# Patient Record
Sex: Male | Born: 1960
Health system: Southern US, Community
[De-identification: ages and names within clinical notes are randomized; demographics above are authoritative.]

## PROBLEM LIST (undated history)

## (undated) DIAGNOSIS — R39198 Other difficulties with micturition: Secondary | ICD-10-CM

## (undated) DIAGNOSIS — K645 Perianal venous thrombosis: Secondary | ICD-10-CM

## (undated) DIAGNOSIS — K6289 Other specified diseases of anus and rectum: Secondary | ICD-10-CM

## (undated) DIAGNOSIS — K219 Gastro-esophageal reflux disease without esophagitis: Secondary | ICD-10-CM

## (undated) DIAGNOSIS — R0981 Nasal congestion: Secondary | ICD-10-CM

## (undated) DIAGNOSIS — T7840XA Allergy, unspecified, initial encounter: Secondary | ICD-10-CM

## (undated) DIAGNOSIS — M7989 Other specified soft tissue disorders: Secondary | ICD-10-CM

## (undated) DIAGNOSIS — K921 Melena: Secondary | ICD-10-CM

## (undated) DIAGNOSIS — G4730 Sleep apnea, unspecified: Secondary | ICD-10-CM

## (undated) DIAGNOSIS — G930 Cerebral cysts: Secondary | ICD-10-CM

## (undated) DIAGNOSIS — E785 Hyperlipidemia, unspecified: Secondary | ICD-10-CM

## (undated) DIAGNOSIS — K626 Ulcer of anus and rectum: Secondary | ICD-10-CM

## (undated) DIAGNOSIS — R112 Nausea with vomiting, unspecified: Secondary | ICD-10-CM

## (undated) DIAGNOSIS — M1990 Unspecified osteoarthritis, unspecified site: Secondary | ICD-10-CM

## (undated) DIAGNOSIS — K922 Gastrointestinal hemorrhage, unspecified: Secondary | ICD-10-CM

## (undated) DIAGNOSIS — R51 Headache: Secondary | ICD-10-CM

## (undated) DIAGNOSIS — I639 Cerebral infarction, unspecified: Secondary | ICD-10-CM

## (undated) DIAGNOSIS — Z9889 Other specified postprocedural states: Secondary | ICD-10-CM

## (undated) DIAGNOSIS — I10 Essential (primary) hypertension: Secondary | ICD-10-CM

## (undated) DIAGNOSIS — R1310 Dysphagia, unspecified: Secondary | ICD-10-CM

## (undated) DIAGNOSIS — E119 Type 2 diabetes mellitus without complications: Secondary | ICD-10-CM

## (undated) DIAGNOSIS — K5900 Constipation, unspecified: Secondary | ICD-10-CM

## (undated) DIAGNOSIS — H9190 Unspecified hearing loss, unspecified ear: Secondary | ICD-10-CM

## (undated) HISTORY — DX: Type 2 diabetes mellitus without complications: E11.9

## (undated) HISTORY — DX: Essential (primary) hypertension: I10

## (undated) HISTORY — DX: Unspecified osteoarthritis, unspecified site: M19.90

## (undated) HISTORY — DX: Gastro-esophageal reflux disease without esophagitis: K21.9

## (undated) HISTORY — DX: Dysphagia, unspecified: R13.10

## (undated) HISTORY — DX: Other specified diseases of anus and rectum: K62.89

## (undated) HISTORY — DX: Headache: R51

## (undated) HISTORY — DX: Melena: K92.1

## (undated) HISTORY — DX: Other specified soft tissue disorders: M79.89

## (undated) HISTORY — PX: OTHER SURGICAL HISTORY: SHX169

## (undated) HISTORY — DX: Nasal congestion: R09.81

## (undated) HISTORY — DX: Perianal venous thrombosis: K64.5

## (undated) HISTORY — PX: CATARACT EXTRACTION: SUR2

## (undated) HISTORY — DX: Cerebral infarction, unspecified: I63.9

## (undated) HISTORY — DX: Constipation, unspecified: K59.00

## (undated) HISTORY — DX: Unspecified hearing loss, unspecified ear: H91.90

## (undated) HISTORY — DX: Hyperlipidemia, unspecified: E78.5

## (undated) HISTORY — DX: Allergy, unspecified, initial encounter: T78.40XA

## (undated) HISTORY — DX: Gastrointestinal hemorrhage, unspecified: K92.2

## (undated) HISTORY — DX: Sleep apnea, unspecified: G47.30

## (undated) HISTORY — DX: Other difficulties with micturition: R39.198

## (undated) HISTORY — PX: EYE SURGERY: SHX253

## (undated) HISTORY — PX: INNER EAR SURGERY: SHX679

---

## 1990-06-16 HISTORY — PX: INNER EAR SURGERY: SHX679

## 2003-11-22 ENCOUNTER — Emergency Department (HOSPITAL_COMMUNITY): Admission: EM | Admit: 2003-11-22 | Discharge: 2003-11-22 | Payer: Self-pay | Admitting: Emergency Medicine

## 2003-12-01 ENCOUNTER — Ambulatory Visit (HOSPITAL_BASED_OUTPATIENT_CLINIC_OR_DEPARTMENT_OTHER): Admission: RE | Admit: 2003-12-01 | Discharge: 2003-12-01 | Payer: Self-pay | Admitting: Urology

## 2003-12-27 ENCOUNTER — Encounter: Admission: RE | Admit: 2003-12-27 | Discharge: 2003-12-27 | Payer: Self-pay | Admitting: Sports Medicine

## 2004-06-04 ENCOUNTER — Ambulatory Visit: Payer: Self-pay | Admitting: Family Medicine

## 2004-06-06 ENCOUNTER — Encounter: Admission: RE | Admit: 2004-06-06 | Discharge: 2004-06-06 | Payer: Self-pay | Admitting: Family Medicine

## 2004-09-03 ENCOUNTER — Ambulatory Visit: Payer: Self-pay | Admitting: Family Medicine

## 2004-12-12 ENCOUNTER — Ambulatory Visit: Payer: Self-pay | Admitting: Family Medicine

## 2005-02-06 ENCOUNTER — Ambulatory Visit: Payer: Self-pay | Admitting: Family Medicine

## 2005-03-27 ENCOUNTER — Ambulatory Visit: Payer: Self-pay | Admitting: Family Medicine

## 2005-04-09 ENCOUNTER — Ambulatory Visit: Payer: Self-pay | Admitting: Family Medicine

## 2005-05-01 ENCOUNTER — Ambulatory Visit: Payer: Self-pay | Admitting: Family Medicine

## 2005-06-26 ENCOUNTER — Ambulatory Visit: Payer: Self-pay | Admitting: Family Medicine

## 2005-06-26 ENCOUNTER — Ambulatory Visit (HOSPITAL_COMMUNITY): Admission: RE | Admit: 2005-06-26 | Discharge: 2005-06-26 | Payer: Self-pay | Admitting: Family Medicine

## 2005-06-30 ENCOUNTER — Ambulatory Visit (HOSPITAL_COMMUNITY): Admission: RE | Admit: 2005-06-30 | Discharge: 2005-06-30 | Payer: Self-pay | Admitting: Family Medicine

## 2005-08-14 ENCOUNTER — Ambulatory Visit (HOSPITAL_COMMUNITY): Admission: RE | Admit: 2005-08-14 | Discharge: 2005-08-14 | Payer: Self-pay | Admitting: Neurology

## 2005-10-09 ENCOUNTER — Ambulatory Visit (HOSPITAL_COMMUNITY): Admission: RE | Admit: 2005-10-09 | Discharge: 2005-10-09 | Payer: Self-pay | Admitting: Neurosurgery

## 2005-10-16 ENCOUNTER — Ambulatory Visit: Payer: Self-pay | Admitting: Family Medicine

## 2005-10-25 ENCOUNTER — Ambulatory Visit (HOSPITAL_COMMUNITY): Admission: RE | Admit: 2005-10-25 | Discharge: 2005-10-25 | Payer: Self-pay | Admitting: Neurology

## 2005-12-03 ENCOUNTER — Ambulatory Visit: Payer: Self-pay | Admitting: Family Medicine

## 2006-01-08 ENCOUNTER — Ambulatory Visit: Payer: Self-pay | Admitting: Family Medicine

## 2006-02-03 ENCOUNTER — Ambulatory Visit: Payer: Self-pay | Admitting: Family Medicine

## 2006-02-26 ENCOUNTER — Ambulatory Visit: Payer: Self-pay | Admitting: Family Medicine

## 2006-03-13 ENCOUNTER — Ambulatory Visit (HOSPITAL_COMMUNITY): Admission: RE | Admit: 2006-03-13 | Discharge: 2006-03-13 | Payer: Self-pay | Admitting: Otolaryngology

## 2006-03-26 ENCOUNTER — Ambulatory Visit: Payer: Self-pay | Admitting: Family Medicine

## 2006-03-27 LAB — CONVERTED CEMR LAB
Hgb A1c MFr Bld: 5.6 % (ref 4.6–6.0)
Testosterone, total: 4.5052 ng/mL

## 2006-04-06 ENCOUNTER — Ambulatory Visit (HOSPITAL_COMMUNITY): Admission: RE | Admit: 2006-04-06 | Discharge: 2006-04-06 | Payer: Self-pay | Admitting: Neurosurgery

## 2006-05-05 ENCOUNTER — Ambulatory Visit: Payer: Self-pay | Admitting: Internal Medicine

## 2006-05-27 ENCOUNTER — Ambulatory Visit: Payer: Self-pay | Admitting: Family Medicine

## 2006-08-13 DIAGNOSIS — N200 Calculus of kidney: Secondary | ICD-10-CM | POA: Insufficient documentation

## 2006-08-13 HISTORY — DX: Calculus of kidney: N20.0

## 2006-09-17 ENCOUNTER — Ambulatory Visit: Payer: Self-pay | Admitting: Family Medicine

## 2006-10-08 ENCOUNTER — Ambulatory Visit (HOSPITAL_COMMUNITY): Admission: RE | Admit: 2006-10-08 | Discharge: 2006-10-08 | Payer: Self-pay | Admitting: Neurosurgery

## 2006-10-29 ENCOUNTER — Ambulatory Visit: Payer: Self-pay | Admitting: Family Medicine

## 2006-11-05 ENCOUNTER — Ambulatory Visit: Payer: Self-pay | Admitting: Family Medicine

## 2006-12-23 ENCOUNTER — Encounter: Payer: Self-pay | Admitting: Family Medicine

## 2006-12-23 DIAGNOSIS — J310 Chronic rhinitis: Secondary | ICD-10-CM | POA: Insufficient documentation

## 2006-12-23 DIAGNOSIS — R51 Headache: Secondary | ICD-10-CM | POA: Insufficient documentation

## 2006-12-23 DIAGNOSIS — J309 Allergic rhinitis, unspecified: Secondary | ICD-10-CM | POA: Insufficient documentation

## 2006-12-23 DIAGNOSIS — R519 Headache, unspecified: Secondary | ICD-10-CM

## 2006-12-23 HISTORY — DX: Headache, unspecified: R51.9

## 2006-12-23 HISTORY — DX: Chronic rhinitis: J31.0

## 2006-12-31 ENCOUNTER — Ambulatory Visit: Payer: Self-pay | Admitting: Family Medicine

## 2007-01-03 ENCOUNTER — Ambulatory Visit (HOSPITAL_BASED_OUTPATIENT_CLINIC_OR_DEPARTMENT_OTHER): Admission: RE | Admit: 2007-01-03 | Discharge: 2007-01-03 | Payer: Self-pay | Admitting: Family Medicine

## 2007-01-03 ENCOUNTER — Encounter: Payer: Self-pay | Admitting: Family Medicine

## 2007-01-05 ENCOUNTER — Encounter: Payer: Self-pay | Admitting: Family Medicine

## 2007-01-08 ENCOUNTER — Ambulatory Visit (HOSPITAL_COMMUNITY): Admission: RE | Admit: 2007-01-08 | Discharge: 2007-01-08 | Payer: Self-pay | Admitting: Specialist

## 2007-01-20 ENCOUNTER — Telehealth: Payer: Self-pay | Admitting: Family Medicine

## 2007-01-27 ENCOUNTER — Ambulatory Visit: Payer: Self-pay | Admitting: Pulmonary Disease

## 2007-01-28 ENCOUNTER — Ambulatory Visit: Payer: Self-pay | Admitting: Pulmonary Disease

## 2007-02-03 ENCOUNTER — Telehealth: Payer: Self-pay | Admitting: Family Medicine

## 2007-02-19 DIAGNOSIS — G4710 Hypersomnia, unspecified: Secondary | ICD-10-CM

## 2007-02-19 HISTORY — DX: Hypersomnia, unspecified: G47.10

## 2007-05-18 ENCOUNTER — Telehealth: Payer: Self-pay | Admitting: Family Medicine

## 2007-07-20 ENCOUNTER — Ambulatory Visit: Payer: Self-pay | Admitting: Family Medicine

## 2007-07-20 DIAGNOSIS — M549 Dorsalgia, unspecified: Secondary | ICD-10-CM

## 2007-07-20 DIAGNOSIS — M545 Low back pain, unspecified: Secondary | ICD-10-CM

## 2007-07-20 HISTORY — DX: Low back pain, unspecified: M54.50

## 2007-08-05 ENCOUNTER — Ambulatory Visit: Payer: Self-pay | Admitting: Family Medicine

## 2007-08-05 DIAGNOSIS — T50995A Adverse effect of other drugs, medicaments and biological substances, initial encounter: Secondary | ICD-10-CM | POA: Insufficient documentation

## 2007-08-05 DIAGNOSIS — E299 Testicular dysfunction, unspecified: Secondary | ICD-10-CM | POA: Insufficient documentation

## 2007-08-05 DIAGNOSIS — E039 Hypothyroidism, unspecified: Secondary | ICD-10-CM | POA: Insufficient documentation

## 2007-08-05 DIAGNOSIS — E348 Other specified endocrine disorders: Secondary | ICD-10-CM

## 2007-08-05 DIAGNOSIS — Z87898 Personal history of other specified conditions: Secondary | ICD-10-CM | POA: Insufficient documentation

## 2007-08-05 HISTORY — DX: Other specified endocrine disorders: E34.8

## 2007-08-05 LAB — CONVERTED CEMR LAB
Bilirubin Urine: NEGATIVE
Blood in Urine, dipstick: NEGATIVE
Glucose, Urine, Semiquant: NEGATIVE
Ketones, urine, test strip: NEGATIVE
Nitrite: NEGATIVE
Protein, U semiquant: NEGATIVE
Specific Gravity, Urine: 1.02
Urobilinogen, UA: 0.2
WBC Urine, dipstick: NEGATIVE
pH: 6.5

## 2007-08-09 ENCOUNTER — Telehealth: Payer: Self-pay | Admitting: Family Medicine

## 2007-08-11 LAB — CONVERTED CEMR LAB
ALT: 17 units/L (ref 0–53)
AST: 18 units/L (ref 0–37)
Albumin: 4.3 g/dL (ref 3.5–5.2)
Alkaline Phosphatase: 71 units/L (ref 39–117)
BUN: 18 mg/dL (ref 6–23)
Basophils Absolute: 0 10*3/uL (ref 0.0–0.1)
Basophils Relative: 0.3 % (ref 0.0–1.0)
Bilirubin, Direct: 0.1 mg/dL (ref 0.0–0.3)
CO2: 29 meq/L (ref 19–32)
Calcium: 9.9 mg/dL (ref 8.4–10.5)
Chloride: 99 meq/L (ref 96–112)
Cholesterol: 201 mg/dL (ref 0–200)
Creatinine, Ser: 1.4 mg/dL (ref 0.4–1.5)
Direct LDL: 140.6 mg/dL
Eosinophils Absolute: 0.2 10*3/uL (ref 0.0–0.6)
Eosinophils Relative: 1.8 % (ref 0.0–5.0)
GFR calc Af Amer: 70 mL/min
GFR calc non Af Amer: 58 mL/min
Glucose, Bld: 93 mg/dL (ref 70–99)
HCT: 46.3 % (ref 39.0–52.0)
HDL: 32.1 mg/dL — ABNORMAL LOW (ref >39.0)
Hemoglobin: 15.1 g/dL (ref 13.0–17.0)
Lymphocytes Relative: 20.8 % (ref 12.0–46.0)
MCHC: 32.5 g/dL (ref 30.0–36.0)
MCV: 90.5 fL (ref 78.0–100.0)
Monocytes Absolute: 0.8 10*3/uL — ABNORMAL HIGH (ref 0.2–0.7)
Monocytes Relative: 7.7 % (ref 3.0–11.0)
Neutro Abs: 6.8 10*3/uL (ref 1.4–7.7)
Neutrophils Relative %: 69.4 % (ref 43.0–77.0)
PSA: 0.56 ng/mL (ref 0.10–4.00)
Platelets: 375 10*3/uL (ref 150–400)
Potassium: 4.4 meq/L (ref 3.5–5.1)
RBC: 5.11 M/uL (ref 4.22–5.81)
RDW: 13.4 % (ref 11.5–14.6)
Sodium: 136 meq/L (ref 135–145)
TSH: 4.04 microintl units/mL (ref 0.35–5.50)
Testosterone: 398.95 ng/dL (ref 350.00–890)
Total Bilirubin: 1 mg/dL (ref 0.3–1.2)
Total CHOL/HDL Ratio: 6.3
Total Protein: 7.4 g/dL (ref 6.0–8.3)
Triglycerides: 75 mg/dL (ref 0–149)
VLDL: 15 mg/dL (ref 0–40)
WBC: 9.8 10*3/uL (ref 4.5–10.5)

## 2007-09-01 ENCOUNTER — Encounter: Payer: Self-pay | Admitting: Family Medicine

## 2007-10-20 ENCOUNTER — Ambulatory Visit (HOSPITAL_COMMUNITY): Admission: RE | Admit: 2007-10-20 | Discharge: 2007-10-20 | Payer: Self-pay | Admitting: Neurosurgery

## 2007-12-09 ENCOUNTER — Telehealth: Payer: Self-pay | Admitting: Family Medicine

## 2007-12-09 ENCOUNTER — Ambulatory Visit: Payer: Self-pay | Admitting: Family Medicine

## 2007-12-09 DIAGNOSIS — R5383 Other fatigue: Secondary | ICD-10-CM

## 2007-12-09 DIAGNOSIS — F341 Dysthymic disorder: Secondary | ICD-10-CM

## 2007-12-09 DIAGNOSIS — R5381 Other malaise: Secondary | ICD-10-CM | POA: Insufficient documentation

## 2007-12-09 DIAGNOSIS — K219 Gastro-esophageal reflux disease without esophagitis: Secondary | ICD-10-CM | POA: Insufficient documentation

## 2007-12-09 HISTORY — DX: Dysthymic disorder: F34.1

## 2007-12-09 HISTORY — DX: Other fatigue: R53.83

## 2007-12-09 HISTORY — DX: Gastro-esophageal reflux disease without esophagitis: K21.9

## 2007-12-09 HISTORY — DX: Other malaise: R53.81

## 2007-12-09 LAB — CONVERTED CEMR LAB
Bilirubin Urine: NEGATIVE
Blood in Urine, dipstick: NEGATIVE
Glucose, Urine, Semiquant: NEGATIVE
Ketones, urine, test strip: NEGATIVE
Nitrite: NEGATIVE
Protein, U semiquant: NEGATIVE
Specific Gravity, Urine: >=1.03
Urobilinogen, UA: 0.2
WBC Urine, dipstick: NEGATIVE
pH: 5.5

## 2007-12-13 LAB — CONVERTED CEMR LAB: Testosterone: 413.03 ng/dL (ref 350.00–890)

## 2007-12-16 ENCOUNTER — Telehealth: Payer: Self-pay | Admitting: Family Medicine

## 2008-01-11 ENCOUNTER — Ambulatory Visit: Payer: Self-pay | Admitting: Family Medicine

## 2008-01-11 DIAGNOSIS — M129 Arthropathy, unspecified: Secondary | ICD-10-CM | POA: Insufficient documentation

## 2008-01-13 ENCOUNTER — Telehealth: Payer: Self-pay

## 2008-03-23 ENCOUNTER — Ambulatory Visit: Payer: Self-pay | Admitting: Family Medicine

## 2008-03-23 DIAGNOSIS — K6289 Other specified diseases of anus and rectum: Secondary | ICD-10-CM | POA: Insufficient documentation

## 2008-04-13 ENCOUNTER — Ambulatory Visit: Payer: Self-pay | Admitting: Family Medicine

## 2008-07-06 ENCOUNTER — Ambulatory Visit: Payer: Self-pay | Admitting: Family Medicine

## 2008-07-26 ENCOUNTER — Telehealth: Payer: Self-pay | Admitting: Family Medicine

## 2008-08-02 ENCOUNTER — Telehealth: Payer: Self-pay | Admitting: Family Medicine

## 2008-08-17 ENCOUNTER — Telehealth: Payer: Self-pay | Admitting: Family Medicine

## 2008-08-21 ENCOUNTER — Ambulatory Visit: Payer: Self-pay | Admitting: Family Medicine

## 2008-08-22 ENCOUNTER — Ambulatory Visit: Payer: Self-pay | Admitting: Family Medicine

## 2008-08-23 ENCOUNTER — Ambulatory Visit: Payer: Self-pay | Admitting: Family Medicine

## 2008-10-25 ENCOUNTER — Ambulatory Visit: Payer: Self-pay | Admitting: Family Medicine

## 2008-10-26 ENCOUNTER — Ambulatory Visit (HOSPITAL_COMMUNITY): Admission: RE | Admit: 2008-10-26 | Discharge: 2008-10-26 | Payer: Self-pay | Admitting: Neurosurgery

## 2008-11-01 ENCOUNTER — Ambulatory Visit: Payer: Self-pay | Admitting: Family Medicine

## 2008-11-01 LAB — CONVERTED CEMR LAB
ALT: 25 units/L (ref 0–53)
AST: 23 units/L (ref 0–37)
Albumin: 3.9 g/dL (ref 3.5–5.2)
Alkaline Phosphatase: 64 units/L (ref 39–117)
BUN: 15 mg/dL (ref 6–23)
Basophils Absolute: 0 10*3/uL (ref 0.0–0.1)
Basophils Relative: 0.1 % (ref 0.0–3.0)
Bilirubin Urine: NEGATIVE
Bilirubin, Direct: 0.1 mg/dL (ref 0.0–0.3)
CO2: 32 meq/L (ref 19–32)
Calcium: 9 mg/dL (ref 8.4–10.5)
Chloride: 107 meq/L (ref 96–112)
Cholesterol: 182 mg/dL (ref 0–200)
Creatinine, Ser: 1.3 mg/dL (ref 0.4–1.5)
Eosinophils Absolute: 0.3 10*3/uL (ref 0.0–0.7)
Eosinophils Relative: 3 % (ref 0.0–5.0)
GFR calc non Af Amer: 62.58 mL/min (ref >60)
Glucose, Bld: 109 mg/dL — ABNORMAL HIGH (ref 70–99)
HCT: 41.6 % (ref 39.0–52.0)
HDL: 27.4 mg/dL — ABNORMAL LOW (ref >39.00)
Hemoglobin, Urine: NEGATIVE
Hemoglobin: 14.4 g/dL (ref 13.0–17.0)
Ketones, ur: NEGATIVE mg/dL
LDL Cholesterol: 131 mg/dL — ABNORMAL HIGH (ref 0–99)
Leukocytes, UA: NEGATIVE
Lymphocytes Relative: 20.1 % (ref 12.0–46.0)
Lymphs Abs: 1.8 10*3/uL (ref 0.7–4.0)
MCHC: 34.6 g/dL (ref 30.0–36.0)
MCV: 89.5 fL (ref 78.0–100.0)
Monocytes Absolute: 0.6 10*3/uL (ref 0.1–1.0)
Monocytes Relative: 6.2 % (ref 3.0–12.0)
Neutro Abs: 6.5 10*3/uL (ref 1.4–7.7)
Neutrophils Relative %: 70.6 % (ref 43.0–77.0)
Nitrite: NEGATIVE
PSA: 0.43 ng/mL (ref 0.10–4.00)
Platelets: 298 10*3/uL (ref 150.0–400.0)
Potassium: 4.3 meq/L (ref 3.5–5.1)
RBC: 4.65 M/uL (ref 4.22–5.81)
RDW: 12.8 % (ref 11.5–14.6)
Sodium: 144 meq/L (ref 135–145)
Specific Gravity, Urine: <=1.005 (ref 1.000–1.030)
TSH: 1.98 microintl units/mL (ref 0.35–5.50)
Total Bilirubin: 0.8 mg/dL (ref 0.3–1.2)
Total CHOL/HDL Ratio: 7
Total Protein, Urine: NEGATIVE mg/dL
Total Protein: 6.7 g/dL (ref 6.0–8.3)
Triglycerides: 119 mg/dL (ref 0.0–149.0)
Urine Glucose: NEGATIVE mg/dL
Urobilinogen, UA: 0.2 (ref 0.0–1.0)
VLDL: 23.8 mg/dL (ref 0.0–40.0)
WBC: 9.2 10*3/uL (ref 4.5–10.5)
pH: 6.5 (ref 5.0–8.0)

## 2008-11-28 ENCOUNTER — Encounter: Payer: Self-pay | Admitting: Family Medicine

## 2009-01-09 ENCOUNTER — Encounter (INDEPENDENT_AMBULATORY_CARE_PROVIDER_SITE_OTHER): Payer: Self-pay | Admitting: Neurology

## 2009-01-09 ENCOUNTER — Ambulatory Visit: Payer: Self-pay | Admitting: Vascular Surgery

## 2009-01-09 ENCOUNTER — Ambulatory Visit (HOSPITAL_COMMUNITY): Admission: RE | Admit: 2009-01-09 | Discharge: 2009-01-09 | Payer: Self-pay | Admitting: Neurology

## 2009-01-22 ENCOUNTER — Telehealth (INDEPENDENT_AMBULATORY_CARE_PROVIDER_SITE_OTHER): Payer: Self-pay | Admitting: *Deleted

## 2009-01-31 ENCOUNTER — Encounter (INDEPENDENT_AMBULATORY_CARE_PROVIDER_SITE_OTHER): Payer: Self-pay | Admitting: Neurology

## 2009-01-31 ENCOUNTER — Ambulatory Visit (HOSPITAL_COMMUNITY): Admission: RE | Admit: 2009-01-31 | Discharge: 2009-01-31 | Payer: Self-pay | Admitting: Neurology

## 2009-01-31 ENCOUNTER — Ambulatory Visit: Payer: Self-pay | Admitting: Internal Medicine

## 2009-02-02 ENCOUNTER — Ambulatory Visit: Payer: Self-pay | Admitting: Family Medicine

## 2009-02-02 DIAGNOSIS — IMO0001 Reserved for inherently not codable concepts without codable children: Secondary | ICD-10-CM | POA: Insufficient documentation

## 2009-02-05 ENCOUNTER — Telehealth: Payer: Self-pay | Admitting: Family Medicine

## 2009-02-08 ENCOUNTER — Ambulatory Visit: Payer: Self-pay | Admitting: Family Medicine

## 2009-02-13 ENCOUNTER — Telehealth: Payer: Self-pay | Admitting: Family Medicine

## 2009-02-14 ENCOUNTER — Ambulatory Visit: Payer: Self-pay | Admitting: Family Medicine

## 2009-02-14 DIAGNOSIS — N508 Other specified disorders of male genital organs: Secondary | ICD-10-CM | POA: Insufficient documentation

## 2009-02-22 ENCOUNTER — Emergency Department (HOSPITAL_COMMUNITY): Admission: EM | Admit: 2009-02-22 | Discharge: 2009-02-22 | Payer: Self-pay | Admitting: Emergency Medicine

## 2009-03-01 ENCOUNTER — Ambulatory Visit: Payer: Self-pay | Admitting: Family Medicine

## 2009-05-02 ENCOUNTER — Ambulatory Visit: Payer: Self-pay | Admitting: Family Medicine

## 2009-05-03 ENCOUNTER — Encounter: Payer: Self-pay | Admitting: Family Medicine

## 2009-05-09 ENCOUNTER — Telehealth: Payer: Self-pay | Admitting: Family Medicine

## 2009-10-04 ENCOUNTER — Ambulatory Visit: Payer: Self-pay | Admitting: Family Medicine

## 2009-10-04 DIAGNOSIS — H6980 Other specified disorders of Eustachian tube, unspecified ear: Secondary | ICD-10-CM | POA: Insufficient documentation

## 2009-10-16 ENCOUNTER — Ambulatory Visit (HOSPITAL_COMMUNITY): Admission: RE | Admit: 2009-10-16 | Discharge: 2009-10-16 | Payer: Self-pay | Admitting: Neurosurgery

## 2009-10-22 ENCOUNTER — Encounter: Payer: Self-pay | Admitting: Family Medicine

## 2009-11-22 ENCOUNTER — Ambulatory Visit: Payer: Self-pay | Admitting: Family Medicine

## 2009-11-22 LAB — CONVERTED CEMR LAB
Bilirubin Urine: NEGATIVE
Blood in Urine, dipstick: NEGATIVE
Glucose, Urine, Semiquant: NEGATIVE
Ketones, urine, test strip: NEGATIVE
Nitrite: NEGATIVE
Protein, U semiquant: NEGATIVE
Specific Gravity, Urine: 1.025
Urobilinogen, UA: 0.2
WBC Urine, dipstick: NEGATIVE
pH: 6.5

## 2010-02-04 ENCOUNTER — Ambulatory Visit: Payer: Self-pay | Admitting: Family Medicine

## 2010-02-04 ENCOUNTER — Telehealth (INDEPENDENT_AMBULATORY_CARE_PROVIDER_SITE_OTHER): Payer: Self-pay | Admitting: *Deleted

## 2010-02-04 DIAGNOSIS — R1013 Epigastric pain: Secondary | ICD-10-CM | POA: Insufficient documentation

## 2010-02-04 DIAGNOSIS — R1319 Other dysphagia: Secondary | ICD-10-CM | POA: Insufficient documentation

## 2010-02-04 DIAGNOSIS — I10 Essential (primary) hypertension: Secondary | ICD-10-CM | POA: Insufficient documentation

## 2010-02-04 DIAGNOSIS — R0789 Other chest pain: Secondary | ICD-10-CM | POA: Insufficient documentation

## 2010-02-04 HISTORY — DX: Essential (primary) hypertension: I10

## 2010-02-05 ENCOUNTER — Telehealth: Payer: Self-pay | Admitting: Family Medicine

## 2010-02-05 ENCOUNTER — Encounter: Payer: Self-pay | Admitting: Gastroenterology

## 2010-02-05 LAB — CONVERTED CEMR LAB
ALT: 17 units/L (ref 0–53)
AST: 17 units/L (ref 0–37)
Albumin: 4.5 g/dL (ref 3.5–5.2)
Alkaline Phosphatase: 66 units/L (ref 39–117)
BUN: 15 mg/dL (ref 6–23)
Basophils Absolute: 0.1 10*3/uL (ref 0.0–0.1)
Basophils Relative: 0.6 % (ref 0.0–3.0)
Bilirubin, Direct: 0.2 mg/dL (ref 0.0–0.3)
CO2: 29 meq/L (ref 19–32)
Calcium: 9.4 mg/dL (ref 8.4–10.5)
Chloride: 104 meq/L (ref 96–112)
Creatinine, Ser: 1.2 mg/dL (ref 0.4–1.5)
Eosinophils Absolute: 0.3 10*3/uL (ref 0.0–0.7)
Eosinophils Relative: 2.4 % (ref 0.0–5.0)
GFR calc non Af Amer: 68.27 mL/min (ref >60)
Glucose, Bld: 94 mg/dL (ref 70–99)
H Pylori IgG: NEGATIVE
HCT: 44.2 % (ref 39.0–52.0)
Hemoglobin: 15.1 g/dL (ref 13.0–17.0)
Lymphocytes Relative: 20.6 % (ref 12.0–46.0)
Lymphs Abs: 2.2 10*3/uL (ref 0.7–4.0)
MCHC: 34.3 g/dL (ref 30.0–36.0)
MCV: 90.2 fL (ref 78.0–100.0)
Monocytes Absolute: 0.2 10*3/uL (ref 0.1–1.0)
Monocytes Relative: 1.5 % — ABNORMAL LOW (ref 3.0–12.0)
Neutro Abs: 8 10*3/uL — ABNORMAL HIGH (ref 1.4–7.7)
Neutrophils Relative %: 74.9 % (ref 43.0–77.0)
Platelets: 316 10*3/uL (ref 150.0–400.0)
Potassium: 3.9 meq/L (ref 3.5–5.1)
RBC: 4.9 M/uL (ref 4.22–5.81)
RDW: 13.4 % (ref 11.5–14.6)
Sodium: 143 meq/L (ref 135–145)
TSH: 2.88 microintl units/mL (ref 0.35–5.50)
Total Bilirubin: 0.8 mg/dL (ref 0.3–1.2)
Total Protein: 7.2 g/dL (ref 6.0–8.3)
WBC: 10.7 10*3/uL — ABNORMAL HIGH (ref 4.5–10.5)

## 2010-02-07 ENCOUNTER — Ambulatory Visit: Payer: Self-pay | Admitting: Gastroenterology

## 2010-02-11 ENCOUNTER — Telehealth (INDEPENDENT_AMBULATORY_CARE_PROVIDER_SITE_OTHER): Payer: Self-pay | Admitting: *Deleted

## 2010-02-12 ENCOUNTER — Ambulatory Visit: Payer: Self-pay | Admitting: Cardiology

## 2010-02-12 ENCOUNTER — Encounter (HOSPITAL_COMMUNITY): Admission: RE | Admit: 2010-02-12 | Discharge: 2010-03-05 | Payer: Self-pay | Admitting: Family Medicine

## 2010-02-12 ENCOUNTER — Encounter: Payer: Self-pay | Admitting: Cardiology

## 2010-02-12 ENCOUNTER — Ambulatory Visit: Payer: Self-pay

## 2010-02-14 ENCOUNTER — Telehealth (INDEPENDENT_AMBULATORY_CARE_PROVIDER_SITE_OTHER): Payer: Self-pay | Admitting: *Deleted

## 2010-02-14 ENCOUNTER — Encounter (INDEPENDENT_AMBULATORY_CARE_PROVIDER_SITE_OTHER): Payer: Self-pay | Admitting: *Deleted

## 2010-02-15 ENCOUNTER — Telehealth (INDEPENDENT_AMBULATORY_CARE_PROVIDER_SITE_OTHER): Payer: Self-pay | Admitting: *Deleted

## 2010-02-22 ENCOUNTER — Encounter: Payer: Self-pay | Admitting: Family Medicine

## 2010-03-14 ENCOUNTER — Encounter: Payer: Self-pay | Admitting: Family Medicine

## 2010-03-26 ENCOUNTER — Ambulatory Visit: Payer: Self-pay | Admitting: Family Medicine

## 2010-03-26 LAB — CONVERTED CEMR LAB
Bilirubin Urine: NEGATIVE
Blood in Urine, dipstick: NEGATIVE
Glucose, Urine, Semiquant: NEGATIVE
Ketones, urine, test strip: NEGATIVE
Nitrite: NEGATIVE
Protein, U semiquant: NEGATIVE
Specific Gravity, Urine: 1.02
Urobilinogen, UA: 0.2
WBC Urine, dipstick: NEGATIVE
pH: 7

## 2010-03-27 ENCOUNTER — Telehealth (INDEPENDENT_AMBULATORY_CARE_PROVIDER_SITE_OTHER): Payer: Self-pay | Admitting: *Deleted

## 2010-03-27 LAB — CONVERTED CEMR LAB
ALT: 19 units/L (ref 0–53)
AST: 21 units/L (ref 0–37)
Albumin: 4.5 g/dL (ref 3.5–5.2)
Alkaline Phosphatase: 75 units/L (ref 39–117)
BUN: 14 mg/dL (ref 6–23)
Basophils Absolute: 0 10*3/uL (ref 0.0–0.1)
Basophils Relative: 0.2 % (ref 0.0–3.0)
Bilirubin, Direct: 0.1 mg/dL (ref 0.0–0.3)
CO2: 32 meq/L (ref 19–32)
Calcium: 9.6 mg/dL (ref 8.4–10.5)
Chloride: 99 meq/L (ref 96–112)
Creatinine, Ser: 1.1 mg/dL (ref 0.4–1.5)
Eosinophils Absolute: 0.3 10*3/uL (ref 0.0–0.7)
Eosinophils Relative: 2.7 % (ref 0.0–5.0)
GFR calc non Af Amer: 75.44 mL/min (ref >60)
Glucose, Bld: 93 mg/dL (ref 70–99)
HCT: 44.1 % (ref 39.0–52.0)
Hemoglobin: 15.3 g/dL (ref 13.0–17.0)
Lymphocytes Relative: 24.3 % (ref 12.0–46.0)
Lymphs Abs: 2.5 10*3/uL (ref 0.7–4.0)
MCHC: 34.7 g/dL (ref 30.0–36.0)
MCV: 88.7 fL (ref 78.0–100.0)
Monocytes Absolute: 0.7 10*3/uL (ref 0.1–1.0)
Monocytes Relative: 7 % (ref 3.0–12.0)
Neutro Abs: 6.9 10*3/uL (ref 1.4–7.7)
Neutrophils Relative %: 65.8 % (ref 43.0–77.0)
PSA: 1.14 ng/mL (ref 0.10–4.00)
Phosphorus: 3.8 mg/dL (ref 2.3–4.6)
Platelets: 329 10*3/uL (ref 150.0–400.0)
Potassium: 4.2 meq/L (ref 3.5–5.1)
RBC: 4.97 M/uL (ref 4.22–5.81)
RDW: 13.3 % (ref 11.5–14.6)
Sed Rate: 9 mm/hr (ref 0–22)
Sodium: 139 meq/L (ref 135–145)
Total Bilirubin: 0.8 mg/dL (ref 0.3–1.2)
Total Protein: 7.4 g/dL (ref 6.0–8.3)
WBC: 10.5 10*3/uL (ref 4.5–10.5)

## 2010-04-26 ENCOUNTER — Ambulatory Visit: Payer: Self-pay | Admitting: Family Medicine

## 2010-05-23 ENCOUNTER — Ambulatory Visit: Payer: Self-pay | Admitting: Family Medicine

## 2010-05-23 ENCOUNTER — Telehealth: Payer: Self-pay | Admitting: Family Medicine

## 2010-06-05 ENCOUNTER — Ambulatory Visit: Payer: Self-pay | Admitting: Family Medicine

## 2010-06-06 ENCOUNTER — Ambulatory Visit: Payer: Self-pay | Admitting: Internal Medicine

## 2010-06-06 ENCOUNTER — Ambulatory Visit: Payer: Self-pay | Admitting: Family Medicine

## 2010-06-06 DIAGNOSIS — K922 Gastrointestinal hemorrhage, unspecified: Secondary | ICD-10-CM | POA: Insufficient documentation

## 2010-06-06 LAB — CONVERTED CEMR LAB
Bilirubin Urine: NEGATIVE
Blood in Urine, dipstick: NEGATIVE
Glucose, Urine, Semiquant: NEGATIVE
Ketones, urine, test strip: NEGATIVE
Nitrite: NEGATIVE
Protein, U semiquant: NEGATIVE
Specific Gravity, Urine: 1.015
Urobilinogen, UA: 0.2
WBC Urine, dipstick: NEGATIVE
pH: 7

## 2010-06-07 ENCOUNTER — Telehealth: Payer: Self-pay

## 2010-06-12 LAB — CONVERTED CEMR LAB
ALT: 16 units/L (ref 0–53)
AST: 16 units/L (ref 0–37)
Albumin: 4.3 g/dL (ref 3.5–5.2)
Alkaline Phosphatase: 75 units/L (ref 39–117)
BUN: 19 mg/dL (ref 6–23)
Basophils Absolute: 0 10*3/uL (ref 0.0–0.1)
Basophils Relative: 0.1 % (ref 0.0–3.0)
Bilirubin, Direct: 0.1 mg/dL (ref 0.0–0.3)
CO2: 31 meq/L (ref 19–32)
Calcium: 9.6 mg/dL (ref 8.4–10.5)
Chloride: 101 meq/L (ref 96–112)
Cholesterol: 182 mg/dL (ref 0–200)
Creatinine, Ser: 1.3 mg/dL (ref 0.4–1.5)
Eosinophils Absolute: 0.2 10*3/uL (ref 0.0–0.7)
Eosinophils Relative: 1.9 % (ref 0.0–5.0)
GFR calc non Af Amer: 64.44 mL/min (ref >60.00)
Glucose, Bld: 83 mg/dL (ref 70–99)
HCT: 44 % (ref 39.0–52.0)
HDL: 31.2 mg/dL — ABNORMAL LOW (ref >39.00)
Hemoglobin: 14.8 g/dL (ref 13.0–17.0)
LDL Cholesterol: 133 mg/dL — ABNORMAL HIGH (ref 0–99)
Lymphocytes Relative: 19.7 % (ref 12.0–46.0)
Lymphs Abs: 2 10*3/uL (ref 0.7–4.0)
MCHC: 33.6 g/dL (ref 30.0–36.0)
MCV: 90.2 fL (ref 78.0–100.0)
Monocytes Absolute: 0.7 10*3/uL (ref 0.1–1.0)
Monocytes Relative: 7.2 % (ref 3.0–12.0)
Neutro Abs: 7.2 10*3/uL (ref 1.4–7.7)
Neutrophils Relative %: 71.1 % (ref 43.0–77.0)
Platelets: 309 10*3/uL (ref 150.0–400.0)
Potassium: 4.5 meq/L (ref 3.5–5.1)
RBC: 4.87 M/uL (ref 4.22–5.81)
RDW: 13.8 % (ref 11.5–14.6)
Sodium: 141 meq/L (ref 135–145)
TSH: 4.95 microintl units/mL (ref 0.35–5.50)
Total Bilirubin: 0.5 mg/dL (ref 0.3–1.2)
Total CHOL/HDL Ratio: 6
Total Protein: 7.2 g/dL (ref 6.0–8.3)
Triglycerides: 91 mg/dL (ref 0.0–149.0)
VLDL: 18.2 mg/dL (ref 0.0–40.0)
WBC: 10.2 10*3/uL (ref 4.5–10.5)

## 2010-06-13 ENCOUNTER — Ambulatory Visit: Payer: Self-pay | Admitting: Family Medicine

## 2010-06-18 ENCOUNTER — Telehealth: Payer: Self-pay | Admitting: Family Medicine

## 2010-06-27 ENCOUNTER — Ambulatory Visit
Admission: RE | Admit: 2010-06-27 | Discharge: 2010-06-27 | Payer: Self-pay | Source: Home / Self Care | Attending: Internal Medicine | Admitting: Internal Medicine

## 2010-07-14 LAB — CONVERTED CEMR LAB
Bilirubin Urine: NEGATIVE
Blood in Urine, dipstick: NEGATIVE
Glucose, Urine, Semiquant: NEGATIVE
Ketones, urine, test strip: NEGATIVE
Nitrite: NEGATIVE
Specific Gravity, Urine: 1.015
Urobilinogen, UA: 0.2
WBC Urine, dipstick: NEGATIVE
pH: 7

## 2010-07-18 ENCOUNTER — Telehealth: Payer: Self-pay | Admitting: Family Medicine

## 2010-07-18 ENCOUNTER — Telehealth: Payer: Self-pay | Admitting: Gastroenterology

## 2010-07-18 DIAGNOSIS — K625 Hemorrhage of anus and rectum: Secondary | ICD-10-CM

## 2010-07-18 NOTE — Assessment & Plan Note (Signed)
Summary: FEELING DIZZY//SLM/PT RSC/CJR   Vital Signs:  Patient profile:   50 year old male Height:      69 inches (175.26 cm) Weight:      192.31 pounds (87.41 kg) O2 Sat:      99 % on Room air Temp:     97.6 degrees F (36.44 degrees C) oral Pulse rate:   98 / minute BP sitting:   148 / 102  (left arm) Cuff size:   regular  Vitals Entered By: Josph Macho RMA (March 26, 2010 2:28 PM)  O2 Flow:  Room air  CC: Feeling dizzy X2 weeks/ Flu shot today/ CF Is Patient Diabetic? No   History of Present Illness: Patient in today multiple complaints. He is complaining of feeling dizzy and vertiginous increasingly over the past 2 weeks. No falls but some sense of dysequilibrium when he walks it is noted. He does have ongoing headaches but these are not notably worse. No recent visual or other neurologic changes noted does have a history of pituitary adenoma his last MRI was in May of 2011. He is complaining of lower abdominal pain. Some increased difficulty with urinary flow and some dysuria. He also has some rectal irritation and swelling. Some frequent loose stool. He notes his blood pressures been up when he checks at home over the last couple of weeks. His vertiginous symptoms are worse when he stands quickly when he is walking or bending over. He is complaining of some sinus pressure popping and crackling in his years she is not pleased with the Nasarel says it caused too much burning and did not help with his stool and cramps this morning but no bloody or tarry stool. Denies any hematuria but has had some low-grade urinary frequency  Current Medications (verified): 1)  Diazepam 5 Mg Tabs (Diazepam) .Marland Kitchen.. 1 Tablet By Mouth Three Times A Day As Needed 2)  Omeprazole 40 Mg  Cpdr (Omeprazole) .Marland Kitchen.. 1 Two Times A Day For Genella Rife As Needed (Holding) 3)  Hydroxyzine Hcl 25 Mg  Tabs (Hydroxyzine Hcl) .Marland Kitchen.. 1 By Mouth Three Times A Day As Needed 4)  Naprosyn 125 Mg/79ml  Susp (Naproxen) .... 4 Tsp Am  and Pm As Needed 5)  Tylenol Codeine Liquid .Marland Kitchen.. 2-3 Tsp Q4h As Needed Pain 6)  Entex Liquid .... 2 Tsp Three Times A Day For Congestion As Needed 7)  Analpram-Hc Singles 1-2.5 % Crea (Hydrocortisone Ace-Pramoxine) .... Insert 1 Applicator Two Times A Day As Needed 8)  Nortriptyline Hcl 10 Mg/88ml Soln (Nortriptyline Hcl) .... 3 Tsp Hs 9)  Flunisolide 29 Mcg/act Soln (Flunisolide) .... 2 Sprays Each Nostril Two Times A Day 10)  Cortisporin 3.5-10000-1 Soln (Neomycin-Polymyxin-Hc) .... 4 Drops B/l Ears Three Times A Day X 10 Days 11)  Ranitidine Hcl 75 Mg/79ml Syrp (Ranitidine Hcl) .... 2 Tsp By Mouth Two Times A Day 12)  Nitrostat 0.4 Mg Subl (Nitroglycerin) .Marland Kitchen.. 1 Tab By Victorio Palm As Needed Cp Repeat Q X 3 Doses If Pain Is Persistent. If No Resolution After 3 Doses To Er  Allergies (verified): 1)  ! Seldane 2)  Amoxicillin (Amoxicillin)  Past History:  Past medical history reviewed for relevance to current acute and chronic problems. Social history (including risk factors) reviewed for relevance to current acute and chronic problems.  Past Medical History: Reviewed history from 02/07/2010 and no changes required. Allergic rhinitis Headache PITUITARY ADENOMA NEPROLETHIASIS Stroke Sleep Apnea pineal gland cyst  Social History: Reviewed history from 02/07/2010 and no changes  required. Patient exposed to 2nd hand smoke (heavy) Alcohol Use - no Illicit Drug Use - no  Review of Systems      See HPI       Flu Vaccine Consent Questions     Do you have a history of severe allergic reactions to this vaccine? no    Any prior history of allergic reactions to egg and/or gelatin? no    Do you have a sensitivity to the preservative Thimersol? no    Do you have a past history of Guillan-Barre Syndrome? no    Do you currently have an acute febrile illness? no    Have you ever had a severe reaction to latex? no    Vaccine information given and explained to patient? yes    Are you  currently pregnant? no    Lot Number:[id number]   Exp Date:12/14/2010   Site Given  Left Deltoid IM Josph Macho RMA  March 26, 2010 2:39 PM   Physical Exam  General:  Well-developed,well-nourished,in no acute distress; alert,appropriate and cooperative throughout examination Head:  Normocephalic and atraumatic without obvious abnormalities. No apparent alopecia or balding. Mouth:  Oral mucosa and oropharynx without lesions or exudates.  Teeth in good repair. Neck:  No deformities, masses, or tenderness noted. Lungs:  Normal respiratory effort, chest expands symmetrically. Lungs are clear to auscultation, no crackles or wheezes. Heart:  Normal rate and regular rhythm. S1 and S2 normal without gallop, murmur, click, rub or other extra sounds. Abdomen:  Bowel sounds positive,abdomen soft and non-tender without masses, organomegaly or hernias noted. Extremities:  No clubbing, cyanosis, edema, or deformity noted with normal full range of motion of all joints.   Psych:  flat affect, poor eye contact, and slightly anxious.     Impression & Recommendations:  Problem # 1:  ACUTE PROSTATITIS (ICD-601.0)  Orders: Venipuncture ([address]109) Specimen Handling (60454) TLB-Renal Function Panel (80069-RENAL) TLB-Hepatic/Liver Function Pnl (80076-HEPATIC) TLB-CBC Platelet - w/Differential (85025-CBCD) TLB-PSA (Prostate Specific Antigen) (84153-PSA) TLB-Sedimentation Rate (ESR) (85652-ESR) Ciprofloxacin 500mg  tab by mouth two times a day x 21 days, push clear fluids  Problem # 2:  ELEVATED BP READING WITHOUT DX HYPERTENSION (ICD-796.2)  His updated medication list for this problem includes:    Metoprolol Tartrate 25 Mg Tabs (Metoprolol tartrate) .Marland Kitchen... 1 tab by mouth by mouth two times a day, may crush in food reevaluate at next visit  Problem # 3:  OTHER DYSPHAGIA (ICD-787.29) Resolved, no complaints today, normal GI work up  Problem # 4:  PITUITARY ADENOMA (ICD-227.3) c/o feeling  increasingly light headed and vertiginous encouraged to follow up with neuro  Problem # 5:  CHEST PAIN, ATYPICAL (ICD-786.59) resolved  Problem # 6:  PROCTITIS (ICD-569.49) Anusol HC supp prn  Complete Medication List: 1)  Diazepam 5 Mg Tabs (Diazepam) .Marland Kitchen.. 1 tablet by mouth three times a day as needed 2)  Omeprazole 40 Mg Cpdr (Omeprazole) .Marland Kitchen.. 1 two times a day for gerd as needed (holding) 3)  Hydroxyzine Hcl 25 Mg Tabs (Hydroxyzine hcl) .Marland Kitchen.. 1 by mouth three times a day as needed 4)  Naprosyn 125 Mg/18ml Susp (Naproxen) .... 4 tsp am and pm as needed 5)  Tylenol Codeine Liquid  .Marland Kitchen.. 2-3 tsp q4h as needed pain 6)  Entex Liquid  .... 2 tsp three times a day for congestion as needed 7)  Analpram-hc Singles 1-2.5 % Crea (Hydrocortisone ace-pramoxine) .... Insert 1 applicator two times a day as needed 8)  Nortriptyline Hcl 10 Mg/46ml Soln (Nortriptyline hcl) .... 3  tsp hs 9)  Cortisporin 3.5-10000-1 Soln (Neomycin-polymyxin-hc) .... 4 drops b/l ears three times a day x 10 days 10)  Ranitidine Hcl 75 Mg/19ml Syrp (Ranitidine hcl) .... 2 tsp by mouth two times a day 11)  Nitrostat 0.4 Mg Subl (Nitroglycerin) .Marland Kitchen.. 1 tab by mouh sl as needed cp repeat q x 3 doses if pain is persistent. if no resolution after 3 doses to er 12)  Cipro 500 Mg Tabs (Ciprofloxacin hcl) .Marland Kitchen.. 1 tab by mouth two times a day x 21 days 13)  Metoprolol Tartrate 25 Mg Tabs (Metoprolol tartrate) .Marland Kitchen.. 1 tab by mouth by mouth two times a day, may crush in food 14)  Flonase 50 Mcg/act Susp (Fluticasone propionate) .... 2 sprays each nostril daily 15)  Anusol-hc 25 Mg Supp (Hydrocortisone acetate) .Marland Kitchen.. 1 pr two times a day x 7 days and then as needed rectal irritation after that  Other Orders: Admin 1st Vaccine (16109) Flu Vaccine 9yrs + (60454) UA Dipstick w/o Micro (automated)  (81003)  Patient Instructions: 1)  Please schedule a follow-up appointment in 1 to 2 months 2)  push clear fluids 3)  cleanse after BM with  tucks medicated pads and then use Anusol HC supp per rectum two times a day x 7 days and then as needed after that Prescriptions: ANUSOL-HC 25 MG SUPP (HYDROCORTISONE ACETATE) 1 pr two times a day x 7 days and then as needed rectal irritation after that  #30 x 1   Entered and Authorized by:   Danise Edge MD   Signed by:   Danise Edge MD on 03/26/2010   Method used:   [facility] to        [facility] [address] [phone]* (retail)       [address]       [place], [place]  [address]       Ph: [phone] or [phone]       Fax: [phone]   RxID:   [id number] FLONASE 50 MCG/ACT SUSP (FLUTICASONE PROPIONATE) 2 sprays each nostril daily  #1 bottle x 1   Entered and Authorized by:   Danise Edge MD   Signed by:   Danise Edge MD on 03/26/2010   Method used:   [facility]        [facility] [address] [phone]* (retail)       [address]       [place], [place]  [address]       Ph: [phone] or [phone]       Fax: [phone]   RxID:   [id number] METOPROLOL TARTRATE 25 MG TABS (METOPROLOL TARTRATE) 1 tab by mouth by mouth two times a day, may crush in food  #60 x 1   Entered and Authorized by:   Danise Edge MD   Signed by:   Danise Edge MD on 03/26/2010   Method used:   [facility]        [facility] [address] [phone]* (retail)       [address]       [place], [place]  [address]       Ph: [phone] or [phone]       Fax: [phone]   RxID:   [id number] CIPRO 500 MG TABS (CIPROFLOXACIN HCL) 1 tab by mouth two times a day x 21 days  #42 x 0   Entered and Authorized by:   Danise Edge MD   Signed by:   Danise Edge MD  on 03/26/2010   Method used:   [facility]        [facility] [address] [phone]* (retail)       [address]       [place], [place]  [address]       Ph: [phone] or [phone]       Fax: [phone]   RxID:   [id number]   Laboratory Results   Urine Tests    Routine Urinalysis   Color:  yellow Appearance: Clear Glucose: negative   (Normal Range: Negative) Bilirubin: negative   (Normal Range: Negative) Ketone: negative   (Normal Range: Negative) Spec. Gravity: 1.020   (Normal Range: 1.003-1.035) Blood: negative   (Normal Range: Negative) pH: 7.0   (Normal Range: 5.0-8.0) Protein: negative   (Normal Range: Negative) Urobilinogen: 0.2   (Normal Range: 0-1) Nitrite: negative   (Normal Range: Negative) Leukocyte Esterace: negative   (Normal Range: Negative)    Comments: Rita Ohara  March 26, 2010 4:35 PM

## 2010-07-18 NOTE — Progress Notes (Signed)
Summary: should he continue the cymbalta 60 mg   Phone Note Call from Patient Call back at Home Phone [phone]   Caller: pt vm triage Call For: [facility]  Summary of Call: Dr Scotty Court put him on Cymbalta today would be the first 60 mg capsule.  He has to break the capsule open and pour it on food.  The ringing in his ears is worse.  Had a tooth extracted yesterday and they added tylenol 3.  Have been tired and dizzy today.  Should he stay on Cymbalta 60mg  capsule or do something different.   Initial call taken by: Roselle Locus,  December 16, 2007 3:31 PM  Follow-up for Phone Call        informed to stop cymbalta Follow-up by: Judithann Sheen MD,  December 20, 2007 2:06 PM

## 2010-07-18 NOTE — Letter (Signed)
Summary: [facility]   Imported By: Felipa Evener 10/19/2007 15:54:07  _____________________________________________________________________  External Attachment:    Type:   Image     Comment:   [facility]

## 2010-07-18 NOTE — Assessment & Plan Note (Signed)
Summary: YWVP,XTGGYIRSW & RECTAL BLEED/YF   History of Present Illness Visit Type: Initial Consult Primary GI MD: Elie Goody MD [facility] Primary Provider: Rickard Patience, MD Requesting Provider: Danise Edge, MD Chief Complaint: He says he has a poor memory due to his stroke in May 2010. Patient complains of some rectal bleeding and watery stools. He has noticed the blood in his stool since last November but getting worse since March, he has a hx of proctitis. He does complain of some epigastric pain and increased reflux. History of Present Illness:   This is a 50 year old male, who relates intermittent rectal bleeding since November. He has significant memory problems related to prior CVA. Unfortunately, does not have anyone with him today. He states his rectal bleeding has worsened since March. He states he underwent a colonoscopy previously in 2006 in [place], [place]. He states he was previously told of proctitis and has also had prostatitis. He notes intermittent bright red blood and dark red blood with loose stools. he has chronic reflux symptoms are under fair control on ranitidine twice daily. He states omeprazole control his symptoms were effectively.   GI Review of Systems    Reports abdominal pain, acid reflux, belching, and  bloating.     Location of  Abdominal pain: epigastric area.    Denies chest pain, dysphagia with liquids, dysphagia with solids, heartburn, loss of appetite, nausea, vomiting, vomiting blood, weight loss, and  weight gain.      Reports diarrhea and  rectal bleeding.     Denies anal fissure, black tarry stools, change in bowel habit, constipation, diverticulosis, fecal incontinence, heme positive stool, hemorrhoids, irritable bowel syndrome, jaundice, light color stool, liver problems, and  rectal pain. Preventive Screening-Counseling & Management      Drug Use:  no.     Current Medications (verified): 1)  Diazepam 5 Mg Tabs (Diazepam) .Marland Kitchen.. 1 Tablet By  Mouth Three Times A Day As Needed 2)  Omeprazole 40 Mg  Cpdr (Omeprazole) .Marland Kitchen.. 1 Two Times A Day For Genella Rife As Needed (Holding) 3)  Hydroxyzine Hcl 25 Mg  Tabs (Hydroxyzine Hcl) .Marland Kitchen.. 1 By Mouth Three Times A Day As Needed 4)  Naprosyn 125 Mg/59ml  Susp (Naproxen) .... 4 Tsp Am and Pm As Needed 5)  Tylenol Codeine Liquid .Marland Kitchen.. 2-3 Tsp Q4h As Needed Pain 6)  Entex Liquid .... 2 Tsp Three Times A Day For Congestion As Needed 7)  Analpram-Hc Singles 1-2.5 % Crea (Hydrocortisone Ace-Pramoxine) .... Insert 1 Applicator Two Times A Day As Needed 8)  Nortriptyline Hcl 10 Mg/15ml Soln (Nortriptyline Hcl) .... 3 Tsp Hs 9)  Flunisolide 29 Mcg/act Soln (Flunisolide) .... 2 Sprays Each Nostril Two Times A Day 10)  Cortisporin 3.5-10000-1 Soln (Neomycin-Polymyxin-Hc) .... 4 Drops B/l Ears Three Times A Day X 10 Days 11)  Ranitidine Hcl 75 Mg/77ml Syrp (Ranitidine Hcl) .... 2 Tsp By Mouth Two Times A Day 12)  Nitrostat 0.4 Mg Subl (Nitroglycerin) .Marland Kitchen.. 1 Tab By Victorio Palm As Needed Cp Repeat Q X 3 Doses If Pain Is Persistent. If No Resolution After 3 Doses To Er  Allergies (verified): 1)  ! Seldane 2)  Amoxicillin (Amoxicillin)  Past History:  Past Medical History: Allergic rhinitis Headache PITUITARY ADENOMA NEPROLETHIASIS Stroke Sleep Apnea pineal gland cyst  Past Surgical History: Cataract extraction three times osteod esteoma surgery right ear surgery  Family History: Family History of Breast Cancer: mother father had hypertension  Social History: Patient exposed to 2nd hand smoke (heavy)  Alcohol Use - no Illicit Drug Use - no Drug Use:  no  Review of Systems       The patient complains of allergy/sinus, anxiety-new, arthritis/joint pain, back pain, change in vision, confusion, depression-new, fatigue, headaches-new, hearing problems, heart rhythm changes, muscle pains/cramps, shortness of breath, skin rash, sleeping problems, swelling of feet/legs, thirst - excessive, and urination  changes/pain.         The pertinent positives and negatives are noted as above and in the HPI. All other ROS were reviewed and were negative.  Vital Signs:  Patient profile:   50 year old male Height:      69 inches Weight:      191.6 pounds BMI:     28.40 Pulse rate:   104 / minute Pulse rhythm:   regular BP sitting:   144 / 90  (left arm) Cuff size:   regular  Vitals Entered By: Harlow Mares CMA Duncan Dull) (February 07, 2010 9:49 AM)  Physical Exam  General:  Well developed, well nourished, no acute distress. Head:  Normocephalic and atraumatic. Eyes:  PERRLA, no icterus. Mouth:  No deformity or lesions, dentition normal. Lungs:  Clear throughout to auscultation. Heart:  Regular rate and rhythm; no murmurs, rubs,  or bruits. Abdomen:  Soft, nontender and nondistended. No masses, hepatosplenomegaly or hernias noted. Normal bowel sounds. Rectal:  No lesions. 1+ heme positive brown stool. Msk:  Symmetrical with no gross deformities. Normal posture. Pulses:  Normal pulses noted. Extremities:  No clubbing, cyanosis, edema or deformities noted. Neurologic:  Poor memory, and occasionally confused. Psych:  easily distracted, poor concentration, and poor memory.    Impression & Recommendations:  Problem # 1:  BLOOD IN STOOL (ICD-578.1) Intermittent hematochezia associated with loose stools. Possible history of proctitis. Rule out colorectal neoplasms, active proctitis and colitis. The risks, benefits and alternatives to colonoscopy with possible biopsy and possible polypectomy were discussed with the patient and they consent to proceed. The procedure will be scheduled electively. Given his memory difficulties, it would be very beneficial for him to have a family member or friend accompany him to office visits for support. Attempt to obtain records from prior colonoscopy. Orders: Colonoscopy (Colon)  Problem # 2:  GERD (ICD-530.81) Continue ranitidine 150 mg twice daily, and standard  antireflux measures. If his reflux symptoms are not well controlled. Discontinue ranitidine and resume omeprazole 40 mg daily and if not adequate increase to twice daily.  Patient Instructions: 1)  Take ranitidine as prescribed and if your symptoms are not full resolved, start omeprazole once daily. 2)  Avoid foods high in acid content ( tomatoes, citrus juices, spicy foods) . Avoid eating within 3 to 4 hours of lying down or before exercising. Do not over eat; try smaller more frequent meals. Elevate head of bed four inches when sleeping.  3)  Colonoscopy brochure given.  4)  Copy sent to : Dianna Limbo, MD 5)  The medication list was reviewed and reconciled.  All changed / newly prescribed medications were explained.  A complete medication list was provided to the patient / caregiver.  Prescriptions: MOVIPREP 100 GM  SOLR (PEG-KCL-NACL-NASULF-NA ASC-C) As per prep instructions.  #1 x 0   Entered by:   Christie Nottingham CMA (AAMA)   Authorized by:   Meryl Dare MD [facility]   Signed by:   Meryl Dare MD [facility] on 02/07/2010   Method used:   [facility]  [address] [phone]* (retail)       [address]       [place], [place]  [address]       Ph: [phone] or [phone]       Fax: [phone]   RxID:   [id number]   Appended Document: GERD,DYSPHAGIA & RECTAL BLEED/YF reviewed

## 2010-07-18 NOTE — Progress Notes (Signed)
Summary: ov or meds requested  Phone Note Call from Patient   Caller: LIVE Call For: STAFFORD Action Taken: Phone Call Completed Summary of Call: Since sat deep cough mostly nonproductive, sinus pressure, headache, sore throat. T98.4.  1) Request liquid or chewable antibiotic &2) refill of Tylenol#3 for the pain. Need generic.  On the Entex, astepro NS, naproxen for the ha. 3) Millipred for back pain.  Has helped some.   5 left.  Should I continue?  [facility] [address] [phone].  Allergic to Amoxicillin Initial call taken by: Rudy Jew, RN,  February 13, 2009 10:28 AM  Follow-up for Phone Call        Put him on the schedule for tomorrow. Follow-up by: Darra Lis RMA,  February 13, 2009 4:07 PM  Additional Follow-up for Phone Call Additional follow up Details #1::        Phone Call Completed, Appt Scheduled Additional Follow-up by: Army Fossa CMA,  February 13, 2009 4:20 PM

## 2010-07-18 NOTE — Assessment & Plan Note (Signed)
Summary: sore throat/ccm   Vital Signs:  Patient Profile:   50 Years Old Male Weight:      194 pounds O2 Sat:      96 % Temp:     98.2 degrees F Pulse rate:   115 / minute BP sitting:   120 / 82  (left arm) Cuff size:   regular  Vitals Entered By: Pura Spice, RN (March 23, 2008 1:45 PM)                 Chief Complaint:  sore throat completed sulfametrazole by urologist ..  History of Present Illness: Pt complains of sore throat for paqst week increasing in severity headache and some pressure face and headache , top of head tender to touch Has burning when having  a bowel movement  with small amt of red blood , doesnt happen all the time continues to have ejaculatory pain  which he sees urologist which cannot be explained, has been to 3 urologist with poor results    Current Allergies (reviewed today): ! SELDANE AMOXICILLIN (AMOXICILLIN)     Review of Systems      See HPI  ENT      Complains of nasal congestion, sinus pressure, and sore throat.  Resp      Complains of cough.  GI      anal discomfort at times  GU      pain on ejaculation   Physical Exam  General:     Well-developed,well-nourished,in no acute distress; alert,appropriate and cooperative throughout examination Head:     tenderness over scalp Eyes:     No corneal or conjunctival inflammation noted. EOMI. Perrla. Funduscopic exam benign, without hemorrhages, exudates or papilledema. Vision grossly normal. Ears:     External ear exam shows no significant lesions or deformities.  Otoscopic examination reveals clear canals, tympanic membranes are intact bilaterally without bulging, retraction, inflammation or discharge. Hearing is grossly normal bilaterally. Nose:     minimal nasal congestion Mouth:     pharyngeal erythema.   Neck:     No deformities, masses, or tenderness noted. Lungs:     Normal respiratory effort, chest expands symmetrically. Lungs are clear to auscultation, no  crackles or wheezes. Heart:     Normal rate and regular rhythm. S1 and S2 normal without gallop, murmur, click, rub or other extra sounds. Rectal:     anal iritation other nwise normal Genitalia:     Testes bilaterally descended without nodularity, tenderness or masses. No scrotal masses or lesions. No penis lesions or urethral discharge. Prostate:     Prostate gland firm and smooth, no enlargement, nodularity, tenderness, mass, asymmetry or induration.    Impression & Recommendations:  Problem # 1:  ACUTE PHARYNGITIS (ICD-462) Assessment: New Flu Vaccine Consent Questions     Do you have a history of severe allergic reactions to this vaccine? no    Any prior history of allergic reactions to egg and/or gelatin? no    Do you have a sensitivity to the preservative Thimersol? no    Do you have a past history of Guillan-Barre Syndrome? no    Do you currently have an acute febrile illness? no    Have you ever had a severe reaction to latex? no    Vaccine information given and explained to patient? yes    Are you currently pregnant? no    Lot Number:[id number]   Site Given  Left Deltoid IM.....Marland KitchenMarland KitchenPura Spice, RN  March 23, 2008 3:42 PM   His updated medication list for this problem includes:    Tylenol 325 Mg Tabs (Acetaminophen) .Marland Kitchen... Prn    Naprosyn 125 Mg/72ml Susp (Naproxen) .Marland KitchenMarland KitchenMarland KitchenMarland Kitchen 4 tsp am and pm    Ciprofloxacin Hcl 500 Mg Tabs (Ciprofloxacin hcl) .Marland Kitchen... 1 cap two times a day    Cephalexin 250 Mg/65ml Susr (Cephalexin) .Marland Kitchen... 2 tsp tid   Problem # 2:  PROCTITIS (BMW-413.24) Assessment: New  Problem # 3:  HEADACHE (ICD-784.0) Assessment: Unchanged  His updated medication list for this problem includes:    Tylenol 325 Mg Tabs (Acetaminophen) .Marland Kitchen... Prn    Naprosyn 125 Mg/76ml Susp (Naproxen) .Marland KitchenMarland KitchenMarland KitchenMarland Kitchen 4 tsp am and pm   Problem # 4:  RHINITIS, BACTERIAL (ICD-460) Assessment: New  The following medications were removed from the medication list:    Tylenol Chest Congestion  325-200 Mg Tabs (Acetaminophen-guaifenesin)  His updated medication list for this problem includes:    Tylenol 325 Mg Tabs (Acetaminophen) .Marland Kitchen... Prn    Naprosyn 125 Mg/12ml Susp (Naproxen) .Marland KitchenMarland KitchenMarland KitchenMarland Kitchen 4 tsp am and pm   Complete Medication List: 1)  Diazepam 5 Mg Tabs (Diazepam) .Marland Kitchen.. 1 tablet by mouth three times a day 2)  Finasteride 5 Mg Tabs (Finasteride) .Marland Kitchen.. 1 qd 3)  Imipramine Hcl 25 Mg Tabs (Imipramine hcl) .Marland Kitchen.. 1 tid 4)  Tylenol 325 Mg Tabs (Acetaminophen) .... Prn 5)  Flonase 50 Mcg/act Susp (Fluticasone propionate) .... 2 sprays once daily prn 6)  Omeprazole 40 Mg Cpdr (Omeprazole) .Marland Kitchen.. 1 once daily 7)  Hydroxyzine Hcl 25 Mg Tabs (Hydroxyzine hcl) .Marland Kitchen.. 1 by mouth three times a day 8)  Naprosyn 125 Mg/47ml Susp (Naproxen) .... 4 tsp am and pm 9)  Ciprofloxacin Hcl 500 Mg Tabs (Ciprofloxacin hcl) .Marland Kitchen.. 1 cap two times a day 10)  Cephalexin 250 Mg/82ml Susr (Cephalexin) .... 2 tsp tid 11)  Analpram-hc Singles 1-2.5 % Crea (Hydrocortisone ace-pramoxine) .... Insert i applicator two times a day for proctitis 12)  Flonase 50 Mcg/act Susp (Fluticasone propionate) .... 2  sprays each nostril qd 13)  Tylenol Codeine Liquid  .Marland Kitchen.. 2-3 tsp q4h as needed pain 14)  Entex Liquid  .... 2 tsp tid  Other Orders: Admin 1st Vaccine (40102) Flu Vaccine 92yrs + (72536)   Patient Instructions: 1)  Pharyngitis , tx cephalexin 2)  rhnitis, entex 3)  proctitis, analpram hc 4)  headache Tylenol 3 liquid 5)  Pt unable to swallow tablets 6)  To refer back to Urologist for ejaculatory pain   Prescriptions: FLONASE 50 MCG/ACT SUSP (FLUTICASONE PROPIONATE) 2  sprays each nostril qd  #1 x 11   Entered and Authorized by:   Judithann Sheen MD   Signed by:   Judithann Sheen MD on 03/23/2008   Method used:   Electronically to        [facility] [phone]* (retail)       [address]       [place], [place]  [address]       Ph: [phone] or [phone]       Fax: [phone]    RxID:   [id number] [facility] SINGLES 1-2.5 % CREA (HYDROCORTISONE ACE-PRAMOXINE) Insert i applicator two times a day for proctitis  #1 pke x 5   Entered and Authorized by:   Judithann Sheen MD   Signed by:   Judithann Sheen MD on 03/23/2008   Method used:   [facility]ronically to  [facility] [phone]* (retail)       [address]       [place], [place]  [address]       Ph: [phone] or [phone]       Fax: [phone]   RxID:   [id number] CEPHALEXIN 250 MG/5ML SUSR (CEPHALEXIN) 2 tsp tid  #300cc x 1   Entered and Authorized by:   Judithann Sheen MD   Signed by:   Judithann Sheen MD on 03/23/2008   Method used:   [facility]ically to        [facility] [phone]* (retail)       [address]       [place], [place]  [address]       Ph: [phone] or [phone]       Fax: [phone]   RxID:   [id number]  ]
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
## 2010-07-18 NOTE — Letter (Signed)
Summary: [facility]  [facility]   Imported By: Maryln Gottron 06/20/2009 13:07:24  _____________________________________________________________________  External Attachment:    Type:   Image     Comment:   External Document

## 2010-07-18 NOTE — Progress Notes (Signed)
----   Converted from flag ---- ---- 06/06/2010 5:29 PM, Edwyna Perfect MD wrote: reviewed cbc and is nl. recommend gi appt for further evaluation of bleeding. referral pending  ---- 06/06/2010 2:02 PM, Edwyna Perfect MD wrote: check cbc ?gi ------------------------------ lvm  pt aware and wants to know if the appt can possibly be made before the end of the year...advised pt that I'm not sure if that's possible but that I would let Terri know of his request.

## 2010-07-18 NOTE — Assessment & Plan Note (Signed)
Summary: congestion/sneezing/ccm   Vital Signs:  Patient Profile:   50 Years Old Male Height:     71 inches Weight:      194 pounds BMI:     27.16 O2 Sat:      98 % Temp:     98.4 degrees F Pulse rate:   112 / minute BP sitting:   120 / 80  (left arm)  Vitals Entered By: Pura Spice, RN (April 13, 2008 4:30 PM)                 Chief Complaint:  cough ears ringing sore throat.  History of Present Illness: Onset over past 4 days sore throat, cough nonproductive, aching, both ears are painful headache,  temp 99.4 1 day ago, no chills burning ejaculatory pain, to refer to Dr. Jacquelyne Balint no other complaints    Current Allergies (reviewed today): ! SELDANE AMOXICILLIN (AMOXICILLIN)      Physical Exam  General:     Well-developed,well-nourished,in no acute distress; alert,appropriate and cooperative throughout examination Head:     Normocephalic and atraumatic without obvious abnormalities. No apparent alopecia or balding. Eyes:     No corneal or conjunctival inflammation noted. EOMI. Perrla. Funduscopic exam benign, without hemorrhages, exudates or papilledema. Vision grossly normal. Ears:     External ear exam shows no significant lesions or deformities.  Otoscopic examination reveals clear canals, tympanic membranes are intact bilaterally without bulging, retraction, inflammation or discharge. Hearing is grossly normal bilaterally. Nose:     nasal mucosa swollen, clear drainage Mouth:     miinimal erythema pharynx Neck:     No deformities, masses, or tenderness noted. Lungs:     rhonchi bilaterally, no cracklesno dullness.   Heart:     Normal rate and regular rhythm. S1 and S2 normal without gallop, murmur, click, rub or other extra sounds.    Impression & Recommendations:  Problem # 1:  ACUTE BRONCHITIS (ICD-466.0) Assessment: Deteriorated  The following medications were removed from the medication list:    Ciprofloxacin Hcl 500 Mg Tabs  (Ciprofloxacin hcl) .Marland Kitchen... 1 cap two times a day    Cephalexin 250 Mg/14ml Susr (Cephalexin) .Marland Kitchen... 2 tsp tid  His updated medication list for this problem includes:    Avelox 400 Mg Tabs (Moxifloxacin hcl) .Marland Kitchen... 1 each day   Problem # 2:  ALLERGIC RHINITIS (ICD-477.9) Assessment: Unchanged  The following medications were removed from the medication list:    Flonase 50 Mcg/act Susp (Fluticasone propionate) .Marland Kitchen... 2 sprays once daily prn  His updated medication list for this problem includes:    Flonase 50 Mcg/act Susp (Fluticasone propionate) .Marland Kitchen... 2  sprays each nostril qd   Complete Medication List: 1)  Diazepam 5 Mg Tabs (Diazepam) .Marland Kitchen.. 1 tablet by mouth three times a day 2)  Finasteride 5 Mg Tabs (Finasteride) .Marland Kitchen.. 1 qd 3)  Imipramine Hcl 25 Mg Tabs (Imipramine hcl) .Marland Kitchen.. 1 tid 4)  Tylenol 325 Mg Tabs (Acetaminophen) .... Prn 5)  Omeprazole 40 Mg Cpdr (Omeprazole) .Marland Kitchen.. 1 once daily 6)  Hydroxyzine Hcl 25 Mg Tabs (Hydroxyzine hcl) .Marland Kitchen.. 1 by mouth three times a day 7)  Naprosyn 125 Mg/58ml Susp (Naproxen) .... 4 tsp am and pm 8)  Analpram-hc Singles 1-2.5 % Crea (Hydrocortisone ace-pramoxine) .... Insert i applicator two times a day for proctitis 9)  Flonase 50 Mcg/act Susp (Fluticasone propionate) .... 2  sprays each nostril qd 10)  Tylenol Codeine Liquid  .Marland Kitchen.. 2-3 tsp q4h as needed pain 11)  Entex Liquid  .... 2 tsp tid 12)  Avelox 400 Mg Tabs (Moxifloxacin hcl) .Marland Kitchen.. 1 each day   Patient Instructions: 1)  viral syndrome 2)  omnaris 2 sprays each nostril  each day 3)  Avelox 400mg   1 each day 4)  Take tylenol with codeine and 1 tsp entex liquid every 4 hours if needed for cough or pain 5)  Make appt with Dr. Jacquelyne Balint for burning penile pain 6)  good fluid intake   ]

## 2010-07-18 NOTE — Letter (Signed)
Summary: [facility] Instructions  [facility]  [address] [place], [place] [address]   Phone: [phone]  Fax: [phone]       Chase [name]    [birth year]    MRN: [id number]        Procedure Day /Date: Tuesday September 6th, 2011     Arrival Time: 8:30am      Procedure Time: 9:30am     Location of Procedure:                    _x _  [facility] (4th Floor)                        PREPARATION FOR COLONOSCOPY WITH MOVIPREP   Starting 5 days prior to your procedure 02/14/10 do not eat nuts, seeds, popcorn, corn, beans, peas,  salads, or any raw vegetables.  Do not take any fiber supplements (e.g. Metamucil, Citrucel, and Benefiber).  THE DAY BEFORE YOUR PROCEDURE         DATE: 02/18/10  DAY: Monday  1.  Drink clear liquids the entire day-NO SOLID FOOD  2.  Do not drink anything colored red or purple.  Avoid juices with pulp.  No orange juice.  3.  Drink at least 64 oz. (8 glasses) of fluid/clear liquids during the day to prevent dehydration and help the prep work efficiently.  CLEAR LIQUIDS INCLUDE: Water Jello Ice Popsicles Tea (sugar ok, no milk/cream) Powdered fruit flavored drinks Coffee (sugar ok, no milk/cream) Gatorade Juice: apple, white grape, white cranberry  Lemonade Clear bullion, consomm, broth Carbonated beverages (any kind) Strained chicken noodle soup Hard Candy                             4.  In the morning, mix first dose of MoviPrep solution:    Empty 1 Pouch A and 1 Pouch B into the disposable container    Add lukewarm drinking water to the top line of the container. Mix to dissolve    Refrigerate (mixed solution should be used within 24 hrs)  5.  Begin drinking the prep at 5:00 p.m. The MoviPrep container is divided by 4 marks.   Every 15 minutes drink the solution down to the next mark (approximately 8 oz) until the full liter is complete.   6.  Follow completed prep with 16 oz of clear liquid of your choice  (Nothing red or purple).  Continue to drink clear liquids until bedtime.  7.  Before going to bed, mix second dose of MoviPrep solution:    Empty 1 Pouch A and 1 Pouch B into the disposable container    Add lukewarm drinking water to the top line of the container. Mix to dissolve    Refrigerate  THE DAY OF YOUR PROCEDURE      DATE: 02/19/10 DAY: Tuesday  Beginning at 4:30 a.m. (5 hours before procedure):         1. Every 15 minutes, drink the solution down to the next mark (approx 8 oz) until the full liter is complete.  2. Follow completed prep with 16 oz. of clear liquid of your choice.    3. You may drink clear liquids until 7:30am (2 HOURS BEFORE PROCEDURE).   MEDICATION INSTRUCTIONS  Unless otherwise instructed, you should take regular prescription medications with a small sip of water   as early as possible the morning of  your procedure.        OTHER INSTRUCTIONS  You will need a responsible adult at least 50 years of age to accompany you and drive you home.   This person must remain in the waiting room during your procedure.  Wear loose fitting clothing that is easily removed.  Leave jewelry and other valuables at home.  However, you may wish to bring a book to read or  an iPod/MP3 player to listen to music as you wait for your procedure to start.  Remove all body piercing jewelry and leave at home.  Total time from sign-in until discharge is approximately 2-3 hours.  You should go home directly after your procedure and rest.  You can resume normal activities the  day after your procedure.  The day of your procedure you should not:   Drive   Make legal decisions   Operate machinery   Drink alcohol   Return to work  You will receive specific instructions about eating, activities and medications before you leave.    The above instructions have been reviewed and explained to me by   Estill Bamberg.     I fully understand and can verbalize these  instructions _____________________________ Date _________

## 2010-07-18 NOTE — Assessment & Plan Note (Signed)
Summary: PURULENT (PAINFUL) SORES ON STOMACH/BACK // RS   Vital Signs:  Patient profile:   50 year old male Weight:      190 pounds O2 Sat:      98 % Temp:     98 degrees F Pulse rate:   117 / minute BP sitting:   134 / 86  (left arm)  Vitals Entered By: Pura Spice, RN (May 02, 2009 4:29 PM) CC: sores on stomach and back.  blood in stool and difficutly to pass then left elbow sore and his ear aches   History of Present Illness: 50 yo white mALE with multiple complaints ear pain both in canal and aching pain rt ear States having recatal bleeding with red and dk n blood, proctitis in past and using cream but some blood in stool, to refer to gastroenterologist complains of symptoms compatable with gerd, has not been taking prilosec as instructed some pain left elbow, not taking naproxen continues to complain of ejaculatory pain, appt arraned with Dr. Retta Diones   Allergies: 1)  ! Seldane 2)  Amoxicillin (Amoxicillin)  Past History:  Past Surgical History: Last updated: 12/23/2006 Cataract extraction Kidney Stone Back and neck surgery  Risk Factors: Smoking Status: quit (02/19/2007)  Past Medical History: Allergic rhinitis Headache PITUITARY ADENOMA nEPROLETHIASIS  Review of Systems      See HPI General:  See HPI; Complains of fatigue. Eyes:  myopia, cataracts removed in pAST. ENT:  Complains of ear discharge and earache. CV:  Denies bluish discoloration of lips or nails, chest pain or discomfort, difficulty breathing at night, difficulty breathing while lying down, fainting, fatigue, leg cramps with exertion, lightheadness, near fainting, palpitations, shortness of breath with exertion, swelling of feet, swelling of hands, and weight gain. Resp:  Denies chest discomfort, chest pain with inspiration, cough, coughing up blood, excessive snoring, hypersomnolence, morning headaches, pleuritic, shortness of breath, sputum productive, and wheezing. GI:  Complains of  bloody stools and constipation. GU:  EJACULATORY PAIN. MS:  Complains of joint pain.  Physical Exam  General:  Well-developed,well-nourished,in no acute distress; alert,appropriate and cooperative throughout examination Head:  Normocephalic and atraumatic without obvious abnormalities. No apparent alopecia or balding. Eyes:  glasses Ears:  external otitis bilaterally, rt TMeerythematous and full Nose:  minimal nasal congestion Mouth:  Oral mucosa and oropharynx without lesions or exudates.  Teeth in good repair. Lungs:  Normal respiratory effort, chest expands symmetrically. Lungs are clear to auscultation, no crackles or wheezes. Heart:  Normal rate and regular rhythm. S1 and S2 normal without gallop, murmur, click, rub or other extra sounds. Abdomen:  Bowel sounds positive,abdomen soft and non-tender without masses, organomegaly or hernias noted. Rectal:  not EXAMINED TO REFER TO gi Msk:  TENDERNESS RT ELBOW, GENERALIZED TENDERNESS Pulses:  R and L carotid,radial,femoral,dorsalis pedis and posterior tibial pulses are full and equal bilaterally Extremities:  No clubbing, cyanosis, edema, or deformity noted with normal full range of motion of all joints.   Neurologic:  No cranial nerve deficits noted. Station and gait are normal. Plantar reflexes are down-going bilaterally. DTRs are symmetrical throughout. Sensory, motor and coordinative functions appear intact.   Impression & Recommendations:  Problem # 1:  OTITIS MEDIA, ACUTE (ICD-382.9) Assessment Deteriorated  The following medications were removed from the medication list:    Levaquin 500 Mg Tabs (Levofloxacin) .Marland Kitchen... 1 qd His updated medication list for this problem includes:    Naprosyn 125 Mg/13ml Susp (Naproxen) .Marland KitchenMarland KitchenMarland KitchenMarland Kitchen 4 tsp am and pm    Cephalexin  250 Mg/15ml Susr (Cephalexin) .Marland Kitchen... 2 tsp qid for 2 days then 1 tsp tid  Orders: Prescription Created Electronically [phone])  Problem # 2:  RHINITIS, BACTERIAL (ICD-460)  Assessment: Deteriorated  His updated medication list for this problem includes:    Naprosyn 125 Mg/80ml Susp (Naproxen) .Marland KitchenMarland KitchenMarland KitchenMarland Kitchen 4 tsp am and pm  Problem # 3:  ARTHRITIS (ICD-716.90) Assessment: Unchanged nAPROXEN  Problem # 4:  GERD (ICD-530.81) Assessment: Unchanged  His updated medication list for this problem includes:    Omeprazole 40 Mg Cpdr (Omeprazole) .Marland Kitchen... 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 1 tablet by mouth three times a day 2)  Finasteride 5 Mg Tabs (Finasteride) .Marland Kitchen.. 1 qd 3)  Imipramine Hcl 25 Mg Tabs (Imipramine hcl) .Marland Kitchen.. 1 tid 4)  Omeprazole 40 Mg Cpdr (Omeprazole) .Marland Kitchen.. 1 two times a day for gerd 5)  Hydroxyzine Hcl 25 Mg Tabs (Hydroxyzine hcl) .Marland Kitchen.. 1 by mouth three times a day 6)  Naprosyn 125 Mg/75ml Susp (Naproxen) .... 4 tsp am and pm 7)  Tylenol Codeine Liquid  .Marland Kitchen.. 2-3 tsp q4h as needed pain 8)  Entex Liquid  .... 2 tsp three times a day for congestion 9)  Analpram-hc Singles 1-2.5 % Crea (Hydrocortisone ace-pramoxine) .... Insert 1 applicator two times a day 10)  Nasonex 50 Mcg/act Susp (Mometasone furoate) .... 2 sprays each nostril daily 11)  Cephalexin 250 Mg/43ml Susr (Cephalexin) .... 2 tsp qid for 2 days then 1 tsp tid 12)  Cortisporin 3.5-10000-1 Soln (Neomycin-polymyxin-hc) .... 4 qtts in each ear tid  Patient  Instructions: 1)  problemsotitis media , cortisporin otic drops 2)  otitis media cephalexin, nasonex, entex  3)  gerd , prilosec 4)  rectal bleeding, to refer to gastroenterologist 5)  pyoderma , suspected staph infected lesions, cephalexin for 10 days 6)  keep appt with Dr. Retta Diones Prescriptions: TYLENOL CODEINE LIQUID 2-3 tsp q4h as needed pain  #360cc x 5   Entered and Authorized by:   Judithann Sheen MD   Signed by:   Judithann Sheen MD on 05/02/2009   Method used:   Print then Give to Patient   RxID:   [id number] OMEPRAZOLE 40 MG  CPDR (OMEPRAZOLE) 1 two times a day for gerd  #60 x 11   Entered and Authorized by:   Judithann Sheen MD   Signed by:   Judithann Sheen MD on 05/02/2009   Method used:   [facility] [address] [phone]* (retail)       [address]       [place], [place]  [address]       Ph: [phone] or [phone]       Fax: [phone]   RxID:   [id number] CORTISPORIN 3.5-10000-1 SOLN [facility]) 4 qtts in each ear tid  #5 cc  x 1   Entered and Authorized by:   Judithann Sheen MD   Signed by:   Judithann Sheen MD on 05/02/2009   Method used:   [facility]        [facility] [address] [phone]* (retail)       [address]       [place], [place]  [address]       Ph: [phone] or [phone]       Fax: [phone]   RxID:   [id number] CEPHALEXIN 250 MG/5ML SUSR (CEPHALEXIN) 2 tsp qid for 2 days then 1 tsp tid  #320 cc x 1   Entered and Authorized by:   Judithann Sheen MD   Signed by:   Judithann Sheen MD on 05/02/2009   Method used:   [facility]        [facility] [address] [phone]* (retail)       [address]       [place], [place]  [address]       Ph: [phone] or [phone]       Fax: [phone]   RxID:   [id number]

## 2010-07-18 NOTE — Assessment & Plan Note (Signed)
Summary: Cardiology Nuclear Testing  Nuclear Med Background Indications for Stress Test: Evaluation for Ischemia   History: Echo   Symptoms: Chest Pain, Palpitations    Nuclear Pre-Procedure Cardiac Risk Factors: CVA, History of Smoking Caffeine/Decaff Intake: none NPO After: 9:00 PM Lungs: clear IV 0.9% NS with Angio Cath: 22g     IV Site: R Hand IV Started by: Cathlyn Parsons, RN Chest Size (in) 42     Height (in): 69 Weight (lb): 189 BMI: 28.01  Nuclear Med Study 1 or 2 day study:  1 day     Stress Test Type:  Stress Reading MD:  Olga Millers, MD     Referring MD:  [facility] Resting Radionuclide:  Technetium 23m Tetrofosmin     Resting Radionuclide Dose:  11 mCi  Stress Radionuclide:  Technetium 43m Tetrofosmin     Stress Radionuclide Dose:  33 mCi   Stress Protocol Exercise Time (min):  4:00 min     Max HR:  151 bpm     Predicted Max HR:  171 bpm  Max Systolic BP: 163 mm Hg     Percent Max HR:  88.30 %     METS: 5.8 Rate Pressure Product:  16109    Stress Test Technologist:  Milana Na, EMT-P     Nuclear Technologist:  Domenic Polite, CNMT  Rest Procedure  Myocardial perfusion imaging was performed at rest 45 minutes following the intravenous administration of Technetium 74m Tetrofosmin.  Stress Procedure  The patient exercised for 4:00. The patient stopped due to fatigue and chest tightness in recovery.  There were no significant ST-T wave changes, but occ pvcs/pacs.  Technetium 33m Tetrofosmin was injected at peak exercise and myocardial perfusion imaging was performed after a brief delay.  QPS Raw Data Images:  Acquisition technically good; normal left ventricular size. Stress Images:  There is decreased uptake in the apex. Rest Images:  There is decreased uptake in the apex. Subtraction (SDS):  No evidence of ischemia. Transient Ischemic Dilatation:  1.08  (Normal <1.22)  Lung/Heart Ratio:  0.26  (Normal <0.45)  Quantitative Gated Spect  Images QGS EDV:  110 ml QGS ESV:  47 ml QGS EF:  57 % QGS cine images:  Normal LV function.   Overall Impression  Exercise Capacity: Fair exercise capacity. BP Response: Normal blood pressure response. Clinical Symptoms: There is chest pain ECG Impression: No significant ST segment change suggestive of ischemia. Overall Impression: Normal stress nuclear study with mild apical thinning but no ischemia.

## 2010-07-18 NOTE — Progress Notes (Signed)
Summary: Nuclear pre procedure  Phone Note Outgoing Call Call back at [facility] Phone [phone]   Call placed by: Rea College, CMA,  February 11, 2010 3:28 PM Call placed to: Patient Summary of Call: Reviewed information on [employer] Information Sheet (see scanned document for further details).  Spoke with patient.      Nuclear Med Background Indications for Stress Test: Evaluation for Ischemia   History: Echo   Symptoms: Chest Pain    Nuclear Pre-Procedure Cardiac Risk Factors: CVA, History of Smoking Height (in): 69

## 2010-07-18 NOTE — Assessment & Plan Note (Signed)
Summary: cough congestion not better/mhf   Vital Signs:  Patient Profile:   50 Years Old Male Height:     71 inches Weight:      188 pounds Temp:     98.7 degrees F Pulse rate:   102 / minute BP sitting:   120 / 80  (left arm) Cuff size:   regular  Vitals Entered By: Pura Spice, RN (August 21, 2008 2:24 PM)                 Chief Complaint:  ?sinus been on azithromycin snce frinday can't tell if helped or not .  History of Present Illness: Pt did not respond to tx Zpack and has now more severe with sore throat, nasal congestion, sinus pressure, yellow green drainage and sputum, cough productive yellow general malaise, aching, fever 1 day ago pt unable to swallow tablets    Current Allergies (reviewed today): ! SELDANE AMOXICILLIN (AMOXICILLIN)     Review of Systems      See HPI  General      Complains of fever, malaise, and weakness.  ENT      Complains of earache, nasal congestion, postnasal drainage, and sinus pressure.  CV      Denies bluish discoloration of lips or nails, chest pain or discomfort, difficulty breathing at night, difficulty breathing while lying down, fainting, fatigue, leg cramps with exertion, lightheadness, near fainting, palpitations, shortness of breath with exertion, swelling of feet, swelling of hands, and weight gain.  Resp      Complains of cough and sputum productive.  GI      Denies abdominal pain, bloody stools, change in bowel habits, constipation, dark tarry stools, diarrhea, excessive appetite, gas, hemorrhoids, indigestion, loss of appetite, nausea, vomiting, vomiting blood, and yellowish skin color.  GU      Denies decreased libido, discharge, dysuria, erectile dysfunction, genital sores, hematuria, incontinence, nocturia, urinary frequency, and urinary hesitancy.   Physical Exam  General:     Well-developed,well-nourished,in no acute distress; alert,appropriate and cooperative throughout examination Head:  Normocephalic and atraumatic without obvious abnormalities. No apparent alopecia or balding. Eyes:     No corneal or conjunctival inflammation noted. EOMI. Perrla. Funduscopic exam benign, without hemorrhages, exudates or papilledema. Vision grossly normal. Ears:     Left TM erythematous Nose:     erythematous mucosa swollen with yellow green drainage maxillary pressure Mouth:     pharyngeal erythema.   with exudate ant cervical nodes Lungs:     rhonchi , occasional ralesno dullness and no wheezes.   Heart:     Normal rate and regular rhythm. S1 and S2 normal without gallop, murmur, click, rub or other extra sounds.    Impression & Recommendations:  Problem # 1:  OTITIS MEDIA, ACUTE (ICD-382.9) Assessment: Deteriorated  The following medications were removed from the medication list:    Avelox 400 Mg Tabs (Moxifloxacin hcl) .Marland Kitchen... 1 each day    Cephalexin 250 Mg/37ml Susr (Cephalexin) .Marland Kitchen... 2 tsp three times a day  His updated medication list for this problem includes:    Tylenol 325 Mg Tabs (Acetaminophen) .Marland Kitchen... Prn    Naprosyn 125 Mg/43ml Susp (Naproxen) .Marland KitchenMarland KitchenMarland KitchenMarland Kitchen 4 tsp am and pm   Problem # 2:  RHINITIS, BACTERIAL (ICD-460) Assessment: Deteriorated  His updated medication list for this problem includes:    Tylenol 325 Mg Tabs (Acetaminophen) .Marland Kitchen... Prn    Naprosyn 125 Mg/48ml Susp (Naproxen) .Marland KitchenMarland KitchenMarland KitchenMarland Kitchen 4 tsp am and pm   Problem # 3:  ACUTE PHARYNGITIS (ICD-462) Assessment: Deteriorated  The following medications were removed from the medication list:    Avelox 400 Mg Tabs (Moxifloxacin hcl) .Marland Kitchen... 1 each day    Cephalexin 250 Mg/64ml Susr (Cephalexin) .Marland Kitchen... 2 tsp three times a day  His updated medication list for this problem includes:    Tylenol 325 Mg Tabs (Acetaminophen) .Marland Kitchen... Prn    Naprosyn 125 Mg/48ml Susp (Naproxen) .Marland KitchenMarland KitchenMarland KitchenMarland Kitchen 4 tsp am and pm   Problem # 4:  ACUTE BRONCHITIS (ICD-466.0) Assessment: Deteriorated  The following medications were removed from the medication  list:    Avelox 400 Mg Tabs (Moxifloxacin hcl) .Marland Kitchen... 1 each day    Cephalexin 250 Mg/1ml Susr (Cephalexin) .Marland Kitchen... 2 tsp three times a day  Orders: Rocephin  250mg  (O5366) Admin of Therapeutic Inj  intramuscular or subcutaneous (44034)   Complete Medication List: 1)  Diazepam 5 Mg Tabs (Diazepam) .Marland Kitchen.. 1 tablet by mouth three times a day 2)  Finasteride 5 Mg Tabs (Finasteride) .Marland Kitchen.. 1 qd 3)  Imipramine Hcl 25 Mg Tabs (Imipramine hcl) .Marland Kitchen.. 1 tid 4)  Tylenol 325 Mg Tabs (Acetaminophen) .... Prn 5)  Omeprazole 40 Mg Cpdr (Omeprazole) .Marland Kitchen.. 1 two times a day 6)  Hydroxyzine Hcl 25 Mg Tabs (Hydroxyzine hcl) .Marland Kitchen.. 1 by mouth three times a day 7)  Naprosyn 125 Mg/84ml Susp (Naproxen) .... 4 tsp am and pm 8)  Analpram-hc Singles 1-2.5 % Crea (Hydrocortisone ace-pramoxine) .... Insert i applicator two times a day for proctitis 9)  Tylenol Codeine Liquid  .Marland Kitchen.. 2-3 tsp q4h as needed pain 10)  Entex Liquid  .... 2 tsp three times a day for congestion 11)  Astepro 0.15 % Soln (Azelastine hcl) .... 2  sprays  morning and  midafternoon 12)  Azithromycin Susp  .... Take 500mg  x 1 day then 250mg  x 4 days   Patient Instructions: 1)  acute bronchitis , sinusitis, otiits media rt, pharyngitis 2)  Entex LA 2 tsp three times a day 3)  Rocephin 1.0 gm IM x 3 days 4)  tylenol 3 as needed pain or coughing      Medication Administration  Injection # 1:    Medication: Rocephin  250mg     Diagnosis: ACUTE BRONCHITIS (ICD-466.0)    Route: IM    Site: RUOQ gluteus    Exp Date: 01/2011    Lot #: [id number]    Mfr: novaplus    Comments: 1 gram given     Patient tolerated injection without complications    Given by: Pura Spice, RN (August 21, 2008 3:38 PM)  Orders Added: 1)  Rocephin  250mg  [J0696] 2)  Admin of Therapeutic Inj  intramuscular or subcutaneous [96372] 3)  Est. Patient Level IV [63875]

## 2010-07-18 NOTE — Progress Notes (Signed)
Summary: Req for different meds?  Phone Note Call from Patient   Caller: Patient @ [phone] Reason for Call: Talk to Nurse, Talk to Doctor Summary of Call: Pt called to adv that the meds given to him at his last appt (05/02/2009) has not helped.... the sores on his chest and back are still there and still bothering him.... Pt wanted to see if there was any possible way that some other type of medication to treat his ongoing symptoms [facility] [address].... Pt adv that he can be reached @ [phone] with any questions or concerns. Initial call taken by: Debbra Riding,  May 09, 2009 4:42 PM  Follow-up for Phone Call        left mess on answering machine will call on tuesday.   Follow-up by: Pura Spice, RN,  May 09, 2009 5:05 PM  Additional Follow-up for Phone Call Additional follow up Details #1::        spoke with pt request to see dermatology states sores on chest better unusure if on back still  Additional Follow-up by: Pura Spice, RN,  May 15, 2009 3:06 PM

## 2010-07-18 NOTE — Progress Notes (Signed)
Summary: rx and lab results    Phone Note Call from Patient Call back at [phone]   Caller: patient live Call For: panosh for stafford Summary of Call: jpatient saw dr Scotty Court last thursday he was suspose to get a rx for and antib for chest condition,sinus,throat and uti. [facility] [address] [phone].  he was to get spray for sinus.  dr Otelia Sergeant put him on gabapenpin 300mg  cap 1at bed for 3 days, to cap for 3 days,then 3xaday.  urine is cloudy and no energy. Initial call taken by: Celine Ahr,  August 09, 2007 9:01 AM  Follow-up for Phone Call        Patient aware that Dr. Satira Sark out today.  No rx for antibiotic ov note.  Patient says that he thought he was going to change him to different NS than Flonase.  Was not given any RXs and noe at drugstore. Follow-up by: Rudy Jew, RN,  August 09, 2007 9:34 AM  Additional Follow-up for Phone Call Additional follow up Details #1::        patient needs meds for his stomach, nose spray different than flonase.  rx for the antibiotic dr Scotty Court said he would call in on Thrusday last week.  [facility] [address]  Please call the patient when these are called in.  Would also like lab results  call [phone] Additional Follow-up by: Roselle Locus,  August 10, 2007 11:51 AM    Additional Follow-up for Phone Call Additional follow up Details #2::    pt called by dr Scotty Court Follow-up by: Pura Spice, RN,  August 11, 2007 10:16 AM

## 2010-07-18 NOTE — Letter (Signed)
Summary: [facility]  [facility]   Imported By: Maryln Gottron 01/23/2010 15:18:06  _____________________________________________________________________  External Attachment:    Type:   Image     Comment:   External Document

## 2010-07-18 NOTE — Progress Notes (Signed)
Summary: REQUEST FOR RETURN CALL  Phone Note Call from Patient   Caller: Patient  [phone] Summary of Call: Pt called to speak with Dr Scotty Court..... Pt states that he was supposed to c/b to advise Dr Scotty Court of something he was asked at the time of his OV today........???????   Pt can be reached at [phone].  Initial call taken by: Debbra Riding,  May 23, 2010 3:31 PM  Follow-up for Phone Call        Pt has questions re: Immipramine. Pls call. Pt did not pick up script because he has questions about it first.  Follow-up by: Lucy Antigua,  May 27, 2010 11:41 AM  Additional Follow-up for Phone Call Additional follow up Details #1::        left message on machine returning patient's call Additional Follow-up by: Kern Reap CMA Duncan Dull),  June 06, 2010 10:49 AM

## 2010-07-18 NOTE — Consult Note (Signed)
Summary: sleep center report  sleep center report   Imported By: Kassie Mends 01/27/2007 14:48:16  _____________________________________________________________________  External Attachment:    Type:   Image     Comment:   sleep center note

## 2010-07-18 NOTE — Progress Notes (Signed)
Summary: NEW RX  Astepro   Phone Note Call from Patient Call back at Home Phone [phone]   Caller: Patient Call For: DR STAFFORD Summary of Call: PT HAS SAMPLE OF ASTEPRO 0.15 NASAL SPRAY P[facility] CALL [facility] [address] [phone] Initial call taken by: Heron Sabins,  July 26, 2008 3:56 PM  Follow-up for Phone Call        ok per dr Scotty Court  I escripted it across  Follow-up by: Pura Spice, RN,  July 26, 2008 4:07 PM    New/Updated Medications: ASTEPRO 0.15 % SOLN (AZELASTINE HCL) 2  sprays  morning and  midafternoon   Prescriptions: ASTEPRO 0.15 % SOLN (AZELASTINE HCL) 2  sprays  morning and  midafternoon  #1 x 3   Entered by:   Pura Spice, RN   Authorized by:   Judithann Sheen MD   Signed by:   Pura Spice, RN on 07/26/2008   Method used:   [facility]        [facility]  [address] [phone][facility] (retail)       [address]       [place], [place]  [address]       Ph: [phone] or [phone]       Fax: [phone]   RxID:   [id number]

## 2010-07-18 NOTE — Letter (Signed)
Summary: [facility] ear nose and throat note  [facility] ear nose and throat note   Imported By: Kassie Mends 02/02/2007 09:04:28  _____________________________________________________________________  External Attachment:    Type:   Image     Comment:   [facility] ear nose and throat note

## 2010-07-18 NOTE — Assessment & Plan Note (Signed)
Summary: stitch removal/cjr   Vital Signs:  Patient profile:   50 year old male O2 Sat:      97 % Pulse rate:   120 / minute BP sitting:   132 / 80  (left arm)  Vitals Entered By: Pura Spice, RN (March 01, 2009 3:38 PM) CC: remove stitches form frontal forehead . hit head last week on trunk of car and went to [place] -had xr head per pt. see dr love for muscle weakness which he states dr love did all kinds of test and states they were normal or negative but states muscle weakness worse.     History of Present Illness: Pt had trunk lid to fall on forehead and caused a 8cm laceration , 7 days ago, sutured at [facility] ER and in today to have sutures removed also complaing of increased muscle weakness which was thought to be due to statins but has been off with no improvement,had eval by Dr. Sandria Manly and told to return to see him since it is a neuromuscular problem, PO2 97%, told to restart CPAP also today continues to have ejaculatory  pain, to be referred to Dr. Retta Diones   Allergies: 1)  ! Seldane 2)  Amoxicillin (Amoxicillin)  Review of Systems      See HPI General:  Complains of weakness. ENT:  Complains of nasal congestion; denies decreased hearing, difficulty swallowing, ear discharge, earache, hoarseness, nosebleeds, postnasal drainage, ringing in ears, sinus pressure, and sore throat; improved with veramyst, URI better. CV:  Denies bluish discoloration of lips or nails, chest pain or discomfort, difficulty breathing at night, difficulty breathing while lying down, fainting, fatigue, leg cramps with exertion, lightheadness, near fainting, palpitations, shortness of breath with exertion, swelling of feet, swelling of hands, and weight gain. Resp:  Denies chest discomfort, chest pain with inspiration, cough, coughing up blood, excessive snoring, hypersomnolence, morning headaches, pleuritic, shortness of breath, sputum productive, and wheezing. GI:  Denies abdominal pain, bloody  stools, change in bowel habits, constipation, dark tarry stools, diarrhea, excessive appetite, gas, hemorrhoids, indigestion, loss of appetite, nausea, vomiting, vomiting blood, and yellowish skin color. GU:  ejaculatory pain. MS:  Denies joint pain, joint redness, joint swelling, loss of strength, low back pain, mid back pain, muscle aches, muscle , cramps, muscle weakness, stiffness, and thoracic pain.  Physical Exam  General:  Well-developed,well-nourished,in no acute distress; alert,appropriate and cooperative throughout examination Head:  forehead rt side near hairline laceration wll healed, sutures removed Eyes:  No corneal or conjunctival inflammation noted. EOMI. Perrla. Funduscopic exam benign, without hemorrhages, exudates or papilledema. Vision grossly normal. Ears:  External ear exam shows no significant lesions or deformities.  Otoscopic examination reveals clear canals, tympanic membranes are intact bilaterally without bulging, retraction, inflammation or discharge. Hearing is grossly normal bilaterally. Nose:  improved, nasal congestion Mouth:  Oral mucosa and oropharynx without lesions or exudates.  Teeth in good repair. Lungs:  Normal respiratory effort, chest expands symmetrically. Lungs are clear to auscultation, no crackles or wheezes. Heart:  Normal rate and regular rhythm. S1 and S2 normal without gallop, murmur, click, rub or other extra sounds. Rectal:  NE Genitalia:  NE Skin:  10 cm laceration rt forehead well healed   Impression & Recommendations:  Problem # 1:  LACERATION, FOREHEAD (ZOX-096.04) Assessment Improved sutures removed  Problem # 2:  ABNORMAL EJACULATION (ICD-608.89) Assessment: Unchanged  painful ejaculation  Orders: Urology Referral (Urology)  Problem # 3:  MYALGIA (ICD-729.1) Assessment: Unchanged  His updated medication list  for this problem includes:    Naprosyn 125 Mg/67ml Susp (Naproxen) .Marland KitchenMarland KitchenMarland KitchenMarland Kitchen 4 tsp am and pm  Problem # 4:  RHINITIS  (ICD-477.9) Assessment: Improved  His updated medication list for this problem includes:    Astepro 0.15 % Soln (Azelastine hcl) .Marland Kitchen... 2  sprays  morning and  midafternoon Nasonex  Problem # 5:  ARTHRITIS (ICD-716.90) Assessment: Unchanged  Problem # 6:  GERD (ICD-530.81) Assessment: Improved  His updated medication list for this problem includes:    Omeprazole 40 Mg Cpdr (Omeprazole) .Marland Kitchen... 1 two times a day  Complete Medication List: 1)  Diazepam 5 Mg Tabs (Diazepam) .Marland Kitchen.. 1 tablet by mouth three times a day 2)  Finasteride 5 Mg Tabs (Finasteride) .Marland Kitchen.. 1 qd 3)  Imipramine Hcl 25 Mg Tabs (Imipramine hcl) .Marland Kitchen.. 1 tid 4)  Omeprazole 40 Mg Cpdr (Omeprazole) .Marland Kitchen.. 1 two times a day 5)  Hydroxyzine Hcl 25 Mg Tabs (Hydroxyzine hcl) .Marland Kitchen.. 1 by mouth three times a day 6)  Naprosyn 125 Mg/52ml Susp (Naproxen) .... 4 tsp am and pm 7)  Tylenol Codeine Liquid  .Marland Kitchen.. 2-3 tsp q4h as needed pain 8)  Entex Liquid  .... 2 tsp three times a day for congestion 9)  Astepro 0.15 % Soln (Azelastine hcl) .... 2  sprays  morning and  midafternoon 10)  Analpram-hc Singles 1-2.5 % Crea (Hydrocortisone ace-pramoxine) .... Insert 1 applicator two times a day 11)  Levaquin 500 Mg Tabs (Levofloxacin) .Marland Kitchen.. 1 qd  Patient Instructions: 1)  Removed sutures, healing nicely 2)  To make appt Dr. Retta Diones 3)  Recommend returning to see Dr. Sandria Manly

## 2010-07-18 NOTE — Progress Notes (Signed)
Summary: rx diazepam 5mg  w 5 refills  Phone Note From Pharmacy   Caller[facility]  [address] [phone]* Reason for Call: Needs renewal Summary of Call: pt at drug store requesting diazepam  Initial call taken by: Pura Spice, RN,  May 09, 2009 10:56 AM  Follow-up for Phone Call        called ok'd by dr Alfonzo Feller  Follow-up by: Pura Spice, RN,  May 09, 2009 10:59 AM    New/Updated Medications: DIAZEPAM 5 MG TABS (DIAZEPAM) 1 tablet by mouth three times a day Prescriptions: DIAZEPAM 5 MG TABS (DIAZEPAM) 1 tablet by mouth three times a day  #90 x 5   Entered by:   Pura Spice, RN   Authorized by:   Judithann Sheen MD   Signed by:   Pura Spice, RN on 05/09/2009   Method used:   Telephoned to ...       [facility] [address] [phone]* (retail)       [address]       [place], [place]  [address]       Ph: [phone] or [phone]       Fax: [phone]   RxID:   [id number]

## 2010-07-18 NOTE — Progress Notes (Signed)
Summary: pt having muscle weakness in lft arm  Phone Note Call from Patient Call back at Home Phone [phone] Call back at [phone]   Caller: Patient Call For: Judithann Sheen MD Summary of Call: Patient called and said that he is still having weakness in his muscles, especially in lft arm. Pt is also very fatigued. Pt is wondering if it could be caused by the Simvastatin 20mg  TAB TEV prescribed by Dr. Genene Churn Love, Neurologist. Pt would like to rcv a call back to discuss what he should do.  Initial call taken by: Lucy Antigua,  January 22, 2009 4:15 PM  Follow-up for Phone Call        yes it could be from Simvastatin. I would stop this for one month to see if it goes away Follow-up by: Nelwyn Salisbury MD,  January 22, 2009 5:05 PM  Additional Follow-up for Phone Call Additional follow up Details #1::        Patient informed. Additional Follow-up by: Darra Lis RMA,  January 23, 2009 8:06 AM

## 2010-07-18 NOTE — Progress Notes (Signed)
Summary: REQUEST FOR ORDER (COLONOSCOPY)  Phone Note From Other Clinic   Caller: Receptionist  [phone] Summary of Call: [facility] adv that they can go ahead and schedule the pts colonoscopy instead of pt having to come in for OV and then come back for colonoscopy..... They adv since pt has already been evaluated by [facility] (Dr Russella Dar) then they would not have to schedule pt to come in for OV prior to colonoscopy, they just request that the referral form / order be faxed to them so appt can be scheduled..... Can you advise on order for colonoscopy through [facility]?  Initial call taken by: Debbra Riding,  February 15, 2010 11:27 AM  Follow-up for Phone Call        OK to place order for colonoscopy with [facility] Follow-up by: Danise Edge MD,  February 15, 2010 11:28 AM     Appended Document: REQUEST FOR ORDER (COLONOSCOPY) per Aurther Loft paperwork just needs to be signed by MD.

## 2010-07-18 NOTE — Letter (Signed)
Summary: Candi Leash and No Reschedule  [facility]  [address] [place], [place] [address]   Phone: [phone]  Fax: [phone]      February 14, 2010 MRN: [id number]   Dann CUDA [address] [place], [place]  [address]     You recently cancelled your colonoscopy procedure at the [facility] and did not reschedule for another date.    Your provider recommended this procedure for the benefit of your health.  It is very important that you reschedule it.  Failure to do so may be to the detriment of your health.  Please call us at [phone] and we will be happy to assist you with rescheduling.    If you were referred for this procedure by another physician/provider, we will notify him/her that you did not keep your appointment.   Sincerely,   [facility]

## 2010-07-18 NOTE — Assessment & Plan Note (Signed)
Summary: rocephin inj/njr   Nurse Visit    Prior Medications: DIAZEPAM 5 MG TABS (DIAZEPAM) 1 tablet by mouth three times a day FINASTERIDE 5 MG  TABS (FINASTERIDE) 1 qd IMIPRAMINE HCL 25 MG  TABS (IMIPRAMINE HCL) 1 tid TYLENOL 325 MG  TABS (ACETAMINOPHEN) prn OMEPRAZOLE 40 MG  CPDR (OMEPRAZOLE) 1 two times a day HYDROXYZINE HCL 25 MG  TABS (HYDROXYZINE HCL) 1 by mouth three times a day NAPROSYN 125 MG/5ML  SUSP (NAPROXEN) 4 tsp AM and PM ANALPRAM-HC SINGLES 1-2.5 % CREA (HYDROCORTISONE ACE-PRAMOXINE) Insert i applicator two times a day for proctitis TYLENOL CODEINE LIQUID () 2-3 tsp q4h as needed pain ENTEX LIQUID () 2 tsp three times a day for congestion ASTEPRO 0.15 % SOLN (AZELASTINE HCL) 2  sprays  morning and  midafternoon AZITHROMYCIN SUSP () take 500mg  x 1 day then 250mg  x 4 days Current Allergies: ! SELDANE AMOXICILLIN (AMOXICILLIN)    Medication Administration  Injection # 1:    Medication: Rocephin  250mg     Diagnosis: ACUTE BRONCHITIS (ICD-466.0)    Route: IM    Site: LUOQ gluteus    Exp Date: 01/2011    Lot #: [id number]    Mfr: novaplus    Comments: 1 gram given     Patient tolerated injection without complications    Given by: Pura Spice, RN (August 22, 2008 4:48 PM)  Orders Added: 1)  Rocephin  250mg  [J0696] 2)  Admin of Therapeutic Inj  intramuscular or subcutaneous Lepidus.Putnam    ]

## 2010-07-18 NOTE — Progress Notes (Signed)
Summary: ears stopped up cold sx's   Phone Note Call from Patient   Caller: Patient Summary of Call: ears stopped up  cough non productive sore throat.  allergic to amoxicillin.    call [phone] [facility]  Initial call taken by: Pura Spice, RN,  August 17, 2008 12:38 PM  Follow-up for Phone Call        per dr Scotty Court call in azithromycin susp 500mg  x 1 day then 250mg  x 4 days  [facility] spoke to phil pt notified and informed if no better needs appt with dr Scotty Court on monday.  Follow-up by: Pura Spice, RN,  August 17, 2008 2:01 PM    New/Updated Medications: * AZITHROMYCIN SUSP take 500mg  x 1 day then 250mg  x 4 days

## 2010-07-18 NOTE — Assessment & Plan Note (Signed)
Summary: GROIN PAIN // RS   Vital Signs:  Patient profile:   50 year old male Weight:      192 pounds O2 Sat:      96 % Temp:     98.3 degrees F Pulse rate:   112 / minute BP sitting:   140 / 88  (left arm) Cuff size:   regular  Vitals Entered By: Pura Spice, RN (October 04, 2009 3:41 PM) CC: wants area left leg /testicle ck'd and c/o ?bite left shoulder and left hand . states has not had his finasteride in 5 days    History of Present Illness: This 50 year old white male with multiple problems is in today complaining of rectal bleeding when he has a bowel movement this occurs episodically Other complaint is sore fullness in his ears and some nasal congestion He complains of pain in the left inguinal region pulling the testicle and his leg\par Concerned about possible insect bite on his right hand Relates he has not taken Proscar in the past 5 days  Allergies: 1)  ! Seldane 2)  Amoxicillin (Amoxicillin)  Past History:  Past Medical History: Last updated: 05/02/2009 Allergic rhinitis Headache PITUITARY ADENOMA nEPROLETHIASIS  Past Surgical History: Last updated: 12/23/2006 Cataract extraction Kidney Stone Back and neck surgery  Risk Factors: Smoking Status: quit (02/19/2007)  Review of Systems      See HPI  The patient denies anorexia, fever, weight loss, weight gain, vision loss, decreased hearing, hoarseness, chest pain, syncope, dyspnea on exertion, peripheral edema, prolonged cough, headaches, hemoptysis, abdominal pain, melena, hematochezia, severe indigestion/heartburn, hematuria, incontinence, genital sores, muscle weakness, suspicious skin lesions, transient blindness, difficulty walking, depression, unusual weight change, abnormal bleeding, enlarged lymph nodes, angioedema, breast masses, and testicular masses.    Physical Exam  General:  Well-developed,well-nourished,in no acute distress; alert,appropriate and cooperative throughout examination Head:   Normocephalic and atraumatic without obvious abnormalities. No apparent alopecia or balding. Eyes:  decreased vision corrected with glasses Ears:  External ear exam shows no significant lesions or deformities.  Otoscopic examination reveals clear canals, tympanic membranes are intact bilaterally without bulging, retraction, inflammation or discharge. Hearing is grossly normal bilaterally. Nose:  nasal congestion mucosa edematous with clear drainage Mouth:  Oral mucosa and oropharynx without lesions or exudates.  Teeth in good repair. Lungs:  Normal respiratory effort, chest expands symmetrically. Lungs are clear to auscultation, no crackles or wheezes. Heart:  Normal rate and regular rhythm. S1 and S2 normal without gallop, murmur, click, rub or other extra sounds. Abdomen:  Bowel sounds positive,abdomen soft and non-tender without masses, organomegaly or hernias noted. Rectal:  evident proctitis anal mucosa erythematous, no hemorrhoids no masses Genitalia:  left testicle normal no lymphadenopathy nor tenderness is in the left and worried Prostate:  Prostate gland firm and smooth, no enlargement, nodularity, tenderness, mass, asymmetry or induration. Msk:  small erythematous area which may be a insect bite and no evidence of saline as Extremities:  No clubbing, cyanosis, edema, or deformity noted with normal full range of motion of all joints.     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  Problem # 4:  GERD (ICD-530.81) Assessment: Deteriorated had stopped omeprazole but instructed to restart His updated medication list for this problem includes:    Omeprazole 40 Mg Cpdr (Omeprazole) .Marland KitchenMarland KitchenMarland KitchenMarland Kitchen 1  two times a day for gerd  Complete  Medication List: 1)  Diazepam 5 Mg Tabs (Diazepam) .Marland Kitchen.. 1 tablet by mouth three times a day 2)  Imipramine Hcl 25 Mg Tabs (Imipramine hcl) .Marland Kitchen.. 1 tid 3)  Omeprazole 40 Mg Cpdr (Omeprazole) .Marland Kitchen.. 1 two times a day for gerd 4)  Hydroxyzine Hcl 25 Mg Tabs (Hydroxyzine hcl) .Marland Kitchen.. 1 by mouth three times a day 5)  Naprosyn 125 Mg/68ml Susp (Naproxen) .... 4 tsp am and pm 6)  Tylenol Codeine Liquid  .Marland Kitchen.. 2-3 tsp q4h as needed pain 7)  Entex Liquid  .... 2 tsp three times a day for congestion 8)  Analpram-hc Singles 1-2.5 % Crea (Hydrocortisone ace-pramoxine) .... Insert 1 applicator two times a day 9)  Cortisporin 3.5-10000-1 Soln (Neomycin-polymyxin-hc) .... 4 qtts in each ear tid 10)  Omnaris 50 Mcg/act Susp (Ciclesonide) .... 2 sprays each nostril each day  Patient Instructions: 1)  Proctitis with bleeding, use analpram HC two times a day 2)  omnaris daily should help sensation in ears 3)  restart omeprazole 4)  do not restart finesterid Prescriptions: ANALPRAM-HC SINGLES 1-2.5 % CREA (HYDROCORTISONE ACE-PRAMOXINE) insert 1 applicator two times a day  #1pkge x 11   Entered and Authorized by:   Judithann Sheen MD   Signed by:   Judithann Sheen MD on 10/04/2009   Method used:   [facility]        [facility] [address] [phone]* (retail)       [address]       [place], [place]  [address]       Ph: [phone] or [phone]       Fax: [phone]   RxID:   [id number] IMIPRAMINE HCL 25 MG  TABS (IMIPRAMINE HCL) 1 tid  #90 x 11   Entered and Authorized by:   Judithann Sheen MD   Signed by:   Judithann Sheen MD on 10/04/2009   Method used:   [facility]ically [facility]        [facility] [address] [phone]* (retail)       [address]       [place], [place]  [address]       Ph: [phone] or [phone]       Fax: [phone]   RxID:   [id number]

## 2010-07-18 NOTE — Assessment & Plan Note (Signed)
Summary: depression,ear problem/jls   Vital Signs:  Patient Profile:   50 Years Old Male Weight:      187 pounds Temp:     98.1 degrees F Pulse rate:   109 / minute BP sitting:   124 / 86  (left arm) Cuff size:   regular  Vitals Entered By: Pura Spice, RN (December 09, 2007 8:27 AM)                 Chief Complaint:  multiple problems .  History of Present Illness: Pt complains of depression, loss sex drive, continues to have back pain Now has hearing aids which are being adjusted at this time settled on accident injurie takes Naproxen as needed Indigestion ocasionally,  uses antacids Has had low tstosterone level in past and will check today No other complaints    Current Allergies: ! SELDANE AMOXICILLIN (AMOXICILLIN)     Review of Systems      See HPI   Physical Exam  General:     Well-developed,well-nourished,in no acute distress; alert,appropriate and cooperative throughout examination Ears:     rt ear nl, left ear ext otitis Nose:     External nasal examination shows no deformity or inflammation. Nasal mucosa are pink and moist without lesions or exudates. Mouth:     Oral mucosa and oropharynx without lesions or exudates.  Teeth in good repair. Lungs:     Normal respiratory effort, chest expands symmetrically. Lungs are clear to auscultation, no crackles or wheezes. Heart:     Normal rate and regular rhythm. S1 and S2 normal without gallop, murmur, click, rub or other extra sounds. Abdomen:     Bowel sounds positive,abdomen soft and non-tender without masses, organomegaly or hernias noted. Rectal:     No external abnormalities noted. Normal sphincter tone. No rectal masses or tenderness. Genitalia:     Testes bilaterally descended without nodularity, tenderness or masses. No scrotal masses or lesions. No penis lesions or urethral discharge. Prostate:     Prostate gland firm and smooth, no enlargement, nodularity, tenderness, mass, asymmetry or  induration. Msk:     No deformity or scoliosis noted of thoracic or lumbar spine.   Pulses:     R and L carotid,radial,femoral,dorsalis pedis and posterior tibial pulses are full and equal bilaterally Extremities:     No clubbing, cyanosis, edema, or deformity noted with normal full range of motion of all joints.   Neurologic:     No cranial nerve deficits noted. Station and gait are normal. Plantar reflexes are down-going bilaterally. DTRs are symmetrical throughout. Sensory, motor and coordinative functions appear intact. Psych:     depressed affect.      Impression & Recommendations:  Problem # 1:  FATIGUE (ICD-780.79) Assessment: New  Orders: TLB-Testosterone, Total (84403-TESTO)   Problem # 2:  ANXIETY DEPRESSION (ICD-300.4) Assessment: Deteriorated  Problem # 3:  EXTERNAL OTITIS (ICD-380.10) Assessment: New  His updated medication list for this problem includes:    Cortisporin 3.5-10000-1 Soln (Neomycin-polymyxin-hc) ..... Instill 4 drops to left ear four times a day   Problem # 4:  BACK PAIN (ICD-724.5) Assessment: Unchanged  The following medications were removed from the medication list:    Naproxen 500 Mg Tabs (Naproxen) .Marland Kitchen... Prn  His updated medication list for this problem includes:    Tylenol 325 Mg Tabs (Acetaminophen) .Marland Kitchen... 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 1 tablet by mouth once a day    Omeprazole 40 Mg Cpdr (Omeprazole) .Marland Kitchen... 1 once daily    Omeprazole 40 Mg Cpdr (Omeprazole) .Marland Kitchen... 1 qd   Complete Medication List: 1)  Aciphex 20 Mg Tbec (Rabeprazole sodium) .Marland Kitchen.. 1 tablet by mouth once a day 2)  Diazepam 5 Mg Tabs  (Diazepam) .Marland Kitchen.. 1 tablet by mouth three times a day 3)  Fexofenadine Hcl 180 Mg Tabs (Fexofenadine hcl) .... Take 1 tablet by mouth once a day 4)  Finasteride 5 Mg Tabs (Finasteride) .Marland Kitchen.. 1 qd 5)  Imipramine Hcl 25 Mg Tabs (Imipramine hcl) .Marland Kitchen.. 1 tid 6)  Tylenol 325 Mg Tabs (Acetaminophen) .... Prn 7)  Flonase 50 Mcg/act Susp (Fluticasone propionate) .... 2 sprays once daily prn 8)  Omeprazole 40 Mg Cpdr (Omeprazole) .Marland Kitchen.. 1 once daily 9)  Hydroxyzine Hcl 25 Mg Tabs (Hydroxyzine hcl) .Marland Kitchen.. 1 by mouth three times a day 10)  Cymbalta 60 Mg Cpep (Duloxetine hcl) .Marland Kitchen.. 1 qd 11)  Naprosyn 125 Mg/43ml Susp (Naproxen) .... 4 tsp am and pm 12)  Omeprazole 40 Mg Cpdr (Omeprazole) .Marland Kitchen.. 1 qd 13)  Cortisporin 3.5-10000-1 Soln (Neomycin-polymyxin-hc) .... Instill 4 drops to left ear four times a day  Other Orders: UA Dipstick w/o Micro (manual) ([address])   Patient Instructions: 1)  continue regular medicines 2)  continue  imipramine Add cymbalta 60 mg once daily 3)  start 1week 30 mg 4)  Take Naproxen at least once per day 5)  sples prilosec 40 mg each day 6)  add cymbalta 60 mg for depression   Prescriptions: CORTISPORIN 3.5-10000-1  SOLN (NEOMYCIN-POLYMYXIN-HC) instill 4 drops to left ear four times a day  #10 cc x 1   Entered by:   Pura Spice, RN   Authorized by:   Judithann Sheen MD   Signed by:   Pura Spice, RN on 12/09/2007   Method used:   [facility] to ...       [id number]       [address]       [place], [place]  [address]       Ph: [phone] or [phone]       Fax: [phone]   RxID:   [id number] OMEPRAZOLE 40 MG  CPDR (OMEPRAZOLE) 1 qd  #30 x 0   Entered and Authorized by:   Judithann Sheen MD   Signed by:   Judithann Sheen MD on 12/09/2007   Method used:   Electronically sent to ...       [id number]       [address]       [place], [place]  [address]       Ph: [phone] or  [phone]       Fax: [phone]   RxID:   [id number] NAPROSYN 125 MG/5ML  SUSP (NAPROXEN) 4 tsp AM and PM  #1200cc x 11   Entered and Authorized by:   Judithann Sheen MD   Signed by:   Judithann Sheen MD on 12/09/2007   Method used:   Electronically sent to ...       [facility] [phone]*  [address]       [place], [place]  [address]       Ph: [phone] or [phone]       Fax: [phone]   RxID:   [id number]  ]

## 2010-07-18 NOTE — Progress Notes (Signed)
  Phone Note Call from Patient   Caller: Patient Call For: Judithann Sheen MD Summary of Call: Pt is asking for meds for migraine headaches and wants Tylenol # 3 or Vicodin.......[phone] Initial call taken by: Lynann Beaver CMA AAMA,  June 18, 2010 2:17 PM  Follow-up for Phone Call        called in vicodin liquid Follow-up by: Judithann Sheen MD,  June 18, 2010 5:52 PM

## 2010-07-18 NOTE — Assessment & Plan Note (Signed)
Summary: bleeding hemorrhoids/dm   Vital Signs:  Patient profile:   50 year old male Weight:      183 pounds Pulse rate:   100 / minute BP sitting:   124 / 82  (left arm)  Vitals Entered By: Kyung Rudd, CMA (June 27, 2010 11:47 AM) CC: pt c/o bleeding hemorroids    Primary Care Provider:  Rickard Patience, MD  CC:  pt c/o bleeding hemorroids .  History of Present Illness: Patient presents to clinic as a workin for evaluation of rectal bleeding. Recently seen with rectal bleeding typically BRBPR but also episodes of dark/maroon blood. Presents today with continued same and shows picture of BM with red blood and ?dark clots. Colonoscopy utd 8/11 and reviewed again with pt. No masses/polyps and no mention of hemorrhoids being present. Pt now believes may feel mild protuberance from rectum. No dizziness, presyncope or synocpe. Denies abd pain, N/v.  Current Medications (verified): 1)  Diazepam 5 Mg Tabs (Diazepam) .Marland Kitchen.. 1 Tablet By Mouth Three Times A Day As Needed 2)  Omeprazole 40 Mg  Cpdr (Omeprazole) .Marland Kitchen.. 1 Two Times A Day For Genella Rife As Needed (Holding) 3)  Hydroxyzine Hcl 25 Mg  Tabs (Hydroxyzine Hcl) .Marland Kitchen.. 1 By Mouth Three Times A Day As Needed 4)  Naprosyn 125 Mg/77ml  Susp (Naproxen) .... 4 Tsp Am and Pm As Needed 5)  Imipramine Hcl 25 Mg Tabs (Imipramine Hcl) .... Once Daily X4 Days, Then Two Times A Day X4 Days, Then Three Times A Day 6)  Cortisporin 3.5-10000-1 Soln (Neomycin-Polymyxin-Hc) .... 4 Drops B/l Ears Three Times A Day X 10 Days 7)  Nitrostat 0.4 Mg Subl (Nitroglycerin) .Marland Kitchen.. 1 Tab By Victorio Palm As Needed Cp Repeat Q X 3 Doses If Pain Is Persistent. If No Resolution After 3 Doses To Er 8)  Metoprolol Tartrate 25 Mg Tabs (Metoprolol Tartrate) .Marland Kitchen.. 1 Tab By Mouth By Mouth Two Times A Day, May Crush in Food 9)  Flonase 50 Mcg/act Susp (Fluticasone Propionate) .... 2 Sprays Each Nostril Daily 10)  Anusol-Hc 25 Mg Supp (Hydrocortisone Acetate) .Marland Kitchen.. 1 Pr Two Times A Day X  7 Days and Then As Needed Rectal Irritation After That 11)  Align 4 Mg Caps (Probiotic Product) .Marland Kitchen.. 1 Cap By Mouth Daily As Needed Antibiotic Use 12)  Hydrocodone-Acetaminophen 7.5-500 Mg/57ml Soln (Hydrocodone-Acetaminophen) .... 3 Tsp Q4-6h As Needed Pain, Not Over 3 Ttimes Per Day  Allergies (verified): 1)  ! Seldane 2)  Amoxicillin (Amoxicillin)  Past History:  Past medical, surgical, family and social histories (including risk factors) reviewed, and no changes noted (except as noted below).  Past Medical History: Reviewed history from 02/07/2010 and no changes required. Allergic rhinitis Headache PITUITARY ADENOMA NEPROLETHIASIS Stroke Sleep Apnea pineal gland cyst  Past Surgical History: Reviewed history from 02/07/2010 and no changes required. Cataract extraction three times osteod esteoma surgery right ear surgery  Family History: Reviewed history from 02/07/2010 and no changes required. Family History of Breast Cancer: mother father had hypertension  Social History: Reviewed history from 02/07/2010 and no changes required. Patient exposed to 2nd hand smoke (heavy) Alcohol Use - no Illicit Drug Use - no  Review of Systems      See HPI  Physical Exam  General:  Well-developed,well-nourished,in no acute distress; alert,appropriate and cooperative throughout examination Head:  Normocephalic and atraumatic without obvious abnormalities. No apparent alopecia or balding. Eyes:  pupils equal, pupils round, and corneas and lenses clear.   Ears:  no external deformities.  Nose:  no external deformity.   Rectal:  normal sphincter tone, no masses, no fissures, and no perianal rash.  +?residual hemorrhoidal tissue slightly tender. anoscopy attempted but aborted due to discomfort. ? distal dark blue/purple venous structure with +tenderness. no gross active bleeding.   Impression & Recommendations:  Problem # 1:  GI BLEEDING (ICD-578.9) Assessment  Unchanged Persistent bleeding and may represent distal hemorrhoidal source though not mentioned on recent colonscopy. Also concern over continued episodes of dark blood by description and pictures potentially suggesting less distal source of bleeding. Not anemic on recent CBC. Recommend proceeding with GI consult currently pending.  Complete Medication List: 1)  Diazepam 5 Mg Tabs (Diazepam) .Marland Kitchen.. 1 tablet by mouth three times a day as needed 2)  Omeprazole 40 Mg Cpdr (Omeprazole) .Marland Kitchen.. 1 two times a day for gerd as needed (holding) 3)  Hydroxyzine Hcl 25 Mg Tabs (Hydroxyzine hcl) .Marland Kitchen.. 1 by mouth three times a day as needed 4)  Naprosyn 125 Mg/79ml Susp (Naproxen) .... 4 tsp am and pm as needed 5)  Imipramine Hcl 25 Mg Tabs (Imipramine hcl) .... Once daily x4 days, then two times a day x4 days, then three times a day 6)  Cortisporin 3.5-10000-1 Soln (Neomycin-polymyxin-hc) .... 4 drops b/l ears three times a day x 10 days 7)  Nitrostat 0.4 Mg Subl (Nitroglycerin) .Marland Kitchen.. 1 tab by mouh sl as needed cp repeat q x 3 doses if pain is persistent. if no resolution after 3 doses to er 8)  Metoprolol Tartrate 25 Mg Tabs (Metoprolol tartrate) .Marland Kitchen.. 1 tab by mouth by mouth two times a day, may crush in food 9)  Flonase 50 Mcg/act Susp (Fluticasone propionate) .... 2 sprays each nostril daily 10)  Anusol-hc 25 Mg Supp (Hydrocortisone acetate) .Marland Kitchen.. 1 pr two times a day x 7 days and then as needed rectal irritation after that 11)  Align 4 Mg Caps (Probiotic product) .Marland Kitchen.. 1 cap by mouth daily as needed antibiotic use 12)  Hydrocodone-acetaminophen 7.5-500 Mg/26ml Soln (Hydrocodone-acetaminophen) .... 3 tsp q4-6h as needed pain, not over 3 ttimes per day   Orders Added: 1)  Est. Patient Level III [81191]

## 2010-07-18 NOTE — Progress Notes (Signed)
Summary: wanting to get sleep tudy results  Medications Added ACIPHEX 20 MG TBEC (RABEPRAZOLE SODIUM) 1 tablet by mouth once a day CYMBALTA 60 MG CPEP (DULOXETINE HCL) Take 2 capsule by mouth twice a day DIAZEPAM 5 MG TABS (DIAZEPAM) 1 tablet by mouth single dose FEXOFENADINE HCL 180 MG TABS (FEXOFENADINE HCL) Take 1 tablet by mouth once a day HYDROXYZINE PAMOATE 25 MG CAPS (HYDROXYZINE PAMOATE) Take 1-2 capsule by mouth at bedtime      Allergies Added: AMOXICILLIN (AMOXICILLIN) Phone Note Call from Patient Call back at Home Phone [phone]   Caller: Patient Call For: stafford Summary of Call: wanting to get sleep stuy results, sleep study center said they were sent to Dr Scotty Court Initial call taken by: Calvert Cantor,  January 20, 2007 2:22 PM  Follow-up for Phone Call        call pt and tell him we do not have sleep study ask him to call wherec he had them and have them fax over the results.  Sleep Study results were requested again from [facility] [phone], requested fax be sent to Dr Scotty Court. ..................................................................Marland KitchenSid Falcon LPN  January 20, 2007 4:43 PM  Follow-up by: Pura Spice, RN,  January 20, 2007 2:56 PM  Additional Follow-up for Phone Call Additional follow up Details #1::        Report received, given to Dr Scotty Court myself. Additional Follow-up by: Sid Falcon LPN,  January 20, 2007 4:58 PM   New Allergies: AMOXICILLIN (AMOXICILLIN) Additional Follow-up for Phone Call Additional follow up Details #2::    pt to be called by sr stafford per self Follow-up by: Judithann Sheen MD,  January 21, 2007 1:35 PM  New/Updated Medications: ACIPHEX 20 MG TBEC (RABEPRAZOLE SODIUM) 1 tablet by mouth once a day CYMBALTA 60 MG CPEP (DULOXETINE HCL) Take 2 capsule by mouth twice a day DIAZEPAM 5 MG TABS (DIAZEPAM) 1 tablet by mouth single dose FEXOFENADINE HCL 180 MG TABS (FEXOFENADINE HCL) Take 1  tablet by mouth once a day HYDROXYZINE PAMOATE 25 MG CAPS (HYDROXYZINE PAMOATE) Take 1-2 capsule by mouth at bedtime New Allergies: AMOXICILLIN (AMOXICILLIN)     Appended Document: wanting to get sleep tudy results Appt with Dr.Alva 08/14 @ 2:10/Spoke with patient 08/13 @ 3:00

## 2010-07-18 NOTE — Assessment & Plan Note (Signed)
Summary: MEDICATION CONCERNS // RS   Vital Signs:  Patient profile:   50 year old male Height:      69 inches (175.26 cm) Weight:      192 pounds (87.27 kg) O2 Sat:      97 % on Room air Temp:     98.4 degrees F (36.89 degrees C) oral Pulse rate:   93 / minute BP sitting:   150 / 100  (left arm) Cuff size:   regular  Vitals Entered By: Josph Macho RMA (April 26, 2010 3:48 PM)  O2 Flow:  Room air CC: Sinus infection got into mouth? X3 days/ CF Is Patient Diabetic? No   History of Present Illness: 50 yo caucasian male in today with a 3 day history of worsening congestion, HA, malaise, fatigue, felt feverish at a point, nasal congestion, rhinorrhea, some ear pressure b/l but R>L. Mild sore throat, mild nonproductive cough. No CP/palp/SOB. Had some ciprofloxacin left over and so he restarted it and actually feels slightly better now than he has in several days. No diarrhea/constipation/bloody or tarry stool.  He is complaining of longstanding history of dysuria, recurrent prostatitis and painfu/burning ejaculation. He notes some mild midback back pain but no abdominal pain  Current Medications (verified): 1)  Diazepam 5 Mg Tabs (Diazepam) .Marland Kitchen.. 1 Tablet By Mouth Three Times A Day As Needed 2)  Omeprazole 40 Mg  Cpdr (Omeprazole) .Marland Kitchen.. 1 Two Times A Day For Genella Rife As Needed (Holding) 3)  Hydroxyzine Hcl 25 Mg  Tabs (Hydroxyzine Hcl) .Marland Kitchen.. 1 By Mouth Three Times A Day As Needed 4)  Naprosyn 125 Mg/55ml  Susp (Naproxen) .... 4 Tsp Am and Pm As Needed 5)  Imipramine Hcl 25 Mg Tabs (Imipramine Hcl) .... Once Daily X4 Days, Then Two Times A Day X4 Days, Then Three Times A Day 6)  Cortisporin 3.5-10000-1 Soln (Neomycin-Polymyxin-Hc) .... 4 Drops B/l Ears Three Times A Day X 10 Days 7)  Nitrostat 0.4 Mg Subl (Nitroglycerin) .Marland Kitchen.. 1 Tab By Victorio Palm As Needed Cp Repeat Q X 3 Doses If Pain Is Persistent. If No Resolution After 3 Doses To Er 8)  Cipro 500 Mg Tabs (Ciprofloxacin Hcl) .Marland Kitchen.. 1 Tab By  Mouth Two Times A Day X 21 Days 9)  Metoprolol Tartrate 25 Mg Tabs (Metoprolol Tartrate) .Marland Kitchen.. 1 Tab By Mouth By Mouth Two Times A Day, May Crush in Food 10)  Flonase 50 Mcg/act Susp (Fluticasone Propionate) .... 2 Sprays Each Nostril Daily 11)  Anusol-Hc 25 Mg Supp (Hydrocortisone Acetate) .Marland Kitchen.. 1 Pr Two Times A Day X 7 Days and Then As Needed Rectal Irritation After That  Allergies (verified): 1)  ! Seldane 2)  Amoxicillin (Amoxicillin)  Past History:  Past medical history reviewed for relevance to current acute and chronic problems. Social history (including risk factors) reviewed for relevance to current acute and chronic problems.  Past Medical History: Reviewed history from 02/07/2010 and no changes required. Allergic rhinitis Headache PITUITARY ADENOMA NEPROLETHIASIS Stroke Sleep Apnea pineal gland cyst  Social History: Reviewed history from 02/07/2010 and no changes required. Patient exposed to 2nd hand smoke (heavy) Alcohol Use - no Illicit Drug Use - no  Review of Systems      See HPI  Physical Exam  General:  Well-developed,well-nourished,in no acute distress; alert,appropriate and cooperative throughout examination Head:  Normocephalic and atraumatic without obvious abnormalities. No apparent alopecia or balding. Mouth:  Oral mucosa and oropharynx without lesions or exudates.  Teeth in good repair. Neck:  No deformities, masses, or tenderness noted. Lungs:  Normal respiratory effort, chest expands symmetrically. Lungs are clear to auscultation, no crackles or wheezes. Heart:  Normal rate and regular rhythm. S1 and S2 normal without gallop, murmur, click, rub or other extra sounds. Abdomen:  Bowel sounds positive,abdomen soft and non-tender without masses, organomegaly or hernias noted. Extremities:  No clubbing, cyanosis, edema, or deformity noted with normal full range of motion of all joints.   Psych:  Cognition and judgment appear intact. Alert and cooperative  with normal attention span and concentration. No apparent delusions, illusions, hallucinations   Impression & Recommendations:  Problem # 1:  ELEVATED BP READING WITHOUT DX HYPERTENSION (ICD-796.2)  His updated medication list for this problem includes:    Metoprolol Tartrate 25 Mg Tabs (Metoprolol tartrate) .Marland Kitchen... 1 tab by mouth by mouth two times a day, may crush in food  Problem # 2:  SINUSITIS, ACUTE (ICD-461.9)  His updated medication list for this problem includes:    Cipro 500 Mg Tabs (Ciprofloxacin hcl) .Marland Kitchen... 1 tab by mouth two times a day x 21 days    Flonase 50 Mcg/act Susp (Fluticasone propionate) .Marland Kitchen... 2 sprays each nostril daily    Levaquin 500 Mg Tabs (Levofloxacin) .Marland Kitchen... 1 tab by mouth daily Is given a paper rx for Levaquin so he can take it to the pharmacy to see if he can afford it. If he cannot he will keep on his Ciprofloxacin two times a day and call us next week for another weeks worth of meds. He feels the Ciprofloxacin upsets his stomach some  Problem # 3:  ACUTE PROSTATITIS (ICD-601.0)  Orders: Urology Referral (Urology) Requesting referral for second opinion regarding his long standing c/o dysuria, painful ejaculation and recurrent prostatitis. Push clear fluids  Problem # 4:  GERD (ICD-530.81)  The following medications were removed from the medication list:    Ranitidine Hcl 75 Mg/67ml Syrp (Ranitidine hcl) .Marland Kitchen... 2 tsp by mouth two times a day His updated medication list for this problem includes:    Omeprazole 40 Mg Cpdr (Omeprazole) .Marland Kitchen... 1 two times a day for gerd as needed (holding) Try just Qd while taking antibiotics  Complete Medication List: 1)  Diazepam 5 Mg Tabs (Diazepam) .Marland Kitchen.. 1 tablet by mouth three times a day as needed 2)  Omeprazole 40 Mg Cpdr (Omeprazole) .Marland Kitchen.. 1 two times a day for gerd as needed (holding) 3)  Hydroxyzine Hcl 25 Mg Tabs (Hydroxyzine hcl) .Marland Kitchen.. 1 by mouth three times a day as needed 4)  Naprosyn 125 Mg/41ml Susp (Naproxen)  .... 4 tsp am and pm as needed 5)  Imipramine Hcl 25 Mg Tabs (Imipramine hcl) .... Once daily x4 days, then two times a day x4 days, then three times a day 6)  Cortisporin 3.5-10000-1 Soln (Neomycin-polymyxin-hc) .... 4 drops b/l ears three times a day x 10 days 7)  Nitrostat 0.4 Mg Subl (Nitroglycerin) .Marland Kitchen.. 1 tab by mouh sl as needed cp repeat q x 3 doses if pain is persistent. if no resolution after 3 doses to er 8)  Cipro 500 Mg Tabs (Ciprofloxacin hcl) .Marland Kitchen.. 1 tab by mouth two times a day x 21 days 9)  Metoprolol Tartrate 25 Mg Tabs (Metoprolol tartrate) .Marland Kitchen.. 1 tab by mouth by mouth two times a day, may crush in food 10)  Flonase 50 Mcg/act Susp (Fluticasone propionate) .... 2 sprays each nostril daily 11)  Anusol-hc 25 Mg Supp (Hydrocortisone acetate) .Marland Kitchen.. 1 pr two times a day x 7  days and then as needed rectal irritation after that 12)  Levaquin 500 Mg Tabs (Levofloxacin) .Marland Kitchen.. 1 tab by mouth daily 13)  Align 4 Mg Caps (Probiotic product) .Marland Kitchen.. 1 cap by mouth daily as needed antibiotic use  Patient Instructions: 1)  Please schedule a follow-up appointment in 1 month with Dr Scotty Court 2)  Push clear fluids, at least 64 oz  daily 3)  Try a ginger supplement three times a day  4)  Start a probiotic, such as Align daily  5)  Try Omeprazole midday if taking Ciprofloxacin two times a day or take Omeprazole in the eve and Levaquin in am if choose to try Levaquin. 6)  Do not take Levaquin and Ciprofloxacin together, can switch from one to the other but on separate days 7)  Acute sinusitis symptoms for less than 10 days are not helped by antibiotics. Use warm moist compresses, and over the counter decongestants( only as directed). Call if no improvement in 5-7 days, sooner if increasing pain, fever, or new symptoms.  Prescriptions: LEVAQUIN 500 MG TABS (LEVOFLOXACIN) 1 tab by mouth daily  #10 x 0   Entered and Authorized by:   Danise Edge MD   Signed by:   Danise Edge MD on 04/26/2010   Method  used:   Print then Give to Patient   RxID:   [id number]    Orders Added: 1)  Urology Referral [Urology] 2)  Est. Patient Level IV [44010]

## 2010-07-18 NOTE — Progress Notes (Signed)
Summary: left ear  Phone Note Call from Patient Call back at [phone]   Caller: patient live Call For: [facility] Summary of Call: Patient saw Dr Scotty Court today and he forgot to tell him what to do about his left ear.  Dr. looked at it so please advise and let him know what he should do. Initial call taken by: Celine Ahr,  December 09, 2007 10:07 AM  Follow-up for Phone Call        per dr Scotty Court call in cortisporin otic solution to [facility] Follow-up by: Pura Spice, RN,  December 09, 2007 12:56 PM         Laboratory Results   Urine Tests   Date/Time Reported: December 09, 2007 10:29 AM  Routine Urinalysis   Color: yellow Appearance: Clear Glucose: negative   (Normal Range: Negative) Bilirubin: negative   (Normal Range: Negative) Ketone: negative   (Normal Range: Negative) Spec. Gravity: >=1.030   (Normal Range: 1.003-1.035) Blood: negative   (Normal Range: Negative) pH: 5.5   (Normal Range: 5.0-8.0) Protein: negative   (Normal Range: Negative) Urobilinogen: 0.2   (Normal Range: 0-1) Nitrite: negative   (Normal Range: Negative) Leukocyte Esterace: negative   (Normal Range: Negative)    Comments: Darra Lis RMA  December 09, 2007 10:29 AM

## 2010-07-18 NOTE — Assessment & Plan Note (Signed)
Summary: rectal bleeding//slm   Vital Signs:  Patient profile:   50 year old male Weight:      188 pounds BP sitting:   142 / 90  (left arm)  Primary Care Provider:  Rickard Patience, MD   History of Present Illness: Patient presents to clinic as a workin for evaluation of retal bleeding. Notes 2wk h/o intermittent blood per rectum described as red and sometimes dark maroon. Denies associated abd pain, change in bowel habits, hematemesis, n/v or melena. Reviewed UTD colonoscopy/EGD within the year without obvious pathology. No current bleeding. No aleviating or exacerbating factors.   Allergies: 1)  ! Seldane 2)  Amoxicillin (Amoxicillin)  Review of Systems      See HPI  Physical Exam  General:  Well-developed,well-nourished,in no acute distress; alert,appropriate and cooperative throughout examination Head:  Normocephalic and atraumatic without obvious abnormalities. No apparent alopecia or balding. Eyes:  pupils equal, pupils round, corneas and lenses clear, and no injection.   Ears:  no external deformities.   Nose:  no external deformity.   Abdomen:  Bowel sounds positive,abdomen soft and non-tender without masses, organomegaly or hernias noted. Rectal:  normal sphincter tone, no masses, and no tenderness.  no obvious fissure or bleeding/irritated hemorrhoid. Dark brown fecal material on glove noted to be heme +. no gross active bleeding.   Impression & Recommendations:  Problem # 1:  GI BLEEDING (ICD-578.9) Assessment Deteriorated Discussed differential dx including benign and sinister etiologies. With maroon stools now confirmed heme + cannot exclude non distal bleeding source. Obtain CBC. If anemic or despite utd colon/egd if bleeding continues then recommend f/u GI visit.  Orders: Gastroenterology Referral (GI)  Complete Medication List: 1)  Diazepam 5 Mg Tabs (Diazepam) .Marland Kitchen.. 1 tablet by mouth three times a day as needed 2)  Omeprazole 40 Mg Cpdr (Omeprazole) .Marland Kitchen.. 1  two times a day for gerd as needed (holding) 3)  Hydroxyzine Hcl 25 Mg Tabs (Hydroxyzine hcl) .Marland Kitchen.. 1 by mouth three times a day as needed 4)  Naprosyn 125 Mg/23ml Susp (Naproxen) .... 4 tsp am and pm as needed 5)  Imipramine Hcl 25 Mg Tabs (Imipramine hcl) .... Once daily x4 days, then two times a day x4 days, then three times a day 6)  Cortisporin 3.5-10000-1 Soln (Neomycin-polymyxin-hc) .... 4 drops b/l ears three times a day x 10 days 7)  Nitrostat 0.4 Mg Subl (Nitroglycerin) .Marland Kitchen.. 1 tab by mouh sl as needed cp repeat q x 3 doses if pain is persistent. if no resolution after 3 doses to er 8)  Metoprolol Tartrate 25 Mg Tabs (Metoprolol tartrate) .Marland Kitchen.. 1 tab by mouth by mouth two times a day, may crush in food 9)  Flonase 50 Mcg/act Susp (Fluticasone propionate) .... 2 sprays each nostril daily 10)  Anusol-hc 25 Mg Supp (Hydrocortisone acetate) .Marland Kitchen.. 1 pr two times a day x 7 days and then as needed rectal irritation after that 11)  Align 4 Mg Caps (Probiotic product) .Marland Kitchen.. 1 cap by mouth daily as needed antibiotic use   Orders Added: 1)  Gastroenterology Referral [GI] 2)  Est. Patient Level IV [60454]

## 2010-07-18 NOTE — Assessment & Plan Note (Signed)
Summary: cpx//ccm/pt rescd from bump//ccm rsc to doc staff/njr/PT [facility] ...   Vital Signs:  Patient profile:   50 year old male Height:      69 inches Weight:      186 pounds BMI:     27.57 O2 Sat:      96 % on Room air Temp:     98.6 degrees F oral Pulse rate:   99 / minute Pulse rhythm:   regular BP sitting:   140 / 90  (left arm) Cuff size:   regular  Vitals Entered By: Kern Reap CMA Duncan Dull) (June 13, 2010 1:53 PM)  O2 Flow:  Room air CC: yearly physical Is Patient Diabetic? No Pain Assessment Patient in pain? no        CC:  yearly physical.  History of Present Illness: tHHIS 50 YR OLD DIVORCED MALE is in for complete physical examination Had rectal bleeding  was seen by gastroenterologist Dr. Dahlia Bailiff and has a return appointment Patient continues to have abnormal exact elevation with pain and has been seen by urologist for many of these as well as seen B. Today for the exam he has his usual complaints of nasal congestion abdominal pain or epigastric pain also complains of itching tender ear canals he has been seen by the cardiologist with no specific diagnosis made He has chronic anxiety depression with and takes imipramine 2 tabs at bedtime As repeated pulmonologist Dr. Russella Dar regarding rectal bleeding  Allergies: 1)  ! Seldane 2)  Amoxicillin (Amoxicillin)  Past History:  Past Medical History: Last updated: 02/07/2010 Allergic rhinitis Headache PITUITARY ADENOMA NEPROLETHIASIS Stroke Sleep Apnea pineal gland cyst  Past Surgical History: Last updated: 02/07/2010 Cataract extraction three times osteod esteoma surgery right ear surgery  Family History: Last updated: 02/07/2010 Family History of Breast Cancer: mother father had hypertension  Social History: Last updated: 02/07/2010 Patient exposed to 2nd hand smoke (heavy) Alcohol Use - no Illicit Drug Use - no  Risk Factors: Smoking Status: quit (02/19/2007)  Review of Systems   See HPI General:  See HPI; Complains of malaise. Eyes:  Complains of vision loss-both eyes; has some ileal retinal problems and wears thick lens glasses. ENT:  Complains of nasal congestion. CV:  Complains of fatigue; has cardiologist, ferrous Nitrostat for chest pain. Resp:  Denies chest discomfort, chest pain with inspiration, cough, coughing up blood, excessive snoring, hypersomnolence, morning headaches, pleuritic, shortness of breath, sputum productive, and wheezing. GI:  Complains of abdominal pain, bloody stools, gas, and indigestion; seen Dr. mild home  Russella Dar for all the above problems. GU:  See HPI; Complains of dysuria; pain with ejaculation history of nephrolithiasis. MS:  Denies joint pain, joint redness, joint swelling, loss of strength, low back pain, mid back pain, muscle aches, muscle , cramps, muscle weakness, stiffness, and thoracic pain. Derm:  Denies changes in color of skin, changes in nail beds, dryness, excessive perspiration, flushing, hair loss, insect bite(s), itching, lesion(s), poor wound healing, and rash. Neuro:  Complains of headaches; past pituitary adenoma followed by neurologist. Psych:  Complains of anxiety and depression.  Physical Exam  General:  Well-developed,well-nourished,in no acute distress; alert,appropriate and cooperative throughout examination Head:  Normocephalic and atraumatic without obvious abnormalities. No apparent alopecia or balding. Eyes:  No corneal or conjunctival inflammation noted. EOMI. Perrla. Funduscopic exam benign, without hemorrhages, exudates or papilledema. Vision grossly normal.decreased vision Ears:  External ear exam shows no significant lesions or deformities.  Otoscopic examination reveals clear canals, tympanic membranes are intact  bilaterally without bulging, retraction, inflammation or discharge. Hearing is grossly normal bilaterally. Nose:  External nasal examination shows no deformity or inflammation. Nasal mucosa are  pink and moist without lesions or exudates. Mouth:  Oral mucosa and oropharynx without lesions or exudates.  Teeth in good repair. Neck:  No deformities, masses, or tenderness noted. Chest Wall:  No deformities, masses, tenderness or gynecomastia noted. Breasts:  No masses or gynecomastia noted Lungs:  Normal respiratory effort, chest expands symmetrically. Lungs are clear to auscultation, no crackles or wheezes. Heart:  Normal rate and regular rhythm. S1 and S2 normal without gallop, murmur, click, rub or other extra sounds. Abdomen:  Bowel sounds positive,abdomen soft and non-tender without masses, organomegaly or hernias noted. Rectal:  No external abnormalities noted. Normal sphincter tone. No rectal masses or tenderness. Genitalia:  Testes bilaterally descended without nodularity, tenderness or masses. No scrotal masses or lesions. No penis lesions or urethral discharge. Prostate:  Prostate gland firm and smooth, no enlargement, nodularity, tenderness, mass, asymmetry or induration. Msk:  No deformity or scoliosis noted of thoracic or lumbar spine.   Pulses:  R and L carotid,radial,femoral,dorsalis pedis and posterior tibial pulses are full and equal bilaterally Extremities:  No clubbing, cyanosis, edema, or deformity noted with normal full range of motion of all joints.   Neurologic:  No cranial nerve deficits noted. Station and gait are normal. Plantar reflexes are down-going bilaterally. DTRs are symmetrical throughout. Sensory, motor and coordinative functions appear intact. Skin:  Intact without suspicious lesions or rashes Cervical Nodes:  No lymphadenopathy noted Axillary Nodes:  No palpable lymphadenopathy Inguinal Nodes:  No significant adenopathy Psych:  Cognition and judgment appear intact. Alert and cooperative with normal attention span and concentration. No apparent delusions, illusions, hallucinations   Impression & Recommendations:  Problem # 1:  PHYSICAL EXAMINATION  (ICD-V70.0) Assessment New  Problem # 2:  GI BLEEDING (ICD-578.9) Assessment: Improved  Problem # 3:  EPIGASTRIC PAIN (ICD-789.06) Assessment: Unchanged  Problem # 4:  CHEST PAIN, ATYPICAL (ICD-786.59) Assessment: Improved  Problem # 5:  ARTHRITIS (ICD-716.90) Assessment: Unchanged  Problem # 6:  GERD (ICD-530.81) Assessment: Improved  His updated medication list for this problem includes:    Omeprazole 40 Mg Cpdr (Omeprazole) .Marland Kitchen... 1 two times a day for gerd as needed (holding)  Problem # 7:  ANXIETY DEPRESSION (ICD-300.4) Assessment: Unchanged  Complete Medication List: 1)  Diazepam 5 Mg Tabs (Diazepam) .Marland Kitchen.. 1 tablet by mouth three times a day as needed 2)  Omeprazole 40 Mg Cpdr (Omeprazole) .Marland Kitchen.. 1 two times a day for gerd as needed (holding) 3)  Hydroxyzine Hcl 25 Mg Tabs (Hydroxyzine hcl) .Marland Kitchen.. 1 by mouth three times a day as needed 4)  Naprosyn 125 Mg/61ml Susp (Naproxen) .... 4 tsp am and pm as needed 5)  Imipramine Hcl 25 Mg Tabs (Imipramine hcl) .... Once daily x4 days, then two times a day x4 days, then three times a day 6)  Cortisporin 3.5-10000-1 Soln (Neomycin-polymyxin-hc) .... 4 drops b/l ears three times a day x 10 days 7)  Nitrostat 0.4 Mg Subl (Nitroglycerin) .Marland Kitchen.. 1 tab by mouh sl as needed cp repeat q x 3 doses if pain is persistent. if no resolution after 3 doses to er 8)  Metoprolol Tartrate 25 Mg Tabs (Metoprolol tartrate) .Marland Kitchen.. 1 tab by mouth by mouth two times a day, may crush in food 9)  Flonase 50 Mcg/act Susp (Fluticasone propionate) .... 2 sprays each nostril daily 10)  Anusol-hc 25 Mg Supp (Hydrocortisone acetate) .Marland KitchenMarland KitchenMarland Kitchen  1 pr two times a day x 7 days and then as needed rectal irritation after that 11)  Align 4 Mg Caps (Probiotic product) .Marland Kitchen.. 1 cap by mouth daily as needed antibiotic use 12)  Hydrocodone-acetaminophen 7.5-500 Mg/79ml Soln (Hydrocodone-acetaminophen) .... 3 tsp q4-6h as needed pain, not over 3 ttimes per day  Patient Instructions: 1)   Physical examination good except have fungal infection toes rt foot 2)  buy lotrimen, clortrimezole generic, apply to toesbid for at least 2 weeks 3)  refilled other medications 4)  take imipramine 2 bedtime 5)  Keep appt with Dr. Claudette Head Prescriptions: HYDROCODONE-ACETAMINOPHEN 7.5-500 MG/15ML SOLN (HYDROCODONE-ACETAMINOPHEN) 3 tsp q4-6h as needed pain, not over 3 ttimes per day  #500 cc x 1   Entered and Authorized by:   Judithann Sheen MD   Signed by:   Judithann Sheen MD on 06/24/2010   Method used:   Telephoned to ...       [facility] [phone]* (retail)       [address]       [place], [place]  [address]       Ph: [phone] or [phone]       Fax: [phone]   RxID:   [id number] OMEPRAZOLE 40 MG  CPDR (OMEPRAZOLE) 1 two times a day for gerd as needed (holding)  #60 x 11   Entered and Authorized by:   Judithann Sheen MD   Signed by:   Judithann Sheen MD on 06/13/2010   Method used:   Electronically to        [facility] [phone]* (retail)       [address]       [place], [place]  [address]       Ph: [phone] or [phone]       Fax: [phone]   RxID:   [id number] NAPROSYN 125 MG/5ML  SUSP (NAPROXEN) 4 tsp AM and PM as needed  #500cc x 11   Entered and Authorized by:   Judithann Sheen MD   Signed by:   Judithann Sheen MD on 06/13/2010   Method used:   [facility]ronically to        [facility] [phone]* (retail)       [address]       [place], [place]  [address]       Ph: [phone] or [phone]       Fax: [phone]   RxID:   [id number]    Orders Added: 1)  Est. Patient 40-64 years [phone]

## 2010-07-18 NOTE — Progress Notes (Signed)
  Phone Note Refill Request   Refills Requested: Medication #1:  FLUNISOLIDE 29 MCG/ACT SOLN 2 sprays each nostril two times a day   Dosage confirmed as above?Dosage Confirmed Pt states RX is still not at pharmacy.. MD sent twice yesterday  Initial call taken by: Josph Macho RMA,  February 05, 2010 11:55 AM    Prescriptions: FLUNISOLIDE 29 MCG/ACT SOLN (FLUNISOLIDE) 2 sprays each nostril two times a day  #1 bottle x 2   Entered by:   Josph Macho RMA   Authorized by:   Danise Edge MD   Signed by:   Josph Macho RMA on 02/05/2010   Method used:   Electronically to        [facility] [phone]* (retail)       [address]       [place], [place]  [address]       Ph: [phone] or [phone]       Fax: [phone]   RxID:   [id number]

## 2010-07-18 NOTE — Assessment & Plan Note (Signed)
Summary: SINUS PRESSURE,COUGH/DRB   Allergies: 1)  ! Seldane 2)  Amoxicillin (Amoxicillin)   Complete Medication List: 1)  Diazepam 5 Mg Tabs (Diazepam) .Marland Kitchen.. 1 tablet by mouth three times a day 2)  Finasteride 5 Mg Tabs (Finasteride) .Marland Kitchen.. 1 qd 3)  Imipramine Hcl 25 Mg Tabs (Imipramine hcl) .Marland Kitchen.. 1 tid 4)  Omeprazole 40 Mg Cpdr (Omeprazole) .Marland Kitchen.. 1 two times a day 5)  Hydroxyzine Hcl 25 Mg Tabs (Hydroxyzine hcl) .Marland Kitchen.. 1 by mouth three times a day 6)  Naprosyn 125 Mg/64ml Susp (Naproxen) .... 4 tsp am and pm 7)  Tylenol Codeine Liquid  .Marland Kitchen.. 2-3 tsp q4h as needed pain 8)  Entex Liquid  .... 2 tsp three times a day for congestion 9)  Astepro 0.15 % Soln (Azelastine hcl) .... 2  sprays  morning and  midafternoon 10)  Analpram-hc Singles 1-2.5 % Crea (Hydrocortisone ace-pramoxine) .... Insert 1 applicator two times a day  Patient Instructions: 1)  OTITIS MEDIA, PHARYNGITIS, BRONCHITIS 2)  Levaquin 500mg  x 10 dyas 3)  use tylemol with codein for cough 4)  take entex  for congestion 5)  veranist, 2 sprays each nostril once per day 6)  good fluid intake  Appended Document: SINUS PRESSURE,COUGH/DRB     Vital Signs:  Patient profile:   50 year old male O2 Sat:      98 % Temp:     98.5 degrees F Pulse rate:   84 / minute BP sitting:   120 / 82  Vitals Entered By: Judithann Sheen MD (February 23, 2009 9:36 AM) CC: sinus congetion cough  Is Patient Diabetic? No Pain Assessment Patient in pain? no        History of Present Illness: c/o sinus pressure earaches also wnats to discuss painful ejaulation has seen urologist but unexplained mylagia been resloved since changing crestror cont to have joint pain but relieved with naproxen susp headaches gets sworse whn cough alslmost constant c/o no energy body aches ears stopped up sore throatr occ   Allergies: 1)  ! Seldane 2)  Amoxicillin (Amoxicillin)  Review of Systems      See HPI General:  Complains of fatigue and  malaise. ENT:  Complains of earache, nasal congestion, sinus pressure, and sore throat. CV:  Denies bluish discoloration of lips or nails, chest pain or discomfort, difficulty breathing at night, difficulty breathing while lying down, fainting, fatigue, leg cramps with exertion, lightheadness, near fainting, palpitations, shortness of breath with exertion, swelling of feet, swelling of hands, and weight gain. Resp:  Complains of cough and shortness of breath. GI:  Denies abdominal pain, bloody stools, change in bowel habits, constipation, dark tarry stools, diarrhea, excessive appetite, gas, hemorrhoids, indigestion, loss of appetite, nausea, vomiting, vomiting blood, and yellowish skin color. GU:  painful ejauculation. MS:  Denies joint pain, joint redness, joint swelling, loss of strength, low back pain, mid back pain, muscle aches, muscle , cramps, muscle weakness, stiffness, and thoracic pain.  Physical Exam  General:  Well-developed,well-nourished,in no acute distress; alert,appropriate and cooperative throughout examination Head:  Normocephalic and atraumatic without obvious abnormalities. No apparent alopecia or balding. Eyes:  No corneal or conjunctival inflammation noted. EOMI. Perrla. Funduscopic exam benign, without hemorrhages, exudates or papilledema. Vision grossly normal. Ears:  rt tempanic membrane erythematous Nose:  erythematous mucosa with yellow drainage tender maximaillary sinuses Mouth:  pharyngeal erythema.   Neck:  No deformities, masses, or tenderness noted. Lungs:  Normal respiratory effort, chest expands symmetrically. Lungs are clear to  auscultation, no crackles or wheezes. Heart:  Normal rate and regular rhythm. S1 and S2 normal without gallop, murmur, click, rub or other extra sounds.   Impression & Recommendations:  Problem # 1:  OTITIS MEDIA, ACUTE (ICD-382.9) Assessment Deteriorated  His updated medication list for this problem includes:    Naprosyn 125  Mg/59ml Susp (Naproxen) .Marland KitchenMarland KitchenMarland KitchenMarland Kitchen 4 tsp am and pm    Levaquin 500 Mg Tabs (Levofloxacin) .Marland Kitchen... 1 qd  Orders: Prescription Created Electronically [phone])  Problem # 2:  RHINITIS, BACTERIAL (ICD-460) Assessment: Deteriorated  His updated medication list for this problem includes:    Naprosyn 125 Mg/7ml Susp (Naproxen) .Marland KitchenMarland KitchenMarland KitchenMarland Kitchen 4 tsp am and pm  Problem # 3:  ACUTE PHARYNGITIS (ICD-462) Assessment: Deteriorated  His updated medication list for this problem includes:    Naprosyn 125 Mg/13ml Susp (Naproxen) .Marland KitchenMarland KitchenMarland KitchenMarland Kitchen 4 tsp am and pm    Levaquin 500 Mg Tabs (Levofloxacin) .Marland Kitchen... 1 qd  Problem # 4:  ARTHRITIS (ICD-716.90) Assessment: Unchanged  Problem # 5:  GERD (ICD-530.81) Assessment: Unchanged  His updated medication list for this problem includes:    Omeprazole 40 Mg Cpdr (Omeprazole) .Marland Kitchen... 1 two times a day  Complete Medication List: 1)  Diazepam 5 Mg Tabs (Diazepam) .Marland Kitchen.. 1 tablet by mouth three times a day 2)  Finasteride 5 Mg Tabs (Finasteride) .Marland Kitchen.. 1 qd 3)  Imipramine Hcl 25 Mg Tabs (Imipramine hcl) .Marland Kitchen.. 1 tid 4)  Omeprazole 40 Mg Cpdr (Omeprazole) .Marland Kitchen.. 1 two times a day 5)  Hydroxyzine Hcl 25 Mg Tabs (Hydroxyzine hcl) .Marland Kitchen.. 1 by mouth three times a day 6)  Naprosyn 125 Mg/74ml Susp (Naproxen) .... 4 tsp am and pm 7)  Tylenol Codeine Liquid  .Marland Kitchen.. 2-3 tsp q4h as needed pain 8)  Entex Liquid  .... 2 tsp three times a day for congestion 9)  Astepro 0.15 % Soln (Azelastine hcl) .... 2  sprays  morning and  midafternoon 10)  Analpram-hc Singles 1-2.5 % Crea (Hydrocortisone ace-pramoxine) .... Insert 1 applicator two times a day 11)  Levaquin 500 Mg Tabs (Levofloxacin) .Marland Kitchen.. 1 qd

## 2010-07-18 NOTE — Assessment & Plan Note (Signed)
Summary: back pain/mhf  Medications Added * TLY7LENOLWITH CODEINE 132.5 MG CC 2 tsp q4h  as needed pain2        Vital Signs:  Patient Profile:   50 Years Old Male Weight:      189 pounds Pulse rate:   100 / minute BP sitting:   110 / 80  (left arm) Cuff size:   regular  Vitals Entered By: Pura Spice, RN (July 20, 2007 3:25 PM)                 Chief Complaint:  hurt back yest don't know if hemorrhoids and muscle spasms or prostate.  History of Present Illness: Had onset back pain,low back, after having BM,pain radiated into legs pt went to chiropractor for adjustment which did not help took naproxen and tylenol#3 with minimal help pt concerned about possible prostate problem, previous hx of prostatitis also complains of nasal congestion and tinnitis had 3 epidural injections neck with poor results no other systems envolved  Current Allergies: AMOXICILLIN (AMOXICILLIN)     Review of Systems      See HPI  General      Denies chills, fatigue, fever, loss of appetite, malaise, sleep disorder, sweats, weakness, and weight loss.  ENT      See HPI  CV      Denies bluish discoloration of lips or nails, chest pain or discomfort, difficulty breathing at night, difficulty breathing while lying down, fainting, fatigue, leg cramps with exertion, lightheadness, near fainting, palpitations, shortness of breath with exertion, swelling of feet, swelling of hands, and weight gain.  MS      See HPI   Physical Exam  General:     Well-developed,well-nourished,in no acute distress; alert,appropriate and cooperative throughout examination having problem walking and bending Eyes:     No corneal or conjunctival inflammation noted. EOMI. Perrla. Funduscopic exam benign, without hemorrhages, exudates or papilledema. Vision grossly normal. Ears:     blue tubes present, TM neg Nose:     nasal congestion, moderate Mouth:     Oral mucosa and oropharynx without lesions or  exudates.  Teeth in good repair. Lungs:     Normal respiratory effort, chest expands symmetrically. Lungs are clear to auscultation, no crackles or wheezes. Heart:     Normal rate and regular rhythm. S1 and S2 normal without gallop, murmur, click, rub or other extra sounds. Rectal:     No external abnormalities noted. Normal sphincter tone. No rectal masses or tenderness. Prostate:     Prostate gland firm and smooth, no enlargement, nodularity, tenderness, mass, asymmetry or induration. Msk:     tenderness rt SI joint no limitation SLR Pulses:     R and L carotid,radial,femoral,dorsalis pedis and posterior tibial pulses are full and equal bilaterally Extremities:     No clubbing, cyanosis, edema, or deformity noted with normal full range of motion of all joints.      Complete Medication List: 1)  Aciphex 20 Mg Tbec (Rabeprazole sodium) .Marland Kitchen.. 1 tablet by mouth once a day 2)  Cymbalta 60 Mg Cpep (Duloxetine hcl) .... Take 2 capsule by mouth twice a day 3)  Diazepam 5 Mg Tabs (Diazepam) .Marland Kitchen.. 1 tablet by mouth three times a day 4)  Fexofenadine Hcl 180 Mg Tabs (Fexofenadine hcl) .... Take 1 tablet by mouth once a day 5)  Hydroxyzine Pamoate 25 Mg Caps (Hydroxyzine pamoate) .... Take 1-2 capsule by mouth at bedtime 6)  Omnaris 50 Mcg/act Susp (Ciclesonide) .Marland KitchenMarland KitchenMarland Kitchen 1  spray bid 7)  Finasteride 5 Mg Tabs (Finasteride) .Marland Kitchen.. 1 qd 8)  Imipramine Hcl 25 Mg Tabs (Imipramine hcl) .Marland Kitchen.. 1 tid 9)  Tylenol 325 Mg Tabs (Acetaminophen) .... Prn 10)  Naproxen 500 Mg Tabs (Naproxen) .... Prn 11)  Entex 22.5 Mg/69ml Susp (Pseudoephedrine tannate) .... Prn 12)  Flonase 50 Mcg/act Susp (Fluticasone propionate) .... 2 sprays once daily prn 13)  Tly7lenolwith Codeine 132.5 Mg Cc  .... 2 tsp q4h  as needed pain2   Patient Instructions: 1)  liquis naproxen 2)  bedrest 3)  tylenol codeine #3 2 tsp every 4 hrs as needed 4)  cold and hot packs as directed 5)  call if no better    Prescriptions: TLY7LENOLWITH  CODEINE 132.5 MG CC 2 tsp q4h  as needed pain2  #240 cc x 3   Entered and Authorized by:   Judithann Sheen MD   Signed by:   Judithann Sheen MD on 07/22/2007   Method used:   Print then Give to Patient   RxID:   [id number]  ]

## 2010-07-18 NOTE — Progress Notes (Signed)
Summary: RF  Medications Added DIAZEPAM 5 MG TABS (DIAZEPAM) 1 tablet by mouth three times a day       Phone Note Call from Patient Call back at Home Phone [phone]   Caller: Patient-live call Call For:  DR STAFFORD Reason for Call: Refill Medication Summary of Call: rf diazepam at [facility] [address] Initial call taken by: Warnell Forester,  May 18, 2007 3:34 PM  Follow-up for Phone Call        Per Dr. Scotty Court ok this time plus 5 refills. Sent back via fax. Follow-up by: Romualdo Bolk, CMA,  May 19, 2007 3:04 PM    New/Updated Medications: DIAZEPAM 5 MG TABS (DIAZEPAM) 1 tablet by mouth three times a day   Prescriptions: DIAZEPAM 5 MG TABS (DIAZEPAM) 1 tablet by mouth three times a day  #90 x 5   Entered by:   Romualdo Bolk, CMA   Authorized by:   Judithann Sheen MD   Signed by:   Romualdo Bolk, CMA on 05/19/2007   Method used:   [facility]phoned to ...       [facility] [id number]       [address]       [place], [place]  [address]       Ph: [phone] or [phone]       Fax: [phone]   RxID:   [id number]

## 2010-07-18 NOTE — Progress Notes (Signed)
Summary: NITROSTAT 0.4MG    Phone Note Call from Patient Call back at Home Phone [phone]   Caller: Patient Call For: BLYTH,STACY Summary of Call: PT STATED NITROSTAT 0.4MG  W[facility] S[facility] TO [facility] Initial call taken by: Heron Sabins,  February 04, 2010 4:54 PM  Follow-up for Phone Call        MD sent at 5:00 last night Follow-up by: Josph Macho RMA,  February 05, 2010 7:58 AM

## 2010-07-18 NOTE — Assessment & Plan Note (Signed)
Summary: gi issues//ccm   Vital Signs:  Patient profile:   50 year old male Height:      69 inches (175.26 cm) Weight:      192 pounds (87.27 kg) BMI:     28.46 O2 Sat:      98 % on Room air Temp:     98.6 degrees F (37.00 degrees C) oral Pulse rate:   107 / minute BP sitting:   147 / 96  (left arm) Cuff size:   regular  Vitals Entered By: Josph Macho RMA (February 04, 2010 3:09 PM)  O2 Flow:  Room air  Serial Vital Signs/Assessments:  Time      Position  BP       Pulse  Resp  Temp     By                     148/96                         Danise Edge MD  CC: GI Issues-headache, dizziness x5 days/bleeding in anal area/ CF Is Patient Diabetic? No   History of Present Illness: Patient in with a long list of concerns. He started having chest pain last Thursday. He is unable to recall when it started or what he was doing but it is constant. He denies any palpitations/diaphoresis or SOB associated. His other symptoms are all GI related He is reporting intermittent episodes of BRBPR and some infrequent episodes of Black stool as well. He notes the blood is sometimes on the tissue, sometimes on the stool and sometimes in the bowl. He reports he has a bowel movement on most days without any associated pain. He does have a distant history of PUD but he denies any recent burping or bloating. Does have some discomfort in the epigastric and LUQ regions at times. He also c/o some occasional hot flashes not occuring with any other symptoms in an identifiable pattern.  Current Medications (verified): 1)  Diazepam 5 Mg Tabs (Diazepam) .Marland Kitchen.. 1 Tablet By Mouth Three Times A Day 2)  Omeprazole 40 Mg  Cpdr (Omeprazole) .Marland Kitchen.. 1 Two Times A Day For Gerd 3)  Hydroxyzine Hcl 25 Mg  Tabs (Hydroxyzine Hcl) .Marland Kitchen.. 1 By Mouth Three Times A Day 4)  Naprosyn 125 Mg/44ml  Susp (Naproxen) .... 4 Tsp Am and Pm 5)  Tylenol Codeine Liquid .Marland Kitchen.. 2-3 Tsp Q4h As Needed Pain 6)  Entex Liquid .... 2 Tsp Three Times A Day  For Congestion 7)  Analpram-Hc Singles 1-2.5 % Crea (Hydrocortisone Ace-Pramoxine) .... Insert 1 Applicator Two Times A Day 8)  Cortisporin 3.5-10000-1 Soln (Neomycin-Polymyxin-Hc) .... 4 Qtts in Each Ear Tid 9)  Nortriptyline Hcl 10 Mg/13ml Soln (Nortriptyline Hcl) .... 3 Tsp Hs 10)  Nasonex 50 Mcg/act Susp (Mometasone Furoate) .... Use As Directed  Allergies (verified): 1)  ! Seldane 2)  Amoxicillin (Amoxicillin)  Past History:  Past medical history reviewed for relevance to current acute and chronic problems. Social history (including risk factors) reviewed for relevance to current acute and chronic problems.  Past Medical History: Reviewed history from 05/02/2009 and no changes required. Allergic rhinitis Headache PITUITARY ADENOMA nEPROLETHIASIS  Social History: Reviewed history and no changes required.  Review of Systems      See HPI  Physical Exam  General:  Well-developed,well-nourished,in no acute distress; alert,appropriate and cooperative throughout examination Head:  Normocephalic and atraumatic without obvious abnormalities. Mouth:  Oral mucosa and  oropharynx without lesions or exudates. Oropharynx erythematous Neck:  No deformities, masses, or tenderness noted. Lungs:  Normal respiratory effort, chest expands symmetrically. Lungs are clear to auscultation, no crackles or wheezes. Heart:  Normal rate and regular rhythm. S1 and S2 normal without gallop, murmur, click, rub or other extra sounds. Abdomen:  Bowel sounds positive,abdomen soft and non-tender without masses, organomegaly or hernias noted. Extremities:  No clubbing, cyanosis, edema, or deformity noted  Psych:  Oriented X3, memory intact for recent and remote, dysphoric affect, poor eye contact, and slightly anxious.     Impression & Recommendations:  Problem # 1:  EPIGASTRIC PAIN (ICD-789.06)  Orders: TLB-CBC Platelet - w/Differential (85025-CBCD) TLB-BMP (Basic Metabolic Panel-BMET)  (80048-METABOL) TLB-Hepatic/Liver Function Pnl (80076-HEPATIC) TLB-TSH (Thyroid Stimulating Hormone) (84443-TSH) TLB-H. Pylori Abs(Helicobacter Pylori) (86677-HELICO) Venipuncture ([address]) Specimen Handling (60[address]) Ranitidine is added to his Omeprazole and use both two times a day avoid offending foods.  Problem # 2:  OTHER DYSPHAGIA (ICD-787.29)  Orders: TLB-CBC Platelet - w/Differential (85025-CBCD) TLB-BMP (Basic Metabolic Panel-BMET) (80048-METABOL) TLB-Hepatic/Liver Function Pnl (80076-HEPATIC) TLB-TSH (Thyroid Stimulating Hormone) (84443-TSH) TLB-H. Pylori Abs(Helicobacter Pylori) (86677-HELICO) Venipuncture (09811) Specimen Handling (91478) Gastroenterology Referral (GI) rxed liquid meds  Problem # 3:  CHEST PAIN, ATYPICAL (ICD-786.59)  Orders: TLB-CBC Platelet - w/Differential (85025-CBCD) TLB-BMP (Basic Metabolic Panel-BMET) (80048-METABOL) TLB-Hepatic/Liver Function Pnl (80076-HEPATIC) TLB-TSH (Thyroid Stimulating Hormone) (84443-TSH) TLB-H. Pylori Abs(Helicobacter Pylori) (86677-HELICO) Venipuncture (29562) Specimen Handling (13086) Gastroenterology Referral (GI) Cardiolite (Cardiolite) pain likely GI related but need to r/o cardiac cause  Problem # 4:  ELEVATED BP READING WITHOUT DX HYPERTENSION (ICD-796.2) Continue to monitor set up for annual exam in 3 months and may come back as needed if any concerns  Complete Medication List: 1)  Diazepam 5 Mg Tabs (Diazepam) .Marland Kitchen.. 1 tablet by mouth three times a day 2)  Omeprazole 40 Mg Cpdr (Omeprazole) .Marland Kitchen.. 1 two times a day for gerd 3)  Hydroxyzine Hcl 25 Mg Tabs (Hydroxyzine hcl) .Marland Kitchen.. 1 by mouth three times a day 4)  Naprosyn 125 Mg/28ml Susp (Naproxen) .... 4 tsp am and pm 5)  Tylenol Codeine Liquid  .Marland Kitchen.. 2-3 tsp q4h as needed pain 6)  Entex Liquid  .... 2 tsp three times a day for congestion 7)  Analpram-hc Singles 1-2.5 % Crea (Hydrocortisone ace-pramoxine) .... Insert 1 applicator two times a day 8)  Cortisporin  3.5-10000-1 Soln (Neomycin-polymyxin-hc) .... 4 qtts in each ear tid 9)  Nortriptyline Hcl 10 Mg/23ml Soln (Nortriptyline hcl) .... 3 tsp hs 10)  Nasonex 50 Mcg/act Susp (Mometasone furoate) .... Use as directed 11)  Flunisolide 29 Mcg/act Soln (Flunisolide) .... 2 sprays each nostril two times a day 12)  Cortisporin 3.5-10000-1 Soln (Neomycin-polymyxin-hc) .... 4 drops b/l ears three times a day x 10 days 13)  Ranitidine Hcl 75 Mg/7ml Syrp (Ranitidine hcl) .... 2 tsp by mouth two times a day 14)  Nitrostat 0.4 Mg Subl (Nitroglycerin) .Marland Kitchen.. 1 tab by mouh sl as needed cp repeat q x 3 doses if pain is persistent. if no resolution after 3 doses to er  Patient Instructions: 1)  Please schedule a follow-up appointment in 3 months for annual exam 2)  Avoid foods high in acid(tomatoes, citrus juices,spicy foods).Avoid eating within two hours of lying down or before exercising. Do not over eat: try smaller more frequent meals. Elevate head of bed twelve inches when sleeping.  3)  Avoid fatty/spicy food, eat small frequent meals, start a probiotic such as Align and a fiber supplement dailSeek immediate medical care  if Chest pain worsens and does not respond t NTG 4)  Take an 81mg  Aspirin daily Prescriptions: FLUNISOLIDE 29 MCG/ACT SOLN (FLUNISOLIDE) 2 sprays each nostril two times a day  #1 bottle x 2   Entered and Authorized by:   Danise Edge MD   Signed by:   Danise Edge MD on 02/04/2010   Method used:   Electronically to        [facility] [address] [phone]* (retail)       [address]       [place], [place]  [address]       Ph: [phone] or [phone]       Fax: [phone]   RxID:   [id number] NITROSTAT 0.4 MG SUBL (NITROGLYCERIN) 1 tab by mouh sl as needed cp repeat q x 3 doses if pain is persistent. If no resolution after 3 doses to ER  #25 x 1   Entered and Authorized by:   Danise Edge MD   Signed by:   Danise Edge MD on 02/04/2010   Method used:   Electronically to         [facility] [address] [phone]* (retail)       [address]       [place], [place]  [address]       Ph: [phone] or [phone]       Fax: [phone]   RxID:   [id number] RANITIDINE HCL 75 MG/5ML SYRP (RANITIDINE HCL) 2 tsp by mouth two times a day  #449ml x 1   Entered and Authorized by:   Danise Edge MD   Signed by:   Danise Edge MD on 02/04/2010   Method used:   [facility]ronically to        [facility] [address] [phone]* (retail)       [address]       [place], [place]  [address]       Ph: [phone] or [phone]       Fax: [phone]   RxID:   [id number] RANITIDINE HCL 75 MG/5ML SYRP (RANITIDINE HCL) 2 tsp by mouth two times a day  #1 bottle x 1   Entered and Authorized by:   Danise Edge MD   Signed by:   Danise Edge MD on 02/04/2010   Method used:   [facility]ronically to        [facility] [address] [phone]* (retail)       [address]       [place], [place]  [address]       Ph: [phone] or [phone]       Fax: [phone]   RxID:   [id number] CORTISPORIN 3.5-10000-1 SOLN [facility]) 4 drops b/l ears three times a day x 10 days  #1 x 1   Entered and Authorized by:   Danise Edge MD   Signed by:   Danise Edge MD on 02/04/2010   Method used:   [facility]ronically to        [facility] [address] [phone]* (retail)       [address]       [place], [place]  [address]       Ph: [phone] or [phone]       Fax: [phone]   RxID:   [id number] FLUNISOLIDE 29 MCG/ACT SOLN (FLUNISOLIDE) 2 sprays each nostril two times a day  #1 x 2   Entered and Authorized by:   Danise Edge MD  Signed by:   Danise Edge MD on 02/04/2010   Method used:   [facility]ronically [facility]        [facility] [address] [phone]* (retail)       [address]       [place], [place]  [address]       Ph: [phone] or [phone]       Fax: [phone]   RxID:   [id number]

## 2010-07-18 NOTE — Telephone Encounter (Signed)
Pt adv that he has been trying to get into office to see Dr Russella Dar for over 2 mths.... Stated that there was a miscommunication between [facility] and [facility] and because of this his appt was cancelled??.....may need a referral to another GI physician..?  Pt can be reached at  [phone].

## 2010-07-18 NOTE — Assessment & Plan Note (Signed)
Summary: rocephrine inj/njr   Nurse Visit    Prior Medications: DIAZEPAM 5 MG TABS (DIAZEPAM) 1 tablet by mouth three times a day FINASTERIDE 5 MG  TABS (FINASTERIDE) 1 qd IMIPRAMINE HCL 25 MG  TABS (IMIPRAMINE HCL) 1 tid TYLENOL 325 MG  TABS (ACETAMINOPHEN) prn OMEPRAZOLE 40 MG  CPDR (OMEPRAZOLE) 1 two times a day HYDROXYZINE HCL 25 MG  TABS (HYDROXYZINE HCL) 1 by mouth three times a day NAPROSYN 125 MG/5ML  SUSP (NAPROXEN) 4 tsp AM and PM ANALPRAM-HC SINGLES 1-2.5 % CREA (HYDROCORTISONE ACE-PRAMOXINE) Insert i applicator two times a day for proctitis TYLENOL CODEINE LIQUID () 2-3 tsp q4h as needed pain ENTEX LIQUID () 2 tsp three times a day for congestion ASTEPRO 0.15 % SOLN (AZELASTINE HCL) 2  sprays  morning and  midafternoon AZITHROMYCIN SUSP () take 500mg  x 1 day then 250mg  x 4 days Current Allergies: ! SELDANE AMOXICILLIN (AMOXICILLIN)    Medication Administration  Injection # 1:    Medication: Rocephin  250mg     Diagnosis: ACUTE BRONCHITIS (ICD-466.0)    Route: IM    Site: RUOQ gluteus    Exp Date: 05/2011    Lot #: [id number]    Mfr: novaplus    Patient tolerated injection without complications    Given by: Pura Spice, RN (August 23, 2008 5:31 PM)  Orders Added: 1)  Rocephin  250mg  [J0696] 2)  Admin of Therapeutic Inj  intramuscular or subcutaneous Lepidus.[place]    ]

## 2010-07-18 NOTE — Letter (Signed)
Summary: [facility]  [facility]   Imported By: Maryln Gottron 12/05/2008 14:50:06  _____________________________________________________________________  External Attachment:    Type:   Image     Comment:   External Document

## 2010-07-18 NOTE — Procedures (Signed)
Summary: Colonoscopy, EGD Report/[facility]  Colonoscopy, EGD Report/[facility]   Imported By: Maryln Gottron 03/20/2010 15:31:04  _____________________________________________________________________  External Attachment:    Type:   Image     Comment:   External Document

## 2010-07-18 NOTE — Letter (Signed)
Summary: New Patient letter  [facility]  [address] [place], [place] [address]   Phone: [phone]  Fax: [phone]       02/05/2010 MRN: [id number]  Chase [name] [address] [place], [place]  [address]  Dear Chase [name],  Welcome to the Gastroenterology Division at [facility].    You are scheduled to see Dr.  Russella Dar on 02-07-10 at 9:30am on the 3rd floor at [facility], [address]  We ask that you try to arrive at our office 15 minutes prior to your appointment time to allow for check-in.  We would like you to complete the enclosed self-administered evaluation form prior to your visit and bring it with you on the day of your appointment.  We will review it with you.  Also, please bring a complete list of all your medications or, if you prefer, bring the medication bottles and we will list them.  Please bring your insurance card so that we may make a copy of it.  If your insurance requires a referral to see a specialist, please bring your referral form from your primary care physician.  Co-payments are due at the time of your visit and may be paid by cash, check or credit card.     Your office visit will consist of a consult with your physician (includes a physical exam), any laboratory testing he/she may order, scheduling of any necessary diagnostic testing (e.g. x-ray, ultrasound, CT-scan), and scheduling of a procedure (e.g. Endoscopy, Colonoscopy) if required.  Please allow enough time on your schedule to allow for any/all of these possibilities.    If you cannot keep your appointment, please call [phone] to cancel or reschedule prior to your appointment date.  This allows [place] the opportunity to schedule an appointment for another patient in need of care.  If you do not cancel or reschedule by 5 p.m. the business day prior to your appointment date, you will be charged a $50.00 late cancellation/no-show fee.    Thank you for choosing [facility] for your medical needs.  We appreciate the opportunity to care for you.  Please visit [place] at our website  to learn more about our practice.                     Sincerely,                                                             The Gastroenterology Division

## 2010-07-18 NOTE — Assessment & Plan Note (Signed)
Summary: continued rectal bleeding???/ear ache/dm   Vital Signs:  Patient profile:   50 year old male Weight:      194 pounds O2 Sat:      96 % Temp:     98.3 degrees F Pulse rate:   114 / minute Pulse rhythm:   regular BP sitting:   132 / 90  Vitals Entered By: Pura Spice, RN (November 22, 2009 10:09 AM) CC: 1. depression. 2. burning urination.  3. fatigue afater eating  4. bld with stools 5. leg pains dull aching. 6. memory problems. 8. aching in ears and chest pain    History of Present Illness: This 50 year old white male complains of ringing in the ears which she has had for some time and has had decreased hearing and is being seen by otolaryngologist Dr. Gerilyn Pilgrim He also complained of bleeding from his anus he has a bowel movement with some irritation He is concerned about having tapeworms in his stool and is informed to bring in a stool specimen He complains of burning on urination and increased urinary frequency pain over the suprapubic area As dull ache in his back and pain in his leg Complained of increase from when in his abdomen as well as increase gas or flatus Continues to have some costochondral pain which is as before patient also complains of being very depressed  Allergies: 1)  ! Seldane 2)  Amoxicillin (Amoxicillin)  Past History:  Past Medical History: Last updated: 05/02/2009 Allergic rhinitis Headache PITUITARY ADENOMA nEPROLETHIASIS  Past Surgical History: Last updated: 12/23/2006 Cataract extraction Kidney Stone Back and neck surgery  Risk Factors: Smoking Status: quit (02/19/2007)  Review of Systems      See HPI  The patient denies anorexia, fever, weight loss, weight gain, vision loss, decreased hearing, hoarseness, chest pain, syncope, dyspnea on exertion, peripheral edema, prolonged cough, headaches, hemoptysis, abdominal pain, melena, hematochezia, severe indigestion/heartburn, hematuria, incontinence, genital sores, muscle weakness,  suspicious skin lesions, transient blindness, difficulty walking, depression, unusual weight change, abnormal bleeding, enlarged lymph nodes, angioedema, breast masses, and testicular masses.    Physical Exam  General:  Well-developed,well-nourished,in no acute distress; alert,appropriate and cooperative throughout examination Head:  Normocephalic and atraumatic without obvious abnormalities. No apparent alopecia or balding. Eyes:  No corneal or conjunctival inflammation noted. EOMI. Perrla. Funduscopic exam benign, without hemorrhages, exudates or papilledema. Vision grossly normal. Ears:  External ear exam shows no significant lesions or deformities.  Otoscopic examination reveals clear canals, tympanic membranes are intact bilaterally without bulging, retraction, inflammation or discharge. Hearing is grossly normal bilaterally. Nose:  nasal congestion boggy mucosa with clear drainage Mouth:  Oral mucosa and oropharynx without lesions or exudates.  Teeth in good repair. Neck:  No deformities, masses, or tenderness noted. Chest Wall:  costochondral tenderness of the left chest Lungs:  Normal respiratory effort, chest expands symmetrically. Lungs are clear to auscultation, no crackles or wheezes. Heart:  Normal rate and regular rhythm. S1 and S2 normal without gallop, murmur, click, rub or other extra sounds. Abdomen:  increased bowel sounds tenderness over 4 abdomen Rectal:  anal mucosa erythematous with eructation compatible with proctitis Genitalia:  Testes bilaterally descended without nodularity, tenderness or masses. No scrotal masses or lesions. No penis lesions or urethral discharge. Prostate:  prostate enlarged boggy very tender no nodules no asymmetry Msk:  No deformity or scoliosis noted of thoracic or lumbar spine.   Pulses:  R and L carotid,radial,femoral,dorsalis pedis and posterior tibial pulses are full and equal bilaterally  Extremities:  No clubbing, cyanosis, edema, or deformity  noted with normal full range of motion of all joints.   Neurologic:  No cranial nerve deficits noted. Station and gait are normal. Plantar reflexes are down-going bilaterally. DTRs are symmetrical throughout. Sensory, motor and coordinative functions appear intact. Skin:  Intact without suspicious lesions or rashes Cervical Nodes:  No lymphadenopathy noted Axillary Nodes:  No palpable lymphadenopathy Inguinal Nodes:  No significant adenopathy Psych:  Cognition and judgment appear intact. Alert and cooperative with normal attention span and concentration. No apparent delusions, illusions, hallucinations   Impression & Recommendations:  Problem # 1:  ACUTE PROSTATITIS (ICD-601.0) Assessment New ciprofloxacin 500 mg b.i.d.  Problem # 2:  FREQUENCY, URINARY (ICD-788.41) Assessment: New  Orders: UA Dipstick w/o Micro (automated)  (81003)  Problem # 3:  RECTAL BLEEDING (ICD-569.3) Assessment: Deteriorated proctitis  Problem # 4:  ANXIETY DEPRESSION (ICD-300.4) Assessment: Deteriorated amitriptyline 75 mg h.s.  Problem # 5:  PROCTITIS (ZOX-096.04) Assessment: Deteriorated Analpram-HC b.i.d.  Problem # 6:  HEADACHE (ICD-784.0) Assessment: Unchanged  His updated medication list for this problem includes:    Naprosyn 125 Mg/68ml Susp (Naproxen) .Marland KitchenMarland KitchenMarland KitchenMarland Kitchen 4 tsp am and pm  Problem # 7:  ALLERGIC RHINITIS (ICD-477.9) Assessment: Deteriorated  The following medications were removed from the medication list:    Omnaris 50 Mcg/act Susp (Ciclesonide) .Marland Kitchen... 2 sprays each nostril each day His updated medication list for this problem includes:    Nasonex 50 Mcg/act Susp (Mometasone furoate) ..... Use as directed  Problem # 8:  HEADACHE (ICD-784.0)  Complete Medication List: 1)  Diazepam 5 Mg Tabs (Diazepam) .Marland Kitchen.. 1 tablet by mouth three times a day 2)  Imipramine Hcl 25 Mg Tabs (Imipramine hcl) .Marland Kitchen.. 1 tid 3)  Omeprazole 40 Mg Cpdr (Omeprazole) .Marland Kitchen.. 1 two times a day for gerd 4)   Hydroxyzine Hcl 25 Mg Tabs (Hydroxyzine hcl) .Marland Kitchen.. 1 by mouth three times a day 5)  Naprosyn 125 Mg/59ml Susp (Naproxen) .... 4 tsp am and pm 6)  Tylenol Codeine Liquid  .Marland Kitchen.. 2-3 tsp q4h as needed pain 7)  Entex Liquid  .... 2 tsp three times a day for congestion 8)  Analpram-hc Singles 1-2.5 % Crea (Hydrocortisone ace-pramoxine) .... Insert 1 applicator two times a day 9)  Cortisporin 3.5-10000-1 Soln (Neomycin-polymyxin-hc) .... 4 qtts in each ear tid 10)  Nortriptyline Hcl 10 Mg/16ml Soln (Nortriptyline hcl) .... 3 tsp hs 11)  Nasonex 50 Mcg/act Susp (Mometasone furoate) .... Use as directed 12)  Ciprofloxacin Hcl 500 Mg Tabs (Ciprofloxacin hcl) .Marland Kitchen.. 1 by mouth two times a day  crush and mix in pudding  Patient Instructions: 1)  Prostatitis, ciprofloxin 2)  depression ,, Norflex 30 cc mhs 3)  analpram HVC for prodtatiits 4)  omnaris for nasal congestion 5)  bring in specimen 6)  try chlortrimeton for allergy, liquid 7)  alighn for rumbling and gas Prescriptions: NORTRIPTYLINE HCL 10 MG/5ML SOLN (NORTRIPTYLINE HCL) 3 tsp hs  #900 x 5   Entered by:   Pura Spice, RN   Authorized by:   Judithann Sheen MD   Signed by:   Pura Spice, RN on 11/22/2009   Method used:   [facility] to        [facility] [phone]* (retail)       [address]       [place], [place]  [address]       Ph: [phone] or [phone]  Fax: [phone]   RxID:   [id number] CIPROFLOXACIN HCL 500 MG TABS (CIPROFLOXACIN HCL) 1 by mouth two times a day  Crush and mix in pudding  #30 x 0   Entered by:   Pura Spice, RN   Authorized by:   Judithann Sheen MD   Signed by:   Pura Spice, RN on 11/22/2009   Method used:   [facility]ronically to        [facility] [phone]* (retail)       [address]       [place], [place]  [address]       Ph: [phone] or [phone]       Fax: [phone]   RxID:   [id number] OMNARIS 50 MCG/ACT SUSP (CICLESONIDE) 2 sprays  each nostril each day  #1 x 11   Entered and Authorized by:   Judithann Sheen MD   Signed by:   Judithann Sheen MD on 11/22/2009   Method used:   [facility]ically to        [facility] [phone]* (retail)       [address]       [place], [place]  [address]       Ph: [phone] or [phone]       Fax: [phone]   RxID:   [id number] NORTRIPTYLINE HCL 10 MG/5ML SOLN (NORTRIPTYLINE HCL) 3 tsp has  #900cc x 5   Entered and Authorized by:   Judithann Sheen MD   Signed by:   Judithann Sheen MD on 11/22/2009   Method used:   [facility]ronically to        [facility] [phone]* (retail)       [address]       [place], [place]  [address]       Ph: [phone] or [phone]       Fax: [phone]   RxID:   [id number] [facility] [employer] SINGLES 1-2.5 % CREA (HYDROCORTISONE ACE-PRAMOXINE) insert 1 applicator two times a day  #1 pkge x 2   Entered and Authorized by:   Judithann Sheen MD   Signed by:   Judithann Sheen MD on 11/22/2009   Method used:   [facility]ically to        [facility] [phone]* (retail)       [address]       [place], [place]  [address]       Ph: [phone] or [phone]       Fax: [phone]   RxID:   [id number] CIPROFLOXACIN 400 MG SOLN (CIPROFLOXACIN) 1 tsp three times a day  for infection prostate  #200cc x 1   Entered and Authorized by:   Judithann Sheen MD   Signed by:   Judithann Sheen MD on 11/22/2009   Method used:   [facility]        [facility] [phone]* (retail)       [address]       [place], [place]  [address]       Ph: [phone] or [phone]       Fax: [phone]   RxID:   [id number] DIAZEPAM 5 MG TABS (DIAZEPAM) 1 tablet by mouth three times a day  #90 x 5   Entered and Authorized by:   Judithann Sheen MD   Signed  by:   Judithann Sheen MD on 11/22/2009   Method used:   Print then Give to Patient   RxID:    [id number]   Laboratory Results   Urine Tests    Routine Urinalysis   Color: yellow Appearance: Clear Glucose: negative   (Normal Range: Negative) Bilirubin: negative   (Normal Range: Negative) Ketone: negative   (Normal Range: Negative) Spec. Gravity: 1.025   (Normal Range: 1.003-1.035) Blood: negative   (Normal Range: Negative) pH: 6.5   (Normal Range: 5.0-8.0) Protein: negative   (Normal Range: Negative) Urobilinogen: 0.2   (Normal Range: 0-1) Nitrite: negative   (Normal Range: Negative) Leukocyte Esterace: negative   (Normal Range: Negative)    Comments: Rita Ohara  November 22, 2009 10:26 AM

## 2010-07-18 NOTE — Assessment & Plan Note (Signed)
Summary: ulcer?/mhf   Vital Signs:  Patient Profile:   50 Years Old Male Weight:      196 pounds Temp:     98.1 degrees F Pulse rate:   92 / minute BP sitting:   130 / 82  (left arm) Cuff size:   regular  Vitals Entered By: Pura Spice, RN (January 11, 2008 3:28 PM)                 Chief Complaint:  "see my list" "basicallly I am falling apart".  History of Present Illness: Pt complains of epigatric pain after eating spicey food or when stressed Pain on urinating, and ejaculation, and pain low back Pains hips, knees and into thighs and calves more sever pain rt  kneeand does swell at times, pain medial aspect Just finish a course of clindamycin for dental infection Pt has problem swallowing pils and capsules    Current Allergies (reviewed today): ! SELDANE AMOXICILLIN (AMOXICILLIN)     Review of Systems      See HPI   Physical Exam  General:     Well-developed,well-nourished,in no acute distress; alert,appropriate and cooperative throughout examinationoverweight-appearing.   Lungs:     Normal respiratory effort, chest expands symmetrically. Lungs are clear to auscultation, no crackles or wheezes. Heart:     Normal rate and regular rhythm. S1 and S2 normal without gallop, murmur, click, rub or other extra sounds. Abdomen:     epigastic tenderness Rectal:     No external abnormalities noted. Normal sphincter tone. No rectal masses or tenderness. Prostate:     left lobe enlarged and tender, rt lobe minimally enlarged and tenderno nodules.   Msk:     tender over both hip joints rt knee swollwn and medial and lateral tenderness Pulses:     R and L carotid,radial,femoral,dorsalis pedis and posterior tibial pulses are full and equal bilaterally Extremities:     No clubbing, cyanosis, edema, or deformity noted with normal full range of motion of all joints.      Impression & Recommendations:  Problem # 1:  ARTHRITIS (ICD-716.90) Assessment: Deteriorated   Orders: Depo- Medrol 80mg  (J1040) Depo- Medrol 40mg  (J1030) Admin of Therapeutic Inj  intramuscular or subcutaneous (16109) Admin of Therapeutic Inj  intramuscular or subcutaneous (60454) Admin of Therapeutic Inj  intramuscular or subcutaneous (09811)   Problem # 2:  PROSTATITIS, ACUTE (ICD-601.0) Assessment: New  Problem # 3:  GERD (ICD-530.81) Assessment: Deteriorated  The following medications were removed from the medication list:    Aciphex 20 Mg Tbec (Rabeprazole sodium) .Marland Kitchen... 1 tablet by mouth once a day    Omeprazole 40 Mg Cpdr (Omeprazole) .Marland Kitchen... 1 qd  His updated medication list for this problem includes:    Omeprazole 40 Mg Cpdr (Omeprazole) .Marland Kitchen... 1 once daily   Problem # 5:  ANXIETY DEPRESSION (ICD-300.4) Assessment: Unchanged  Complete Medication List: 1)  Diazepam 5 Mg Tabs (Diazepam) .Marland Kitchen.. 1 tablet by mouth three times a day 2)  Finasteride 5 Mg Tabs (Finasteride) .Marland Kitchen.. 1 qd 3)  Imipramine Hcl 25 Mg Tabs (Imipramine hcl) .Marland Kitchen.. 1 tid 4)  Tylenol 325 Mg Tabs (Acetaminophen) .... Prn 5)  Flonase 50 Mcg/act Susp (Fluticasone propionate) .... 2 sprays once daily prn 6)  Omeprazole 40 Mg Cpdr (Omeprazole) .Marland Kitchen.. 1 once daily 7)  Hydroxyzine Hcl 25 Mg Tabs (Hydroxyzine hcl) .Marland Kitchen.. 1 by mouth three times a day 8)  Naprosyn 125 Mg/52ml Susp (Naproxen) .... 4 tsp am and pm 9)  Cipro  500 Mg/12ml (10%) Susr (Ciprofloxacin) .... 2 tsp bid   Patient Instructions: 1)  Early ulcer , omeprazole two times a day 2)  for 5 days then 1 time per day 3)  Prostatitis , cipro 2 tsp two times a day 4)  Depo medrol injection will help hips and knees 5)  also start naproxen two times a day after meals 6)  take two imipramine at night   Prescriptions: CIPRO 500 MG/5ML (10%)  SUSR (CIPROFLOXACIN) 2 tsp bid  #200 cc x 1   Entered and Authorized by:   Judithann Sheen MD   Signed by:   Judithann Sheen MD on 01/11/2008   Method used:   [facility] to ...       [facility] [phone]*       [address]       [place], [place]  [address]       Ph: [phone] or [phone]       Fax: [phone]   RxID:   [id number]  ]  Medication Administration  Injection # 1:    Medication: Depo- Medrol 80mg     Diagnosis: ARTHRITIS (ICD-716.90)    Route: IM    Site: LUOQ gluteus    Exp Date: 08/10    Lot #: [id number]    Mfr: [email or web address]    Patient tolerated injection without complications    Given by: Judithann Sheen MD (January 11, 2008 6:23 PM)  Injection # 2:    Medication: Depo- Medrol 40mg     Diagnosis: ARTHRITIS (ICD-716.90)    Route: IM    Site: LUOQ gluteus    Exp Date: 08/10    Lot #: [id number]    Mfr: sicor    Patient tolerated injection without complications    Given by: Judithann Sheen MD (January 11, 2008 6:24 PM)  Orders Added: 1)  Depo- Medrol 80mg  [J1040] 2)  Depo- Medrol 40mg  [J1030] 3)  Admin of Therapeutic Inj  intramuscular or subcutaneous [96372] 4)  Admin of Therapeutic Inj  intramuscular or subcutaneous [96372] 5)  Admin of Therapeutic Inj  intramuscular or subcutaneous [96372] 6)  Est. Patient Level IV [87564]

## 2010-07-18 NOTE — Assessment & Plan Note (Signed)
Summary: CPX/CJR   Vital Signs:  Patient profile:   50 year old male Height:      69 inches Weight:      192 pounds BMI:     28.46 O2 Sat:      96 % Temp:     97.9 degrees F Pulse rate:   110 / minute BP sitting:   120 / 86  (left arm) Cuff size:   regular  Vitals Entered By: Pura Spice, RN (Nov 01, 2008 3:14 PM) CC: cpx   History of Present Illness: Pt in for physical examination has several complaints, spots on face, occasional rectal bleeding, pain on ejaculation for long period of time and has seen Urologist with  no explanation found Having cough, deep, nonproductive concerned about possible memory changes  Allergies: 1)  ! Seldane 2)  Amoxicillin (Amoxicillin)  Review of Systems      See HPI General:  See HPI; Denies chills, fatigue, fever, loss of appetite, malaise, sleep disorder, sweats, weakness, and weight loss. ENT:  Complains of nasal congestion. CV:  Denies bluish discoloration of lips or nails, chest pain or discomfort, difficulty breathing at night, difficulty breathing while lying down, fainting, fatigue, leg cramps with exertion, lightheadness, near fainting, palpitations, shortness of breath with exertion, swelling of feet, swelling of hands, and weight gain. Resp:  Denies chest discomfort, chest pain with inspiration, cough, coughing up blood, excessive snoring, hypersomnolence, morning headaches, pleuritic, shortness of breath, sputum productive, and wheezing. GI:  Denies abdominal pain, bloody stools, change in bowel habits, constipation, dark tarry stools, diarrhea, excessive appetite, gas, hemorrhoids, indigestion, loss of appetite, nausea, vomiting, vomiting blood, and yellowish skin color. GU:  See HPI; pain on ejaculation. MS:  Denies joint pain, joint redness, joint swelling, loss of strength, low back pain, mid back pain, muscle aches, muscle , cramps, muscle weakness, stiffness, and thoracic pain. Derm:  Denies changes in color of skin, changes  in nail beds, dryness, excessive perspiration, flushing, hair loss, insect bite(s), itching, lesion(s), poor wound healing, and rash. Neuro:  Denies brief paralysis, difficulty with concentration, disturbances in coordination, falling down, headaches, inability to speak, memory loss, numbness, poor balance, seizures, sensation of room spinning, tingling, tremors, visual disturbances, and weakness. Psych:  Denies alternate hallucination ( auditory/visual), anxiety, depression, easily angered, easily tearful, irritability, mental problems, panic attacks, sense of great danger, suicidal thoughts/plans, thoughts of violence, unusual visions or sounds, and thoughts /plans of harming others.  Physical Exam  General:  Well-developed,well-nourished,in no acute distress; alert,appropriate and cooperative throughout examination Head:  Normocephalic and atraumatic without obvious abnormalities. No apparent alopecia or balding. Eyes:  No corneal or conjunctival inflammation noted. EOMI. Perrla. Funduscopic exam benign, without hemorrhages, exudates or papilledema. Vision grossly normal. Ears:  External ear exam shows no significant lesions or deformities.  Otoscopic examination reveals clear canals, tympanic membranes are intact bilaterally without bulging, retraction, inflammation or discharge. Hearing is grossly normal bilaterally. Nose:  nasal congestion, mild Mouth:  Oral mucosa and oropharynx without lesions or exudates.  Teeth in good repair. Abdomen:  Bowel sounds positive,abdomen soft and non-tender without masses, organomegaly or hernias noted. Rectal:  anoscopic exam reveals inflammed mucosa with beginning anal fissure Genitalia:  Testes bilaterally descended without nodularity, tenderness or masses. No scrotal masses or lesions. No penis lesions or urethral discharge. Prostate:  Prostate gland firm and smooth, no enlargement, nodularity, tenderness, mass, asymmetry or induration. Msk:  No deformity or  scoliosis noted of thoracic or lumbar spine.   Pulses:  R and L carotid,radial,femoral,dorsalis pedis and posterior tibial pulses are full and equal bilaterally Extremities:  No clubbing, cyanosis, edema, or deformity noted with normal full range of motion of all joints.   Neurologic:  No cranial nerve deficits noted. Station and gait are normal. Plantar reflexes are down-going bilaterally. DTRs are symmetrical throughout. Sensory, motor and coordinative functions appear intact. Skin:  benign lesions on sevral areas of face and lesion on chin Cervical Nodes:  No lymphadenopathy noted Axillary Nodes:  No palpable lymphadenopathy Inguinal Nodes:  No significant adenopathy Psych:  Cognition and judgment appear intact. Alert and cooperative with normal attention span and concentration. No apparent delusions, illusions, hallucinations   Impression & Recommendations:  Problem # 1:  PROCTITIS (VWU-981.19) Assessment Deteriorated  Orders: Prescription Created Electronically [phone])  Problem # 2:  ARTHRITIS (ICD-716.90) Assessment: Unchanged  Problem # 3:  GERD (ICD-530.81) Assessment: Unchanged  His updated medication list for this problem includes:    Omeprazole 40 Mg Cpdr (Omeprazole) .Marland Kitchen... 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 Prn His updated medication list for this problem includes:    Naprosyn 125 Mg/33ml Susp (Naproxen) .Marland KitchenMarland KitchenMarland KitchenMarland Kitchen 4 tsp am and pm  Problem # 7:  ALLERGIC RHINITIS (ICD-477.9) Assessment: Unchanged  His updated medication list for this problem includes:    Astepro 0.15 %  Soln (Azelastine hcl) .Marland Kitchen... 2  sprays  morning and  midafternoon  Complete Medication List: 1)  Diazepam 5 Mg Tabs (Diazepam) .Marland Kitchen.. 1 tablet by mouth three times a day 2)  Finasteride 5 Mg Tabs (Finasteride) .Marland Kitchen.. 1 qd 3)  Imipramine Hcl 25 Mg Tabs (Imipramine hcl) .Marland Kitchen.. 1 tid 4)  Omeprazole 40 Mg Cpdr (Omeprazole) .Marland Kitchen.. 1 two times a day 5)  Hydroxyzine Hcl 25 Mg Tabs (Hydroxyzine hcl) .Marland Kitchen.. 1 by mouth three times a day 6)  Naprosyn 125 Mg/46ml Susp (Naproxen) .... 4 tsp am and pm 7)  Tylenol Codeine Liquid  .Marland Kitchen.. 2-3 tsp q4h as needed pain 8)  Entex Liquid  .... 2 tsp three times a day for congestion 9)  Astepro 0.15 % Soln (Azelastine hcl) .... 2  sprays  morning and  midafternoon 10)  Analpram-hc Singles 1-2.5 % Crea (Hydrocortisone ace-pramoxine) .... Insert 1 applicator two times a day  Other Orders: Tetanus Toxoid w/Dx (78469) Admin 1st Vaccine (62952)  Patient Instructions: 1)  Physical examination reveals healthy male with proctitis , tx with analpram HC 2.5% 2)  Prostate  and PSA normal, no sign or nfinding of cancer or other abnormality 3)  To check on surgeorn to remove lesion on chin, other spots on face are benign and need  no tx 4)  Aligh should help hyperactivity of bowel and gas 5)  continue regular medications 6)  notify me as to what the neurologist says about brain spot Prescriptions: ANALPRAM-HC SINGLES 1-2.5 % CREA (HYDROCORTISONE ACE-PRAMOXINE) insert 1 applicator two times a day  #1 pkge x 4   Entered and Authorized by:   Judithann Sheen MD   Signed by:   Judithann Sheen MD on 11/01/2008   Method used:   [facility]        [facility]  [address] [phone][facility] (retail)       [address]       [place], [place]  [address]       Ph: [phone] or [phone]       Fax: [phone]   RxID:   [id number]      Immunizations Administered:  Tetanus Vaccine:    Vaccine Type: Td    Site: left deltoid    Mfr: [employer] Pasteur    Dose: 0.5 ml     Route: IM    Given by: Pura Spice, RN    Exp. Date: 07/13/2010    Lot #: [id number]

## 2010-07-18 NOTE — Assessment & Plan Note (Signed)
Summary: chest pain,arm and leg,sinus problem/jls   Vital Signs:  Patient Profile:   50 Years Old Male Height:     71 inches Weight:      192 pounds O2 Sat:      95 % Temp:     98.3 degrees F Pulse rate:   96 / minute BP sitting:   136 / 82                 Chief Complaint:  Chest Pain.  History of Present Illness: Pt complains of chest pain on exertion, also chest wall pain on pressure gerd is bad now even taking omeprazole plus anacid, contines to be uncomfortable having jointpains, shoulders, elbows,  hips ,knees, ankles also complains of dysuria and pain  or burning on ejaculation which has been an unexplained long term problem, unexplained by urologist pt has not taken naproxen because of GI problem complains of ear ache, rt and nasal congestion general malaise burning on ejaculation EKG normal    Current Allergies: ! SELDANE AMOXICILLIN (AMOXICILLIN)     Review of Systems      See HPI  General      Complains of malaise and weakness.  ENT      Complains of nasal congestion and sore throat.  CV      Complains of chest pain or discomfort.  Resp      Complains of cough.  GI      Complains of gas and indigestion.  GU      See HPI      Complains of dysuria.      Denies decreased libido, discharge, erectile dysfunction, genital sores, hematuria, incontinence, nocturia, urinary frequency, and urinary hesitancy.  MS      Complains of joint pain.   Physical Exam  General:     Well-developed,well-nourished,in no acute distress; alert,appropriate and cooperative throughout examination Head:     Normocephalic and atraumatic without obvious abnormalities. No apparent alopecia or balding. Eyes:     glassespupils round.   Ears:     rt TM pink and dull., left normal Nose:     mild congestion Mouth:     pharyngeal erythema.   Neck:     No deformities, masses, or tenderness noted. Chest Wall:     No deformities, masses, tenderness or gynecomastia  noted. Lungs:     Normal respiratory effort, chest expands symmetrically. Lungs are clear to auscultation, no crackles or wheezes. Heart:     Normal rate and regular rhythm. S1 and S2 normal without gallop, murmur, click, rub or other extra sounds. Abdomen:     rt epigastric tenderness Rectal:     No external abnormalities noted. Normal sphincter tone. No rectal masses or tenderness. Genitalia:     Testes bilaterally descended without nodularity, tenderness or masses. No scrotal masses or lesions. No penis lesions or urethral discharge. Prostate:     Prostate gland firm and smooth, no enlargement, nodularity, tenderness, mass, asymmetry or induration. Msk:     tender lumbar spine Pulses:     R and L carotid,radial,femoral,dorsalis pedis and posterior tibial pulses are full and equal bilaterally Extremities:     No clubbing, cyanosis, edema, or deformity noted with normal full range of motion of all joints.   Neurologic:     No cranial nerve deficits noted. Station and gait are normal. Plantar reflexes are down-going bilaterally. DTRs are symmetrical throughout. Sensory, motor and coordinative functions appear intact. Skin:     Intact without suspicious  lesions or rashes Cervical Nodes:     No lymphadenopathy noted Axillary Nodes:     No palpable lymphadenopathy Inguinal Nodes:     No significant adenopathy Psych:     Cognition and judgment appear intact. Alert and cooperative with normal attention span and concentration. No apparent delusions, illusions, hallucinations    Impression & Recommendations:  Problem # 1:  CHEST PAIN (ICD-786.50) Assessment: New  Orders: EKG w/ Interpretation (93000)   Problem # 2:  OTITIS MEDIA, ACUTE (ICD-382.9) Assessment: New  His updated medication list for this problem includes:    Tylenol 325 Mg Tabs (Acetaminophen) .Marland Kitchen... Prn    Naprosyn 125 Mg/57ml Susp (Naproxen) .Marland KitchenMarland KitchenMarland KitchenMarland Kitchen 4 tsp am and pm    Avelox 400 Mg Tabs (Moxifloxacin hcl) .Marland Kitchen... 1  each day    Cephalexin 250 Mg/6ml Susr (Cephalexin) .Marland Kitchen... 2 tsp three times a day   Problem # 3:  RHINITIS, BACTERIAL (ICD-460) Assessment: Unchanged  His updated medication list for this problem includes:    Tylenol 325 Mg Tabs (Acetaminophen) .Marland Kitchen... Prn    Naprosyn 125 Mg/44ml Susp (Naproxen) .Marland KitchenMarland KitchenMarland KitchenMarland Kitchen 4 tsp am and pm   Problem # 4:  ACUTE PHARYNGITIS (ICD-462) Assessment: Deteriorated  His updated medication list for this problem includes:    Tylenol 325 Mg Tabs (Acetaminophen) .Marland Kitchen... Prn    Naprosyn 125 Mg/58ml Susp (Naproxen) .Marland KitchenMarland KitchenMarland KitchenMarland Kitchen 4 tsp am and pm    Avelox 400 Mg Tabs (Moxifloxacin hcl) .Marland Kitchen... 1 each day    Cephalexin 250 Mg/69ml Susr (Cephalexin) .Marland Kitchen... 2 tsp three times a day   Problem # 5:  ARTHRITIS (ICD-716.90) Assessment: Deteriorated  Problem # 6:  GERD (ICD-530.81) Assessment: Deteriorated  His updated medication list for this problem includes:    Omeprazole 40 Mg Cpdr (Omeprazole) .Marland Kitchen... 1 two times a day   Complete Medication List: 1)  Diazepam 5 Mg Tabs (Diazepam) .Marland Kitchen.. 1 tablet by mouth three times a day 2)  Finasteride 5 Mg Tabs (Finasteride) .Marland Kitchen.. 1 qd 3)  Imipramine Hcl 25 Mg Tabs (Imipramine hcl) .Marland Kitchen.. 1 tid 4)  Tylenol 325 Mg Tabs (Acetaminophen) .... Prn 5)  Omeprazole 40 Mg Cpdr (Omeprazole) .Marland Kitchen.. 1 two times a day 6)  Hydroxyzine Hcl 25 Mg Tabs (Hydroxyzine hcl) .Marland Kitchen.. 1 by mouth three times a day 7)  Naprosyn 125 Mg/49ml Susp (Naproxen) .... 4 tsp am and pm 8)  Analpram-hc Singles 1-2.5 % Crea (Hydrocortisone ace-pramoxine) .... Insert i applicator two times a day for proctitis 9)  Tylenol Codeine Liquid  .Marland Kitchen.. 2-3 tsp q4h as needed pain 10)  Entex Liquid  .... 2 tsp three times a day for congestion 11)  Avelox 400 Mg Tabs (Moxifloxacin hcl) .Marland Kitchen.. 1 each day 12)  Cephalexin 250 Mg/70ml Susr (Cephalexin) .... 2 tsp three times a day 13)  Astepro 0.15 % Soln (Azelastine hcl) .... 2  sprays moen midafternoon  Other Orders: UA Dipstick w/Micro (automated) (81001)    Patient Instructions: 1)  cephalexin liquid for infectio 2)  entex la for congestion 3)  omeprazole caps two times a day 4)  naproxen for joint pain 5)  Depomedrol 120 mg IM   Prescriptions: HYDROXYZINE HCL 25 MG  TABS (HYDROXYZINE HCL) 1 by mouth three times a day  #90 x 11   Entered and Authorized by:   Judithann Sheen MD   Signed by:   Judithann Sheen MD on 07/06/2008   Method used:   [facility]ron[facility] to        [facility] [address] [phone]* (retail)       [address]       [place], [place]  [address]       Ph: [phone] or [phone]       Fax: [phone]   RxID:   [id number] FINASTERIDE 5 MG  TABS (FINASTERIDE) 1 qd  #30 x 11   Entered and Authorized by:   Judithann Sheen MD   Signed by:   Judithann Sheen MD on 07/06/2008   Method used:   [facility]ically [facility]        [facility] [phone]* (retail)       [address]       [place], [place]  [address]       Ph: [phone] or [phone]       Fax: [phone]   RxID:   [id number] OMEPRAZOLE 40 MG  CPDR (OMEPRAZOLE) 1 two times a day  #60 x 11   Entered and Authorized by:   Judithann Sheen MD   Signed by:   Judithann Sheen MD on 07/06/2008   Method used:   [facility]ically to        [facility] [phone]* (retail)       [address]       [place], [place]  [address]       Ph: [phone] or [phone]       Fax: [phone]   RxID:   [id number] IMIPRAMINE HCL 25 MG  TABS (IMIPRAMINE HCL) 1 tid  #90 x 11   Entered and Authorized by:   Judithann Sheen MD   Signed by:   Judithann Sheen MD on 07/06/2008   Method used:   [facility]ronically to        [facility] [phone]* (retail)       [address]       [place], [place]  [address]       Ph: [phone] or [phone]       Fax: [phone]   RxID:   [id number] CEPHALEXIN 250 MG/5ML SUSR (CEPHALEXIN) 2 tsp three times a day  #300cc x 1   Entered and  Authorized by:   Judithann Sheen MD   Signed by:   Judithann Sheen MD on 07/06/2008   Method used:   [facility] to        [facility] [phone]* (retail)       [address]       [place], [place]  [address]       Ph: [phone] or [phone]       Fax: [phone]   RxID:   [id number] ENTEX LIQUID 2 tsp three times a day for congestion  #300cc x 11   Entered and Authorized by:   Judithann Sheen MD   Signed by:   Judithann Sheen MD on 07/06/2008   Method used:   Print then Give to Patient   RxID:   [id number] CEPHALEXIN 250 MG/5ML SUSR (CEPHALEXIN) 2 tsp three times a day  #300cc x 1   Entered and Authorized by:   Judithann Sheen MD   Signed by:   Judithann Sheen MD on 07/06/2008   Method used:   [facility]ically to        [facility] [phone]* (retail)       [phone]  [address]       [place], [place]  [address]       Ph: [phone] or [phone]       Fax: [phone]   RxID:   [id number]   Laboratory Results   Urine Tests   Date/Time Reported: July 06, 2008 3:17 PM   Routine Urinalysis   Color: yellow Appearance: Clear Glucose: negative   (Normal Range: Negative) Bilirubin: negative   (Normal Range: Negative) Ketone: negative   (Normal Range: Negative) Spec. Gravity: 1.015   (Normal Range: 1.003-1.035) Blood: negative   (Normal Range: Negative) pH: 7.0   (Normal Range: 5.0-8.0) Protein: 1+   (Normal Range: Negative) Urobilinogen: 0.2   (Normal Range: 0-1) Nitrite: negative   (Normal Range: Negative) Leukocyte Esterace: negative   (Normal Range: Negative)    Comments: Wynona Canes, CMA  July 06, 2008 3:17 PM

## 2010-07-18 NOTE — Progress Notes (Signed)
Summary: Pt wants to get results from stress test  Phone Note Call from Patient Call back at Home Phone [phone]   Caller: Patient Summary of Call: Pt called to get results from Stress test and also needs to see about getting another order to have colonoscopy done at [facility], because [facility] said that they bill procedures, in a way,  that would leave the pt having to pay a lot of co-ins. Pls fax order to [facility]  fax # [phone] attn Kennyth Arnold.    Pt wants to know if he should see a GI doctor for the procedure or a General Surgeon? Pls advise.  Initial call taken by: Lucy Antigua,  February 14, 2010 2:41 PM  Follow-up for Phone Call        notify stress test essentially normal, no further recommendations there was some mild apical thinning but no concerning ischemic changes noticed. He can have the referral to [facility] as he has requested. Both general surgeons and gastroenterologists can do colonoscopies well if they have the training so either would be fine. Follow-up by: Danise Edge MD,  February 14, 2010 4:13 PM  Additional Follow-up for Phone Call Additional follow up Details #1::        pt informed. pt wants to go to [facility] for the colonoscopy. Additional Follow-up by: Josph Macho RMA,  February 14, 2010 4:48 PM

## 2010-07-18 NOTE — Progress Notes (Signed)
Summary: pt needs a work in appt for procedure  Phone Note Call from Patient Call back at [employer] [phone]   Caller: Patient Call For: Judithann Sheen MD Summary of Call: Pt has a mole in private area that needs to be removed. Pt says that it painful to sit down. Pt needs a work in appt. Needs appt asap.  Initial call taken by: Lucy Antigua,  February 05, 2009 10:42 AM  Follow-up for Phone Call        When do you want to do this? There are no 30 min. slots available anytime this week and the patient wants to be seen soon. Follow-up by: Darra Lis RMA,  February 05, 2009 4:18 PM  Additional Follow-up for Phone Call Additional follow up Details #1::        appt today with dr Scotty Court.  Additional Follow-up by: Pura Spice, RN,  February 08, 2009 10:12 AM

## 2010-07-18 NOTE — Assessment & Plan Note (Signed)
Summary: 1 MONTH FUP/CJR   Vital Signs:  Patient profile:   50 year old male Weight:      192 pounds Temp:     98.7 degrees F oral Pulse rate:   103 / minute Pulse rhythm:   regular BP sitting:   132 / 100  (left arm) Cuff size:   regular  Vitals Entered By: Alfred Levins, CMA (May 23, 2010 1:21 PM) CC: f/u   History of Present Illness: S. 50 year old white divorced male he is in today complaining of arthritis of multiple joints as well as a problem with allergic rhinitis he is scheduled for physical with the end of the month but needed some treatment for his present problems at this time patient is also concerned about restarting her Coumadin  Current Medications (verified): 1)  Diazepam 5 Mg Tabs (Diazepam) .Marland Kitchen.. 1 Tablet By Mouth Three Times A Day As Needed 2)  Omeprazole 40 Mg  Cpdr (Omeprazole) .Marland Kitchen.. 1 Two Times A Day For Genella Rife As Needed (Holding) 3)  Hydroxyzine Hcl 25 Mg  Tabs (Hydroxyzine Hcl) .Marland Kitchen.. 1 By Mouth Three Times A Day As Needed 4)  Naprosyn 125 Mg/107ml  Susp (Naproxen) .... 4 Tsp Am and Pm As Needed 5)  Imipramine Hcl 25 Mg Tabs (Imipramine Hcl) .... Once Daily X4 Days, Then Two Times A Day X4 Days, Then Three Times A Day 6)  Cortisporin 3.5-10000-1 Soln (Neomycin-Polymyxin-Hc) .... 4 Drops B/l Ears Three Times A Day X 10 Days 7)  Nitrostat 0.4 Mg Subl (Nitroglycerin) .Marland Kitchen.. 1 Tab By Victorio Palm As Needed Cp Repeat Q X 3 Doses If Pain Is Persistent. If No Resolution After 3 Doses To Er 8)  Metoprolol Tartrate 25 Mg Tabs (Metoprolol Tartrate) .Marland Kitchen.. 1 Tab By Mouth By Mouth Two Times A Day, May Crush in Food 9)  Flonase 50 Mcg/act Susp (Fluticasone Propionate) .... 2 Sprays Each Nostril Daily 10)  Anusol-Hc 25 Mg Supp (Hydrocortisone Acetate) .Marland Kitchen.. 1 Pr Two Times A Day X 7 Days and Then As Needed Rectal Irritation After That 11)  Align 4 Mg Caps (Probiotic Product) .Marland Kitchen.. 1 Cap By Mouth Daily As Needed Antibiotic Use  Allergies (verified): 1)  ! Seldane 2)  Amoxicillin  (Amoxicillin)  Past History:  Past Medical History: Last updated: 02/07/2010 Allergic rhinitis Headache PITUITARY ADENOMA NEPROLETHIASIS Stroke Sleep Apnea pineal gland cyst  Past Surgical History: Last updated: 02/07/2010 Cataract extraction three times osteod esteoma surgery right ear surgery  Social History: Last updated: 02/07/2010 Patient exposed to 2nd hand smoke (heavy) Alcohol Use - no Illicit Drug Use - no  Risk Factors: Smoking Status: quit (02/19/2007)  Review of Systems      See HPI Eyes:  Denies blurring, discharge, double vision, eye irritation, eye pain, halos, itching, light sensitivity, red eye, vision loss-1 eye, and vision loss-both eyes. ENT:  Complains of nasal congestion and sinus pressure. CV:  Denies bluish discoloration of lips or nails, chest pain or discomfort, difficulty breathing at night, difficulty breathing while lying down, fainting, fatigue, leg cramps with exertion, lightheadness, near fainting, palpitations, shortness of breath with exertion, swelling of feet, swelling of hands, and weight gain. Resp:  Denies chest discomfort, chest pain with inspiration, cough, coughing up blood, excessive snoring, hypersomnolence, morning headaches, pleuritic, shortness of breath, sputum productive, and wheezing. MS:  Complains of joint pain.  Physical Exam  General:  Well-developed,well-nourished,in no acute distress; alert,appropriate and cooperative throughout examination Head:  Normocephalic and atraumatic without obvious abnormalities. No apparent alopecia  or balding. Eyes:  No corneal or conjunctival inflammation noted. EOMI. Perrla. Funduscopic exam benign, without hemorrhages, exudates or papilledema. Vision grossly normal. Ears:  External ear exam shows no significant lesions or deformities.  Otoscopic examination reveals clear canals, tympanic membranes are intact bilaterally without bulging, retraction, inflammation or discharge. Hearing is  grossly normal bilaterally. Nose:  swollen boggy pale nasal mucosa bilaterally Mouth:  Oral mucosa and oropharynx without lesions or exudates.  Teeth in good repair. Lungs:  Normal respiratory effort, chest expands symmetrically. Lungs are clear to auscultation, no crackles or wheezes. Heart:  Normal rate and regular rhythm. S1 and S2 normal without gallop, murmur, click, rub or other extra sounds. Msk:  tenderness PIP joints as well as tenderness knees bilaterally Extremities:  No clubbing, cyanosis, edema, or deformity noted with normal full range of motion of all joints.     Impression & Recommendations:  Problem # 1:  ARTHRITIS (ICD-716.90) Assessment Deteriorated Depo-Medrol 120 mg IM  Problem # 2:  ANXIETY DEPRESSION (ICD-300.4) Assessment: Deteriorated restart imipramine 3 h.s.  Problem # 3:  ALLERGIC RHINITIS (ICD-477.9)  His updated medication list for this problem includes:    Flonase 50 Mcg/act Susp (Fluticasone propionate) .Marland Kitchen... 2 sprays each nostril daily Depo-Medrol I am  Complete Medication List: 1)  Diazepam 5 Mg Tabs (Diazepam) .Marland Kitchen.. 1 tablet by mouth three times a day as needed 2)  Omeprazole 40 Mg Cpdr (Omeprazole) .Marland Kitchen.. 1 two times a day for gerd as needed (holding) 3)  Hydroxyzine Hcl 25 Mg Tabs (Hydroxyzine hcl) .Marland Kitchen.. 1 by mouth three times a day as needed 4)  Naprosyn 125 Mg/70ml Susp (Naproxen) .... 4 tsp am and pm as needed 5)  Imipramine Hcl 25 Mg Tabs (Imipramine hcl) .... Once daily x4 days, then two times a day x4 days, then three times a day 6)  Cortisporin 3.5-10000-1 Soln (Neomycin-polymyxin-hc) .... 4 drops b/l ears three times a day x 10 days 7)  Nitrostat 0.4 Mg Subl (Nitroglycerin) .Marland Kitchen.. 1 tab by mouh sl as needed cp repeat q x 3 doses if pain is persistent. if no resolution after 3 doses to er 8)  Metoprolol Tartrate 25 Mg Tabs (Metoprolol tartrate) .Marland Kitchen.. 1 tab by mouth by mouth two times a day, may crush in food 9)  Flonase 50 Mcg/act Susp  (Fluticasone propionate) .... 2 sprays each nostril daily 10)  Anusol-hc 25 Mg Supp (Hydrocortisone acetate) .Marland Kitchen.. 1 pr two times a day x 7 days and then as needed rectal irritation after that 11)  Align 4 Mg Caps (Probiotic product) .Marland Kitchen.. 1 cap by mouth daily as needed antibiotic use  Patient Instructions: 1)  Please schedule a follow-up appointment in 1 month.  2)  discontinue Nortriptyline and start Impramine 25mg  now Prescriptions: IMIPRAMINE HCL 25 MG TABS (IMIPRAMINE HCL) once daily X4 days, then two times a day X4 days, then three times a day  #90 x 5   Entered and Authorized by:   Judithann Sheen MD   Signed by:   Judithann Sheen MD on 05/23/2010   Method used:   [facility]        [facility] [phone] ([facility])       [address]       [place], [place]  [address]       Ph: [phone] or [phone]       Fax: [phone]   RxID:   [id number]    Orders Added: 1)  Est. Patient Level III [id number]

## 2010-07-18 NOTE — Progress Notes (Signed)
Summary: lost depression meds  Phone Note Call from Patient Call back at Home Phone [phone]   Caller: Patient Call For: STAFFORD Reason for Call: Talk to Nurse Summary of Call: WANTS A CALL BACK CONCERNING HIS DEPRESSION MEDS AND HAVING PROBLEMS WITH THE AMIPROMINE(?) Initial call taken by: Barnie Mort,  February 03, 2007 10:55 AM  Follow-up for Phone Call        out of meds, not as tired now ,more wired now, H/A's again, lost meds  Follow-up by: Arcola Jansky, RN,  February 03, 2007 11:00 AM  Additional Follow-up for Phone Call Additional follow up Details #1::        called by dr Scotty Court Additional Follow-up by: Judithann Sheen MD,  February 03, 2007 1:32 PM

## 2010-07-18 NOTE — Progress Notes (Signed)
----   Converted from flag ---- ---- 03/27/2010 9:29 AM, Danise Edge MD wrote: what ever he was taking before, three times a day  is fine would have him start back at 1 once daily  x 4 d, then two times a day -x 4d then up to tid--- 03/27/2010 8:47 AM, Josph Macho RMA wrote: How many times a day do you want him taking this? ------------------------------

## 2010-07-18 NOTE — Letter (Signed)
Summary: Add Endo /Epigastric pain  Add Endo /Epigastric pain   Imported By: Maryln Gottron 02/28/2010 10:17:35  _____________________________________________________________________  External Attachment:    Type:   Image     Comment:   External Document

## 2010-07-18 NOTE — Assessment & Plan Note (Signed)
Summary: mole in private area/painful/work in per Gina/cjr   Vital Signs:  Patient profile:   50 year old male BP sitting:   120 / 80  (left arm)  Vitals Entered By: Pura Spice, RN (February 08, 2009 1:41 PM) CC: ck rectal area  and ? skin tag   History of Present Illness: pt in to ck what sounds like skin polyp near rectal area which decribed as small and  not painful stated mylagia improved since last visit as well as rhin itis.  continuing crestor and astepro as instructed.   Allergies: 1)  ! Seldane 2)  Amoxicillin (Amoxicillin)  Review of Systems      See HPI  The patient denies anorexia, fever, weight loss, weight gain, vision loss, decreased hearing, hoarseness, chest pain, syncope, dyspnea on exertion, peripheral edema, prolonged cough, headaches, hemoptysis, abdominal pain, melena, hematochezia, severe indigestion/heartburn, hematuria, incontinence, genital sores, muscle weakness, suspicious skin lesions, transient blindness, difficulty walking, depression, unusual weight change, abnormal bleeding, enlarged lymph nodes, angioedema, breast masses, and testicular masses.   GU:  See HPI; skin tag around rectum.  Physical Exam  General:  Well-developed,well-nourished,in no acute distress; alert,appropriate and cooperative throughout examination Nose:  nasal mucosa improved with decreased drainage Rectal:  small skin tag near rt lateral of anus    Impression & Recommendations:  Problem # 1:  RESIDUAL HEMORRHOIDAL SKIN TAGS (ICD-455.9) Assessment New  Problem # 2:  MYALGIA (ICD-729.1) Assessment: Improved  His updated medication list for this problem includes:    Naprosyn 125 Mg/39ml Susp (Naproxen) .Marland KitchenMarland KitchenMarland KitchenMarland Kitchen 4 tsp am and pm  Problem # 3:  RHINITIS (ICD-477.9) Assessment: Improved  His updated medication list for this problem includes:    Astepro 0.15 % Soln (Azelastine hcl) .Marland Kitchen... 2  sprays  morning and  midafternoon  Problem # 4:  PROSTATITIS, ACUTE (ICD-601.0)  Assessment: Improved  Complete Medication List: 1)  Diazepam 5 Mg Tabs (Diazepam) .Marland Kitchen.. 1 tablet by mouth three times a day 2)  Finasteride 5 Mg Tabs (Finasteride) .Marland Kitchen.. 1 qd 3)  Imipramine Hcl 25 Mg Tabs (Imipramine hcl) .Marland Kitchen.. 1 tid 4)  Omeprazole 40 Mg Cpdr (Omeprazole) .Marland Kitchen.. 1 two times a day 5)  Hydroxyzine Hcl 25 Mg Tabs (Hydroxyzine hcl) .Marland Kitchen.. 1 by mouth three times a day 6)  Naprosyn 125 Mg/50ml Susp (Naproxen) .... 4 tsp am and pm 7)  Tylenol Codeine Liquid  .Marland Kitchen.. 2-3 tsp q4h as needed pain 8)  Entex Liquid  .... 2 tsp three times a day for congestion 9)  Astepro 0.15 % Soln (Azelastine hcl) .... 2  sprays  morning and  midafternoon 10)  Analpram-hc Singles 1-2.5 % Crea (Hydrocortisone ace-pramoxine) .... Insert 1 applicator two times a day 11)  Levaquin 500 Mg Tabs (Levofloxacin) .Marland Kitchen.. 1 qd  Patient Instructions: 1)  no tx needed for skin tag  2)  cont other meds as prescribed earlier   Orders Added: 1)  Est. Patient Level IV [16109]

## 2010-07-19 ENCOUNTER — Other Ambulatory Visit: Payer: Self-pay

## 2010-07-19 DIAGNOSIS — F419 Anxiety disorder, unspecified: Secondary | ICD-10-CM

## 2010-07-19 MED ORDER — DIAZEPAM 5 MG PO TABS: 5.0000 mg | ORAL_TABLET | Freq: Three times a day (TID) | ORAL | Status: DC

## 2010-07-19 NOTE — Telephone Encounter (Signed)
Referral to Dr. Ewing Schlein or [facility], pt had rectal bleed found by Dr. Rodena Medin on rectal exam.

## 2010-07-19 NOTE — Telephone Encounter (Signed)
Called pt and will refer to [facility]

## 2010-07-23 ENCOUNTER — Ambulatory Visit: Payer: Self-pay | Admitting: Gastroenterology

## 2010-07-24 NOTE — Progress Notes (Signed)
Summary: Cancel office visit/ Switched GI care   Phone Note Outgoing Call Call back at [facility] Phone [phone]   Call placed by: Christie Nottingham CMA Duncan Dull),  July 18, 2010 4:17 PM Call placed to: Patient Summary of Call: Called pt to notify him that we are cancelling his office visit at this time due to him switching GI care in September. Pt saw [place] in August of 2011 and afterwards we scheduled him for a EGD/Colonosopy which the patient cancelled. Pt decided to go to [facility] b/c he states it was 300 dollars cheaper for him. Told pt that our policy states that if he has switched GI care or has had prior GI hx that Dr. Russella Dar will need all of records to review and decide if we are going to see him as a patient again. At this time we are cancelling his office visit. Patient states he doesn't understand why he can't go to another place to have his procedures if it is cheaper and he is having chronic rectal bleeding. I told patient if he is having GI problems that since he has switched care, he can go see his current GI physician for these problems. Pt states he has called our office multiple times to get a sooner appt. I told him that we do not have record if him calling our office but just Dr. Laurita Quint office. Pt proceeds to tell me that the medical board would be interested in this scenerio. I informed patient that this is a Adult nurse Gastroenterology policy and we have to accept the patient back into our office before we can see him as a patient again since he switched care. I once again informed patient that we are cancelling his office visit at this time. I asked the patient if he wanted to be accepted back and patient stated "hell no". Told the patient thank you and he and I hung up. Initial call taken by: Christie Nottingham CMA Duncan Dull),  July 18, 2010 4:34 PM  Follow-up for Phone Call        Above noted. It would be best for his care if he went to the local gastroenterologist  who performed his procedures. I willl send a flag to Drs. Hodgin and Jenkins. Follow-up by: Meryl Dare MD Clementeen Graham,  July 18, 2010 8:11 PM

## 2010-07-25 ENCOUNTER — Encounter: Payer: Self-pay | Admitting: Internal Medicine

## 2010-07-25 ENCOUNTER — Ambulatory Visit (INDEPENDENT_AMBULATORY_CARE_PROVIDER_SITE_OTHER): Payer: BC Managed Care – PPO | Admitting: Internal Medicine

## 2010-07-25 VITALS — BP 122/82 | HR 92 | Temp 98.0°F | Ht 71.0 in | Wt 178.0 lb

## 2010-07-25 DIAGNOSIS — M2550 Pain in unspecified joint: Secondary | ICD-10-CM

## 2010-07-25 DIAGNOSIS — R209 Unspecified disturbances of skin sensation: Secondary | ICD-10-CM

## 2010-07-25 DIAGNOSIS — K625 Hemorrhage of anus and rectum: Secondary | ICD-10-CM

## 2010-07-25 DIAGNOSIS — R202 Paresthesia of skin: Secondary | ICD-10-CM

## 2010-07-25 HISTORY — DX: Pain in unspecified joint: M25.50

## 2010-07-25 HISTORY — DX: Paresthesia of skin: R20.2

## 2010-07-25 NOTE — Assessment & Plan Note (Signed)
Patient notes improvement but no resolution. Reattempt GI referral

## 2010-07-25 NOTE — Progress Notes (Signed)
  Subjective:    Patient ID: Chase [name], male    DOB: Feb 11, 1961, 50 y.o.   MRN: [id number]  HPI  Patient presents in clinic as a work in for evaluation of color changes of hand and numbness. States several month history of intermittent numbness and tingling bilateral hands possibly worse in the first three fingers. No injury or trauma and has full range of motion. Has some left wrist pain with flexion. Does note intermittent color changes including red and white of fingers especially with exposure to cold. Is concerned over possible Raynaud's in other associated autoimmune problems. Last evaluated for  Intermittent rectal bleeding and was referred to GI. States was unable to see his gastroenterologist.   reviewed past medical history, medications and allergies.  Review of Systems  Constitutional: Negative for fever and chills.  Gastrointestinal: Positive for blood in stool.  Musculoskeletal: Positive for arthralgias. Negative for joint swelling.  Skin: Positive for color change. Negative for rash.       Objective:   Physical Exam  Constitutional: He appears well-developed and well-nourished. No distress.  HENT:  Head: Normocephalic and atraumatic.  Eyes: Conjunctivae are normal.  Cardiovascular:  Pulses:      Radial pulses are 2+ on the right side, and 2+ on the left side.  Musculoskeletal:       Left wrist: Normal. He exhibits normal range of motion, no tenderness, no swelling and no effusion.       Right hand: He exhibits normal range of motion, no tenderness and no swelling. normal sensation noted.        Equivocal Phalen's left. No thenar muscle wasting.  Skin: Skin is warm and dry. No rash noted. He is not diaphoretic. No erythema. No pallor.          Assessment & Plan:

## 2010-07-25 NOTE — Assessment & Plan Note (Signed)
Suspect possible CTS. Prescription for bilateral wrist splints provided.

## 2010-07-25 NOTE — Assessment & Plan Note (Signed)
Has potential color changes consistent with Raynaud's phenomenon. Obtain ANA

## 2010-07-26 ENCOUNTER — Telehealth: Payer: Self-pay

## 2010-07-26 LAB — ANA: Anti Nuclear Antibody(ANA): NEGATIVE

## 2010-07-26 NOTE — Telephone Encounter (Signed)
Pt aware. Wants advise on what, if anything, to do next and notes that if Dr. Rodena Medin wants him to go back to [facility] for gi issues, then he will do that. Pt wasn't sure if he wanted to go back to [facility] due to the distance but changed his mind after leaving the office yesterday.

## 2010-07-26 NOTE — Telephone Encounter (Signed)
Message copied by Kyung Rudd on Fri Jul 26, 2010 12:30 PM ------      Message from: Letitia Libra, Maisie Fus      Created: Fri Jul 26, 2010 12:08 PM       ANA blood test nl

## 2010-07-26 NOTE — Telephone Encounter (Signed)
Message copied by Kyung Rudd on Fri Jul 26, 2010 12:26 PM ------      Message from: Letitia Libra, Maisie Fus      Created: Fri Jul 26, 2010 12:08 PM       ANA blood test nl

## 2010-07-29 NOTE — Telephone Encounter (Signed)
Per Camelia Eng, she received a GI referral from Dr. Scotty Court, who is Mr. Langworthy PCP, and is working on that. She will call [facility] and if they refuse to see pt, then she will do the [facility] referral

## 2010-10-16 ENCOUNTER — Other Ambulatory Visit: Payer: Self-pay | Admitting: Family Medicine

## 2010-10-17 ENCOUNTER — Other Ambulatory Visit: Payer: Self-pay | Admitting: Family Medicine

## 2010-10-29 NOTE — Assessment & Plan Note (Signed)
[facility]                             PULMONARY OFFICE NOTE   NAME:[name], [name]                       MRN:          [id number]  DATE:01/28/2007                            DOB:          1961-05-14    SLEEP MEDICINE CONSULTATION   HISTORY OF PRESENT ILLNESS:  The patient is a 50 year old gentleman whom  I have been asked to see for sleeping difficulties.  The patient  recently underwent nocturnal polysomnography, where he was found to have  12 hypopneas and 6 obstructive apneas for an apnea/hypopnea index of  only 4 events per hour.  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  The patient states that his main complaint is very  significant inappropriate daytime sleepiness, which has been going on  for 3 years and is increasing in severity.  He has been told that he is  a loud snorer and has occasional choking spells.  He typically gets to  bed between 11 and 2 and gets up at 8 to 8:30 to start his day.  He is  not rested upon arising.  The patient states that he will fall asleep if  he sits down for any period of time.  He has noticed decreased memory  and concentration.  He has some dozing with TV and movies.  He has no  difficulties with driving.  The patient also notes that he has chronic  nasal congestion issues.  Of note, his weight is up about 20 pounds over  the last few years.   PAST MEDICAL HISTORY:  1. Significant for allergic rhinitis.  2. History of multiple tympanostomy tubes for vertigo/eustachian tube      dysfunction.   CURRENT MEDICATIONS:  1. Imipramine 25 mg 1 t.i.d.  2. Diazepam 5 mg daily to t.i.d.  3. Finasteride 5 mg daily.  4. Fexofenadine 180 daily.  5. __________  nasal spray 50 mcg 1 b.i.d.   The patient has no known drug allergies.   SOCIAL HISTORY:  He is divorced.  He does not have children.  He has a  history  of smoking socially when he was 50 years old and none since.   FAMILY HISTORY:  Remarkable for his father having emphysema and question  heart disease.  Mother had breast cancer.   REVIEW OF SYSTEMS:  As per the history of present illness.  Also see the  patient intake form documented in the chart.   PHYSICAL EXAM:  GENERAL:  He is a well-developed male in no acute  distress.  Blood pressure 122/84, pulse 106, temperature 98.2, weight 190 pounds.  O2 saturation on room air is 96%.  HEENT:  Pupils are equal, round, and reactive to light and  accommodation.  Extraocular muscles are intact.  Nares show mild septal  deviation to the left.  Oropharynx with mild elongation of the soft  palate and uvula.  NECK:  Supple without JVD or lymphadenopathy.  There is no palpable  thyromegaly.  CHEST:  Totally clear.  CARDIAC:  Reveals regular rate and rhythm with no murmurs, rubs, or  gallops.  ABDOMEN:  Soft, nontender, and nondistended with good bowel sounds.  GENITAL, RECTAL, AND BREASTS:  Not done and not indicated.  LOWER EXTREMITIES:  Without edema.  Good pulses distally.  No calf  tenderness.  NEUROLOGIC:  Alert and oriented with no obvious motor deficits.   IMPRESSION:  Inappropriate daytime sleepiness that I suspect is  secondary to his various medications that can make him sleepy, as well  as the upper airway resistant syndrome.  He does have bite issues and I  suspect he has a small posterior pharyngeal space secondary to  mandibular abnormalities.  He is currently under the care of an oral  surgeon.  I discussed various options for him, including modest weight  loss, attempts at positional therapy, oral appliance, and also CPAP.   PLAN:  1. I will speak with the patient's dentist about the possibility of      oral appliance versus mandibular advancement.  The other option is      CPAP if he needs something in the interim until oral surgery can      decide how to proceed.  2. The  patient will follow up after initiating CPAP, as I suspect that      is going to be his best option for therapy.     Barbaraann Share, MD,FCCP  Electronically Signed    [facility]  DD: 02/03/2007  DT: 02/04/2007  Job #: [id number]   cc:   Ellin Saba., MD

## 2010-10-29 NOTE — Procedures (Signed)
NAMETORIN, MODICA                ACCOUNT NO.:  [id number]   MEDICAL RECORD NO.:  [id number]          PATIENT TYPE:  OUT   LOCATION:  SLEEP CENTER                 FACILITY:  [facility]   PHYSICIAN:  Barbaraann Share, MD,FCCPDATE OF BIRTH:  Jun 16, 1961   DATE OF STUDY:  01/03/2007                            NOCTURNAL POLYSOMNOGRAM   REFERRING PHYSICIAN:  Tawny Asal, MD   LOCATION:  Sleep lab.   INDICATION FOR STUDY:  Hypersomnia with sleep apnea.   EPWORTH SLEEPINESS SCORE:  20.   SLEEP ARCHITECTURE:  The patient had a total sleep time of 294 minutes  with variable slow wave sleep and REM.  Sleep onset latency was very  rapid at 2-1/2 minutes, and REM onset was quite prolonged at 170  minutes.  Sleep efficiency was decreased at 77%.   RESPIRATORY DATA:  The patient was found to have 12 hypopneas and 6  obstructive apneas for an apnea-hypopnea index of only 4 events per  hour.  The patient was noted, however, to have very loud snoring and  changes in his nasal pressure transducer tracing that was suggestive of  RERAs.  The patient did not meet split night criteria secondary to the  small numbers of obstructive events.   OXYGEN DATA:  There was O2 desaturation as low as 91% with the patient's  obstructive events.   CARDIAC DATA:  Occasional PVCs but no clinically significant  arrhythmias.   MOVEMENT/PARASOMNIA:  None.   IMPRESSIONS/RECOMMENDATIONS:  1. Small numbers of obstructive events which do not meet the      apnea/hypopnea index criteria for the obstructive sleep apnea      syndrome.  However, the patient had very little slow wave sleep or      REM, and therefore his degree of sleep apnea may be underestimated.      The patient did have very loud snoring and changes on his nasal      pressure transducer tracing that was suggestive of the upper airway      resistance syndrome.  This is a presleep      apnea condition that can result in significant sleep disruption.      Clinical correlation is suggested.  2. Occasional premature ventricular contractions but no clinically      significant arrhythmia.      Barbaraann Share, MD,FCCP  Diplomate, American Board of Sleep  Medicine  Electronically Signed     [facility]MED[facility]  D:  01/15/2007 17:41:49  T:  01/16/2007 12:25:36  Job:  [id number]   cc:   Ellin Saba., MD  [address] [place]  [place] [address]

## 2010-11-01 NOTE — Op Note (Signed)
Chase [name], Chase [name]                            ACCOUNT NO.:  [id number]   MEDICAL RECORD NO.:  [id number]                   PATIENT TYPE:  AMB   LOCATION:  [facility]                                 FACILITY:  [facility]   PHYSICIAN:  Jamison Neighbor, M.D.               DATE OF BIRTH:  March 11, 1961   DATE OF PROCEDURE:  12/01/2003  DATE OF DISCHARGE:                                 OPERATIVE REPORT   PREOPERATIVE DIAGNOSIS:  Right ureteral calculus.   POSTOPERATIVE DIAGNOSIS:  Right ureteral stricture.   PROCEDURE:  1. Cystoscopy.  2. Right retrograde ureteropyelogram with interpretation.  3. Balloon dilation of right ureteral stricture.  4. Right flexible and rigid ureteroscopy including intraoperative     pyeloscopy.  5. Double-J catheter insertion.   SURGEON:  Jamison Neighbor, M.D.   ANESTHESIA:  General.   COMPLICATIONS:  None.   DRAINS:  A 7 French x 26 cm double-J catheter.   BRIEF HISTORY:  This 50 year old male presented to the office with pain on  the right hand side and CT evidence of a stone in the ureter.  This was  right at the [facility] joint.  The patient passed 1 small fragment, but follow-up  images still showed there was still some stone material present, and the  patient still had ongoing pain.  He may have passed another piece, but he is  still having problems on the right-hand side.  The pain has dropped down  somewhat lower, and it was thought that the stone might be more distal.  The  patient is now to undergo ureteroscopy and extraction of the stone if  present.  The patient understands the risks and benefits of the procedure  and gave full informed consent.   DESCRIPTION OF PROCEDURE:  After successful induction of general anesthesia,  the patient was placed in the dorsal lithotomy position, prepped with  Betadine and draped in the usual sterile fashion.  Cystoscopy was performed,  and the urethra was visualized in its entirety and was found to be normal  beyond the  verumontanum.  Prostate was not particularly enlarged and  nonobstructing.  The bladder was inspected.  No tumors or stones could be  seen.  Both ureteral orifices were normal in configuration and location.  A  retrograde study performed on the right-hand side showed what appeared to be  either a stone or a stricture in the mid ureter.  Dr. Vernie Ammons happened to be  on the OR, and he agreed that this area did appear to be abnormal.  A  guidewire was passed up to the kidney and allowed to coil in the upper pole.  The ureteroscope was inserted, and the distal ureter was easily visualized,  but it could not be advanced beyond the level of the iliac vessels due to  tightening.  The ureteroscope was removed.  An attempt to pass the ureteral  access sheath  as a dilator past that point was unsuccessful.  For that  reason, a balloon dilator was passed, and the entire ureter in that area was  dilated.  The access sheath was tried again and still would not pass.  The  flexible ureteroscope was inserted, and it could be passed beyond that  point, and it did appear that the strictured area had been opened, but there  was concern on my part that the wire may have gotten into a false passage,  as it pull out of the kidney as the flexible ureteroscope was withdrawn.  For that reason, the rigid ureteroscope was inserted.  Fortunately, after  the balloon dilation, the ureteroscope could be advanced and using a  guidewire, the ureteroscope was advanced all the way into the kidney.  Intraoperative pyeloscopy was performed, and the pelvis itself showed some  trauma from the wires but otherwise was normal.  The collecting system could  be visualized.  It was somewhat dilated from the previous obstruction.  With  the guidewire firmly held up in the upper pole, the ureteroscope was  withdrawn, and the entire ureter was inspected.  The mid ureter certainly  showed evidence of the balloon dilation, and the strictured  area was now  wide open.  Careful inspection showed no stone material either inside the  ureter or in the intramural area, and it appeared that the ureter was wide  open.  The ureteroscope was then withdrawn; the fluoroscope was used to make  sure that the guidewire was still in place.  This was backloaded over the  cystoscope, and the 7 French x 26 cm double-J catheter was advanced.  This  was pushed all the way up to the kidney and allowed to coil within the upper  pole and intrarenal pelvis, and there was not much room in the pelvis per  se.  This coil also noted within the bladder.  The patient's bladder was  drained.  He tolerated the procedure well and was taken to the recovery room  in good condition.  He will be sent home with Percocet as well as Pyridium  Plus to take as needed for pain or spasms, and he will be maintained on  Septra DS 1 daily.                                               Jamison Neighbor, M.D.    RJE/MEDQ  D:  12/01/2003  T:  12/01/2003  Job:  [id number]

## 2010-11-29 ENCOUNTER — Telehealth: Payer: Self-pay

## 2010-11-29 ENCOUNTER — Ambulatory Visit (INDEPENDENT_AMBULATORY_CARE_PROVIDER_SITE_OTHER): Payer: BC Managed Care – PPO | Admitting: Internal Medicine

## 2010-11-29 ENCOUNTER — Encounter: Payer: Self-pay | Admitting: Internal Medicine

## 2010-11-29 DIAGNOSIS — R300 Dysuria: Secondary | ICD-10-CM

## 2010-11-29 DIAGNOSIS — J40 Bronchitis, not specified as acute or chronic: Secondary | ICD-10-CM | POA: Insufficient documentation

## 2010-11-29 DIAGNOSIS — K625 Hemorrhage of anus and rectum: Secondary | ICD-10-CM

## 2010-11-29 HISTORY — DX: Dysuria: R30.0

## 2010-11-29 LAB — POCT URINALYSIS DIPSTICK
Bilirubin, UA: NEGATIVE
Blood, UA: NEGATIVE
Glucose, UA: NEGATIVE
Leukocytes, UA: NEGATIVE
Nitrite, UA: NEGATIVE
Protein, UA: NEGATIVE
Spec Grav, UA: 1.025
Urobilinogen, UA: 0.2
pH, UA: 6

## 2010-11-29 MED ORDER — LEVOFLOXACIN 500 MG PO TABS: 500.0000 mg | ORAL_TABLET | Freq: Every day | ORAL | Status: AC

## 2010-11-29 NOTE — Assessment & Plan Note (Signed)
Consider possible prostatitis. Obtain urinalysis and culture. Begin Levaquin 500 mg a day x10 days.Followup if no improvement or worsening.

## 2010-11-29 NOTE — Assessment & Plan Note (Signed)
Chronic intermittent symptoms. Schedule followup appointment with [facility] in [place] who saw him previously.

## 2010-11-29 NOTE — Assessment & Plan Note (Signed)
Antibiotic course as above. Followup if no improvement or worsening.

## 2010-11-29 NOTE — Telephone Encounter (Signed)
Pt would like to discuss changing depression meds. He feels like his current med is no longer effective

## 2010-11-29 NOTE — Progress Notes (Signed)
  Subjective:    Patient ID: Chase [name], male    DOB: 1961-05-25, 50 y.o.   MRN: [id number]  HPI Pt presents to clinic for evaluation of dysuria and cough. Notes 3 week history of earache, sore throat, nasal congestion and cough productive intermittently for quite yellow sputum. No shortness of breath or wheezing and no alleviating or exacerbating factors. Also notes approximately one week history of dysuria with suprapubic discomfort. No definite associated back pain and denies nausea vomiting fever or chills. Has not noted hematuria. Has history of chronic intermittent rectal bleeding with up-to-date EGD and colonoscopy within the past year. Continues to note intermittent bright red blood per rectum. Was unable to be seen by  GI and has not returned to previous gastroenterologist in [place]. No current gross active bleeding. No other complaints  Reviewed past medical history, medications and allergies.  Review of Systems see history of present illness     Objective:   Physical Exam  Nursing note and vitals reviewed. Constitutional: He appears well-developed and well-nourished. No distress.  HENT:  Head: Normocephalic and atraumatic.  Right Ear: External ear normal.  Left Ear: External ear normal.  Nose: Nose normal.  Mouth/Throat: Oropharynx is clear and moist. No oropharyngeal exudate.  Eyes: Conjunctivae are normal. Scleral icterus is present.  Neck: Neck supple.  Cardiovascular: Normal rate, regular rhythm and normal heart sounds.  Exam reveals no gallop and no friction rub.   No murmur heard. Pulmonary/Chest: Effort normal and breath sounds normal. No respiratory distress. He has no wheezes. He has no rales.  Abdominal: Soft. Bowel sounds are normal. He exhibits no distension and no mass. There is no hepatosplenomegaly. There is tenderness in the suprapubic area. There is no rebound and no guarding.  Lymphadenopathy:    He has no cervical adenopathy.  Neurological:  He is alert.  Skin: Skin is warm and dry. He is not diaphoretic.  Psychiatric: He has a normal mood and affect.          Assessment & Plan:

## 2010-12-01 LAB — URINE CULTURE
Colony Count: NO GROWTH
Organism ID, Bacteria: NO GROWTH

## 2010-12-04 NOTE — Telephone Encounter (Signed)
To increase imipramine to 75mg  qd

## 2010-12-04 NOTE — Telephone Encounter (Signed)
Increase imipramine to 75mg  qd

## 2010-12-05 ENCOUNTER — Telehealth: Payer: Self-pay | Admitting: Family Medicine

## 2010-12-05 NOTE — Telephone Encounter (Signed)
Requesting urinalysis report from last week. Going to see his Gi appt today @ 2pm.

## 2010-12-25 ENCOUNTER — Ambulatory Visit (INDEPENDENT_AMBULATORY_CARE_PROVIDER_SITE_OTHER): Payer: BC Managed Care – PPO | Admitting: Internal Medicine

## 2010-12-25 ENCOUNTER — Encounter: Payer: Self-pay | Admitting: Internal Medicine

## 2010-12-25 VITALS — BP 130/90 | Temp 98.2°F | Wt 192.0 lb

## 2010-12-25 DIAGNOSIS — L259 Unspecified contact dermatitis, unspecified cause: Secondary | ICD-10-CM

## 2010-12-25 DIAGNOSIS — L309 Dermatitis, unspecified: Secondary | ICD-10-CM

## 2010-12-25 MED ORDER — TRIAMCINOLONE ACETONIDE 0.1 % EX CREA: TOPICAL_CREAM | Freq: Two times a day (BID) | CUTANEOUS | Status: DC

## 2010-12-25 NOTE — Patient Instructions (Signed)
Apply cream twice daily  Call or return to clinic prn if these symptoms worsen or fail to improve as anticipated.

## 2010-12-25 NOTE — Progress Notes (Signed)
  Subjective:    Patient ID: Chase [name], male    DOB: [birth year], 50 y.o.   MRN: [id number]  HPI 50 year old patient who presents with a two-week history of a pruritic rash involving his anterior lower legs. He has been using topical antibiotics without much benefit. He states that he does have outdoor exposure but usually wears long pants. He has been using hydroxyzine for itching at bedtime with benefit.   Review of Systems  Skin: Positive for rash.       Objective:   Physical Exam  Constitutional: He appears well-developed and well-nourished. No distress.       Blood pressure 130/90. Temperature 98.2  Skin:       Scattered erythematous slightly raised papular lesions noted on the anterior surface surface of both lower legs the right greater than the left. Also some lesions were noted involving the right posterior calf region. These are not inflamed          Assessment & Plan:  Nonspecific dermatitis. Probably contact dermatitis. We'll treat with triamcinolone 0.1% cream twice daily. He will call if unimproved

## 2010-12-27 LAB — HM SIGMOIDOSCOPY

## 2010-12-30 ENCOUNTER — Telehealth: Payer: Self-pay | Admitting: Family Medicine

## 2010-12-30 NOTE — Telephone Encounter (Signed)
Pt called and said that he passed 2 kidney stones on Thurs. Pt has sch ov on 01/02/11 with Dr Scotty Court. Pt is req to come in sooner. Pls advise.

## 2010-12-31 NOTE — Telephone Encounter (Signed)
Spoke with pt and he states he is not in as much as he was before he passed the stones.  Pt stated he kept the stones so that they can be tested.  Pt will come in on July 18,2012 at 9:30

## 2011-01-01 ENCOUNTER — Ambulatory Visit (INDEPENDENT_AMBULATORY_CARE_PROVIDER_SITE_OTHER): Payer: BC Managed Care – PPO | Admitting: Family Medicine

## 2011-01-01 ENCOUNTER — Encounter: Payer: Self-pay | Admitting: Family Medicine

## 2011-01-01 VITALS — BP 142/94 | HR 99 | Temp 98.3°F | Wt 191.0 lb

## 2011-01-01 DIAGNOSIS — N419 Inflammatory disease of prostate, unspecified: Secondary | ICD-10-CM

## 2011-01-01 DIAGNOSIS — R109 Unspecified abdominal pain: Secondary | ICD-10-CM

## 2011-01-01 DIAGNOSIS — N200 Calculus of kidney: Secondary | ICD-10-CM

## 2011-01-01 DIAGNOSIS — L259 Unspecified contact dermatitis, unspecified cause: Secondary | ICD-10-CM

## 2011-01-01 DIAGNOSIS — L309 Dermatitis, unspecified: Secondary | ICD-10-CM

## 2011-01-01 DIAGNOSIS — K644 Residual hemorrhoidal skin tags: Secondary | ICD-10-CM

## 2011-01-01 LAB — POCT URINALYSIS DIPSTICK
Bilirubin, UA: NEGATIVE
Blood, UA: NEGATIVE
Glucose, UA: NEGATIVE
Ketones, UA: NEGATIVE
Leukocytes, UA: NEGATIVE
Nitrite, UA: NEGATIVE
Protein, UA: NEGATIVE
Spec Grav, UA: 1.02
Urobilinogen, UA: 0.2
pH, UA: 7

## 2011-01-01 MED ORDER — FLUTICASONE PROPIONATE 50 MCG/ACT NA SUSP: 2.0000 | Freq: Every day | NASAL | Status: DC

## 2011-01-01 MED ORDER — OMEPRAZOLE 40 MG PO CPDR: 40.0000 mg | DELAYED_RELEASE_CAPSULE | Freq: Every day | ORAL | Status: DC

## 2011-01-01 MED ORDER — LEVOFLOXACIN 500 MG PO TABS: 500.0000 mg | ORAL_TABLET | Freq: Every day | ORAL | Status: AC

## 2011-01-01 MED ORDER — LEVOFLOXACIN 500 MG PO TABS: 500.0000 mg | ORAL_TABLET | Freq: Every day | ORAL | Status: DC

## 2011-01-01 MED ORDER — HYDROXYZINE HCL 25 MG PO TABS: 25.0000 mg | ORAL_TABLET | Freq: Every day | ORAL | Status: DC

## 2011-01-01 MED ORDER — IMIPRAMINE HCL 25 MG PO TABS: ORAL_TABLET | ORAL | Status: DC

## 2011-01-01 NOTE — Patient Instructions (Signed)
For the nonspecific dermatitis of the right lower leg apply either the triamcinolone or cortisone 10 twice daily for at least 10-12 days You have acute prostatitis rather severe which you're aware due to the marked pain, mild prescribe Levaquin 500 mg each day for 15 days then if it appears to be continuing than repeat the medication If the problem persists he should be reexamined. Regarding the renal stones I am going to send them off for analysis, if the pain continues and will need to do an ultrasound or CT scan of the kidneys. It is important that you have an extraordinary fluid intake to stay well hydrated and keep the urine more dilute than the present specimen

## 2011-01-02 ENCOUNTER — Ambulatory Visit: Payer: BC Managed Care – PPO | Admitting: Family Medicine

## 2011-01-06 LAB — STONE ANALYSIS: Stone Weight KSTONE: 0.027 g

## 2011-01-09 ENCOUNTER — Ambulatory Visit: Payer: BC Managed Care – PPO | Admitting: Family Medicine

## 2011-01-10 ENCOUNTER — Telehealth: Payer: Self-pay

## 2011-01-10 NOTE — Telephone Encounter (Signed)
Pt called and stated he would like the results of from his kidney stones.

## 2011-01-10 NOTE — Telephone Encounter (Signed)
Called pt to make aware that lab results have not been reviewed by Dr.Stafford, and when it has I will call with results.

## 2011-01-13 ENCOUNTER — Telehealth: Payer: Self-pay

## 2011-01-13 MED ORDER — ALLOPURINOL 100 MG PO TABS: 100.0000 mg | ORAL_TABLET | Freq: Every day | ORAL | Status: DC

## 2011-01-13 NOTE — Telephone Encounter (Signed)
Pt would like to know if he needs to decrease his vitamin water.  Pt states he drinks 1 to 3 with an average of 2 a day.  Pt would also like to know  If his leg and muscle pain could be associated.  Pls advise.

## 2011-01-13 NOTE — Progress Notes (Signed)
Pt aware.  Rx has been sent in to pharmacy.

## 2011-01-17 ENCOUNTER — Encounter: Payer: Self-pay | Admitting: Family Medicine

## 2011-01-17 NOTE — Telephone Encounter (Signed)
Called to make pt aware that that per Dr. Scotty Court there is no association between between pt's concerns about vitamin water and leg pain.

## 2011-01-28 ENCOUNTER — Other Ambulatory Visit: Payer: Self-pay

## 2011-01-28 ENCOUNTER — Other Ambulatory Visit: Payer: Self-pay | Admitting: Family Medicine

## 2011-01-28 ENCOUNTER — Other Ambulatory Visit (INDEPENDENT_AMBULATORY_CARE_PROVIDER_SITE_OTHER): Payer: BC Managed Care – PPO

## 2011-01-28 DIAGNOSIS — M109 Gout, unspecified: Secondary | ICD-10-CM

## 2011-01-28 DIAGNOSIS — F419 Anxiety disorder, unspecified: Secondary | ICD-10-CM

## 2011-01-28 LAB — URIC ACID: Uric Acid, Serum: 4.6 mg/dL (ref 4.0–7.8)

## 2011-01-28 MED ORDER — DIAZEPAM 5 MG PO TABS: 5.0000 mg | ORAL_TABLET | Freq: Three times a day (TID) | ORAL | Status: DC

## 2011-01-29 ENCOUNTER — Other Ambulatory Visit: Payer: Self-pay | Admitting: Family Medicine

## 2011-01-30 NOTE — Progress Notes (Signed)
Pt aware.

## 2011-02-17 ENCOUNTER — Encounter: Payer: Self-pay | Admitting: Family Medicine

## 2011-02-17 NOTE — Progress Notes (Signed)
  Subjective:    Patient ID: Chase [name], male    DOB: Oct 21, 1960, 50 y.o.   MRN: [id number] This 50 year old white single male is in today relating his past is known and brought the stone to have it analyzed. Had some flank pain left a urinalysis today was negative and send stone to the lab analysis Weight she is also have an problem with an extra [place] which yard as treatment Complaining of nasal congestion and would like refill on Flonase also refill on Atarax Tofranill and Prilosec. HPI    Review of Systems no other symptoms on system review see present illness     Objective:   Physical Exam contamination reveals a quiet male in no distress Marked tenderness on palpation leftc VA region Rectal examination reveals swollen tender prostate boggy Extremities nonspecific erythematous rash lower extremities blow knee        Assessment & Plan:  Nephrolithiasis have. stone analyzed, no leukocytes no hematuria in urine Refill medications for allergic rhinitis GERD Gretchen chronic dizziness External hemorrhoids treated with previous prescription of Anusol H.  Nonspecific rash lower will leg below the knee treated with hydrocortisone Prostatitis acute treat with Levaquin 500 mg daily for 14 days

## 2011-05-05 ENCOUNTER — Other Ambulatory Visit: Payer: Self-pay | Admitting: Family Medicine

## 2011-05-22 ENCOUNTER — Encounter: Payer: Self-pay | Admitting: Internal Medicine

## 2011-05-22 ENCOUNTER — Other Ambulatory Visit: Payer: Self-pay | Admitting: Internal Medicine

## 2011-05-22 ENCOUNTER — Ambulatory Visit (INDEPENDENT_AMBULATORY_CARE_PROVIDER_SITE_OTHER): Payer: BC Managed Care – PPO | Admitting: Internal Medicine

## 2011-05-22 VITALS — BP 138/90 | HR 82 | Temp 98.2°F | Wt 188.8 lb

## 2011-05-22 DIAGNOSIS — K649 Unspecified hemorrhoids: Secondary | ICD-10-CM

## 2011-05-22 HISTORY — DX: Unspecified hemorrhoids: K64.9

## 2011-05-22 NOTE — Assessment & Plan Note (Addendum)
Sx c/w thrombosed internal hemorrhoid Plan: Cont anamantel, will call a RF ER if sx severe Refer to same day  Surgery clinic , incision?? Pt's Cell [phone]

## 2011-05-22 NOTE — Patient Instructions (Signed)
Bath sitz Proctofoam ER if severe bleeding See surgery

## 2011-05-22 NOTE — Progress Notes (Signed)
  Subjective:    Patient ID: MCKAY TEGTMEYER, male    DOB: Jul 28, 1960, 50 y.o.   MRN: [id number]  HPI Acute visit : Long history of hemorrhoids problems ; he reports that a long time ago he had a local injection to help with internal hemorrhoids. More recently had a colonoscopy and a  sigmoidoscopy 12/26/2010 done in [place]--->  he brought the report, it showed internal and external hemorrhoids.  He has been doing relatively well, was prescribed ProctoFoam but eventually switched to anamantel due to cost. Yesterday after a bowel movement he noticed swelling in the rectum along with pain and bleeding. He noted "something in there"  and was able to push it in. The blood was fresh and red, also some dark blood (in the past he also had dark blood and that is why a  sigmoidoscopy was done a few months ago)  Problem list reviewed Med list reviewed   Review of Systems No nausea, vomiting, diarrhea. No abdominal pain    Objective:   Physical Exam  Constitutional: He appears well-developed and well-nourished.  HENT:  Head: Normocephalic and atraumatic.  Abdominal: Soft. He exhibits no distension. There is no tenderness. There is no rebound and no guarding.  Genitourinary:       Few external hemorrhoids without swelling or bleeding. at 3 oclock  he has a 2 cm internal hemorrhoid that protrudes, is tender, reducible.      Assessment & Plan:

## 2011-05-23 ENCOUNTER — Other Ambulatory Visit: Payer: Self-pay | Admitting: Family Medicine

## 2011-05-23 ENCOUNTER — Encounter (INDEPENDENT_AMBULATORY_CARE_PROVIDER_SITE_OTHER): Payer: Self-pay | Admitting: Surgery

## 2011-05-23 ENCOUNTER — Ambulatory Visit (INDEPENDENT_AMBULATORY_CARE_PROVIDER_SITE_OTHER): Payer: BC Managed Care – PPO | Admitting: Surgery

## 2011-05-23 VITALS — BP 152/98 | HR 76 | Temp 97.6°F | Resp 16 | Ht 71.0 in | Wt 187.2 lb

## 2011-05-23 DIAGNOSIS — K645 Perianal venous thrombosis: Secondary | ICD-10-CM

## 2011-05-23 DIAGNOSIS — R39198 Other difficulties with micturition: Secondary | ICD-10-CM | POA: Insufficient documentation

## 2011-05-23 NOTE — Patient Instructions (Signed)
Daily fiber supplement - Metamucil, citracel Stool softeners as needed Use anamantel or proctofoam topically as needed Frequent sitz baths - warm water before and after bowel movements.

## 2011-05-23 NOTE — Progress Notes (Signed)
Patient ID: Chase [name], male   DOB: [birth year], 50 y.o.   MRN: [id number]  Chief Complaint  Patient presents with  . Follow-up    Eval thrombosed hemorrhoids    Chase [name] is a 50 y.o. male.  Referred by Dr. Drue Novel for evaluation of hemorrhoids Chase The patient has had a history of problems with hemorrhoids for over a year. He has some chronic constipation and frequently has to strain with bowel movements. He underwent a colonoscopy earlier this summer which showed only hemorrhoid disease. Several days ago he did have some constipation. Subsequently developed a very painful swelling in the anterior left location. He has noticed occasional bleeding. The pain was at its worst 2 days ago but has improved slightly. He has been using ProctoFoam and Anamantle with minimal success. Past Medical History  Diagnosis Date  . Allergy   . Stroke   . Sleep apnea   . Headache   . Arthritis   . GERD (gastroesophageal reflux disease)   . Hypertension   . Hyperlipidemia   . Hearing loss   . Nasal congestion   . Trouble swallowing   . Blood in stool   . Chest pain   . Leg swelling   . Constipation   . Rectal pain   . Difficulty urinating   . Thrombosed hemorrhoids     Past Surgical History  Procedure Date  . Cataract extraction     x 3  . Inner ear surgery     rt ear  . Eye surgery 1968, 1985, 1987    cataracts  . Inner ear surgery 1992  . Surgery - right arm     1983    Family History  Problem Relation Age of Onset  . Cancer Mother 48    breast  . Stroke Father   . Heart disease Father     Social History History  Substance Use Topics  . Smoking status: Never Smoker   . Smokeless tobacco: Never Used  . Alcohol Use: No    Allergies  Allergen Reactions  . Amoxicillin Hives, Itching and Other (See Comments)    White tongue  . Terfenadine     Current Outpatient Prescriptions  Medication Sig Dispense Refill  . Phenylephrine-APAP-Guaifenesin (MUCINEX FAST-MAX COLD  & SINUS PO) Take by mouth as needed.        Marland Kitchen allopurinol (ZYLOPRIM) 100 MG tablet Take 1 tablet (100 mg total) by mouth daily.  30 tablet  5  . diazepam (VALIUM) 5 MG tablet Take 1 tablet (5 mg total) by mouth 3 (three) times daily.  90 tablet  5  . HYDROcodone-acetaminophen (LORTAB) 7.5-500 MG/15ML solution Take 15 mLs by mouth 4 (four) times daily as needed.        . hydrocortisone (ANUSOL-HC) 25 MG suppository Place 25 mg rectally 2 (two) times daily.        . hydrocortisone-pramoxine (ANALPRAM-HC) 2.5-1 % rectal cream INSERT 1 APPLICATOR TWO TIMES A DAY  120 g  0  . hydrOXYzine (ATARAX) 25 MG tablet Take 1 tablet (25 mg total) by mouth at bedtime.  30 tablet  11  . imipramine (TOFRANIL) 25 MG tablet 3 tabs at  hs  75 tablet  11  . metoprolol (TOPROL-XL) 25 MG 24 hr tablet Take 25 mg by mouth daily.        . naproxen (NAPROSYN) 125 MG/5ML suspension TAKE 4 TEASPOONFUL IN THE MORNING AND IN THE EVENING  1200 mL  0  .  nitroGLYCERIN (NITROSTAT) 0.4 MG SL tablet Place 0.4 mg under the tongue every 5 (five) minutes as needed.        Marland Kitchen omeprazole (PRILOSEC) 40 MG capsule Take 1 capsule (40 mg total) by mouth daily.  30 capsule  11  . Probiotic Product (ALIGN) 4 MG CAPS Take by mouth.          Review of Systems Review of Systems  Blood pressure 152/98, pulse 76, temperature 97.6 F (36.4 C), temperature source Temporal, resp. rate 16, height 5\' 11"  (1.803 m), weight 187 lb 3.2 oz (84.913 kg).  Physical Exam Physical Exam WDWN in NAD Rectal - no abscess or fistula noted;  No anal fissures noted He has some loose external hemorrhoidal skin In the anterior left location, he has some obvious thrombosis measuring about 1 cm in diameter.  Currently, this area is moderately uncomfortable, but he states that this is better than 2 days ago.   Data Reviewed None  Assessment    Thrombosed hemorrhoid - resolving Chronic constipation    Plan    Sitz baths frequently - before and after bowel  movements Daily fiber supplement to help regulate bowel movements Stool softeners as needed if bowel movements are too firm. Follow-up PRN       Huston Stonehocker K. 05/23/2011, 4:52 PM

## 2011-05-28 ENCOUNTER — Other Ambulatory Visit: Payer: Self-pay | Admitting: Internal Medicine

## 2011-05-29 NOTE — Telephone Encounter (Signed)
Duplicate; resent

## 2011-06-03 ENCOUNTER — Other Ambulatory Visit: Payer: Self-pay | Admitting: Internal Medicine

## 2011-06-12 NOTE — Telephone Encounter (Signed)
Rx prescribed by Dr. Scotty Court. Please advise on refills. Thank You

## 2011-06-15 ENCOUNTER — Encounter (HOSPITAL_COMMUNITY): Payer: Self-pay | Admitting: *Deleted

## 2011-06-15 ENCOUNTER — Emergency Department (HOSPITAL_COMMUNITY)
Admission: EM | Admit: 2011-06-15 | Discharge: 2011-06-15 | Disposition: A | Discharge status: Home / Self Care | Payer: BC Managed Care – PPO | Attending: Emergency Medicine | Admitting: Emergency Medicine

## 2011-06-15 DIAGNOSIS — Z8673 Personal history of transient ischemic attack (TIA), and cerebral infarction without residual deficits: Secondary | ICD-10-CM | POA: Insufficient documentation

## 2011-06-15 DIAGNOSIS — R109 Unspecified abdominal pain: Secondary | ICD-10-CM | POA: Insufficient documentation

## 2011-06-15 DIAGNOSIS — R141 Gas pain: Secondary | ICD-10-CM | POA: Insufficient documentation

## 2011-06-15 DIAGNOSIS — K5900 Constipation, unspecified: Secondary | ICD-10-CM | POA: Insufficient documentation

## 2011-06-15 DIAGNOSIS — K644 Residual hemorrhoidal skin tags: Secondary | ICD-10-CM | POA: Insufficient documentation

## 2011-06-15 DIAGNOSIS — R143 Flatulence: Secondary | ICD-10-CM | POA: Insufficient documentation

## 2011-06-15 DIAGNOSIS — I10 Essential (primary) hypertension: Secondary | ICD-10-CM | POA: Insufficient documentation

## 2011-06-15 DIAGNOSIS — F411 Generalized anxiety disorder: Secondary | ICD-10-CM | POA: Insufficient documentation

## 2011-06-15 DIAGNOSIS — R142 Eructation: Secondary | ICD-10-CM | POA: Insufficient documentation

## 2011-06-15 DIAGNOSIS — M129 Arthropathy, unspecified: Secondary | ICD-10-CM | POA: Insufficient documentation

## 2011-06-15 DIAGNOSIS — Z79899 Other long term (current) drug therapy: Secondary | ICD-10-CM | POA: Insufficient documentation

## 2011-06-15 DIAGNOSIS — E785 Hyperlipidemia, unspecified: Secondary | ICD-10-CM | POA: Insufficient documentation

## 2011-06-15 DIAGNOSIS — K6289 Other specified diseases of anus and rectum: Secondary | ICD-10-CM | POA: Insufficient documentation

## 2011-06-15 MED ORDER — MAGNESIUM CITRATE PO SOLN
0.5000 | Freq: Once | ORAL | Status: AC
  Administered 2011-06-15: 0.5 via ORAL
  Filled 2011-06-15: qty 296

## 2011-06-15 MED ORDER — POLYETHYLENE GLYCOL 3350 17 GM/SCOOP PO POWD: 17.0000 g | Freq: Every day | ORAL | Status: AC

## 2011-06-15 MED ORDER — FLEET ENEMA 7-19 GM/118ML RE ENEM
1.0000 | ENEMA | Freq: Once | RECTAL | Status: AC
  Administered 2011-06-15: 1 via RECTAL
  Filled 2011-06-15: qty 1

## 2011-06-15 NOTE — ED Provider Notes (Signed)
History     CSN: [id number]  Arrival date & time 06/15/11  1122   First MD Initiated Contact with Patient 06/15/11 1139      Chief Complaint  Patient presents with  . Constipation    (Consider location/radiation/quality/duration/timing/severity/associated sxs/prior treatment) HPI Comments: Patient with chronic constipation and history of both internal and external hemorrhoids.  He reports that he does not take any daily medications for constipation.  He has taken Miralax in the past, which has helped.   He reports that his last BM was either yesterday or 2 days ago.  He reports that the bowel movement was normal in size and consistency.  This morning he felt constipated and had pain when he attempted to have a  bowel movement.  He reports that he noticed a small amount of bright red blood.  He inserted his finger into his rectum and reports that he could feel fecal impaction.  He disimpacted himself as much as he could.  He had a colonoscopy last year and also had a sigmoidoscopy 12-26-10 which showed both internal and external hemorrhoids.  No prior history of abdominal surgeries.  Patient is a 50 y.o. male presenting with constipation. The history is provided by the patient.  Constipation  Associated symptoms include abdominal pain. Pertinent negatives include no fever, no nausea, no vomiting and no chest pain.    Past Medical History  Diagnosis Date  . Allergy   . Stroke   . Sleep apnea   . Headache   . Arthritis   . GERD (gastroesophageal reflux disease)   . Hypertension   . Hyperlipidemia   . Hearing loss   . Nasal congestion   . Trouble swallowing   . Blood in stool   . Chest pain   . Leg swelling   . Constipation   . Rectal pain   . Difficulty urinating   . Thrombosed hemorrhoids     Past Surgical History  Procedure Date  . Cataract extraction     x 3  . Inner ear surgery     rt ear  . Eye surgery 1968, 1985, 1987    cataracts  . Inner ear surgery 1992  .  Surgery - right arm     1983    Family History  Problem Relation Age of Onset  . Cancer Mother 45    breast  . Stroke Father   . Heart disease Father     History  Substance Use Topics  . Smoking status: Never Smoker   . Smokeless tobacco: Never Used  . Alcohol Use: No      Review of Systems  Constitutional: Negative for fever and chills.  Respiratory: Negative for chest tightness and shortness of breath.   Cardiovascular: Negative for chest pain.  Gastrointestinal: Positive for abdominal pain, constipation and abdominal distention. Negative for nausea and vomiting.  Genitourinary: Negative for dysuria.    Allergies  Amoxicillin and Terfenadine  Home Medications   Current Outpatient Rx  Name Route Sig Dispense Refill  . ALLOPURINOL 100 MG PO TABS Oral Take 1 tablet (100 mg total) by mouth daily. 30 tablet 5  . DIAZEPAM 5 MG PO TABS Oral Take 1 tablet (5 mg total) by mouth 3 (three) times daily. 90 tablet 5  . HYDROCORTISONE ACE-PRAMOXINE 2.5-1 % RE CREA  INSERT 1 APPLICATOR TWO TIMES A DAY 120 g 0  . HYDROXYZINE HCL 25 MG PO TABS Oral Take 1 tablet (25 mg total) by mouth at bedtime. 30  tablet 11  . IMIPRAMINE HCL 25 MG PO TABS  3 tabs at  hs 75 tablet 11  . METOPROLOL TARTRATE 25 MG PO TABS  TAKE 1 TABLET TWICE A DAY, MAY CRUSH IN FOOD 60 tablet 2  . NAPROXEN 125 MG/5ML PO SUSP  TAKE 4 TEASPOONFUL IN THE MORNING AND IN THE EVENING 1200 mL 0  . MUCINEX FAST-MAX COLD & SINUS PO Oral Take by mouth as needed.      Marland Kitchen FLUTICASONE PROPIONATE 50 MCG/ACT NA SUSP Nasal Place 2 sprays into the nose daily.      Marland Kitchen HYDROCODONE-ACETAMINOPHEN 7.5-500 MG/15ML PO SOLN Oral Take 15 mLs by mouth 4 (four) times daily as needed.      Marland Kitchen HYDROCORTISONE ACETATE 25 MG RE SUPP Rectal Place 25 mg rectally 2 (two) times daily.      Marland Kitchen NITROGLYCERIN 0.4 MG SL SUBL Sublingual Place 0.4 mg under the tongue every 5 (five) minutes as needed.      Marland Kitchen OMEPRAZOLE 40 MG PO CPDR Oral Take 1 capsule (40 mg  total) by mouth daily. 30 capsule 11  . POLYETHYLENE GLYCOL 3350 PO POWD Oral Take 17 g by mouth daily. 255 g 0  . ALIGN 4 MG PO CAPS Oral Take by mouth.        BP 178/105  Pulse 111  Temp(Src) 98.9 F (37.2 C) (Oral)  Resp 19  SpO2 100%  Physical Exam  Nursing note and vitals reviewed. Constitutional: He is oriented to person, place, and time. He appears well-developed and well-nourished. No distress.  Cardiovascular: Normal rate, regular rhythm and normal heart sounds.   Pulmonary/Chest: Effort normal and breath sounds normal. No respiratory distress.  Abdominal: He exhibits distension. He exhibits no mass. There is no rebound and no guarding.  Genitourinary: Rectal exam shows external hemorrhoid and tenderness. Rectal exam shows no fissure and no mass.       Soft external hemorrhoids present.  No thrombosed hemorrhoids.  Small amount of bright red blood on the external buttocks.  Hard fecal material palpated in the rectum.  Fecal material present beyond what could be felt with finger.    Neurological: He is alert and oriented to person, place, and time.  Skin: Skin is warm and dry. He is not diaphoretic.  Psychiatric: His mood appears anxious.    ED Course  Procedures (including critical care time)  Labs Reviewed - No data to display No results found.   1. Constipation      11:11 PM Patient given a fleet enema.  He had a very large bowel movement and abdominal pain had improved.  Will discharge patient home with Rx for Miralax.  Patient instructed to take daily.  Patient instructed to follow up with PCP. MDM  Patient with chronic constipation currently not on any daily medication for constipation.  Patient very anxious about his constipation.  Patient was given a fleet enema in the ED which produced a very large BM.  Instructed patient to take daily fiber, use Miralax daily, and drink plenty of fluids.  Patient also instructed to follow up with PCP.        Pascal Lux Wingen 06/15/11 2321

## 2011-06-15 NOTE — ED Notes (Signed)
Pt reports "fecal impaction." Had a BM either yesterday or Friday, today reports unable to have BM tried to manually remove, with little relief. C/o rectal and abd pain. Also reports some bleeding r/t hemorrhoid.

## 2011-06-15 NOTE — ED Notes (Signed)
Patient given discharge instructions, information, prescriptions, and diet order. Patient states that they adequately understand discharge information given and to return to ED if symptoms return or worsen.     

## 2011-06-15 NOTE — ED Notes (Signed)
Pt in restroom 

## 2011-06-16 ENCOUNTER — Other Ambulatory Visit: Payer: Self-pay | Admitting: Internal Medicine

## 2011-06-16 DIAGNOSIS — F419 Anxiety disorder, unspecified: Secondary | ICD-10-CM

## 2011-06-16 LAB — OCCULT BLOOD, POC DEVICE: Fecal Occult Bld: POSITIVE

## 2011-06-16 MED ORDER — NAPROXEN 125 MG/5ML PO SUSP: 500.0000 mg | Freq: Two times a day (BID) | ORAL | Status: DC

## 2011-06-16 MED ORDER — IMIPRAMINE HCL 25 MG PO TABS: ORAL_TABLET | ORAL | Status: DC

## 2011-06-16 MED ORDER — RANITIDINE HCL 15 MG/ML PO SYRP: 2.0000 mg/kg/d | ORAL_SOLUTION | Freq: Two times a day (BID) | ORAL | Status: DC

## 2011-06-16 MED ORDER — ALLOPURINOL 100 MG PO TABS: 100.0000 mg | ORAL_TABLET | Freq: Every day | ORAL | Status: DC

## 2011-06-16 MED ORDER — DIAZEPAM 5 MG PO TABS: 5.0000 mg | ORAL_TABLET | Freq: Three times a day (TID) | ORAL | Status: DC

## 2011-06-16 NOTE — Telephone Encounter (Signed)
Discussed with patient and he voiced understanding    KP 

## 2011-06-16 NOTE — Telephone Encounter (Signed)
Former patient of Dr. Scotty Court, I have seen him once acutely. Okay to refill one-month supply of medicines, for further RF needs a OV to "get establish"

## 2011-06-16 NOTE — Telephone Encounter (Signed)
Patient needs refill ranitidine - allopurinol - - diazapam  - imipramine hcl - naproxin -[facility] [address] he said pharmacy told him they faxed request

## 2011-06-16 NOTE — Telephone Encounter (Signed)
Patient Est care 05/22/11. Please advise if it is ok to fill these medications.    KP

## 2011-06-17 NOTE — ED Provider Notes (Signed)
Medical screening examination/treatment/procedure(s) were performed by non-physician practitioner and as supervising physician I was immediately available for consultation/collaboration.  [name], MD 06/17/11 [phone]

## 2011-06-18 ENCOUNTER — Other Ambulatory Visit: Payer: Self-pay | Admitting: Internal Medicine

## 2011-06-18 MED ORDER — RANITIDINE HCL 15 MG/ML PO SYRP: 2.0000 mg/kg/d | ORAL_SOLUTION | Freq: Two times a day (BID) | ORAL | Status: DC

## 2011-06-18 MED ORDER — IMIPRAMINE HCL 25 MG PO TABS: ORAL_TABLET | ORAL | Status: DC

## 2011-06-18 NOTE — Telephone Encounter (Signed)
Rx sent on 06-16-11, Rx resent to pharmacy.

## 2011-08-05 ENCOUNTER — Encounter: Payer: Self-pay | Admitting: Internal Medicine

## 2011-08-05 ENCOUNTER — Ambulatory Visit (INDEPENDENT_AMBULATORY_CARE_PROVIDER_SITE_OTHER): Payer: BC Managed Care – PPO | Admitting: Internal Medicine

## 2011-08-05 VITALS — BP 158/94 | HR 123 | Temp 98.5°F | Wt 192.0 lb

## 2011-08-05 DIAGNOSIS — J40 Bronchitis, not specified as acute or chronic: Secondary | ICD-10-CM

## 2011-08-05 DIAGNOSIS — R059 Cough, unspecified: Secondary | ICD-10-CM

## 2011-08-05 DIAGNOSIS — G4700 Insomnia, unspecified: Secondary | ICD-10-CM | POA: Insufficient documentation

## 2011-08-05 DIAGNOSIS — R05 Cough: Secondary | ICD-10-CM

## 2011-08-05 HISTORY — DX: Insomnia, unspecified: G47.00

## 2011-08-05 MED ORDER — AZITHROMYCIN 250 MG PO TABS: ORAL_TABLET | ORAL | Status: AC

## 2011-08-05 MED ORDER — HYDROCODONE-HOMATROPINE 5-1.5 MG/5ML PO SYRP: 5.0000 mL | ORAL_SOLUTION | Freq: Three times a day (TID) | ORAL | Status: AC | PRN

## 2011-08-05 MED ORDER — IMIPRAMINE HCL 25 MG PO TABS: ORAL_TABLET | ORAL | Status: DC

## 2011-08-05 NOTE — Assessment & Plan Note (Addendum)
Sx c/w bronchitis, he is slt tachycardic but otherwise looks ok (baseline pulse ~95) See instructions

## 2011-08-05 NOTE — Progress Notes (Signed)
  Subjective:    Patient ID: Chase [name], male    DOB: 07-18-1960, 51 y.o.   MRN: [id number]  HPI Acute visit Symptoms that started a week ago with cough, head and chest congestion. He had some subjective fever as well.  Past medical history Hypothyroidism Hyperlipidemia Anxiety, depression Insomnia  GERD   Review of Systems Denies nausea, vomiting, diarrhea. His having sputum, thick and green. Denies hemoptysis or rusty type sputum. No chest pain, mild shortness of breath. Has a hard time sleeping due to cough. + HA , mostly frontal    Objective:   Physical Exam Alert, oriented x3. No apparent distress. Ears normal. Nose is slightly congested, face is symmetric, mildly tender throughout the sinuses. External ocular movements intact. Throat without redness or discharge. Lungs: No rhonchi, wheezing or crackles. No tachypnea or increased work of breathing. Cardiovascular, tachycardic without a murmur       Assessment & Plan:

## 2011-08-05 NOTE — Assessment & Plan Note (Signed)
Takes a number of meds to help w/ insomia, request a RF on imipramine, will do. Told pt needs a ROV if i'm going to RF his meds

## 2011-08-05 NOTE — Patient Instructions (Signed)
Rest, fluids , tylenol For cough, take Mucinex DM twice a day as needed  If the cough continue, use hydrocodone every 8 hours as needed, will make you sleepy Take the antibiotic as prescribed ----> zithromax  Call if no better in few days Call anytime if the symptoms are severe, you have high fever , increased short of breath or develop chest pain

## 2011-08-13 NOTE — Telephone Encounter (Signed)
If needs RF, needs OV, see my last OV note

## 2011-09-26 ENCOUNTER — Other Ambulatory Visit (HOSPITAL_COMMUNITY): Payer: Self-pay | Admitting: Neurosurgery

## 2011-09-26 DIAGNOSIS — D332 Benign neoplasm of brain, unspecified: Secondary | ICD-10-CM

## 2011-10-15 ENCOUNTER — Inpatient Hospital Stay (HOSPITAL_COMMUNITY): Admission: RE | Admit: 2011-10-15 | Payer: BC Managed Care – PPO | Source: Ambulatory Visit

## 2011-10-16 ENCOUNTER — Ambulatory Visit (INDEPENDENT_AMBULATORY_CARE_PROVIDER_SITE_OTHER): Payer: BC Managed Care – PPO | Admitting: Internal Medicine

## 2011-10-16 ENCOUNTER — Encounter: Payer: Self-pay | Admitting: Internal Medicine

## 2011-10-16 VITALS — BP 130/82 | HR 87 | Temp 98.2°F | Wt 187.0 lb

## 2011-10-16 DIAGNOSIS — N6019 Diffuse cystic mastopathy of unspecified breast: Secondary | ICD-10-CM

## 2011-10-16 DIAGNOSIS — M25819 Other specified joint disorders, unspecified shoulder: Secondary | ICD-10-CM

## 2011-10-16 DIAGNOSIS — M7540 Impingement syndrome of unspecified shoulder: Secondary | ICD-10-CM

## 2011-10-16 DIAGNOSIS — H608X9 Other otitis externa, unspecified ear: Secondary | ICD-10-CM

## 2011-10-16 DIAGNOSIS — H6060 Unspecified chronic otitis externa, unspecified ear: Secondary | ICD-10-CM

## 2011-10-16 MED ORDER — NEOMYCIN-POLYMYXIN-HC 3.5-10000-1 OT SOLN: 3.0000 [drp] | Freq: Four times a day (QID) | OTIC | Status: AC

## 2011-10-16 MED ORDER — METOPROLOL TARTRATE 25 MG PO TABS: ORAL_TABLET | ORAL | Status: DC

## 2011-10-16 MED ORDER — ALLOPURINOL 100 MG PO TABS: 100.0000 mg | ORAL_TABLET | Freq: Every day | ORAL | Status: DC

## 2011-10-16 NOTE — Patient Instructions (Signed)
Caffeine ( coffee, tea, cola, chocolate ) can increase fibrocystic changes. Avoid thse to excess as much as  possible. Consider vitamin E 400 international units daily if having active symptoms.

## 2011-10-16 NOTE — Progress Notes (Signed)
  Subjective:    Patient ID: Chase [name], male    DOB: 11/30/1960, 51 y.o.   MRN: [id number]  HPI The last 2 weeks he has had positional pain in the left shoulder; he describes this "as if is being ripped out". The trigger for this may have been an injury sustained playing volleyball. The pain can be up to the level 8 on a 10 scale. After he returns to neutral position the pain may still last 2-3 minutes. Nonsteroidals have been of some benefit. He feels that he now has some right shoulder pain due to overuse. There is no history of shoulder issues.  For several weeks he has noted enlargement of the right areola; it appears somewhat white and is tender to touch. He denies any breast discharge.  For one week or more he describes pain in in both ears; the right appears greater than the left. He has been treated with antibiotic drops by Dr. Scotty Court. 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 This apparently is an error  Review of Systems He feels he's been having some low-grade fevers with chills.Temp has been as high as 100.2. He denies any associated weight loss. He's had some pain in the frontal sinus and maxillary sinus areas. He denies any purulent discharge from his nose.  He denies any purulent sputum production at this time.  He has had intermittent dysuria but denies hematuria or pyuria.      Objective:   Physical Exam General well nourished; no acute distress or increased work of breathing is present.  No  lymphadenopathy about the head, neck, or axilla noted.   Eyes: No conjunctival inflammation or lid edema is present. There is no scleral icterus. There is resting exotropia of the right eye. He has unsustained vertical nystagmus with extraocular motion. Field of vision is essentially normal  Ears:  External ear exam shows no significant lesions or deformities.   Otoscopic examination reveals clear canals, tympanic membranes are intact bilaterally without bulging, retraction, inflammation or discharge. Tympanic membranes are dull. Hearing aids bilaterally  Nose:  External nasal examination shows no deformity or inflammation. Nasal mucosa are pink and moist without lesions or exudates. No septal dislocation or deviation.No obstruction to airflow.   Oral exam: Marked dental plaque is present.There is no oropharyngeal erythema or exudate noted.   Neck:  No deformities, thyromegaly, masses, or tenderness noted.   Some decreased range of motion of the neck.   Heart:  Normal rate and regular rhythm. S1 and S2 normal without gallop, murmur, click, rub .S4 with slurring at  LSB   Lungs:Chest clear to auscultation; no wheezes, rhonchi,rales ,or rubs present.No increased work of breathing.    Breasts: Minor fibrocystic changes; no significant pigment changes.  Extremities:  No cyanosis, edema, or clubbing  noted . Range of motion is normal but is associated with subjective pain. There is a click/instability of the left shoulder with superior elevation and rotation   Skin: Warm & dry w/o jaundice or tenting.          Assessment & Plan:   #1 shoulder impingement syndrome  ; rule out rotator cuff tear. Orthopedic consultation indicated  #2 minor fibrocystic breast changes; no significant pathology present. Vitamin D supplementation short-term recommended  #3 probable low-grade otic canal cellulitis.  Plan see orders & recommendation

## 2011-10-16 NOTE — Progress Notes (Signed)
Addended by: Maurice Small on: 10/16/2011 05:02 PM   Modules accepted: Orders

## 2011-10-17 ENCOUNTER — Telehealth: Payer: Self-pay | Admitting: Internal Medicine

## 2011-10-17 ENCOUNTER — Ambulatory Visit (HOSPITAL_COMMUNITY)
Admission: RE | Admit: 2011-10-17 | Discharge: 2011-10-17 | Disposition: A | Discharge status: Home / Self Care | Payer: BC Managed Care – PPO | Source: Ambulatory Visit | Attending: Neurosurgery | Admitting: Neurosurgery

## 2011-10-17 DIAGNOSIS — I672 Cerebral atherosclerosis: Secondary | ICD-10-CM | POA: Insufficient documentation

## 2011-10-17 DIAGNOSIS — R51 Headache: Secondary | ICD-10-CM | POA: Insufficient documentation

## 2011-10-17 DIAGNOSIS — D332 Benign neoplasm of brain, unspecified: Secondary | ICD-10-CM

## 2011-10-17 DIAGNOSIS — E348 Other specified endocrine disorders: Secondary | ICD-10-CM | POA: Insufficient documentation

## 2011-10-17 DIAGNOSIS — I6350 Cerebral infarction due to unspecified occlusion or stenosis of unspecified cerebral artery: Secondary | ICD-10-CM | POA: Insufficient documentation

## 2011-10-17 LAB — CREATININE, SERUM
Creatinine, Ser: 1.24 mg/dL (ref 0.50–1.35)
GFR calc Af Amer: 76 mL/min — ABNORMAL LOW (ref >90)
GFR calc non Af Amer: 66 mL/min — ABNORMAL LOW (ref >90)

## 2011-10-17 LAB — BUN: BUN: 16 mg/dL (ref 6–23)

## 2011-10-17 MED ORDER — GADOBENATE DIMEGLUMINE 529 MG/ML IV SOLN
18.0000 mL | Freq: Once | INTRAVENOUS | Status: AC
  Administered 2011-10-17: 18 mL via INTRAVENOUS

## 2011-10-17 NOTE — Telephone Encounter (Signed)
Denied, see OV 07-2011, needs ROV if I'm going to RF meds

## 2011-10-17 NOTE — Telephone Encounter (Signed)
Refill valium/Diazepam 5MG  tablet  Patient stopped by after visit with Alwyn Ren yesterday and stated he needed refill for the above can send to pharmacy   [id number] - [place], [place] - [address]

## 2011-10-17 NOTE — Telephone Encounter (Signed)
Called LVM that patient needed to see Dr.Paz to get this type of medication refilled and it would only take 

## 2011-10-17 NOTE — Telephone Encounter (Signed)
Dr. Drue Novel says that he needs an OV to see him before he will refill his valium. Please call pt to schedule an OV with Dr. Drue Novel.

## 2011-10-17 NOTE — Telephone Encounter (Signed)
Ok to refill 

## 2011-10-23 NOTE — Telephone Encounter (Signed)
Addendum

## 2011-10-23 NOTE — Telephone Encounter (Signed)
Addendum: I meant to say ---> Denied, I have not sitn down to talk to him about anxiety. After I evaluate him  I may (or may not)  prescribe anxiety medication long-term.

## 2011-10-23 NOTE — Telephone Encounter (Signed)
Denied, I have not sit down to talk to him about pain. After I evaluate him I may ( or may not) prescribe pain medication long-term.

## 2011-10-23 NOTE — Telephone Encounter (Signed)
Patient called in today scheduled a physical so he would only have to pay 1-co-pay. CPE scheduled for 6.12.13 at 1PM. Can you please refill the medication to get him thru to this date? He stated he only has 5-left. Patient ph# [phone]

## 2011-10-23 NOTE — Telephone Encounter (Signed)
Ok to refill diazepam to get pt through until CPE?

## 2011-10-24 NOTE — Telephone Encounter (Signed)
Spoke to patient explain to him that the medication was not prescribed as a long term anxiety drug and he would need to schedule an appointment specific to anxiety. Patient was upset, stated he did not understand why he could have another prescription, since he had been using it regularly. I went over the details with him again stating this medication was not prescribed as a maintenance medication and if he needed it for anxiety he would need to be diagnosed as such or advise what he was using it for and said he would think about it and call me back.

## 2011-10-31 ENCOUNTER — Telehealth: Payer: Self-pay | Admitting: Internal Medicine

## 2011-10-31 NOTE — Telephone Encounter (Signed)
Caller: Chase [name]/Patient; PCP: Willow Ora; CB#: [phone];  Call regarding No BM in 3-4 Days- Today he passed small hard stool. He has trouble with Hemhorroids if stool does not stay soft. Recommended to increase fluids and fiber. Triage and Care advice per Constipation Protocol.  HE NEEDS REFILL ON DIAZEMPAM  5mg s 1 tab PO TID-which he has taken for past 3-4 years. HE USES [facility] ON [address]  HE ONLY HAS 2-3 TABS LEFT AND THEY HELP HIM WITH SLEEP AND ANXIETY. HE HAS SLEEP APNEA AND CAN NOT AFFORD EQUIPTMENT FOR CPAP MACHINE- NEEDS BOWL THAT GOES IN CPAP THAT HOLDS THE WATER. Advised to calll for appnt on 11/03/11- PER SLEEP DISORDER PROTOCOL.

## 2011-10-31 NOTE — Telephone Encounter (Signed)
See phone call from 10-23-11, I can not Rx diazepam

## 2011-11-03 ENCOUNTER — Ambulatory Visit (INDEPENDENT_AMBULATORY_CARE_PROVIDER_SITE_OTHER): Payer: BC Managed Care – PPO | Admitting: Internal Medicine

## 2011-11-03 VITALS — BP 148/90 | HR 98 | Temp 98.3°F | Wt 193.0 lb

## 2011-11-03 DIAGNOSIS — E299 Testicular dysfunction, unspecified: Secondary | ICD-10-CM

## 2011-11-03 DIAGNOSIS — R5381 Other malaise: Secondary | ICD-10-CM

## 2011-11-03 DIAGNOSIS — G4730 Sleep apnea, unspecified: Secondary | ICD-10-CM

## 2011-11-03 DIAGNOSIS — R5383 Other fatigue: Secondary | ICD-10-CM

## 2011-11-03 DIAGNOSIS — F341 Dysthymic disorder: Secondary | ICD-10-CM

## 2011-11-03 DIAGNOSIS — R7309 Other abnormal glucose: Secondary | ICD-10-CM

## 2011-11-03 DIAGNOSIS — R739 Hyperglycemia, unspecified: Secondary | ICD-10-CM

## 2011-11-03 DIAGNOSIS — F419 Anxiety disorder, unspecified: Secondary | ICD-10-CM

## 2011-11-03 DIAGNOSIS — E039 Hypothyroidism, unspecified: Secondary | ICD-10-CM

## 2011-11-03 DIAGNOSIS — R300 Dysuria: Secondary | ICD-10-CM

## 2011-11-03 LAB — POCT URINALYSIS DIPSTICK
Bilirubin, UA: NEGATIVE
Blood, UA: NEGATIVE
Glucose, UA: NEGATIVE
Ketones, UA: NEGATIVE
Leukocytes, UA: NEGATIVE
Nitrite, UA: NEGATIVE
Spec Grav, UA: 1.01
Urobilinogen, UA: 0.2
pH, UA: 7.5

## 2011-11-03 MED ORDER — DIAZEPAM 5 MG PO TABS: 5.0000 mg | ORAL_TABLET | Freq: Three times a day (TID) | ORAL | Status: DC

## 2011-11-03 MED ORDER — ESCITALOPRAM OXALATE 10 MG PO TABS: 10.0000 mg | ORAL_TABLET | Freq: Every day | ORAL | Status: DC

## 2011-11-03 NOTE — Assessment & Plan Note (Signed)
Hypothyroidism? TSH have been normal. Check a TSH today

## 2011-11-03 NOTE — Assessment & Plan Note (Addendum)
Complains of severe fatigue for at least a year, on chart review, that has been of concern since at least 2009. Will check general labs including a hemoglobin A1c Most likely untreated  sleep apnea is playing a role.

## 2011-11-03 NOTE — Assessment & Plan Note (Addendum)
Rectal exam show a normal size prostate that is slightly tender. Plan: Urine culture, PSA, consider treatment for prostatitis if PSA elevated

## 2011-11-03 NOTE — Progress Notes (Signed)
  Subjective:    Patient ID: Chase [name], male    DOB: [birth year], 51 y.o.   MRN: [id number]  HPI Here with several concerns  Anxiety and depression, long history of such, not well controlled, request a refill on diazepam. See assessment and plan.  Reports a history of a sleep apnea, diagnosed in 2007, unable to afford some of the equipment. See assessment and plan  Long history of lack of energy, described as fatigue, takes naps during the day, decreased focus, reports no issues driving.  Also reports painful urination, worse in the morning. Denies hematuria or difficulty urinating.  I notice he takes allopurinol, he's not sure if he has gout, was prescribed allopurinol at some point and it was never discontinued.  Past medical history  Gout? OSA Hyperlipidemia  Anxiety, depression  Insomnia  GERD    Review of Systems     Objective:   Physical Exam General -- alert, well-developed. No apparent distress.  Neck --no LADs, no thyromegaly Lungs -- normal respiratory effort, no intercostal retractions, no accessory muscle use, and normal breath sounds.   Heart-- normal rate, regular rhythm, no murmur, and no gallop.   Abdomen--soft, not distended, diffuse mild tenderness without rebound. Normal bowel sounds GU-- normal prostate size, slightly tender? Extremities-- no pretibial edema bilaterally  Neurologic-- alert & oriented X3 and strength normal in all extremities. Psych-- Cognition and judgment appear intact. Alert and cooperative with normal attention span and concentration.  not anxious appearing and not depressed appearing.       Assessment & Plan:  The patient has multiple concerns, he has approximately 30 diagnosis in his problem list, unclear to me  if the diagnoses were based on labs or certain findings.  We've started to assess all the diagnoses that he has listed today I will continue to do so in the next few visits. Time spent with patient more than 25  minutes, more than 50% of the time counseling or  reviewing the chart.

## 2011-11-03 NOTE — Assessment & Plan Note (Signed)
Previous 2 testosterone levels normal.

## 2011-11-03 NOTE — Assessment & Plan Note (Addendum)
Reports a long history of anxiety and depression. Currently on diazepam, Tofranil and Atarax. He reports that at some point she took Remeron and Effexor with very good results. Unclear why they were stopped (thinks maybe due to cost). Does not recall trying  other SSRI such as Zoloft, Paxil and Lexapro. Plan: Wean off Tofranil, start Lexapro. RF diazepam

## 2011-11-03 NOTE — Assessment & Plan Note (Signed)
Reports a history of sleep apnea, diagnosed in 2007 by Dr. Shelle Iron with a sleep study. He try a CPAP but he really never was able to get used to the mask. Currently unable to afford some of the equipment.

## 2011-11-04 ENCOUNTER — Encounter: Payer: Self-pay | Admitting: Internal Medicine

## 2011-11-04 LAB — CBC WITH DIFFERENTIAL/PLATELET
Basophils Absolute: 0 10*3/uL (ref 0.0–0.1)
Basophils Relative: 0.7 % (ref 0.0–3.0)
Eosinophils Absolute: 0.3 10*3/uL (ref 0.0–0.7)
Eosinophils Relative: 5.1 % — ABNORMAL HIGH (ref 0.0–5.0)
HCT: 43.2 % (ref 39.0–52.0)
Hemoglobin: 14.2 g/dL (ref 13.0–17.0)
Lymphocytes Relative: 38.4 % (ref 12.0–46.0)
Lymphs Abs: 2.1 10*3/uL (ref 0.7–4.0)
MCHC: 32.9 g/dL (ref 30.0–36.0)
MCV: 91.1 fl (ref 78.0–100.0)
Monocytes Absolute: 0.5 10*3/uL (ref 0.1–1.0)
Monocytes Relative: 10.1 % (ref 3.0–12.0)
Neutro Abs: 2.5 10*3/uL (ref 1.4–7.7)
Neutrophils Relative %: 45.7 % (ref 43.0–77.0)
Platelets: 283 10*3/uL (ref 150.0–400.0)
RBC: 4.75 Mil/uL (ref 4.22–5.81)
RDW: 13.5 % (ref 11.5–14.6)
WBC: 5.4 10*3/uL (ref 4.5–10.5)

## 2011-11-04 LAB — COMPREHENSIVE METABOLIC PANEL
ALT: 26 U/L (ref 0–53)
AST: 22 U/L (ref 0–37)
Albumin: 4 g/dL (ref 3.5–5.2)
Alkaline Phosphatase: 73 U/L (ref 39–117)
BUN: 17 mg/dL (ref 6–23)
CO2: 28 mEq/L (ref 19–32)
Calcium: 9.2 mg/dL (ref 8.4–10.5)
Chloride: 102 mEq/L (ref 96–112)
Creatinine, Ser: 1.2 mg/dL (ref 0.4–1.5)
GFR: 65.89 mL/min (ref >60.00)
Glucose, Bld: 106 mg/dL — ABNORMAL HIGH (ref 70–99)
Potassium: 4.4 mEq/L (ref 3.5–5.1)
Sodium: 140 mEq/L (ref 135–145)
Total Bilirubin: 0.6 mg/dL (ref 0.3–1.2)
Total Protein: 6.5 g/dL (ref 6.0–8.3)

## 2011-11-04 LAB — TSH: TSH: 2.5 u[IU]/mL (ref 0.35–5.50)

## 2011-11-04 LAB — HEMOGLOBIN A1C: Hgb A1c MFr Bld: 6.2 % (ref 4.6–6.5)

## 2011-11-04 LAB — PSA: PSA: 1.12 ng/mL (ref 0.10–4.00)

## 2011-11-05 LAB — URINE CULTURE
Colony Count: NO GROWTH
Organism ID, Bacteria: NO GROWTH

## 2011-11-06 ENCOUNTER — Encounter: Payer: Self-pay | Admitting: Internal Medicine

## 2011-11-06 ENCOUNTER — Telehealth: Payer: Self-pay | Admitting: Internal Medicine

## 2011-11-06 NOTE — Telephone Encounter (Signed)
Discussed with pt, he is coming in tomorrow.

## 2011-11-06 NOTE — Telephone Encounter (Signed)
Patient sent me a message about headaches,  we did not have time to address that concern, please schedule an acute visit for tomorrow at 3 PM.

## 2011-11-07 ENCOUNTER — Ambulatory Visit (INDEPENDENT_AMBULATORY_CARE_PROVIDER_SITE_OTHER): Payer: BC Managed Care – PPO | Admitting: Internal Medicine

## 2011-11-07 VITALS — BP 138/82 | HR 97 | Temp 98.3°F | Wt 190.0 lb

## 2011-11-07 DIAGNOSIS — R51 Headache: Secondary | ICD-10-CM

## 2011-11-07 DIAGNOSIS — R7303 Prediabetes: Secondary | ICD-10-CM

## 2011-11-07 DIAGNOSIS — E348 Other specified endocrine disorders: Secondary | ICD-10-CM

## 2011-11-07 DIAGNOSIS — E039 Hypothyroidism, unspecified: Secondary | ICD-10-CM

## 2011-11-07 DIAGNOSIS — F341 Dysthymic disorder: Secondary | ICD-10-CM

## 2011-11-07 DIAGNOSIS — R7309 Other abnormal glucose: Secondary | ICD-10-CM

## 2011-11-07 MED ORDER — CETIRIZINE HCL 10 MG PO TABS: 10.0000 mg | ORAL_TABLET | Freq: Every day | ORAL | Status: DC

## 2011-11-07 NOTE — Patient Instructions (Signed)
Stop imipramine Qnsal -- 2 sprays every day on its side of the nose. Claritin 10 mg over-the-counter one tablet daily Follow up in a month

## 2011-11-07 NOTE — Assessment & Plan Note (Signed)
TSH normal

## 2011-11-07 NOTE — Assessment & Plan Note (Signed)
As recommended the last time he   started SSRIs and she decrease the dose of Tofranil. Actually feeling slightly better. He is concerned about drug interactions. Plan discontinue Tofranil

## 2011-11-07 NOTE — Progress Notes (Signed)
  Subjective:    Patient ID: Chase [name], male    DOB: 1960/12/04, 51 y.o.   MRN: [id number]  HPI After the last office visit, he had several concerns, He research his medicines and found that there are  many interactions and he is concerned about that. He likes to discuss his labs. Also has a long history of headaches, they are usually located at the top of the head in the frontal area. These are chronic issues, as far back as 2007 I see a CT of the sinuses was ordered (normal) . He denies fevers but admits sinus congestion and ears feeling clogged. At the last visit, we added that SSRI and we are weaning off the Tofranil  Past medical history  Borderline diabetes, DX 5 -2013 Hyperlipidemia Anxiety and depression  OSA  insomnia Gout? 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 Sandria Manly 2010  Review of Systems     Objective:   Physical Exam  General -- alert, well-developed HEENT -- TMs normal, throat w/o redness, face symmetric and not tender to palpation, nose slt congested ; EOMI,  post surgical changes at the right pretibial  Extremities-- no pretibial edema bilaterally  Neurologic-- alert & oriented X3 , gait and speech normal , strength normal in all extremities. Psych-- Cognition and judgment appear intact. Alert and cooperative with normal attention span and concentration.  not anxious appearing and not depressed appearing.       Assessment & Plan:   Polypharmacy and potential interactions, and occasionally is reviewed with the patient, I don't see any serious interactions. Because he is doing so well with the decreased dose of Tofranil we decided to discontinue it .  All labs discussed   Today , I spent more than 25   min with the patient, >50% of the time counseling,  Answering several questions about interactions, reviewing the old chart. See assessment and plan

## 2011-11-07 NOTE — Assessment & Plan Note (Addendum)
See history of present illness, long  history of headaches, on chart review, he had a CT of the sinuses in 2007  Symptoms probably multifactorial: Tension HAs ?,  Allergies? Related to pneal gland cyst? Plan: Samples of Qnsal + Claritin over-the-counter He'll come back in one month, will reassess, will consider a neurology referral , noting he is due for a brain MRI (see "pineal gland")

## 2011-11-07 NOTE — Assessment & Plan Note (Signed)
History of a pineal gland cyst, Last followup with neurosurgery to my knowledge was 10/2009, MRI was stable,  he recommended a two-year followup Also, he had an abnormal MRI of the brain evaluated by Dr. Sandria Manly in 2010

## 2011-11-07 NOTE — Assessment & Plan Note (Signed)
A1c of 6.2 discuss with the patient. Refer to a nutritionist. Exercise daily. Recheck in few months

## 2011-11-20 ENCOUNTER — Encounter: Payer: BC Managed Care – PPO | Attending: Internal Medicine | Admitting: *Deleted

## 2011-11-20 ENCOUNTER — Encounter: Payer: Self-pay | Admitting: *Deleted

## 2011-11-20 VITALS — Ht 70.5 in | Wt 192.1 lb

## 2011-11-20 DIAGNOSIS — Z713 Dietary counseling and surveillance: Secondary | ICD-10-CM | POA: Insufficient documentation

## 2011-11-20 DIAGNOSIS — R7309 Other abnormal glucose: Secondary | ICD-10-CM | POA: Insufficient documentation

## 2011-11-20 DIAGNOSIS — R7303 Prediabetes: Secondary | ICD-10-CM

## 2011-11-20 NOTE — Patient Instructions (Signed)
Goals:  Follow Diabetes Meal Plan as instructed  Eat 3 meals and 2 snacks, every 3-5 hrs  Limit carbohydrate intake to 45-60 grams carbohydrate/meal (3-4 choices)  Limit carbohydrate intake to 15 grams carbohydrate/snack (1 carb choice)  Add lean protein foods to meals/snacks  Supplement snacks with lean protein and/or vegetables  Aim for 10 mins of physical activity when able  Choose non-sweetened beverage: water or unsweetened flavored drink.  Limit juices and chocolate flavored drinks  Choose more whole grains

## 2011-11-20 NOTE — Progress Notes (Signed)
  Medical Nutrition Therapy:  Appt start time: 1030 end time:  1130.   Assessment:  Primary concerns today: prediabetes.   MEDICATIONS: see list   DIETARY INTAKE:  Usual eating pattern includes 3 meals and 3 snacks per day.  Everyday foods include ice cream and other sweets, some whole grains.  Avoided foods include beef or pork.    24-hr recall:  B ( AM): cocoa puffs cereal bowl or 2 with skim milk; krave cereal  (chocolate chip) 2 bowls; 2 kellogs frozen waffles plain with 16 oz juice  Snk ( AM): maybe yogurt or ice cream (vanilla with chocolate brownie/fudge)  L ( PM): cereal or leftovers (whole wheat spaghetti with ground [place]) or may skip or may have more ice cream; may have sub Snk ( PM): ice cream or cookies or toast with jelly or poptart; sometimes chip D ( PM): wheat spaghetti with [place] sauce; 6 in sub sandwich sometimes chips.  Sometimes has vegetables Snk ( PM): something sweet Beverages: water or chocolate almond milk or vitamin water or sobe or juice  Usual physical activity: none  Estimated energy needs: 1800 calories 200 g carbohydrates 135 g protein 50 g fat  Progress Towards Goal(s):  In progress.   Nutritional Diagnosis:  NB-1.1 Food and nutrition-related knowledge deficit related to carbohydrate-containing foods/beverages.  As evidenced by elevated HgA1C.    Intervention:  Nutrition counseling provided.  Patient has prediabetes.  Discussed role of diet and exercise leading to weight gain and role on insulin resistance.  Discussed carb counting, reading food labels, portion control, and physical activity.  Encouraged limiting concentrated sweets and increasing fiber from whole grain and vegetables (slowly as to limit GI distress).   Handouts given during visit include: Carb Counting and Food Label handouts Meal Plan Card  Diabetes MyPlate handout  Goals:  Follow Diabetes Meal Plan as instructed  Eat 3 meals and 2 snacks, every 3-5 hrs  Limit  carbohydrate intake to 45-60 grams carbohydrate/meal (3-4 choices)  Limit carbohydrate intake to 15 grams carbohydrate/snack (1 carb choice)  Add lean protein foods to meals/snacks  Supplement snacks with lean protein and/or vegetables  Aim for 10 mins of physical activity when able  Choose non-sweetened beverage: water or unsweetened flavored drink.  Limit juices and chocolate flavored drinks  Choose more whole grains  Monitoring/Evaluation:  Dietary intake, exercise, and body weight in 3 month(s).

## 2011-11-26 ENCOUNTER — Ambulatory Visit (INDEPENDENT_AMBULATORY_CARE_PROVIDER_SITE_OTHER): Payer: BC Managed Care – PPO | Admitting: Internal Medicine

## 2011-11-26 VITALS — BP 134/88 | HR 89 | Temp 97.9°F | Ht 70.0 in | Wt 191.0 lb

## 2011-11-26 DIAGNOSIS — Z79899 Other long term (current) drug therapy: Secondary | ICD-10-CM

## 2011-11-26 DIAGNOSIS — Z0000 Encounter for general adult medical examination without abnormal findings: Secondary | ICD-10-CM | POA: Insufficient documentation

## 2011-11-26 DIAGNOSIS — G4730 Sleep apnea, unspecified: Secondary | ICD-10-CM

## 2011-11-26 DIAGNOSIS — N200 Calculus of kidney: Secondary | ICD-10-CM

## 2011-11-26 DIAGNOSIS — R51 Headache: Secondary | ICD-10-CM

## 2011-11-26 DIAGNOSIS — K219 Gastro-esophageal reflux disease without esophagitis: Secondary | ICD-10-CM

## 2011-11-26 DIAGNOSIS — F341 Dysthymic disorder: Secondary | ICD-10-CM

## 2011-11-26 DIAGNOSIS — J309 Allergic rhinitis, unspecified: Secondary | ICD-10-CM

## 2011-11-26 DIAGNOSIS — R300 Dysuria: Secondary | ICD-10-CM

## 2011-11-26 HISTORY — DX: Encounter for general adult medical examination without abnormal findings: Z00.00

## 2011-11-26 LAB — LIPID PANEL
Cholesterol: 212 mg/dL — ABNORMAL HIGH (ref 0–200)
HDL: 38.4 mg/dL — ABNORMAL LOW (ref >39.00)
Total CHOL/HDL Ratio: 6
Triglycerides: 174 mg/dL — ABNORMAL HIGH (ref 0.0–149.0)
VLDL: 34.8 mg/dL (ref 0.0–40.0)

## 2011-11-26 LAB — LDL CHOLESTEROL, DIRECT: Direct LDL: 142.4 mg/dL

## 2011-11-26 MED ORDER — FLUTICASONE PROPIONATE 50 MCG/ACT NA SUSP: 2.0000 | Freq: Every day | NASAL | Status: DC

## 2011-11-26 MED ORDER — ESCITALOPRAM OXALATE 20 MG PO TABS: 20.0000 mg | ORAL_TABLET | Freq: Every day | ORAL | Status: AC

## 2011-11-26 MED ORDER — RANITIDINE HCL 15 MG/ML PO SYRP: ORAL_SOLUTION | ORAL | Status: DC

## 2011-11-26 NOTE — Assessment & Plan Note (Signed)
Td 2010 Normal colonoscopy and EGD 02-2010; colonoscopy was done for rectal bleeding which is persistent. Plan observation Recent PSA normal, DRE normal. Nitroglycerin in is in his medication list, at some point had chest pain and palpitations, reports and negative stress test, report not found. Plan: Discontinue nitroglycerin for now.

## 2011-11-26 NOTE — Assessment & Plan Note (Addendum)
Did not get Claritin, could not tolerate Qnsal Ref flonase (generic) , claritin

## 2011-11-26 NOTE — Assessment & Plan Note (Signed)
On allopurinol for uric acid stones

## 2011-11-26 NOTE — Assessment & Plan Note (Signed)
See history of present illness, increase Lexapro to 20. New prescription sent

## 2011-11-26 NOTE — Assessment & Plan Note (Signed)
Well-controlled with Zantac, refill meds. Takes Prilosec when necessary

## 2011-11-26 NOTE — Assessment & Plan Note (Signed)
Ongoing issue, refer to neurology

## 2011-11-26 NOTE — Progress Notes (Signed)
  Subjective:    Patient ID: Chase [name], male    DOB: 1960-08-10, 51 y.o.   MRN: [id number]  HPI Complete physical exam followup from previous visit.  Past medical history  Borderline diabetes, DX 5 -2013  Elevated BP Hyperlipidemia  Anxiety and depression  OSA  insomnia  Gout?  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 Sandria Manly 2010 H/o kidney stones  Past surgical history R arm removal of a bone cyst R ear surgery Cataract x 3 , first 1967 Stent R urethra  Social history dovorced 2002, children x 2 , lives w/ girlfriend  Tobacco-- never  ETOH-- no Not working at present  Family history Diabetes-- no CAD-- F ? Stroke-- F?, multiple aunts COPD-- F  Colon cancer-- no Breast cancer-- M Prostate cancer-- no   Review of Systems Good compliance with Lexapro, he discontinue Tofranil. Overall sx better but not completely well, still has some stress and depression. Headaches, I recommend to Qnsal and Claritin. Did not get Claritin, Qnsal burn the nose so he is not using it. Headache about the same as before. Nitroglycerin is in his med list, he reports that in the past he had chest pain or palpitations, a stress test was   reportedly negative. No report found in the chart. GERD symptoms well controlled with Zantac, needs a refill, takes Prilosec as needed only. He occasionally has nausea but no vomiting or diarrhea. He has chronic and a sporadic light-red blood per rectum. Previous colonoscopy normal. See assessment and plan    Objective:   Physical Exam General -- alert, well-developed, and overweight appearing. No apparent distress.  Neck --no thyromegaly HEENT-- no wax, d/c, TM not bulge, face symmetric, no se clear, throat w/o redness  Lungs -- normal respiratory effort, no intercostal retractions, no accessory muscle use, and normal breath sounds.   Heart-- normal rate, regular rhythm, no murmur, and no  gallop.   Abdomen--soft, , not distended, mild tenderness throughout the abdomen without mass or rebound.   Extremities-- no pretibial edema bilaterally  DRE--external hemorrhoids noted, no rectal mass or polyp, prostate normal in size, not nodular or tender Neurologic-- alert & oriented X3 and strength normal in all extremities. Psych-- Cognition and judgment appear intact. Alert and cooperative with normal attention span and concentration.  not anxious appearing and not depressed appearing.       Assessment & Plan:

## 2011-11-26 NOTE — Assessment & Plan Note (Signed)
Today he states that he still has a little bit of dysuria, recent PSA normal, DRE normal today, recent urine culture negative. Plan: Observation

## 2011-11-27 ENCOUNTER — Encounter: Payer: Self-pay | Admitting: Internal Medicine

## 2011-12-05 MED ORDER — ATORVASTATIN CALCIUM 10 MG PO TABS: ORAL_TABLET | ORAL | Status: DC

## 2011-12-05 NOTE — Addendum Note (Signed)
Addended by: Edwena Felty T on: 12/05/2011 10:23 AM   Modules accepted: Orders

## 2012-01-22 ENCOUNTER — Other Ambulatory Visit: Payer: Self-pay | Admitting: Family Medicine

## 2012-01-23 ENCOUNTER — Other Ambulatory Visit: Payer: Self-pay | Admitting: Internal Medicine

## 2012-01-23 DIAGNOSIS — F419 Anxiety disorder, unspecified: Secondary | ICD-10-CM

## 2012-01-23 MED ORDER — DIAZEPAM 5 MG PO TABS: 5.0000 mg | ORAL_TABLET | Freq: Three times a day (TID) | ORAL | Status: DC

## 2012-01-23 NOTE — Telephone Encounter (Signed)
Refill diazepam 5mg  tablet, take one tablet by mouth 3-times a day, last fill 5.20.13  Last wrt 5.20.13-#90 no refills Last ov 6.12.13 CPE

## 2012-01-23 NOTE — Telephone Encounter (Signed)
Refill done.  

## 2012-01-23 NOTE — Telephone Encounter (Signed)
Ok for #30, no refills 

## 2012-01-23 NOTE — Telephone Encounter (Signed)
Ok to refill 

## 2012-01-29 NOTE — Telephone Encounter (Signed)
Rx sent 

## 2012-01-29 NOTE — Telephone Encounter (Signed)
Last Filled 01-01-11 #30,last OV 11-26-11

## 2012-01-29 NOTE — Telephone Encounter (Signed)
Ok for #30, no refills 

## 2012-02-17 ENCOUNTER — Ambulatory Visit: Payer: BC Managed Care – PPO | Admitting: *Deleted

## 2012-02-24 ENCOUNTER — Encounter: Payer: Self-pay | Admitting: *Deleted

## 2012-02-24 ENCOUNTER — Encounter: Payer: BC Managed Care – PPO | Attending: Internal Medicine | Admitting: *Deleted

## 2012-02-24 VITALS — Ht 70.5 in | Wt 194.0 lb

## 2012-02-24 DIAGNOSIS — R7309 Other abnormal glucose: Secondary | ICD-10-CM | POA: Insufficient documentation

## 2012-02-24 DIAGNOSIS — R7303 Prediabetes: Secondary | ICD-10-CM

## 2012-02-24 DIAGNOSIS — Z713 Dietary counseling and surveillance: Secondary | ICD-10-CM | POA: Insufficient documentation

## 2012-02-24 NOTE — Patient Instructions (Addendum)
Choose items with less than 10g sugar Choose items with whole grain listed as first ingredient Look for sugar-free snack items (pudding, popsicle, etc)

## 2012-02-24 NOTE — Progress Notes (Signed)
  Medical Nutrition Therapy:  Appt start time: 1030 end time:  1100.   Assessment:  Primary concerns today: prediabetes follow up.   MEDICATIONS: see list   DIETARY INTAKE:  Usual eating pattern includes 3 meals and 3 snacks per day.  Everyday foods include sweets, some proteins.  Avoided foods include beef or pork  24-hr recall:  B ( AM): whole grain waffles or cereal (rice chex or krave) Snk ( AM): low fat ice cream or who knew cookies L ( PM): leftovers sandwich with whole grain noodles or [place] burger Snk ( PM): ice cream or cookies Dinner (PM): like lunch or subway Snk ( PM): ice cream or cookies Beverages: sugar-free flavored water or dark chocolate   Usual physical activity: walks 1 time a week  Estimated energy needs: 1800 calories 200 g carbohydrates 135 g protein 50 g fat  Progress Towards Goal(s):  Some progress.   Nutritional Diagnosis:  NB-1.1 Food and nutrition-related knowledge deficit As related to proper balance of fats, proteins, and carbohydrates.  As evidenced by HgA1C of 6.2%.    Intervention:  Nutrition counseling provided.  Azir is here for a follow up visit pertaining to his elevated HgA1C.  He has made a few changes- he tries to choose foods made with whole grains, but he doesn't choose 100% whole grains.  He still snacks heavily on sugary foods and he's not physically active.  Encourages activity 2 days/week; he says much activity causes him pain.  Encouraged 100% whole grain items and discussed reading food labels to look for 100% whole grain.  Also strongly suggested cutting back on sugary snacks.  To satisfy his sweet tooth, recommended sugar-free products.   Recommended <10g sugar/serving  Monitoring/Evaluation:  Dietary intake, exercise, and body weight in 1 month(s).  He would like to discuss general nutrition- MyPlate **offer office manage contact information for patient satisfaction feedback

## 2012-02-29 ENCOUNTER — Other Ambulatory Visit: Payer: Self-pay | Admitting: Internal Medicine

## 2012-02-29 DIAGNOSIS — F419 Anxiety disorder, unspecified: Secondary | ICD-10-CM

## 2012-03-01 MED ORDER — DIAZEPAM 5 MG PO TABS: 5.0000 mg | ORAL_TABLET | Freq: Three times a day (TID) | ORAL | Status: DC

## 2012-03-01 NOTE — Telephone Encounter (Signed)
Ok to refill 

## 2012-03-01 NOTE — Telephone Encounter (Signed)
Refill: Diazepam 5mg  tablet. Take 1 tablet by mouth 3 times a day. Last fill 8.9.13

## 2012-03-01 NOTE — Telephone Encounter (Signed)
Done

## 2012-03-02 ENCOUNTER — Ambulatory Visit (INDEPENDENT_AMBULATORY_CARE_PROVIDER_SITE_OTHER): Payer: BC Managed Care – PPO | Admitting: Internal Medicine

## 2012-03-02 ENCOUNTER — Encounter: Payer: Self-pay | Admitting: Internal Medicine

## 2012-03-02 VITALS — BP 134/78 | HR 80 | Temp 97.9°F | Wt 194.0 lb

## 2012-03-02 DIAGNOSIS — E785 Hyperlipidemia, unspecified: Secondary | ICD-10-CM

## 2012-03-02 DIAGNOSIS — J309 Allergic rhinitis, unspecified: Secondary | ICD-10-CM

## 2012-03-02 MED ORDER — FLUTICASONE PROPIONATE 50 MCG/ACT NA SUSP: 2.0000 | Freq: Every day | NASAL | Status: DC

## 2012-03-02 MED ORDER — ALLOPURINOL 100 MG PO TABS: 100.0000 mg | ORAL_TABLET | Freq: Every day | ORAL | Status: DC

## 2012-03-02 MED ORDER — NAPROXEN 125 MG/5ML PO SUSP: 500.0000 mg | Freq: Two times a day (BID) | ORAL | Status: DC

## 2012-03-02 MED ORDER — AZELASTINE HCL 0.1 % NA SOLN: 2.0000 | Freq: Two times a day (BID) | NASAL | Status: DC

## 2012-03-02 MED ORDER — METOPROLOL TARTRATE 25 MG PO TABS: ORAL_TABLET | ORAL | Status: DC

## 2012-03-02 NOTE — Patient Instructions (Addendum)
Restart Zyrtec daily Continue with Flonase add Astelin which is another nose spray Mucinex DM twice a day as needed Call if no better in few days  -----  Next visit by December 2013

## 2012-03-02 NOTE — Assessment & Plan Note (Addendum)
Upper respiratory symptoms, allergies versus URI. He's not taking Zyrtec, recommend to restart zyrtec continue flonase, add astelin See instruction

## 2012-03-02 NOTE — Progress Notes (Signed)
  Subjective:    Patient ID: Chase [name], male    DOB: [birth year], 51 y.o.   MRN: [id number]  HPI Acute visit Developed some "sores" in the arms and legs 2 weeks ago, denies the areas to be painful but had some itching. The spots are now clearing. 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  Also a few days history of pressure and  sinus pain. On further questioning, he is not taking Zyrtec, he is complaining of itchy eyes itchy nose and sneezing some. Very mild postnasal dripping and cough. Low-grade fever?.  Reports he is taking Lipitor as prescribed.  Past medical history   Borderline diabetes, DX 5 -2013   Elevated BP Hyperlipidemia   Anxiety and depression   OSA   insomnia   Gout?   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 Sandria Manly 2010 H/o kidney stones  Past surgical history R arm removal of a bone cyst R ear surgery Cataract x 3 , first 1967 Stent R urethra  Social history dovorced 2002, children x 2 , lives w/ girlfriend  Tobacco-- never   ETOH-- no Not working at present  Family history Diabetes-- no CAD-- F ? Stroke-- F?, multiple aunts COPD-- F   Colon cancer-- no Breast cancer-- M Prostate cancer-- no   Review of Systems See history of present illness    Objective:   Physical Exam  General -- alert, well-developed, and well-nourished.   HEENT -- TMs normal, throat w/o redness, face symmetric and not tender to palpation, nose is slightly congested without discharge. Lungs -- normal respiratory effort, no intercostal retractions, no accessory muscle use, and normal breath sounds.   Heart-- normal rate, regular rhythm, no murmur, and no gallop.   Skin-- he points to 3 lesions (right, left arm and right leg), they are less than 1 mm, mild erythema without scaling or blisters. Psych-- Cognition and judgment appear intact. Alert and cooperative with normal attention span and  concentration.  not anxious appearing and not depressed appearing.       Assessment & Plan:   Resolving rash, exam does not point to any specific etiology. Recommend observation

## 2012-03-03 ENCOUNTER — Ambulatory Visit: Payer: BC Managed Care – PPO | Admitting: Internal Medicine

## 2012-03-03 LAB — LIPID PANEL
Cholesterol: 126 mg/dL (ref 0–200)
HDL: 28 mg/dL — ABNORMAL LOW (ref >39.00)
LDL Cholesterol: 67 mg/dL (ref 0–99)
Total CHOL/HDL Ratio: 5
Triglycerides: 154 mg/dL — ABNORMAL HIGH (ref 0.0–149.0)
VLDL: 30.8 mg/dL (ref 0.0–40.0)

## 2012-03-03 LAB — ALT: ALT: 23 U/L (ref 0–53)

## 2012-03-03 LAB — AST: AST: 22 U/L (ref 0–37)

## 2012-03-19 ENCOUNTER — Telehealth: Payer: Self-pay | Admitting: Internal Medicine

## 2012-03-19 ENCOUNTER — Emergency Department (HOSPITAL_COMMUNITY): Payer: BC Managed Care – PPO

## 2012-03-19 ENCOUNTER — Observation Stay (HOSPITAL_COMMUNITY)
Admission: EM | Admit: 2012-03-19 | Discharge: 2012-03-20 | DRG: 814 | Disposition: A | Discharge status: Home / Self Care | Payer: BC Managed Care – PPO | Attending: Family Medicine | Admitting: Family Medicine

## 2012-03-19 ENCOUNTER — Encounter (HOSPITAL_COMMUNITY): Payer: Self-pay

## 2012-03-19 DIAGNOSIS — E785 Hyperlipidemia, unspecified: Secondary | ICD-10-CM

## 2012-03-19 DIAGNOSIS — R197 Diarrhea, unspecified: Secondary | ICD-10-CM

## 2012-03-19 DIAGNOSIS — G4733 Obstructive sleep apnea (adult) (pediatric): Secondary | ICD-10-CM | POA: Diagnosis present

## 2012-03-19 DIAGNOSIS — M2550 Pain in unspecified joint: Secondary | ICD-10-CM

## 2012-03-19 DIAGNOSIS — R5381 Other malaise: Secondary | ICD-10-CM

## 2012-03-19 DIAGNOSIS — K219 Gastro-esophageal reflux disease without esophagitis: Secondary | ICD-10-CM | POA: Diagnosis present

## 2012-03-19 DIAGNOSIS — J309 Allergic rhinitis, unspecified: Secondary | ICD-10-CM

## 2012-03-19 DIAGNOSIS — F341 Dysthymic disorder: Secondary | ICD-10-CM

## 2012-03-19 DIAGNOSIS — R42 Dizziness and giddiness: Principal | ICD-10-CM | POA: Insufficient documentation

## 2012-03-19 DIAGNOSIS — R112 Nausea with vomiting, unspecified: Secondary | ICD-10-CM

## 2012-03-19 DIAGNOSIS — R7303 Prediabetes: Secondary | ICD-10-CM

## 2012-03-19 DIAGNOSIS — Z0000 Encounter for general adult medical examination without abnormal findings: Secondary | ICD-10-CM

## 2012-03-19 DIAGNOSIS — Z79899 Other long term (current) drug therapy: Secondary | ICD-10-CM

## 2012-03-19 DIAGNOSIS — K5289 Other specified noninfective gastroenteritis and colitis: Secondary | ICD-10-CM | POA: Diagnosis present

## 2012-03-19 DIAGNOSIS — I10 Essential (primary) hypertension: Secondary | ICD-10-CM | POA: Diagnosis present

## 2012-03-19 DIAGNOSIS — R202 Paresthesia of skin: Secondary | ICD-10-CM

## 2012-03-19 DIAGNOSIS — M549 Dorsalgia, unspecified: Secondary | ICD-10-CM

## 2012-03-19 DIAGNOSIS — G4700 Insomnia, unspecified: Secondary | ICD-10-CM

## 2012-03-19 DIAGNOSIS — K649 Unspecified hemorrhoids: Secondary | ICD-10-CM | POA: Insufficient documentation

## 2012-03-19 DIAGNOSIS — R7309 Other abnormal glucose: Secondary | ICD-10-CM | POA: Insufficient documentation

## 2012-03-19 DIAGNOSIS — K529 Noninfective gastroenteritis and colitis, unspecified: Secondary | ICD-10-CM

## 2012-03-19 DIAGNOSIS — F419 Anxiety disorder, unspecified: Secondary | ICD-10-CM

## 2012-03-19 DIAGNOSIS — R030 Elevated blood-pressure reading, without diagnosis of hypertension: Secondary | ICD-10-CM

## 2012-03-19 DIAGNOSIS — E348 Other specified endocrine disorders: Secondary | ICD-10-CM

## 2012-03-19 DIAGNOSIS — K6289 Other specified diseases of anus and rectum: Secondary | ICD-10-CM | POA: Diagnosis present

## 2012-03-19 DIAGNOSIS — G4710 Hypersomnia, unspecified: Secondary | ICD-10-CM

## 2012-03-19 DIAGNOSIS — N200 Calculus of kidney: Secondary | ICD-10-CM

## 2012-03-19 DIAGNOSIS — H5500 Unspecified nystagmus: Secondary | ICD-10-CM | POA: Diagnosis present

## 2012-03-19 DIAGNOSIS — G4730 Sleep apnea, unspecified: Secondary | ICD-10-CM

## 2012-03-19 DIAGNOSIS — R39198 Other difficulties with micturition: Secondary | ICD-10-CM

## 2012-03-19 DIAGNOSIS — R51 Headache: Secondary | ICD-10-CM

## 2012-03-19 DIAGNOSIS — R109 Unspecified abdominal pain: Secondary | ICD-10-CM | POA: Insufficient documentation

## 2012-03-19 DIAGNOSIS — R300 Dysuria: Secondary | ICD-10-CM | POA: Diagnosis present

## 2012-03-19 HISTORY — DX: Cerebral cysts: G93.0

## 2012-03-19 HISTORY — DX: Dizziness and giddiness: R42

## 2012-03-19 HISTORY — DX: Other specified postprocedural states: R11.2

## 2012-03-19 HISTORY — DX: Nausea with vomiting, unspecified: Z98.890

## 2012-03-19 LAB — TROPONIN I
Troponin I: <0.3 ng/mL (ref <0.30)
Troponin I: <0.3 ng/mL (ref <0.30)

## 2012-03-19 LAB — CBC WITH DIFFERENTIAL/PLATELET
Basophils Absolute: 0 10*3/uL (ref 0.0–0.1)
Basophils Relative: 0 % (ref 0–1)
Eosinophils Absolute: 0 10*3/uL (ref 0.0–0.7)
Eosinophils Relative: 0 % (ref 0–5)
HCT: 42.7 % (ref 39.0–52.0)
Hemoglobin: 14.5 g/dL (ref 13.0–17.0)
Lymphocytes Relative: 12 % (ref 12–46)
Lymphs Abs: 1.5 10*3/uL (ref 0.7–4.0)
MCH: 29.1 pg (ref 26.0–34.0)
MCHC: 34 g/dL (ref 30.0–36.0)
MCV: 85.7 fL (ref 78.0–100.0)
Monocytes Absolute: 0.8 10*3/uL (ref 0.1–1.0)
Monocytes Relative: 6 % (ref 3–12)
Neutro Abs: 10.1 10*3/uL — ABNORMAL HIGH (ref 1.7–7.7)
Neutrophils Relative %: 82 % — ABNORMAL HIGH (ref 43–77)
Platelets: 338 10*3/uL (ref 150–400)
RBC: 4.98 MIL/uL (ref 4.22–5.81)
RDW: 12.3 % (ref 11.5–15.5)
WBC: 12.3 10*3/uL — ABNORMAL HIGH (ref 4.0–10.5)

## 2012-03-19 LAB — BASIC METABOLIC PANEL WITH GFR
BUN: 16 mg/dL (ref 6–23)
CO2: 25 meq/L (ref 19–32)
Calcium: 9.3 mg/dL (ref 8.4–10.5)
Chloride: 98 meq/L (ref 96–112)
Creatinine, Ser: 0.96 mg/dL (ref 0.50–1.35)
GFR calc Af Amer: >90 mL/min
GFR calc non Af Amer: >90 mL/min
Glucose, Bld: 140 mg/dL — ABNORMAL HIGH (ref 70–99)
Potassium: 3.6 meq/L (ref 3.5–5.1)
Sodium: 136 meq/L (ref 135–145)

## 2012-03-19 MED ORDER — ONDANSETRON HCL 4 MG/2ML IJ SOLN: 4.0000 mg | Freq: Four times a day (QID) | INTRAMUSCULAR | Status: DC | PRN

## 2012-03-19 MED ORDER — DIAZEPAM 5 MG PO TABS
5.0000 mg | ORAL_TABLET | Freq: Three times a day (TID) | ORAL | Status: DC
  Administered 2012-03-19 – 2012-03-20 (×3): 5 mg via ORAL
  Filled 2012-03-19 (×3): qty 1

## 2012-03-19 MED ORDER — HYDROXYZINE HCL 25 MG PO TABS
25.0000 mg | ORAL_TABLET | Freq: Every day | ORAL | Status: DC
  Administered 2012-03-19: 25 mg via ORAL
  Filled 2012-03-19 (×2): qty 1

## 2012-03-19 MED ORDER — MECLIZINE HCL 25 MG PO TABS
25.0000 mg | ORAL_TABLET | Freq: Once | ORAL | Status: AC
  Administered 2012-03-19: 25 mg via ORAL
  Filled 2012-03-19: qty 1

## 2012-03-19 MED ORDER — METOPROLOL TARTRATE 12.5 MG HALF TABLET
12.5000 mg | ORAL_TABLET | Freq: Two times a day (BID) | ORAL | Status: DC
  Administered 2012-03-19 – 2012-03-20 (×2): 12.5 mg via ORAL
  Filled 2012-03-19 (×3): qty 1

## 2012-03-19 MED ORDER — ONDANSETRON HCL 4 MG/2ML IJ SOLN
4.0000 mg | Freq: Once | INTRAMUSCULAR | Status: AC
  Administered 2012-03-19: 4 mg via INTRAVENOUS
  Filled 2012-03-19: qty 2

## 2012-03-19 MED ORDER — RANITIDINE HCL 150 MG/10ML PO SYRP: 150.0000 mg | ORAL_SOLUTION | Freq: Two times a day (BID) | ORAL | Status: DC | PRN

## 2012-03-19 MED ORDER — MECLIZINE HCL 25 MG PO TABS
25.0000 mg | ORAL_TABLET | Freq: Three times a day (TID) | ORAL | Status: DC
  Administered 2012-03-19 – 2012-03-20 (×3): 25 mg via ORAL
  Filled 2012-03-19 (×4): qty 1

## 2012-03-19 MED ORDER — HYDROCORTISONE ACE-PRAMOXINE 2.5-1 % RE CREA: 1.0000 "application " | TOPICAL_CREAM | Freq: Two times a day (BID) | RECTAL | Status: DC | PRN

## 2012-03-19 MED ORDER — FLUTICASONE PROPIONATE 50 MCG/ACT NA SUSP: 2.0000 | Freq: Every day | NASAL | Status: DC | PRN

## 2012-03-19 MED ORDER — SODIUM CHLORIDE 0.9 % IV BOLUS (SEPSIS)
500.0000 mL | Freq: Once | INTRAVENOUS | Status: AC
  Administered 2012-03-19: 500 mL via INTRAVENOUS

## 2012-03-19 MED ORDER — SODIUM CHLORIDE 0.9 % IV SOLN
INTRAVENOUS | Status: DC
  Administered 2012-03-19 – 2012-03-20 (×2): via INTRAVENOUS

## 2012-03-19 MED ORDER — VENLAFAXINE HCL ER 37.5 MG PO CP24
37.5000 mg | ORAL_CAPSULE | Freq: Every day | ORAL | Status: DC
  Administered 2012-03-19 – 2012-03-20 (×2): 37.5 mg via ORAL
  Filled 2012-03-19 (×2): qty 1

## 2012-03-19 MED ORDER — DIAZEPAM 5 MG/ML IJ SOLN
5.0000 mg | Freq: Once | INTRAMUSCULAR | Status: AC
  Administered 2012-03-19: 5 mg via INTRAVENOUS
  Filled 2012-03-19: qty 2

## 2012-03-19 MED ORDER — ONDANSETRON HCL 4 MG PO TABS: 4.0000 mg | ORAL_TABLET | Freq: Four times a day (QID) | ORAL | Status: DC | PRN

## 2012-03-19 MED ORDER — SODIUM CHLORIDE 0.9 % IJ SOLN: 3.0000 mL | Freq: Two times a day (BID) | INTRAMUSCULAR | Status: DC

## 2012-03-19 MED ORDER — ALLOPURINOL 100 MG PO TABS
100.0000 mg | ORAL_TABLET | Freq: Every day | ORAL | Status: DC
  Administered 2012-03-19 – 2012-03-20 (×2): 100 mg via ORAL
  Filled 2012-03-19 (×2): qty 1

## 2012-03-19 MED ORDER — HYDROCODONE-ACETAMINOPHEN 7.5-500 MG/15ML PO SOLN
15.0000 mL | ORAL | Status: DC | PRN
  Administered 2012-03-19: 15 mL via ORAL
  Filled 2012-03-19: qty 15

## 2012-03-19 NOTE — ED Notes (Signed)
Pt stated that he forgot to tell the nurse that he also feels pressure in ears and sinuses.

## 2012-03-19 NOTE — ED Provider Notes (Signed)
History     CSN: [id number]  Arrival date & time 03/19/12  1045   First MD Initiated Contact with Patient 03/19/12 1156      Chief Complaint  Patient presents with  . Dizziness  . Nausea  . Emesis  . Diarrhea    (Consider location/radiation/quality/duration/timing/severity/associated sxs/prior treatment) Patient is a 51 y.o. male presenting with vomiting and diarrhea. The history is provided by the patient.  Emesis  This is a new problem. Associated symptoms include abdominal pain and diarrhea. Pertinent negatives include no chills.  Diarrhea The primary symptoms include abdominal pain, nausea, vomiting and diarrhea. Primary symptoms do not include fatigue or rash.  The illness does not include chills or back pain.   patient is acute onset of dizziness or feeling of the room spinning. Began last night. He's had nausea and vomiting and some diarrhea with it. He also has chest and abdominal pain. No trouble hearing. No trouble seeing, however he states that he sees things moving. He states that his been having difficulty walking. He states he has a pressure in his head. He has a history of headaches, but states this does not feel like that. He's had no other bleeding. Is not on aspirin.  Past Medical History  Diagnosis Date  . Allergy   . Stroke   . Sleep apnea   . Headache   . Arthritis   . GERD (gastroesophageal reflux disease)   . Hypertension   . Hyperlipidemia   . Hearing loss   . Nasal congestion   . Trouble swallowing   . Blood in stool   . Chest pain   . Leg swelling   . Constipation   . Rectal pain   . Difficulty urinating   . Thrombosed hemorrhoids   . Brain cyst   . PONV (postoperative nausea and vomiting)     Past Surgical History  Procedure Date  . Cataract extraction     x 3  . Inner ear surgery     rt ear  . Eye surgery 1968, 1985, 1987    cataracts  . Inner ear surgery 1992  . Surgery - right arm     1983    Family History  Problem  Relation Age of Onset  . Cancer Mother 37    breast  . Stroke Father   . Heart disease Father     History  Substance Use Topics  . Smoking status: Never Smoker   . Smokeless tobacco: Never Used  . Alcohol Use: No      Review of Systems  Constitutional: Positive for appetite change. Negative for chills and fatigue.  HENT: Negative for hearing loss and tinnitus.   Eyes: Positive for visual disturbance.  Respiratory: Negative for choking.   Cardiovascular: Positive for chest pain.  Gastrointestinal: Positive for nausea, vomiting, abdominal pain, diarrhea and blood in stool.  Genitourinary: Negative for flank pain.  Musculoskeletal: Negative for back pain.  Skin: Negative for rash and wound.  Neurological: Negative for syncope.    Allergies  Amoxicillin and Terfenadine  Home Medications   Current Outpatient Rx  Name Route Sig Dispense Refill  . ALLOPURINOL 100 MG PO TABS Oral Take 1 tablet (100 mg total) by mouth daily. 90 tablet 1  . ATORVASTATIN CALCIUM 10 MG PO TABS Oral Take 10 mg by mouth daily.    . AZELASTINE HCL 137 MCG/SPRAY NA SOLN Nasal Place 2 sprays into the nose 2 (two) times daily. Use in each nostril as directed  30 mL 1  . DIAZEPAM 5 MG PO TABS Oral Take 1 tablet (5 mg total) by mouth 3 (three) times daily. 90 tablet 1  . FLUTICASONE PROPIONATE 50 MCG/ACT NA SUSP Nasal Place 2 sprays into the nose daily as needed. For allergies.    Marland Kitchen HYDROCODONE-ACETAMINOPHEN 7.5-500 MG/15ML PO SOLN Oral Take 15 mLs by mouth 4 (four) times daily as needed. For pain.    Marland Kitchen HYDROCORTISONE ACE-PRAMOXINE 2.5-1 % RE CREA Rectal Place 1 application rectally 2 (two) times daily as needed. For hemorroids.    Marland Kitchen HYDROXYZINE HCL 25 MG PO TABS Oral Take 25 mg by mouth at bedtime.    Marland Kitchen RANITIDINE HCL 150 MG/10ML PO SYRP Oral Take 150 mg by mouth 2 (two) times daily as needed. For indigestion.    . VENLAFAXINE HCL ER 37.5 MG PO CP24 Oral Take 37.5 mg by mouth daily.    Marland Kitchen MECLIZINE HCL 25 MG  PO TABS Oral Take 1 tablet (25 mg total) by mouth 3 (three) times daily. 30 tablet 0  . METOPROLOL TARTRATE 25 MG PO TABS Oral Take 0.5 tablets (12.5 mg total) by mouth 2 (two) times daily.    Marland Kitchen PROMETHAZINE HCL 25 MG RE SUPP Rectal Place 1 suppository (25 mg total) rectally every 6 (six) hours as needed for nausea. 12 each 0    BP 140/87  Pulse 85  Temp 97.8 F (36.6 C) (Oral)  Resp 18  Ht 5\' 11"  (1.803 m)  Wt 195 lb (88.451 kg)  BMI 27.20 kg/m2  SpO2 100%  Physical Exam  Constitutional: He appears well-developed and well-nourished.  HENT:  Head: Normocephalic.  Eyes: Pupils are equal, round, and reactive to light.       Patient with horizontal nystagmus, worse with looking to right. Extraocular movements otherwise intact.  Cardiovascular: Normal rate.   Pulmonary/Chest: Effort normal.  Abdominal: Soft.  Musculoskeletal: Normal range of motion.  Neurological: He is alert.       Finger-nose appears to be intact, maybe mildly slowed. Good grip strength bilaterally. Face is symmetric. Nystagmus on right gaze.  Skin: Skin is warm.    ED Course  Procedures (including critical care time)  Labs Reviewed  CBC WITH DIFFERENTIAL - Abnormal; Notable for the following:    WBC 12.3 (*)     Neutrophils Relative 82 (*)     Neutro Abs 10.1 (*)     All other components within normal limits  BASIC METABOLIC PANEL - Abnormal; Notable for the following:    Glucose, Bld 140 (*)     All other components within normal limits  COMPREHENSIVE METABOLIC PANEL - Abnormal; Notable for the following:    Glucose, Bld 120 (*)     GFR calc non Af Amer 73 (*)     GFR calc Af Amer 84 (*)     All other components within normal limits  GLUCOSE, CAPILLARY - Abnormal; Notable for the following:    Glucose-Capillary 129 (*)     All other components within normal limits  TROPONIN I  TROPONIN I  CBC  LAB REPORT - SCANNED   Mr Brain Wo Contrast  03/19/2012  *RADIOLOGY REPORT*  Clinical Data: Vertigo.   Nausea.  MRI HEAD WITHOUT CONTRAST  Technique:  Multiplanar, multiecho pulse sequences of the brain and surrounding structures were obtained according to standard protocol without intravenous contrast.  Comparison: 10/17/2011  Findings: Diffusion imaging does not show any acute or subacute infarction.  The brainstem and cerebellum are unremarkable.  The cerebral hemispheres show mild chronic small vessel disease within the deep white matter and the basal ganglia.  No cortical or large vessel territory infarction.  No mass lesion, hemorrhage, hydrocephalus or extra-axial collection.  No fluid in the sinuses, middle ears or mastoids.  No pituitary abnormality.  No skull or skull base lesion.  Pineal cystic abnormality appears the same as on previous studies, not completely evaluated without contrast today.  IMPRESSION: No acute finding.  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   Original Report Authenticated By: Thomasenia Sales, M.D.    Dg Abd Acute W/chest  03/19/2012  *RADIOLOGY REPORT*  Clinical Data: Abdominal pain.  Black tarry stools.  ACUTE ABDOMEN SERIES (ABDOMEN 2 VIEW & CHEST 1 VIEW)  Comparison: Abdominal CT 11/19/2003  Findings: Chest radiograph demonstrates clear lungs.  Heart and mediastinum are within normal limits.  Trachea is midline.  There is no evidence for free air.  A small amount of bowel gas throughout the abdomen.  Nonobstructive bowel gas pattern.  Small calcifications in the pelvis are suggestive for phleboliths.  There is a 4 mm calcification in the region of the right kidney which could represent a small stone.  IMPRESSION: Nonspecific bowel gas pattern.  Possible small right kidney stone.  No acute chest findings.   Original Report Authenticated By: Richarda Overlie, M.D.      1. Vertigo   2. Nausea vomiting and diarrhea   3. Anxiety   4. HYPERLIPIDEMIA   5. Dysuria   6. Dizziness - light-headed   7. Gastroenteritis   8. Pineal gland cyst    9. ANXIETY DEPRESSION   10. ALLERGIC RHINITIS   11. GERD   12. NEPHROLITHIASIS   13. BACK PAIN   14. SYMPTOM, HYPERSOMNIA NOS   15. FATIGUE   16. Headache   17. ELEVATED BP READING WITHOUT DX HYPERTENSION   18. Arthralgia   19. Paresthesia   20. Difficulty urinating   21. Insomnia   22. Sleep apnea   23. Borderline diabetes   24. Annual physical exam       MDM  Patient with vertigo, NVD. And some blood in the stool. Central stroke ruled out. Unable to ambulate. admitted        Juliet Rude. Rubin Payor, MD 03/21/12 [id number]

## 2012-03-19 NOTE — ED Notes (Signed)
Patient transported to MRI 

## 2012-03-19 NOTE — H&P (Signed)
[facility] History and Physical  MALACHAI SCHALK ZOX:[id number] DOB: [birth year] DOA: 03/19/2012  Referring physician: Rubin Payor PCP: Willow Ora, MD  Specialists: None   Chief Complaint: Vertigo  HPI: Chase [name] is a 51 y.o. male who presented to [facility] because of dizzyness which started about 22:00 on the pm of 10/3 and increased frequency of stools, which were darker in appearance than usual. He states that he was actually outside of the house taking the trash out and when he came back from outside and went to the front door of his house he felt very dizzy. He qualifies as having spinning sensation and then had some nausea and vomiting in addition to this. The nausea and vomiting were not bloody or bilious however he had 2 episodes of this. He states he's had this similar complaint about 30 years ago and was on meclizine for this. He states that he has some L. pain in his lower abdomen radiating around to his back but no other real issue. He states that his stool is normally dark he was unable to observe how dark it was this time around. He denies any specific chest pain any blurred vision any double vision although his right eye does have a chronic nystagmus he saying that he occasionally is having a now on the left side as of yesterday 03/18/12. He has not eaten anything today and did not feel ED yesterday because of the dizziness. He called Dr. Leta Jungling office and was told to come to the emergency room because of his dark stool a dizzy sensation.  Review of Systems: Negative except as per above    Past Medical History  Diagnosis Date  . Allergy   . Stroke   . Sleep apnea   . Headache   . Arthritis   . GERD (gastroesophageal reflux disease)   . Hypertension   . Hyperlipidemia   . Hearing loss   . Nasal congestion   . Trouble swallowing   . Blood in stool   . Chest pain   . Leg swelling   . Constipation   . Rectal pain   . Difficulty urinating   . Thrombosed hemorrhoids   .  Brain cyst    Chart review  Admission 11/2003 for R ureterc calculi  Seen at [facility] for OSA-h/o multiple tympanotomy tubes for Vertigo dysfunction on that admit  Seens by [facility] for Gi bleeding-colonsocopy reported in 2006 at [place]-Pt decided to go to [facility] b/c he states it was 300 dollars cheaper for him  Lexiscan 02/12/10-Normal stress nuclear study with mild apical thinning but no ischemia.  History of pituitary adenoma his last MRI was in May of 2011   Past Surgical History  Procedure Date  . Cataract extraction     x 3  . Inner ear surgery     rt ear  . Eye surgery 1968, 1985, 1987    cataracts  . Inner ear surgery 1992  . Surgery - right arm     1983   Social History:  reports that he has never smoked. He has never used smokeless tobacco. He reports that he does not drink alcohol or use illicit drugs. Patient lives at home with his girlfriend.  Can participate in  Allergies  Allergen Reactions  . Amoxicillin Hives, Itching and Other (See Comments)    White tongue; ? hives  . Terfenadine     ? reaction    Family History  Problem Relation  Age of Onset  . Cancer Mother 13    breast  . Stroke Father   . Heart disease Father      Prior to Admission medications   Medication Sig Start Date End Date Taking? Authorizing Provider  allopurinol (ZYLOPRIM) 100 MG tablet Take 1 tablet (100 mg total) by mouth daily. 03/02/12 03/02/13 Yes Wanda Plump, MD  atorvastatin (LIPITOR) 10 MG tablet Take 10 mg by mouth daily.   Yes Historical Provider, MD  azelastine (ASTELIN) 137 MCG/SPRAY nasal spray Place 2 sprays into the nose 2 (two) times daily. Use in each nostril as directed 03/02/12  Yes Wanda Plump, MD  diazepam (VALIUM) 5 MG tablet Take 1 tablet (5 mg total) by mouth 3 (three) times daily. 03/01/12 03/01/13 Yes Wanda Plump, MD  fluticasone (FLONASE) 50 MCG/ACT nasal spray Place 2 sprays into the nose daily as needed. For allergies.   Yes  Historical Provider, MD  HYDROcodone-acetaminophen (LORTAB) 7.5-500 MG/15ML solution Take 15 mLs by mouth 4 (four) times daily as needed. For pain.   Yes Historical Provider, MD  hydrocortisone-pramoxine [facility]) 2.5-1 % rectal cream Place 1 application rectally 2 (two) times daily as needed. For hemorroids. 05/28/11  Yes Wanda Plump, MD  hydrOXYzine (ATARAX/VISTARIL) 25 MG tablet Take 25 mg by mouth at bedtime.   Yes Historical Provider, MD  metoprolol tartrate (LOPRESSOR) 25 MG tablet Take 25 mg by mouth 2 (two) times daily.   Yes Historical Provider, MD  naproxen (NAPROSYN) 125 MG/5ML suspension Take 20 mLs (500 mg total) by mouth 2 (two) times daily. 03/02/12  Yes Wanda Plump, MD  ranitidine (ZANTAC) 150 MG/10ML syrup Take 150 mg by mouth 2 (two) times daily as needed. For indigestion.   Yes Historical Provider, MD  venlafaxine XR (EFFEXOR-XR) 37.5 MG 24 hr capsule Take 37.5 mg by mouth daily.   Yes Historical Provider, MD   Physical Exam: Filed Vitals:   03/19/12 1051  BP: 142/88  Pulse: 97  Temp: 98.5 F (36.9 C)  TempSrc: Oral  Resp: 16  SpO2: 98%     General:  Alert oriented Caucasian male, slight nystagmus to the right eye, face she'll folds are symmetric smile is symmetric  Eyes:  nystagmus beats with rapid saccades to the right eye on testing no pallor or icterus  ENT: Clear  Neck: Soft supple nontender nondistended  Cardiovascular: S1-S2 no murmur rub or gallop slightly tachycardic  Respiratory: Clinically clear  Abdomen: A little tender in the left lower quadrant  Skin: No rash or edema  Musculoskeletal: Grossly normal  Psychiatric: Flat affect and slightly irritated  Neurologic: Clear nerves II through XII grossly intact. Power is 5 out 5 bilaterally. Reflexes 2/3 in biceps brachioradialis and triceps as well as knees. Power once again normal.  Labs on Admission:  Basic Metabolic Panel:  Lab 03/19/12 2725  NA 136  K 3.6  CL 98  CO2 25  GLUCOSE 140*    BUN 16  CREATININE 0.96  CALCIUM 9.3  MG --  PHOS --   Liver Function Tests: No results found for this basename: AST:5,ALT:5,ALKPHOS:5,BILITOT:5,PROT:5,ALBUMIN:5 in the last 168 hours No results found for this basename: LIPASE:5,AMYLASE:5 in the last 168 hours No results found for this basename: AMMONIA:5 in the last 168 hours CBC:  Lab 03/19/12 1250  WBC 12.3*  NEUTROABS 10.1*  HGB 14.5  HCT 42.7  MCV 85.7  PLT 338   Cardiac Enzymes:  Lab 03/19/12 1250  CKTOTAL --  CKMB --  CKMBINDEX --  TROPONINI <0.30    BNP (last 3 results) No results found for this basename: PROBNP:3 in the last 8760 hours CBG: No results found for this basename: GLUCAP:5 in the last 168 hours  Radiological Exams on Admission: Mr Brain Wo Contrast  03/19/2012  *RADIOLOGY REPORT*  Clinical Data: Vertigo.  Nausea.  MRI HEAD WITHOUT CONTRAST  Technique:  Multiplanar, multiecho pulse sequences of the brain and surrounding structures were obtained according to standard protocol without intravenous contrast.  Comparison: 10/17/2011  Findings: Diffusion imaging does not show any acute or subacute infarction.  The brainstem and cerebellum are unremarkable.  The cerebral hemispheres show mild chronic small vessel disease within the deep white matter and the basal ganglia.  No cortical or large vessel territory infarction.  No mass lesion, hemorrhage, hydrocephalus or extra-axial collection.  No fluid in the sinuses, middle ears or mastoids.  No pituitary abnormality.  No skull or skull base lesion.  Pineal cystic abnormality appears the same as on previous studies, not completely evaluated without contrast today.  IMPRESSION: No acute finding.  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   Original Report Authenticated By: Thomasenia Sales, M.D.    Dg Abd Acute W/chest  03/19/2012  *RADIOLOGY REPORT*  Clinical Data: Abdominal pain.  Black tarry stools.  ACUTE ABDOMEN SERIES  (ABDOMEN 2 VIEW & CHEST 1 VIEW)  Comparison: Abdominal CT 11/19/2003  Findings: Chest radiograph demonstrates clear lungs.  Heart and mediastinum are within normal limits.  Trachea is midline.  There is no evidence for free air.  A small amount of bowel gas throughout the abdomen.  Nonobstructive bowel gas pattern.  Small calcifications in the pelvis are suggestive for phleboliths.  There is a 4 mm calcification in the region of the right kidney which could represent a small stone.  IMPRESSION: Nonspecific bowel gas pattern.  Possible small right kidney stone.  No acute chest findings.   Original Report Authenticated By: Richarda Overlie, M.D.     EKG: Independently reviewed. None performed  Assessment/Plan Principal Problem:  *Dizziness - light-headed Active Problems:  HYPERLIPIDEMIA  Dysuria  Gastroenteritis   1. Dizziness-this patient has likely peripheral vertigo and has been trialed on multiple agents including diazepam meclizine with difficulty in him ambulating. I did Epley's maneuver and Dix-Hallpike eat which seem to reproduce the same. Given the fact that he cannot safely ambulate and feels dizzy still and has been taking by mouth I will observe him overnight get up OT/PT therapy for rest of the training and potentially discharge him tomorrow-keep on IV fluids get orthostatics 2. Gastroenteritis?-Patient has dark stools which are chronic. His hemoglobin today is at the same level as it has been in the past. The repeat CBC in the morning. He has a mild leukocytosis and this may be from vomiting but may also be gastroenteritis in origin as he states he's had contact with person who has been sick. We will review him in the morning with regards to the same  Code Status: Full  Family Communication: Discussed with girlfriend at bedside (i Disposition Plan: Observation to telemetry expect discharge one to 2 days   Time spent: 40 minutes  Mahala Menghini [facility] Pager  [phone]  If 7PM-7AM, please contact night-coverage [email or web address] Password [email or web address] 03/19/2012, 4:24 PM

## 2012-03-19 NOTE — Telephone Encounter (Signed)
C/o black -tarry stools and dizzines--- needs ER eval

## 2012-03-19 NOTE — Telephone Encounter (Signed)
Pt was seen at ED

## 2012-03-19 NOTE — ED Notes (Signed)
Unable to ambulate pt in hallway, assisted pt to [facility], pt very dizzy upon standing and ambulation to [facility]

## 2012-03-19 NOTE — ED Notes (Signed)
Pt also c/o sinus pressure

## 2012-03-19 NOTE — ED Notes (Signed)
Started midnight, pt started to have dizziness, vertigo, weird feeling in his head, nausea, vomiting and diarrhea.  Room is spinning, cannot walk. 2 occurences of emesis since midnight. Now also reports black stool.

## 2012-03-19 NOTE — Telephone Encounter (Signed)
Caller: [name]/Patient; Patient Name: Chase [name]; PCP: Willow Ora; Best Callback Phone Number: [phone]; Reason for call: Patient states since 12 midnight 03/19/12  . Sudden onset.  He has been dizzy, "spinning moving, shaking" while taking out trash.  Sat on couch and everything was spinning.   Black tarry stools, diarrhea, coughing , sore throat, ear pain. Emesis x 2 .  Blood unsure in emesis.  He states his pressure 150/110.  HX TIA, HX seizure in past.  Speech slow and quiet.   Patient states on Metoprolol  and non compliant. Patient hard to follow . Emergent s/sx ruled out per Dizziness and Vertigo with exception to Passing black or tarry material from rectum and onset of s/sx hypovolemia".  See ED now. Spoke with Lillia Abed in office and updated. She spoke with Dr. Drue Novel and recommendation to go to the ER.  Discussed importance of being seen with patient. Encouraged him to call ambulance if needed due to dizziness. Understanding expressed.

## 2012-03-19 NOTE — ED Notes (Signed)
Floor RN unable to take report at this time.

## 2012-03-19 NOTE — ED Notes (Signed)
Pt has both internal and external hemorrhoids.

## 2012-03-19 NOTE — ED Notes (Signed)
Pt c/o n/v/d since midnight last night, hasn't been able to eat anything since 9 pm last night, pt c/o right mid abd pain tenderness to touch, pt states that his entire abdomen hurts but it is worst on right side.

## 2012-03-20 LAB — CBC
HCT: 40.7 % (ref 39.0–52.0)
Hemoglobin: 13.8 g/dL (ref 13.0–17.0)
MCH: 29.8 pg (ref 26.0–34.0)
MCHC: 33.9 g/dL (ref 30.0–36.0)
MCV: 87.9 fL (ref 78.0–100.0)
Platelets: 286 10*3/uL (ref 150–400)
RBC: 4.63 MIL/uL (ref 4.22–5.81)
RDW: 12.6 % (ref 11.5–15.5)
WBC: 9.7 10*3/uL (ref 4.0–10.5)

## 2012-03-20 LAB — COMPREHENSIVE METABOLIC PANEL
ALT: 15 U/L (ref 0–53)
AST: 14 U/L (ref 0–37)
Albumin: 3.6 g/dL (ref 3.5–5.2)
Alkaline Phosphatase: 63 U/L (ref 39–117)
BUN: 14 mg/dL (ref 6–23)
CO2: 27 mEq/L (ref 19–32)
Calcium: 8.7 mg/dL (ref 8.4–10.5)
Chloride: 104 mEq/L (ref 96–112)
Creatinine, Ser: 1.14 mg/dL (ref 0.50–1.35)
GFR calc Af Amer: 84 mL/min — ABNORMAL LOW (ref >90)
GFR calc non Af Amer: 73 mL/min — ABNORMAL LOW (ref >90)
Glucose, Bld: 120 mg/dL — ABNORMAL HIGH (ref 70–99)
Potassium: 3.8 mEq/L (ref 3.5–5.1)
Sodium: 138 mEq/L (ref 135–145)
Total Bilirubin: 0.6 mg/dL (ref 0.3–1.2)
Total Protein: 6.3 g/dL (ref 6.0–8.3)

## 2012-03-20 LAB — GLUCOSE, CAPILLARY: Glucose-Capillary: 129 mg/dL — ABNORMAL HIGH (ref 70–99)

## 2012-03-20 MED ORDER — PROMETHAZINE HCL 25 MG RE SUPP: 25.0000 mg | Freq: Four times a day (QID) | RECTAL | Status: DC | PRN

## 2012-03-20 MED ORDER — MECLIZINE HCL 25 MG PO TABS: 25.0000 mg | ORAL_TABLET | Freq: Three times a day (TID) | ORAL | Status: DC

## 2012-03-20 MED ORDER — METOPROLOL TARTRATE 25 MG PO TABS: 12.5000 mg | ORAL_TABLET | Freq: Two times a day (BID) | ORAL | Status: DC

## 2012-03-20 NOTE — Evaluation (Signed)
Physical Therapy Evaluation Patient Details Name: Chase [name] MRN: [id number] DOB: 1960/09/25 Today's Date: 03/20/2012 Time: 1102-1200 PT Time Calculation (min): 58 min  PT Assessment / Plan / Recommendation Clinical Impression  Patient presents with complicated PMH significant for multiple ear procedures with ear pressure, hearing aides, Raynaud's, TIA's and pineal cyst.  Symptoms are more consistent with peripheral vertigo, though there are some positive central signs.  Patient will benefit from continuing vestibular rehab in the outpatient setting.      PT Assessment  Patient needs continued PT services    Follow Up Recommendations  Outpatient PT          Barriers to Discharge        Equipment Recommendations  None recommended by PT    Recommendations for Other Services   Outpatient neurorehab, follow up with neurologist  Frequency Min 3X/week    Precautions / Restrictions Precautions Precaution Comments: Fall risk (See Berg balance assessment) Restrictions Weight Bearing Restrictions: No   Pertinent Vitals/Pain C/o low back and shoulder pain 4/10      Mobility  Bed Mobility Bed Mobility: Supine to Sit Supine to Sit: 6: Modified independent (Device/Increase time) Transfers Transfers: Stand to Sit;Sit to Stand Sit to Stand: 7: Independent Stand to Sit: 7: Independent Details for Transfer Assistance: able to stand without UE support, but c/o increased stress on knees. Ambulation/Gait Ambulation/Gait Assistance: 5: Supervision (cues for compensatory strategies.) Ambulation Distance (Feet): 100 Feet Assistive device: None Ambulation/Gait Assistance Details: decreased step length, decreased trunk rotation, c/o dizziness with head turn to right up to 3/10              PT Diagnosis: Abnormality of gait (vertigo)  PT Problem List: Decreased balance;Decreased activity tolerance;Decreased mobility;Other (comment) (vertigo) PT Treatment Interventions: Balance  training;Therapeutic activities;Functional mobility training;Stair training;Patient/family education;Other (comment) (vestibular rehab)   PT Goals Acute Rehab PT Goals PT Goal Formulation: With patient Additional Goals Additional Goal #1: Patient will be able to complete HEP for vestibular adaptation independently  PT Goal: Additional Goal #1 - Progress: Goal set today Additional Goal #2: Patient will verbalize compensatory strategy for safe mobility. PT Goal: Additional Goal #2 - Progress: Goal set today  Visit Information  Last PT Received On: 03/20/12 Assistance Needed: +1    Subjective Data  Subjective: Just started with the dizziness night before last, then had lots of GI issues.  Decribes symptoms as "wooziness" though started with spinning. Patient Stated Goal: To feel better   Prior Functioning  Home Living Lives With: Significant other Available Help at Discharge: Friend(s) Type of Home: House Home Access: Stairs to enter [employer] of Steps: 3-4 Entrance Stairs-Rails: Right Home Layout: One level Additional Comments: Girlfriend works, patient currently out of work. Prior Function Level of Independence: Independent Communication Communication: HOH    Cognition  Overall Cognitive Status: Appears within functional limits for tasks assessed/performed Arousal/Alertness: Awake/alert Orientation Level: Appears intact for tasks assessed Cognition - Other Comments: Slow to respond to questions at times, sleepy initially due to lack of sleep prior to admission with onset of symptoms and was given medications off normal schedule.  Difficulty focusing at times and reports history of memory deficits since TIA's.    Extremity/Trunk Assessment Right Upper Extremity Assessment RUE ROM/Strength/Tone: Within functional levels (reports pain in shoulders with strength testing) RUE Coordination: WFL - gross motor Left Upper Extremity Assessment LUE ROM/Strength/Tone:  Within functional levels LUE Coordination: WFL - gross motor Right Lower Extremity Assessment RLE ROM/Strength/Tone: WFL for tasks assessed  RLE Coordination: WFL - gross motor Left Lower Extremity Assessment LLE ROM/Strength/Tone: WFL for tasks assessed   Balance Balance Balance Assessed: Yes Standardized Balance Assessment Standardized Balance Assessment: Berg Balance Test Berg Balance Test Sit to Stand: Able to stand without using hands and stabilize independently Standing Unsupported: Able to stand safely 2 minutes Sitting with Back Unsupported but Feet Supported on Floor or Stool: Able to sit safely and securely 2 minutes Stand to Sit: Sits safely with minimal use of hands Transfers: Able to transfer safely, minor use of hands Standing Unsupported with Eyes Closed: Able to stand 10 seconds safely Standing Ubsupported with Feet Together: Able to place feet together independently and stand for 1 minute with supervision From Standing, Reach Forward with Outstretched Arm: Can reach confidently >25 cm (10") From Standing Position, Pick up Object from Floor: Able to pick up shoe safely and easily From Standing Position, Turn to Look Behind Over each Shoulder: Looks behind from both sides and weight shifts well Turn 360 Degrees: Able to turn 360 degrees safely but slowly Standing Unsupported, Alternately Place Feet on Step/Stool: Able to complete >2 steps/needs minimal assist Standing Unsupported, One Foot in Front: Able to plae foot ahead of the other independently and hold 30 seconds Standing on One Leg: Tries to lift leg/unable to hold 3 seconds but remains standing independently Total Score: 46   End of Session PT - End of Session Equipment Utilized During Treatment: Gait belt Activity Tolerance: Patient tolerated treatment well Patient left: in bed;with call bell/phone within reach  GP     [facility] 03/20/2012, 12:51 PM

## 2012-03-20 NOTE — Progress Notes (Signed)
Occupational Therapy Discharge Patient Details Name: Chase [name] MRN: [id number] DOB: May 16, 1961 Today's Date: 03/20/2012    Patient discharged from OT services secondary to: order for PT/OT Vestibular eval which will be performed by PT. Please order OT consult if appropriate. Will sign off at this time. Thank you. Glendale Chard, OTR/L Pager: [phone] 03/20/2012        [name] 03/20/2012, 8:03 AM

## 2012-03-20 NOTE — Discharge Summary (Signed)
Physician Discharge Summary  [name] [name]:[id number] DOB: [birth year] DOA: 03/19/2012  PCP: Willow Ora, MD  Admit date: 03/19/2012 Discharge date: 03/20/2012  Recommendations for Outpatient Follow-up:  1. Needs out-patient pt/ Vestib training 2. Needs to be seen her primary care physician 2-3 days 3. Potentially needs neurology followup for multiple issues including Raynaud's phenomenon, pineal gland tumor etc.  Discharge Diagnoses:  Principal Problem:  *Dizziness - light-headed Active Problems:  HYPERLIPIDEMIA  Dysuria  Gastroenteritis   Discharge Condition: Stable  Diet recommendation:  Low-salt  Filed Weights   03/19/12 1931  Weight: 88.451 kg (195 lb)    History of present illness:  Chase [name] is a 51 y.o. male who presented to [facility] because of dizzyness which started about 22:00 on the pm of 10/3 and increased frequency of stools, which were darker in appearance than usual. He states that he was actually outside of the house taking the trash out and when he came back from outside and went to the front door of his house he felt very dizzy. He qualifies as having spinning sensation and then had some nausea and vomiting in addition to this. The nausea and vomiting were not bloody or bilious however he had 2 episodes of this. He states he's had this similar complaint about 30 years ago and was on meclizine for this. He states that he has some L. pain in his lower abdomen radiating around to his back but no other real issue.  He states that his stool is normally dark he was unable to observe how dark it was this time around.  He denies any specific chest pain any blurred vision any double vision although his right eye does have a chronic nystagmus he saying that he occasionally is having a now on the left side as of yesterday 03/18/12. He has not eaten anything today and did not feel ED yesterday because of the dizziness. He called Dr. Leta Jungling office and was told to come to the  emergency room because of his dark stool a dizzy sensation.   Hospital Course:  Patient had physical therapy and occupational therapy work with him and they recommended outpatient vestibular rehabilitation as this did confirm peripheral vertigo. He had an MRI in the hospital which did not show any acute findings but did show chronic changes in the basal ganglia. He did have slightly elevated blood sugars and should have an HbA1c in the near future I would recommend that he gets an outpatient reevaluation by gastroenterologist of his choice for his proctitis and hemorrhoids-he is seen general surgery and a gastroenterologist in the past I will attempt if possible to minimize his psychotropic medications as well He'll be given a prescription for meclizine for vertigo Telemetry did not show any specific alterations and this was discontinued  Procedures:  MRI brain 03/19/2012 = no acute findings  Acute abdominal series 10/4= nonspecific bowel gas, possible small right kidney stone  Consultations:   Occupational therapy-Patient presents with complicated PMH significant for multiple ear procedures with ear pressure, hearing aides, Raynaud's, TIA's and pineal cyst. Symptoms are more consistent with peripheral vertigo, though there are some positive central signs. Patient will benefit from continuing vestibular rehab in the outpatient setting.   Discharge Exam: Filed Vitals:   03/19/12 2238 03/19/12 2240 03/19/12 2243 03/20/12 0542  BP: 157/85 165/80 145/81 150/89  Pulse: 85 89 80 79  Temp: 97.8 F (36.6 C)   97.8 F (36.6 C)  TempSrc: Oral   Oral  Resp: 18   18  Height:      Weight:      SpO2: 96%   96%    Discharge Instructions  Discharge Orders    Future Appointments: Provider: Department: Dept Phone: Center:   03/25/2012 12:30 PM Carrolyn Leigh, RD [facility] [phone] [facility]   05/27/2012 9:15 AM Wanda Plump, MD [facility] [phone] [facility]     Future  Orders Please Complete By Expires   Diet - low sodium heart healthy      Increase activity slowly          Medication List     As of 03/20/2012  1:07 PM    STOP taking these medications         naproxen 125 MG/5ML suspension   Commonly known as: NAPROSYN      TAKE these medications         allopurinol 100 MG tablet   Commonly known as: ZYLOPRIM   Take 1 tablet (100 mg total) by mouth daily.      atorvastatin 10 MG tablet   Commonly known as: LIPITOR   Take 10 mg by mouth daily.      azelastine 137 MCG/SPRAY nasal spray   Commonly known as: ASTELIN   Place 2 sprays into the nose 2 (two) times daily. Use in each nostril as directed      diazepam 5 MG tablet   Commonly known as: VALIUM   Take 1 tablet (5 mg total) by mouth 3 (three) times daily.      fluticasone 50 MCG/ACT nasal spray   Commonly known as: FLONASE   Place 2 sprays into the nose daily as needed. For allergies.      HYDROcodone-acetaminophen 7.5-500 MG/15ML solution   Commonly known as: LORTAB   Take 15 mLs by mouth 4 (four) times daily as needed. For pain.      hydrocortisone-pramoxine 2.5-1 % rectal cream   Commonly known as: ANALPRAM-HC   Place 1 application rectally 2 (two) times daily as needed. For hemorroids.      hydrOXYzine 25 MG tablet   Commonly known as: ATARAX/VISTARIL   Take 25 mg by mouth at bedtime.      meclizine 25 MG tablet   Commonly known as: ANTIVERT   Take 1 tablet (25 mg total) by mouth 3 (three) times daily.      metoprolol tartrate 25 MG tablet   Commonly known as: LOPRESSOR   Take 0.5 tablets (12.5 mg total) by mouth 2 (two) times daily.      ranitidine 150 MG/10ML syrup   Commonly known as: ZANTAC   Take 150 mg by mouth 2 (two) times daily as needed. For indigestion.      venlafaxine XR 37.5 MG 24 hr capsule   Commonly known as: EFFEXOR-XR   Take 37.5 mg by mouth daily.           Follow-up Information    Follow up with Willow Ora, MD. In 3 days.   Contact  information:   [address] [facility] [address] [place] [address] [phone]           The results of significant diagnostics from this hospitalization (including imaging, microbiology, ancillary and laboratory) are listed below for reference.    Significant Diagnostic Studies: Mr Brain Wo Contrast  03/19/2012  *RADIOLOGY REPORT*  Clinical Data: Vertigo.  Nausea.  MRI HEAD WITHOUT CONTRAST  Technique:  Multiplanar, multiecho pulse sequences of the brain  and surrounding structures were obtained according to standard protocol without intravenous contrast.  Comparison: 10/17/2011  Findings: Diffusion imaging does not show any acute or subacute infarction.  The brainstem and cerebellum are unremarkable.  The cerebral hemispheres show mild chronic small vessel disease within the deep white matter and the basal ganglia.  No cortical or large vessel territory infarction.  No mass lesion, hemorrhage, hydrocephalus or extra-axial collection.  No fluid in the sinuses, middle ears or mastoids.  No pituitary abnormality.  No skull or skull base lesion.  Pineal cystic abnormality appears the same as on previous studies, not completely evaluated without contrast today.  IMPRESSION: No acute finding.  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   Original Report Authenticated By: Thomasenia Sales, M.D.    Dg Abd Acute W/chest  03/19/2012  *RADIOLOGY REPORT*  Clinical Data: Abdominal pain.  Black tarry stools.  ACUTE ABDOMEN SERIES (ABDOMEN 2 VIEW & CHEST 1 VIEW)  Comparison: Abdominal CT 11/19/2003  Findings: Chest radiograph demonstrates clear lungs.  Heart and mediastinum are within normal limits.  Trachea is midline.  There is no evidence for free air.  A small amount of bowel gas throughout the abdomen.  Nonobstructive bowel gas pattern.  Small calcifications in the pelvis are suggestive for phleboliths.  There is a 4 mm calcification in the region of the  right kidney which could represent a small stone.  IMPRESSION: Nonspecific bowel gas pattern.  Possible small right kidney stone.  No acute chest findings.   Original Report Authenticated By: Richarda Overlie, M.D.     Microbiology: No results found for this or any previous visit (from the past 240 hour(s)).   Labs: Basic Metabolic Panel:  Lab 03/20/12 4098 03/19/12 1250  NA 138 136  K 3.8 3.6  CL 104 98  CO2 27 25  GLUCOSE 120* 140*  BUN 14 16  CREATININE 1.14 0.96  CALCIUM 8.7 9.3  MG -- --  PHOS -- --   Liver Function Tests:  Lab 03/20/12 0605  AST 14  ALT 15  ALKPHOS 63  BILITOT 0.6  PROT 6.3  ALBUMIN 3.6   No results found for this basename: LIPASE:5,AMYLASE:5 in the last 168 hours No results found for this basename: AMMONIA:5 in the last 168 hours CBC:  Lab 03/20/12 0605 03/19/12 1250  WBC 9.7 12.3*  NEUTROABS -- 10.1*  HGB 13.8 14.5  HCT 40.7 42.7  MCV 87.9 85.7  PLT 286 338   Cardiac Enzymes:  Lab 03/19/12 1916 03/19/12 1250  CKTOTAL -- --  CKMB -- --  CKMBINDEX -- --  TROPONINI <0.30 <0.30   BNP: BNP (last 3 results) No results found for this basename: PROBNP:3 in the last 8760 hours CBG:  Lab 03/20/12 0737  GLUCAP 129*    Time coordinating discharge: 35 minutes including 15 minutes direct face-face time and duiscussion  SignedRhetta Mura  [facility] 03/20/2012, 1:07 PM

## 2012-03-20 NOTE — Progress Notes (Signed)
Physical Therapy Treatment Patient Details Name: Chase [name] MRN: [id number] DOB: July 17, 1960 Today's Date: 03/20/2012 Time: 3086-5784 PT Time Calculation (min): 39 min  PT Assessment / Plan / Recommendation Comments on Treatment Session  Patient given outpatient physical therapy referral; had one signed by hospitalized.  May initiate referral for quicker scheduling.  States has [employer] financial assist program so can afford despite co-pays with insurance.    Follow Up Recommendations  Outpatient PT     Does the patient have the potential to tolerate intense rehabilitation     Barriers to Discharge        Equipment Recommendations  None recommended by PT    Recommendations for Other Services    Frequency Min 3X/week   Plan Discharge plan remains appropriate    Precautions / Restrictions Precautions Precautions: Fall Precaution Comments: Fall risk (See Berg balance assessment)   Pertinent Vitals/Pain No c/o    Mobility  Bed Mobility Bed Mobility: Supine to Sit Supine to Sit: 6: Modified independent (Device/Increase time) Transfers Transfers: Sit to Stand;Stand to Sit Sit to Stand: 7: Independent Stand to Sit: 7: Independent Details for Transfer Assistance: able to stand without UE support, but c/o increased stress on knees. Ambulation/Gait Ambulation/Gait Assistance: 5: Supervision Ambulation Distance (Feet): 5 Feet (short distance in room only, states RN walked him in hall) Assistive device: None Ambulation/Gait Assistance Details: decreased step length, decreased trunk rotation, c/o dizziness with head turn to right up to 3/10    Exercises Other Exercises Other Exercises: Vestibular gaze stability exercises: attemped in standing with target fixed on wall 5' away.  Demonstrated loss of balance and reported target in motion.  Performed seated with slow head turns and nods 30 seconds each with movement of target noted with leftward and downward head movements.   Educated to perform with written HEP given.  Girlfriend present and educated as well.   PT Diagnosis: Abnormality of gait (vertigo)  PT Problem List: Decreased balance;Decreased activity tolerance;Decreased mobility;Other (comment) (vertigo) PT Treatment Interventions: Balance training;Therapeutic activities;Functional mobility training;Stair training;Patient/family education;Other (comment) (vestibular rehab)   PT Goals Acute Rehab PT Goals PT Goal Formulation: With patient Additional Goals Additional Goal #1: Patient will be able to complete HEP for vestibular adaptation independently. PT Goal: Additional Goal #1 - Progress: Met Additional Goal #2: Patient will verbalize compensatory strategy for safe mobility. PT Goal: Additional Goal #2 - Progress: Progressing toward goal  Visit Information  Last PT Received On: 03/20/12 Assistance Needed: +1    Subjective Data  Subjective: Going home this afternoon.  Will see MD in couple of days. Patient Stated Goal: To feel better   Cognition  Overall Cognitive Status: Appears within functional limits for tasks assessed/performed Arousal/Alertness: Awake/alert Orientation Level: Appears intact for tasks assessed Behavior During Session: [facility] for tasks performed Cognition - Other Comments: Slow to respond to questions at times, sleepy initially due to lack of sleep prior to admission with onset of symptoms and was given medications off normal schedule.  Difficulty focusing at times and reports history of memory deficits since TIA's.    Balance  Balance Balance Assessed: Yes Standardized Balance Assessment Standardized Balance Assessment: Berg Balance Test Berg Balance Test Sit to Stand: Able to stand without using hands and stabilize independently Standing Unsupported: Able to stand safely 2 minutes Sitting with Back Unsupported but Feet Supported on Floor or Stool: Able to sit safely and securely 2 minutes Stand to Sit: Sits safely with  minimal use of hands Transfers: Able to transfer  safely, minor use of hands Standing Unsupported with Eyes Closed: Able to stand 10 seconds safely Standing Ubsupported with Feet Together: Able to place feet together independently and stand for 1 minute with supervision From Standing, Reach Forward with Outstretched Arm: Can reach confidently >25 cm (10") From Standing Position, Pick up Object from Floor: Able to pick up shoe safely and easily From Standing Position, Turn to Look Behind Over each Shoulder: Looks behind from both sides and weight shifts well Turn 360 Degrees: Able to turn 360 degrees safely but slowly Standing Unsupported, Alternately Place Feet on Step/Stool: Able to complete >2 steps/needs minimal assist Standing Unsupported, One Foot in Front: Able to plae foot ahead of the other independently and hold 30 seconds Standing on One Leg: Tries to lift leg/unable to hold 3 seconds but remains standing independently Total Score: 46   End of Session PT - End of Session Equipment Utilized During Treatment: Gait belt Activity Tolerance: Patient tolerated treatment well Patient left: in bed;with call bell/phone within reach;with family/visitor present   GP   [place], [place] [phone] 03/20/2012   4:08 PM

## 2012-03-22 ENCOUNTER — Telehealth: Payer: Self-pay | Admitting: Internal Medicine

## 2012-03-22 ENCOUNTER — Encounter: Payer: Self-pay | Admitting: Internal Medicine

## 2012-03-22 ENCOUNTER — Ambulatory Visit (INDEPENDENT_AMBULATORY_CARE_PROVIDER_SITE_OTHER): Payer: BC Managed Care – PPO | Admitting: Internal Medicine

## 2012-03-22 VITALS — BP 134/68 | HR 85 | Temp 97.8°F | Wt 192.0 lb

## 2012-03-22 DIAGNOSIS — R7309 Other abnormal glucose: Secondary | ICD-10-CM

## 2012-03-22 DIAGNOSIS — R7303 Prediabetes: Secondary | ICD-10-CM

## 2012-03-22 DIAGNOSIS — I498 Other specified cardiac arrhythmias: Secondary | ICD-10-CM

## 2012-03-22 DIAGNOSIS — R42 Dizziness and giddiness: Secondary | ICD-10-CM

## 2012-03-22 DIAGNOSIS — K649 Unspecified hemorrhoids: Secondary | ICD-10-CM

## 2012-03-22 DIAGNOSIS — R739 Hyperglycemia, unspecified: Secondary | ICD-10-CM

## 2012-03-22 DIAGNOSIS — E348 Other specified endocrine disorders: Secondary | ICD-10-CM

## 2012-03-22 HISTORY — DX: Other specified cardiac arrhythmias: I49.8

## 2012-03-22 LAB — MAGNESIUM: Magnesium: 2.2 mg/dL (ref 1.5–2.5)

## 2012-03-22 LAB — HEMOGLOBIN A1C: Hgb A1c MFr Bld: 6.2 % (ref 4.6–6.5)

## 2012-03-22 MED ORDER — MECLIZINE HCL 25 MG PO TABS: 25.0000 mg | ORAL_TABLET | Freq: Three times a day (TID) | ORAL | Status: DC | PRN

## 2012-03-22 MED ORDER — HYDROCODONE-ACETAMINOPHEN 7.5-500 MG/15ML PO SOLN: 15.0000 mL | Freq: Four times a day (QID) | ORAL | Status: DC | PRN

## 2012-03-22 MED ORDER — PROMETHAZINE HCL 25 MG RE SUPP: 25.0000 mg | Freq: Two times a day (BID) | RECTAL | Status: DC | PRN

## 2012-03-22 NOTE — Assessment & Plan Note (Addendum)
EKG showed actual sinus rhythm with ectopic ventricular beats. Will discuss with cardiology Recent BMP normal. EKG informalyr discussed with cardiology, ---> normal sinus rhythm with PVCs. Will recommend observation

## 2012-03-22 NOTE — Assessment & Plan Note (Addendum)
Recently admitted to the hospital with severe dizziness, workup reviewed, they suggested vestibular rehabilitation, will refer. Requests a  Phenergan and Antivert refill --Done

## 2012-03-22 NOTE — Telephone Encounter (Signed)
Refill sent to pharmacy.   

## 2012-03-22 NOTE — Assessment & Plan Note (Signed)
Recheck a hemoglobin A1c 

## 2012-03-22 NOTE — Assessment & Plan Note (Signed)
Had some problems while at the hospital, denies major problems today

## 2012-03-22 NOTE — Telephone Encounter (Signed)
Pharmacy called stating the pt is there and needing rx for Lortab solution. Pt states he spoke with someone about this and is waiting at the pharmacy. [facility] [address]

## 2012-03-22 NOTE — Progress Notes (Signed)
  Subjective:    Patient ID: Chase [name], male    DOB: Nov 06, 1960, 51 y.o.   MRN: [id number]  HPI Hospital followup Was admitted for one day on 03/19/2012 with dizziness and darker than usual stools. 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 Blood sugar was slightly elevated, creatinine was 1.1. CBC stable. Saw PT, OT at the hospital, they recommended outpatient vestibular rehabilitation.   Past medical history   Borderline diabetes, DX 5 -2013   Elevated BP Hyperlipidemia   Anxiety and depression   OSA   insomnia   Gout?   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 Sandria Manly 2010 H/o kidney stones  Past surgical history R arm removal of a bone cyst R ear surgery Cataract x 3 , first 1967 Stent R urethra  Social history dovorced 2002, children x 2 , lives w/ girlfriend  Tobacco-- never   ETOH-- no Not working at present  Family history Diabetes-- no CAD-- F ? Stroke-- F?, multiple aunts COPD-- F   Colon cancer-- no Breast cancer-- M Prostate cancer-- no  Review of Systems Since he left the hospital he is better but still has dizziness. Dizziness is triggered by head motion, decrease if he keeps his head down and close his eyes. Continue with moderate headaches (not a new issue) Occasional nausea Denies chest pain except for some epigastric/chest discomfort w/ certain changes in  position. No palpitations percent     Objective:   Physical Exam General -- alert, well-developed, and well-nourished.   Lungs -- normal respiratory effort, no intercostal retractions, no accessory muscle use, and normal breath sounds.   Heart-- bigeminy ?, no murmur, and no gallop.   Extremities-- no pretibial edema bilaterally Neurologic-- alert & oriented X3 , EOMI,  unable to assess pupils d/t surgeries, strength normal in all  extremities. Psych-- Cognition and judgment appear intact.  Speech is slow, at baseline. Not anxious appearing and not depressed appearing.        Assessment & Plan:

## 2012-03-22 NOTE — Assessment & Plan Note (Signed)
Recent MRI stable

## 2012-03-23 ENCOUNTER — Telehealth: Payer: Self-pay | Admitting: *Deleted

## 2012-03-25 ENCOUNTER — Ambulatory Visit: Payer: BC Managed Care – PPO | Admitting: *Deleted

## 2012-03-26 ENCOUNTER — Ambulatory Visit: Payer: BC Managed Care – PPO | Attending: Internal Medicine | Admitting: Physical Therapy

## 2012-03-26 DIAGNOSIS — IMO0001 Reserved for inherently not codable concepts without codable children: Secondary | ICD-10-CM | POA: Insufficient documentation

## 2012-03-26 DIAGNOSIS — R42 Dizziness and giddiness: Secondary | ICD-10-CM | POA: Insufficient documentation

## 2012-03-26 DIAGNOSIS — R269 Unspecified abnormalities of gait and mobility: Secondary | ICD-10-CM | POA: Insufficient documentation

## 2012-03-30 ENCOUNTER — Ambulatory Visit: Payer: BC Managed Care – PPO | Admitting: *Deleted

## 2012-04-02 ENCOUNTER — Ambulatory Visit: Payer: BC Managed Care – PPO | Admitting: Physical Therapy

## 2012-04-05 ENCOUNTER — Other Ambulatory Visit: Payer: Self-pay | Admitting: Internal Medicine

## 2012-04-05 ENCOUNTER — Encounter: Payer: BC Managed Care – PPO | Attending: Internal Medicine | Admitting: *Deleted

## 2012-04-05 DIAGNOSIS — R7303 Prediabetes: Secondary | ICD-10-CM

## 2012-04-05 DIAGNOSIS — Z713 Dietary counseling and surveillance: Secondary | ICD-10-CM | POA: Insufficient documentation

## 2012-04-05 DIAGNOSIS — R7309 Other abnormal glucose: Secondary | ICD-10-CM | POA: Insufficient documentation

## 2012-04-05 NOTE — Progress Notes (Signed)
Medical Nutrition Therapy:  Appt start time: 1530 end time:  1600.   Assessment:  Primary concerns today: prediabetes.   MEDICATIONS: see list   DIETARY INTAKE:  Everyday foods include sweets and sugary cereals.  Avoided foods include none.    24-hr recall:  B ( AM): cereal  Snk ( AM): more cereal  L ( PM): more cereal Snk ( PM): ice cream or chocolate D ( PM): more cereal or ice cream Snk ( PM): cookies Beverages: sobe or water or milk  Usual physical activity: none  Estimated energy needs: 1600 calories 180 g carbohydrates 120 g protein 42 g fat  Progress Towards Goal(s):  No progress.   Nutritional Diagnosis:  NB-1.6 Limited adherence to nutrition-related recommendations As related to carb counting and limiting sugar intake.  As evidenced by no change in dietary intake and no change in HbA1C value.    Intervention:  Nutrition counseling provided.  Chase [name] is here for another follow up appointment related to his elevated blood sugars.  He was recently hospitalized for vertigo and is having some difficulties getting around.  His diet consists mostly of sugary cereals, sugary drinks, and concentrated sweets.  Chase [name] actually called me last week after his hospital discharge and I discussed his lab values with him and strongly encouraged some lifestyle modification if he wants to prevent diabetes.  Today we discussed My[employer] recommendations for meal planning: complex carbohydrates, lean protein, and more vegetables.  Encouraged increasing fiber from whole grains and non starchy vegetables.  Discussed various cereal options.  Discussed non sugary snacks.  Chase [name] would like to be more physically active- gave information on chair exercises that he can do while seated.     Monitoring/Evaluation:  Dietary intake, exercise,  and body weight in 1 month(s).

## 2012-04-05 NOTE — Patient Instructions (Addendum)
Goal: 1600 calories 180 g carbohydrate; dietary fiber 25 g; aim for 2-3g/serving keep sugars less than 10 g/serving 120 g protein 42 g fat  Try fiber one chocolate squares cereal  Try chair exercises 15-20 minutes 3 day/week  Aim for 3 balanced meals: carbs, proteins, and vegetables/fruits  Snacks: nuts, cheese, crackers, not sweet!!!!

## 2012-04-06 ENCOUNTER — Encounter: Payer: BC Managed Care – PPO | Admitting: *Deleted

## 2012-04-06 ENCOUNTER — Ambulatory Visit: Payer: BC Managed Care – PPO | Admitting: Physical Therapy

## 2012-04-06 NOTE — Telephone Encounter (Signed)
Pt states that he doesn't use the med all of the time but that he started having sinus problems again & the medicine seems to help. I explained to the pt that he may need to come in for another OV to receive a refill but pt stated he was just here for this 2 weeks ago. Please advise.

## 2012-04-06 NOTE — Telephone Encounter (Signed)
I just did a refill for sporadic use

## 2012-04-06 NOTE — Telephone Encounter (Signed)
Hycodan is not for chronic use. If he is using that medicine frequently, we can either see him or discuss on return to the office.

## 2012-04-06 NOTE — Telephone Encounter (Signed)
Ok to refill 

## 2012-04-09 ENCOUNTER — Ambulatory Visit: Payer: BC Managed Care – PPO | Admitting: *Deleted

## 2012-04-13 ENCOUNTER — Ambulatory Visit: Payer: BC Managed Care – PPO | Admitting: *Deleted

## 2012-04-13 ENCOUNTER — Encounter: Payer: BC Managed Care – PPO | Admitting: *Deleted

## 2012-04-16 ENCOUNTER — Ambulatory Visit: Payer: No Typology Code available for payment source | Attending: Internal Medicine | Admitting: *Deleted

## 2012-04-16 DIAGNOSIS — IMO0001 Reserved for inherently not codable concepts without codable children: Secondary | ICD-10-CM | POA: Insufficient documentation

## 2012-04-16 DIAGNOSIS — R269 Unspecified abnormalities of gait and mobility: Secondary | ICD-10-CM | POA: Insufficient documentation

## 2012-04-16 DIAGNOSIS — R42 Dizziness and giddiness: Secondary | ICD-10-CM | POA: Insufficient documentation

## 2012-04-19 ENCOUNTER — Telehealth: Payer: Self-pay | Admitting: Internal Medicine

## 2012-04-19 NOTE — Telephone Encounter (Signed)
ok 

## 2012-04-19 NOTE — Telephone Encounter (Signed)
PCP Change request from pt to Dr.Lowne as she is a DO and he has been advised to see one of these types of  doc to maybe better assist him with his healthcare issues Please review and advise  And I will call patient back to schedule patient accordingly

## 2012-04-20 ENCOUNTER — Other Ambulatory Visit (HOSPITAL_COMMUNITY): Payer: Self-pay | Admitting: Neurology

## 2012-04-20 ENCOUNTER — Ambulatory Visit: Payer: No Typology Code available for payment source | Admitting: Physical Therapy

## 2012-04-20 DIAGNOSIS — M5412 Radiculopathy, cervical region: Secondary | ICD-10-CM

## 2012-04-20 NOTE — Telephone Encounter (Signed)
Called patient 11am, he would like to meet with Lowne to go over health care--put in 4:15pm aon 11.7.13

## 2012-04-21 ENCOUNTER — Ambulatory Visit: Payer: No Typology Code available for payment source | Admitting: Physical Therapy

## 2012-04-22 ENCOUNTER — Encounter: Payer: Self-pay | Admitting: Family Medicine

## 2012-04-22 ENCOUNTER — Ambulatory Visit (INDEPENDENT_AMBULATORY_CARE_PROVIDER_SITE_OTHER): Payer: BC Managed Care – PPO | Admitting: Family Medicine

## 2012-04-22 VITALS — BP 132/70 | HR 90 | Temp 98.7°F | Wt 193.2 lb

## 2012-04-22 DIAGNOSIS — R21 Rash and other nonspecific skin eruption: Secondary | ICD-10-CM

## 2012-04-22 DIAGNOSIS — R300 Dysuria: Secondary | ICD-10-CM

## 2012-04-22 DIAGNOSIS — R109 Unspecified abdominal pain: Secondary | ICD-10-CM

## 2012-04-22 DIAGNOSIS — M549 Dorsalgia, unspecified: Secondary | ICD-10-CM

## 2012-04-22 LAB — POCT URINALYSIS DIPSTICK
Bilirubin, UA: NEGATIVE
Blood, UA: NEGATIVE
Glucose, UA: NEGATIVE
Ketones, UA: NEGATIVE
Leukocytes, UA: NEGATIVE
Nitrite, UA: NEGATIVE
Spec Grav, UA: 1.01
Urobilinogen, UA: 0.2
pH, UA: 6.5

## 2012-04-22 MED ORDER — MOMETASONE FUROATE 0.1 % EX CREA: TOPICAL_CREAM | Freq: Every day | CUTANEOUS | Status: DC

## 2012-04-22 NOTE — Progress Notes (Signed)
  Subjective:    Chase [name] is a 51 y.o. male who presents for evaluation of low back pain. The patient has had no prior back problems. Symptoms have been present for a few weeks and are gradually worsening.  Onset was related to / precipitated by lifting a heavy object. The pain is located in the across the lower back or mid back and does not radiate. The pain is described as aching and burning and occurs all day. He rates his pain as severe. Symptoms are exacerbated by extension, flexion, lifting and urinating. Symptoms are improved by nothing. He has also tried nothing which provided no symptom relief. He has urinary hesitancy associated with the back pain. The patient has no "red flag" history indicative of complicated back pain.  The following portions of the patient's history were reviewed and updated as appropriate: allergies, current medications, past family history, past medical history, past social history, past surgical history and problem list.  Review of Systems Pertinent items are noted in HPI.    Objective:   Full range of motion without pain, no tenderness, no spasm, no curvature. Normal reflexes, gait, strength and negative straight-leg raise.  abd--  + guarding LLQ,  Soft, +bs  Assessment:    Nonspecific acute low back pain   LLQ pain--- check CT Plan:    Natural history and expected course discussed. Questions answered. Short (2-4 day) period of relative rest recommended until acute symptoms improve. Ice to affected area as needed for local pain relief. Heat to affected area as needed for local pain relief. CT scan of the affected area due to presence of abd pain. OTC analgesics as needed. f/u prn

## 2012-04-22 NOTE — Patient Instructions (Addendum)
Abdominal Pain  Abdominal pain can be caused by many things. Your caregiver decides the seriousness of your pain by an examination and possibly blood tests and X-rays. Many cases can be observed and treated at home. Most abdominal pain is not caused by a disease and will probably improve without treatment. However, in many cases, more time must pass before a clear cause of the pain can be found. Before that point, it may not be known if you need more testing, or if hospitalization or surgery is needed.  HOME CARE INSTRUCTIONS   · Do not take laxatives unless directed by your caregiver.  · Take pain medicine only as directed by your caregiver.  · Only take over-the-counter or prescription medicines for pain, discomfort, or fever as directed by your caregiver.  · Try a clear liquid diet (broth, tea, or water) for as long as directed by your caregiver. Slowly move to a bland diet as tolerated.  SEEK IMMEDIATE MEDICAL CARE IF:   · The pain does not go away.  · You have a fever.  · You keep throwing up (vomiting).  · The pain is felt only in portions of the abdomen. Pain in the right side could possibly be appendicitis. In an adult, pain in the left lower portion of the abdomen could be colitis or diverticulitis.  · You pass bloody or black tarry stools.  MAKE SURE YOU:   · Understand these instructions.  · Will watch your condition.  · Will get help right away if you are not doing well or get worse.  Document Released: 03/12/2005 Document Revised: 08/25/2011 Document Reviewed: 01/19/2008  [employer]® Patient Information ©2013 [employer].

## 2012-04-23 ENCOUNTER — Ambulatory Visit: Payer: No Typology Code available for payment source | Admitting: Physical Therapy

## 2012-04-23 ENCOUNTER — Ambulatory Visit (INDEPENDENT_AMBULATORY_CARE_PROVIDER_SITE_OTHER)
Admission: RE | Admit: 2012-04-23 | Discharge: 2012-04-23 | Disposition: A | Discharge status: Home / Self Care | Payer: BC Managed Care – PPO | Source: Ambulatory Visit | Attending: Family Medicine | Admitting: Family Medicine

## 2012-04-23 ENCOUNTER — Ambulatory Visit (HOSPITAL_COMMUNITY)
Admission: RE | Admit: 2012-04-23 | Discharge: 2012-04-23 | Disposition: A | Discharge status: Home / Self Care | Payer: BC Managed Care – PPO | Source: Ambulatory Visit | Attending: Neurology | Admitting: Neurology

## 2012-04-23 ENCOUNTER — Other Ambulatory Visit: Payer: BC Managed Care – PPO

## 2012-04-23 ENCOUNTER — Other Ambulatory Visit (INDEPENDENT_AMBULATORY_CARE_PROVIDER_SITE_OTHER): Payer: BC Managed Care – PPO

## 2012-04-23 DIAGNOSIS — M5412 Radiculopathy, cervical region: Secondary | ICD-10-CM

## 2012-04-23 DIAGNOSIS — M79609 Pain in unspecified limb: Secondary | ICD-10-CM | POA: Insufficient documentation

## 2012-04-23 DIAGNOSIS — M542 Cervicalgia: Secondary | ICD-10-CM | POA: Insufficient documentation

## 2012-04-23 DIAGNOSIS — R109 Unspecified abdominal pain: Secondary | ICD-10-CM

## 2012-04-23 LAB — CBC WITH DIFFERENTIAL/PLATELET
Basophils Absolute: 0 10*3/uL (ref 0.0–0.1)
Basophils Relative: 0.2 % (ref 0.0–3.0)
Eosinophils Absolute: 0.2 10*3/uL (ref 0.0–0.7)
Eosinophils Relative: 2.2 % (ref 0.0–5.0)
HCT: 43.3 % (ref 39.0–52.0)
Hemoglobin: 14.4 g/dL (ref 13.0–17.0)
Lymphocytes Relative: 22.6 % (ref 12.0–46.0)
Lymphs Abs: 1.9 10*3/uL (ref 0.7–4.0)
MCHC: 33.2 g/dL (ref 30.0–36.0)
MCV: 89.2 fl (ref 78.0–100.0)
Monocytes Absolute: 0.7 10*3/uL (ref 0.1–1.0)
Monocytes Relative: 8.2 % (ref 3.0–12.0)
Neutro Abs: 5.6 10*3/uL (ref 1.4–7.7)
Neutrophils Relative %: 66.8 % (ref 43.0–77.0)
Platelets: 313 10*3/uL (ref 150.0–400.0)
RBC: 4.85 Mil/uL (ref 4.22–5.81)
RDW: 13.5 % (ref 11.5–14.6)
WBC: 8.4 10*3/uL (ref 4.5–10.5)

## 2012-04-23 LAB — HEPATIC FUNCTION PANEL
ALT: 21 U/L (ref 0–53)
AST: 22 U/L (ref 0–37)
Albumin: 4.2 g/dL (ref 3.5–5.2)
Alkaline Phosphatase: 64 U/L (ref 39–117)
Bilirubin, Direct: 0.1 mg/dL (ref 0.0–0.3)
Total Bilirubin: 0.9 mg/dL (ref 0.3–1.2)
Total Protein: 7.2 g/dL (ref 6.0–8.3)

## 2012-04-23 LAB — BASIC METABOLIC PANEL
BUN: 15 mg/dL (ref 6–23)
CO2: 31 mEq/L (ref 19–32)
Calcium: 9.2 mg/dL (ref 8.4–10.5)
Chloride: 104 mEq/L (ref 96–112)
Creatinine, Ser: 1.2 mg/dL (ref 0.4–1.5)
GFR: 65.76 mL/min (ref >60.00)
Glucose, Bld: 108 mg/dL — ABNORMAL HIGH (ref 70–99)
Potassium: 3.6 mEq/L (ref 3.5–5.1)
Sodium: 140 mEq/L (ref 135–145)

## 2012-04-23 LAB — FECAL OCCULT BLOOD, IMMUNOCHEMICAL: Fecal Occult Bld: NEGATIVE

## 2012-04-23 MED ORDER — IOHEXOL 300 MG/ML  SOLN
100.0000 mL | Freq: Once | INTRAMUSCULAR | Status: AC | PRN
  Administered 2012-04-23: 100 mL via INTRAVENOUS

## 2012-04-26 ENCOUNTER — Ambulatory Visit: Payer: No Typology Code available for payment source | Admitting: Physical Therapy

## 2012-04-28 ENCOUNTER — Ambulatory Visit: Payer: No Typology Code available for payment source | Admitting: Physical Therapy

## 2012-04-30 ENCOUNTER — Ambulatory Visit: Payer: No Typology Code available for payment source | Admitting: Physical Therapy

## 2012-05-03 ENCOUNTER — Ambulatory Visit: Payer: No Typology Code available for payment source | Admitting: Physical Therapy

## 2012-05-05 ENCOUNTER — Encounter: Payer: BC Managed Care – PPO | Attending: Internal Medicine | Admitting: *Deleted

## 2012-05-05 ENCOUNTER — Ambulatory Visit: Payer: No Typology Code available for payment source | Admitting: Physical Therapy

## 2012-05-05 DIAGNOSIS — R7309 Other abnormal glucose: Secondary | ICD-10-CM | POA: Insufficient documentation

## 2012-05-05 DIAGNOSIS — R7303 Prediabetes: Secondary | ICD-10-CM

## 2012-05-05 DIAGNOSIS — Z713 Dietary counseling and surveillance: Secondary | ICD-10-CM | POA: Insufficient documentation

## 2012-05-05 NOTE — Patient Instructions (Addendum)
Limit sugars to 0-9g PER SERVING  Watch out for your serving size  Each meal aim for some protein (chicken, [place], fish, etc), grains or starch, vegetable  Use fruit for sweet cravings.  Drink more water :-)

## 2012-05-05 NOTE — Progress Notes (Signed)
  Medical Nutrition Therapy:  Appt start time: 1230 end time:  1300.   Assessment:  Primary concerns today: prediabetes.   MEDICATIONS: see list   DIETARY INTAKE:  Usual eating pattern includes 3 meals and 1-2 snacks per day.  Everyday foods include sweets, some proteins, starches.  Avoided foods include none.    24-hr recall:  B ( AM): honey bunches of oats with ice cream  Snk ( AM): none  L ( PM): fish sandwich and brownie a la mode.  maybe [place] or chicken with noodles Snk ( PM): ice cream or cookies D ( PM): [place] burger or goulash or spaghetti or soup or wrap or salad or cereal Snk ( PM): something sweet Beverages: tea, water  Usual physical activity: none  Estimated energy needs:  1600 calories  180 g carbohydrates  120 g protein  42 g fat   Progress Towards Goal(s): No progress.   Nutritional Diagnosis:  NB-1.6 Limited adherence to nutrition-related recommendations As related to carb counting and limiting sugar intake. As evidenced by no change in dietary intake and no change in HbA1C value.  Intervention:  Nutrition counseling provided.  Chase [name] has made no changes to his diet. He still consumes sugary cereals, sugary drinks, sugary snacks, and very little else.  Discussed reading food labels for sugar content.  I strongly suggested limiting sugars to 0-9g/serving and limiting his portion to 1 serving.  When he craves a sweet snack, try fruit.  With his meals aim to have vegetables every dinner and some form of protein.  Chase [name] seemed resistant to these recommendations, even though they are the same recommendations that have been made since June.  Chase [name] is not physically active and the only way to control his weight and glucose and risk for diabetes is through dietary modification.  I told him that he needs to make changes in order to stay as healthy as possible.    Handouts given during visit include:  Food labels handout  Monitoring/Evaluation:  Dietary intake,  exercise, and body weight in 1 month(s).

## 2012-05-06 ENCOUNTER — Ambulatory Visit: Payer: No Typology Code available for payment source | Admitting: Physical Therapy

## 2012-05-10 ENCOUNTER — Ambulatory Visit: Payer: No Typology Code available for payment source | Admitting: Physical Therapy

## 2012-05-12 ENCOUNTER — Ambulatory Visit: Payer: No Typology Code available for payment source | Admitting: Physical Therapy

## 2012-05-17 ENCOUNTER — Ambulatory Visit: Payer: No Typology Code available for payment source | Attending: Internal Medicine | Admitting: Physical Therapy

## 2012-05-17 DIAGNOSIS — IMO0001 Reserved for inherently not codable concepts without codable children: Secondary | ICD-10-CM | POA: Insufficient documentation

## 2012-05-17 DIAGNOSIS — R42 Dizziness and giddiness: Secondary | ICD-10-CM | POA: Insufficient documentation

## 2012-05-17 DIAGNOSIS — R269 Unspecified abnormalities of gait and mobility: Secondary | ICD-10-CM | POA: Insufficient documentation

## 2012-05-19 ENCOUNTER — Ambulatory Visit: Payer: No Typology Code available for payment source | Admitting: Physical Therapy

## 2012-05-21 ENCOUNTER — Encounter: Payer: BC Managed Care – PPO | Admitting: Physical Therapy

## 2012-05-24 ENCOUNTER — Ambulatory Visit: Payer: No Typology Code available for payment source | Admitting: Physical Therapy

## 2012-05-27 ENCOUNTER — Encounter: Payer: Self-pay | Admitting: Physical Therapy

## 2012-05-27 ENCOUNTER — Ambulatory Visit: Payer: BC Managed Care – PPO | Admitting: Internal Medicine

## 2012-06-10 ENCOUNTER — Ambulatory Visit: Payer: BC Managed Care – PPO | Admitting: *Deleted

## 2012-06-25 ENCOUNTER — Other Ambulatory Visit: Payer: Self-pay | Admitting: Family Medicine

## 2012-07-12 ENCOUNTER — Encounter: Payer: Self-pay | Admitting: Family Medicine

## 2012-07-12 ENCOUNTER — Other Ambulatory Visit: Payer: Self-pay | Admitting: Internal Medicine

## 2012-07-13 ENCOUNTER — Encounter: Payer: Self-pay | Admitting: Family Medicine

## 2012-07-13 MED ORDER — HYDROCODONE-ACETAMINOPHEN 7.5-500 MG/15ML PO SOLN: 15.0000 mL | Freq: Four times a day (QID) | ORAL | Status: DC | PRN

## 2012-07-13 MED ORDER — HYDROCORTISONE ACE-PRAMOXINE 2.5-1 % RE CREA: 1.0000 "application " | TOPICAL_CREAM | Freq: Two times a day (BID) | RECTAL | Status: DC | PRN

## 2012-07-13 NOTE — Telephone Encounter (Signed)
Per PCP 

## 2012-07-13 NOTE — Telephone Encounter (Signed)
Ok to refill? Last OV 10.7.13-PCP chance 11.5.13

## 2012-07-31 ENCOUNTER — Other Ambulatory Visit: Payer: Self-pay

## 2012-08-05 ENCOUNTER — Other Ambulatory Visit: Payer: Self-pay | Admitting: Family Medicine

## 2012-08-05 ENCOUNTER — Encounter: Payer: Self-pay | Admitting: Family Medicine

## 2012-08-05 MED ORDER — HYDROCODONE-ACETAMINOPHEN 7.5-500 MG/15ML PO SOLN: 15.0000 mL | Freq: Four times a day (QID) | ORAL | Status: DC | PRN

## 2012-08-05 NOTE — Telephone Encounter (Signed)
Hydrocodone syrup last filled 07/13/12. Please advise     KP

## 2012-08-06 MED ORDER — HYDROCODONE-ACETAMINOPHEN 7.5-500 MG/15ML PO SOLN: 15.0000 mL | Freq: Four times a day (QID) | ORAL | Status: DC | PRN

## 2012-08-06 MED ORDER — VENLAFAXINE HCL ER 37.5 MG PO CP24: 37.5000 mg | ORAL_CAPSULE | Freq: Every day | ORAL | Status: DC

## 2012-08-06 NOTE — Addendum Note (Signed)
Addended by: Arnette Norris on: 08/06/2012 08:52 AM   Modules accepted: Orders

## 2012-08-10 ENCOUNTER — Encounter: Payer: Self-pay | Admitting: Family Medicine

## 2012-08-10 ENCOUNTER — Ambulatory Visit (INDEPENDENT_AMBULATORY_CARE_PROVIDER_SITE_OTHER): Payer: No Typology Code available for payment source | Admitting: Family Medicine

## 2012-08-10 VITALS — BP 140/92 | HR 80 | Temp 98.5°F | Wt 195.2 lb

## 2012-08-10 DIAGNOSIS — J069 Acute upper respiratory infection, unspecified: Secondary | ICD-10-CM

## 2012-08-10 DIAGNOSIS — I83893 Varicose veins of bilateral lower extremities with other complications: Secondary | ICD-10-CM | POA: Insufficient documentation

## 2012-08-10 DIAGNOSIS — J019 Acute sinusitis, unspecified: Secondary | ICD-10-CM

## 2012-08-10 DIAGNOSIS — K649 Unspecified hemorrhoids: Secondary | ICD-10-CM

## 2012-08-10 DIAGNOSIS — I83812 Varicose veins of left lower extremities with pain: Secondary | ICD-10-CM

## 2012-08-10 HISTORY — DX: Varicose veins of bilateral lower extremities with other complications: I83.893

## 2012-08-10 HISTORY — DX: Acute upper respiratory infection, unspecified: J06.9

## 2012-08-10 MED ORDER — HYDROCORTISONE ACETATE 25 MG RE SUPP: 25.0000 mg | Freq: Two times a day (BID) | RECTAL | Status: DC

## 2012-08-10 NOTE — Assessment & Plan Note (Signed)
Sitz baths anusol supp and con't cream Refer surgery if no better

## 2012-08-10 NOTE — Patient Instructions (Addendum)

## 2012-08-10 NOTE — Assessment & Plan Note (Signed)
Compression stockings Consider vein clinic if no better

## 2012-08-10 NOTE — Progress Notes (Signed)
  Subjective:     Chase [name] is a 52 y.o. male who presents for evaluation of symptoms of a URI. Symptoms include congestion, nasal congestion, post nasal drip, sneezing and sore throat. Onset of symptoms was 1 week ago, and has been gradually worsening since that time. Treatment to date: antihistamines, cough suppressants, decongestants and nasal steroids.  The following portions of the patient's history were reviewed and updated as appropriate: allergies, current medications, past family history, past medical history, past social history, past surgical history and problem list.  Review of Systems Pertinent items are noted in HPI.   Objective:    BP 140/92  Pulse 80  Temp(Src) 98.5 F (36.9 C) (Oral)  Wt 195 lb 3.2 oz (88.542 kg)  BMI 27.24 kg/m2  SpO2 98% General appearance: alert, cooperative and no distress Ears: normal TM's and external ear canals both ears Nose: clear discharge, mild congestion, turbinates red, swollen Throat: lips, mucosa, and tongue normal; teeth and gums normal Neck: no adenopathy and thyroid not enlarged, symmetric, no tenderness/mass/nodules Lungs: clear to auscultation bilaterally Heart: S1, S2 normal  Rectal--- + ext hemorrhoids, not bleeding Legs-- + varicose veins b/l legs --to above knees  Assessment:    viral upper respiratory illness  Hemorrhoids---   Plan:    Discussed diagnosis and treatment of URI. Suggested symptomatic OTC remedies. Nasal saline spray for congestion. Nasal steroids per orders. Follow up as needed.

## 2012-08-10 NOTE — Assessment & Plan Note (Signed)
con't flonase Antihistamine Call or rto prn

## 2012-09-01 ENCOUNTER — Other Ambulatory Visit: Payer: Self-pay | Admitting: Family Medicine

## 2012-09-01 ENCOUNTER — Other Ambulatory Visit: Payer: Self-pay | Admitting: Internal Medicine

## 2012-09-01 NOTE — Telephone Encounter (Signed)
Last OV 08-10-12, last filled 08-06-12 #120

## 2012-09-01 NOTE — Telephone Encounter (Signed)
Ok to refill? Last OV 2 .25.14 Last filled 9.16.13

## 2012-09-02 ENCOUNTER — Encounter: Payer: Self-pay | Admitting: Family Medicine

## 2012-09-02 ENCOUNTER — Ambulatory Visit (INDEPENDENT_AMBULATORY_CARE_PROVIDER_SITE_OTHER): Payer: No Typology Code available for payment source | Admitting: Family Medicine

## 2012-09-02 ENCOUNTER — Other Ambulatory Visit: Payer: Self-pay | Admitting: Family Medicine

## 2012-09-02 VITALS — BP 126/90 | HR 95 | Wt 192.2 lb

## 2012-09-02 DIAGNOSIS — J019 Acute sinusitis, unspecified: Secondary | ICD-10-CM

## 2012-09-02 DIAGNOSIS — K644 Residual hemorrhoidal skin tags: Secondary | ICD-10-CM

## 2012-09-02 DIAGNOSIS — K648 Other hemorrhoids: Secondary | ICD-10-CM

## 2012-09-02 MED ORDER — HYDROCORTISONE ACE-PRAMOXINE 2.5-1 % RE CREA: 1.0000 "application " | TOPICAL_CREAM | Freq: Two times a day (BID) | RECTAL | Status: DC | PRN

## 2012-09-02 MED ORDER — VENLAFAXINE HCL ER 37.5 MG PO CP24: 37.5000 mg | ORAL_CAPSULE | Freq: Every day | ORAL | Status: DC

## 2012-09-02 MED ORDER — METHYLPREDNISOLONE ACETATE 80 MG/ML IJ SUSP
80.0000 mg | Freq: Once | INTRAMUSCULAR | Status: AC
  Administered 2012-09-02: 80 mg via INTRAMUSCULAR

## 2012-09-02 MED ORDER — ATORVASTATIN CALCIUM 10 MG PO TABS: 10.0000 mg | ORAL_TABLET | Freq: Every day | ORAL | Status: DC

## 2012-09-02 MED ORDER — PREDNISONE 10 MG PO TABS: ORAL_TABLET | ORAL | Status: DC

## 2012-09-02 NOTE — Telephone Encounter (Signed)
Pt does need to sign agreement and do UDS if not done

## 2012-09-02 NOTE — Progress Notes (Signed)
  Subjective:     Chase [name] is a 52 y.o. male who presents for evaluation of sinus pain. Symptoms include: congestion, cough, facial pain, headaches, nasal congestion, post nasal drip and sinus pressure. Onset of symptoms was several days ago. Symptoms have been gradually worsening since that time. Past history is significant for no history of pneumonia or bronchitis. Patient is a non-smoker.  The following portions of the patient's history were reviewed and updated as appropriate: allergies, current medications, past family history, past medical history, past social history, past surgical history and problem list.  Review of Systems Pertinent items are noted in HPI.   Objective:    BP 126/90  Pulse 95  Wt 192 lb 3.2 oz (87.181 kg)  BMI 26.82 kg/m2  SpO2 97% General appearance: alert, cooperative, appears stated age and no distress Ears: normal TM's and external ear canals both ears Nose: green discharge, moderate congestion, turbinates red, swollen, sinus tenderness bilateral Throat: lips, mucosa, and tongue normal; teeth and gums normal Lungs: clear to auscultation bilaterally Heart: S1, S2 normal    Assessment:    Acute bacterial sinusitis.   hemorrhoids--- refer to gi / surgery for eval----rx meds with no relief Plan:    Nasal saline sprays. Nasal steroids per medication orders. Antihistamines per medication orders. avelox per medication orders. pred taper

## 2012-09-02 NOTE — Telephone Encounter (Signed)
Discuss with patient will sign agreement.

## 2012-09-02 NOTE — Patient Instructions (Addendum)

## 2012-09-03 ENCOUNTER — Other Ambulatory Visit: Payer: Self-pay | Admitting: Family Medicine

## 2012-09-03 NOTE — Telephone Encounter (Signed)
Last seen 08/05/12 and filled 08/06/12. Please advise     KP

## 2012-09-03 NOTE — Telephone Encounter (Signed)
Last seen 09/02/12 and filled 08/06/12. Please advise     KP

## 2012-09-16 ENCOUNTER — Ambulatory Visit (INDEPENDENT_AMBULATORY_CARE_PROVIDER_SITE_OTHER): Payer: No Typology Code available for payment source | Admitting: Family Medicine

## 2012-09-16 ENCOUNTER — Encounter: Payer: Self-pay | Admitting: Family Medicine

## 2012-09-16 VITALS — BP 150/98 | HR 86 | Temp 98.8°F | Wt 190.6 lb

## 2012-09-16 DIAGNOSIS — R51 Headache: Secondary | ICD-10-CM

## 2012-09-16 DIAGNOSIS — R739 Hyperglycemia, unspecified: Secondary | ICD-10-CM

## 2012-09-16 DIAGNOSIS — M79644 Pain in right finger(s): Secondary | ICD-10-CM

## 2012-09-16 DIAGNOSIS — E785 Hyperlipidemia, unspecified: Secondary | ICD-10-CM

## 2012-09-16 DIAGNOSIS — R7303 Prediabetes: Secondary | ICD-10-CM

## 2012-09-16 DIAGNOSIS — J309 Allergic rhinitis, unspecified: Secondary | ICD-10-CM

## 2012-09-16 DIAGNOSIS — I83893 Varicose veins of bilateral lower extremities with other complications: Secondary | ICD-10-CM

## 2012-09-16 DIAGNOSIS — J302 Other seasonal allergic rhinitis: Secondary | ICD-10-CM

## 2012-09-16 DIAGNOSIS — I83813 Varicose veins of bilateral lower extremities with pain: Secondary | ICD-10-CM

## 2012-09-16 DIAGNOSIS — K649 Unspecified hemorrhoids: Secondary | ICD-10-CM

## 2012-09-16 DIAGNOSIS — I10 Essential (primary) hypertension: Secondary | ICD-10-CM

## 2012-09-16 DIAGNOSIS — R7309 Other abnormal glucose: Secondary | ICD-10-CM

## 2012-09-16 DIAGNOSIS — R413 Other amnesia: Secondary | ICD-10-CM

## 2012-09-16 DIAGNOSIS — M79609 Pain in unspecified limb: Secondary | ICD-10-CM

## 2012-09-16 MED ORDER — HYDROCODONE-ACETAMINOPHEN 7.5-325 MG/15ML PO SOLN: 15.0000 mL | Freq: Four times a day (QID) | ORAL | Status: DC | PRN

## 2012-09-16 MED ORDER — HYDROCODONE-ACETAMINOPHEN 7.5-500 MG/15ML PO SOLN: 15.0000 mL | Freq: Four times a day (QID) | ORAL | Status: DC | PRN

## 2012-09-16 MED ORDER — BECLOMETHASONE DIPROPIONATE 80 MCG/ACT NA AERS: 2.0000 | INHALATION_SPRAY | Freq: Every day | NASAL | Status: DC

## 2012-09-16 MED ORDER — METOPROLOL TARTRATE 25 MG PO TABS: 25.0000 mg | ORAL_TABLET | Freq: Two times a day (BID) | ORAL | Status: DC

## 2012-09-16 NOTE — Assessment & Plan Note (Signed)
Check labs 

## 2012-09-16 NOTE — Progress Notes (Signed)
  Subjective:    Patient ID: Chase [name], male    DOB: August 30, 1960, 52 y.o.   MRN: [id number]  HPI Pt here c/o swelling and pain in both legs secondary to varicose veins.   The compression stockings were too expensive so he got otc compression socks which helps some.   He also hurt his finger after putting it in loop on shoe and pulling up.  It is swollen and tender to touch.   Pt also states he has not heard about surgeon referral. He also struggles with memory and concentrating and used to see Dr love but he retired and he has not seen anyone since.  He is having a ha and noticed bp high and wonders if his sinus infection is back.  No congestion, or fever.     Review of Systems As above     Objective:   Physical Exam  BP 150/98  Pulse 86  Temp(Src) 98.8 F (37.1 C) (Oral)  Wt 190 lb 9.6 oz (86.456 kg)  BMI 26.6 kg/m2  SpO2 95% General appearance: alert, cooperative, appears stated age and no distress Ears: normal TM's and external ear canals both ears Nose: Nares normal. Septum midline. Mucosa normal. No drainage or sinus tenderness. Throat: lips, mucosa, and tongue normal; teeth and gums normal Neck: no adenopathy, no carotid bruit, no JVD, supple, symmetrical, trachea midline and thyroid not enlarged, symmetric, no tenderness/mass/nodules Lungs: clear to auscultation bilaterally Heart: S1, S2 normal Extremities: edema b/l and varicose veins noted      Assessment & Plan:

## 2012-09-16 NOTE — Assessment & Plan Note (Signed)
Check labs con't to watch diet  

## 2012-09-16 NOTE — Assessment & Plan Note (Signed)
con't current care F/u surgeon

## 2012-09-16 NOTE — Assessment & Plan Note (Signed)
Check labs Inc metoprolol to 25 mg bid  Send bp readings in 2 weeks

## 2012-09-16 NOTE — Patient Instructions (Addendum)
Varicose Veins Varicose veins are veins that have become enlarged and twisted. CAUSES This condition is the result of valves in the veins not working properly. Valves in the veins help return blood from the leg to the heart. If these valves are damaged, blood flows backwards and backs up into the veins in the leg near the skin. This causes the veins to become larger. People who are on their feet a lot, who are pregnant, or who are overweight are more likely to develop varicose veins. SYMPTOMS   Bulging, twisted-appearing, bluish veins, most commonly found on the legs.  Leg pain or a feeling of heaviness. These symptoms may be worse at the end of the day.  Leg swelling.  Skin color changes. DIAGNOSIS  Varicose veins can usually be diagnosed with an exam of your legs by your caregiver. He or she may recommend an ultrasound of your leg veins. TREATMENT  Most varicose veins can be treated at home.However, other treatments are available for people who have persistent symptoms or who want to treat the cosmetic appearance of the varicose veins. These include:  Laser treatment of very small varicose veins.  Medicine that is shot (injected) into the vein. This medicine hardens the walls of the vein and closes off the vein. This treatment is called sclerotherapy. Afterwards, you may need to wear clothing or bandages that apply pressure.  Surgery. HOME CARE INSTRUCTIONS   Do not stand or sit in one position for long periods of time. Do not sit with your legs crossed. Rest with your legs raised during the day.  Wear elastic stockings or support hose. Do not wear other tight, encircling garments around the legs, pelvis, or waist.  Walk as much as possible to increase blood flow.  Raise the foot of your bed at night with 2-inch blocks.  If you get a cut in the skin over the vein and the vein bleeds, lie down with your leg raised and press on it with a clean cloth until the bleeding stops. Then  place a bandage (dressing) on the cut. See your caregiver if it continues to bleed or needs stitches. SEEK MEDICAL CARE IF:   The skin around your ankle starts to break down.  You have pain, redness, tenderness, or hard swelling developing in your leg over a vein.  You are uncomfortable due to leg pain. Document Released: 03/12/2005 Document Revised: 08/25/2011 Document Reviewed: 07/29/2010 [facility] Patient Information 2013 [place], [place].   Flexor Digitorum Profundus Rupture ([place] Finger) Flexor digitorum profundus rupture, commonly called [place] finger, is a condition where a person is unable to bend his or her own finger, without assistance. This is caused by injury to the tendon that bends the last joint of the finger. The tendon tears (ruptures), and sometimes pulls a piece of bone off with it. This is called an avulsion fracture. The injury is called [place] finger, because it often occurs when a player grabs another player's [place] or pants. SYMPTOMS   A "pop" or rip felt in the finger, at the time of injury.  Pain when moving the finger.  Inability to bend the finger, without assistance.  Full passive motion of the finger (can be bent with help).  Tenderness, swelling, and warmth of the injured finger.  Bruising after 48 hours.  Sometimes, a lump felt in the palm of the hand. CAUSES  The most common cause is forced straightening (extension) of a bent (flexed) finger. The force on the tendon is greater than it  can withstand, causing it to rupture. [place] finger is less commonly the result of a cut (laceration). RISK INCREASES WITH:  Sports that involve grasping an object, such as a [place] (rugby, football (during tackling), and ice hockey when gloves are removed and a player grabs another player's [place]).  Poor hand strength and flexibility.  Previous finger injury. PREVENTION   Warm up and stretch properly before activity.  Maintain physical fitness:  Hand and  finger flexibility.  Muscle strength and endurance.  Taping, splinting, or protective strapping may be advised before activity.  Learn and use proper tackling technique. PROGNOSIS  [place] finger often requires surgery, followed by a 3 month recovery. RELATED COMPLICATIONS   Permanent deformity (inability to bend finger).  Stiffness of finger.  If untreated, unstable last joint.  Poor finger function.  Repeated rupture of the tendon.  Pain or weakness with gripping.  Prolonged disability.  Arthritis of the finger, especially if associated with a fracture.  Risks of surgery: infection, injury to nerves (numbness, weakness), bleeding, weakness, recurring tendon injury, finger stiffness. TREATMENT  [place] finger usually requires surgery. Before surgery, treatment involves restraint and ice and medicine, to reduce pain and inflammation. Surgery will involve reattaching the tendon to the bone. After surgery, the hand is restrained for protection during the healing process. Stretching and strengthening exercises will be needed, for you to regain strength and a full range of motion. Exercises may be completed at home or with a therapist. MEDICATION   If pain medicine is needed, nonsteroidal anti-inflammatory medicines (aspirin and ibuprofen), or other minor pain relievers (acetaminophen), are often advised.  Do not take pain medicine for 7 days before surgery.  Prescription pain relievers may be given. Use only as directed and only as much as you need. SEEK MEDICAL CARE IF:   Pain increases, despite treatment.  Any of the following occur after surgery:  You experience pain, numbness, or coldness in the finger.  Blue, gray, or dark color appears in the fingernails.  You develop signs of infection: fever, increased pain, swelling, redness, drainage of fluids, or bleeding in the affected area.  New, unexplained symptoms develop. (Drugs used in treatment may produce side  effects.) Document Released: 06/02/2005 Document Revised: 08/25/2011 Document Reviewed: 09/14/2008 [facility] Patient Information 2013 [place], [place].

## 2012-09-16 NOTE — Assessment & Plan Note (Signed)
Refer to vascular surgeon.

## 2012-09-17 ENCOUNTER — Ambulatory Visit (INDEPENDENT_AMBULATORY_CARE_PROVIDER_SITE_OTHER)
Admission: RE | Admit: 2012-09-17 | Discharge: 2012-09-17 | Disposition: A | Discharge status: Home / Self Care | Payer: No Typology Code available for payment source | Source: Ambulatory Visit | Attending: Family Medicine | Admitting: Family Medicine

## 2012-09-17 ENCOUNTER — Other Ambulatory Visit (INDEPENDENT_AMBULATORY_CARE_PROVIDER_SITE_OTHER): Payer: No Typology Code available for payment source

## 2012-09-17 DIAGNOSIS — I83813 Varicose veins of bilateral lower extremities with pain: Secondary | ICD-10-CM

## 2012-09-17 DIAGNOSIS — R739 Hyperglycemia, unspecified: Secondary | ICD-10-CM

## 2012-09-17 DIAGNOSIS — M79609 Pain in unspecified limb: Secondary | ICD-10-CM

## 2012-09-17 DIAGNOSIS — I83893 Varicose veins of bilateral lower extremities with other complications: Secondary | ICD-10-CM

## 2012-09-17 DIAGNOSIS — R7309 Other abnormal glucose: Secondary | ICD-10-CM

## 2012-09-17 DIAGNOSIS — R51 Headache: Secondary | ICD-10-CM

## 2012-09-17 DIAGNOSIS — I10 Essential (primary) hypertension: Secondary | ICD-10-CM

## 2012-09-17 DIAGNOSIS — M79644 Pain in right finger(s): Secondary | ICD-10-CM

## 2012-09-17 DIAGNOSIS — K649 Unspecified hemorrhoids: Secondary | ICD-10-CM

## 2012-09-17 LAB — HEPATIC FUNCTION PANEL
ALT: 22 U/L (ref 0–53)
AST: 17 U/L (ref 0–37)
Albumin: 4.2 g/dL (ref 3.5–5.2)
Alkaline Phosphatase: 74 U/L (ref 39–117)
Bilirubin, Direct: 0.1 mg/dL (ref 0.0–0.3)
Total Bilirubin: 0.7 mg/dL (ref 0.3–1.2)
Total Protein: 7.3 g/dL (ref 6.0–8.3)

## 2012-09-17 LAB — BASIC METABOLIC PANEL
BUN: 16 mg/dL (ref 6–23)
CO2: 29 mEq/L (ref 19–32)
Calcium: 8.9 mg/dL (ref 8.4–10.5)
Chloride: 103 mEq/L (ref 96–112)
Creatinine, Ser: 1.1 mg/dL (ref 0.4–1.5)
GFR: 73.16 mL/min (ref >60.00)
Glucose, Bld: 103 mg/dL — ABNORMAL HIGH (ref 70–99)
Potassium: 4.2 mEq/L (ref 3.5–5.1)
Sodium: 138 mEq/L (ref 135–145)

## 2012-09-17 LAB — LIPID PANEL
Cholesterol: 140 mg/dL (ref 0–200)
HDL: 32.1 mg/dL — ABNORMAL LOW (ref >39.00)
LDL Cholesterol: 84 mg/dL (ref 0–99)
Total CHOL/HDL Ratio: 4
Triglycerides: 121 mg/dL (ref 0.0–149.0)
VLDL: 24.2 mg/dL (ref 0.0–40.0)

## 2012-09-17 LAB — CBC WITH DIFFERENTIAL/PLATELET
Basophils Absolute: 0 10*3/uL (ref 0.0–0.1)
Basophils Relative: 0.3 % (ref 0.0–3.0)
Eosinophils Absolute: 0.2 10*3/uL (ref 0.0–0.7)
Eosinophils Relative: 2 % (ref 0.0–5.0)
HCT: 42.1 % (ref 39.0–52.0)
Hemoglobin: 14.1 g/dL (ref 13.0–17.0)
Lymphocytes Relative: 22.4 % (ref 12.0–46.0)
Lymphs Abs: 2.3 10*3/uL (ref 0.7–4.0)
MCHC: 33.5 g/dL (ref 30.0–36.0)
MCV: 88.6 fl (ref 78.0–100.0)
Monocytes Absolute: 0.8 10*3/uL (ref 0.1–1.0)
Monocytes Relative: 8 % (ref 3.0–12.0)
Neutro Abs: 6.9 10*3/uL (ref 1.4–7.7)
Neutrophils Relative %: 67.3 % (ref 43.0–77.0)
Platelets: 299 10*3/uL (ref 150.0–400.0)
RBC: 4.75 Mil/uL (ref 4.22–5.81)
RDW: 13.9 % (ref 11.5–14.6)
WBC: 10.2 10*3/uL (ref 4.5–10.5)

## 2012-09-17 LAB — TSH: TSH: 2.43 u[IU]/mL (ref 0.35–5.50)

## 2012-09-17 LAB — MICROALBUMIN / CREATININE URINE RATIO
Creatinine,U: 273 mg/dL
Microalb Creat Ratio: 0.4 mg/g (ref 0.0–30.0)
Microalb, Ur: 1.1 mg/dL (ref 0.0–1.9)

## 2012-09-17 LAB — HEMOGLOBIN A1C: Hgb A1c MFr Bld: 6.7 % — ABNORMAL HIGH (ref 4.6–6.5)

## 2012-09-20 ENCOUNTER — Telehealth: Payer: Self-pay | Admitting: *Deleted

## 2012-09-20 NOTE — Telephone Encounter (Signed)
Returned [name] phone call regarding questions about varicose veins, leg pain, and compression stockings.  [name] was seen by his primary care physician, Dr. Laury Axon, who prescribed thigh high compression stockings 20-30 mm Hg.  Discussed care and application of compression hose with [name] and discussed suggestions of where to purchase them.  [name] states he will purchase the thigh high compression stockings today (09-20-2012) and will  begin wearing them today (09-20-2012) as instructed by his primary care physician.  [name] has a new VV consult appointment with Dr. Waverly Ferrari on 11-03-2012.  Explained 3 month conservative treatment plan of 1.  Wearing thigh high compression hose 20-30 mm HG for 3 months  2.  Frequent elevation of legs 3.  Ibuprofen 600 mg three times daily with meals prn leg pain.  [name] will begin 3 month period of conservative therapy/treatment today (09-20-2012).     [name] verbalized understanding of all instructions.

## 2012-09-21 ENCOUNTER — Other Ambulatory Visit: Payer: Self-pay

## 2012-09-21 ENCOUNTER — Other Ambulatory Visit: Payer: Self-pay | Admitting: *Deleted

## 2012-09-21 DIAGNOSIS — E119 Type 2 diabetes mellitus without complications: Secondary | ICD-10-CM

## 2012-09-21 DIAGNOSIS — I83893 Varicose veins of bilateral lower extremities with other complications: Secondary | ICD-10-CM

## 2012-09-23 ENCOUNTER — Encounter: Payer: Self-pay | Admitting: Family Medicine

## 2012-09-27 ENCOUNTER — Other Ambulatory Visit: Payer: Self-pay | Admitting: Family Medicine

## 2012-10-03 ENCOUNTER — Other Ambulatory Visit: Payer: Self-pay | Admitting: Family Medicine

## 2012-10-04 ENCOUNTER — Encounter (INDEPENDENT_AMBULATORY_CARE_PROVIDER_SITE_OTHER): Payer: Self-pay | Admitting: Surgery

## 2012-10-04 ENCOUNTER — Ambulatory Visit (INDEPENDENT_AMBULATORY_CARE_PROVIDER_SITE_OTHER): Payer: BC Managed Care – PPO | Admitting: Surgery

## 2012-10-04 VITALS — BP 134/78 | HR 76 | Temp 98.5°F | Resp 16 | Ht 70.5 in | Wt 188.0 lb

## 2012-10-04 DIAGNOSIS — K644 Residual hemorrhoidal skin tags: Secondary | ICD-10-CM

## 2012-10-04 DIAGNOSIS — K649 Unspecified hemorrhoids: Secondary | ICD-10-CM

## 2012-10-04 DIAGNOSIS — K648 Other hemorrhoids: Secondary | ICD-10-CM

## 2012-10-04 HISTORY — DX: Other hemorrhoids: K64.4

## 2012-10-04 MED ORDER — HYDROCORTISONE ACETATE 25 MG RE SUPP: 25.0000 mg | Freq: Two times a day (BID) | RECTAL | Status: DC

## 2012-10-04 NOTE — Progress Notes (Signed)
Patient ID: Chase [name], male   DOB: 1960-09-21, 52 y.o.   MRN: [id number]  Chief Complaint  Patient presents with  . New Evaluation    eval hems    HPI Chase [name] is a 52 y.o. male.  Referred by Dr. Laury Axon for evaluation of hemorrhoids HPI 52 year old male with multiple medical problems who has had a long history of problems with hemorrhoids. He was last seen in 2012. He has had a previous injection by another surgeon in 2005. He states that he has bowel movements every day but that he sits for more than 30 minutes each time. He blames this on "my body is just slow". He is also having problems with varicose veins in his lower extremities and is wearing support stockings. He is using Anusol suppositories as well as Analpram ointment. He reports occasionally seeing some blood and some prolapse of internal hemorrhoids.  Past Medical History  Diagnosis Date  . Allergy   . Stroke   . Sleep apnea   . Headache   . Arthritis   . GERD (gastroesophageal reflux disease)   . Hypertension   . Hyperlipidemia   . Hearing loss   . Nasal congestion   . Trouble swallowing   . Blood in stool   . Chest pain   . Leg swelling   . Constipation   . Rectal pain   . Difficulty urinating   . Thrombosed hemorrhoids   . Brain cyst   . PONV (postoperative nausea and vomiting)     Past Surgical History  Procedure Laterality Date  . Cataract extraction      x 3  . Inner ear surgery      rt ear  . Eye surgery  1968, 1985, 1987    cataracts  . Inner ear surgery  1992  . Surgery - right arm      1983    Family History  Problem Relation Age of Onset  . Cancer Mother 51    breast  . Stroke Father   . Heart disease Father     Social History History  Substance Use Topics  . Smoking status: Never Smoker   . Smokeless tobacco: Never Used  . Alcohol Use: No    Allergies  Allergen Reactions  . Amoxicillin Hives, Itching and Other (See Comments)    White tongue; ? hives  . Terfenadine      ? reaction    Current Outpatient Prescriptions  Medication Sig Dispense Refill  . allopurinol (ZYLOPRIM) 100 MG tablet Take 1 tablet (100 mg total) by mouth daily.  90 tablet  1  . atorvastatin (LIPITOR) 10 MG tablet Take 1 tablet (10 mg total) by mouth daily.  90 tablet  0  . azelastine (ASTELIN) 137 MCG/SPRAY nasal spray Place 2 sprays into the nose 2 (two) times daily. Use in each nostril as directed  30 mL  1  . Beclomethasone Dipropionate (QNASL) 80 MCG/ACT AERS Place 2 sprays into the nose daily.      . diazepam (VALIUM) 5 MG tablet TAKE 1 TABLET BY MOUTH 3 TIMES A DAY  90 tablet  1  . fluticasone (FLONASE) 50 MCG/ACT nasal spray       . HYDROcodone-acetaminophen (HYCET) 7.5-325 mg/15 ml solution TAKE 15 MLS BY MOUTH 4 TIMES A DAY AS NEEDED FOR PAIN  120 mL  0  . HYDROcodone-acetaminophen (HYCET) 7.5-325 mg/15 ml solution Take 15 mLs by mouth every 6 (six) hours as needed for pain.  120 mL  0  . HYDROcodone-homatropine (HYCODAN) 5-1.5 MG/5ML syrup TAKE 1 TEASPOONFUL BY MOUTH EVERY 8 HOURS AS NEEDED FOR COUGH  120 mL  0  . hydrocortisone (ANUSOL-HC) 25 MG suppository Place 1 suppository (25 mg total) rectally 2 (two) times daily.  12 suppository  0  . hydrocortisone-pramoxine (ANALPRAM-HC) 2.5-1 % rectal cream Place 1 application rectally 2 (two) times daily as needed. For hemorroids.  30 g  2  . hydrOXYzine (ATARAX/VISTARIL) 25 MG tablet Take 25 mg by mouth at bedtime.      . hydrOXYzine (ATARAX/VISTARIL) 25 MG tablet TAKE 1 TABLET AT BEDTIME  30 tablet  6  . meclizine (ANTIVERT) 25 MG tablet Take 1 tablet (25 mg total) by mouth 3 (three) times daily as needed.  60 tablet  0  . metoprolol tartrate (LOPRESSOR) 25 MG tablet Take 1 tablet (25 mg total) by mouth 2 (two) times daily.  60 tablet  2  . Multiple Vitamin (MULTIVITAMIN) tablet Take 1 tablet by mouth daily. GummyMultivitamin      . Phenylephrine-APAP-Guaifenesin (MUCINEX FAST-MAX COLD & SINUS) 5-325-200 MG TABS Take by mouth.       . promethazine (PHENERGAN) 25 MG suppository Place 1 suppository (25 mg total) rectally 2 (two) times daily as needed for nausea.  30 each  0  . ranitidine (ZANTAC) 150 MG/10ML syrup Take 150 mg by mouth 2 (two) times daily as needed. For indigestion.      Marland Kitchen venlafaxine XR (EFFEXOR-XR) 37.5 MG 24 hr capsule Take 1 capsule (37.5 mg total) by mouth daily.  90 capsule  0  . ibuprofen (ADVIL,MOTRIN) 100 MG/5ML suspension Take 200 mg by mouth every 4 (four) hours as needed for fever.       No current facility-administered medications for this visit.    Review of Systems Review of Systems  Constitutional: Negative for fever, chills and unexpected weight change.  HENT: Negative for hearing loss, congestion, sore throat, trouble swallowing and voice change.   Eyes: Negative for visual disturbance.  Respiratory: Negative for cough and wheezing.   Cardiovascular: Negative for chest pain, palpitations and leg swelling.  Gastrointestinal: Positive for blood in stool, anal bleeding and rectal pain. Negative for nausea, vomiting, abdominal pain, diarrhea, constipation and abdominal distention.  Genitourinary: Negative for hematuria and difficulty urinating.  Musculoskeletal: Negative for arthralgias.  Skin: Negative for rash and wound.  Neurological: Negative for seizures, syncope, weakness and headaches.  Hematological: Negative for adenopathy. Does not bruise/bleed easily.  Psychiatric/Behavioral: Negative for confusion.    Blood pressure 134/78, pulse 76, temperature 98.5 F (36.9 C), temperature source Temporal, resp. rate 16, height 5' 10.5" (1.791 m), weight 188 lb (85.276 kg).  WDWN in NAD Rectal - small loose external hemorrhoids - no thrombosis or inflammation No anal fissure or abscess Moderate sized internal hemorrhoids without prolapse  Physical Exam Physical Exam  Data Reviewed none  Assessment    Internal and external hemorrhoids without complication. He may have some  intermittent prolapse. The root cause of all of these symptoms are due to the prolonged sitting on the toilet. This may actually be contributing to the varicose veins in his lower extremities as well. There are no surgical indications at this time.    Plan    The patient must limit the amount of time he spends on the toilet. We discussed a regimen of a daily fiber supplement as well as twice a day stool softeners to have softer bowel movements. The patient reports issues with swallowing  tablets so I encouraged him to speak with the pharmacist about a dissolvable fiber supplement and possibly a chewable stool softener. He should also use frequent sitz baths. He may continue the suppositories for now. Recheck in 4 weeks.         Breeonna Mone K. 10/04/2012, 10:46 AM

## 2012-10-04 NOTE — Telephone Encounter (Signed)
Med filled.  

## 2012-10-04 NOTE — Patient Instructions (Signed)
Daily fiber supplement Stool softeners (colace) twice a day as needed to keep bowel movements soft Frequent sitz baths  Do not try to have a bowel movement unless you have a significant urge to go.

## 2012-10-05 ENCOUNTER — Telehealth: Payer: Self-pay | Admitting: Family Medicine

## 2012-10-05 ENCOUNTER — Encounter: Payer: Self-pay | Admitting: Dietician

## 2012-10-05 ENCOUNTER — Encounter: Payer: No Typology Code available for payment source | Attending: Family Medicine | Admitting: Dietician

## 2012-10-05 VITALS — Ht 70.5 in | Wt 188.6 lb

## 2012-10-05 DIAGNOSIS — E119 Type 2 diabetes mellitus without complications: Secondary | ICD-10-CM

## 2012-10-05 DIAGNOSIS — Z713 Dietary counseling and surveillance: Secondary | ICD-10-CM | POA: Insufficient documentation

## 2012-10-05 NOTE — Telephone Encounter (Signed)
Patient states he needs rx for glucose meter and strips. He has a bayer contour next ez meter. He needs [place] to send in strips to see if his insurance is going to cover it.  Pt uses [facility] [address]

## 2012-10-05 NOTE — Patient Instructions (Addendum)
   When having the whole grain pasta and [place] with tomato sauce, keep at 1.5 cups and plan to have a non-starchy veggie with this meal.  Try using the steamers as  the non-starchy vegetable.  Try to keep the starch serving to 1 cup at meal time or 2 slices of whole grain bread or no more than a 6 inch sub roll.  Try to follow the serving sizes for fruit serving.  Just start to monitor the amount of starch and fruit and milk that you are eating.  Primarily monitor the amount of sweets (ice cream, cookies, chocolate milk, chocolate, candy) that you use.  These are carb and go to glucose.  Try to walk daily.  Aim for 150 minutes over 7 days.    Consider applying for a scholarship to the [facility] for use when the weather gets bad next fall and winter.  Try using the Splenda or the Stevia to sweeten rather than the sugar.  Avoid sugar sweetened beverages.  Aim for a weight loss of 9-18 lb over the next 4-6 months.  Plan to come back for an appointment for using the glucose meter or get the meter at Dr. Ernst Spell office.  (Will give you a meter today.)  Call Dr. Ernst Spell nurse and ask how often she wishes for you to check your glucose.  Also ask for a prescription for strips and lancets to be called into your pharmacy.chec

## 2012-10-05 NOTE — Progress Notes (Signed)
Medical Nutrition Therapy:  Appt start time: 0930 end time:  1130.   Assessment:  Primary concerns today: Comes today to learn more about the care of his now official type 2 diabetes. Has seen Ivonne Andrew, RD in our practice when he had pre-diabetes.  He admits that he attempted to make some changes in his diet but was not able to follow through with all of them.  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  He reports issues with depression.  He has not been employed full time for about 5-6 years.  He reports he has been advised to go on disability, but he is reductant to do so. In the course of the education session, he appears to have problems with remembering recent events.  Dietary recall for him was a laborious process and he needed many assistive promptings.  DILATED EYE EXAM:  No, not in the last 2 years.  DAILY FOOT SELF-EXAM:  Reviewed the need and the process.  HYPERGLYCEMIA: No S/S noted.  HYPOGLYCEMIA: No S/S noted.  BLOOD GLUCOSE MONITORING: Noted his MD wanted him to monitor but he had no order of monitoring or prescription for a meter.  Today, provided him with a Bayer Contour Next meter and instructed in the use.  Advised him to call MD for frequency in monitoring, a prescription for the strips and lancets.  Today at 11:45 AM (no food this AM) is glucose was 109 mg/dl  MEDICATIONS: Med review completed.  Diet and Exercise for DM therapy   DIETARY INTAKE:  Usual eating pattern includes 1-2-3 meals and 1-2 snacks per day.   Avoided foods include: Cookies, ice cream, sweetened tea, chocolate milk.    24-hr recall:  B ( AM): Sometimes nothing, cereal honey bunches of oats/strawberries about 1.5 cups with skim milk sometimes.   Snk ( AM): cookie or ice cream. (trying to eat less) L ( PM): sometimes, [place] and whole grain pasta with tomato sauce.  OR [place] meat on wheat bread or  sometimes rare use ow whole grain bread or fish sandwich on bread with nothing added.  OR viola meal. (chicken and pasta or chicken and veggies) water or sweet ice tea or Silk,  almond dark chocolate milk Snk ( PM): ice cream or cookies D ( PM): tuna sub with american cheese, tomatoes, sometimes spinach ans honey mustard sauce or garlic bread or 9 grain honey oat. (6 inch) sweet ice tea, or water, or Silk milk. Snk ( PM): ice cream or cookies or sun chip or harvest cheddar chips . Beverages: water, Silk almond dark chocolate milk, sweet tea, rare lemonade rather than the tea.  Usual physical activity: went for a walk yesterday and does mow a neighbors's lawn about once a week or so.    Estimated energy needs:  HT: 70.5 in  WT: 188.6 lb  BMI: 26.7 kg/m2  Adj WT: 176 lb  (80 kg) 1600 calories 175-180 g carbohydrates 120 g protein 40-44 g fat  Progress Towards Goal(s):  In progress.   Nutritional Diagnosis:  [place]-2.1 Inpaired nutrition utilization As related to glucose.  As evidenced by new diagnosis of diabetes type 2 with an A1C at 6.7%.   Goal: To try to eat a better diet lower in carbohydrate, walk more and lose a little weight to lower my glucose levels.  Intervention:  Nutrition/Diabetes  When having the whole grain pasta and  [place] with tomato sauce, keep at 1.5 cups and plan to have a non-starchy veggie with this meal.  Try using the steamers as  the non-starchy vegetable.  Try to keep the starch serving to 1 cup at meal time or 2 slices of whole grain bread or no more than a 6 inch sub roll.  Try to follow the serving sizes for fruit serving.  Just start to monitor the amount of starch and fruit and milk that you are eating.  Primarily monitor the amount of sweets (ice cream, cookies, chocolate milk, chocolate, candy) that you use.  These are carb and go to glucose.  Try to walk daily.  Aim for 150 minutes over 7 days.    Consider applying for a scholarship to the [facility] for  use when the weather gets bad next fall and winter.  Try using the Splenda or the Stevia to sweeten rather than the sugar.  Avoid sugar sweetened beverages.  Aim for a weight loss of 9-18 lb over the next 4-6 months.  Plan to come back for an appointment for using the glucose meter or get the meter at Dr. Ernst Spell office.  (Will give you a meter today.)  Call Dr. Ernst Spell nurse and ask how often she wishes for you to check your glucose.  Also ask for a prescription for strips and lancets to be called into your pharmacy.   Handouts given during visit include:  Living Well with Diabetes  Novo Nordisk Carb Counting Guide  Yellow Card with Exchanges (Will go into the diet prescription at a later appointment so that things are not so overwhelming)  Snack List  Monitoring/Evaluation:  Dietary intake, exercise, blood glucose levels, and body weight in 6-8 weeks.

## 2012-10-06 ENCOUNTER — Encounter: Payer: Self-pay | Admitting: Neurology

## 2012-10-06 ENCOUNTER — Ambulatory Visit (INDEPENDENT_AMBULATORY_CARE_PROVIDER_SITE_OTHER): Payer: BC Managed Care – PPO | Admitting: Family Medicine

## 2012-10-06 ENCOUNTER — Other Ambulatory Visit: Payer: Self-pay | Admitting: General Practice

## 2012-10-06 ENCOUNTER — Ambulatory Visit (INDEPENDENT_AMBULATORY_CARE_PROVIDER_SITE_OTHER): Payer: No Typology Code available for payment source | Admitting: Neurology

## 2012-10-06 ENCOUNTER — Encounter: Payer: Self-pay | Admitting: Family Medicine

## 2012-10-06 ENCOUNTER — Ambulatory Visit (HOSPITAL_COMMUNITY)
Admission: RE | Admit: 2012-10-06 | Discharge: 2012-10-06 | Disposition: A | Discharge status: Home / Self Care | Payer: BC Managed Care – PPO | Source: Ambulatory Visit | Attending: Neurology | Admitting: Neurology

## 2012-10-06 VITALS — BP 140/82 | HR 70 | Temp 97.4°F | Resp 12 | Ht 70.0 in | Wt 189.0 lb

## 2012-10-06 VITALS — BP 132/94 | HR 71 | Temp 98.4°F | Wt 190.4 lb

## 2012-10-06 DIAGNOSIS — G930 Cerebral cysts: Secondary | ICD-10-CM | POA: Insufficient documentation

## 2012-10-06 DIAGNOSIS — R413 Other amnesia: Secondary | ICD-10-CM | POA: Insufficient documentation

## 2012-10-06 DIAGNOSIS — E1165 Type 2 diabetes mellitus with hyperglycemia: Secondary | ICD-10-CM

## 2012-10-06 DIAGNOSIS — Z5189 Encounter for other specified aftercare: Secondary | ICD-10-CM

## 2012-10-06 DIAGNOSIS — M79644 Pain in right finger(s): Secondary | ICD-10-CM

## 2012-10-06 DIAGNOSIS — Z8673 Personal history of transient ischemic attack (TIA), and cerebral infarction without residual deficits: Secondary | ICD-10-CM | POA: Insufficient documentation

## 2012-10-06 DIAGNOSIS — I6789 Other cerebrovascular disease: Secondary | ICD-10-CM | POA: Insufficient documentation

## 2012-10-06 DIAGNOSIS — E1065 Type 1 diabetes mellitus with hyperglycemia: Secondary | ICD-10-CM

## 2012-10-06 DIAGNOSIS — G8929 Other chronic pain: Secondary | ICD-10-CM

## 2012-10-06 DIAGNOSIS — M79609 Pain in unspecified limb: Secondary | ICD-10-CM

## 2012-10-06 DIAGNOSIS — E348 Other specified endocrine disorders: Secondary | ICD-10-CM

## 2012-10-06 DIAGNOSIS — S63610D Unspecified sprain of right index finger, subsequent encounter: Secondary | ICD-10-CM

## 2012-10-06 DIAGNOSIS — E118 Type 2 diabetes mellitus with unspecified complications: Secondary | ICD-10-CM

## 2012-10-06 DIAGNOSIS — R51 Headache: Secondary | ICD-10-CM

## 2012-10-06 HISTORY — DX: Pain in right finger(s): M79.644

## 2012-10-06 MED ORDER — ONETOUCH DELICA LANCETS FINE MISC: 1.0000 | Freq: Four times a day (QID) | Status: DC

## 2012-10-06 MED ORDER — BAYER MICROLET LANCETS MISC: Status: DC

## 2012-10-06 MED ORDER — GADOBENATE DIMEGLUMINE 529 MG/ML IV SOLN
17.0000 mL | Freq: Once | INTRAVENOUS | Status: AC | PRN
  Administered 2012-10-06: 17 mL via INTRAVENOUS

## 2012-10-06 MED ORDER — GLUCOSE BLOOD VI STRP: ORAL_STRIP | Status: DC

## 2012-10-06 NOTE — Progress Notes (Signed)
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  He has had serial scans showing stability of the size of the pineal cyst.  He is also had a few white matter lesions up up which admitted she related to possible small vessel disease.  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 He was admitted to the hospital for 2 days and then went to rehabilitation where physical therapy did help a lot and over weeks or months the symptoms diminished. He now takes meclizine rarely. He was never determined that this was in her ear or brain problem. He also has a history of back pain, hypertension and kidney stones. He also complains of relatively frequent headaches of about 3 different types. When he gets a mental block he gets a pain on the top of his head. He also gets frontal and facial and sinus headaches and last for hours at maybe associated with some visual sensitivity, but not a classic migraine. Sinus medications and analgesics and nonsteroidals  Somewhat alleviate these headaches. He also complains of TMJ pain.  At this time one of his main complaint is memory loss and he just can't keep his thoughts organized remember things and this may be going on for a couple of years. He also complains of muscle aches and is wondering whether the Lipitor could be affecting his muscles or his memory.  His mother had some memory issues. He has a five-year younger brother who does not have memory problems.  He also has a history of hearing loss in both years for a long time and he also had a history of childhood cataracts and he briefly lost vision in both eyes. He had surgery on the left eye around the age of 50 which was successful. Surgery twice in the right eye which was much  less successful and he has amblyopia.  Review of symptoms is positive for nasal allergies, stomach acid, joint pains, insomnia and difficulty with urination at times. Remainder of the review of systems is unremarkable.  Past Medical History  Diagnosis Date  . Allergy   . Stroke   . Sleep apnea   . Headache   . Arthritis   . GERD (gastroesophageal reflux disease)   . Hypertension   . Hyperlipidemia   . Hearing loss   . Nasal congestion   . Trouble swallowing   . Blood in stool   . Chest pain   . Leg swelling   . Constipation   . Rectal pain   . Difficulty urinating   . Thrombosed hemorrhoids   . Brain cyst   . PONV (postoperative nausea and vomiting)     Current Outpatient Prescriptions on File Prior to Visit  Medication Sig Dispense Refill  . allopurinol (ZYLOPRIM) 100 MG tablet Take 1 tablet (100 mg total) by mouth daily.  90 tablet  1  . atorvastatin (LIPITOR) 10 MG tablet Take 1 tablet (10 mg total) by mouth daily.  90 tablet  0  . azelastine (ASTELIN) 137 MCG/SPRAY nasal spray PLACE 2 SPRAYS INTO THE NOSE TWICE A DAY. PUT IN EACH NOSTRIL AS DIRECTED  30 mL  1  . Beclomethasone Dipropionate (QNASL) 80 MCG/ACT AERS Place 2 sprays into the nose daily.      Marland Kitchen  diazepam (VALIUM) 5 MG tablet TAKE 1 TABLET BY MOUTH 3 TIMES A DAY  90 tablet  1  . glucose blood (BAYER CONTOUR NEXT TEST) test strip Test blood sugar daily dx 250.00  100 each  12  . HYDROcodone-acetaminophen (HYCET) 7.5-325 mg/15 ml solution TAKE 15 MLS BY MOUTH 4 TIMES A DAY AS NEEDED FOR PAIN  120 mL  0  . HYDROcodone-acetaminophen (HYCET) 7.5-325 mg/15 ml solution Take 15 mLs by mouth every 6 (six) hours as needed for pain.  120 mL  0  . hydrocortisone (ANUSOL-HC) 25 MG suppository Place 1 suppository (25 mg total) rectally 2 (two) times daily.  12 suppository  0  . hydrocortisone-pramoxine (ANALPRAM-HC) 2.5-1 % rectal cream Place 1 application rectally 2 (two) times daily as needed. For hemorroids.  30 g  2  .  hydrOXYzine (ATARAX/VISTARIL) 25 MG tablet Take 25 mg by mouth at bedtime.      . hydrOXYzine (ATARAX/VISTARIL) 25 MG tablet TAKE 1 TABLET AT BEDTIME  30 tablet  6  . ibuprofen (ADVIL,MOTRIN) 100 MG/5ML suspension Take 200 mg by mouth every 4 (four) hours as needed for fever.      . metoprolol tartrate (LOPRESSOR) 25 MG tablet Take 1 tablet (25 mg total) by mouth 2 (two) times daily.  60 tablet  2  . Multiple Vitamin (MULTIVITAMIN) tablet Take 1 tablet by mouth daily. GummyMultivitamin      . Phenylephrine-APAP-Guaifenesin (MUCINEX FAST-MAX COLD & SINUS) 5-325-200 MG TABS Take by mouth.      . promethazine (PHENERGAN) 25 MG suppository Place 1 suppository (25 mg total) rectally 2 (two) times daily as needed for nausea.  30 each  0  . ranitidine (ZANTAC) 150 MG/10ML syrup Take 150 mg by mouth 2 (two) times daily as needed. For indigestion.      Marland Kitchen venlafaxine XR (EFFEXOR-XR) 37.5 MG 24 hr capsule Take 1 capsule (37.5 mg total) by mouth daily.  90 capsule  0  . fluticasone (FLONASE) 50 MCG/ACT nasal spray       . HYDROcodone-homatropine (HYCODAN) 5-1.5 MG/5ML syrup TAKE 1 TEASPOONFUL BY MOUTH EVERY 8 HOURS AS NEEDED FOR COUGH  120 mL  0  . meclizine (ANTIVERT) 25 MG tablet Take 1 tablet (25 mg total) by mouth 3 (three) times daily as needed.  60 tablet  0   No current facility-administered medications on file prior to visit.   Amoxicillin and Terfenadine allergies History   Social History  . Marital Status: Single    Spouse Name: N/A    Number of Children: N/A  . Years of Education: N/A   Occupational History  . Not on file.   Social History Main Topics  . Smoking status: Never Smoker   . Smokeless tobacco: Never Used  . Alcohol Use: No     Comment: none  . Drug Use: No  . Sexually Active: No   Other Topics Concern  . Not on file   Social History Narrative  . No narrative on file    Family History  Problem Relation Age of Onset  . Cancer Mother 68    breast  . Stroke Father    . Heart disease Father     BP 140/82  Pulse 70  Temp(Src) 97.4 F (36.3 C)  Resp 12  Ht 5\' 10"  (1.778 m)  Wt 189 lb (85.73 kg)  BMI 27.12 kg/m2   Alert and oriented x 3.  Memory function appears to be intact.  Concentration and attention are normal for  educational level and background.  Speech is fluent and without significant word finding difficulty.  Is aware of current events.  No carotid bruits detected.  Cranial nerve II through XII reveals the presence of hearing aids and glasses and he has amblyopia with a lazy right eye. Motor strength is 5 over 5 throughout all limbs.  No atrophy, abnormal tone or tremors. Reflexes are 2+ and symmetric in the upper and lower extremities Sensory exam is intact. Coordination is intact for fine movements and rapid alternating movements in all limbs Gait and station are normal, although a little stiff and a limp is present.   Impression: 1. 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  I wonder if this could be having some relationship to the headaches that are on the top of the center of the head. 2. Couple of white matter lesions which may be due to small vessel disease.  However the shape of the linear internal capsule lesion seen in the latest scan of the prior scans would be not the most typical for a lacunar infarct. 3. Chronic headaches 4. Muscle aches 5. Memory dysfunction but overall he has preserved a level of intelligence it is not consistent with a dementia.  Plan: 1. We will repeat MRI to look at the white matter lesions as well as the pineal cyst. 2. Discussed the possibility of active puncture for the chronic headaches.  He will take this under advisement. In the meantime he can continue his usual medications that he has been getting some relief with. 3. If the muscle aches are significant, he could consider a few weeks off of the Lipitor to see if the muscle  aches could be related to the medication, and whether consideration should be given to a different treatment strategy. 4.  Return in 6 weeks

## 2012-10-06 NOTE — Assessment & Plan Note (Signed)
Refer to sports med con't splint

## 2012-10-06 NOTE — Progress Notes (Signed)
  Subjective:    Patient ID: Chase [name], male    DOB: [birth year], 52 y.o.   MRN: [id number]  HPI Pt here f/u R index finger--- still swollen and painful. Pt also has a lot of questions about diabetes.  He was diagnosed after last labs done.   No other complaints.   Review of Systems As above    Objective:   Physical Exam  BP 132/94  Pulse 71  Temp(Src) 98.4 F (36.9 C) (Oral)  Wt 190 lb 6.4 oz (86.365 kg)  BMI 27.32 kg/m2  SpO2 95% General appearance: alert, cooperative, appears stated age and no distress Extremities: tr edema b/l compression socks on                     r index finger still swollen and tender      Assessment & Plan:

## 2012-10-06 NOTE — Assessment & Plan Note (Signed)
Vidal Schwalbe RN--educated on diabetes  And new glucometer given

## 2012-10-06 NOTE — Patient Instructions (Addendum)
Diabetes and Foot Care Diabetes may cause you to have a poor blood supply (circulation) to your legs and feet. Because of this, the skin may be thinner, break easier, and heal more slowly. You also may have nerve damage in your legs and feet causing decreased feeling. You may not notice minor injuries to your feet that could lead to serious problems or infections. Taking care of your feet is one of the most important things you can do for yourself.  HOME CARE INSTRUCTIONS  Do not go barefoot. Bare feet are easily injured.  Check your feet daily for blisters, cuts, and redness.  Wash your feet with warm water (not hot) and mild soap. Pat your feet and between your toes until completely dry.  Apply a moisturizing lotion that does not contain alcohol or petroleum jelly to the dry skin on your feet and to dry brittle toenails. Do not put it between your toes.  Trim your toenails straight across. Do not dig under them or around the cuticle.  Do not cut corns or calluses, or try to remove them with medicine.  Wear clean cotton socks or stockings every day. Make sure they are not too tight. Do not wear knee high stockings since they may decrease blood flow to your legs.  Wear leather shoes that fit properly and have enough cushioning. To break in new shoes, wear them just a few hours a day to avoid injuring your feet.  Wear shoes at all times, even in the house.  Do not cross your legs. This may decrease the blood flow to your feet.  If you find a minor scrape, cut, or break in the skin on your feet, keep it and the skin around it clean and dry. These areas may be cleansed with mild soap and water. Do not use peroxide, alcohol, iodine or Merthiolate.  When you remove an adhesive bandage, be sure not to harm the skin around it.  If you have a wound, look at it several times a day to make sure it is healing.  Do not use heating pads or hot water bottles. Burns can occur. If you have lost feeling  in your feet or legs, you may not know it is happening until it is too late.  Report any cuts, sores or bruises to your caregiver. Do not wait! SEEK MEDICAL CARE IF:   You have an injury that is not healing or you notice redness, numbness, burning, or tingling.  Your feet always feel cold.  You have pain or cramps in your legs and feet. SEEK IMMEDIATE MEDICAL CARE IF:   There is increasing redness, swelling, or increasing pain in the wound.  There is a red line that goes up your leg.  Pus is coming from a wound.  You develop an unexplained oral temperature above 102 F (38.9 C), or as your caregiver suggests.  You notice a bad smell coming from an ulcer or wound. MAKE SURE YOU:   Understand these instructions.  Will watch your condition.  Will get help right away if you are not doing well or get worse. Document Released: 05/30/2000 Document Revised: 08/25/2011 Document Reviewed: 12/06/2008 [facility] Patient Information 2013 [place], [place].   Diabetes and Standards of Medical Care  Diabetes is complicated. You may find that your diabetes team includes a dietitian, nurse, diabetes educator, eye doctor, and more. To help everyone know what is going on and to help you get the care you deserve, the following schedule of care  was developed to help keep you on track. Below are the tests, exams, vaccines, medicines, education, and plans you will need. A1c test  Performed at least 2 times a year if you are meeting treatment goals.  Performed 4 times a year if therapy has changed or if you are not meeting therapy/glycemic goals. Aspirin medicine  Take daily as directed by your caregiver. Blood pressure test  Performed at every routine medical visit. The goal is less than 130/80 mm/Hg. Dental exam  Get a dental exam at least 2 times a year. Dilated eye exam (retinal exam)  Type 1 diabetes: Get an exam within 5 years of diagnosis and then yearly.  Type 2 diabetes: Get an exam  at diagnosis and then yearly. All exams thereafter can be extended to every 2 to 3 years if one or more exams have been normal. Foot care exam  Visual foot exams are performed at every routine medical visit. The exams check for cuts, injuries, or other problems with the feet.  A comprehensive foot exam should be done yearly. This includes visual inspection as well as assessing foot pulses and testing for loss of sensation. Kidney function test (urine microalbumin)  Performed once a year.  Type 1 diabetes: The first test is performed 5 years after diagnosis.  Type 2 diabetes: The first test is performed at the time of diagnosis.  A serum creatinine and estimated glomerular filtration rate (eGFR) test is done once a year to tell the level of chronic kidney disease (CKD), if present. Lipid profile (Cholesterol, HDL, LDL, Triglycerides)  Performed once a year for most people. If at low risk, may be assessed every 2 years.  The goal for LDL is less than 100 mg/dl. If at high risk, the goal is less than 70 mg/dl.  The goal for HDL is higher than 40 mg/dl for men and higher than 50 mg/dl for women.  The goal for triglycerides is less than 150 mg/dl. Flu vaccine, pneumonia vaccine, and hepatitis B vaccine  The flu vaccine is recommended yearly.  The pneumonia vaccine is generally given once in a lifetime. However, there are some instances where another vaccine is recommended. Check with your caregiver.  The hepatitis B vaccine is also recommended for adults with diabetes. Diabetes self-management education  Recommended at diagnosis and ongoing as needed. Treatment plan  Reviewed at every medical visit. Document Released: 03/30/2009 Document Revised: 08/25/2011 Document Reviewed: 12/03/2010 [facility] Patient Information 2013 [place], [place].

## 2012-10-06 NOTE — Patient Instructions (Addendum)
Your MRI is scheduled for Friday, April 25th at 10:00am.  Please arrive to [facility] first floor admitting by 9:45am.  [phone].  We will see you back in six weeks.  You can stop the Lipitor during this time to see if your memory loss improves.

## 2012-10-06 NOTE — Telephone Encounter (Signed)
New glucose meter given to pt. Per pt request Verio IA strips and lancets sent to pharmacy.

## 2012-10-07 ENCOUNTER — Telehealth: Payer: Self-pay | Admitting: Family Medicine

## 2012-10-07 ENCOUNTER — Encounter: Payer: Self-pay | Admitting: Family Medicine

## 2012-10-07 NOTE — Telephone Encounter (Signed)
Patient came into office to pick up samples. He would like you to call him. CB# [phone].

## 2012-10-07 NOTE — Telephone Encounter (Signed)
Pt has more questions for BP for you- pt coming in this afternoon or you can call patient

## 2012-10-08 ENCOUNTER — Inpatient Hospital Stay (HOSPITAL_COMMUNITY): Admission: RE | Admit: 2012-10-08 | Payer: No Typology Code available for payment source | Source: Ambulatory Visit

## 2012-10-10 ENCOUNTER — Other Ambulatory Visit: Payer: Self-pay | Admitting: Family Medicine

## 2012-10-11 ENCOUNTER — Encounter: Payer: Self-pay | Admitting: Family Medicine

## 2012-10-11 ENCOUNTER — Ambulatory Visit (INDEPENDENT_AMBULATORY_CARE_PROVIDER_SITE_OTHER): Payer: BC Managed Care – PPO | Admitting: Family Medicine

## 2012-10-11 VITALS — BP 150/92 | HR 77 | Ht 70.0 in | Wt 188.0 lb

## 2012-10-11 DIAGNOSIS — M79609 Pain in unspecified limb: Secondary | ICD-10-CM

## 2012-10-11 DIAGNOSIS — M79644 Pain in right finger(s): Secondary | ICD-10-CM

## 2012-10-11 NOTE — Patient Instructions (Addendum)
You have strained the flexor tendon of your right index finger. You do not need to wear a splint for this. Ok to buddy tape it to your middle finger for support if you need to. Aleve 2 tabs twice a day with food OR ibuprofen 3 tabs three times a day with food for pain, swelling, and inflammation. Occupational therapy is the most important part of your treatment - do home exercises on days you do not go. Do not use heat as this is likely to increase swelling (they'll occasionally use it in therapy to help with spasms) - make sure you let them know about your Raynoud's each time you go. Follow up with me in 4 weeks for reevaluation.

## 2012-10-12 ENCOUNTER — Encounter: Payer: Self-pay | Admitting: Family Medicine

## 2012-10-12 NOTE — Progress Notes (Signed)
Subjective:    Patient ID: Chase [name], male    DOB: 23-May-1961, 52 y.o.   MRN: [id number]  PCP: Dr. Laury Axon  HPI 52 yo M here for right index finger injury.  Patient reports he was pulling his boots on on 4/1 when he felt a pop within right index finger with intense pain. Couldn't really localize where pain started - was just throughout this finger. Associated with swelling. Pain was about 8/10 level for several minutes then started to improve. Sought care - radiographs negative for a fracture. Right handed. Has been using a U shaped splint but feels worse in this. Has improved but not completely so.  Past Medical History  Diagnosis Date  . Allergy   . Stroke   . Sleep apnea   . Headache   . Arthritis   . GERD (gastroesophageal reflux disease)   . Hypertension   . Hyperlipidemia   . Hearing loss   . Nasal congestion   . Trouble swallowing   . Blood in stool   . Chest pain   . Leg swelling   . Constipation   . Rectal pain   . Difficulty urinating   . Thrombosed hemorrhoids   . Brain cyst   . PONV (postoperative nausea and vomiting)   . Diabetes mellitus without complication     Current Outpatient Prescriptions on File Prior to Visit  Medication Sig Dispense Refill  . allopurinol (ZYLOPRIM) 100 MG tablet TAKE 1 TABLET BY MOUTH DAILY.  90 tablet  1  . atorvastatin (LIPITOR) 10 MG tablet Take 1 tablet (10 mg total) by mouth daily.  90 tablet  0  . azelastine (ASTELIN) 137 MCG/SPRAY nasal spray PLACE 2 SPRAYS INTO THE NOSE TWICE A DAY. PUT IN EACH NOSTRIL AS DIRECTED  30 mL  1  . Beclomethasone Dipropionate (QNASL) 80 MCG/ACT AERS Place 2 sprays into the nose daily.      . Blood Glucose Monitoring Suppl (ONETOUCH VERIO IQ SYSTEM) W/DEVICE KIT by Does not apply route.      Jennette Banker Sodium 30-100 MG CAPS Take by mouth.      . diazepam (VALIUM) 5 MG tablet TAKE 1 TABLET BY MOUTH 3 TIMES A DAY  90 tablet  1  . glucose blood test strip Use as instructed  100  each  12  . HYDROcodone-acetaminophen (HYCET) 7.5-325 mg/15 ml solution Take 15 mLs by mouth every 6 (six) hours as needed for pain.  120 mL  0  . HYDROcodone-homatropine (HYCODAN) 5-1.5 MG/5ML syrup TAKE 1 TEASPOONFUL BY MOUTH EVERY 8 HOURS AS NEEDED FOR COUGH  120 mL  0  . hydrocortisone (ANUSOL-HC) 25 MG suppository Place 1 suppository (25 mg total) rectally 2 (two) times daily.  12 suppository  0  . hydrocortisone-pramoxine (ANALPRAM-HC) 2.5-1 % rectal cream Place 1 application rectally 2 (two) times daily as needed. For hemorroids.  30 g  2  . hydrOXYzine (ATARAX/VISTARIL) 25 MG tablet TAKE 1 TABLET AT BEDTIME  30 tablet  6  . ibuprofen (ADVIL,MOTRIN) 100 MG/5ML suspension Take 200 mg by mouth every 4 (four) hours as needed for fever.      . meclizine (ANTIVERT) 25 MG tablet Take 1 tablet (25 mg total) by mouth 3 (three) times daily as needed.  60 tablet  0  . metoprolol tartrate (LOPRESSOR) 25 MG tablet Take 1 tablet (25 mg total) by mouth 2 (two) times daily.  60 tablet  2  . Multiple Vitamin (MULTIVITAMIN) tablet Take 1 tablet by  mouth daily. GummyMultivitamin      . ONETOUCH DELICA LANCETS FINE MISC 1 each by Other route 4 (four) times daily. Dx. 250.00  100 each  12  . Phenylephrine-APAP-Guaifenesin (MUCINEX FAST-MAX COLD & SINUS) 5-325-200 MG TABS Take by mouth.      . polycarbophil (FIBERCON) 625 MG tablet Take 625 mg by mouth daily.      . promethazine (PHENERGAN) 25 MG suppository Place 1 suppository (25 mg total) rectally 2 (two) times daily as needed for nausea.  30 each  0  . ranitidine (ZANTAC) 150 MG/10ML syrup Take 150 mg by mouth 2 (two) times daily as needed. For indigestion.      Marland Kitchen venlafaxine XR (EFFEXOR-XR) 37.5 MG 24 hr capsule Take 1 capsule (37.5 mg total) by mouth daily.  90 capsule  0   No current facility-administered medications on file prior to visit.    Past Surgical History  Procedure Laterality Date  . Cataract extraction      x 3  . Inner ear surgery       rt ear  . Eye surgery  1968, 1985, 1987    cataracts  . Inner ear surgery  1992  . Surgery - right arm      1983    Allergies  Allergen Reactions  . Amoxicillin Hives, Itching and Other (See Comments)    White tongue; ? hives  . Terfenadine     ? reaction    History   Social History  . Marital Status: Single    Spouse Name: N/A    Number of Children: N/A  . Years of Education: N/A   Occupational History  . Not on file.   Social History Main Topics  . Smoking status: Never Smoker   . Smokeless tobacco: Never Used  . Alcohol Use: No     Comment: none  . Drug Use: No  . Sexually Active: No   Other Topics Concern  . Not on file   Social History Narrative  . No narrative on file    Family History  Problem Relation Age of Onset  . Cancer Mother 37    breast  . Stroke Father   . Heart disease Father   . Hypertension Father   . Heart attack Father   . Hypertension Brother   . Hyperlipidemia Neg Hx   . Diabetes Neg Hx     BP 150/92  Pulse 77  Ht 5\' 10"  (1.778 m)  Wt 188 lb (85.276 kg)  BMI 26.98 kg/m2  Review of Systems See HPI above.    Objective:   Physical Exam Gen: NAD  R hand: Mild swelling throughout index finger.  No bruising, other deformity. TTP mainly throughout volar index finger.  Some tenderness dorsally but nonfocal.  No collateral tenderness. Able to flex and extend against resistance at MCP, PIP, and DIP joints.  Pain with flexion at DIP but 5/5 strength.  FROM. Collateral ligaments intact. NVI distally.    Assessment & Plan:  1. Right index finger injury - consistent with flexor tendon strain or partial tear.  With full strength, full range of motion he should do well with conservative care.  No evidence extensor tendon injuries (mallet, central slip) or collateral ligament injuries.  Radiographs were negative.  Shown how to buddy tape as needed.  Has Raynouds so avoid icing.  Start occupational therapy - this is most important part  of his treatment.  NSAIDs as needed.  F/u in 4-6 weeks for reevaluation.

## 2012-10-12 NOTE — Assessment & Plan Note (Signed)
consistent with flexor tendon strain or partial tear.  With full strength, full range of motion he should do well with conservative care.  No evidence extensor tendon injuries (mallet, central slip) or collateral ligament injuries.  Radiographs were negative.  Shown how to buddy tape as needed.  Has Raynouds so avoid icing.  Start occupational therapy - this is most important part of his treatment.  NSAIDs as needed.  F/u in 4-6 weeks for reevaluation.

## 2012-10-13 ENCOUNTER — Ambulatory Visit: Payer: BC Managed Care – PPO | Attending: Family Medicine | Admitting: *Deleted

## 2012-10-13 DIAGNOSIS — M25549 Pain in joints of unspecified hand: Secondary | ICD-10-CM | POA: Insufficient documentation

## 2012-10-13 DIAGNOSIS — M25649 Stiffness of unspecified hand, not elsewhere classified: Secondary | ICD-10-CM | POA: Insufficient documentation

## 2012-10-13 DIAGNOSIS — IMO0001 Reserved for inherently not codable concepts without codable children: Secondary | ICD-10-CM | POA: Insufficient documentation

## 2012-10-13 DIAGNOSIS — M6281 Muscle weakness (generalized): Secondary | ICD-10-CM | POA: Insufficient documentation

## 2012-10-14 ENCOUNTER — Ambulatory Visit: Payer: BC Managed Care – PPO | Attending: Family Medicine | Admitting: Occupational Therapy

## 2012-10-14 ENCOUNTER — Telehealth: Payer: Self-pay | Admitting: Neurology

## 2012-10-14 DIAGNOSIS — M25549 Pain in joints of unspecified hand: Secondary | ICD-10-CM | POA: Insufficient documentation

## 2012-10-14 DIAGNOSIS — M6281 Muscle weakness (generalized): Secondary | ICD-10-CM | POA: Insufficient documentation

## 2012-10-14 DIAGNOSIS — IMO0001 Reserved for inherently not codable concepts without codable children: Secondary | ICD-10-CM | POA: Insufficient documentation

## 2012-10-14 DIAGNOSIS — M25649 Stiffness of unspecified hand, not elsewhere classified: Secondary | ICD-10-CM | POA: Insufficient documentation

## 2012-10-14 NOTE — Telephone Encounter (Signed)
Patient calling for MRI results.

## 2012-10-15 NOTE — Telephone Encounter (Signed)
Called and spoke with the patient. I let him know that Dr. Smiley Houseman was out of the office until Monday, May 5th. 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 He asked me to let Dr. Smiley Houseman know that  He is having trouble focusing and staying awake when he is at church, working at his computer or watching TV. He states he went on a job interview and almost fell asleep while waiting for his appointment. He says he doesn't get that way when driving as he is always doing something and is forced to pay attention. He is wondering if Dr. Smiley Houseman has an explanation for this. I let him know I would send him a message and let him know Dr. Hyacinth Meeker thoughts when available. He is fine with this plan. He does have a f/u appointment with Dr. Smiley Houseman in June. **Dr. Smiley Houseman, please advise. Thanks.

## 2012-10-18 NOTE — Telephone Encounter (Signed)
There are lots of possible explanations, including that he needs more sleep and his body is telling him so.  Also,  The pineal gland is involved in melatonin processing and that can influence sleep cycles.  If the problme continues in spite of plenty of quality sleep, then medication for wakefulness can be tried.

## 2012-10-19 ENCOUNTER — Ambulatory Visit: Payer: BC Managed Care – PPO | Admitting: Occupational Therapy

## 2012-10-19 NOTE — Telephone Encounter (Signed)
Left a message for the patient to return my call.  

## 2012-10-19 NOTE — Telephone Encounter (Signed)
Picked up a call from the patient. Information given as per Dr. Smiley Houseman below. The patient states he has been suffering with this problem for quite some time and is interested in trying a medication for wakefulness as mentioned by Dr. Smiley Houseman. He is asking for a generic if available and also to let Dr. Smiley Houseman know that he has trouble swallowing large pills and may either need to crush, chew and/or sprinkle the pill contents on food. [facility] is [facility] [address] I let him know that he would be in the office tomorrow and I would let him know something as soon as I heard back from him. He is ok to wait. **Dr. Smiley Houseman, please advise.

## 2012-10-20 ENCOUNTER — Telehealth: Payer: Self-pay | Admitting: Neurology

## 2012-10-20 ENCOUNTER — Telehealth: Payer: Self-pay | Admitting: Family Medicine

## 2012-10-20 NOTE — Telephone Encounter (Signed)
LM for pt to call, re: he is scheduled appt w/ Dr. Smiley Houseman for 11/17/12. Dr. Smiley Houseman will not be in the office this date, we need to r/s appt. Currently awaiting return call / Sherri S.

## 2012-10-20 NOTE — Telephone Encounter (Signed)
PT was calling to see if we still have a meter called  FREE STYLE INSULINX available for him to take home? thanks

## 2012-10-20 NOTE — Telephone Encounter (Signed)
I would like him to come in for a quick f/u and we can print the rx out.  It is C-3 controlled

## 2012-10-20 NOTE — Telephone Encounter (Signed)
Called and spoke with the patient. Scheduled f/u appointment for the patient 10/25/12 at 11:30. No other concerns at this time.

## 2012-10-21 ENCOUNTER — Encounter: Payer: BC Managed Care – PPO | Admitting: *Deleted

## 2012-10-21 ENCOUNTER — Telehealth: Payer: Self-pay | Admitting: Family Medicine

## 2012-10-21 ENCOUNTER — Encounter: Payer: BC Managed Care – PPO | Admitting: Occupational Therapy

## 2012-10-21 MED ORDER — METFORMIN HCL ER 500 MG PO TB24: 500.0000 mg | ORAL_TABLET | Freq: Every day | ORAL | Status: DC

## 2012-10-21 MED ORDER — FREESTYLE LANCETS MISC: Status: DC

## 2012-10-21 MED ORDER — FREESTYLE INSULINX SYSTEM W/DEVICE KIT: 1.0000 | PACK | Freq: Every day | Status: DC

## 2012-10-21 MED ORDER — GLUCOSE BLOOD VI STRP: ORAL_STRIP | Status: DC

## 2012-10-21 NOTE — Telephone Encounter (Signed)
Patient Information:  Caller Name: Trey Paula  Phone: [phone]  Patient: Chase [name], Chase [name]  Gender: Male  DOB: [birth year]  Age: 52 Years  PCP: Lelon Perla.  Office Follow Up:  Does the office need to follow up with this patient?: Yes  Instructions For The Office: meter and strips and lancets please krs/can  RN Note:  Patient noted BS 206 when he retired to bed at [facility] 10/21/12.  States his BS was 111 on arising AM 10/21/12.  Denies vomiting.  Afebrile.  Per diabetes protocol, emergent symptoms denied; advised for home care, with callback parameters given.  To keep appt 10/22/12 1345 with Dr. Laury Axon.  Also wants Rx for Freestyle InsulinX meter, lancets and strips [facility]  krs/can  Symptoms  Reason For Call & Symptoms: BS 206 0030 10/21/12.  Reviewed Health History In EMR: Yes  Reviewed Medications In EMR: Yes  Reviewed Allergies In EMR: Yes  Reviewed Surgeries / Procedures: Yes  Date of Onset of Symptoms: 10/21/2012  Guideline(s) Used:  Diabetes - High Blood Sugar  Disposition Per Guideline:   Home Care  Reason For Disposition Reached:   Blood glucose 60-240 mg/dl (3.5 -13 mmol/l)  Advice Given:  General  Definition of hyperglycemia: - Fasting blood glucose more than 140 mg/dL (7.5 mmol/l) or random blood glucose more than 200 mg/dL (11 mmol/l).  Symptoms of mild hyperglycemia: frequent urination, increased thirst, fatigue, blurred vision.  Symptoms of severe hyperglycemia: weakness, progressing to confusion and coma.  Treatment - Liquids  Drink at least one glass (8 oz or 240 ml) of water per hour for the next 4 hours. (Reason: adequate hydration will reduce hyperglycemia).  Generally, you should try to drink 6-8 glasses of water each day.  Measure and Record Your Blood Glucose  Every day you should measure your blood glucose before breakfast and before going to bed.  Record the results and show them to your doctor at your next office visit.  Daily Blood  Glucose Goals  Pre-prandial (before meal): 70-130 mg/dL (0.9-8.1 mmol/l)  Patient Will Follow Care Advice:  YES

## 2012-10-21 NOTE — Telephone Encounter (Signed)
Start Metformin xr 500 mg #30  1 po qpm 2 refills

## 2012-10-21 NOTE — Telephone Encounter (Signed)
Caller: Chase [name]/Patient; Phone: [phone]; Reason for Call: Patient called on 10/20/12 requesting prescription for new glucometer, strips and lancets.  Prescription was sent to the pharmacy for lancets and strips, but not the Freestyle Insulin X glucometer.  Patient was told that the office had one of these meters at one point, and he is wondering if he could still get that one.  If not, can a prescription be called into the pharmacy for the patient for the Freestyle Insulin X meter?  Please contact patient regarding this request.  Thanks.

## 2012-10-21 NOTE — Telephone Encounter (Signed)
See other phone message  

## 2012-10-21 NOTE — Telephone Encounter (Signed)
Chase [name] at 10/21/2012 3:16 PM   Status: Signed            Caller: [name]/Patient; Phone: [phone]; Reason for Call: Patient called on 10/20/12 requesting prescription for new glucometer, strips and lancets. Prescription was sent to the pharmacy for lancets and strips, but not the Freestyle Insulin X glucometer. Patient was told that the office had one of these meters at one point, and he is wondering if he could still get that one. If not, can a prescription be called into the pharmacy for the patient for the Freestyle Insulin X meter? Please contact patient regarding this request. Thanks.

## 2012-10-21 NOTE — Telephone Encounter (Signed)
Spoke with patient and voiced understanding. Rx faxed     KP

## 2012-10-21 NOTE — Telephone Encounter (Signed)
Rx sent 

## 2012-10-22 ENCOUNTER — Encounter: Payer: Self-pay | Admitting: Family Medicine

## 2012-10-22 ENCOUNTER — Ambulatory Visit (INDEPENDENT_AMBULATORY_CARE_PROVIDER_SITE_OTHER): Payer: BC Managed Care – PPO | Admitting: Family Medicine

## 2012-10-22 ENCOUNTER — Ambulatory Visit: Payer: BC Managed Care – PPO | Admitting: *Deleted

## 2012-10-22 VITALS — BP 144/90 | HR 87 | Temp 98.4°F | Wt 189.6 lb

## 2012-10-22 DIAGNOSIS — J309 Allergic rhinitis, unspecified: Secondary | ICD-10-CM

## 2012-10-22 DIAGNOSIS — J019 Acute sinusitis, unspecified: Secondary | ICD-10-CM

## 2012-10-22 DIAGNOSIS — M79609 Pain in unspecified limb: Secondary | ICD-10-CM

## 2012-10-22 DIAGNOSIS — E1165 Type 2 diabetes mellitus with hyperglycemia: Secondary | ICD-10-CM

## 2012-10-22 DIAGNOSIS — E118 Type 2 diabetes mellitus with unspecified complications: Secondary | ICD-10-CM

## 2012-10-22 DIAGNOSIS — J302 Other seasonal allergic rhinitis: Secondary | ICD-10-CM

## 2012-10-22 DIAGNOSIS — M79644 Pain in right finger(s): Secondary | ICD-10-CM

## 2012-10-22 MED ORDER — METFORMIN HCL ER 500 MG PO TB24: ORAL_TABLET | ORAL | Status: DC

## 2012-10-22 MED ORDER — PREDNISONE 10 MG PO TABS: ORAL_TABLET | ORAL | Status: DC

## 2012-10-22 MED ORDER — HYDROCODONE-ACETAMINOPHEN 7.5-325 MG/15ML PO SOLN: 15.0000 mL | Freq: Four times a day (QID) | ORAL | Status: DC | PRN

## 2012-10-22 MED ORDER — METFORMIN HCL 500 MG/5ML PO SOLN: ORAL | Status: DC

## 2012-10-22 MED ORDER — CETIRIZINE HCL 10 MG PO TABS: 10.0000 mg | ORAL_TABLET | Freq: Every day | ORAL | Status: DC

## 2012-10-22 NOTE — Patient Instructions (Addendum)

## 2012-10-22 NOTE — Progress Notes (Signed)
  Subjective:     Chase [name] is a 52 y.o. male who presents for evaluation of sinus pain. Symptoms include: congestion, cough, nasal congestion and sinus pressure. Onset of symptoms was 2 weeks ago. Symptoms have been gradually worsening since that time. Past history is significant for no history of pneumonia or bronchitis. Patient is a non-smoker.  The following portions of the patient's history were reviewed and updated as appropriate: allergies, current medications, past family history, past medical history, past social history, past surgical history and problem list.  Review of Systems Pertinent items are noted in HPI.   Objective:    BP 144/90  Pulse 87  Temp(Src) 98.4 F (36.9 C) (Oral)  Wt 189 lb 9.6 oz (86.002 kg)  BMI 27.2 kg/m2  SpO2 97% General appearance: alert, cooperative, appears stated age and no distress Ears: normal TM's and external ear canals both ears Nose: yellow discharge, moderate congestion, turbinates red, swollen, sinus tenderness bilateral Throat: lips, mucosa, and tongue normal; teeth and gums normal Neck: no adenopathy, supple, symmetrical, trachea midline and thyroid not enlarged, symmetric, no tenderness/mass/nodules Lungs: clear to auscultation bilaterally Heart: S1, S2 normal    Assessment:    allergic sinusitis.    Plan:     antihistamine   Nasal spray rto prn

## 2012-10-24 ENCOUNTER — Encounter: Payer: Self-pay | Admitting: Family Medicine

## 2012-10-25 ENCOUNTER — Encounter: Payer: Self-pay | Admitting: Neurology

## 2012-10-25 ENCOUNTER — Ambulatory Visit: Payer: BC Managed Care – PPO | Admitting: Neurology

## 2012-10-25 VITALS — BP 126/70 | HR 84 | Temp 97.8°F | Resp 12 | Ht 70.0 in | Wt 188.0 lb

## 2012-10-25 DIAGNOSIS — E348 Other specified endocrine disorders: Secondary | ICD-10-CM

## 2012-10-25 DIAGNOSIS — G4733 Obstructive sleep apnea (adult) (pediatric): Secondary | ICD-10-CM

## 2012-10-25 MED ORDER — ARMODAFINIL 150 MG PO TABS
150.0000 mg | ORAL_TABLET | Freq: Every day | ORAL | Status: DC
Start: 2012-10-25 — End: 2013-06-01

## 2012-10-25 NOTE — Patient Instructions (Addendum)
I will refer you to [facility] at [facility] located at [address]  They will contact you about an appointment.   [phone]  Follow up here in 6 months if needed.

## 2012-10-25 NOTE — Progress Notes (Unsigned)
Chase [name] returns for followup of his MRI results.  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  He has been holding his Lipitor and he feels his muscle pain is slightly better and that his memory is slightly better.  He is complaining of excessive daytime sleepiness with an upper score of 13 or 14/24.  He does have sleep apnea and was diagnosed several years ago based on snoring and observed breath holding episodes. He tried CPAP but has been unable to get well adjusted after trying multiple masks.  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  However, he is trying to get restarted on CPAP and he is going to advanced puncture tomorrow to get a new mask to try with his CPAP machine, which is currently set at 7 cm.  He starts out sleeping on his side but usually ends up on his back and then it is so loud that his girlfriend can hear him couple doors down with the doors closed.  Review of systems is positive for some degree of dry mouth in the morning as well as daytime sleepiness and occasional headaches as mentioned in his previous office visit.  He also has memory loss and muscle pains which may be slightly better since holding the Lipitor.  Past Medical History  Diagnosis Date  . Allergy   . Stroke   . Sleep apnea   . Headache   . Arthritis   . GERD (gastroesophageal reflux disease)   . Hypertension   . Hyperlipidemia   . Hearing loss   . Nasal congestion   . Trouble swallowing   . Blood in stool   . Chest pain   . Leg swelling   . Constipation   . Rectal pain   . Difficulty urinating   . Thrombosed hemorrhoids   . Brain cyst   . PONV (postoperative nausea and vomiting)   . Diabetes mellitus without complication     Current Outpatient Prescriptions on File Prior to Visit  Medication Sig Dispense Refill  . allopurinol (ZYLOPRIM) 100 MG tablet TAKE 1 TABLET BY MOUTH DAILY.  90 tablet  1  . azelastine (ASTELIN)  137 MCG/SPRAY nasal spray PLACE 2 SPRAYS INTO THE NOSE TWICE A DAY. PUT IN EACH NOSTRIL AS DIRECTED  30 mL  1  . Beclomethasone Dipropionate (QNASL) 80 MCG/ACT AERS Place 2 sprays into the nose daily.      . Blood Glucose Monitoring Suppl (FREESTYLE INSULINX SYSTEM) W/DEVICE KIT 1 Device by Does not apply route daily.  1 kit  0  . Blood Glucose Monitoring Suppl (ONETOUCH VERIO IQ SYSTEM) W/DEVICE KIT by Does not apply route.      Jennette Banker Sodium 30-100 MG CAPS Take by mouth.      . cetirizine (ZYRTEC) 10 MG tablet Take 1 tablet (10 mg total) by mouth daily.  30 tablet  11  . diazepam (VALIUM) 5 MG tablet TAKE 1 TABLET BY MOUTH 3 TIMES A DAY  90 tablet  1  . glucose blood (FREESTYLE INSULINX TEST) test strip Use as instructed test 4 (four) times daily. Dx. 250.00  100 each  1  . glucose blood test strip Use as instructed  100 each  12  . HYDROcodone-acetaminophen (HYCET) 7.5-325 mg/15 ml solution Take 15 mLs by mouth every 6 (six) hours as needed for pain.  120 mL  0  . HYDROcodone-homatropine (HYCODAN) 5-1.5 MG/5ML syrup  TAKE 1 TEASPOONFUL BY MOUTH EVERY 8 HOURS AS NEEDED FOR COUGH  120 mL  0  . hydrocortisone (ANUSOL-HC) 25 MG suppository Place 1 suppository (25 mg total) rectally 2 (two) times daily.  12 suppository  0  . hydrocortisone-pramoxine (ANALPRAM-HC) 2.5-1 % rectal cream Place 1 application rectally 2 (two) times daily as needed. For hemorroids.  30 g  2  . hydrOXYzine (ATARAX/VISTARIL) 25 MG tablet TAKE 1 TABLET AT BEDTIME  30 tablet  6  . ibuprofen (ADVIL,MOTRIN) 100 MG/5ML suspension Take 200 mg by mouth every 4 (four) hours as needed for fever.      . Lancets (FREESTYLE) lancets Use as instructed test 4 (four) times daily. Dx. 250.00  100 each  1  . meclizine (ANTIVERT) 25 MG tablet Take 1 tablet (25 mg total) by mouth 3 (three) times daily as needed.  60 tablet  0  . metoprolol tartrate (LOPRESSOR) 25 MG tablet Take 1 tablet (25 mg total) by mouth 2 (two) times daily.   60 tablet  2  . Multiple Vitamin (MULTIVITAMIN) tablet Take 1 tablet by mouth daily. GummyMultivitamin      . predniSONE (DELTASONE) 10 MG tablet 3 po qd for 3 days then 2 po qd for 3 days the 1 po qd for 3 days  18 tablet  0  . venlafaxine XR (EFFEXOR-XR) 37.5 MG 24 hr capsule Take 1 capsule (37.5 mg total) by mouth daily.  90 capsule  0  . atorvastatin (LIPITOR) 10 MG tablet Take 1 tablet (10 mg total) by mouth daily.  90 tablet  0  . Metformin HCl 500 MG/5ML SOLN 1 tsp po bid  300 mL  2  . promethazine (PHENERGAN) 25 MG suppository Place 1 suppository (25 mg total) rectally 2 (two) times daily as needed for nausea.  30 each  0  . ranitidine (ZANTAC) 150 MG/10ML syrup Take 150 mg by mouth 2 (two) times daily as needed. For indigestion.       No current facility-administered medications on file prior to visit.   Amoxicillin and Terfenadine  History   Social History  . Marital Status: Single    Spouse Name: N/A    Number of Children: N/A  . Years of Education: N/A   Occupational History  . Not on file.   Social History Main Topics  . Smoking status: Never Smoker   . Smokeless tobacco: Never Used  . Alcohol Use: No     Comment: none  . Drug Use: No  . Sexually Active: No   Other Topics Concern  . Not on file   Social History Narrative  . No narrative on file    Family History  Problem Relation Age of Onset  . Cancer Mother 75    breast  . Stroke Father   . Heart disease Father   . Hypertension Father   . Heart attack Father   . Hypertension Brother   . Hyperlipidemia Neg Hx   . Diabetes Neg Hx     BP 126/70  Pulse 84  Temp(Src) 97.8 F (36.6 C)  Resp 12  Ht 5\' 10"  (1.778 m)  Wt 188 lb (85.276 kg)  BMI 26.98 kg/m2   Alert and oriented x 3.  Memory function appears to be intact.  Concentration and attention are normal for educational level and background.  Speech is fluent and without significant word finding difficulty.  Is aware of current events.  No  carotid bruits detected.  Cranial nerve II through XII  are within normal limits.  This includes normal optic discs and acuity, EOMI, PERLA, facial movement and sensation intact, hearing grossly intact, gag intact,Uvula raises symmetrically and tongue protrudes evenly. Motor strength is 5 over 5 throughout all limbs.  No atrophy, abnormal tone or tremors. Reflexes are 2+ and symmetric in the upper and lower extremities Sensory exam is intact. Coordination is intact for fine movements and rapid alternating movements in all limbs Gait and station are normal.   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 2. Memory loss, possibly slightly improved since holding Lipitor. 3. Obstructive sleep apnea but has had trouble getting compliant with CPAP and also has TMJ syndrome [place] good candidate for oral appliance.  He is having limited success with positional therapy.  He is expressing excessive daytime sleepiness which I presume is new to the sleep apnea.  Plan: 1. He will get his new CPAP mask tomorrow and I recommended he try the swift fracture nasal pillows by rest med. 2. He'll be referred to [facility] sleep clinic, for followup and compliance checks, and if need be retesting to adjust his CPAP. 3. If still excessively sleepy after being on CPAP for one week, he can begin nuvigil 150 once a day on a trial basis.  He is prescribed #30 with one refill 4. He can return here in 6 months for the pineal cyst and memory issues.

## 2012-10-26 ENCOUNTER — Telehealth: Payer: Self-pay | Admitting: *Deleted

## 2012-10-26 ENCOUNTER — Ambulatory Visit (INDEPENDENT_AMBULATORY_CARE_PROVIDER_SITE_OTHER): Payer: BC Managed Care – PPO | Admitting: Internal Medicine

## 2012-10-26 ENCOUNTER — Encounter: Payer: Self-pay | Admitting: Internal Medicine

## 2012-10-26 ENCOUNTER — Ambulatory Visit: Payer: BC Managed Care – PPO | Admitting: Occupational Therapy

## 2012-10-26 VITALS — BP 138/88 | HR 86 | Temp 98.4°F | Resp 10 | Ht 70.0 in | Wt 188.0 lb

## 2012-10-26 DIAGNOSIS — E1165 Type 2 diabetes mellitus with hyperglycemia: Secondary | ICD-10-CM

## 2012-10-26 DIAGNOSIS — E118 Type 2 diabetes mellitus with unspecified complications: Secondary | ICD-10-CM

## 2012-10-26 NOTE — Telephone Encounter (Signed)
ASKED PT TO CALL us TO SCHEDULE SLEEP CONSULT APPT WITH ONE OF OUR SLEEP MEDICINE PHYSICIANS - PT NEEDS TO BRING HIS CPAP TO APPOINTMENT. -SH

## 2012-10-26 NOTE — Patient Instructions (Addendum)
Please return in 3 months with your sugar log. Please start Metformin 500 mg twice a day. Take it initially just with dinner for 5 days, then 500 mg with breakfast, too.  Check your sugars ~ 2x a day, rotating check times.  PATIENT INSTRUCTIONS FOR TYPE 2 DIABETES:  **Please join MyChart!** - see attached instructions about how to join   DIET AND EXERCISE Diet and exercise is an important part of diabetic treatment.  We recommended aerobic exercise in the form of brisk walking (working between 40-60% of maximal aerobic capacity, similar to brisk walking) for 150 minutes per week (such as 30 minutes five days per week) along with 3 times per week performing 'resistance' training (using various gauge rubber tubes with handles) 5-10 exercises involving the major muscle groups (upper body, lower body and core) performing 10-15 repetitions (or near fatigue) each exercise. Start at half the above goal but build slowly to reach the above goals. If limited by weight, joint pain, or disability, we recommend daily walking in a swimming pool with water up to waist to reduce pressure from joints while allow for adequate exercise.    BLOOD GLUCOSES Monitoring your blood glucoses is important for continued management of your diabetes. Please check your blood glucoses 2-4 times a day: fasting, before meals and at bedtime (you can rotate these measurements - e.g. one day check before the 3 meals, the next day check before 2 of the meals and before bedtime, etc.   HYPOGLYCEMIA (low blood sugar) Hypoglycemia is usually a reaction to not eating, exercising, or taking too much insulin/ other diabetes drugs.  Symptoms include tremors, sweating, hunger, confusion, headache, etc. Treat IMMEDIATELY with 15 grams of Carbs:   4 glucose tablets    cup regular juice/soda   2 tablespoons raisins   4 teaspoons sugar   1 tablespoon honey Recheck blood glucose in 15 mins and repeat above if still symptomatic/blood glucose  <100. Please contact our office at [phone] if you have questions about how to next handle your insulin.  RECOMMENDATIONS TO REDUCE YOUR RISK OF DIABETIC COMPLICATIONS: * Take your prescribed MEDICATION(S). * Follow a DIABETIC diet: Complex carbs, fiber rich foods, heart healthy fish twice weekly, (monounsaturated and polyunsaturated) fats * AVOID saturated/trans fats, high fat foods, >2,300 mg salt per day. * EXERCISE at least 5 times a week for 30 minutes or preferably daily.  * DO NOT SMOKE OR DRINK more than 1 drink a day. * Check your FEET every day. Do not wear tightfitting shoes. Contact us if you develop an ulcer * See your EYE doctor once a year or more if needed * Get a FLU shot once a year * Get a PNEUMONIA vaccine once before and once after age 42 years  GOALS:  * Your Hemoglobin A1c of <7%  * Your Systolic BP should be 140 or lower  * Your Diastolic BP should be 80 or lower  * Your HDL (Good Cholesterol) should be 40 or higher  * Your LDL (Bad Cholesterol) should be 100 or lower  * Your Triglycerides should be 150 or lower  * Your Urine microalbumin (kidney function) should be <30 * Your Body Mass Index should be 25 or lower   We will be glad to help you achieve these goals. Our telephone number is: [phone].

## 2012-10-26 NOTE — Progress Notes (Signed)
Patient ID: SAATHVIK EVERY, male   DOB: [birth year], 52 y.o.   MRN: [id number]  HPI: Chase [name] is a 52 y.o.-year-old male, self-referred for management of DM2, newly diagnosed, non-insulin-dependent, controlled, with complications (peripheral neuropathy). PCP: Dr. Laury Axon.  Patient has been diagnosed with diabetes last month; he has not been on insulin before. He is very confused about the new diagnosis, and is not sure when to check sugars and exactly what to do about these. Last hemoglobin A1c was: Lab Results  Component Value Date   HGBA1C 6.7* 09/17/2012  Previous HbA1C: 6.4%.  PCP referred him to Db education and he already saw nutrition Claris Che May).   Pt should be on a regimen of: - Metformin 500 mg po bid - in liquid form, as he cannot swallow pills He mentions that he did not start metformin, if he read about the side effect of lactic acidosis online.  Pt checks his sugars 4x a day and they are: 90-140, very few measurements at 160 or 180, mostly after dinner  He has 3 meters now. Uses more the FreeStyle Insulinx meter . He would like to check his meters and compare to our CBG measurement here in the clinic  No lows. Lowest sugar was 90; he has hypoglycemia awareness at 70. Highest sugar was 206.  Pt's meals are: - Breakfast: cereals - Lunch: nothing or homemade burger + baked beans, sandwich, fish, subway 6" sub - Dinner: pizza, frozen dinner, subway 6" sub - Snacks: 3-5  Pt has mild chronic kidney disease, last BUN/creatinine was:  Lab Results  Component Value Date   BUN 16 09/17/2012   CREATININE 1.1 09/17/2012  GFR 73.  Last set of lipids: Lab Results  Component Value Date   CHOL 140 09/17/2012   HDL 32.10* 09/17/2012   LDLCALC 84 09/17/2012   LDLDIRECT 142.4 11/26/2011   TRIG 121.0 09/17/2012   CHOLHDL 4 09/17/2012   Pt's last eye exam was in 2012. No DR. Has numbness and tingling in his legs for ~ last year.   PMH: I reviewed his chart and he also has a history of  hypertension, hyperlipidemia, GERD, history of 2 minor strokes, obstructive sleep apnea,  Pineal cyst - followed by neurology, stable on a recent MRI compared to an MRI 2 years ago. He has anxiety, PTSD from childhood. Has varicose veins >> wears compresion hoses.  No FH of DM.  ROS: Constitutional: fatigue, increased appetite, weight gain, feeling excessively hot, hot flashes, poor sleep Eyes: no blurry vision, no xerophthalmia ENT: has sore throat, no nodules palpated in throat, has difficulty swallowing, no hoarseness; decreased hearing, ringing in his ears Cardiovascular: +CP - positional, when stooped position (history of costochondritis)/+ SOB/ + palpitations/+ leg swelling Respiratory: +cough/+ SOB Gastrointestinal: + all: N/V/D/C/gerd Musculoskeletal: + all: muscle/joint aches Skin: + rash, + itching, + excessive hair growth Neurological: no tremors/numbness/tingling/dizziness, + headaches Psychiatric: + depression/anxiety Has burning with urination, painful ejaculation  Past Medical History  Diagnosis Date  . Allergy   . Stroke   . Sleep apnea   . Headache   . Arthritis   . GERD (gastroesophageal reflux disease)   . Hypertension   . Hyperlipidemia   . Hearing loss   . Nasal congestion   . Trouble swallowing   . Blood in stool   . Chest pain   . Leg swelling   . Constipation   . Rectal pain   . Difficulty urinating   . Thrombosed hemorrhoids   .  Brain cyst   . PONV (postoperative nausea and vomiting)   . Diabetes mellitus without complication    Past Surgical History  Procedure Laterality Date  . Cataract extraction      x 3  . Inner ear surgery      rt ear  . Eye surgery  1968, 1985, 1987    cataracts  . Inner ear surgery  1992  . Surgery - right arm      1983   History   Social History  . Marital Status: Single    Number of Children: 0  Lives with a roommate.  Occupational History  . Not working.   Social History Main Topics  . Smoking status:  Never Smoker   . Smokeless tobacco: Never Used  . Alcohol Use: No     Comment: none  . Drug Use: No  . Sexually Active: No   Current Outpatient Prescriptions on File Prior to Visit  Medication Sig Dispense Refill  . allopurinol (ZYLOPRIM) 100 MG tablet TAKE 1 TABLET BY MOUTH DAILY.  90 tablet  1  . Armodafinil 150 MG tablet Take 1 tablet (150 mg total) by mouth daily.  30 tablet  1  . atorvastatin (LIPITOR) 10 MG tablet Take 1 tablet (10 mg total) by mouth daily.  90 tablet  0  . azelastine (ASTELIN) 137 MCG/SPRAY nasal spray PLACE 2 SPRAYS INTO THE NOSE TWICE A DAY. PUT IN EACH NOSTRIL AS DIRECTED  30 mL  1  . Beclomethasone Dipropionate (QNASL) 80 MCG/ACT AERS Place 2 sprays into the nose daily.      . Blood Glucose Monitoring Suppl (FREESTYLE INSULINX SYSTEM) W/DEVICE KIT 1 Device by Does not apply route daily.  1 kit  0  . Blood Glucose Monitoring Suppl (ONETOUCH VERIO IQ SYSTEM) W/DEVICE KIT by Does not apply route.      Jennette Banker Sodium 30-100 MG CAPS Take by mouth.      . cetirizine (ZYRTEC) 10 MG tablet Take 1 tablet (10 mg total) by mouth daily.  30 tablet  11  . diazepam (VALIUM) 5 MG tablet TAKE 1 TABLET BY MOUTH 3 TIMES A DAY  90 tablet  1  . glucose blood (FREESTYLE INSULINX TEST) test strip Use as instructed test 4 (four) times daily. Dx. 250.00  100 each  1  . glucose blood test strip Use as instructed  100 each  12  . HYDROcodone-acetaminophen (HYCET) 7.5-325 mg/15 ml solution Take 15 mLs by mouth every 6 (six) hours as needed for pain.  120 mL  0  . HYDROcodone-homatropine (HYCODAN) 5-1.5 MG/5ML syrup TAKE 1 TEASPOONFUL BY MOUTH EVERY 8 HOURS AS NEEDED FOR COUGH  120 mL  0  . hydrocortisone (ANUSOL-HC) 25 MG suppository Place 1 suppository (25 mg total) rectally 2 (two) times daily.  12 suppository  0  . hydrocortisone-pramoxine (ANALPRAM-HC) 2.5-1 % rectal cream Place 1 application rectally 2 (two) times daily as needed. For hemorroids.  30 g  2  . hydrOXYzine  (ATARAX/VISTARIL) 25 MG tablet TAKE 1 TABLET AT BEDTIME  30 tablet  6  . ibuprofen (ADVIL,MOTRIN) 100 MG/5ML suspension Take 200 mg by mouth every 4 (four) hours as needed for fever.      . Lancets (FREESTYLE) lancets Use as instructed test 4 (four) times daily. Dx. 250.00  100 each  1  . meclizine (ANTIVERT) 25 MG tablet Take 1 tablet (25 mg total) by mouth 3 (three) times daily as needed.  60 tablet  0  .  Metformin HCl 500 MG/5ML SOLN 1 tsp po bid  300 mL  2  . metoprolol tartrate (LOPRESSOR) 25 MG tablet Take 1 tablet (25 mg total) by mouth 2 (two) times daily.  60 tablet  2  . Multiple Vitamin (MULTIVITAMIN) tablet Take 1 tablet by mouth daily. GummyMultivitamin      . predniSONE (DELTASONE) 10 MG tablet 3 po qd for 3 days then 2 po qd for 3 days the 1 po qd for 3 days  18 tablet  0  . promethazine (PHENERGAN) 25 MG suppository Place 1 suppository (25 mg total) rectally 2 (two) times daily as needed for nausea.  30 each  0  . ranitidine (ZANTAC) 150 MG/10ML syrup Take 150 mg by mouth 2 (two) times daily as needed. For indigestion.      Marland Kitchen venlafaxine XR (EFFEXOR-XR) 37.5 MG 24 hr capsule Take 1 capsule (37.5 mg total) by mouth daily.  90 capsule  0   No current facility-administered medications on file prior to visit.   Allergies  Allergen Reactions  . Amoxicillin Hives, Itching and Other (See Comments)    White tongue; ? hives  . Terfenadine     ? reaction   Family History  Problem Relation Age of Onset  . Cancer Mother 57    breast  . Stroke Father   . Heart disease Father   . Hypertension Father   . Heart attack Father   . Hypertension Brother   . Hyperlipidemia Neg Hx   . Diabetes Neg Hx    PE: BP 138/88  Pulse 86  Temp(Src) 98.4 F (36.9 C) (Oral)  Resp 10  Ht 5\' 10"  (1.778 m)  Wt 188 lb (85.276 kg)  BMI 26.98 kg/m2  SpO2 97% Wt Readings from Last 3 Encounters:  10/25/12 188 lb (85.276 kg)  10/22/12 189 lb 9.6 oz (86.002 kg)  10/11/12 188 lb (85.276 kg)   Constitutional: slightly overweight, in NAD Eyes: R eye with dilated pupil, exotropia, and very decreased vision, no exophthalmos ENT: moist mucous membranes, no thyromegaly, no cervical lymphadenopathy Cardiovascular: RRR, No MRG Respiratory: CTA B Gastrointestinal: abdomen soft, NT, ND, BS+ Musculoskeletal: no deformities, strength intact in all 4 Skin: moist, warm, no rashes Neurological: no tremor with outstretched hands, DTR normal in all 4  ASSESSMENT: 1. DM2, non-insulin-dependent, controlled, with complications - peripheral neuropathy  PLAN:  1. Pt with newly dx-ed DM2, with a lot of anxiety and confusion about his new dx.  - we discussed about how he needs to check his sugars, about Metformin and SEs and the fact that this is the best medication we have for DM2 so far (other than insulin), discussed about how he needs to take it (with meals) and described the mechanism of action. He agrees to start it. - we did not discuss much about diet at this visit as he is having nutrition appts, will address at next visit - given sugar log and advised how to fill it and to bring it at next appt - check 2x a day (this is likely enough), rotating sites - given foot care handout and explained the principles - given instructions for hypoglycemia management "15-15 rule" - we checked a CBG with our meter: 115. With his meters: 101, 124, 117. Explained error margin of ~20% with each meter and between repeated measurements with the same meter. He can use any of his 3 meters.  - he had several questions which I tried to answer, and I encouraged him to  send me questions or concerns through MyChart even between appts - I will see him back in 3 months with his sugar log. - advised him to schedule a new appt with eye dr.

## 2012-10-27 ENCOUNTER — Encounter: Payer: Self-pay | Admitting: Internal Medicine

## 2012-11-02 ENCOUNTER — Encounter: Payer: Self-pay | Admitting: Vascular Surgery

## 2012-11-02 ENCOUNTER — Ambulatory Visit: Payer: BC Managed Care – PPO | Admitting: Occupational Therapy

## 2012-11-02 ENCOUNTER — Encounter: Payer: No Typology Code available for payment source | Attending: Family Medicine | Admitting: *Deleted

## 2012-11-02 VITALS — Ht 70.5 in | Wt 188.2 lb

## 2012-11-02 DIAGNOSIS — E119 Type 2 diabetes mellitus without complications: Secondary | ICD-10-CM | POA: Insufficient documentation

## 2012-11-02 DIAGNOSIS — E1165 Type 2 diabetes mellitus with hyperglycemia: Secondary | ICD-10-CM

## 2012-11-02 DIAGNOSIS — Z713 Dietary counseling and surveillance: Secondary | ICD-10-CM | POA: Insufficient documentation

## 2012-11-02 NOTE — Progress Notes (Signed)
  Medical Nutrition Therapy:  Appt start time: 1100 end time:  1130.   Assessment:  Primary concerns today: Here for follow up visit for Type 2 Diabetes. States he has started taking liquid form of Metformin as he has difficulty swallowing pills. He is SMBG and recording data on a spreadsheet with minute detail. Stated BG range is typically within normal target levels. He has seen 2 other providers in this center, the most recent is retiring so I am seeing him for the first time today.  BLOOD GLUCOSE MONITORING: He has 2 meters and voices concern that they do not match  MEDICATIONS: see list, diabetes medication is Riomet   DIETARY INTAKE: Did not discuss at this vist  Progress Towards Goal(s):  In progress.   Nutritional Diagnosis:  [place]-2.1 Inpaired nutrition utilization As related to glucose.  As evidenced by new diagnosis of diabetes type 2 with an A1C at 6.7% on 09/17/2012   Intervention:  Spent most of this visit reviewing physiology of diabetes and what target BG goals should be as he didn't seem to know those. Also reviewed rationale of SMBG and that since most of his BGs were within target ranges, he didn't have to check quite as often. Also discussed factors that affect his BG in terms of carbohydrate, activity, stress and insulin availability. Explained that meters are not laboratory machines and they can vary up to 20% routinely.  Plan:  Aim for 3-4 Carb Choices per meal (45-60 grams) +/- 1 either way  Aim for 0-2 Carbs per snack if hungry  Consider reading food labels for Total Carbohydrate of foods Consider  increasing your activity level by walking for 15-30 minutes daily as tolerated  Handouts given during visit include:  Living Well with Diabetes   Monitoring/Evaluation:  Dietary intake, exercise, blood glucose levels, and body weight in 4 weeks.

## 2012-11-02 NOTE — Patient Instructions (Addendum)
Plan:  Aim for 3-4 Carb Choices per meal (45-60 grams) +/- 1 either way  Aim for 0-2 Carbs per snack if hungry  Consider reading food labels for Total Carbohydrate of foods Consider  increasing your activity level by walking for 15-30 minutes daily as tolerated

## 2012-11-03 ENCOUNTER — Encounter: Payer: Self-pay | Admitting: Vascular Surgery

## 2012-11-03 ENCOUNTER — Encounter (INDEPENDENT_AMBULATORY_CARE_PROVIDER_SITE_OTHER): Payer: BC Managed Care – PPO | Admitting: Vascular Surgery

## 2012-11-03 ENCOUNTER — Ambulatory Visit (INDEPENDENT_AMBULATORY_CARE_PROVIDER_SITE_OTHER): Payer: BC Managed Care – PPO | Admitting: Vascular Surgery

## 2012-11-03 VITALS — BP 131/74 | HR 81 | Ht 70.5 in | Wt 188.0 lb

## 2012-11-03 DIAGNOSIS — I83893 Varicose veins of bilateral lower extremities with other complications: Secondary | ICD-10-CM

## 2012-11-03 DIAGNOSIS — M7989 Other specified soft tissue disorders: Secondary | ICD-10-CM

## 2012-11-03 DIAGNOSIS — M79609 Pain in unspecified limb: Secondary | ICD-10-CM

## 2012-11-03 NOTE — Progress Notes (Signed)
[facility] [place]  Patient name: Chase [name] MRN: [id number] DOB: April 11, 1961 Sex: male  REASON FOR CONSULT: varicose veins.  HPI: Chase [name] is a 52 y.o. male he states that he has had some varicose veins for many years. Recently these have become more symptomatic. He complains of aching pain and heaviness in both lower extremities. His legs also become tired. He states his symptoms are aggravated by sitting and standing. There are no real alleviating factors that he is aware of. He also experiences cramps at night. He has also had some itching and burning.  He is unaware of any history of previous DVT or phlebitis.  Past Medical History  Diagnosis Date  . Allergy   . Stroke   . Sleep apnea   . Headache   . Arthritis   . GERD (gastroesophageal reflux disease)   . Hypertension   . Hyperlipidemia   . Hearing loss   . Nasal congestion   . Trouble swallowing   . Blood in stool   . Chest pain   . Leg swelling   . Constipation   . Rectal pain   . Difficulty urinating   . Thrombosed hemorrhoids   . Brain cyst   . PONV (postoperative nausea and vomiting)   . Diabetes mellitus without complication     Family History  Problem Relation Age of Onset  . Cancer Mother 55    breast  . Stroke Father   . Heart disease Father   . Hypertension Father   . Heart attack Father   . Hypertension Brother   . Hyperlipidemia Neg Hx   . Diabetes Neg Hx    SOCIAL HISTORY: History  Substance Use Topics  . Smoking status: Never Smoker   . Smokeless tobacco: Never Used  . Alcohol Use: No     Comment: none    Allergies  Allergen Reactions  . Amoxicillin Hives, Itching and Other (See Comments)    White tongue; ? hives  . Terfenadine     ? reaction   PHYSICAL EXAM: Filed Vitals:   11/03/12 1001  BP: 131/74  Pulse: 81  Height: 5' 10.5" (1.791 m)  Weight: 188 lb (85.276 kg)  SpO2: 100%   Body mass index is 26.58 kg/(m^2). GENERAL: The patient is a  well-nourished male, in no acute distress. The vital signs are documented above. CARDIOVASCULAR: There is a regular rate and rhythm. I do not detect carotid bruits. He has palpable femoral pulses and dorsalis pedis pulses bilaterally. PULMONARY: There is good air exchange bilaterally without wheezing or rales. ABDOMEN: Soft and non-tender with normal pitched bowel sounds.  MUSCULOSKELETAL: There are no major deformities or cyanosis. NEUROLOGIC: No focal weakness or paresthesias are detected. SKIN: He has phalangeal ectatic and spider veins bilaterally. PSYCHIATRIC: The patient has a normal affect.  DATA:  I have independently interpreted his venous duplex scan today. There is no evidence of DVT in either lower extremity. There is no evidence of reflux in either saphenous vein. The only area of some reflux noted is in the left common femoral vein and left popliteal vein.  MEDICAL ISSUES: Patient has spider veins and also some mild venous reflux in the left leg and the deep veins as noted above. We have discussed the importance of intermittent leg elevation and the proper positioning for this. I've also written him a prescription for knee-high compression stockings with a 15-20 mm of mercury pressure gradient. We did discuss potentially considering sclerotherapy for  his spider veins. However I reassured him that he had no evidence of arterial problems and that there was no evidence of reflux in the saphenous veins and therefore he was not a candidate for any venous ablation procedures. I will see him back as needed.   DICKSON,CHRISTOPHER S [facility] [place]: [phone]

## 2012-11-04 ENCOUNTER — Ambulatory Visit: Payer: BC Managed Care – PPO | Admitting: Occupational Therapy

## 2012-11-04 NOTE — Telephone Encounter (Signed)
Pt never returned my call. Appt has been r/s to 11/29/12. LM on VM advising pt appt has been r/s. Pt to call with any questions / Sherri

## 2012-11-05 ENCOUNTER — Encounter (INDEPENDENT_AMBULATORY_CARE_PROVIDER_SITE_OTHER): Payer: BC Managed Care – PPO | Admitting: Surgery

## 2012-11-05 ENCOUNTER — Encounter: Payer: Self-pay | Admitting: Neurology

## 2012-11-05 ENCOUNTER — Ambulatory Visit (INDEPENDENT_AMBULATORY_CARE_PROVIDER_SITE_OTHER): Payer: BC Managed Care – PPO | Admitting: Neurology

## 2012-11-05 ENCOUNTER — Other Ambulatory Visit: Payer: Self-pay | Admitting: *Deleted

## 2012-11-05 VITALS — BP 147/85 | HR 84 | Temp 97.9°F | Ht 70.0 in | Wt 188.0 lb

## 2012-11-05 DIAGNOSIS — G4730 Sleep apnea, unspecified: Secondary | ICD-10-CM | POA: Insufficient documentation

## 2012-11-05 DIAGNOSIS — G4710 Hypersomnia, unspecified: Secondary | ICD-10-CM

## 2012-11-05 HISTORY — DX: Sleep apnea, unspecified: G47.30

## 2012-11-05 HISTORY — DX: Hypersomnia, unspecified: G47.10

## 2012-11-05 NOTE — Patient Instructions (Signed)
CPAP and BIPAP CPAP and BIPAP are methods of helping you breathe. CPAP stands for "continuous positive airway pressure." BIPAP stands for "bi-level positive airway pressure." Both CPAP and BIPAP are provided by a small machine with a flexible plastic tube that attaches to a plastic mask that goes over your nose or mouth. Air is blown into your air passages through your nose or mouth. This helps to keep your airways open and helps to keep you breathing well. The amount of pressure that is used to blow the air into your air passages can be set on the machine. The pressure setting is based on your needs. With CPAP, the amount of pressure stays the same while you breathe in and out. With BIPAP, the amount of pressure changes when you inhale and exhale. Your caregiver will recommend whether CPAP or BIPAP would be more helpful for you.  CPAP and BIPAP can be helpful for both adults and children with:  Sleep apnea.  Chronic Obstructive Pulmonary Disease (COPD), a condition like emphysema.  Diseases which weaken the muscles of the chest such as muscular dystrophy or neurological diseases.  Other problems that cause breathing to be weak or difficult. USE OF CPAP OR BIPAP The respiratory therapist or technician will help you get used to wearing the mask. Some people feel claustrophobic (a trapped or closed in feeling) at first, because the mask needs to be fairly snug on your face.   It may help you to get used to the mask gradually, by first holding the mask loosely over your nose or mouth using a low pressure setting on the machine. Gradually the mask can be applied more snugly with increased pressure. You can also gradually increase the amount of time the mask is used.  People with sleep apnea will use the mask and machine at night when they are sleeping. Others, like those with ALS or other breathing difficulties, may need the CPAP or BIPAP all the time.  If the first mask you try does not fit well, or  is uncomfortable, there are other types and sizes that can be tried.  If you tend to breathe through your mouth, a chin strap may be applied to help keep your mouth closed (if you are using a nasal mask).  The CPAP and BIPAP machines have alarms that may sound if the mask comes off or develops a leak.  You should not eat or drink while the CPAP or BIPAP is on. Food or fluids could get pushed into your lungs by the pressure of the CPAP or BIPAP. Sometimes CPAP or BIPAP machines are ordered for home use. If you are going to use the CPAP or BIPAP machine at home, follow these instructions  CPAP or BIPAP machines can be rented or purchased through home health care companies. There are many different brands of machines available. If you rent a machine before purchasing you may find which particular machine works well for you.  Ask questions if there is something you do not understand when picking out your machine.  Place your CPAP or BIPAP machine on a secure table or stand near an electrical outlet.  Know where the On/Off switch is.  Follow your doctor's instructions for how to set the pressure on your machine and when you should use it.  Do not smoke! Tobacco smoke residue can damage the machine. SEEK IMMEDIATE MEDICAL CARE IF:   You have redness or open areas around your nose or mouth.  You have trouble operating   the CPAP or BIPAP machine.  You cannot tolerate wearing the CPAP or BIPAP mask.  You have any questions or concerns. Document Released: 02/29/2004 Document Revised: 08/25/2011 Document Reviewed: 05/30/2008 Exit[employer] Patient Information 2014 [employer]. Sleep Apnea Sleep apnea is disorder that affects a person's sleep. A person with sleep apnea has abnormal pauses in their breathing when they sleep. It is hard for them to get a good sleep. This makes a person tired during the day. It also can lead to other physical problems. There are three types of sleep apnea. One type is  when breathing stops for a short time because your airway is blocked (obstructive sleep apnea). Another type is when the brain sometimes fails to give the normal signal to breathe to the muscles that control your breathing (central sleep apnea). The third type is a combination of the other two types. HOME CARE  Do not sleep on your back. Try to sleep on your side.  Take all medicine as told by your doctor.  Avoid alcohol, calming medicines (sedatives), and depressant drugs.  Try to lose weight if you are overweight. Talk to your doctor about a healthy weight goal. Your doctor may have you use a device that helps to open your airway. It can help you get the air that you need. It is called a positive airway pressure (PAP) device. There are three types of PAP devices:  Continuous positive airway pressure (CPAP) device.  Nasal expiratory positive airway pressure (EPAP) device.  Bilevel positive airway pressure (BPAP) device. MAKE SURE YOU:  Understand these instructions.  Will watch your condition.  Will get help right away if you are not doing well or get worse. Document Released: 03/11/2008 Document Revised: 05/19/2012 Document Reviewed: 10/04/2011 Exit[employer] Patient Information 2014 [employer].  

## 2012-11-05 NOTE — Progress Notes (Signed)
[facility]  Provider:  Dr Bowie Doiron Referring Provider: Lelon Perla, DO Primary Care Physician:  Loreen Freud, DO  Chief Complaint  Patient presents with  . Insomnia    Cpap, aware to bring machine and mask#10    HPI:  Chase [name] is a 52 y.o. male here as a referral from Dr. Smiley Houseman for further  Sleep evaluation. She has been followed by Dr. Marcelyn Bruins  before 2009, and was diagnosed by him as obstructive sleep apnea and placed on CPAP. He has been intermittently using CPAP, having tried many different marks at all were neither comfortable nor properly feeling. The last encounter was advanced on care was just last week I understand that he was again changed to a different miles which worked one night for him and has not since. He today and Doris the Epworth sleepiness score at 18 points. He is non compliant with CPAP and sleep hygiene instructions.   Mr. Beckers has long-standing other medical problems, he has seen dr Sandria Manly - for headaches and memory loss.  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 Iran Planas office. At one time he had an area of punctate subacute stroke in the left frontal parietal white matter on the MRI worsened 10/17/2011. 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 Dr. Rolm Gala 2007 for memory test which was at that time 28 or 30 points.  The patient has not been working since September 2008 patient reports having had a lateral strokes which are both normal he but has never seen Dr. Pearlean Brownie in followup. He has long-standing hearing loss and in the past vertical, he has been followed by Dr. Dorma Russell  and Joneen Roach. He has a history of irregular heart beats. He has degenerative disc disease at the cervical spine.  The patient does not endorse a regular bedtime, his sleep habits very. He tries to be and that at 2 AM he estimates his sleep time varies widely as well  between 4 and 8 hours at night. Depending on his plans for the day appointments etc. he may arise at 8 or much later, at noon. He snores loudly , and was in 2009  referred by Dr Scotty Court to a sleep study.   His settings are not documented, he uses currently a FFM quattro, but felt this was dislodging easily.   Epworth 18.   Depression Beck's score 18.     Review of Systems: Out of a complete 14 system review, the patient complains of only the following symptoms, and all other reviewed systems are negative. See above   History   Social History  . Marital Status: Single    Spouse Name: N/A    Number of Children: N/A  . Years of Education: N/A   Occupational History  . Not on file.   Social History Main Topics  . Smoking status: Never Smoker   . Smokeless tobacco: Never Used  . Alcohol Use: No     Comment: none  . Drug Use: No  . Sexually Active: No   Other Topics Concern  . Not on file   Social History Narrative  . No narrative on file    Family History  Problem Relation Age of Onset  . Cancer Mother 35    breast  . Stroke Father   . Heart disease Father   . Hypertension Father   . Heart attack  Father   . Hypertension Brother   . Hyperlipidemia Neg Hx   . Diabetes Neg Hx     Past Medical History  Diagnosis Date  . Allergy   . Stroke   . Sleep apnea   . Headache   . Arthritis   . GERD (gastroesophageal reflux disease)   . Hypertension   . Hyperlipidemia   . Hearing loss   . Nasal congestion   . Trouble swallowing   . Blood in stool   . Chest pain   . Leg swelling   . Constipation   . Rectal pain   . Difficulty urinating   . Thrombosed hemorrhoids   . Brain cyst   . PONV (postoperative nausea and vomiting)   . Diabetes mellitus without complication     Past Surgical History  Procedure Laterality Date  . Cataract extraction      x 3  . Inner ear surgery      rt ear  . Eye surgery  1968, 1985, 1987    cataracts  . Inner ear surgery  1992   . Surgery - right arm      1983    Current Outpatient Prescriptions  Medication Sig Dispense Refill  . allopurinol (ZYLOPRIM) 100 MG tablet TAKE 1 TABLET BY MOUTH DAILY.  90 tablet  1  . Armodafinil 150 MG tablet Take 1 tablet (150 mg total) by mouth daily.  30 tablet  1  . azelastine (ASTELIN) 137 MCG/SPRAY nasal spray PLACE 2 SPRAYS INTO THE NOSE TWICE A DAY. PUT IN EACH NOSTRIL AS DIRECTED  30 mL  1  . Beclomethasone Dipropionate (QNASL) 80 MCG/ACT AERS Place 2 sprays into the nose daily.      . Blood Glucose Monitoring Suppl (FREESTYLE INSULINX SYSTEM) W/DEVICE KIT 1 Device by Does not apply route daily.  1 kit  0  . Blood Glucose Monitoring Suppl (ONETOUCH VERIO IQ SYSTEM) W/DEVICE KIT by Does not apply route.      . cetirizine (ZYRTEC) 10 MG tablet Take 1 tablet (10 mg total) by mouth daily.  30 tablet  11  . diazepam (VALIUM) 5 MG tablet TAKE 1 TABLET BY MOUTH 3 TIMES A DAY  90 tablet  1  . FREESTYLE INSULINX TEST test strip       . glucose blood (FREESTYLE INSULINX TEST) test strip Use as instructed test 4 (four) times daily. Dx. 250.00  100 each  1  . glucose blood test strip Use as instructed  100 each  12  . HYDROcodone-acetaminophen (HYCET) 7.5-325 mg/15 ml solution Take 15 mLs by mouth every 6 (six) hours as needed for pain.  120 mL  0  . hydrocortisone (ANUSOL-HC) 25 MG suppository Place 1 suppository (25 mg total) rectally 2 (two) times daily.  12 suppository  0  . hydrOXYzine (ATARAX/VISTARIL) 25 MG tablet TAKE 1 TABLET AT BEDTIME  30 tablet  6  . ibuprofen (ADVIL,MOTRIN) 100 MG/5ML suspension Take 200 mg by mouth every 4 (four) hours as needed for fever.      . Lancets (FREESTYLE) lancets Use as instructed test 4 (four) times daily. Dx. 250.00  100 each  1  . Lancets (FREESTYLE) lancets       . meclizine (ANTIVERT) 25 MG tablet Take 1 tablet (25 mg total) by mouth 3 (three) times daily as needed.  60 tablet  0  . Metformin HCl 500 MG/5ML SOLN 1 tsp po bid  300 mL  2  .  metoprolol tartrate (LOPRESSOR)  25 MG tablet Take 1 tablet (25 mg total) by mouth 2 (two) times daily.  60 tablet  2  . Multiple Vitamin (MULTIVITAMIN) tablet Take 1 tablet by mouth daily. GummyMultivitamin      . Phenylephrine-DM-GG (MUCINEX FAST-MAX CONGEST COUGH) 2.5-5-100 MG/5ML LIQD Take 20 mLs by mouth as needed.      . predniSONE (DELTASONE) 10 MG tablet 3 po qd for 3 days then 2 po qd for 3 days the 1 po qd for 3 days  18 tablet  0  . promethazine (PHENERGAN) 25 MG suppository Place 1 suppository (25 mg total) rectally 2 (two) times daily as needed for nausea.  30 each  0  . ranitidine (ZANTAC) 150 MG/10ML syrup Take 150 mg by mouth 2 (two) times daily as needed. For indigestion.      Marland Kitchen venlafaxine XR (EFFEXOR-XR) 37.5 MG 24 hr capsule Take 1 capsule (37.5 mg total) by mouth daily.  90 capsule  0  . HYDROcodone-homatropine (HYCODAN) 5-1.5 MG/5ML syrup TAKE 1 TEASPOONFUL BY MOUTH EVERY 8 HOURS AS NEEDED FOR COUGH  120 mL  0  . hydrocortisone-pramoxine (ANALPRAM-HC) 2.5-1 % rectal cream Place 1 application rectally 2 (two) times daily as needed. For hemorroids.  30 g  2   No current facility-administered medications for this visit.    Allergies as of 11/05/2012 - Review Complete 11/05/2012  Allergen Reaction Noted  . Amoxicillin Hives, Itching, and Other (See Comments) 12/23/2006  . Terfenadine  12/09/2007    Vitals: BP 147/85  Pulse 84  Temp(Src) 97.9 F (36.6 C) (Oral)  Ht 5\' 10"  (1.778 m)  Wt 188 lb (85.276 kg)  BMI 26.98 kg/m2 Last Weight:  Wt Readings from Last 1 Encounters:  11/05/12 188 lb (85.276 kg)   Last Height:   Ht Readings from Last 1 Encounters:  11/05/12 5\' 10"  (1.778 m)   Vision Screening:  See vitals  Physical exam:  General: The patient is awake, alert and appears not in acute distress. The patient is well groomed. Head: Normocephalic, atraumatic . Neck is supple. Mallampati 1- 2 , TMJ evident, no nasal septal deviation, retrognathia is present.  Rhinitis , treated with Zyrtec, Astilin. . , neck circumference:15 inches . Cardiovascular:  Regular rate and rhythm , without  murmurs or carotid bruit, and without distended neck veins. Respiratory: Lungs are clear to auscultation. Skin:  Without evidence of edema,  He has facial redness,  Trunk: BMI is normal .  Neurologic exam : The patient is awake and alert, oriented to place and time.  Memory subjective  described as impaired . There is a reduced  attention span & concentration ability. Speech is fluent without dysarthria, dysphonia or aphasia. Mood and affect are  Depressed, he is frustrated and angry.   Cranial nerves: Pupils are equal and briskly reactive to light. Right sided vision loss, status post bilateral cataract,  Funduscopic exam without  evidence of pallor or edema.Marland Kitchen Hearing loss - right sided  deficit is evident  While on  hearing aids.  Hearing to finger rub intact.  Facial sensation intact to fine touch. Facial motor strength is symmetric and tongue and uvula move midline.  Motor exam:  5/5Normal tone and normal muscle bulk and symmetric normal strength in all extremities.  Sensory:  Fine touch, pinprick and vibration were tested in all extremities. Proprioception is tested in the upper extremities  normal.  Coordination: Rapid alternating movements in the fingers/hands is tested and normal. Finger-to-nose maneuver tested . Gait and station: Patient  walks without assistive device and is able and assisted stool climb up to the exam table. Strength within normal limits.    Deep tendon reflexes: in the  upper and lower extremities are symmetric and intact. Babinski maneuver response isdowngoing.   Assessment:  After physical and neurologic examination, review of laboratory studies, imaging, neurophysiology testing and pre-existing records, assessment will be reviewed on the problem list.  Plan:  Treatment plan and additional workup will be reviewed under Problem  List.   #1 incomplete treatment of obstructive sleep apnea. 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  he has a low Mallampati, and retrognathia, especially loud snoring when in supine position. Clinically and by physical exam, I would have recommended to sleep on the side. The patient assured me that he can because of physical discomfort not sleep in nonsupine position. A dental treatment such as an advanced mandibular device will not be suitable for patient his TMJ. An ENT intervention is not necessary as the patient has a low-grade Mallampati. It remains to use CPAP with a nasal pillow and to try to avoid a chin strap. Recommend to ignore the oral leak in order to first treat the apnea hypotony index. The patient is aware that he has been better rested when using CPAP.  Over this the patient extensive educational material about the origins of apnea, treatment options as discussed today and also risks of leaving and apnea untreated.  He already has arrhythmia and strokes, his paternal family history for OSA and CVA is strong. His mother had Huntington's Disease.    The patient is instructed to try a nasal pillow at home , bring a download form [facility] and his old sleep studies for an appointment. He needs to be evaluated for central versus OSA and treated accordingly. A repeat sleep study , gentle titration on nasal pillow is requested. I will follow this gentleman for his sleep related problems if he is compliant with my treatment  , but will not be his general neurologist.

## 2012-11-07 ENCOUNTER — Encounter: Payer: Self-pay | Admitting: Neurology

## 2012-11-09 ENCOUNTER — Ambulatory Visit (INDEPENDENT_AMBULATORY_CARE_PROVIDER_SITE_OTHER): Payer: BC Managed Care – PPO | Admitting: Family Medicine

## 2012-11-09 ENCOUNTER — Ambulatory Visit: Payer: BC Managed Care – PPO | Admitting: Occupational Therapy

## 2012-11-09 ENCOUNTER — Encounter: Payer: Self-pay | Admitting: Family Medicine

## 2012-11-09 VITALS — BP 138/86 | HR 76 | Ht 70.0 in | Wt 188.0 lb

## 2012-11-09 DIAGNOSIS — R269 Unspecified abnormalities of gait and mobility: Secondary | ICD-10-CM

## 2012-11-09 DIAGNOSIS — M79609 Pain in unspecified limb: Secondary | ICD-10-CM

## 2012-11-09 DIAGNOSIS — M79644 Pain in right finger(s): Secondary | ICD-10-CM

## 2012-11-09 NOTE — Telephone Encounter (Signed)
Please ask patient to bring [place] the download. I am sorry he had sucha a hard time with his DME.

## 2012-11-09 NOTE — Telephone Encounter (Signed)
Have patient bring the chip card here or the download if he can he do a download at home... CD

## 2012-11-09 NOTE — Telephone Encounter (Signed)
Please advise 

## 2012-11-09 NOTE — Telephone Encounter (Signed)
Please respond to this message from [place].

## 2012-11-09 NOTE — Patient Instructions (Addendum)
I would expect about 2 more weeks of soreness in your finger though in occasional cases it can take longer. You have excellent strength and motion though so this should continue to improve without any problems.  You do overpronate but also turn your feet out (usually for stability purposes). Start with the sports insoles (we can always add scaphoid pads for arch support later) to help control this. These typically last 4-6 months. Custom orthotics are a consideration but usually not done unless you like how the temporary sports insoles feel. We could also show you how to order these sports insoles if you like how they feel. Consider the gait training I showed you (line drill) though I'd be concerned about it throwing off your balance and making you at increased risk of falling. Follow up with me in 1 month.

## 2012-11-10 ENCOUNTER — Encounter: Payer: Self-pay | Admitting: Family Medicine

## 2012-11-10 DIAGNOSIS — R269 Unspecified abnormalities of gait and mobility: Secondary | ICD-10-CM | POA: Insufficient documentation

## 2012-11-10 HISTORY — DX: Unspecified abnormalities of gait and mobility: R26.9

## 2012-11-10 NOTE — Assessment & Plan Note (Signed)
consistent with flexor tendon strain.  Improved.  Discontinue buddy taping.  Tendons intact with excellent strength.  Continue with home exercise program daily until pain resolved.

## 2012-11-10 NOTE — Progress Notes (Signed)
Subjective:    Patient ID: Chase [name], male    DOB: [birth year], 52 y.o.   MRN: [id number]  PCP: Dr. Laury Axon  HPI  52 yo M here for f/u right index finger injury.  4/28: Patient reports he was pulling his boots on on 4/1 when he felt a pop within right index finger with intense pain. Couldn't really localize where pain started - was just throughout this finger. Associated with swelling. Pain was about 8/10 level for several minutes then started to improve. Sought care - radiographs negative for a fracture. Right handed. Has been using a U shaped splint but feels worse in this. Has improved but not completely so.  5/27: Patient reports he has been doing OT - last visit on Thursday. Doing well with this and home exercises. No longer buddy taping fingers together regularly. Has full motion now but still some stiffness and swelling. Also asking about arch support, his gait - has had problems with back and lower extremities. Sees a chiropractor regularly and had orthotics made by them once - were changed some so he didn't supinate as much per his report. Just today he had another mold made for new orthotics but didn't mention this until end of today's visit.  Past Medical History  Diagnosis Date  . Allergy   . Stroke   . Sleep apnea   . Headache(784.0)   . Arthritis   . GERD (gastroesophageal reflux disease)   . Hypertension   . Hyperlipidemia   . Hearing loss   . Nasal congestion   . Trouble swallowing   . Blood in stool   . Chest pain   . Leg swelling   . Constipation   . Rectal pain   . Difficulty urinating   . Thrombosed hemorrhoids   . Brain cyst   . PONV (postoperative nausea and vomiting)   . Diabetes mellitus without complication     Current Outpatient Prescriptions on File Prior to Visit  Medication Sig Dispense Refill  . allopurinol (ZYLOPRIM) 100 MG tablet TAKE 1 TABLET BY MOUTH DAILY.  90 tablet  1  . Armodafinil 150 MG tablet Take 1 tablet (150 mg  total) by mouth daily.  30 tablet  1  . azelastine (ASTELIN) 137 MCG/SPRAY nasal spray PLACE 2 SPRAYS INTO THE NOSE TWICE A DAY. PUT IN EACH NOSTRIL AS DIRECTED  30 mL  1  . Beclomethasone Dipropionate (QNASL) 80 MCG/ACT AERS Place 2 sprays into the nose daily.      . Blood Glucose Monitoring Suppl (FREESTYLE INSULINX SYSTEM) W/DEVICE KIT 1 Device by Does not apply route daily.  1 kit  0  . Blood Glucose Monitoring Suppl (ONETOUCH VERIO IQ SYSTEM) W/DEVICE KIT by Does not apply route.      . cetirizine (ZYRTEC) 10 MG tablet Take 1 tablet (10 mg total) by mouth daily.  30 tablet  11  . diazepam (VALIUM) 5 MG tablet TAKE 1 TABLET BY MOUTH 3 TIMES A DAY  90 tablet  1  . FREESTYLE INSULINX TEST test strip       . glucose blood (FREESTYLE INSULINX TEST) test strip Use as instructed test 4 (four) times daily. Dx. 250.00  100 each  1  . glucose blood test strip Use as instructed  100 each  12  . HYDROcodone-acetaminophen (HYCET) 7.5-325 mg/15 ml solution Take 15 mLs by mouth every 6 (six) hours as needed for pain.  120 mL  0  . hydrocortisone (ANUSOL-HC) 25 MG suppository Place  1 suppository (25 mg total) rectally 2 (two) times daily.  12 suppository  0  . hydrocortisone-pramoxine (ANALPRAM-HC) 2.5-1 % rectal cream Place 1 application rectally 2 (two) times daily as needed. For hemorroids.  30 g  2  . hydrOXYzine (ATARAX/VISTARIL) 25 MG tablet TAKE 1 TABLET AT BEDTIME  30 tablet  6  . ibuprofen (ADVIL,MOTRIN) 100 MG/5ML suspension Take 200 mg by mouth every 4 (four) hours as needed for fever.      . Lancets (FREESTYLE) lancets Use as instructed test 4 (four) times daily. Dx. 250.00  100 each  1  . Lancets (FREESTYLE) lancets       . meclizine (ANTIVERT) 25 MG tablet Take 1 tablet (25 mg total) by mouth 3 (three) times daily as needed.  60 tablet  0  . Metformin HCl 500 MG/5ML SOLN 1 tsp po bid  300 mL  2  . metoprolol tartrate (LOPRESSOR) 25 MG tablet Take 1 tablet (25 mg total) by mouth 2 (two) times  daily.  60 tablet  2  . Multiple Vitamin (MULTIVITAMIN) tablet Take 1 tablet by mouth daily. GummyMultivitamin      . Phenylephrine-DM-GG (MUCINEX FAST-MAX CONGEST COUGH) 2.5-5-100 MG/5ML LIQD Take 20 mLs by mouth as needed.      . predniSONE (DELTASONE) 10 MG tablet 3 po qd for 3 days then 2 po qd for 3 days the 1 po qd for 3 days  18 tablet  0  . promethazine (PHENERGAN) 25 MG suppository Place 1 suppository (25 mg total) rectally 2 (two) times daily as needed for nausea.  30 each  0  . ranitidine (ZANTAC) 150 MG/10ML syrup Take 150 mg by mouth 2 (two) times daily as needed. For indigestion.      Marland Kitchen venlafaxine XR (EFFEXOR-XR) 37.5 MG 24 hr capsule Take 1 capsule (37.5 mg total) by mouth daily.  90 capsule  0   No current facility-administered medications on file prior to visit.    Past Surgical History  Procedure Laterality Date  . Cataract extraction      x 3  . Inner ear surgery      rt ear  . Eye surgery  1968, 1985, 1987    cataracts  . Inner ear surgery  1992  . Surgery - right arm      1983    Allergies  Allergen Reactions  . Amoxicillin Hives, Itching and Other (See Comments)    White tongue; ? hives  . Terfenadine     ? reaction    History   Social History  . Marital Status: Single    Spouse Name: N/A    Number of Children: N/A  . Years of Education: N/A   Occupational History  . Not on file.   Social History Main Topics  . Smoking status: Never Smoker   . Smokeless tobacco: Never Used  . Alcohol Use: No     Comment: none  . Drug Use: No  . Sexually Active: No   Other Topics Concern  . Not on file   Social History Narrative  . No narrative on file    Family History  Problem Relation Age of Onset  . Cancer Mother 62    breast  . Stroke Father   . Heart disease Father   . Hypertension Father   . Heart attack Father   . Hypertension Brother   . Hyperlipidemia Neg Hx   . Diabetes Neg Hx     BP 138/86  Pulse 76  Ht 5\' 10"  (1.778 m)  Wt  188 lb (85.276 kg)  BMI 26.98 kg/m2  Review of Systems  See HPI above.    Objective:   Physical Exam  Gen: NAD  R hand: No swelling, bruising, other deformity. Very mild TTP mainly throughout volar index finger at PIP joint.  No other TTP. Able to flex and extend against resistance at MCP, PIP, and DIP joints.  Pain with flexion at PIP but 5/5 strength.  FROM. Collateral ligaments intact. NVI distally.  Bilateral feet: Outturns right > left foot when ambulating but both with a wider base. Overpronation with pes planus.    Assessment & Plan:  1. Right index finger injury - consistent with flexor tendon strain.  Improved.  Discontinue buddy taping.  Tendons intact with excellent strength.  Continue with home exercise program daily until pain resolved.  2. Gait abnormality - spent over 30 minutes of visit reviewing his gait with him, overpronation, trying sports insoles on, and answering questions.  He ultimately decided not to take sports insoles - didn't mention he just was fitted for new custom orthotics by his chiropractor until the end of the visit.  He should try these first before trying other adjustments.  Discussed gait training as well but this may predispose him to falls.  After long discussion advised he change inserts first and consider gait training only if these don't help him by themselves.  Total visit time about 50 minutes over half of which spent on counseling.

## 2012-11-10 NOTE — Assessment & Plan Note (Signed)
spent about 30 minutes of visit reviewing his gait with him, overpronation, trying sports insoles on, and answering questions.  He ultimately decided not to take sports insoles - didn't mention he just was fitted for new custom orthotics by his chiropractor until the end of the visit.  He should try these first before trying other adjustments.  Discussed gait training as well but this may predispose him to falls.  After long discussion advised he change inserts first and consider gait training only if these don't help him by themselves.

## 2012-11-11 ENCOUNTER — Ambulatory Visit: Payer: BC Managed Care – PPO | Admitting: Occupational Therapy

## 2012-11-15 ENCOUNTER — Other Ambulatory Visit: Payer: Self-pay | Admitting: General Practice

## 2012-11-15 DIAGNOSIS — K649 Unspecified hemorrhoids: Secondary | ICD-10-CM

## 2012-11-15 MED ORDER — HYDROCORTISONE ACETATE 25 MG RE SUPP: 25.0000 mg | Freq: Two times a day (BID) | RECTAL | Status: DC

## 2012-11-15 NOTE — Telephone Encounter (Signed)
Med filled.  

## 2012-11-16 ENCOUNTER — Encounter: Payer: BC Managed Care – PPO | Admitting: *Deleted

## 2012-11-17 ENCOUNTER — Ambulatory Visit (INDEPENDENT_AMBULATORY_CARE_PROVIDER_SITE_OTHER): Payer: BC Managed Care – PPO

## 2012-11-17 ENCOUNTER — Ambulatory Visit: Payer: No Typology Code available for payment source | Admitting: Neurology

## 2012-11-17 DIAGNOSIS — G4730 Sleep apnea, unspecified: Secondary | ICD-10-CM

## 2012-11-17 DIAGNOSIS — G4733 Obstructive sleep apnea (adult) (pediatric): Secondary | ICD-10-CM

## 2012-11-17 DIAGNOSIS — G4710 Hypersomnia, unspecified: Secondary | ICD-10-CM

## 2012-11-17 DIAGNOSIS — G4737 Central sleep apnea in conditions classified elsewhere: Secondary | ICD-10-CM

## 2012-11-18 ENCOUNTER — Encounter: Payer: BC Managed Care – PPO | Admitting: Occupational Therapy

## 2012-11-23 ENCOUNTER — Encounter: Payer: BC Managed Care – PPO | Admitting: Occupational Therapy

## 2012-11-25 ENCOUNTER — Encounter: Payer: BC Managed Care – PPO | Admitting: Occupational Therapy

## 2012-11-29 ENCOUNTER — Encounter: Payer: Self-pay | Admitting: Family Medicine

## 2012-11-29 ENCOUNTER — Ambulatory Visit (INDEPENDENT_AMBULATORY_CARE_PROVIDER_SITE_OTHER): Payer: BC Managed Care – PPO | Admitting: Neurology

## 2012-11-29 ENCOUNTER — Encounter: Payer: Self-pay | Admitting: Neurology

## 2012-11-29 ENCOUNTER — Encounter: Payer: Self-pay | Admitting: Internal Medicine

## 2012-11-29 VITALS — BP 128/76 | HR 80 | Temp 98.0°F | Resp 12 | Ht 70.0 in | Wt 188.0 lb

## 2012-11-29 DIAGNOSIS — E348 Other specified endocrine disorders: Secondary | ICD-10-CM

## 2012-11-29 NOTE — Progress Notes (Signed)
Chase [name] tried individual with unimpressive results.  He has been seen at the [facility] sleep center and had a split study procedure.  He has a followup appointment on June 20 2 reviewed his sleep study results.  In the meantime, he did try a full face mask which he didn't tolerate well and then he was told that his face wasn't suited for a full face mask but he was unable to get a nasal pillow mask because they are returned to his insurance.  He continues with headaches for which she takes hydrocodone or ibuprofen depending on the severity.  He continues with cognitive problems and he is considering applying for disability.  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  Review of systems is positive for difficulty with sleep, depressed mood, frustration, headaches, fatigue and sleep time sleepiness and anhedonia.  Past Medical History  Diagnosis Date  . Allergy   . Stroke   . Sleep apnea   . Headache(784.0)   . Arthritis   . GERD (gastroesophageal reflux disease)   . Hypertension   . Hyperlipidemia   . Hearing loss   . Nasal congestion   . Trouble swallowing   . Blood in stool   . Chest pain   . Leg swelling   . Constipation   . Rectal pain   . Difficulty urinating   . Thrombosed hemorrhoids   . Brain cyst   . PONV (postoperative nausea and vomiting)   . Diabetes mellitus without complication     Current Outpatient Prescriptions on File Prior to Visit  Medication Sig Dispense Refill  . allopurinol (ZYLOPRIM) 100 MG tablet TAKE 1 TABLET BY MOUTH DAILY.  90 tablet  1  . Armodafinil 150 MG tablet Take 1 tablet (150 mg total) by mouth daily.  30 tablet  1  . atorvastatin (LIPITOR) 10 MG tablet       . azelastine (ASTELIN) 137 MCG/SPRAY nasal spray PLACE 2 SPRAYS INTO THE NOSE TWICE A DAY. PUT IN EACH NOSTRIL AS DIRECTED  30 mL  1  . Beclomethasone Dipropionate (QNASL) 80  MCG/ACT AERS Place 2 sprays into the nose daily.      . Blood Glucose Monitoring Suppl (FREESTYLE INSULINX SYSTEM) W/DEVICE KIT 1 Device by Does not apply route daily.  1 kit  0  . Blood Glucose Monitoring Suppl (ONETOUCH VERIO IQ SYSTEM) W/DEVICE KIT by Does not apply route.      . cetirizine (ZYRTEC) 10 MG tablet Take 1 tablet (10 mg total) by mouth daily.  30 tablet  11  . diazepam (VALIUM) 5 MG tablet TAKE 1 TABLET BY MOUTH 3 TIMES A DAY  90 tablet  1  . FREESTYLE INSULINX TEST test strip       . glucose blood (FREESTYLE INSULINX TEST) test strip Use as instructed test 4 (four) times daily. Dx. 250.00  100 each  1  . glucose blood test strip Use as instructed  100 each  12  . HYDROcodone-acetaminophen (HYCET) 7.5-325 mg/15 ml solution Take 15 mLs by mouth every 6 (six) hours as needed for pain.  120 mL  0  . hydrocortisone (ANUSOL-HC) 25 MG suppository Place 1 suppository (25 mg total) rectally 2 (two) times daily.  12 suppository  0  . hydrocortisone-pramoxine (ANALPRAM-HC) 2.5-1 % rectal cream Place 1 application rectally 2 (two) times daily as needed. For hemorroids.  30  g  2  . hydrOXYzine (ATARAX/VISTARIL) 25 MG tablet TAKE 1 TABLET AT BEDTIME  30 tablet  6  . ibuprofen (ADVIL,MOTRIN) 100 MG/5ML suspension Take 200 mg by mouth every 4 (four) hours as needed for fever.      . Lancets (FREESTYLE) lancets Use as instructed test 4 (four) times daily. Dx. 250.00  100 each  1  . Lancets (FREESTYLE) lancets       . meclizine (ANTIVERT) 25 MG tablet Take 1 tablet (25 mg total) by mouth 3 (three) times daily as needed.  60 tablet  0  . Metformin HCl 500 MG/5ML SOLN 1 tsp po bid  300 mL  2  . metoprolol tartrate (LOPRESSOR) 25 MG tablet Take 1 tablet (25 mg total) by mouth 2 (two) times daily.  60 tablet  2  . Multiple Vitamin (MULTIVITAMIN) tablet Take 1 tablet by mouth daily. GummyMultivitamin      . Phenylephrine-DM-GG (MUCINEX FAST-MAX CONGEST COUGH) 2.5-5-100 MG/5ML LIQD Take 20 mLs by mouth  as needed.      . promethazine (PHENERGAN) 25 MG suppository Place 1 suppository (25 mg total) rectally 2 (two) times daily as needed for nausea.  30 each  0  . ranitidine (ZANTAC) 150 MG/10ML syrup Take 150 mg by mouth 2 (two) times daily as needed. For indigestion.      Marland Kitchen venlafaxine XR (EFFEXOR-XR) 37.5 MG 24 hr capsule Take 1 capsule (37.5 mg total) by mouth daily.  90 capsule  0   No current facility-administered medications on file prior to visit.   Amoxicillin and Terfenadine  History   Social History  . Marital Status: Single    Spouse Name: N/A    Number of Children: N/A  . Years of Education: N/A   Occupational History  . Not on file.   Social History Main Topics  . Smoking status: Never Smoker   . Smokeless tobacco: Never Used  . Alcohol Use: No     Comment: none  . Drug Use: No  . Sexually Active: No   Other Topics Concern  . Not on file   Social History Narrative  . No narrative on file    Family History  Problem Relation Age of Onset  . Cancer Mother 42    breast  . Stroke Father   . Heart disease Father   . Hypertension Father   . Heart attack Father   . Hypertension Brother   . Hyperlipidemia Neg Hx   . Diabetes Neg Hx     Alert and oriented x 3.  Memory function appears to be fair.  Concentration and attention are 4 for educational level and background.  Speech is fluent and without significant word finding difficulty.  Is aware of current events.  No carotid bruits detected.  He does appear depressed  Cranial nerve II through XIIreveals ormal optic discs , EOMI, PERLA, facial movement and sensation intact, hearing grossly intact but diminished and uses hearing aids, gag intact,Uvula raises symmetrically and tongue protrudes evenly. Motor strength is 5 over 5 throughout all limbs.  No atrophy, abnormal tone or tremors. Reflexes are 2-3+ and symmetric in the upper and lower extremities Sensory exam is intact. Coordination is intact for fine  movements and rapid alternating movements in all limbs Gait and station are normal.   Impression: 1. Daytime sleepiness and history of possible sleep apnea currently undergoing evaluation of [facility] and having difficulty tolerating CPAP.  Not much of her response to nuvigal starting dose.  2. Pineal cyst is stable.  Headaches persist.  Plan: 1. We will defer sleep management and decisions about medication for wakefulness to [facility] sleep [facility]. 2. The pineal cyst appear stable.  A six-month or trauma the plan would be reasonable but at this point he prefers to return p.r.n..

## 2012-11-29 NOTE — Patient Instructions (Addendum)
Follow up as needed

## 2012-11-30 ENCOUNTER — Telehealth: Payer: Self-pay | Admitting: *Deleted

## 2012-11-30 NOTE — Telephone Encounter (Signed)
sh

## 2012-12-01 ENCOUNTER — Ambulatory Visit: Payer: BC Managed Care – PPO | Admitting: *Deleted

## 2012-12-01 ENCOUNTER — Other Ambulatory Visit: Payer: Self-pay | Admitting: Family Medicine

## 2012-12-01 ENCOUNTER — Encounter: Payer: Self-pay | Admitting: *Deleted

## 2012-12-03 ENCOUNTER — Ambulatory Visit (INDEPENDENT_AMBULATORY_CARE_PROVIDER_SITE_OTHER): Payer: Self-pay | Admitting: Neurology

## 2012-12-03 ENCOUNTER — Encounter: Payer: Self-pay | Admitting: Neurology

## 2012-12-03 DIAGNOSIS — G4733 Obstructive sleep apnea (adult) (pediatric): Secondary | ICD-10-CM

## 2012-12-04 NOTE — Progress Notes (Signed)
This patient was one of th few not happy with [facility], and is looking for an alternative DME. CD 

## 2012-12-06 NOTE — Progress Notes (Signed)
Quick Note:  Patient to return for a full night bipap study. Dillyn Menna, MD  ______ 

## 2012-12-14 ENCOUNTER — Other Ambulatory Visit: Payer: Self-pay | Admitting: Family Medicine

## 2012-12-26 ENCOUNTER — Other Ambulatory Visit: Payer: Self-pay | Admitting: Family Medicine

## 2012-12-27 ENCOUNTER — Encounter: Payer: Self-pay | Admitting: Internal Medicine

## 2012-12-27 ENCOUNTER — Encounter: Payer: Self-pay | Admitting: Family Medicine

## 2012-12-30 ENCOUNTER — Telehealth: Payer: Self-pay

## 2012-12-30 NOTE — Telephone Encounter (Signed)
[facility] called to change Rx test strips to 4x daily instead of twice. Gave ok. GF/RN

## 2013-01-15 ENCOUNTER — Encounter: Payer: Self-pay | Admitting: Neurology

## 2013-02-10 ENCOUNTER — Ambulatory Visit (INDEPENDENT_AMBULATORY_CARE_PROVIDER_SITE_OTHER): Payer: BC Managed Care – PPO | Admitting: Family Medicine

## 2013-02-10 ENCOUNTER — Encounter: Payer: Self-pay | Admitting: Family Medicine

## 2013-02-10 VITALS — BP 142/84 | HR 71 | Temp 98.5°F | Wt 190.0 lb

## 2013-02-10 DIAGNOSIS — H9190 Unspecified hearing loss, unspecified ear: Secondary | ICD-10-CM

## 2013-02-10 DIAGNOSIS — D229 Melanocytic nevi, unspecified: Secondary | ICD-10-CM

## 2013-02-10 DIAGNOSIS — K649 Unspecified hemorrhoids: Secondary | ICD-10-CM

## 2013-02-10 DIAGNOSIS — D1800 Hemangioma unspecified site: Secondary | ICD-10-CM

## 2013-02-10 DIAGNOSIS — H9193 Unspecified hearing loss, bilateral: Secondary | ICD-10-CM

## 2013-02-10 DIAGNOSIS — K648 Other hemorrhoids: Secondary | ICD-10-CM

## 2013-02-10 DIAGNOSIS — D492 Neoplasm of unspecified behavior of bone, soft tissue, and skin: Secondary | ICD-10-CM

## 2013-02-10 DIAGNOSIS — K644 Residual hemorrhoidal skin tags: Secondary | ICD-10-CM

## 2013-02-10 NOTE — Patient Instructions (Addendum)
Hearing Loss  A hearing loss is sometimes called deafness. Hearing loss may be partial or total.  CAUSES  Hearing loss may be caused by:   Wax in the ear canal.   Infection of the ear canal.   Infection of the middle ear.   Trauma to the ear or surrounding area.   Fluid in the middle ear.   A hole in the eardrum (perforated eardrum).   Exposure to loud sounds or music.   Problems with the hearing nerve.   Certain medications.  Hearing loss without wax, infection, or a history of injury may mean that the nerve is involved. Hearing loss with severe dizziness, nausea and vomiting or ringing in the ear may suggest a hearing nerve irritation or problems in the middle or inner ear. If hearing loss is untreated, there is a greater likelihood for residual or permanent hearing loss.  DIAGNOSIS  A hearing test (audiometry) assesses hearing loss. The audiometry test needs to be performed by a hearing specialist (audiologist).  TREATMENT  Treatment for recent onset of hearing loss may include:   Ear wax removal.   Medications that kill germs (antibiotics).   Cortisone medications.   Prompt follow up with the appropriate specialist.  Return of hearing depends on the cause of your hearing loss, so proper medical follow-up is important. Some hearing loss may not be reversible, and a caregiver should discuss care and treatment options with you.  SEEK MEDICAL CARE IF:    You have a severe headache, dizziness, or changes in vision.   You have new or increased weakness.   You develop repeated vomiting or other serious medical problems.   You have a fever.  Document Released: 06/02/2005 Document Revised: 08/25/2011 Document Reviewed: 09/27/2009  [employer] Patient Information 2014 [employer].

## 2013-02-12 ENCOUNTER — Encounter: Payer: Self-pay | Admitting: Family Medicine

## 2013-02-12 DIAGNOSIS — D1800 Hemangioma unspecified site: Secondary | ICD-10-CM

## 2013-02-12 DIAGNOSIS — H9193 Unspecified hearing loss, bilateral: Secondary | ICD-10-CM | POA: Insufficient documentation

## 2013-02-12 HISTORY — DX: Hemangioma unspecified site: D18.00

## 2013-02-12 HISTORY — DX: Unspecified hearing loss, bilateral: H91.93

## 2013-02-12 NOTE — Assessment & Plan Note (Signed)
Pt will discuss with [facility]

## 2013-02-12 NOTE — Progress Notes (Signed)
  Subjective:    Patient ID: Chase [name], male    DOB: 05/27/1961, 52 y.o.   MRN: [id number]  HPI Pt here c/o cyst on the back of his neck that bleeds a lot.  He states it is new.   He is also c/o worsening of his hearing even with hearing aids. He would like to see someone different for his hemorrhoids.   He had pictures of them and wants to see someone that will inject them.   Review of Systems As above     Objective:   Physical Exam BP 142/84  Pulse 71  Temp(Src) 98.5 F (36.9 C) (Oral)  Wt 190 lb (86.183 kg)  BMI 27.26 kg/m2  SpO2 96% General appearance: alert, cooperative, appears stated age and no distress Ears: normal TM's and external ear canals both ears and hearing aids b/l ----still unable to hear during hearing test Skin: hemangioma back of neck Pt here 30 min with >50% face to face      Assessment & Plan:

## 2013-02-12 NOTE — Assessment & Plan Note (Signed)
Worsening even with hearing aids Refer to ENT

## 2013-03-03 ENCOUNTER — Other Ambulatory Visit: Payer: Self-pay | Admitting: Family Medicine

## 2013-03-06 ENCOUNTER — Other Ambulatory Visit: Payer: Self-pay | Admitting: Family Medicine

## 2013-03-07 ENCOUNTER — Telehealth: Payer: Self-pay | Admitting: Internal Medicine

## 2013-03-07 ENCOUNTER — Telehealth: Payer: Self-pay | Admitting: General Practice

## 2013-03-07 ENCOUNTER — Other Ambulatory Visit: Payer: Self-pay | Admitting: Internal Medicine

## 2013-03-07 DIAGNOSIS — E1165 Type 2 diabetes mellitus with hyperglycemia: Secondary | ICD-10-CM

## 2013-03-07 MED ORDER — METFORMIN HCL 500 MG PO TABS: 500.0000 mg | ORAL_TABLET | Freq: Two times a day (BID) | ORAL | Status: DC

## 2013-03-07 MED ORDER — METFORMIN HCL 500 MG/5ML PO SOLN: ORAL | Status: DC

## 2013-03-07 NOTE — Telephone Encounter (Signed)
Rx formulary change. Pt cannot afford co-pay.

## 2013-03-07 NOTE — Telephone Encounter (Signed)
[facility] on [address] called regarding pt's metformin prescription. States that you have been going back and forth to determine what the best route since pt has severe gag reflex. Pharmacy is requesting a refill on Glucophage 500mg  (NOT the extended Release) that way pt may crush the tablets.

## 2013-03-07 NOTE — Telephone Encounter (Signed)
Carollee Herter, can you please check with the pharmacy why he cannot still use the liquid Metformin that he has been on? Thank you!

## 2013-03-14 ENCOUNTER — Other Ambulatory Visit: Payer: Self-pay | Admitting: Family Medicine

## 2013-03-15 NOTE — Telephone Encounter (Signed)
Last seen 02/10/13 and filled 10/22/12. Please advise      KP

## 2013-04-01 ENCOUNTER — Other Ambulatory Visit: Payer: Self-pay | Admitting: Family Medicine

## 2013-04-21 ENCOUNTER — Other Ambulatory Visit: Payer: Self-pay

## 2013-04-28 ENCOUNTER — Other Ambulatory Visit: Payer: Self-pay | Admitting: Family Medicine

## 2013-04-28 NOTE — Telephone Encounter (Signed)
Hydroxyzine refilled

## 2013-05-10 ENCOUNTER — Other Ambulatory Visit: Payer: Self-pay | Admitting: Family Medicine

## 2013-05-23 ENCOUNTER — Other Ambulatory Visit: Payer: Self-pay | Admitting: Family Medicine

## 2013-05-24 ENCOUNTER — Other Ambulatory Visit: Payer: Self-pay | Admitting: Family Medicine

## 2013-05-25 MED ORDER — HYDROCODONE-ACETAMINOPHEN 7.5-325 MG/15ML PO SOLN: ORAL | Status: DC

## 2013-05-25 NOTE — Telephone Encounter (Signed)
Last seen 02/10/13 and filled 03/14/13 #120 ml. Please advise      KP

## 2013-05-31 ENCOUNTER — Telehealth: Payer: Self-pay | Admitting: Family Medicine

## 2013-05-31 ENCOUNTER — Other Ambulatory Visit: Payer: Self-pay | Admitting: *Deleted

## 2013-05-31 NOTE — Telephone Encounter (Signed)
Please advise      KP 

## 2013-05-31 NOTE — Telephone Encounter (Signed)
Patient wants to know if an recommendations can be made as to what he can help soothe his LT shoulder pain until his appointment tomorrow. States that pain level in 9 out of 10. Please advise.

## 2013-05-31 NOTE — Telephone Encounter (Deleted)
Medication filled on 05/25/2013

## 2013-05-31 NOTE — Telephone Encounter (Signed)
He has pain meds and valium should help too.  If pain is that bad with narcotic --- he may want to go to ER.

## 2013-05-31 NOTE — Telephone Encounter (Signed)
Patient has been made aware and voiced understanding. He will follow up tomorrow.      KP

## 2013-06-01 ENCOUNTER — Ambulatory Visit (INDEPENDENT_AMBULATORY_CARE_PROVIDER_SITE_OTHER): Payer: BC Managed Care – PPO | Admitting: Family Medicine

## 2013-06-01 ENCOUNTER — Encounter: Payer: Self-pay | Admitting: Family Medicine

## 2013-06-01 VITALS — BP 134/84 | HR 80 | Temp 98.5°F | Wt 198.0 lb

## 2013-06-01 DIAGNOSIS — F411 Generalized anxiety disorder: Secondary | ICD-10-CM

## 2013-06-01 DIAGNOSIS — M25512 Pain in left shoulder: Secondary | ICD-10-CM

## 2013-06-01 DIAGNOSIS — M25519 Pain in unspecified shoulder: Secondary | ICD-10-CM

## 2013-06-01 MED ORDER — DIAZEPAM 5 MG PO TABS: ORAL_TABLET | ORAL | Status: DC

## 2013-06-01 NOTE — Patient Instructions (Signed)

## 2013-06-01 NOTE — Progress Notes (Signed)
Pre visit review using our clinic review tool, if applicable. No additional management support is needed unless otherwise documented below in the visit note. 

## 2013-06-02 ENCOUNTER — Other Ambulatory Visit (INDEPENDENT_AMBULATORY_CARE_PROVIDER_SITE_OTHER): Payer: BC Managed Care – PPO

## 2013-06-02 DIAGNOSIS — E119 Type 2 diabetes mellitus without complications: Secondary | ICD-10-CM

## 2013-06-02 DIAGNOSIS — E785 Hyperlipidemia, unspecified: Secondary | ICD-10-CM

## 2013-06-02 NOTE — Progress Notes (Signed)
  Subjective:    Chase [name] is a 52 y.o. male who presents with left shoulder pain. The symptoms began 1 week ago. Aggravating factors: repetitive activity, reaching overhead . Pain is located L upper arm . Discomfort is described as sharp/stabbing, throbbing and tingling. Symptoms are exacerbated by repetitive movements and overhead movements. Evaluation to date: none. Therapy to date includes: nothing specific.  The following portions of the patient's history were reviewed and updated as appropriate: allergies, current medications, past family history, past medical history, past social history, past surgical history and problem list.  Review of Systems Pertinent items are noted in HPI.   Objective:    BP 134/84  Pulse 80  Temp(Src) 98.5 F (36.9 C) (Oral)  Wt 198 lb (89.812 kg)  SpO2 98% Right shoulder: normal active ROM, no tenderness, no impingement sign  Left shoulder: pain with reaching overhead and palpation deltoid, no swelling or errythema     Assessment:    Left shoulder pain    Plan:    Natural history and expected course discussed. Questions answered. Agricultural engineer distributed. Reduction in offending activity. Gentle ROM exercises. Rest, ice, compression, and elevation (RICE) therapy. NSAIDs per medication orders. Orthopedics referral.

## 2013-06-03 LAB — BASIC METABOLIC PANEL
BUN: 17 mg/dL (ref 6–23)
CO2: 27 mEq/L (ref 19–32)
Calcium: 9.4 mg/dL (ref 8.4–10.5)
Chloride: 104 mEq/L (ref 96–112)
Creatinine, Ser: 1.2 mg/dL (ref 0.4–1.5)
GFR: 70.06 mL/min (ref >60.00)
Glucose, Bld: 113 mg/dL — ABNORMAL HIGH (ref 70–99)
Potassium: 3.7 mEq/L (ref 3.5–5.1)
Sodium: 139 mEq/L (ref 135–145)

## 2013-06-03 LAB — LIPID PANEL
Cholesterol: 202 mg/dL — ABNORMAL HIGH (ref 0–200)
HDL: 35.8 mg/dL — ABNORMAL LOW (ref >39.00)
Total CHOL/HDL Ratio: 6
Triglycerides: 95 mg/dL (ref 0.0–149.0)
VLDL: 19 mg/dL (ref 0.0–40.0)

## 2013-06-03 LAB — LDL CHOLESTEROL, DIRECT: Direct LDL: 148.2 mg/dL

## 2013-06-03 LAB — HEPATIC FUNCTION PANEL
ALT: 22 U/L (ref 0–53)
AST: 21 U/L (ref 0–37)
Albumin: 4.5 g/dL (ref 3.5–5.2)
Alkaline Phosphatase: 64 U/L (ref 39–117)
Bilirubin, Direct: 0.1 mg/dL (ref 0.0–0.3)
Total Bilirubin: 0.5 mg/dL (ref 0.3–1.2)
Total Protein: 7.3 g/dL (ref 6.0–8.3)

## 2013-06-03 LAB — HEMOGLOBIN A1C: Hgb A1c MFr Bld: 6.4 % (ref 4.6–6.5)

## 2013-06-06 ENCOUNTER — Other Ambulatory Visit: Payer: Self-pay

## 2013-06-06 ENCOUNTER — Other Ambulatory Visit: Payer: Self-pay | Admitting: Family Medicine

## 2013-06-06 MED ORDER — ATORVASTATIN CALCIUM 20 MG PO TABS: 20.0000 mg | ORAL_TABLET | Freq: Every day | ORAL | Status: DC

## 2013-06-16 ENCOUNTER — Other Ambulatory Visit: Payer: Self-pay | Admitting: Internal Medicine

## 2013-06-17 NOTE — Telephone Encounter (Signed)
rx refilled per protocol. DJR  

## 2013-07-22 ENCOUNTER — Telehealth: Payer: Self-pay | Admitting: Neurology

## 2013-07-22 NOTE — Telephone Encounter (Signed)
Would like to know about a diagnosis that is in his chart. He would like to know if it is really true or not? The Dx is Huntingtons Disease. Please call. Would like a call today or Monday morning he has an appt Monday morning.

## 2013-07-22 NOTE — Telephone Encounter (Signed)
I called and spoke to pt.   After reading his chart, Centricity with Dr. Tressia Danas notes, pt did not have genetic testing for Huntington's Disease from our notes.   They did talk of it, and referrral made to [facility] but pt had cancelled the appt.  He was asking about putting the diagnosis on his disability forms for a hearing coming up next week.  I told him that from what I could see he had not been diagnosed with Huntingtons since had not had testing,   but has a markedly positive family history of Huntingtons (mother).

## 2013-08-01 DIAGNOSIS — Z0279 Encounter for issue of other medical certificate: Secondary | ICD-10-CM

## 2013-08-05 ENCOUNTER — Other Ambulatory Visit: Payer: Self-pay | Admitting: Family Medicine

## 2013-08-05 MED ORDER — HYDROCODONE-ACETAMINOPHEN 7.5-325 MG/15ML PO SOLN: ORAL | Status: DC

## 2013-08-05 NOTE — Telephone Encounter (Signed)
Last seen 06/01/13 and filled 05/25/13 #120. Please advise      KP

## 2013-08-09 ENCOUNTER — Telehealth: Payer: Self-pay | Admitting: *Deleted

## 2013-08-09 NOTE — Telephone Encounter (Signed)
Received statement of patient ability to do work-related activities by mail. Billing sheet attached and form filled out as much as possible. Placed in folder for Dr. Etter Sjogren. JG//CMA

## 2013-08-15 NOTE — Telephone Encounter (Signed)
Received signed forms. Forms given to [facility]. JG//CMA

## 2013-08-16 ENCOUNTER — Encounter: Payer: Self-pay | Admitting: Family Medicine

## 2013-08-17 ENCOUNTER — Ambulatory Visit (INDEPENDENT_AMBULATORY_CARE_PROVIDER_SITE_OTHER): Payer: BC Managed Care – PPO | Admitting: Nurse Practitioner

## 2013-08-17 ENCOUNTER — Ambulatory Visit: Payer: BC Managed Care – PPO | Admitting: Family Medicine

## 2013-08-17 ENCOUNTER — Encounter: Payer: Self-pay | Admitting: Nurse Practitioner

## 2013-08-17 VITALS — BP 152/84 | HR 82 | Temp 98.3°F | Ht 70.0 in | Wt 200.4 lb

## 2013-08-17 DIAGNOSIS — J019 Acute sinusitis, unspecified: Secondary | ICD-10-CM

## 2013-08-17 MED ORDER — DOXYCYCLINE MONOHYDRATE 25 MG/5ML PO SUSR: 100.0000 mg | Freq: Two times a day (BID) | ORAL | Status: AC

## 2013-08-17 NOTE — Patient Instructions (Signed)
Your sinuses are irritated & dry. Start daily sinus rinses (Neilmed Sinus rinse) for at least 5-7 days. Start antibiotic. Eat yogurt daily at lunch or afternoon to help prevent diarrhea that can be caused by antibiotic. Always humidify CPAP. Please call for re-evaluation if you are not improving.   Sinusitis Sinusitis is redness, soreness, and swelling (inflammation) of the paranasal sinuses. Paranasal sinuses are air pockets within the bones of your face (beneath the eyes, the middle of the forehead, or above the eyes). In healthy paranasal sinuses, mucus is able to drain out, and air is able to circulate through them by way of your nose. However, when your paranasal sinuses are inflamed, mucus and air can become trapped. This can allow bacteria and other germs to grow and cause infection. Sinusitis can develop quickly and last only a short time (acute) or continue over a long period (chronic). Sinusitis that lasts for more than 12 weeks is considered chronic.  CAUSES  Causes of sinusitis include:  Allergies.  Structural abnormalities, such as displacement of the cartilage that separates your nostrils (deviated septum), which can decrease the air flow through your nose and sinuses and affect sinus drainage.  Functional abnormalities, such as when the small hairs (cilia) that line your sinuses and help remove mucus do not work properly or are not present. SYMPTOMS  Symptoms of acute and chronic sinusitis are the same. The primary symptoms are pain and pressure around the affected sinuses. Other symptoms include:  Upper toothache.  Earache.  Headache.  Bad breath.  Decreased sense of smell and taste.  A cough, which worsens when you are lying flat.  Fatigue.  Fever.  Thick drainage from your nose, which often is green and may contain pus (purulent).  Swelling and warmth over the affected sinuses. DIAGNOSIS  Your caregiver will perform a physical exam. During the exam, your  caregiver may:  Look in your nose for signs of abnormal growths in your nostrils (nasal polyps).  Tap over the affected sinus to check for signs of infection.  View the inside of your sinuses (endoscopy) with a special imaging device with a light attached (endoscope), which is inserted into your sinuses. If your caregiver suspects that you have chronic sinusitis, one or more of the following tests may be recommended:  Allergy tests.  Nasal culture A sample of mucus is taken from your nose and sent to a lab and screened for bacteria.  Nasal cytology A sample of mucus is taken from your nose and examined by your caregiver to determine if your sinusitis is related to an allergy. TREATMENT  Most cases of acute sinusitis are related to a viral infection and will resolve on their own within 10 days. Sometimes medicines are prescribed to help relieve symptoms (pain medicine, decongestants, nasal steroid sprays, or saline sprays).  However, for sinusitis related to a bacterial infection, your caregiver will prescribe antibiotic medicines. These are medicines that will help kill the bacteria causing the infection.  Rarely, sinusitis is caused by a fungal infection. In theses cases, your caregiver will prescribe antifungal medicine. For some cases of chronic sinusitis, surgery is needed. Generally, these are cases in which sinusitis recurs more than 3 times per year, despite other treatments. HOME CARE INSTRUCTIONS   Drink plenty of water. Water helps thin the mucus so your sinuses can drain more easily.  Use a humidifier.  Inhale steam 3 to 4 times a day (for example, sit in the bathroom with the shower running).  Apply  a warm, moist washcloth to your face 3 to 4 times a day, or as directed by your caregiver.  Use saline nasal sprays to help moisten and clean your sinuses.  Take over-the-counter or prescription medicines for pain, discomfort, or fever only as directed by your caregiver. SEEK  IMMEDIATE MEDICAL CARE IF:  You have increasing pain or severe headaches.  You have nausea, vomiting, or drowsiness.  You have swelling around your face.  You have vision problems.  You have a stiff neck.  You have difficulty breathing. MAKE SURE YOU:   Understand these instructions.  Will watch your condition.  Will get help right away if you are not doing well or get worse. Document Released: 06/02/2005 Document Revised: 08/25/2011 Document Reviewed: 06/17/2011 [facility] Patient Information 2014 [place], [place].

## 2013-08-17 NOTE — Progress Notes (Signed)
   Subjective:    Patient ID: Chase [name], male    DOB: [birth year], 53 y.o.   MRN: [id number]  Sinusitis This is a chronic problem. The current episode started 1 to 4 weeks ago (3 weeks). The problem has been waxing and waning since onset. There has been no fever. He is experiencing no pain. Associated symptoms include congestion, coughing, headaches, sinus pressure and swollen glands. Pertinent negatives include no chills, hoarse voice, shortness of breath, sneezing or sore throat. Treatments tried: nasal steroid & antihistamine. pt is using cpap without humidification. The treatment provided no relief.      Review of Systems  Constitutional: Negative for fever, chills, activity change, appetite change and fatigue.  HENT: Positive for congestion and sinus pressure. Negative for hoarse voice, sneezing and sore throat.        C/o nose sores & copious "boogers"  Respiratory: Positive for cough. Negative for chest tightness, shortness of breath and wheezing.   Neurological: Positive for headaches.       Objective:   Physical Exam  Vitals reviewed. Constitutional: He is oriented to person, place, and time. He appears well-developed and well-nourished. No distress.  HENT:  Head: Normocephalic and atraumatic.  Right Ear: External ear normal.  Left Ear: External ear normal.  Mouth/Throat: Oropharynx is clear and moist. No oropharyngeal exudate.  Nasal mucosa erythematous & swollen. Dark dry ear wax moderate amt in bilat canals. Tonsils +2 no exudate.   Neck: No thyromegaly present.  Bilat pea sized tonsilar nodes, tender   Cardiovascular: Normal rate, regular rhythm and normal heart sounds.   No murmur heard. Pulmonary/Chest: Effort normal and breath sounds normal. No respiratory distress. He has no wheezes. He has no rales.  Lymphadenopathy:    He has cervical adenopathy.  Neurological: He is alert and oriented to person, place, and time.  Skin: Skin is warm and dry.  Psychiatric:  He has a normal mood and affect. His behavior is normal. Thought content normal.          Assessment & Plan:  1. Sinusitis, acute 3d. Using cpap without humidification - doxycycline (VIBRAMYCIN) 25 MG/5ML SUSR; Take 20 mLs (100 mg total) by mouth 2 (two) times daily. Take 5 days  Dispense: 400 mL; Refill: 0 Daily sinus rinses. Vaseline in nose. Always humidifiy CPAP.

## 2013-08-17 NOTE — Progress Notes (Signed)
Pre visit review using our clinic review tool, if applicable. No additional management support is needed unless otherwise documented below in the visit note. 

## 2013-08-23 NOTE — Telephone Encounter (Signed)
08/23/2013  Faxed request from [employer] back to them at [phone].  Dr. Etter Sjogren signed page 3, beside "No I am unable or unwilling to preform an evaluation".  bw

## 2013-08-24 ENCOUNTER — Other Ambulatory Visit: Payer: Self-pay | Admitting: Family Medicine

## 2013-09-14 ENCOUNTER — Encounter: Payer: Self-pay | Admitting: Family Medicine

## 2013-09-16 ENCOUNTER — Other Ambulatory Visit: Payer: Self-pay | Admitting: Internal Medicine

## 2013-09-23 ENCOUNTER — Other Ambulatory Visit: Payer: Self-pay | Admitting: Family Medicine

## 2013-09-23 ENCOUNTER — Ambulatory Visit (INDEPENDENT_AMBULATORY_CARE_PROVIDER_SITE_OTHER): Payer: BC Managed Care – PPO | Admitting: Family Medicine

## 2013-09-23 ENCOUNTER — Encounter: Payer: Self-pay | Admitting: Family Medicine

## 2013-09-23 VITALS — BP 154/96 | HR 86 | Temp 98.4°F | Wt 201.6 lb

## 2013-09-23 DIAGNOSIS — J329 Chronic sinusitis, unspecified: Secondary | ICD-10-CM

## 2013-09-23 MED ORDER — PREDNISONE 10 MG PO TABS: ORAL_TABLET | ORAL | Status: DC

## 2013-09-23 MED ORDER — SULFAMETHOXAZOLE-TMP DS 800-160 MG PO TABS: 1.0000 | ORAL_TABLET | Freq: Two times a day (BID) | ORAL | Status: DC

## 2013-09-23 MED ORDER — SULFAMETHOXAZOLE-TRIMETHOPRIM 200-40 MG/5ML PO SUSP: 20.0000 mL | Freq: Two times a day (BID) | ORAL | Status: DC

## 2013-09-23 MED ORDER — HYDROCODONE-ACETAMINOPHEN 7.5-325 MG/15ML PO SOLN: ORAL | Status: DC

## 2013-09-23 NOTE — Patient Instructions (Signed)

## 2013-09-23 NOTE — Progress Notes (Signed)
  Subjective:     Chase [name] is a 53 y.o. male who presents for evaluation of sinus pain. Symptoms include: congestion, facial pain, headaches, nasal congestion and sinus pressure. Onset of symptoms was a few weeks ago. Symptoms have been gradually worsening since that time. Past history is significant for no history of pneumonia or bronchitis. Patient is a non-smoker.  The following portions of the patient's history were reviewed and updated as appropriate: allergies, current medications, past family history, past medical history, past social history, past surgical history and problem list.  Review of Systems Pertinent items are noted in HPI.   Objective:    BP 154/96  Pulse 86  Temp(Src) 98.4 F (36.9 C) (Oral)  Wt 201 lb 9.6 oz (91.445 kg)  SpO2 98% General appearance: alert, cooperative, appears stated age and no distress Ears: normal TM's and external ear canals both ears Nose: green discharge, moderate congestion, turbinates red, swollen, sinus tenderness bilateral Throat: abnormal findings: mild oropharyngeal erythema and pnd Neck: mild anterior cervical adenopathy, supple, symmetrical, trachea midline and thyroid not enlarged, symmetric, no tenderness/mass/nodules Lungs: clear to auscultation bilaterally Heart: S1, S2 normal Extremities: extremities normal, atraumatic, no cyanosis or edema Lymph nodes: Cervical adenopathy: mild  b/l    Assessment:    Acute bacterial sinusitis.    Plan:    Nasal steroids per medication orders. Antihistamines per medication orders. Bactrim per medication orders.

## 2013-09-23 NOTE — Progress Notes (Signed)
Pre visit review using our clinic review tool, if applicable. No additional management support is needed unless otherwise documented below in the visit note. 

## 2013-10-05 ENCOUNTER — Encounter (INDEPENDENT_AMBULATORY_CARE_PROVIDER_SITE_OTHER): Payer: Self-pay | Admitting: Surgery

## 2013-10-05 ENCOUNTER — Ambulatory Visit (INDEPENDENT_AMBULATORY_CARE_PROVIDER_SITE_OTHER): Payer: BC Managed Care – PPO | Admitting: Surgery

## 2013-10-05 VITALS — BP 148/90 | HR 120 | Temp 98.7°F | Resp 16 | Ht 70.0 in | Wt 192.8 lb

## 2013-10-05 DIAGNOSIS — K645 Perianal venous thrombosis: Secondary | ICD-10-CM

## 2013-10-05 DIAGNOSIS — K648 Other hemorrhoids: Secondary | ICD-10-CM

## 2013-10-05 DIAGNOSIS — K649 Unspecified hemorrhoids: Secondary | ICD-10-CM

## 2013-10-05 HISTORY — DX: Other hemorrhoids: K64.8

## 2013-10-05 MED ORDER — HYDROCODONE-ACETAMINOPHEN 10-325 MG/15ML PO SOLN: 15.0000 mL | Freq: Four times a day (QID) | ORAL | Status: DC | PRN

## 2013-10-05 NOTE — Telephone Encounter (Signed)
Called patient and he has a apt to see Dr Georgette Dover on 10-05-13 @ 3:30, and he is aware that he will need to be NPO until he comes into the office

## 2013-10-05 NOTE — Patient Instructions (Signed)
If you want to proceed with surgery, please call our surgery schedulers at [phone].

## 2013-10-05 NOTE — Progress Notes (Signed)
HPI  Chase [name] is a 53 y.o. male. Presents with prolapsed hemorrhoids HPI  The patient has had a history of problems with hemorrhoids over several years. He has some chronic constipation and frequently has to strain with bowel movements. Several days ago he did have some constipation. He has developed some prolapse and severe pain.  He has used OTC remedies with little improvement.  He presents now for evaluation.   Past Medical History   Diagnosis  Date   .  Allergy    .  Stroke    .  Sleep apnea    .  Headache    .  Arthritis    .  GERD (gastroesophageal reflux disease)    .  Hypertension    .  Hyperlipidemia    .  Hearing loss    .  Nasal congestion    .  Trouble swallowing    .  Blood in stool    .  Chest pain    .  Leg swelling    .  Constipation    .  Rectal pain    .  Difficulty urinating    .  Thrombosed hemorrhoids      Past Surgical History   Procedure  Date   .  Cataract extraction      x 3   .  Inner ear surgery      rt ear   .  Eye surgery  1968, 1985, 1987     cataracts   .  Inner ear surgery  1992   .  Surgery - right arm      1983     Family History   Problem  Relation  Age of Onset   .  Cancer  Mother  83      breast    .  Stroke  Father    .  Heart disease  Father    Social History  History   Substance Use Topics   .  Smoking status:  Never Smoker   .  Smokeless tobacco:  Never Used   .  Alcohol Use:  No    Allergies   Allergen  Reactions   .  Amoxicillin  Hives, Itching and Other (See Comments)     White tongue   .  Terfenadine                                                                                           Prior to Admission medications   Medication Sig Start Date End Date Taking? Authorizing Provider  allopurinol (ZYLOPRIM) 100 MG tablet TAKE 1 TABLET BY MOUTH DAILY. 04/01/13  Yes Yvonne R Lowne, DO  atorvastatin (LIPITOR) 20 MG tablet Take 1 tablet (20 mg total) by mouth daily. 06/06/13  Yes Yvonne R  Lowne, DO  azelastine (ASTELIN) 137 MCG/SPRAY nasal spray PLACE 2 SPRAYS INTO THE NOSE TWICE A DAY. PUT IN EACH NOSTRIL AS DIRECTED 05/23/13  Yes Alferd Apa Lowne, DO  cetirizine (ZYRTEC) 10 MG tablet Take 1 tablet (10 mg total) by mouth daily. 10/22/12  Yes Rosalita Chessman, DO  diazepam (VALIUM)  5 MG tablet TAKE 1 TABLET BY MOUTH 3 TIMES A DAY 06/01/13  Yes Yvonne R Lowne, DO  FIBER SELECT GUMMIES PO Take 1 tablet by mouth daily.   Yes Historical Provider, MD  fluticasone (FLONASE) 50 MCG/ACT nasal spray PLACE 2 SPRAYS INTO THE NOSE DAILY. 03/06/13  Yes Yvonne R Lowne, DO  glucose blood (BAYER CONTOUR NEXT TEST) test strip 1 each by Other route as needed for other. Use as instructed   Yes Historical Provider, MD  glucose blood (FREESTYLE INSULINX TEST) test strip Use as instructed test 4 (four) times daily. Dx. 250.00 10/21/12  Yes Rosalita Chessman, DO  HYDROcodone-acetaminophen (HYCET) 7.5-325 mg/15 ml solution TAKE 15 ML EVERY 6 HOURS AS NEEDED FOR PAIN 09/23/13  Yes Alferd Apa Lowne, DO  hydrocortisone (ANUSOL-HC) 25 MG suppository PLACE 1 SUPPOSITORY INTO RECTUM TWICE A DAY 05/10/13  Yes Yvonne R Lowne, DO  hydrocortisone-pramoxine (ANALPRAM-HC) 2.5-1 % rectal cream PLACE 1 APPLICATION RECTALLY 2 (TWO) TIMES DAILY AS NEEDED. FOR HEMORROIDS. 12/14/12  Yes Rosalita Chessman, DO  hydrOXYzine (ATARAX/VISTARIL) 25 MG tablet TAKE 1 TABLET AT BEDTIME 06/06/13  Yes Yvonne R Lowne, DO  ibuprofen (ADVIL,MOTRIN) 100 MG/5ML suspension Take 200 mg by mouth every 4 (four) hours as needed for fever.   Yes Historical Provider, MD  Lancets (FREESTYLE) lancets Use as instructed test 4 (four) times daily. Dx. 250.00 10/21/12  Yes Rosalita Chessman, DO  meclizine (ANTIVERT) 25 MG tablet Take 1 tablet (25 mg total) by mouth 3 (three) times daily as needed. 03/22/12  Yes Colon Branch, MD  metFORMIN (GLUCOPHAGE) 500 MG tablet Take 500 mg by mouth daily as needed. 03/07/13  Yes Philemon Kingdom, MD  metoprolol tartrate (LOPRESSOR) 25 MG tablet  TAKE 1 TABLET TWICE A DAY, MAY CRUSH IN FOOD   Yes Rosalita Chessman, DO  Multiple Vitamin (MULTIVITAMIN) tablet Take 1 tablet by mouth daily. GummyMultivitamin   Yes Historical Provider, MD  Phenylephrine-DM-GG (MUCINEX FAST-MAX CONGEST COUGH) 2.5-5-100 MG/5ML LIQD Take 20 mLs by mouth as needed.   Yes Historical Provider, MD  predniSONE (DELTASONE) 10 MG tablet 3 po qd for 3 days then 2 po qd for 3 days the 1 po qd for 3 days 09/23/13  Yes Rosalita Chessman, DO  promethazine (PHENERGAN) 25 MG suppository Place 1 suppository (25 mg total) rectally 2 (two) times daily as needed for nausea. 03/22/12  Yes Colon Branch, MD  ranitidine (ZANTAC) 150 MG/10ML syrup Take 150 mg by mouth 2 (two) times daily as needed. For indigestion.   Yes Historical Provider, MD  sulfamethoxazole-trimethoprim (BACTRIM,SEPTRA) 200-40 MG/5ML suspension Take 20 mLs by mouth 2 (two) times daily. 09/23/13  Yes Yvonne R Lowne, DO  venlafaxine XR (EFFEXOR-XR) 37.5 MG 24 hr capsule TAKE 1 CAPSULE BY MOUTH DAILY. 08/24/13  Yes Rosalita Chessman, DO  Hydrocodone-Acetaminophen 10-325 MG/15ML SOLN Take 15 mLs by mouth every 6 (six) hours as needed. 10/05/13   Imogene Burn. Izora Benn, MD     Review of Systems  Review of Systems  Blood pressure 152/98, pulse 76, temperature 97.6 F (36.4 C), temperature source Temporal, resp. rate 16, height 5\' 11"  (1.803 m), weight 187 lb 3.2 oz (84.913 kg).  Physical Exam  Physical Exam  WDWN in NAD  Rectal - no abscess or fistula noted; No anal fissures noted  The patient has at least two large soft prolapsed internal hemorrhoids with some irritated mucosa Small external hemorrhoids Data Reviewed  None  Assessment  Prolapsed internal hemorrhoids  with external hemorrhoids  Chronic constipation  Plan  Sitz baths frequently - before and after bowel movements  Daily fiber supplement to help regulate bowel movements  Stool softeners as needed if bowel movements are too firm  The patient really needs to have a two  column internal hemorrhoidectomy.  He has concerns about his financial obligations so he wants to think about and try conservative measures. I doubt these will work.  He will call back to schedule surgery if he wants to proceed.  Imogene Burn. Georgette Dover, MD, [facility]  General/ Trauma Surgery  10/05/2013 5:05 PM

## 2013-10-06 ENCOUNTER — Telehealth (INDEPENDENT_AMBULATORY_CARE_PROVIDER_SITE_OTHER): Payer: Self-pay | Admitting: Surgery

## 2013-10-06 NOTE — Telephone Encounter (Signed)
Orders sent to scheduling with note saying patient will call back to schedule.

## 2013-10-07 ENCOUNTER — Telehealth: Payer: Self-pay | Admitting: Family Medicine

## 2013-10-07 ENCOUNTER — Telehealth (INDEPENDENT_AMBULATORY_CARE_PROVIDER_SITE_OTHER): Payer: Self-pay | Admitting: General Surgery

## 2013-10-07 NOTE — Telephone Encounter (Signed)
Too early to get from [place]-- if surgeon wants him to have it early --- he will have to write it

## 2013-10-07 NOTE — Telephone Encounter (Signed)
Spoke with patient and and advised that our policy for any medication refill is 48 business hours. I informed the patient that script probably won't be ready until Monday for pick up. Patient reminded that Dr. Etter Sjogren is currently seeing patients and refill requests will be answered after all scheduled patients have been seen.

## 2013-10-07 NOTE — Telephone Encounter (Signed)
Patient aware Rx too soon and he voiced understanding, I made him aware he can find a pharmacy to fill the other script, he voiced understanding, I also advised he does not have to take as much and only use prn and he voiced understanding.Marland Kitchen      KP

## 2013-10-07 NOTE — Telephone Encounter (Signed)
To MD to review.     KP 

## 2013-10-07 NOTE — Telephone Encounter (Signed)
Patient called and stated that [facility] did not have any of the percocet 10/325 and he had the [facility] to [facility] to see if [facility] had the dose, I gave him [facility] and [place] to see if they have the dose for him. And he wanted to talk to a schedule on the cost of the surgery

## 2013-10-07 NOTE — Telephone Encounter (Signed)
Caller name:Chase [name]  Relation to LX:BWIO Call back number:[phone] Pharmacy:[id number] [place], [place]   Reason for call: pt states that he went to see Imogene Burn. Tsuei, MD and he gave pt a script for RX Hydrocodone-Acetaminophen 10-325 MG/15ML SOLN  But no pharmacies have the rx in stock.  Pt also has a script from Dr. Etter Sjogren for HYDROcodone-acetaminophen (HYCET) 7.5-325 mg/15 ml solution that he also almost out of.  Pt says he only really wants to get the RX from Dr. Etter Sjogren filled and not the one from Dr. Georgette Dover. Please advise

## 2013-10-10 ENCOUNTER — Telehealth (INDEPENDENT_AMBULATORY_CARE_PROVIDER_SITE_OTHER): Payer: Self-pay | Admitting: Surgery

## 2013-10-10 NOTE — Telephone Encounter (Signed)
Patient called back to go over cost of surgery for Dr. Georgette Dover, gave him his benefits, patient will call back to schedule.

## 2013-10-14 ENCOUNTER — Other Ambulatory Visit: Payer: Self-pay | Admitting: Family Medicine

## 2013-11-10 ENCOUNTER — Other Ambulatory Visit: Payer: Self-pay | Admitting: Family Medicine

## 2013-11-18 ENCOUNTER — Telehealth: Payer: Self-pay

## 2013-11-18 ENCOUNTER — Ambulatory Visit (INDEPENDENT_AMBULATORY_CARE_PROVIDER_SITE_OTHER): Payer: BC Managed Care – PPO | Admitting: Internal Medicine

## 2013-11-18 ENCOUNTER — Encounter: Payer: Self-pay | Admitting: Internal Medicine

## 2013-11-18 ENCOUNTER — Encounter: Payer: Self-pay | Admitting: Family Medicine

## 2013-11-18 ENCOUNTER — Telehealth: Payer: Self-pay | Admitting: Family Medicine

## 2013-11-18 VITALS — BP 146/93 | HR 89 | Temp 98.0°F | Wt 194.0 lb

## 2013-11-18 DIAGNOSIS — M545 Low back pain, unspecified: Secondary | ICD-10-CM

## 2013-11-18 DIAGNOSIS — M549 Dorsalgia, unspecified: Secondary | ICD-10-CM

## 2013-11-18 DIAGNOSIS — F411 Generalized anxiety disorder: Secondary | ICD-10-CM

## 2013-11-18 LAB — CBC WITH DIFFERENTIAL/PLATELET
Basophils Absolute: 0 10*3/uL (ref 0.0–0.1)
Basophils Relative: 0 % (ref 0–1)
Eosinophils Absolute: 0 10*3/uL (ref 0.0–0.7)
Eosinophils Relative: 0 % (ref 0–5)
HCT: 41.9 % (ref 39.0–52.0)
Hemoglobin: 14.9 g/dL (ref 13.0–17.0)
Lymphocytes Relative: 14 % (ref 12–46)
Lymphs Abs: 2 10*3/uL (ref 0.7–4.0)
MCH: 29.6 pg (ref 26.0–34.0)
MCHC: 35.6 g/dL (ref 30.0–36.0)
MCV: 83.3 fL (ref 78.0–100.0)
Monocytes Absolute: 1 10*3/uL (ref 0.1–1.0)
Monocytes Relative: 7 % (ref 3–12)
Neutro Abs: 11.4 10*3/uL — ABNORMAL HIGH (ref 1.7–7.7)
Neutrophils Relative %: 79 % — ABNORMAL HIGH (ref 43–77)
Platelets: 340 10*3/uL (ref 150–400)
RBC: 5.03 MIL/uL (ref 4.22–5.81)
RDW: 14.2 % (ref 11.5–15.5)
WBC: 14.4 10*3/uL — ABNORMAL HIGH (ref 4.0–10.5)

## 2013-11-18 LAB — BASIC METABOLIC PANEL
BUN: 18 mg/dL (ref 6–23)
CO2: 23 mEq/L (ref 19–32)
Calcium: 9.2 mg/dL (ref 8.4–10.5)
Chloride: 100 mEq/L (ref 96–112)
Creat: 1.46 mg/dL — ABNORMAL HIGH (ref 0.50–1.35)
Glucose, Bld: 113 mg/dL — ABNORMAL HIGH (ref 70–99)
Potassium: 3.6 mEq/L (ref 3.5–5.3)
Sodium: 137 mEq/L (ref 135–145)

## 2013-11-18 LAB — POCT URINALYSIS DIPSTICK
Bilirubin, UA: NEGATIVE
Glucose, UA: NEGATIVE
Ketones, UA: NEGATIVE
Leukocytes, UA: NEGATIVE
Nitrite, UA: NEGATIVE
Protein, UA: 30
Spec Grav, UA: <=1.005
Urobilinogen, UA: NEGATIVE
pH, UA: 6

## 2013-11-18 LAB — URINALYSIS, ROUTINE W REFLEX MICROSCOPIC
Bilirubin Urine: NEGATIVE
Ketones, ur: NEGATIVE
Leukocytes, UA: NEGATIVE
Nitrite: NEGATIVE
Specific Gravity, Urine: <=1.005 — AB (ref 1.000–1.030)
Total Protein, Urine: NEGATIVE
Urine Glucose: NEGATIVE
Urobilinogen, UA: 0.2 (ref 0.0–1.0)
pH: 6.5 (ref 5.0–8.0)

## 2013-11-18 MED ORDER — HYDROCODONE-ACETAMINOPHEN 7.5-325 MG/15ML PO SOLN: ORAL | Status: DC

## 2013-11-18 MED ORDER — CIPROFLOXACIN HCL 500 MG PO TABS: 500.0000 mg | ORAL_TABLET | Freq: Two times a day (BID) | ORAL | Status: DC

## 2013-11-18 MED ORDER — DIAZEPAM 5 MG PO TABS: ORAL_TABLET | ORAL | Status: DC

## 2013-11-18 NOTE — Patient Instructions (Signed)
Get your blood work before you leave   We are ordering a CT of the abdomen and pelvis  If you've worse, go to the ER any time  You are getting a prescription for Cipro, and  antibiotic, please don't take until you hear from [place]

## 2013-11-18 NOTE — Progress Notes (Signed)
Subjective:    Patient ID: Chase [name], male    DOB: 08/03/1960, 53 y.o.   MRN: [id number]  DOS:  11/18/2013 Type of  Visit: acute  History: Sx started last night at 7 pm: Low back pain was initially right-sided then bilateral , shortly after it started to radiate to the lower abdomen, both sides. Took hydrocodone -->  did help a little. Pain has been as high as 9/10. The back pain is steady, not particularly worse when he mpves his torso, no radiation to the leg. The stomach discomfort is on and off. He has a history of kidney stones, no previous appendectomy.    ROS Fevers? "Im not sure", no chills. + Nausea, no vomiting. Had a normal bowel movement today, no watery diarrhea or blood in the stools. No rash in the back that he can tell. Had mild dysuria, urine color was darker than usual. No gross hematuria, mild difficulty urinating. Denies cough , no difficulty breathing   Past Medical History  Diagnosis Date  . Allergy   . Stroke   . Sleep apnea   . Headache(784.0)   . Arthritis   . GERD (gastroesophageal reflux disease)   . Hypertension   . Hyperlipidemia   . Hearing loss   . Nasal congestion   . Trouble swallowing   . Blood in stool   . Chest pain   . Leg swelling   . Constipation   . Rectal pain   . Difficulty urinating   . Thrombosed hemorrhoids   . Brain cyst   . PONV (postoperative nausea and vomiting)   . Diabetes mellitus without complication     Past Surgical History  Procedure Laterality Date  . Cataract extraction      x 3  . Inner ear surgery      rt ear  . Eye surgery  1968, 1985, 1987    cataracts  . Inner ear surgery  1992  . Surgery - right arm      1983    History   Social History  . Marital Status: Single    Spouse Name: N/A    Number of Children: N/A  . Years of Education: N/A   Occupational History  . Not on file.   Social History Main Topics  . Smoking status: Never Smoker   . Smokeless tobacco: Never Used  .  Alcohol Use: No     Comment: none  . Drug Use: No  . Sexual Activity: No   Other Topics Concern  . Not on file   Social History Narrative  . No narrative on file        Medication List       This list is accurate as of: 11/18/13  2:50 PM.  Always use your most recent med list.               allopurinol 100 MG tablet  Commonly known as:  ZYLOPRIM  TAKE 1 TABLET BY MOUTH DAILY.     atorvastatin 20 MG tablet  Commonly known as:  LIPITOR  Take 1 tablet (20 mg total) by mouth daily.     azelastine 0.1 % nasal spray  Commonly known as:  ASTELIN  PLACE 2 SPRAYS INTO THE NOSE TWICE A DAY. PUT IN EACH NOSTRIL AS DIRECTED     cetirizine 10 MG tablet  Commonly known as:  ZYRTEC  Take 1 tablet (10 mg total) by mouth daily.     diazepam 5 MG  tablet  Commonly known as:  VALIUM  TAKE 1 TABLET BY MOUTH 3 TIMES A DAY     FIBER SELECT GUMMIES PO  Take 1 tablet by mouth daily.     fluticasone 50 MCG/ACT nasal spray  Commonly known as:  FLONASE  PLACE 2 SPRAYS INTO THE NOSE DAILY.     freestyle lancets  Use as instructed test 4 (four) times daily. Dx. 250.00     glucose blood test strip  Commonly known as:  FREESTYLE INSULINX TEST  Use as instructed test 4 (four) times daily. Dx. 250.00     BAYER CONTOUR NEXT TEST test strip  Generic drug:  glucose blood  1 each by Other route as needed for other. Use as instructed     HYDROcodone-acetaminophen 7.5-325 mg/15 ml solution  Commonly known as:  HYCET  TAKE 15 ML EVERY 6 HOURS AS NEEDED FOR PAIN     hydrocortisone 25 MG suppository  Commonly known as:  ANUSOL-HC  PLACE 1 SUPPOSITORY INTO RECTUM TWICE A DAY     hydrocortisone-pramoxine 2.5-1 % rectal cream  Commonly known as:  ANALPRAM-HC  PLACE 1 APPLICATION RECTALLY 2 (TWO) TIMES DAILY AS NEEDED. FOR HEMORROIDS.     hydrOXYzine 25 MG tablet  Commonly known as:  ATARAX/VISTARIL  TAKE 1 TABLET AT BEDTIME     ibuprofen 100 MG/5ML suspension  Commonly known as:   ADVIL,MOTRIN  Take 200 mg by mouth every 4 (four) hours as needed for fever.     meclizine 25 MG tablet  Commonly known as:  ANTIVERT  Take 1 tablet (25 mg total) by mouth 3 (three) times daily as needed.     metFORMIN 500 MG tablet  Commonly known as:  GLUCOPHAGE  Take 500 mg by mouth daily as needed.     metoprolol tartrate 25 MG tablet  Commonly known as:  LOPRESSOR  TAKE 1 TABLET TWICE A DAY, MAY CRUSH IN FOOD     MUCINEX FAST-MAX CONGEST COUGH 2.5-5-100 MG/5ML Liqd  Generic drug:  Phenylephrine-DM-GG  Take 20 mLs by mouth as needed.     multivitamin tablet  Take 1 tablet by mouth daily. GummyMultivitamin     predniSONE 10 MG tablet  Commonly known as:  DELTASONE  3 po qd for 3 days then 2 po qd for 3 days the 1 po qd for 3 days     promethazine 25 MG suppository  Commonly known as:  PHENERGAN  Place 1 suppository (25 mg total) rectally 2 (two) times daily as needed for nausea.     ranitidine 150 MG/10ML syrup  Commonly known as:  ZANTAC  Take 150 mg by mouth 2 (two) times daily as needed. For indigestion.     venlafaxine XR 37.5 MG 24 hr capsule  Commonly known as:  EFFEXOR-XR  TAKE 1 CAPSULE BY MOUTH DAILY.           Objective:   Physical Exam BP 146/93  Pulse 89  Temp(Src) 98 F (36.7 C)  Wt 194 lb (87.998 kg)  SpO2 97% General -- alert, well-developed, NAD. Not pale  Lungs -- normal respiratory effort, no intercostal retractions, no accessory muscle use, and normal breath sounds.  Heart-- normal rate, regular rhythm, no murmur.  Abdomen-- Not distended, good bowel sounds,soft, slightly tender at the abdomen without mass or rebound. Question of CVA tenderness   bilateral.  Extremities-- no pretibial edema bilaterally  Neurologic--  alert & oriented X3. Speech normal, gait appropriate for age, strength symmetric and appropriate  for age.  No antalgic posture or gait  Psych-- Cognition and judgment appear intact. Cooperative with normal attention span and  concentration. No anxious or depressed appearing.     Assessment & Plan:   Back and abdominal pain for one day. The patient has a history of urolithiasis, Udip showed blood Although the etiology is most likely related to the GU tract (UTI) , his symptoms would be somehow atypical for kidney stones as the pain is bilateral.  Plan: BMP, CBC, urine culture We'll get a CT abdomen to rule out urolithiasis and other etiologies such as atypical presentation of diverticulitis or appendicitis  Addendum, CT abd/pelvis.  3 mm r stone, hydroureter, hydronephrosis, pyelonephritis. (Report not in the computer, must have been an outside study) WBC elevated, creatinine slightly elevated, patient started on Cipro. Will check on him monday

## 2013-11-18 NOTE — Progress Notes (Signed)
Pre visit review using our clinic review tool, if applicable. No additional management support is needed unless otherwise documented below in the visit note. 

## 2013-11-18 NOTE — Telephone Encounter (Signed)
CT abd/pelvis.  3 mm r stone, hydroureter, hydronephrosis, pyelonephritis.  Mild elev WBC, R shift.  Cr 1.45  A/P: Nephrolithiasis, Pyelo, hydronephrosis, mild elec Cr. Cipro, pain meds, fluids. To ER if uncontrolled pain.   Further dispo Monday if has not passed stone. Defer to Dr. Larose Kells or Etter Sjogren.  Electronically Signed  By: Owens Loffler, MD On: 11/18/2013 6:10 PM   Results for orders placed in visit on 11/18/13  URINALYSIS, ROUTINE W REFLEX MICROSCOPIC      Result Value Ref Range   Color, Urine YELLOW  Yellow;Lt. Yellow   APPearance CLEAR  Clear   Specific Gravity, Urine <=1.005 (*) 1.000 - 1.030   pH 6.5  5.0 - 8.0   Total Protein, Urine NEGATIVE  Negative   Urine Glucose NEGATIVE  Negative   Ketones, ur NEGATIVE  Negative   Bilirubin Urine NEGATIVE  Negative   Hgb urine dipstick LARGE (*) Negative   Urobilinogen, UA 0.2  0.0 - 1.0   Leukocytes, UA NEGATIVE  Negative   Nitrite NEGATIVE  Negative   WBC, UA 0-2/hpf  0-2/hpf   RBC / HPF 0-2/hpf  0-2/hpf  CBC WITH DIFFERENTIAL      Result Value Ref Range   WBC 14.4 (*) 4.0 - 10.5 K/uL   RBC 5.03  4.22 - 5.81 MIL/uL   Hemoglobin 14.9  13.0 - 17.0 g/dL   HCT 41.9  39.0 - 52.0 %   MCV 83.3  78.0 - 100.0 fL   MCH 29.6  26.0 - 34.0 pg   MCHC 35.6  30.0 - 36.0 g/dL   RDW 14.2  11.5 - 15.5 %   Platelets 340  150 - 400 K/uL   Neutrophils Relative % 79 (*) 43 - 77 %   Neutro Abs 11.4 (*) 1.7 - 7.7 K/uL   Lymphocytes Relative 14  12 - 46 %   Lymphs Abs 2.0  0.7 - 4.0 K/uL   Monocytes Relative 7  3 - 12 %   Monocytes Absolute 1.0  0.1 - 1.0 K/uL   Eosinophils Relative 0  0 - 5 %   Eosinophils Absolute 0.0  0.0 - 0.7 K/uL   Basophils Relative 0  0 - 1 %   Basophils Absolute 0.0  0.0 - 0.1 K/uL   Smear Review Criteria for review not met    BASIC METABOLIC PANEL      Result Value Ref Range   Sodium 137  135 - 145 mEq/L   Potassium 3.6  3.5 - 5.3 mEq/L   Chloride 100  96 - 112 mEq/L   CO2 23  19 - 32 mEq/L   Glucose, Bld 113  (*) 70 - 99 mg/dL   BUN 18  6 - 23 mg/dL   Creat 1.46 (*) 0.50 - 1.35 mg/dL   Calcium 9.2  8.4 - 10.5 mg/dL  POCT URINALYSIS DIPSTICK      Result Value Ref Range   Color, UA yellow     Clarity, UA clear     Glucose, UA neg     Bilirubin, UA neg     Ketones, UA neg     Spec Grav, UA <=1.005     Blood, UA large     pH, UA 6.0     Protein, UA 30     Urobilinogen, UA negative     Nitrite, UA neg     Leukocytes, UA Negative

## 2013-11-18 NOTE — Telephone Encounter (Signed)
Noted  

## 2013-11-18 NOTE — Telephone Encounter (Signed)
Last seen 09/23/13 and diazepam filled 06/01/13 #90 with 1 refill. Hycet filled 09/23/13 #120 ml. Please advise     KP

## 2013-11-18 NOTE — Telephone Encounter (Signed)
----   Message from [name] to Rosalita Chessman, DO sent at 11/18/2013 2:12 AM ----- need my Diazepam refilled please. I also need the Hycet refilled please. I fill like my body is going to pass more Kidney stones or I have horrible intestinal tract gas backed up. I wake up in the mornings sometimes with my Lower Back hurting until I urinate, but tonight, I am hurting constantly with only getting very little relief when I pee. I don't think I have had a bowel movement in 24hrs-   Pt sent a mychart message to Dr. Etter Sjogren at 2:21 am this morning.  Pt was called to be triaged based on his symptoms.    C/o:  Lower back pain, primarily on the right side and abdominal pressure and distention.  Describes pain as a dull ache that hurts from the back to the front.  Rated 4/10, however last night pain was 9/10.  Last night he was very uncomfortable.  Unable to sleep.  Tossed and turned. Stated that laying on his right side helps some.  He also reported a small BM this morning that has also helped some.  VS this morning:  BP- 171/104, HR- 76, BS- 159 fasting.    Associated symptoms:  Nausea without vomiting last night and dry mouth this morning  Onset of symptoms:  Yesterday evening around 6 or 7 pm.    Self treated:  Hycet---not very therapeutic  Hx: passing multiple kidney stones in the past.    Advice given: To be seen in the office today. Dr. Etter Sjogren is out of the office today.  Appointment scheduled with Dr. Larose Kells today at 2:15 pm.

## 2013-11-20 ENCOUNTER — Telehealth: Payer: Self-pay | Admitting: Internal Medicine

## 2013-11-20 DIAGNOSIS — N1330 Unspecified hydronephrosis: Secondary | ICD-10-CM

## 2013-11-20 LAB — URINE CULTURE
Colony Count: NO GROWTH
Organism ID, Bacteria: NO GROWTH

## 2013-11-20 NOTE — Telephone Encounter (Signed)
Dx w/  pyelonephritis and kidney stone. Plan: Get report to the CT Refer to urology Schedule a visit with PCP next week Advise the patient ER if persistent pain, fever, chills or not getting  better

## 2013-11-21 NOTE — Telephone Encounter (Signed)
Will scan CT report, "Moderate right-sided hydronephrosis proximal hydroureter due to to a 3 mm need urethral stone. Right kidney and weight changes that may reflect pyelonephritis" Urine culture negative but we'll continue with antibiotics d/t CT findings

## 2013-11-21 NOTE — Telephone Encounter (Signed)
Spoke with pt and advised of CT results. Patient also made aware that we have placed a referral to urology for further evaluation. Patient states "he passed the stone last night" and that he "wrapped it up in a paper towel and is carrying it in his pocket." I advised the patient to put in something more secure to reduce the risk of losing it.

## 2013-11-21 NOTE — Telephone Encounter (Signed)
Results of CT received via fax and placed in your folder for review.

## 2013-11-25 ENCOUNTER — Encounter: Payer: Self-pay | Admitting: Internal Medicine

## 2013-12-01 ENCOUNTER — Encounter: Payer: Self-pay | Admitting: Internal Medicine

## 2013-12-06 DIAGNOSIS — N411 Chronic prostatitis: Secondary | ICD-10-CM

## 2013-12-06 HISTORY — DX: Chronic prostatitis: N41.1

## 2013-12-10 ENCOUNTER — Other Ambulatory Visit: Payer: Self-pay | Admitting: Family Medicine

## 2013-12-26 ENCOUNTER — Other Ambulatory Visit: Payer: Self-pay | Admitting: Family Medicine

## 2013-12-27 NOTE — Telephone Encounter (Signed)
Last seen 09/23/13 and filled 11/18/13 #30 UDS 09/02/12 low risk.  Please advise     KP

## 2014-01-18 ENCOUNTER — Other Ambulatory Visit: Payer: Self-pay | Admitting: Family Medicine

## 2014-01-18 ENCOUNTER — Other Ambulatory Visit: Payer: Self-pay | Admitting: Internal Medicine

## 2014-01-18 NOTE — Telephone Encounter (Signed)
Last seen 09/23/13 and filled 11/18/13 120 ml. UDS 09/02/12   Please advise    KP

## 2014-01-18 NOTE — Telephone Encounter (Signed)
Last seen 09/23/13 and filled 12/27/13 #30  Please advise     KP

## 2014-01-19 MED ORDER — HYDROCODONE-ACETAMINOPHEN 7.5-325 MG/15ML PO SOLN: ORAL | Status: DC

## 2014-02-02 ENCOUNTER — Encounter: Payer: Self-pay | Admitting: Medical

## 2014-02-02 ENCOUNTER — Telehealth: Payer: Self-pay | Admitting: Internal Medicine

## 2014-02-02 ENCOUNTER — Ambulatory Visit (INDEPENDENT_AMBULATORY_CARE_PROVIDER_SITE_OTHER): Payer: BC Managed Care – PPO | Admitting: Medical

## 2014-02-02 VITALS — BP 148/82 | HR 74 | Temp 98.2°F | Wt 192.0 lb

## 2014-02-02 DIAGNOSIS — J0100 Acute maxillary sinusitis, unspecified: Secondary | ICD-10-CM

## 2014-02-02 DIAGNOSIS — J309 Allergic rhinitis, unspecified: Secondary | ICD-10-CM

## 2014-02-02 HISTORY — DX: Acute maxillary sinusitis, unspecified: J01.00

## 2014-02-02 MED ORDER — AZITHROMYCIN 250 MG PO TABS: ORAL_TABLET | ORAL | Status: DC

## 2014-02-02 NOTE — Telephone Encounter (Signed)
rx sent  Pt notified. 

## 2014-02-02 NOTE — Progress Notes (Signed)
Subjective:    Patient ID: Chase [name], male    DOB: 1960/08/19, 53 y.o.   MRN: [id number]  HPI  Pt in stating does not feel well. He feels frontal and maxillary sinus pressure. Nasal congestion, sneezing and itching eyes. Upper teeth rt side mild sensative. Both ears feel pressure. These sympotoms going on 2 weeks or more. Occasional sweats. But no reported fever or chills. History of allergic rhinitis. Pt just restarted nasocort last night. Hx of ear infections even as an adult.   Past Medical History  Diagnosis Date  . Allergy   . Stroke   . Sleep apnea   . Headache(784.0)   . Arthritis   . GERD (gastroesophageal reflux disease)   . Hypertension   . Hyperlipidemia   . Hearing loss   . Nasal congestion   . Trouble swallowing   . Blood in stool   . Chest pain   . Leg swelling   . Constipation   . Rectal pain   . Difficulty urinating   . Thrombosed hemorrhoids   . Brain cyst   . PONV (postoperative nausea and vomiting)   . Diabetes mellitus without complication     History   Social History  . Marital Status: Single    Spouse Name: N/A    Number of Children: N/A  . Years of Education: N/A   Occupational History  . Not on file.   Social History Main Topics  . Smoking status: Never Smoker   . Smokeless tobacco: Never Used  . Alcohol Use: No     Comment: none  . Drug Use: No  . Sexual Activity: No   Other Topics Concern  . Not on file   Social History Narrative  . No narrative on file    Past Surgical History  Procedure Laterality Date  . Cataract extraction      x 3  . Inner ear surgery      rt ear  . Eye surgery  1968, 1985, 1987    cataracts  . Inner ear surgery  1992  . Surgery - right arm      1983    Family History  Problem Relation Age of Onset  . Cancer Mother 10    breast  . Stroke Father   . Heart disease Father   . Hypertension Father   . Heart attack Father   . Hypertension Brother   . Hyperlipidemia Neg Hx   . Diabetes  Neg Hx     Allergies  Allergen Reactions  . Amoxicillin Hives, Itching and Other (See Comments)    White tongue; ? hives  . Terfenadine     ? reaction    Current Outpatient Prescriptions on File Prior to Visit  Medication Sig Dispense Refill  . allopurinol (ZYLOPRIM) 100 MG tablet TAKE 1 TABLET BY MOUTH DAILY.  90 tablet  1  . atorvastatin (LIPITOR) 20 MG tablet Take 1 tablet (20 mg total) by mouth daily.  30 tablet  2  . azelastine (ASTELIN) 137 MCG/SPRAY nasal spray PLACE 2 SPRAYS INTO THE NOSE TWICE A DAY. PUT IN EACH NOSTRIL AS DIRECTED  30 mL  1  . cetirizine (ZYRTEC) 10 MG tablet Take 1 tablet (10 mg total) by mouth daily.  30 tablet  11  . ciprofloxacin (CIPRO) 500 MG tablet Take 1 tablet (500 mg total) by mouth 2 (two) times daily.  20 tablet  0  . diazepam (VALIUM) 5 MG tablet TAKE 1 TABLET BY  MOUTH 3 TIMES A DAY  30 tablet  0  . FIBER SELECT GUMMIES PO Take 1 tablet by mouth daily.      Marland Kitchen glucose blood (BAYER CONTOUR NEXT TEST) test strip 1 each by Other route as needed for other. Use as instructed      . glucose blood (FREESTYLE INSULINX TEST) test strip Use as instructed test 4 (four) times daily. Dx. 250.00  100 each  1  . hydrocortisone-pramoxine (ANALPRAM-HC) 2.5-1 % rectal cream PLACE 1 APPLICATION RECTALLY 2 (TWO) TIMES DAILY AS NEEDED. FOR HEMORROIDS.  30 g  2  . hydrOXYzine (ATARAX/VISTARIL) 25 MG tablet TAKE 1 TABLET AT BEDTIME  30 tablet  5  . ibuprofen (ADVIL,MOTRIN) 100 MG/5ML suspension Take 200 mg by mouth every 4 (four) hours as needed for fever.      . Lancets (FREESTYLE) lancets Use as instructed test 4 (four) times daily. Dx. 250.00  100 each  1  . metFORMIN (GLUCOPHAGE) 500 MG tablet Take 500 mg by mouth daily as needed.      . metoprolol tartrate (LOPRESSOR) 25 MG tablet TAKE 1 TABLET TWICE A DAY, MAY CRUSH IN FOOD  180 tablet  1  . Phenylephrine-DM-GG (MUCINEX FAST-MAX CONGEST COUGH) 2.5-5-100 MG/5ML LIQD Take 20 mLs by mouth as needed.      . venlafaxine  XR (EFFEXOR-XR) 37.5 MG 24 hr capsule TAKE 1 CAPSULE BY MOUTH DAILY.  30 capsule  5  . HYDROcodone-acetaminophen (HYCET) 7.5-325 mg/15 ml solution TAKE 15 ML EVERY 6 HOURS AS NEEDED FOR PAIN  120 mL  0  . hydrocortisone (ANUSOL-HC) 25 MG suppository PLACE 1 SUPPOSITORY INTO RECTUM TWICE A DAY  12 suppository  2  . meclizine (ANTIVERT) 25 MG tablet Take 1 tablet (25 mg total) by mouth 3 (three) times daily as needed.  60 tablet  0  . Multiple Vitamin (MULTIVITAMIN) tablet Take 1 tablet by mouth daily. GummyMultivitamin      . predniSONE (DELTASONE) 10 MG tablet 3 po qd for 3 days then 2 po qd for 3 days the 1 po qd for 3 days  18 tablet  0  . promethazine (PHENERGAN) 25 MG suppository Place 1 suppository (25 mg total) rectally 2 (two) times daily as needed for nausea.  30 each  0  . ranitidine (ZANTAC) 150 MG/10ML syrup Take 150 mg by mouth 2 (two) times daily as needed. For indigestion.       No current facility-administered medications on file prior to visit.    BP 148/82  Pulse 74  Temp(Src) 98.2 F (36.8 C)  Wt 192 lb (87.091 kg)  SpO2 99%     Review of Systems  Constitutional: Negative for fever, chills and fatigue.  HENT: Positive for congestion, postnasal drip, rhinorrhea and sinus pressure. Negative for drooling, ear discharge, ear pain, nosebleeds, sore throat and trouble swallowing.   Eyes: Positive for itching. Negative for pain, redness and visual disturbance.  Respiratory: Negative for cough, choking, chest tightness and wheezing.   Cardiovascular: Negative for chest pain and palpitations.  Musculoskeletal: Negative.   Skin: Negative for pallor and rash.  Neurological: Negative.        No ha reported to me. Reported sinus pressure.  Hematological: Negative for adenopathy. Does not bruise/bleed easily.       Objective:   Physical Exam  General  Mental Status - Alert. General Appearance - Well groomed. Not in acute distress.  Skin Rashes- No  Rashes.  HEENT Head- Normal. Ear Auditory Canal -  Left- Normal. Right - Normal.Tympanic Membrane- Left- Normal. Right- Normal. Eye Sclera/Conjunctiva- Left- Normal. Right- Normal. Nose & Sinuses Nasal Mucosa- Left-  Boggy + Congested. Right-  Boggy + Congested. Maxillary sinus pressure to palpation. Mouth & Throat Lips: Upper Lip- Normal: no dryness, cracking, pallor, cyanosis, or vesicular eruption. Lower Lip-Normal: no dryness, cracking, pallor, cyanosis or vesicular eruption. Buccal Mucosa- Bilateral- No Aphthous ulcers. Oropharynx- No Discharge or Erythema. Tonsils: Characteristics- Bilateral- No Erythema or Congestion. pnd. Size/Enlargement- Bilateral- No enlargement. Discharge- bilateral-None.  Neck Neck- Supple. No Masses.   Chest and Lung Exam Auscultation: Breath Sounds:-Normal. Clear, even and unlabored.  Cardiovascular Auscultation:Rythm- Regular, rate, rhythm. Murmurs & Other Heart Sounds:Ausculatation of the heart reveal- No Murmurs.  Lymphatic Head & Neck General Head & Neck Lymphatics: Bilateral: Description- No Localized lymphadenopathy.         Assessment & Plan:

## 2014-02-02 NOTE — Patient Instructions (Addendum)
You appear to have allergic rhinitis exacerbation recently with subsequent sinusitis. Continue with nasocort nasal spray and get allegra otc. Do not use any d products as your blood pressure is mild elevated.(Please stop your mucinex with phenylephrine as well) Also I am prescribing antibiotic for sinusitis. Follow up in 7 days persisting symptoms or as needed worsening or changing symptoms.

## 2014-02-02 NOTE — Progress Notes (Signed)
Pre visit review using our clinic review tool, if applicable. No additional management support is needed unless otherwise documented below in the visit note. 

## 2014-02-02 NOTE — Assessment & Plan Note (Signed)
Continue nasocort otc. Add plain allegra. I discouraged decongestants otc due to elevation of bp effect.

## 2014-02-02 NOTE — Assessment & Plan Note (Signed)
Rx of azithromomycin. Pt has allergy to pcn(reported as high level on review).

## 2014-02-02 NOTE — Telephone Encounter (Signed)
Caller name:  Ademide  Relation to pt: self  Call back number: [phone] Pharmacy: [facility] [phone]   Reason for call: pt was seen in office today does not remember the name of the new script you were prescribing. Pharmacy has not received rx pt would like to be notified when script is sent over. Script was for sinus issues.

## 2014-02-04 ENCOUNTER — Telehealth: Payer: Self-pay

## 2014-02-04 NOTE — Telephone Encounter (Signed)
Was speaking to pt and then pt hung up. However, did state that he was seeing Dr. Cruzita Lederer. Did not answer at my next attempt.   Letter being sent to address on file.   Diabetic bundle pt.

## 2014-02-12 ENCOUNTER — Encounter: Payer: Self-pay | Admitting: Family Medicine

## 2014-02-12 ENCOUNTER — Other Ambulatory Visit: Payer: Self-pay | Admitting: Family Medicine

## 2014-02-13 ENCOUNTER — Encounter: Payer: Self-pay | Admitting: Family Medicine

## 2014-02-13 MED ORDER — DIAZEPAM 5 MG PO TABS: ORAL_TABLET | ORAL | Status: DC

## 2014-02-13 MED ORDER — HYDROCODONE-ACETAMINOPHEN 7.5-325 MG/15ML PO SOLN: ORAL | Status: DC

## 2014-02-13 NOTE — Telephone Encounter (Signed)
Rx faxed.    KP 

## 2014-02-13 NOTE — Telephone Encounter (Signed)
Last seen 09/23/13 and filled 01/19/14 #120 ml. Please advise     KP

## 2014-02-13 NOTE — Addendum Note (Signed)
Addended by: Ewing Schlein on: 02/13/2014 01:53 PM   Modules accepted: Orders

## 2014-02-13 NOTE — Telephone Encounter (Signed)
If it was #90 in the past it can be increased

## 2014-02-20 ENCOUNTER — Other Ambulatory Visit: Payer: Self-pay | Admitting: Family Medicine

## 2014-03-24 ENCOUNTER — Other Ambulatory Visit: Payer: Self-pay | Admitting: Family Medicine

## 2014-03-24 NOTE — Telephone Encounter (Signed)
Last seen 09/23/13 and filled 02/13/14 #142ml UDS 09/02/12  Please advise   KP

## 2014-03-28 ENCOUNTER — Other Ambulatory Visit: Payer: Self-pay

## 2014-03-28 MED ORDER — METOPROLOL TARTRATE 25 MG PO TABS: ORAL_TABLET | ORAL | Status: DC

## 2014-03-31 ENCOUNTER — Other Ambulatory Visit: Payer: Self-pay

## 2014-04-20 ENCOUNTER — Other Ambulatory Visit: Payer: Self-pay | Admitting: Family Medicine

## 2014-05-26 ENCOUNTER — Ambulatory Visit (INDEPENDENT_AMBULATORY_CARE_PROVIDER_SITE_OTHER): Payer: BC Managed Care – PPO | Admitting: Internal Medicine

## 2014-05-26 ENCOUNTER — Other Ambulatory Visit: Payer: Self-pay | Admitting: Family Medicine

## 2014-05-26 ENCOUNTER — Other Ambulatory Visit (INDEPENDENT_AMBULATORY_CARE_PROVIDER_SITE_OTHER): Payer: BC Managed Care – PPO

## 2014-05-26 ENCOUNTER — Encounter: Payer: Self-pay | Admitting: Internal Medicine

## 2014-05-26 VITALS — BP 138/110 | HR 83 | Temp 98.1°F | Wt 193.2 lb

## 2014-05-26 DIAGNOSIS — IMO0001 Reserved for inherently not codable concepts without codable children: Secondary | ICD-10-CM

## 2014-05-26 DIAGNOSIS — E119 Type 2 diabetes mellitus without complications: Secondary | ICD-10-CM

## 2014-05-26 DIAGNOSIS — R030 Elevated blood-pressure reading, without diagnosis of hypertension: Secondary | ICD-10-CM

## 2014-05-26 DIAGNOSIS — L259 Unspecified contact dermatitis, unspecified cause: Secondary | ICD-10-CM

## 2014-05-26 LAB — HEMOGLOBIN A1C: Hgb A1c MFr Bld: 6.6 % — ABNORMAL HIGH (ref 4.6–6.5)

## 2014-05-26 MED ORDER — HYDROXYZINE HCL 10 MG PO TABS: 10.0000 mg | ORAL_TABLET | Freq: Three times a day (TID) | ORAL | Status: DC | PRN

## 2014-05-26 MED ORDER — MOMETASONE FUROATE 0.1 % EX OINT: TOPICAL_OINTMENT | Freq: Two times a day (BID) | CUTANEOUS | Status: DC

## 2014-05-26 MED ORDER — PREDNISONE 20 MG PO TABS: 20.0000 mg | ORAL_TABLET | Freq: Two times a day (BID) | ORAL | Status: DC

## 2014-05-26 MED ORDER — GLUCOSE BLOOD VI STRP: ORAL_STRIP | Status: DC

## 2014-05-26 NOTE — Telephone Encounter (Signed)
Last seen 09/23/13 and filled 02/13/14 #90.   Please advise      KP

## 2014-05-26 NOTE — Patient Instructions (Addendum)
Your next office appointment will be determined based upon review of your pending labs. Those instructions will be transmitted to you through My Chart   Followup as needed for your acute issue. Please report any significant change in your symptoms. Fill the  prescription for prednisone it there is not dramatic improvement in the next 72 hours.  Please take the blood pressure medicine twice a day connsistently to prevent blood pressure rises.  Minimal Blood Pressure Goal= AVERAGE < 140/90;  Ideal is an AVERAGE < 135/85. This AVERAGE should be calculated from @ least 5-7 BP readings taken @ different times of day on different days of week. You should not respond to isolated BP readings , but rather the AVERAGE for that week .Please bring your  blood pressure cuff to office visits to verify that it is reliable.It  can also be checked against the blood pressure device at the pharmacy. Finger or wrist cuffs are not dependable; an arm cuff is.

## 2014-05-26 NOTE — Progress Notes (Signed)
Pre visit review using our clinic review tool, if applicable. No additional management support is needed unless otherwise documented below in the visit note. 

## 2014-05-26 NOTE — Progress Notes (Signed)
   Subjective:    Patient ID: Chase [name], male    DOB: 05-Oct-1960, 53 y.o.   MRN: [id number]  HPI   He's had a rash over the left forearm since 12/8. He thought it was related to his watch and switched the watch to his right wrist. There was no rash subsequently on the right.  He's used [employer] & triple antibiotic ointment without response.   The lesion continues to itch and be tender.  He has had some sneezing and itchy watery eyes.  He feels he may have some shortness of breath and questionable wheezing  Review of Systems  He denies angioedema He's had no associated diarrhea   He is monitoring his blood pressure some ,but he did not bring his recordings. He states he typically will miss one of the 2 doses of metoprolol most days, usually the am dose.  His glucose strips have expired so he does not know what his glucoses are running. His last A1c was 6.4% in December of last year       Objective:   Physical Exam   Positive or pertinent findings include: His responses are slow and methodical. He states this is because he's had mini strokes. 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 He has an 8 x 7 erythematous rectangular shaped dermatitis over the left ventral forearm which blanches with pressure. There is a central 8 x 7 mm plaque-like  erythematous lesion. No purulence or cellulitis suggested.  General appearance :adequately nourished; in no distress. Eyes: No conjunctival inflammation or scleral icterus is present. Oral exam: Dental hygiene is good. Lips and gums are healthy appearing.There is no oropharyngeal erythema or exudate noted.  Heart:  Normal rate and regular rhythm. S1 and S2 normal without gallop, murmur, click, rub or other extra sounds   Lungs:Chest clear to auscultation; no wheezes, rhonchi,rales ,or rubs present.No increased work of  breathing.  Vascular : all pulses equal ; no bruits present. Skin:Warm & dry otherwise.  No jaundice or tenting Lymphatic: No lymphadenopathy is noted about the head, neck, axilla, or epitrochlear areas.             Assessment & Plan:  #1 contact dermatitis  #2 diabetes, questionable control  #3 HTN; suboptimal medication compliance  Plan: see orders and recommendations

## 2014-06-05 ENCOUNTER — Other Ambulatory Visit: Payer: Self-pay | Admitting: Family Medicine

## 2014-06-05 ENCOUNTER — Encounter: Payer: Self-pay | Admitting: Family Medicine

## 2014-06-05 DIAGNOSIS — L259 Unspecified contact dermatitis, unspecified cause: Secondary | ICD-10-CM

## 2014-06-05 MED ORDER — HYDROXYZINE HCL 10 MG PO TABS: 10.0000 mg | ORAL_TABLET | Freq: Three times a day (TID) | ORAL | Status: DC | PRN

## 2014-06-05 MED ORDER — DIAZEPAM 5 MG PO TABS: 5.0000 mg | ORAL_TABLET | Freq: Three times a day (TID) | ORAL | Status: DC

## 2014-06-05 NOTE — Addendum Note (Signed)
Addended by: Ewing Schlein on: 06/05/2014 04:49 PM   Modules accepted: Orders

## 2014-06-05 NOTE — Telephone Encounter (Signed)
Ok to fill both x1 Can put 2 refills on hydroxyzine

## 2014-06-06 ENCOUNTER — Other Ambulatory Visit: Payer: Self-pay

## 2014-06-06 DIAGNOSIS — J302 Other seasonal allergic rhinitis: Secondary | ICD-10-CM

## 2014-06-06 MED ORDER — CETIRIZINE HCL 10 MG PO TABS: 10.0000 mg | ORAL_TABLET | Freq: Every day | ORAL | Status: DC

## 2014-06-22 ENCOUNTER — Other Ambulatory Visit: Payer: Self-pay | Admitting: Family Medicine

## 2014-06-30 ENCOUNTER — Ambulatory Visit (INDEPENDENT_AMBULATORY_CARE_PROVIDER_SITE_OTHER): Payer: BLUE CROSS/BLUE SHIELD | Admitting: Medical

## 2014-06-30 ENCOUNTER — Telehealth: Payer: Self-pay | Admitting: Internal Medicine

## 2014-06-30 ENCOUNTER — Ambulatory Visit: Payer: Self-pay | Admitting: Internal Medicine

## 2014-06-30 DIAGNOSIS — J329 Chronic sinusitis, unspecified: Secondary | ICD-10-CM

## 2014-06-30 DIAGNOSIS — J0100 Acute maxillary sinusitis, unspecified: Secondary | ICD-10-CM

## 2014-06-30 DIAGNOSIS — I10 Essential (primary) hypertension: Secondary | ICD-10-CM

## 2014-06-30 HISTORY — DX: Chronic sinusitis, unspecified: J32.9

## 2014-06-30 MED ORDER — HYDROCODONE-HOMATROPINE 5-1.5 MG/5ML PO SYRP: 5.0000 mL | ORAL_SOLUTION | Freq: Three times a day (TID) | ORAL | Status: DC | PRN

## 2014-06-30 MED ORDER — AZITHROMYCIN 200 MG/5ML PO SUSR: ORAL | Status: DC

## 2014-06-30 NOTE — Telephone Encounter (Signed)
Patient Name: Chase [name]  DOB: 1961-06-12    Nurse Assessment  Nurse: Mallie Mussel, RN, Alveta Heimlich Date/Time (Eastern Time): 06/30/2014 8:23:59 AM  Confirm and document reason for call. If symptomatic, describe symptoms. ---Caller states that he has a severe headache and his BP has been elevated. 166/102 at 6:15AM Last night, it was 200/180. He rates his headache which began yesterday and he rates his pain as 9 on 0-10 scale. This is not the worst headache of his life. He has sinus pressure and pain this morning. He took a generic of Mucinex DM.  Has the patient traveled out of the country within the last 30 days? ---No  Does the patient require triage? ---Yes  Related visit to physician within the last 2 weeks? ---No  Does the PT have any chronic conditions? (i.e. diabetes, asthma, etc.) ---Yes  List chronic conditions. ---HTN, Insomnia, Allergies, Depression, Gout     Guidelines    Guideline Title Affirmed Question Affirmed Notes  Headache [1] MODERATE headache (e.g., interferes with normal activities) AND [2] present > 24 hours AND [3] unexplained (Exceptions: analgesics not tried, typical migraine, or headache part of viral illness)    Final Disposition User   See Physician within [facility], Therapist, sports, [employer]

## 2014-06-30 NOTE — Assessment & Plan Note (Signed)
I will give your azithromycin rx. Use your nasocort. For cough rx of hydromet

## 2014-06-30 NOTE — Telephone Encounter (Signed)
Appointment switched to [employer], PA-C today at 3:30 pm.

## 2014-06-30 NOTE — Telephone Encounter (Signed)
Chase [name], your patient. I saw him as acute X1. Can you get him in @ [address]?

## 2014-06-30 NOTE — Patient Instructions (Addendum)
With your blood pressure levels yesterday, poor heal to toe, hx of ministrokes and report of reoccuring severe ha today, I though ED evaluation for stat imaging would be needed.  .But you refused. If your symptoms worsen please reconsider.   I do want you to take your bp med twice a day which you only take it one time a day.  You should stay off of the otc cough syrups since I think they spiked your bp levels.  I will give your azithromycin rx. Use your nasocort. For cough rx of hydromet  Follow up in 7 days or as needed.

## 2014-06-30 NOTE — Telephone Encounter (Signed)
Left a message for call back.  

## 2014-06-30 NOTE — Assessment & Plan Note (Signed)
With your blood pressure levels yesterday, poor heal to toe, hx of ministrokes and report of reoccuring severe ha today, I though ED evaluation for stat imaging would be needed.  .But you refused ED evaluation. If your symptoms worsen please reconsider.   I do want you to take your bp med twice a day which you only take it one time a day.

## 2014-06-30 NOTE — Telephone Encounter (Signed)
Not sure if pt is wanting to come out here but in any case he needs to be called and probably needs an appointment

## 2014-06-30 NOTE — Progress Notes (Signed)
Subjective:    Patient ID: Chase [name], male    DOB: [birth year], 54 y.o.   MRN: [id number]  HPI   Pt in with severe ha. His blood pressure is up. He is getting readings 1876/112, 166/102, 170/106(readings yesterday and today). Pt feels dizziness. Some blurred vision sometimes.  Pt states severe ha yesterday. Little less now that it was yesterday. Pt ha is about 5/10 yesterday. Pt was taking decongestant for nasal congestion recently with pnd. Pt was taking some medicine with phenylephrine when his bp readings were high.   Ha back center of the head during exam and he is stating it getting more  Severe again. This was after reporting intitially less ha. Then mid way through interview he reports pain started to increase. Then little later stated decreasing again.   Pt states he can't go to ED and he refuses. He is concerned about finances.  Pt has couple of weeks. Sinus pressure and nasal congestion with pnd.  Past Medical History  Diagnosis Date  . Allergy   . Stroke   . Sleep apnea   . Headache(784.0)   . Arthritis   . GERD (gastroesophageal reflux disease)   . Hypertension   . Hyperlipidemia   . Hearing loss   . Nasal congestion   . Trouble swallowing   . Blood in stool   . Chest pain   . Leg swelling   . Constipation   . Rectal pain   . Difficulty urinating   . Thrombosed hemorrhoids   . Brain cyst   . PONV (postoperative nausea and vomiting)   . Diabetes mellitus without complication     History   Social History  . Marital Status: Single    Spouse Name: N/A    Number of Children: N/A  . Years of Education: N/A   Occupational History  . Not on file.   Social History Main Topics  . Smoking status: Never Smoker   . Smokeless tobacco: Never Used  . Alcohol Use: No     Comment: none  . Drug Use: No  . Sexual Activity: No   Other Topics Concern  . Not on file   Social History Narrative    Past Surgical History  Procedure Laterality Date  . Cataract  extraction      x 3  . Inner ear surgery      rt ear  . Eye surgery  1968, 1985, 1987    cataracts  . Inner ear surgery  1992  . Surgery - right arm      1983    Family History  Problem Relation Age of Onset  . Cancer Mother 65    breast  . Stroke Father   . Heart disease Father   . Hypertension Father   . Heart attack Father   . Hypertension Brother   . Hyperlipidemia Neg Hx   . Diabetes Neg Hx     Allergies  Allergen Reactions  . Amoxicillin Hives, Itching and Other (See Comments)    White tongue; ? hives  . Terfenadine     ? reaction    Current Outpatient Prescriptions on File Prior to Visit  Medication Sig Dispense Refill  . allopurinol (ZYLOPRIM) 100 MG tablet Take 1 tablet (100 mg total) by mouth daily. Office visit due now with PCP 90 tablet 0  . cetirizine (ZYRTEC) 10 MG tablet Take 1 tablet (10 mg total) by mouth daily. 30 tablet 11  . diazepam (VALIUM) 5 MG  tablet Take 1 tablet (5 mg total) by mouth 3 (three) times daily. 90 tablet 0  . FIBER SELECT GUMMIES PO Take 1 tablet by mouth daily.    Marland Kitchen glucose blood (BAYER CONTOUR NEXT TEST) test strip Use as instructed 100 each 3  . glucose blood (FREESTYLE INSULINX TEST) test strip Use as instructed test 4 (four) times daily. Dx. 250.00 100 each 1  . HYDROcodone-acetaminophen (HYCET) 7.5-325 mg/15 ml solution TAKE 15 ML EVERY 6 HOURS AS NEEDED FOR PAIN 120 mL 0  . hydrocortisone (ANUSOL-HC) 25 MG suppository PLACE 1 SUPPOSITORY INTO RECTUM TWICE A DAY 12 suppository 2  . hydrocortisone-pramoxine (ANALPRAM-HC) 2.5-1 % rectal cream PLACE 1 APPLICATION RECTALLY 2 (TWO) TIMES DAILY AS NEEDED. FOR HEMORROIDS. 30 g 2  . hydrOXYzine (ATARAX/VISTARIL) 10 MG tablet Take 1 tablet (10 mg total) by mouth 3 (three) times daily as needed. 30 tablet 2  . ibuprofen (ADVIL,MOTRIN) 100 MG/5ML suspension Take 200 mg by mouth every 4 (four) hours as needed for fever.    . Lancets (FREESTYLE) lancets Use as instructed test 4 (four) times  daily. Dx. 250.00 100 each 1  . meclizine (ANTIVERT) 25 MG tablet Take 1 tablet (25 mg total) by mouth 3 (three) times daily as needed. 60 tablet 0  . metoprolol tartrate (LOPRESSOR) 25 MG tablet TAKE 1 TABLET TWICE A DAY, MAY CRUSH IN FOOD 180 tablet 1  . mometasone (ELOCON) 0.1 % ointment Apply topically 2 (two) times daily. 45 g 0  . Multiple Vitamin (MULTIVITAMIN) tablet Take 1 tablet by mouth daily. GummyMultivitamin    . predniSONE (DELTASONE) 20 MG tablet Take 1 tablet (20 mg total) by mouth 2 (two) times daily. 14 tablet 0  . promethazine (PHENERGAN) 25 MG suppository Place 1 suppository (25 mg total) rectally 2 (two) times daily as needed for nausea. 30 each 0  . ranitidine (ZANTAC) 150 MG/10ML syrup Take 150 mg by mouth 2 (two) times daily as needed. For indigestion.    Marland Kitchen venlafaxine XR (EFFEXOR-XR) 37.5 MG 24 hr capsule TAKE 1 CAPSULE BY MOUTH DAILY. 30 capsule 5  . metFORMIN (GLUCOPHAGE) 500 MG tablet Take 500 mg by mouth daily as needed.    Marland Kitchen Phenylephrine-DM-GG (MUCINEX FAST-MAX CONGEST COUGH) 2.5-5-100 MG/5ML LIQD Take 20 mLs by mouth as needed.     No current facility-administered medications on file prior to visit.    There were no vitals taken for this visit.      Review of Systems  Constitutional: Negative for fever, chills, diaphoresis, activity change and fatigue.  HENT: Positive for congestion, ear pain, postnasal drip and sinus pressure. Negative for mouth sores, nosebleeds, sore throat and trouble swallowing.   Respiratory: Negative for cough, chest tightness and shortness of breath.   Cardiovascular: Negative for chest pain, palpitations and leg swelling.  Gastrointestinal: Negative for nausea, vomiting and abdominal pain.  Musculoskeletal: Negative for back pain, neck pain and neck stiffness.  Neurological: Positive for dizziness and headaches. Negative for tremors, seizures, syncope, facial asymmetry, speech difficulty, weakness, light-headedness and numbness.    Psychiatric/Behavioral: Negative for behavioral problems, confusion and agitation. The patient is not nervous/anxious.        Objective:   Physical Exam   General Mental Status- Alert. General Appearance- Not in acute distress.   Skin General: Color- Normal Color. Moisture- Normal Moisture.  Neck Carotid Arteries- Normal color. Moisture- Normal Moisture. No carotid bruits. No JVD.  Chest and Lung Exam Auscultation: Breath Sounds:-Normal.  Cardiovascular Auscultation:Rythm- Regular. Murmurs & Other  Heart Sounds:Auscultation of the heart reveals- No Murmurs.  Abdomen Inspection:-Inspeection Normal. Palpation/Percussion:Note:No mass. Palpation and Percussion of the abdomen reveal- Non Tender, Non Distended + BS, no rebound or guarding.    Neurologic Cranial Nerve exam:- CN III-XII intact(No nystagmus), symmetric smile. Drift Test:- No drift. Romberg Exam:- Negative.  Heal to Toe Gait exam:-Normal. Finger to Nose:- poor  Strength:- 5/5 equal and symmetric strength both upper and lower extremities.        Assessment & Plan:

## 2014-06-30 NOTE — Progress Notes (Signed)
Pre visit review using our clinic review tool, if applicable. No additional management support is needed unless otherwise documented below in the visit note. 

## 2014-07-03 ENCOUNTER — Telehealth: Payer: Self-pay | Admitting: Family Medicine

## 2014-07-03 ENCOUNTER — Other Ambulatory Visit: Payer: Self-pay | Admitting: Family Medicine

## 2014-07-03 MED ORDER — HYDROCODONE-ACETAMINOPHEN 7.5-325 MG/15ML PO SOLN: ORAL | Status: DC

## 2014-07-03 NOTE — Telephone Encounter (Signed)
Caller name: Raziel Relation to pt: self Call back number: [phone] Pharmacy: [facility] on [address]  Reason for call:   Patient states that he was seen in the office last week and was given a printed hydrocodone rx. He states that he took rx to the pharmacy and they would not fill this because Dr. Nonda Lou DEA# was not on it.

## 2014-07-03 NOTE — Telephone Encounter (Signed)
Message to lpn. Regarding his rx cough med.

## 2014-07-03 NOTE — Addendum Note (Signed)
Addended by: Ewing Schlein on: 07/03/2014 04:32 PM   Modules accepted: Orders

## 2014-07-03 NOTE — Telephone Encounter (Signed)
Spoke with patient and he wanted the hycet but got Hycodan instead. Per Dr.Lowne the patient will need to bring the Hydocan Rx back to the office. patient was also upset that No DEA number was on the script I made him aware Dr.Lowne is the authorizing provider and we may have been busy. Rx left at check in for pick up.   KP

## 2014-07-03 NOTE — Telephone Encounter (Signed)
Chase [name] gave him what we normally give him---- we need the rx to see it and then we can give another one

## 2014-07-24 ENCOUNTER — Other Ambulatory Visit: Payer: Self-pay | Admitting: Family Medicine

## 2014-07-24 NOTE — Telephone Encounter (Signed)
Last filled:  06/22/14 Amt: 90 tablets

## 2014-08-20 ENCOUNTER — Other Ambulatory Visit: Payer: Self-pay | Admitting: Family Medicine

## 2014-08-21 NOTE — Telephone Encounter (Signed)
Last seen by PCP 09/23/13. No documented Uric Acid since 2012. Please advise     KP

## 2014-08-23 ENCOUNTER — Other Ambulatory Visit: Payer: Self-pay

## 2014-08-23 MED ORDER — VENLAFAXINE HCL ER 37.5 MG PO CP24: 37.5000 mg | ORAL_CAPSULE | Freq: Every day | ORAL | Status: DC

## 2014-08-24 ENCOUNTER — Other Ambulatory Visit: Payer: Self-pay | Admitting: Family Medicine

## 2014-08-24 NOTE — Telephone Encounter (Signed)
Requesting Diazepam 5mg -Take 1 tablet 3 times a day. Last refill:06-05-14;#90,0 Last OV:06-30-14 UDS:09-02-12-Low risk-Due Please advise.//AB/CMA

## 2014-08-25 ENCOUNTER — Ambulatory Visit (INDEPENDENT_AMBULATORY_CARE_PROVIDER_SITE_OTHER): Payer: BLUE CROSS/BLUE SHIELD | Admitting: Physician Assistant

## 2014-08-25 ENCOUNTER — Ambulatory Visit (HOSPITAL_BASED_OUTPATIENT_CLINIC_OR_DEPARTMENT_OTHER)
Admission: RE | Admit: 2014-08-25 | Discharge: 2014-08-25 | Disposition: A | Discharge status: Home / Self Care | Payer: BLUE CROSS/BLUE SHIELD | Source: Ambulatory Visit | Attending: Physician Assistant | Admitting: Physician Assistant

## 2014-08-25 ENCOUNTER — Other Ambulatory Visit: Payer: Self-pay

## 2014-08-25 ENCOUNTER — Encounter: Payer: Self-pay | Admitting: Physician Assistant

## 2014-08-25 VITALS — BP 135/92 | HR 79 | Temp 98.7°F | Resp 16 | Ht 70.0 in | Wt 194.4 lb

## 2014-08-25 DIAGNOSIS — R05 Cough: Secondary | ICD-10-CM | POA: Diagnosis present

## 2014-08-25 DIAGNOSIS — H6983 Other specified disorders of Eustachian tube, bilateral: Secondary | ICD-10-CM

## 2014-08-25 DIAGNOSIS — K219 Gastro-esophageal reflux disease without esophagitis: Secondary | ICD-10-CM

## 2014-08-25 DIAGNOSIS — K648 Other hemorrhoids: Secondary | ICD-10-CM

## 2014-08-25 DIAGNOSIS — J324 Chronic pansinusitis: Secondary | ICD-10-CM | POA: Diagnosis not present

## 2014-08-25 DIAGNOSIS — R053 Chronic cough: Secondary | ICD-10-CM

## 2014-08-25 DIAGNOSIS — J449 Chronic obstructive pulmonary disease, unspecified: Secondary | ICD-10-CM | POA: Diagnosis not present

## 2014-08-25 MED ORDER — RANITIDINE HCL 150 MG/10ML PO SYRP: 150.0000 mg | ORAL_SOLUTION | Freq: Two times a day (BID) | ORAL | Status: DC | PRN

## 2014-08-25 MED ORDER — HYDROCODONE-ACETAMINOPHEN 7.5-325 MG/15ML PO SOLN: ORAL | Status: DC

## 2014-08-25 MED ORDER — METHYLPREDNISOLONE ACETATE 40 MG/ML IJ SUSP
40.0000 mg | Freq: Once | INTRAMUSCULAR | Status: AC
  Administered 2014-08-25: 40 mg via INTRAMUSCULAR

## 2014-08-25 MED ORDER — HYDROCORTISONE ACETATE 25 MG RE SUPP: RECTAL | Status: DC

## 2014-08-25 MED ORDER — TRIAMCINOLONE ACETONIDE 55 MCG/ACT NA AERO: 2.0000 | INHALATION_SPRAY | Freq: Every day | NASAL | Status: DC

## 2014-08-25 MED ORDER — VENLAFAXINE HCL ER 37.5 MG PO CP24: 37.5000 mg | ORAL_CAPSULE | Freq: Every day | ORAL | Status: DC

## 2014-08-25 MED ORDER — MECLIZINE HCL 25 MG PO TABS: 25.0000 mg | ORAL_TABLET | Freq: Three times a day (TID) | ORAL | Status: DC | PRN

## 2014-08-25 MED ORDER — CLARITHROMYCIN 250 MG/5ML PO SUSR: 500.0000 mg | Freq: Two times a day (BID) | ORAL | Status: DC

## 2014-08-25 NOTE — Patient Instructions (Signed)
Please take antibiotic as directed.  Continue the Nasocort.  Use plain Mucinex for congestion.  The steroid given today should help jump-start things.  Continue other medications as directed.  Use the steroid suppository as directed. Increase fluids.  You will be contacted by Colorectal surgery for further assessment.

## 2014-08-25 NOTE — Progress Notes (Signed)
Pre visit review using our clinic review tool, if applicable. No additional management support is needed unless otherwise documented below in the visit note/SLS  

## 2014-08-27 DIAGNOSIS — R053 Chronic cough: Secondary | ICD-10-CM | POA: Insufficient documentation

## 2014-08-27 DIAGNOSIS — R05 Cough: Secondary | ICD-10-CM | POA: Insufficient documentation

## 2014-08-27 HISTORY — DX: Chronic cough: R05.3

## 2014-08-27 NOTE — Assessment & Plan Note (Signed)
Chronic.  Will begin Clarithromycin suspension for 15 days. Supportive measures discussed.  Im Depomedrol given to help with inflammation and patient's eustachian tube dysfunction.  Follow-up if no improvement.

## 2014-08-27 NOTE — Assessment & Plan Note (Signed)
Rx rectal suppositories.  Diet and importance of soft stools discussed.  Supportive measures discussed.  Referral placed to colorectal surgery for further management.

## 2014-08-27 NOTE — Progress Notes (Signed)
Patient presents to clinic today c/o acute flare-up of his chronic sinusitis.  Endorses sinus pressure, sinus pain, ear pain, facial pain and ear popping.  Endorses some mild ringing in his ears and dizziness.  Denies fever, chills.  Endorses malaise and fatigue.  Was recently given a Z-pack with no relief of symptoms.  Patient also requesting refill of his rectal cream for hemorrhoids.  Endorses chronic hemorrhoids that have worsened over the past month.  States he has never spoken to colorectal surgery in regards to other treatment options.  Has been using Annusol rectal cream intermittently for the past several months.  Endorses tenesmus.  Denies bleeding.  Patient also endorses chronic intermittent cough.  Denies hx of asthma or COPD. Denies hemoptysis.  Denies reflux symptoms at present but has history of GERD..  Is a non-smoker but endorses significant second hand smoke exposure. Is requesting a CXR to assess for carcinoma.  Past Medical History  Diagnosis Date  . Allergy   . Stroke   . Sleep apnea   . Headache(784.0)   . Arthritis   . GERD (gastroesophageal reflux disease)   . Hypertension   . Hyperlipidemia   . Hearing loss   . Nasal congestion   . Trouble swallowing   . Blood in stool   . Chest pain   . Leg swelling   . Constipation   . Rectal pain   . Difficulty urinating   . Thrombosed hemorrhoids   . Brain cyst   . PONV (postoperative nausea and vomiting)   . Diabetes mellitus without complication     Current Outpatient Prescriptions on File Prior to Visit  Medication Sig Dispense Refill  . allopurinol (ZYLOPRIM) 100 MG tablet Take 1 tablet (100 mg total) by mouth daily. 90 tablet 1  . cetirizine (ZYRTEC) 10 MG tablet Take 1 tablet (10 mg total) by mouth daily. 30 tablet 11  . diazepam (VALIUM) 5 MG tablet TAKE 1 TABLET 3 TIMES A DAY 90 tablet 0  . FIBER SELECT GUMMIES PO Take 1 tablet by mouth daily.    Marland Kitchen glucose blood (BAYER CONTOUR NEXT TEST) test strip Use  as instructed 100 each 3  . glucose blood (FREESTYLE INSULINX TEST) test strip Use as instructed test 4 (four) times daily. Dx. 250.00 100 each 1  . hydrOXYzine (ATARAX/VISTARIL) 10 MG tablet Take 1 tablet (10 mg total) by mouth 3 (three) times daily as needed. 30 tablet 2  . ibuprofen (ADVIL,MOTRIN) 100 MG/5ML suspension Take 200 mg by mouth every 4 (four) hours as needed for fever.    . Lancets (FREESTYLE) lancets Use as instructed test 4 (four) times daily. Dx. 250.00 100 each 1  . metoprolol tartrate (LOPRESSOR) 25 MG tablet TAKE 1 TABLET TWICE A DAY, MAY CRUSH IN FOOD 180 tablet 1  . mometasone (ELOCON) 0.1 % ointment Apply topically 2 (two) times daily. 45 g 0  . predniSONE (DELTASONE) 20 MG tablet Take 1 tablet (20 mg total) by mouth 2 (two) times daily. 14 tablet 0  . promethazine (PHENERGAN) 25 MG suppository Place 1 suppository (25 mg total) rectally 2 (two) times daily as needed for nausea. 30 each 0  . Multiple Vitamin (MULTIVITAMIN) tablet Take 1 tablet by mouth daily. GummyMultivitamin     No current facility-administered medications on file prior to visit.    Allergies  Allergen Reactions  . Amoxicillin Hives, Itching and Other (See Comments)    White tongue; ? hives  . Flonase [Fluticasone Propionate]  Nasal Sores  . Terfenadine     ? reaction    Family History  Problem Relation Age of Onset  . Cancer Mother 58    breast  . Stroke Father   . Heart disease Father   . Hypertension Father   . Heart attack Father   . Hypertension Brother   . Hyperlipidemia Neg Hx   . Diabetes Neg Hx     History   Social History  . Marital Status: Single    Spouse Name: N/A  . Number of Children: N/A  . Years of Education: N/A   Social History Main Topics  . Smoking status: Never Smoker   . Smokeless tobacco: Never Used  . Alcohol Use: No     Comment: none  . Drug Use: No  . Sexual Activity: No   Other Topics Concern  . None   Social History Narrative   Review  of Systems - See HPI.  All other ROS are negative.  BP 135/92 mmHg  Pulse 79  Temp(Src) 98.7 F (37.1 C) (Oral)  Resp 16  Ht 5\' 10"  (1.778 m)  Wt 194 lb 6 oz (88.168 kg)  BMI 27.89 kg/m2  SpO2 98%  Physical Exam  Constitutional: He is oriented to person, place, and time and well-developed, well-nourished, and in no distress.  HENT:  Head: Normocephalic and atraumatic.  Right Ear: External ear and ear canal normal. A middle ear effusion is present.  Left Ear: External ear and ear canal normal. A middle ear effusion is present.  Nose: Mucosal edema and rhinorrhea present. Right sinus exhibits maxillary sinus tenderness and frontal sinus tenderness. Left sinus exhibits maxillary sinus tenderness and frontal sinus tenderness.  Mouth/Throat: Uvula is midline, oropharynx is clear and moist and mucous membranes are normal.  Eyes: Conjunctivae are normal. Pupils are equal, round, and reactive to light.  Neck: Neck supple.  Cardiovascular: Normal rate, regular rhythm, normal heart sounds and intact distal pulses.   Pulmonary/Chest: Effort normal and breath sounds normal. No respiratory distress. He has no wheezes. He has no rales. He exhibits no tenderness.  Lymphadenopathy:    He has no cervical adenopathy.  Neurological: He is alert and oriented to person, place, and time.  Skin: Skin is warm and dry. No rash noted.  Psychiatric: Affect normal.  Vitals reviewed.   No results found for this or any previous visit (from the past 2160 hour(s)).  Assessment/Plan: Chronic cough Unclear etiology.  Could be due to his hx of GERD.  Encouraged him to restart his medications.  Giving history and chronicity of symptoms, will proceed with CXR.     GERD Medications refilled.  GERD diet discussed.   Sinus infection Chronic.  Will begin Clarithromycin suspension for 15 days. Supportive measures discussed.  Im Depomedrol given to help with inflammation and patient's eustachian tube dysfunction.   Follow-up if no improvement.   Prolapsed hemorrhoids Rx rectal suppositories.  Diet and importance of soft stools discussed.  Supportive measures discussed.  Referral placed to colorectal surgery for further management.

## 2014-08-27 NOTE — Assessment & Plan Note (Signed)
Medications refilled.  GERD diet discussed.

## 2014-08-27 NOTE — Assessment & Plan Note (Signed)
Unclear etiology.  Could be due to his hx of GERD.  Encouraged him to restart his medications.  Giving history and chronicity of symptoms, will proceed with CXR.

## 2014-08-28 ENCOUNTER — Telehealth: Payer: Self-pay | Admitting: Physician Assistant

## 2014-08-28 DIAGNOSIS — J449 Chronic obstructive pulmonary disease, unspecified: Secondary | ICD-10-CM

## 2014-08-28 DIAGNOSIS — R05 Cough: Secondary | ICD-10-CM

## 2014-08-28 DIAGNOSIS — R053 Chronic cough: Secondary | ICD-10-CM

## 2014-08-28 NOTE — Telephone Encounter (Signed)
Referral placed.

## 2014-08-28 NOTE — Telephone Encounter (Signed)
-----   Message from Peggyann Shoals, RMA sent at 08/28/2014  2:26 PM EDT ----- Pt notified of results . Pt agrees to see pulmonology .

## 2014-08-31 ENCOUNTER — Encounter: Payer: Self-pay | Admitting: Family Medicine

## 2014-09-10 ENCOUNTER — Other Ambulatory Visit: Payer: Self-pay | Admitting: Family Medicine

## 2014-09-12 ENCOUNTER — Telehealth: Payer: Self-pay | Admitting: Family Medicine

## 2014-09-12 NOTE — Telephone Encounter (Signed)
Pre visit letter sent  °

## 2014-09-14 ENCOUNTER — Other Ambulatory Visit: Payer: Self-pay | Admitting: Physician Assistant

## 2014-09-18 ENCOUNTER — Institutional Professional Consult (permissible substitution): Payer: BLUE CROSS/BLUE SHIELD | Admitting: Internal Medicine

## 2014-09-18 ENCOUNTER — Encounter: Payer: Self-pay | Admitting: Family Medicine

## 2014-09-18 ENCOUNTER — Other Ambulatory Visit: Payer: Self-pay | Admitting: Physician Assistant

## 2014-09-19 ENCOUNTER — Other Ambulatory Visit: Payer: Self-pay

## 2014-09-19 DIAGNOSIS — J329 Chronic sinusitis, unspecified: Secondary | ICD-10-CM

## 2014-09-19 NOTE — Telephone Encounter (Signed)
He needs to see ENT

## 2014-09-20 ENCOUNTER — Other Ambulatory Visit: Payer: Self-pay | Admitting: Family Medicine

## 2014-09-22 ENCOUNTER — Encounter: Payer: Self-pay | Admitting: Family Medicine

## 2014-09-25 ENCOUNTER — Other Ambulatory Visit: Payer: Self-pay | Admitting: Physician Assistant

## 2014-09-25 MED ORDER — HYDROCODONE-ACETAMINOPHEN 7.5-325 MG/15ML PO SOLN: ORAL | Status: DC

## 2014-09-25 NOTE — Telephone Encounter (Signed)
Ok to refill x 1  

## 2014-09-25 NOTE — Telephone Encounter (Signed)
Last filled 08/25/14 #120 Apt scheduled for 10/02/14   Please advise.       KP

## 2014-09-27 ENCOUNTER — Other Ambulatory Visit: Payer: Self-pay | Admitting: Family Medicine

## 2014-09-29 ENCOUNTER — Encounter: Payer: Self-pay | Admitting: Physician Assistant

## 2014-09-29 ENCOUNTER — Other Ambulatory Visit: Payer: Self-pay | Admitting: Physician Assistant

## 2014-10-03 ENCOUNTER — Telehealth: Payer: Self-pay | Admitting: Family Medicine

## 2014-10-03 ENCOUNTER — Ambulatory Visit (INDEPENDENT_AMBULATORY_CARE_PROVIDER_SITE_OTHER): Payer: BLUE CROSS/BLUE SHIELD | Admitting: Family Medicine

## 2014-10-03 ENCOUNTER — Encounter: Payer: Self-pay | Admitting: Family Medicine

## 2014-10-03 VITALS — BP 132/90 | HR 77 | Temp 98.9°F | Ht 70.0 in | Wt 194.4 lb

## 2014-10-03 DIAGNOSIS — N5319 Other ejaculatory dysfunction: Secondary | ICD-10-CM

## 2014-10-03 DIAGNOSIS — L299 Pruritus, unspecified: Secondary | ICD-10-CM | POA: Diagnosis not present

## 2014-10-03 DIAGNOSIS — E1139 Type 2 diabetes mellitus with other diabetic ophthalmic complication: Secondary | ICD-10-CM | POA: Diagnosis not present

## 2014-10-03 DIAGNOSIS — I10 Essential (primary) hypertension: Secondary | ICD-10-CM

## 2014-10-03 DIAGNOSIS — Z23 Encounter for immunization: Secondary | ICD-10-CM

## 2014-10-03 DIAGNOSIS — IMO0002 Reserved for concepts with insufficient information to code with codable children: Secondary | ICD-10-CM

## 2014-10-03 DIAGNOSIS — Z0000 Encounter for general adult medical examination without abnormal findings: Secondary | ICD-10-CM | POA: Diagnosis not present

## 2014-10-03 DIAGNOSIS — R195 Other fecal abnormalities: Secondary | ICD-10-CM | POA: Diagnosis not present

## 2014-10-03 DIAGNOSIS — E1165 Type 2 diabetes mellitus with hyperglycemia: Secondary | ICD-10-CM

## 2014-10-03 DIAGNOSIS — N508 Other specified disorders of male genital organs: Secondary | ICD-10-CM

## 2014-10-03 MED ORDER — GLUCOSE BLOOD VI STRP: ORAL_STRIP | Status: DC

## 2014-10-03 MED ORDER — HYDROXYZINE HCL 25 MG PO TABS: 25.0000 mg | ORAL_TABLET | Freq: Three times a day (TID) | ORAL | Status: DC | PRN

## 2014-10-03 NOTE — Patient Instructions (Signed)
Preventive Care for Adults A healthy lifestyle and preventive care can promote health and wellness. Preventive health guidelines for men include the following key practices:  A routine yearly physical is a good way to check with your health care provider about your health and preventative screening. It is a chance to share any concerns and updates on your health and to receive a thorough exam.  Visit your dentist for a routine exam and preventative care every 6 months. Brush your teeth twice a day and floss once a day. Good oral hygiene prevents tooth decay and gum disease.  The frequency of eye exams is based on your age, health, family medical history, use of contact lenses, and other factors. Follow your health care provider's recommendations for frequency of eye exams.  Eat a healthy diet. Foods such as vegetables, fruits, whole grains, low-fat dairy products, and lean protein foods contain the nutrients you need without too many calories. Decrease your intake of foods high in solid fats, added sugars, and salt. Eat the right amount of calories for you.Get information about a proper diet from your health care provider, if necessary.  Regular physical exercise is one of the most important things you can do for your health. Most adults should get at least 150 minutes of moderate-intensity exercise (any activity that increases your heart rate and causes you to sweat) each week. In addition, most adults need muscle-strengthening exercises on 2 or more days a week.  Maintain a healthy weight. The body mass index (BMI) is a screening tool to identify possible weight problems. It provides an estimate of body fat based on height and weight. Your health care provider can find your BMI and can help you achieve or maintain a healthy weight.For adults 20 years and older:  A BMI below 18.5 is considered underweight.  A BMI of 18.5 to 24.9 is normal.  A BMI of 25 to 29.9 is considered overweight.  A BMI  of 30 and above is considered obese.  Maintain normal blood lipids and cholesterol levels by exercising and minimizing your intake of saturated fat. Eat a balanced diet with plenty of fruit and vegetables. Blood tests for lipids and cholesterol should begin at age 50 and be repeated every 5 years. If your lipid or cholesterol levels are high, you are over 50, or you are at high risk for heart disease, you may need your cholesterol levels checked more frequently.Ongoing high lipid and cholesterol levels should be treated with medicines if diet and exercise are not working.  If you smoke, find out from your health care provider how to quit. If you do not use tobacco, do not start.  Lung cancer screening is recommended for adults aged 73-80 years who are at high risk for developing lung cancer because of a history of smoking. A yearly low-dose CT scan of the lungs is recommended for people who have at least a 30-pack-year history of smoking and are a current smoker or have quit within the past 15 years. A pack year of smoking is smoking an average of 1 pack of cigarettes a day for 1 year (for example: 1 pack a day for 30 years or 2 packs a day for 15 years). Yearly screening should continue until the smoker has stopped smoking for at least 15 years. Yearly screening should be stopped for people who develop a health problem that would prevent them from having lung cancer treatment.  If you choose to drink alcohol, do not have more than  2 drinks per day. One drink is considered to be 12 ounces (355 mL) of beer, 5 ounces (148 mL) of wine, or 1.5 ounces (44 mL) of liquor.  Avoid use of street drugs. Do not share needles with anyone. Ask for help if you need support or instructions about stopping the use of drugs.  High blood pressure causes heart disease and increases the risk of stroke. Your blood pressure should be checked at least every 1-2 years. Ongoing high blood pressure should be treated with  medicines, if weight loss and exercise are not effective.  If you are 45-79 years old, ask your health care provider if you should take aspirin to prevent heart disease.  Diabetes screening involves taking a blood sample to check your fasting blood sugar level. This should be done once every 3 years, after age 45, if you are within normal weight and without risk factors for diabetes. Testing should be considered at a younger age or be carried out more frequently if you are overweight and have at least 1 risk factor for diabetes.  Colorectal cancer can be detected and often prevented. Most routine colorectal cancer screening begins at the age of 50 and continues through age 75. However, your health care provider may recommend screening at an earlier age if you have risk factors for colon cancer. On a yearly basis, your health care provider may provide home test kits to check for hidden blood in the stool. Use of a small camera at the end of a tube to directly examine the colon (sigmoidoscopy or colonoscopy) can detect the earliest forms of colorectal cancer. Talk to your health care provider about this at age 50, when routine screening begins. Direct exam of the colon should be repeated every 5-10 years through age 75, unless early forms of precancerous polyps or small growths are found.  People who are at an increased risk for hepatitis B should be screened for this virus. You are considered at high risk for hepatitis B if:  You were born in a country where hepatitis B occurs often. Talk with your health care provider about which countries are considered high risk.  Your parents were born in a high-risk country and you have not received a shot to protect against hepatitis B (hepatitis B vaccine).  You have HIV or AIDS.  You use needles to inject street drugs.  You live with, or have sex with, someone who has hepatitis B.  You are a man who has sex with other men (MSM).  You get hemodialysis  treatment.  You take certain medicines for conditions such as cancer, organ transplantation, and autoimmune conditions.  Hepatitis C blood testing is recommended for all people born from 1945 through 1965 and any individual with known risks for hepatitis C.  Practice safe sex. Use condoms and avoid high-risk sexual practices to reduce the spread of sexually transmitted infections (STIs). STIs include gonorrhea, chlamydia, syphilis, trichomonas, herpes, HPV, and human immunodeficiency virus (HIV). Herpes, HIV, and HPV are viral illnesses that have no cure. They can result in disability, cancer, and death.  If you are at risk of being infected with HIV, it is recommended that you take a prescription medicine daily to prevent HIV infection. This is called preexposure prophylaxis (PrEP). You are considered at risk if:  You are a man who has sex with other men (MSM) and have other risk factors.  You are a heterosexual man, are sexually active, and are at increased risk for HIV infection.    You take drugs by injection.  You are sexually active with a partner who has HIV.  Talk with your health care provider about whether you are at high risk of being infected with HIV. If you choose to begin PrEP, you should first be tested for HIV. You should then be tested every 3 months for as long as you are taking PrEP.  A one-time screening for abdominal aortic aneurysm (AAA) and surgical repair of large AAAs by ultrasound are recommended for men ages 32 to 67 years who are current or former smokers.  Healthy men should no longer receive prostate-specific antigen (PSA) blood tests as part of routine cancer screening. Talk with your health care provider about prostate cancer screening.  Testicular cancer screening is not recommended for adult males who have no symptoms. Screening includes self-exam, a health care provider exam, and other screening tests. Consult with your health care provider about any symptoms  you have or any concerns you have about testicular cancer.  Use sunscreen. Apply sunscreen liberally and repeatedly throughout the day. You should seek shade when your shadow is shorter than you. Protect yourself by wearing long sleeves, pants, a wide-brimmed hat, and sunglasses year round, whenever you are outdoors.  Once a month, do a whole-body skin exam, using a mirror to look at the skin on your back. Tell your health care provider about new moles, moles that have irregular borders, moles that are larger than a pencil eraser, or moles that have changed in shape or color.  Stay current with required vaccines (immunizations).  Influenza vaccine. All adults should be immunized every year.  Tetanus, diphtheria, and acellular pertussis (Td, Tdap) vaccine. An adult who has not previously received Tdap or who does not know his vaccine status should receive 1 dose of Tdap. This initial dose should be followed by tetanus and diphtheria toxoids (Td) booster doses every 10 years. Adults with an unknown or incomplete history of completing a 3-dose immunization series with Td-containing vaccines should begin or complete a primary immunization series including a Tdap dose. Adults should receive a Td booster every 10 years.  Varicella vaccine. An adult without evidence of immunity to varicella should receive 2 doses or a second dose if he has previously received 1 dose.  Human papillomavirus (HPV) vaccine. Males aged 68-21 years who have not received the vaccine previously should receive the 3-dose series. Males aged 22-26 years may be immunized. Immunization is recommended through the age of 6 years for any male who has sex with males and did not get any or all doses earlier. Immunization is recommended for any person with an immunocompromised condition through the age of 49 years if he did not get any or all doses earlier. During the 3-dose series, the second dose should be obtained 4-8 weeks after the first  dose. The third dose should be obtained 24 weeks after the first dose and 16 weeks after the second dose.  Zoster vaccine. One dose is recommended for adults aged 50 years or older unless certain conditions are present.  Measles, mumps, and rubella (MMR) vaccine. Adults born before [birth year] generally are considered immune to measles and mumps. Adults born in [birth year] or later should have 1 or more doses of MMR vaccine unless there is a contraindication to the vaccine or there is laboratory evidence of immunity to each of the three diseases. A routine second dose of MMR vaccine should be obtained at least 28 days after the first dose for students attending postsecondary  schools, health care workers, or international travelers. People who received inactivated measles vaccine or an unknown type of measles vaccine during 1963-1967 should receive 2 doses of MMR vaccine. People who received inactivated mumps vaccine or an unknown type of mumps vaccine before 1979 and are at high risk for mumps infection should consider immunization with 2 doses of MMR vaccine. Unvaccinated health care workers born before 1957 who lack laboratory evidence of measles, mumps, or rubella immunity or laboratory confirmation of disease should consider measles and mumps immunization with 2 doses of MMR vaccine or rubella immunization with 1 dose of MMR vaccine.  Pneumococcal 13-valent conjugate (PCV13) vaccine. When indicated, a person who is uncertain of his immunization history and has no record of immunization should receive the PCV13 vaccine. An adult aged 19 years or older who has certain medical conditions and has not been previously immunized should receive 1 dose of PCV13 vaccine. This PCV13 should be followed with a dose of pneumococcal polysaccharide (PPSV23) vaccine. The PPSV23 vaccine dose should be obtained at least 8 weeks after the dose of PCV13 vaccine. An adult aged 19 years or older who has certain medical conditions and  previously received 1 or more doses of PPSV23 vaccine should receive 1 dose of PCV13. The PCV13 vaccine dose should be obtained 1 or more years after the last PPSV23 vaccine dose.  Pneumococcal polysaccharide (PPSV23) vaccine. When PCV13 is also indicated, PCV13 should be obtained first. All adults aged 65 years and older should be immunized. An adult younger than age 65 years who has certain medical conditions should be immunized. Any person who resides in a nursing home or long-term care facility should be immunized. An adult smoker should be immunized. People with an immunocompromised condition and certain other conditions should receive both PCV13 and PPSV23 vaccines. People with human immunodeficiency virus (HIV) infection should be immunized as soon as possible after diagnosis. Immunization during chemotherapy or radiation therapy should be avoided. Routine use of PPSV23 vaccine is not recommended for American Indians, [place] Natives, or people younger than 65 years unless there are medical conditions that require PPSV23 vaccine. When indicated, people who have unknown immunization and have no record of immunization should receive PPSV23 vaccine. One-time revaccination 5 years after the first dose of PPSV23 is recommended for people aged 19-64 years who have chronic kidney failure, nephrotic syndrome, asplenia, or immunocompromised conditions. People who received 1-2 doses of PPSV23 before age 65 years should receive another dose of PPSV23 vaccine at age 65 years or later if at least 5 years have passed since the previous dose. Doses of PPSV23 are not needed for people immunized with PPSV23 at or after age 65 years.  Meningococcal vaccine. Adults with asplenia or persistent complement component deficiencies should receive 2 doses of quadrivalent meningococcal conjugate (MenACWY-D) vaccine. The doses should be obtained at least 2 months apart. Microbiologists working with certain meningococcal bacteria,  military recruits, people at risk during an outbreak, and people who travel to or live in countries with a high rate of meningitis should be immunized. A first-year college student up through age 21 years who is living in a residence hall should receive a dose if he did not receive a dose on or after his 16th birthday. Adults who have certain high-risk conditions should receive one or more doses of vaccine.  Hepatitis A vaccine. Adults who wish to be protected from this disease, have certain high-risk conditions, work with hepatitis A-infected animals, work in hepatitis A research labs, or   travel to or work in countries with a high rate of hepatitis A should be immunized. Adults who were previously unvaccinated and who anticipate close contact with an international adoptee during the first 60 days after arrival in the [place] from a country with a high rate of hepatitis A should be immunized.  Hepatitis B vaccine. Adults should be immunized if they wish to be protected from this disease, have certain high-risk conditions, may be exposed to blood or other infectious body fluids, are household contacts or sex partners of hepatitis B positive people, are clients or workers in certain care facilities, or travel to or work in countries with a high rate of hepatitis B.  Haemophilus influenzae type b (Hib) vaccine. A previously unvaccinated person with asplenia or sickle cell disease or having a scheduled splenectomy should receive 1 dose of Hib vaccine. Regardless of previous immunization, a recipient of a hematopoietic stem cell transplant should receive a 3-dose series 6-12 months after his successful transplant. Hib vaccine is not recommended for adults with HIV infection. Preventive Service / Frequency Ages 52 to 17  Blood pressure check.** / Every 1 to 2 years.  Lipid and cholesterol check.** / Every 5 years beginning at age 69.  Hepatitis C blood test.** / For any individual with known risks for  hepatitis C.  Skin self-exam. / Monthly.  Influenza vaccine. / Every year.  Tetanus, diphtheria, and acellular pertussis (Tdap, Td) vaccine.** / Consult your health care provider. 1 dose of Td every 10 years.  Varicella vaccine.** / Consult your health care provider.  HPV vaccine. / 3 doses over 6 months, if 72 or younger.  Measles, mumps, rubella (MMR) vaccine.** / You need at least 1 dose of MMR if you were born in 1957 or later. You may also need a second dose.  Pneumococcal 13-valent conjugate (PCV13) vaccine.** / Consult your health care provider.  Pneumococcal polysaccharide (PPSV23) vaccine.** / 1 to 2 doses if you smoke cigarettes or if you have certain conditions.  Meningococcal vaccine.** / 1 dose if you are age 35 to 60 years and a Market researcher living in a residence hall, or have one of several medical conditions. You may also need additional booster doses.  Hepatitis A vaccine.** / Consult your health care provider.  Hepatitis B vaccine.** / Consult your health care provider.  Haemophilus influenzae type b (Hib) vaccine.** / Consult your health care provider. Ages 35 to 8  Blood pressure check.** / Every 1 to 2 years.  Lipid and cholesterol check.** / Every 5 years beginning at age 57.  Lung cancer screening. / Every year if you are aged 44-80 years and have a 30-pack-year history of smoking and currently smoke or have quit within the past 15 years. Yearly screening is stopped once you have quit smoking for at least 15 years or develop a health problem that would prevent you from having lung cancer treatment.  Fecal occult blood test (FOBT) of stool. / Every year beginning at age 55 and continuing until age 73. You may not have to do this test if you get a colonoscopy every 10 years.  Flexible sigmoidoscopy** or colonoscopy.** / Every 5 years for a flexible sigmoidoscopy or every 10 years for a colonoscopy beginning at age 28 and continuing until age  1.  Hepatitis C blood test.** / For all people born from [birth year] through 1965 and any individual with known risks for hepatitis C.  Skin self-exam. / Monthly.  Influenza vaccine. / Every  year.  Tetanus, diphtheria, and acellular pertussis (Tdap/Td) vaccine.** / Consult your health care provider. 1 dose of Td every 10 years.  Varicella vaccine.** / Consult your health care provider.  Zoster vaccine.** / 1 dose for adults aged 53 years or older.  Measles, mumps, rubella (MMR) vaccine.** / You need at least 1 dose of MMR if you were born in 1957 or later. You may also need a second dose.  Pneumococcal 13-valent conjugate (PCV13) vaccine.** / Consult your health care provider.  Pneumococcal polysaccharide (PPSV23) vaccine.** / 1 to 2 doses if you smoke cigarettes or if you have certain conditions.  Meningococcal vaccine.** / Consult your health care provider.  Hepatitis A vaccine.** / Consult your health care provider.  Hepatitis B vaccine.** / Consult your health care provider.  Haemophilus influenzae type b (Hib) vaccine.** / Consult your health care provider. Ages 77 and over  Blood pressure check.** / Every 1 to 2 years.  Lipid and cholesterol check.**/ Every 5 years beginning at age 85.  Lung cancer screening. / Every year if you are aged 55-80 years and have a 30-pack-year history of smoking and currently smoke or have quit within the past 15 years. Yearly screening is stopped once you have quit smoking for at least 15 years or develop a health problem that would prevent you from having lung cancer treatment.  Fecal occult blood test (FOBT) of stool. / Every year beginning at age 33 and continuing until age 11. You may not have to do this test if you get a colonoscopy every 10 years.  Flexible sigmoidoscopy** or colonoscopy.** / Every 5 years for a flexible sigmoidoscopy or every 10 years for a colonoscopy beginning at age 28 and continuing until age 73.  Hepatitis C blood  test.** / For all people born from [birth year] through 1965 and any individual with known risks for hepatitis C.  Abdominal aortic aneurysm (AAA) screening.** / A one-time screening for ages 50 to 27 years who are current or former smokers.  Skin self-exam. / Monthly.  Influenza vaccine. / Every year.  Tetanus, diphtheria, and acellular pertussis (Tdap/Td) vaccine.** / 1 dose of Td every 10 years.  Varicella vaccine.** / Consult your health care provider.  Zoster vaccine.** / 1 dose for adults aged 34 years or older.  Pneumococcal 13-valent conjugate (PCV13) vaccine.** / Consult your health care provider.  Pneumococcal polysaccharide (PPSV23) vaccine.** / 1 dose for all adults aged 63 years and older.  Meningococcal vaccine.** / Consult your health care provider.  Hepatitis A vaccine.** / Consult your health care provider.  Hepatitis B vaccine.** / Consult your health care provider.  Haemophilus influenzae type b (Hib) vaccine.** / Consult your health care provider. **Family history and personal history of risk and conditions may change your health care provider's recommendations. Document Released: 07/29/2001 Document Revised: 06/07/2013 Document Reviewed: 10/28/2010 [facility] Patient Information 2015 [place], [place]. This information is not intended to replace advice given to you by your health care provider. Make sure you discuss any questions you have with your health care provider.

## 2014-10-03 NOTE — Progress Notes (Signed)
Pre visit review using our clinic review tool, if applicable. No additional management support is needed unless otherwise documented below in the visit note. 

## 2014-10-03 NOTE — Telephone Encounter (Signed)
Rx faxed.    KP 

## 2014-10-03 NOTE — Telephone Encounter (Signed)
Caller name:Valbuena, Jiraiya Relation to LP:[id number] Call back [phone] Pharmacy:[email or web address]   Reason for call: pt needs rx for one touch verio flex test strips

## 2014-10-03 NOTE — Progress Notes (Signed)
Patient ID: Chase [name], male    DOB: 1960/10/04  Age: 54 y.o. MRN: [id number]    Subjective:  Subjective HPI Chase [name] presents for cpe. F/u dm , htn, and cholesterol.   HPI HYPERTENSION  Blood pressure range-not checking  Chest pain- no      Dyspnea- no Lightheadedness- no   Edema- no Other side effects - no   Medication compliance: good Low salt diet- yes  DIABETES  Blood Sugar ranges-not checking   Polyuria- no New Visual problems- no Hypoglycemic symptoms- no Other side effects-no Medication compliance - poor Last eye exam- due Foot exam- today  HYPERLIPIDEMIA  Medication compliance- no RUQ pain- no  Muscle aches- no Other side effects-no   Review of Systems  Constitutional: Negative.   HENT: Negative for congestion, ear pain, hearing loss, nosebleeds, postnasal drip, rhinorrhea, sinus pressure, sneezing and tinnitus.   Eyes: Negative for photophobia, discharge, itching and visual disturbance.  Respiratory: Negative.   Cardiovascular: Negative.   Gastrointestinal: Negative for abdominal pain, constipation, blood in stool, abdominal distention and anal bleeding.  Endocrine: Negative.   Genitourinary: Negative.   Musculoskeletal: Negative.   Skin: Negative.   Allergic/Immunologic: Negative.   Neurological: Negative for dizziness, weakness, light-headedness, numbness and headaches.  Psychiatric/Behavioral: Negative for suicidal ideas, confusion, sleep disturbance, dysphoric mood, decreased concentration and agitation. The patient is not nervous/anxious.     Review of Systems  Constitutional: Negative for activity change, appetite change and fatigue.  HENT: Negative for hearing loss, congestion, tinnitus and ear discharge.  dentist q81m Eyes: Negative for visual disturbance (see optho q1y -- vision corrected to 20/20 with glasses).  Respiratory: Negative for cough, chest tightness and shortness of breath.   Cardiovascular: Negative for chest pain,  palpitations and leg swelling.  Gastrointestinal: Negative for abdominal pain, diarrhea, constipation and abdominal distention.  Genitourinary: Negative for urgency, frequency, decreased urine volume and difficulty urinating.  Musculoskeletal: Negative for back pain, arthralgias and gait problem.  Skin: Negative for color change, pallor and rash.  Neurological: Negative for dizziness, light-headedness, numbness and headaches.  Hematological: Negative for adenopathy. Does not bruise/bleed easily.  Psychiatric/Behavioral: Negative for suicidal ideas, confusion, sleep disturbance, self-injury, dysphoric mood, decreased concentration and agitation.       History Past Medical History  Diagnosis Date  . Allergy   . Stroke   . Sleep apnea   . Headache(784.0)   . Arthritis   . GERD (gastroesophageal reflux disease)   . Hypertension   . Hyperlipidemia   . Hearing loss   . Nasal congestion   . Trouble swallowing   . Blood in stool   . Chest pain   . Leg swelling   . Constipation   . Rectal pain   . Difficulty urinating   . Thrombosed hemorrhoids   . Brain cyst   . PONV (postoperative nausea and vomiting)   . Diabetes mellitus without complication     He has past surgical history that includes Cataract extraction; Inner ear surgery; Eye surgery ([place]); Inner ear surgery (1992); and surgery - right arm.   His family history includes Cancer (age of onset: 75) in his mother; Heart attack in his father; Heart disease in his father; Hypertension in his brother and father; Stroke in his father. There is no history of Hyperlipidemia or Diabetes.He reports that he has never smoked. He has never used smokeless tobacco. He reports that he does not drink alcohol or use illicit drugs.  Current Outpatient Prescriptions on File  Prior to Visit  Medication Sig Dispense Refill  . acetaminophen (TYLENOL) 160 MG/5ML liquid Take by mouth every 4 (four) hours as needed for fever.    Marland Kitchen  allopurinol (ZYLOPRIM) 100 MG tablet TAKE 1 TABLET (100 MG TOTAL) BY MOUTH DAILY. OFFICE VISIT DUE NOW WITH PCP 90 tablet 0  . cetirizine (ZYRTEC) 10 MG tablet Take 1 tablet (10 mg total) by mouth daily. 30 tablet 11  . diazepam (VALIUM) 5 MG tablet TAKE 1 TABLET 3 TIMES A DAY 90 tablet 0  . FIBER SELECT GUMMIES PO Take 1 tablet by mouth daily.    Marland Kitchen glucose blood (BAYER CONTOUR NEXT TEST) test strip Use as instructed 100 each 3  . glucose blood (FREESTYLE INSULINX TEST) test strip Use as instructed test 4 (four) times daily. Dx. 250.00 100 each 1  . HYDROcodone-acetaminophen (HYCET) 7.5-325 mg/15 ml solution TAKE 15 ML EVERY 6 HOURS AS NEEDED FOR PAIN 120 mL 0  . hydrocortisone (ANUSOL-HC) 25 MG suppository PLACE 1 SUPPOSITORY INTO RECTUM TWICE A DAY 12 suppository 2  . ibuprofen (ADVIL,MOTRIN) 100 MG/5ML suspension Take 200 mg by mouth every 4 (four) hours as needed for fever.    . Lancets (FREESTYLE) lancets Use as instructed test 4 (four) times daily. Dx. 250.00 100 each 1  . meclizine (ANTIVERT) 25 MG tablet Take 1 tablet (25 mg total) by mouth 3 (three) times daily as needed. 60 tablet 0  . metoprolol tartrate (LOPRESSOR) 25 MG tablet TAKE 1 TABLET TWICE A DAY, MAY CRUSH IN FOOD 180 tablet 1  . mometasone (ELOCON) 0.1 % ointment Apply topically 2 (two) times daily. 45 g 0  . ranitidine (ZANTAC) 15 MG/ML syrup TAKE 10 MLS (150 MG TOTAL) BY MOUTH 2 (TWO) TIMES DAILY AS NEEDED. FOR INDIGESTION. 300 mL 0  . triamcinolone (NASACORT) 55 MCG/ACT AERO nasal inhaler Place 2 sprays into the nose daily. 1 Inhaler 12  . venlafaxine XR (EFFEXOR-XR) 37.5 MG 24 hr capsule Take 1 capsule (37.5 mg total) by mouth daily. 30 capsule 5   No current facility-administered medications on file prior to visit.     Objective:  Objective Physical Exam  Constitutional: He is oriented to person, place, and time. He appears well-developed and well-nourished. No distress.  HENT:  Head: Normocephalic and atraumatic.    Right Ear: External ear normal.  Left Ear: External ear normal.  Nose: Nose normal.  Mouth/Throat: Oropharynx is clear and moist. No oropharyngeal exudate.  Eyes: Conjunctivae and EOM are normal. Pupils are equal, round, and reactive to light. Right eye exhibits no discharge. Left eye exhibits no discharge.  Neck: Normal range of motion. Neck supple. No JVD present. No thyromegaly present.  Cardiovascular: Normal rate, regular rhythm and intact distal pulses.  Exam reveals no gallop and no friction rub.   No murmur heard. Pulmonary/Chest: Effort normal and breath sounds normal. No respiratory distress. He has no wheezes. He has no rales. He exhibits no tenderness.  Abdominal: Soft. Bowel sounds are normal. He exhibits no distension and no mass. There is no tenderness. There is no rebound and no guarding.  Genitourinary: Prostate normal and penis normal. Guaiac positive stool.  Musculoskeletal: Normal range of motion. He exhibits no edema or tenderness.  Lymphadenopathy:    He has no cervical adenopathy.  Neurological: He is alert and oriented to person, place, and time. He displays normal reflexes. He exhibits normal muscle tone.  Skin: Skin is warm and dry. No rash noted. He is not diaphoretic. No erythema.  No pallor.  Psychiatric: He has a normal mood and affect. His behavior is normal. Judgment and thought content normal.   BP 132/90 mmHg  Pulse 77  Temp(Src) 98.9 F (37.2 C) (Oral)  Ht 5\' 10"  (1.778 m)  Wt 194 lb 6.4 oz (88.179 kg)  BMI 27.89 kg/m2  SpO2 97% Wt Readings from Last 3 Encounters:  10/03/14 194 lb 6.4 oz (88.179 kg)  08/25/14 194 lb 6 oz (88.168 kg)  05/26/14 193 lb 4 oz (87.658 kg)   .BP 132/90 mmHg  Pulse 77  Temp(Src) 98.9 F (37.2 C) (Oral)  Ht 5\' 10"  (1.778 m)  Wt 194 lb 6.4 oz (88.179 kg)  BMI 27.89 kg/m2  SpO2 97% General appearance: alert, cooperative, appears stated age and no distress Head: Normocephalic, without obvious abnormality,  atraumatic Eyes: conjunctivae/corneas clear. PERRL, EOM's intact. Fundi benign. Ears: normal TM's and external ear canals both ears Nose: Nares normal. Septum midline. Mucosa normal. No drainage or sinus tenderness. Throat: lips, mucosa, and tongue normal; teeth and gums normal Neck: no adenopathy, no carotid bruit, no JVD, supple, symmetrical, trachea midline and thyroid not enlarged, symmetric, no tenderness/mass/nodules Back: symmetric, no curvature. ROM normal. No CVA tenderness. Lungs: clear to auscultation bilaterally Chest wall: no tenderness Heart: regular rate and rhythm, S1, S2 normal, no murmur, click, rub or gallop Abdomen: soft, non-tender; bowel sounds normal; no masses,  no organomegaly Male genitalia: normal Rectal: normal tone, normal prostate, no masses or tenderness Extremities: extremities normal, atraumatic, no cyanosis or edema Pulses: 2+ and symmetric Skin: Skin color, texture, turgor normal. No rashes or lesions Lymph nodes: Cervical, supraclavicular, and axillary nodes normal. Neurologic: Alert and oriented X 3, normal strength and tone. Normal symmetric reflexes. Normal coordination and gait Psych--no depression, no anxiety   Lab Results  Component Value Date   WBC 14.4* 11/18/2013   HGB 14.9 11/18/2013   HCT 41.9 11/18/2013   PLT 340 11/18/2013   GLUCOSE 113* 11/18/2013   CHOL 202* 06/03/2013   TRIG 95.0 06/03/2013   HDL 35.80* 06/03/2013   LDLDIRECT 148.2 06/03/2013   LDLCALC 84 09/17/2012   ALT 22 06/03/2013   AST 21 06/03/2013   NA 137 11/18/2013   K 3.6 11/18/2013   CL 100 11/18/2013   CREATININE 1.46* 11/18/2013   BUN 18 11/18/2013   CO2 23 11/18/2013   TSH 2.43 09/17/2012   PSA 1.12 11/03/2011   HGBA1C 6.6* 05/26/2014   MICROALBUR 1.1 09/17/2012    Dg Chest 2 View  08/25/2014   CLINICAL DATA:  Initial evaluation chronic cough, COPD  EXAM: CHEST  2 VIEW  COMPARISON:  None.  FINDINGS: Hyperinflation, mild, consistent with mild COPD. Heart  size and vascular pattern are normal. No consolidation infiltrate or effusion.  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   On: 08/25/2014 17:04     Assessment & Plan:  Plan I have discontinued Mr. Mcclure multivitamin, promethazine, predniSONE, clarithromycin, and hydrOXYzine. I am also having him start on hydrOXYzine. Additionally, I am having him maintain his ibuprofen, glucose blood, freestyle, FIBER SELECT GUMMIES PO, metoprolol tartrate, mometasone, glucose blood, cetirizine, diazepam, venlafaxine XR, acetaminophen, meclizine, triamcinolone, allopurinol, HYDROcodone-acetaminophen, hydrocortisone, and ranitidine.  Meds ordered this encounter  Medications  . hydrOXYzine (ATARAX/VISTARIL) 25 MG tablet    Sig: Take 1 tablet (25 mg total) by mouth every 8 (eight) hours as needed for anxiety or itching.    Dispense:  90 tablet  Refill:  1    Problem List Items Addressed This Visit    None    Visit Diagnoses    Itching    -  Primary    Relevant Medications    hydrOXYzine (ATARAX/VISTARIL) 25 MG tablet    Other Relevant Orders    POCT urinalysis dipstick    TSH    DM (diabetes mellitus) type II uncontrolled with eye manifestation        Relevant Orders    Hemoglobin A1c    Hepatic function panel    Lipid panel    Microalbumin / creatinine urine ratio    POCT urinalysis dipstick    TSH    Essential hypertension        Relevant Orders    Basic metabolic panel    CBC with Differential/Platelet    POCT urinalysis dipstick    TSH    Preventative health care        Relevant Orders    PSA       Follow-up: Return in about 3 months (around 01/02/2015), or if symptoms worsen or fail to improve, for f/u .  [name], DO

## 2014-10-04 ENCOUNTER — Ambulatory Visit (INDEPENDENT_AMBULATORY_CARE_PROVIDER_SITE_OTHER): Payer: BLUE CROSS/BLUE SHIELD | Admitting: Pulmonary Disease

## 2014-10-04 ENCOUNTER — Encounter: Payer: Self-pay | Admitting: Pulmonary Disease

## 2014-10-04 ENCOUNTER — Encounter: Payer: Self-pay | Admitting: Nurse Practitioner

## 2014-10-04 VITALS — BP 152/80 | HR 85 | Temp 97.9°F | Ht 70.0 in | Wt 197.2 lb

## 2014-10-04 DIAGNOSIS — R053 Chronic cough: Secondary | ICD-10-CM

## 2014-10-04 DIAGNOSIS — R0602 Shortness of breath: Secondary | ICD-10-CM

## 2014-10-04 DIAGNOSIS — R05 Cough: Secondary | ICD-10-CM

## 2014-10-04 LAB — POCT URINALYSIS DIPSTICK
Bilirubin, UA: NEGATIVE
Blood, UA: NEGATIVE
Glucose, UA: NEGATIVE
Ketones, UA: NEGATIVE
Leukocytes, UA: NEGATIVE
Nitrite, UA: NEGATIVE
Spec Grav, UA: 1.02
Urobilinogen, UA: 0.2
pH, UA: 7

## 2014-10-04 LAB — LIPID PANEL
Cholesterol: 180 mg/dL (ref 0–200)
HDL: 32.9 mg/dL — ABNORMAL LOW (ref >39.00)
LDL Cholesterol: 122 mg/dL — ABNORMAL HIGH (ref 0–99)
NonHDL: 147.1
Total CHOL/HDL Ratio: 5
Triglycerides: 128 mg/dL (ref 0.0–149.0)
VLDL: 25.6 mg/dL (ref 0.0–40.0)

## 2014-10-04 LAB — TSH: TSH: 2.43 u[IU]/mL (ref 0.35–4.50)

## 2014-10-04 LAB — BASIC METABOLIC PANEL
BUN: 13 mg/dL (ref 6–23)
CO2: 30 mEq/L (ref 19–32)
Calcium: 9.6 mg/dL (ref 8.4–10.5)
Chloride: 101 mEq/L (ref 96–112)
Creatinine, Ser: 1.17 mg/dL (ref 0.40–1.50)
GFR: 69.02 mL/min (ref >60.00)
Glucose, Bld: 109 mg/dL — ABNORMAL HIGH (ref 70–99)
Potassium: 3.6 mEq/L (ref 3.5–5.1)
Sodium: 138 mEq/L (ref 135–145)

## 2014-10-04 LAB — CBC WITH DIFFERENTIAL/PLATELET
Basophils Absolute: 0.1 10*3/uL (ref 0.0–0.1)
Basophils Relative: 0.8 % (ref 0.0–3.0)
Eosinophils Absolute: 0.2 10*3/uL (ref 0.0–0.7)
Eosinophils Relative: 1.9 % (ref 0.0–5.0)
HCT: 43 % (ref 39.0–52.0)
Hemoglobin: 14.6 g/dL (ref 13.0–17.0)
Lymphocytes Relative: 18.5 % (ref 12.0–46.0)
Lymphs Abs: 1.9 10*3/uL (ref 0.7–4.0)
MCHC: 34 g/dL (ref 30.0–36.0)
MCV: 87.1 fl (ref 78.0–100.0)
Monocytes Absolute: 0.5 10*3/uL (ref 0.1–1.0)
Monocytes Relative: 5.2 % (ref 3.0–12.0)
Neutro Abs: 7.7 10*3/uL (ref 1.4–7.7)
Neutrophils Relative %: 73.6 % (ref 43.0–77.0)
Platelets: 378 10*3/uL (ref 150.0–400.0)
RBC: 4.94 Mil/uL (ref 4.22–5.81)
RDW: 14.6 % (ref 11.5–15.5)
WBC: 10.4 10*3/uL (ref 4.0–10.5)

## 2014-10-04 LAB — HEPATITIS C ANTIBODY: HCV Ab: NEGATIVE

## 2014-10-04 LAB — HEPATIC FUNCTION PANEL
ALT: 17 U/L (ref 0–53)
AST: 14 U/L (ref 0–37)
Albumin: 4.5 g/dL (ref 3.5–5.2)
Alkaline Phosphatase: 79 U/L (ref 39–117)
Bilirubin, Direct: 0.1 mg/dL (ref 0.0–0.3)
Total Bilirubin: 0.6 mg/dL (ref 0.2–1.2)
Total Protein: 7.1 g/dL (ref 6.0–8.3)

## 2014-10-04 LAB — MICROALBUMIN / CREATININE URINE RATIO
Creatinine,U: 199.4 mg/dL
Microalb Creat Ratio: 1.8 mg/g (ref 0.0–30.0)
Microalb, Ur: 3.5 mg/dL — ABNORMAL HIGH (ref 0.0–1.9)

## 2014-10-04 LAB — HEMOGLOBIN A1C: Hgb A1c MFr Bld: 6.8 % — ABNORMAL HIGH (ref 4.6–6.5)

## 2014-10-04 LAB — PSA: PSA: 1.01 ng/mL (ref 0.10–4.00)

## 2014-10-04 MED ORDER — AZELASTINE HCL 0.15 % NA SOLN: NASAL | Status: DC

## 2014-10-04 NOTE — Assessment & Plan Note (Signed)
The patient has a chronic cough but I suspect is more upper airway in origin than lower. His chest is clear to auscultation, and a chest x-ray shows no acute process. He has no airflow obstruction on spirometry today. He does have some ongoing postnasal drip, and has intermittent reflux symptoms for which he takes Zantac. I would like to treat him more aggressively for these issues, and see if he sees improvement. I have reviewed various behavioral therapies that will help with cyclical upper airway coughing, but unfortunately he has issues with chewing gum or using hard candy to prevent cough because of his recent strokes. I would like to see him back in 3 weeks to see if there is improvement, and if not, then I would consider a swallowing evaluation with speech therapy.

## 2014-10-04 NOTE — Progress Notes (Signed)
   Subjective:    Patient ID: Chase [name], male    DOB: 1960/08/14, 54 y.o.   MRN: [id number]  HPI The patient is a 54 year old male who I've been asked to see for a chronic cough. The patient has had an issue with this off and on for the last one year, but thinks that it may have gotten worse. It is primarily dry and hacking in nature, and he rarely ever produces mucus. It is worse with laughter, prolonged conversation, eating hot or cold foods, and also with exercise. He will sometimes have cough paroxysms which lead to chest soreness. 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 These did not happen acutely, but rather presented on MRI. The patient has never had a speech evaluation, and tells me that he has to be careful with eating. If he does not, he has cough and choking with liquids and solids. He does have a lot of sinus issues, and has recently been seen by otolaryngology and not felt to have sinus disease. It should be noted they did not do an upper airway exam of his larynx. He does have postnasal drip. He also has issues with reflux disease for which she takes Zantac liquid as needed. He has difficulties taking pills of any type. The patient has no history of pulmonary disease, and has never smoked, but does feel that he has dyspnea on exertion that is a little more than his usual baseline. He has had a recent chest x-ray that is been reviewed and shows mild hyperinflation.   Review of Systems  Constitutional: Negative for fever and unexpected weight change.  HENT: Positive for congestion, dental problem, ear pain, sneezing, sore throat and trouble swallowing. Negative for nosebleeds, postnasal drip, rhinorrhea and sinus pressure.   Eyes: Negative for redness and itching.  Respiratory: Positive for cough and shortness of breath. Negative for chest tightness and wheezing.   Cardiovascular: Positive for  chest pain and leg swelling. Negative for palpitations.  Gastrointestinal: Positive for abdominal pain. Negative for nausea and vomiting.  Genitourinary: Negative for dysuria.  Musculoskeletal: Positive for arthralgias. Negative for joint swelling.  Skin: Negative for rash.  Neurological: Positive for headaches.  Hematological: Does not bruise/bleed easily.  Psychiatric/Behavioral: Positive for dysphoric mood. The patient is nervous/anxious.        Objective:   Physical Exam Constitutional:  Well developed, no acute distress  HENT:  Nares patent without discharge  Oropharynx without exudate, palate and uvula are normal  Eyes:  Perrla, eomi, no scleral icterus  Neck:  No JVD, no TMG  Cardiovascular:  Normal rate, regular rhythm, no rubs or gallops.  No murmurs        Intact distal pulses  Pulmonary :  Normal breath sounds, no stridor or respiratory distress   No rales, rhonchi, or wheezing  Abdominal:  Soft, nondistended, bowel sounds present.  No tenderness noted.   Musculoskeletal:  No lower extremity edema noted.  Lymph Nodes:  No cervical lymphadenopathy noted  Skin:  No cyanosis noted  Neurologic:  Alert, appropriate, moves all 4 extremities without obvious deficit.         Assessment & Plan:

## 2014-10-04 NOTE — Patient Instructions (Addendum)
Take your zantac twice a day as prescribed everyday whether you think you need or not.   Will try astepro nasal spray, 2 each nostril am and pm until next visit. Avoid prolonged conversation, yelling, and no throat clearing (just drink something or swallow) We will need to keep in mind the possibility of occult aspiration going forward if you do not improve.  followup with me again in 3 weeks.

## 2014-10-06 ENCOUNTER — Telehealth: Payer: Self-pay | Admitting: Family Medicine

## 2014-10-06 NOTE — Telephone Encounter (Signed)
Caller name: [facility] Relation to pt: Call back number: Pharmacy:  Reason for call:   Called stating that they are not going to be able to see patient since he has [place] and we [place].

## 2014-10-06 NOTE — Telephone Encounter (Signed)
Patient does not have [employer], insurance card faxed to [facility]/awaiting to hear back from them

## 2014-10-09 ENCOUNTER — Other Ambulatory Visit: Payer: Self-pay

## 2014-10-09 MED ORDER — ONETOUCH DELICA LANCETS FINE MISC
Status: DC
Start: 2014-10-09 — End: 2015-10-31

## 2014-10-11 ENCOUNTER — Other Ambulatory Visit: Payer: Self-pay

## 2014-10-11 MED ORDER — ATORVASTATIN CALCIUM 10 MG PO TABS: 10.0000 mg | ORAL_TABLET | Freq: Every day | ORAL | Status: DC

## 2014-10-16 ENCOUNTER — Ambulatory Visit: Payer: BLUE CROSS/BLUE SHIELD | Admitting: Neurology

## 2014-10-17 ENCOUNTER — Encounter: Payer: Self-pay | Admitting: Neurology

## 2014-10-17 ENCOUNTER — Ambulatory Visit (INDEPENDENT_AMBULATORY_CARE_PROVIDER_SITE_OTHER): Payer: BLUE CROSS/BLUE SHIELD | Admitting: Neurology

## 2014-10-17 VITALS — BP 155/97 | HR 69 | Resp 18 | Ht 71.26 in | Wt 195.0 lb

## 2014-10-17 DIAGNOSIS — G4733 Obstructive sleep apnea (adult) (pediatric): Secondary | ICD-10-CM

## 2014-10-17 DIAGNOSIS — F05 Delirium due to known physiological condition: Secondary | ICD-10-CM

## 2014-10-17 DIAGNOSIS — I69320 Aphasia following cerebral infarction: Secondary | ICD-10-CM

## 2014-10-17 DIAGNOSIS — R410 Disorientation, unspecified: Secondary | ICD-10-CM | POA: Insufficient documentation

## 2014-10-17 DIAGNOSIS — I481 Persistent atrial fibrillation: Secondary | ICD-10-CM

## 2014-10-17 DIAGNOSIS — R51 Headache: Secondary | ICD-10-CM | POA: Diagnosis not present

## 2014-10-17 DIAGNOSIS — Z9989 Dependence on other enabling machines and devices: Secondary | ICD-10-CM

## 2014-10-17 DIAGNOSIS — R519 Headache, unspecified: Secondary | ICD-10-CM

## 2014-10-17 DIAGNOSIS — E348 Other specified endocrine disorders: Secondary | ICD-10-CM

## 2014-10-17 DIAGNOSIS — I4819 Other persistent atrial fibrillation: Secondary | ICD-10-CM

## 2014-10-17 HISTORY — DX: Headache, unspecified: R51.9

## 2014-10-17 HISTORY — DX: Obstructive sleep apnea (adult) (pediatric): G47.33

## 2014-10-17 HISTORY — DX: Disorientation, unspecified: R41.0

## 2014-10-17 HISTORY — DX: Other specified endocrine disorders: E34.8

## 2014-10-17 HISTORY — DX: Aphasia following cerebral infarction: I69.320

## 2014-10-17 MED ORDER — CYCLOBENZAPRINE HCL 10 MG PO TABS: 10.0000 mg | ORAL_TABLET | Freq: Every day | ORAL | Status: DC

## 2014-10-17 NOTE — Progress Notes (Signed)
[facility]  SLEEP CLINIC   Provider:  Dr Fatou Dunnigan Referring Provider: Izora Gala, MD Primary Care Physician:  [name], DO  Chief Complaint  Patient presents with  . Migraine    new patient, rm 10, alone    HPI:  Chase [name] is a 54 y.o. male here as a  revisit , originally seen from referral from Dr. Tomma Rakers for further Sleep evaluation.  She has been followed by Dr. Danton Sewer  before 2009, and was diagnosed by him as obstructive sleep apnea and placed on CPAP. He has been intermittently using CPAP, having tried many different marks at all were neither comfortable nor properly feeling. The last encounter was advanced on care was just last week I understand that he was again changed to a different miles which worked one night for him and has not since. Chase [name] has long-standing other medical problems, he has seen dr Erling Cruz - for headaches and memory loss.  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 Marca Ancona office. At one time he had an area of punctate subacute stroke in the left frontal parietal white matter on the MRI worsened 10/17/2011. 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 Dr. Erling Cruz  2007 . Performed  memory test which was at that time 28 or 30 points. The patient has not been working since September 2008 patient reports having had a lateral strokes which are both normal he but has never seen Dr. Leonie Man in followup.  He has long-standing hearing loss and in the past vertical, he has been followed by Dr. Thornell Mule  and Claria Dice. He has a history of irregular heart beats. He has degenerative disc disease at the cervical spine.  The patient does not endorse a regular bedtime, his sleep habits very. He tries to be and that at 2 AM he estimates his sleep time varies widely as well between 4 and 8 hours at night. Depending on his plans for the day appointments etc. he may arise  at 8 or much later, at noon. He snores loudly , and was in 2009  referred by Dr Joni Fears to a sleep study.     Today on 10-17-14 I have seen [name] today for a revisit after 2 years. Unfortunately we don't have access to his CPAP machine today which he forgot. He states that he is mostly here to be evaluated for migraine headaches :  Patient has meanwhile filed for disability successfully and is now covered by Medicaid. He is concerned that his headaches could be related to this cyst. And the has not had an MRI in the last 2 years. Usually we will obtain one every 2 years if symptoms arise. He continues to present his cognitive problems and with headaches. He has been in financial turmoil. He is followed by you and also told at [facility] and actually he was re-referred today by Dr. Melissa Montane. He does not have any sinus pathology according to ENT there was no clinical radiographic evidence of sinus disease. Also I have been seeing this patient for his sleep he is now referred to me as a general neurologist for the workup of chronic headaches. Dr. 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 Ringing in his ears is related to the nerve damage and bilateral hearing loss. There is no specific treatment for it. The CT of the sinuses was from  2014.  The patients mother had Huntington's Disease. He already has arrhythmia and strokes, his paternal family history for OSA and CVA is strong. His mother had Huntington's Disease.           Review of Systems: Out of a complete 14 system review, the patient complains of only the following symptoms, and all other reviewed systems are negative. Weight gain, fatigue, chest pain, palpitations, swelling in the leg, hearing loss, ringing in his ears, spinning sensation, trouble swallowing, rash, itching, molds, blurred vision, eye pain, short of breath, cough, wheezing, snoring, blood in the stool, constipation, urination problems, feeling hot or cold,  flushing, joint pain, cramps, aching muscles, allergies, no runny nose, death interest in activities, decreased energy, too much sleep, anxiety, depression, dizziness difficulty swallowing, slurred speech, weakness, numbness, headaches, memory loss and confusion insomnia.  History   Social History  . Marital Status: Single    Spouse Name: N/A  . Number of Children: 0  . Years of Education: N/A   Occupational History  . Not on file.   Social History Main Topics  . Smoking status: Never Smoker   . Smokeless tobacco: Never Used  . Alcohol Use: No     Comment: none  . Drug Use: No  . Sexual Activity: No   Other Topics Concern  . Not on file   Social History Narrative   No caffeine intake except for chocolate.    Family History  Problem Relation Age of Onset  . Breast cancer Mother 68    breast  . Stroke Father   . Heart disease Father   . Hypertension Father   . Heart attack Father   . Hypertension Brother   . Hyperlipidemia Neg Hx   . Diabetes Neg Hx     Past Medical History  Diagnosis Date  . Allergy   . Stroke   . Sleep apnea   . Headache(784.0)   . Arthritis   . GERD (gastroesophageal reflux disease)   . Hypertension   . Hyperlipidemia   . Hearing loss   . Nasal congestion   . Trouble swallowing   . Blood in stool   . Chest pain   . Leg swelling   . Constipation   . Rectal pain   . Difficulty urinating   . Thrombosed hemorrhoids   . Brain cyst   . PONV (postoperative nausea and vomiting)   . Diabetes mellitus without complication   . Stroke     Past Surgical History  Procedure Laterality Date  . Cataract extraction      x 3  . Inner ear surgery      rt ear  . Eye surgery  1968, 1985, 1987    cataracts  . Inner ear surgery  1992  . Surgery - right arm      1983    Current Outpatient Prescriptions  Medication Sig Dispense Refill  . acetaminophen (TYLENOL) 160 MG/5ML liquid Take by mouth every 4 (four) hours as needed for fever.    Marland Kitchen  allopurinol (ZYLOPRIM) 100 MG tablet TAKE 1 TABLET (100 MG TOTAL) BY MOUTH DAILY. OFFICE VISIT DUE NOW WITH PCP 90 tablet 0  . Azelastine HCl 0.15 % SOLN 2 sprays each nostril twice daily 30 mL 0  . cetirizine (ZYRTEC) 10 MG tablet Take 1 tablet (10 mg total) by mouth daily. 30 tablet 11  . diazepam (VALIUM) 5 MG tablet TAKE 1 TABLET 3 TIMES A DAY 90 tablet 0  . FIBER SELECT  GUMMIES PO Take 1 tablet by mouth daily.    Marland Kitchen glucose blood (ONETOUCH VERIO) test strip Check blood sugar four times a day. Dx. E11.9 Onetouch Verio test strips 100 each 12  . HYDROcodone-acetaminophen (HYCET) 7.5-325 mg/15 ml solution TAKE 15 ML EVERY 6 HOURS AS NEEDED FOR PAIN 120 mL 0  . hydrOXYzine (ATARAX/VISTARIL) 25 MG tablet Take 1 tablet (25 mg total) by mouth every 8 (eight) hours as needed for anxiety or itching. 90 tablet 1  . ibuprofen (ADVIL,MOTRIN) 100 MG/5ML suspension Take 200 mg by mouth every 4 (four) hours as needed for fever.    . Lancets (FREESTYLE) lancets Use as instructed test 4 (four) times daily. Dx. 250.00 100 each 1  . meclizine (ANTIVERT) 25 MG tablet Take 1 tablet (25 mg total) by mouth 3 (three) times daily as needed. 60 tablet 0  . metoprolol tartrate (LOPRESSOR) 25 MG tablet TAKE 1 TABLET TWICE A DAY, MAY CRUSH IN FOOD 180 tablet 1  . mometasone (ELOCON) 0.1 % ointment Apply topically 2 (two) times daily. 45 g 0  . ONETOUCH DELICA LANCETS FINE MISC Check blood sugar four times per day. Dx:E11.9 100 each 12  . ranitidine (ZANTAC) 15 MG/ML syrup TAKE 10 MLS (150 MG TOTAL) BY MOUTH 2 (TWO) TIMES DAILY AS NEEDED. FOR INDIGESTION. 300 mL 0  . triamcinolone (NASACORT) 55 MCG/ACT AERO nasal inhaler Place 2 sprays into the nose daily. 1 Inhaler 12  . venlafaxine XR (EFFEXOR-XR) 37.5 MG 24 hr capsule Take 1 capsule (37.5 mg total) by mouth daily. 30 capsule 5  . atorvastatin (LIPITOR) 10 MG tablet Take 1 tablet (10 mg total) by mouth daily. (Patient not taking: Reported on 10/17/2014) 30 tablet 2  .  hydrocortisone (ANUSOL-HC) 25 MG suppository PLACE 1 SUPPOSITORY INTO RECTUM TWICE A DAY (Patient not taking: Reported on 10/17/2014) 12 suppository 2   No current facility-administered medications for this visit.    Allergies as of 10/17/2014 - Review Complete 10/17/2014  Allergen Reaction Noted  . Amoxicillin Hives, Itching, and Other (See Comments) 12/23/2006  . Flonase [fluticasone propionate]  08/25/2014  . Terfenadine  12/09/2007    Vitals: BP 155/97 mmHg  Pulse 69  Resp 18  Ht 5' 11.26" (1.81 m)  Wt 195 lb (88.451 kg)  BMI 27.00 kg/m2 Last Weight:  Wt Readings from Last 1 Encounters:  10/17/14 195 lb (88.451 kg)   Last Height:   Ht Readings from Last 1 Encounters:  10/17/14 5' 11.26" (1.81 m)   Vision Screening:  See vitals  Physical exam:  General: The patient is awake, alert and appears not in acute distress. The patient is well groomed. Head: Normocephalic, atraumatic,  . Neck is supple. Mallampati 1- 2 , TMJ evident, no nasal septal deviation, retrognathia is present. Rhinitis , treated with Zyrtec, Astilin. . , neck circumference:15 inches . Cardiovascular:  Regular rate and rhythm , without  murmurs or carotid bruit, and without distended neck veins. Respiratory: Lungs are clear to auscultation. Skin:  Without evidence of edema,  He has facial redness,  Trunk: BMI is normal .  Neurologic exam : The patient is awake and alert, oriented to place and time.  Memory subjective described as impaired .  There is a reduced attention span & concentration ability. Speech is non - fluent with difficulties to find words,  he is frustrated and angry.   Cranial nerves: Pupils - disrounded on the right, Right sided vision loss, status post bilateral cataract, right pupil is blown ,  non reactive ,   Funduscopic exam without  evidence of pallor or edema.Marland Kitchen Hearing loss - right sided  deficit is evident  While on  hearing aids.  Hearing to finger rub intact.  Facial sensation  intact to fine touch. Facial motor strength is symmetric and tongue and uvula move midline.  Motor exam:  5/5 Normal tone and normal muscle bulk and symmetric normal strength in all extremities.  Sensory:  Fine touch, pinprick and vibration were tested in all extremities.  Proprioception is tested in the upper extremities  normal.  Coordination: Rapid alternating movements in the fingers/hands is tested and normal. Finger-to-nose maneuver tested . Gait and station: Patient walks without assistive device and is able to climb up to the exam table. Strength within normal limits.    Deep tendon reflexes: in the  upper and lower extremities are symmetric and intact. .   Assessment:  After physical and neurologic examination, review of laboratory studies, imaging, neurophysiology testing and pre-existing records, assessment :    Plan:  Treatment plan and additional workup   #1  treatment of obstructive sleep apnea. 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  he has a low Mallampati, and retrognathia, especially loud snoring when in supine position. Clinically and by physical exam, I would have recommended to sleep on the side.  The patient assured me that he can because of physical discomfort not sleep in nonsupine position.  A dental treatment such as an advanced mandibular device will not be suitable for patient his TMJ. An ENT intervention is not necessary as the patient has a low-grade Mallampati.  It remains to use CPAP with a nasal pillow and to try to avoid a chin strap.  I will be happy to follow his sleep related CPAP related compliance.  The patient did NOT BRING THE CPAP MACHINE ) (See my note form 2014 : I will follow this gentleman for his sleep related problems if he is compliant with my treatment  , but will not be his general neurologist.)  2) the patient  reports that his headache is worsened with ear pressure that there almost like a vice around the top of his head , up and all the way around from forehead to occiput is very tender at the forehead and at the  top of the head. The area is sensitive to touch.  Maybe sometimes when his headaches are associated with nausea but is not a regular finding. He is not sure if bright lights contribute to discomfort -in other words we are not sure about photophobia.  Recommend muscle relaxant , this may help his TMJ, too. There is anecdotal evidence of carbamazepine being beneficial.  He has dysphaia and likes a liquid.  I will order an MRI of the brain , he just had lab tests.  If his response to [facility] is muted, I will ask his PCP , Dr Etter Sjogren , to seek another neurologist opinion.

## 2014-10-17 NOTE — Patient Instructions (Signed)

## 2014-10-19 ENCOUNTER — Encounter: Payer: Self-pay | Admitting: Nurse Practitioner

## 2014-10-19 ENCOUNTER — Ambulatory Visit (INDEPENDENT_AMBULATORY_CARE_PROVIDER_SITE_OTHER): Payer: BLUE CROSS/BLUE SHIELD | Admitting: Nurse Practitioner

## 2014-10-19 VITALS — BP 150/90 | HR 84 | Ht 70.0 in | Wt 195.0 lb

## 2014-10-19 DIAGNOSIS — K648 Other hemorrhoids: Secondary | ICD-10-CM

## 2014-10-19 MED ORDER — HYDROCORTISONE ACETATE 25 MG RE SUPP: RECTAL | Status: DC

## 2014-10-19 NOTE — Patient Instructions (Signed)
We have sent the following medications to your pharmacy for you to pick up at your convenience: Anusol  Call if you want to set up banding after you talk to your insurance.

## 2014-10-20 NOTE — Progress Notes (Addendum)
HPI :  Patient is a 54 year old male referred by PCP or hemorrhoids / rectal bleeding. Patient began having hemorrhoidal pain and bleeding about two years ago. He has used prescription and OTC hemorrhoid medications without improvement. Normal colonoscopy by [facility] in 2010. Patient has a history of CVA and states he has problems remembering things now.   Past Medical History  Diagnosis Date  . Allergy   . Stroke   . Sleep apnea   . Arthritis   . GERD (gastroesophageal reflux disease)   . Hypertension   . Hyperlipidemia   . Hearing loss   . Constipation   . Thrombosed hemorrhoids   . Brain cyst   . PONV (postoperative nausea and vomiting)   . Diabetes mellitus without complication     Family History  Problem Relation Age of Onset  . Breast cancer Mother 73    breast  . Stroke Father   . Heart disease Father   . Hypertension Father   . Heart attack Father   . Hypertension Brother   . Hyperlipidemia Neg Hx   . Diabetes Neg Hx    History  Substance Use Topics  . Smoking status: Never Smoker   . Smokeless tobacco: Never Used  . Alcohol Use: No     Comment: none   Current Outpatient Prescriptions  Medication Sig Dispense Refill  . acetaminophen (TYLENOL) 160 MG/5ML liquid Take by mouth every 4 (four) hours as needed for fever.    Marland Kitchen allopurinol (ZYLOPRIM) 100 MG tablet TAKE 1 TABLET (100 MG TOTAL) BY MOUTH DAILY. OFFICE VISIT DUE NOW WITH PCP 90 tablet 0  . atorvastatin (LIPITOR) 10 MG tablet Take 1 tablet (10 mg total) by mouth daily. 30 tablet 2  . Azelastine HCl 0.15 % SOLN 2 sprays each nostril twice daily 30 mL 0  . cetirizine (ZYRTEC) 10 MG tablet Take 1 tablet (10 mg total) by mouth daily. 30 tablet 11  . cyclobenzaprine (FLEXERIL) 10 MG tablet Take 1 tablet (10 mg total) by mouth at bedtime. 30 tablet 0  . diazepam (VALIUM) 5 MG tablet TAKE 1 TABLET 3 TIMES A DAY 90 tablet 0  . FIBER SELECT GUMMIES PO Take 1 tablet by mouth daily.    Marland Kitchen glucose blood  (ONETOUCH VERIO) test strip Check blood sugar four times a day. Dx. E11.9 Onetouch Verio test strips 100 each 12  . HYDROcodone-acetaminophen (HYCET) 7.5-325 mg/15 ml solution TAKE 15 ML EVERY 6 HOURS AS NEEDED FOR PAIN 120 mL 0  . hydrocortisone (ANUSOL-HC) 25 MG suppository PLACE 1 SUPPOSITORY INTO RECTUM TWICE A DAY 20 suppository 2  . hydrOXYzine (ATARAX/VISTARIL) 25 MG tablet Take 1 tablet (25 mg total) by mouth every 8 (eight) hours as needed for anxiety or itching. 90 tablet 1  . ibuprofen (ADVIL,MOTRIN) 100 MG/5ML suspension Take 200 mg by mouth every 4 (four) hours as needed for fever.    . Lancets (FREESTYLE) lancets Use as instructed test 4 (four) times daily. Dx. 250.00 100 each 1  . meclizine (ANTIVERT) 25 MG tablet Take 1 tablet (25 mg total) by mouth 3 (three) times daily as needed. 60 tablet 0  . metoprolol tartrate (LOPRESSOR) 25 MG tablet TAKE 1 TABLET TWICE A DAY, MAY CRUSH IN FOOD 180 tablet 1  . mometasone (ELOCON) 0.1 % ointment Apply topically 2 (two) times daily. 45 g 0  . ONETOUCH DELICA LANCETS FINE MISC Check blood sugar four times per day. Dx:E11.9 100 each 12  .  ranitidine (ZANTAC) 15 MG/ML syrup TAKE 10 MLS (150 MG TOTAL) BY MOUTH 2 (TWO) TIMES DAILY AS NEEDED. FOR INDIGESTION. 300 mL 0  . triamcinolone (NASACORT) 55 MCG/ACT AERO nasal inhaler Place 2 sprays into the nose daily. 1 Inhaler 12  . venlafaxine XR (EFFEXOR-XR) 37.5 MG 24 hr capsule Take 1 capsule (37.5 mg total) by mouth daily. 30 capsule 5   No current facility-administered medications for this visit.   Allergies  Allergen Reactions  . Amoxicillin Hives, Itching and Other (See Comments)    White tongue; ? hives  . Flonase [Fluticasone Propionate]     Nasal Sores  . Terfenadine     ? reaction     Review of Systems: Multiple positives: allergy, anxeity, arthritis, back pain, vision changes, confusion, cough, depression, fatigue, fever, hearing problems, itching, muscle pain, night sweats, SOB, sore  throat, swelling of feet, urination pain, urine leakage, voices changes, skin rash, headaches, memory loss. All other systems reviewed and negative except where noted in HPI.   Physical Exam: BP 150/90 mmHg  Pulse 84  Ht 5\' 10"  (1.778 m)  Wt 195 lb (88.451 kg)  BMI 27.98 kg/m2 Constitutional: Pleasant,well-developed, white male in no acute distress. HEENT: Normocephalic and atraumatic. Conjunctivae are normal. No scleral icterus. Neck supple.  Cardiovascular: Normal rate, regular rhythm.  Pulmonary/chest: Effort normal and breath sounds normal. No wheezing, rales or rhonchi. Abdominal: Soft, nondistended, nontender. Bowel sounds active throughout. There are no masses palpable. No hepatomegaly. Rectal: inflamed internal hemorrhoids protruding from anus. No masses felt on DRE.  Extremities: no edema Lymphadenopathy: No cervical adenopathy noted. Neurological: Alert and oriented to person place and time. Skin: Skin is warm and dry. No rashes noted. Psychiatric: Normal mood and affect. Behavior is normal.   ASSESSMENT AND PLAN:  54 year old male with hemorrhoids and rectal bleeding, present for two years. Normal colonoscopy with [facility] in 2010. No anemia, recent hgb 14.9, normal MCV .  Offered banding but he has a high (unmet) deductible so cost would be out of pocket for him. He has Medicaid in addition to [facility] but that still doesn't help him with deductible. Literature on hemorrhoidal banding given to patient. We discussed the treatment in detail. For now will treat with Anusol HC BID for 10 full days. Avoid constipation, he isn't struggling with that at present.    Total time spent with patient was 30 minutes and, greater than 50% of this time was spent discussing treatment options / banding procedure.   Cc: [name], Md  Addendum: Reviewed and agree with initial management. Would very likely benefit from hemorrhoidal banding though patient does not feel he can afford  this currently. If he wishes to proceed with hemorrhoidal banding in the future can be scheduled Periodic monitoring of blood counts if still ongoing bleeding Jerene Bears, MD   Addendum: Records from [facility] received. Patient had a normal complete colonoscopy Sept 2011 for rectal bleeding. EGD (normal) done same day for dysphagia.Marland Kitchen

## 2014-10-22 ENCOUNTER — Other Ambulatory Visit: Payer: Self-pay | Admitting: Family Medicine

## 2014-10-22 DIAGNOSIS — K648 Other hemorrhoids: Secondary | ICD-10-CM

## 2014-10-22 HISTORY — DX: Other hemorrhoids: K64.8

## 2014-10-25 ENCOUNTER — Telehealth: Payer: Self-pay | Admitting: Family Medicine

## 2014-10-25 ENCOUNTER — Ambulatory Visit (INDEPENDENT_AMBULATORY_CARE_PROVIDER_SITE_OTHER): Payer: BLUE CROSS/BLUE SHIELD | Admitting: Pulmonary Disease

## 2014-10-25 ENCOUNTER — Other Ambulatory Visit (HOSPITAL_COMMUNITY): Payer: Self-pay | Admitting: Pulmonary Disease

## 2014-10-25 ENCOUNTER — Encounter: Payer: Self-pay | Admitting: Pulmonary Disease

## 2014-10-25 ENCOUNTER — Telehealth: Payer: Self-pay | Admitting: Pulmonary Disease

## 2014-10-25 VITALS — BP 142/76 | HR 80 | Temp 98.0°F | Ht 70.0 in | Wt 196.4 lb

## 2014-10-25 DIAGNOSIS — R05 Cough: Secondary | ICD-10-CM | POA: Diagnosis not present

## 2014-10-25 DIAGNOSIS — R1314 Dysphagia, pharyngoesophageal phase: Secondary | ICD-10-CM

## 2014-10-25 DIAGNOSIS — R053 Chronic cough: Secondary | ICD-10-CM

## 2014-10-25 NOTE — Telephone Encounter (Signed)
Called and spoke to. Pt stated when he drinks water some times he gets indigestion. Per [facility] notes he is setting pt up for swallow eval and we can wait till after results come back. Advised pt the indigestion could be related to swallowing issues. Pt verbalized understanding and denied any further questions or concerns at this time.

## 2014-10-25 NOTE — Progress Notes (Signed)
   Subjective:    Patient ID: Chase [name], male    DOB: 11-17-1960, 54 y.o.   MRN: [id number]  HPI The patient comes in today for follow-up of his chronic cough, which is felt to be upper airway in origin. At the last visit, I started him on Astepro for postnasal drip, and asked him to take his Zantac on a regular basis twice a day to see if this would help. He believes the nasal spray has definitely helped, but he never did take the Zantac as prescribed. I also asked him to work on behavioral therapies which he has, but does have some throat clearing during our visit today. I was concerned about the possibility of occult aspiration given his speech issues after his stroke. He does think that he has issues with liquids, and he is going to need a speech evaluation.   Review of Systems  Constitutional: Negative for fever and unexpected weight change.  HENT: Positive for congestion, postnasal drip, sore throat and trouble swallowing. Negative for dental problem, ear pain, nosebleeds, rhinorrhea, sinus pressure and sneezing.   Eyes: Negative for redness and itching.  Respiratory: Positive for cough and shortness of breath. Negative for chest tightness and wheezing.   Cardiovascular: Negative for palpitations and leg swelling.  Gastrointestinal: Negative for nausea and vomiting.  Genitourinary: Negative for dysuria.  Musculoskeletal: Negative for joint swelling.  Skin: Negative for rash.  Neurological: Negative for headaches.  Hematological: Does not bruise/bleed easily.  Psychiatric/Behavioral: Negative for dysphoric mood. The patient is not nervous/anxious.        Objective:   Physical Exam Well-developed male in no acute distress Nose without purulence or discharge noted Neck without lymphadenopathy or thyromegaly Lower extremities without edema, no cyanosis Alert and oriented, moves all 4 extremities.       Assessment & Plan:

## 2014-10-25 NOTE — Assessment & Plan Note (Signed)
The patient's cough is somewhat improved since he has been on Astepro for postnasal drip, but he never did take the H2 blocker twice a day to see if reflux may be contributing as well. I have asked him to stay on the nasal spray and the H2 blocker as directed, and we will go ahead and set him up for a speech evaluation to rule out occult aspiration. The patient is agreeable to this approach. I have also encouraged him to continue with his behavioral therapies, especially ongoing throat clearing.

## 2014-10-25 NOTE — Patient Instructions (Signed)
Continue on astepro nasal spray for now.  Let us know if you need refills. Go ahead and take zantac twice a day religiously while we are working up the cause of your cough. Continue behavioral therapies, especially no throat clearing.  Will arrange for swallowing evaluation by speech therapy to see if there is an aspiration problem.  Will call you with results.

## 2014-10-25 NOTE — Telephone Encounter (Signed)
Relation to pt: [facility]  Call back number:  Pharmacy: [id number] - [place], [place] [phone] (Phone) [phone] (Fax)         Reason for call:  As per pharmacy triamcinolone (NASACORT) 55 MCG/ACT AERO nasal inhaler is D/C by manufacturer.

## 2014-10-26 ENCOUNTER — Other Ambulatory Visit: Payer: Self-pay | Admitting: Family Medicine

## 2014-10-26 ENCOUNTER — Ambulatory Visit
Admission: RE | Admit: 2014-10-26 | Discharge: 2014-10-26 | Disposition: A | Discharge status: Home / Self Care | Payer: BLUE CROSS/BLUE SHIELD | Source: Ambulatory Visit | Attending: Neurology | Admitting: Neurology

## 2014-10-26 ENCOUNTER — Other Ambulatory Visit: Payer: Self-pay | Admitting: Pulmonary Disease

## 2014-10-26 DIAGNOSIS — F05 Delirium due to known physiological condition: Secondary | ICD-10-CM

## 2014-10-26 DIAGNOSIS — R519 Headache, unspecified: Secondary | ICD-10-CM

## 2014-10-26 DIAGNOSIS — G4733 Obstructive sleep apnea (adult) (pediatric): Secondary | ICD-10-CM

## 2014-10-26 DIAGNOSIS — Z9989 Dependence on other enabling machines and devices: Secondary | ICD-10-CM

## 2014-10-26 DIAGNOSIS — R410 Disorientation, unspecified: Secondary | ICD-10-CM

## 2014-10-26 DIAGNOSIS — I69320 Aphasia following cerebral infarction: Secondary | ICD-10-CM

## 2014-10-26 DIAGNOSIS — R51 Headache: Secondary | ICD-10-CM

## 2014-10-26 DIAGNOSIS — E348 Other specified endocrine disorders: Secondary | ICD-10-CM

## 2014-10-26 DIAGNOSIS — I4819 Other persistent atrial fibrillation: Secondary | ICD-10-CM

## 2014-10-26 NOTE — Telephone Encounter (Signed)
Please advise      KP 

## 2014-10-26 NOTE — Telephone Encounter (Signed)
Nasal sprays are otc-- pt can get any of the others

## 2014-10-26 NOTE — Telephone Encounter (Signed)
Last seen 10/03/14 and filled 08/24/14 #90 UDS 09/02/12 low risk   Please advise      KP

## 2014-10-26 NOTE — Telephone Encounter (Signed)
The pharmacy has been notified.     KP

## 2014-10-31 ENCOUNTER — Other Ambulatory Visit (HOSPITAL_COMMUNITY): Payer: BLUE CROSS/BLUE SHIELD

## 2014-10-31 ENCOUNTER — Ambulatory Visit (HOSPITAL_COMMUNITY): Payer: BLUE CROSS/BLUE SHIELD

## 2014-10-31 ENCOUNTER — Other Ambulatory Visit: Payer: Self-pay | Admitting: Pulmonary Disease

## 2014-11-06 ENCOUNTER — Ambulatory Visit (HOSPITAL_COMMUNITY)
Admission: RE | Admit: 2014-11-06 | Discharge: 2014-11-06 | Disposition: A | Discharge status: Home / Self Care | Payer: BLUE CROSS/BLUE SHIELD | Source: Ambulatory Visit | Attending: Pulmonary Disease | Admitting: Pulmonary Disease

## 2014-11-06 ENCOUNTER — Ambulatory Visit (HOSPITAL_COMMUNITY): Payer: BLUE CROSS/BLUE SHIELD

## 2014-11-06 ENCOUNTER — Other Ambulatory Visit: Payer: Self-pay | Admitting: Family Medicine

## 2014-11-13 ENCOUNTER — Other Ambulatory Visit: Payer: Self-pay | Admitting: Neurology

## 2014-11-15 ENCOUNTER — Telehealth: Payer: Self-pay

## 2014-11-15 DIAGNOSIS — I63111 Cerebral infarction due to embolism of right vertebral artery: Secondary | ICD-10-CM

## 2014-11-15 NOTE — Telephone Encounter (Signed)
Pt was informed of MRI results, he questions concerning new region infarct found.  Will have Dr. Brett Fairy to call patient.

## 2014-11-15 NOTE — Telephone Encounter (Signed)
I discussed the MRI results with the patient reports understandably concerned that he has ongoing stroke activity. His youngest stroke seems to be acquired with in the last 2 years. He also had multiple corns and I explained that the high risk factor is hypertension. Dr. Erling Cruz had followed him for strokes in the past as well. He would like to have an appointment with a stroke specialist either Dr. Willaim Rayas or Dr. Derryl Harbor to discuss secondary probe stroke prevention and also his overall vascular helth. I will make this referral for him. CD

## 2014-11-16 DIAGNOSIS — Z8673 Personal history of transient ischemic attack (TIA), and cerebral infarction without residual deficits: Secondary | ICD-10-CM

## 2014-11-16 HISTORY — DX: Personal history of transient ischemic attack (TIA), and cerebral infarction without residual deficits: Z86.73

## 2014-11-22 ENCOUNTER — Other Ambulatory Visit: Payer: Self-pay | Admitting: Family Medicine

## 2014-11-29 ENCOUNTER — Telehealth: Payer: Self-pay | Admitting: Neurology

## 2014-11-29 NOTE — Telephone Encounter (Signed)
Chase [name] w/ Dr Al Corpus and Rectal surgeon. She is inquiring  patient is being considered for surgery for hemoridectomy to be under local anesthetic with moninitered anesthesia . Needs clearance on neurological stand point. Please call and advise. She can be reached at [phone].

## 2014-11-29 NOTE — Telephone Encounter (Signed)
Retured Chase [name]'s phone call and left her a message telling her that Dr. Brett Fairy gave clearance neurologically for his surgery.

## 2014-12-01 NOTE — Telephone Encounter (Signed)
Crystal Crabb called w/Dr Alcide Evener office stating he is requiring written documentation from Dr Dohmeier for clearance for patient's surgery. The fax # is [phone]. Please call and advise.

## 2014-12-04 ENCOUNTER — Telehealth: Payer: Self-pay | Admitting: Neurology

## 2014-12-04 NOTE — Telephone Encounter (Signed)
Faxed a letter from Dr. Brett Fairy giving neurological clearance to crystal at the fax # listed below.

## 2014-12-04 NOTE — Telephone Encounter (Signed)
Patient is calling to get clarity about information on [employer] in regard to his MRIs.  Please call.

## 2014-12-04 NOTE — Telephone Encounter (Signed)
Chase [name] with Dr. Ruffin Frederick office states she has not received the written document from Dr. Brett Fairy for clearance of surgery.  She requested that you re-fax to [phone].  Thanks!

## 2014-12-04 NOTE — Telephone Encounter (Signed)
Returned pt's call. He was wondering who was going to call him regarding scheduling an appt with Dr. Erlinda Hong or Dr. Leonie Man because Dr. Brett Fairy put in the referral for a stroke specialist at the beginning of June to go over his MRIs. I informed him that I would forward this on and he would be receiving a phone call from [place].

## 2014-12-04 NOTE — Telephone Encounter (Signed)
Spoke to [employer] at Dr. Ruffin Frederick office. She did indeed receive the written document, and nothing further is needed.

## 2014-12-04 NOTE — Telephone Encounter (Signed)
Spoke with patient and informed him of limited availability of Dr Leonie Man, Dr Erlinda Hong until August. He stated he specifically wants to see stroke dr. He was scheduled to see Daun Peacock, NP. Cancelled appt with Daun Peacock; scheduled him to see Dr Erlinda Hong 01/26/15, per patient's request. Patient verbalized understanding of new appt date/time and appreciation.

## 2014-12-05 ENCOUNTER — Other Ambulatory Visit: Payer: Self-pay | Admitting: Neurology

## 2014-12-11 ENCOUNTER — Other Ambulatory Visit: Payer: Self-pay

## 2014-12-29 ENCOUNTER — Other Ambulatory Visit: Payer: Self-pay | Admitting: Family Medicine

## 2015-01-02 ENCOUNTER — Ambulatory Visit: Payer: BLUE CROSS/BLUE SHIELD | Admitting: Family Medicine

## 2015-01-17 ENCOUNTER — Ambulatory Visit: Payer: BLUE CROSS/BLUE SHIELD | Admitting: Nurse Practitioner

## 2015-01-19 ENCOUNTER — Encounter: Payer: Self-pay | Admitting: Family Medicine

## 2015-01-19 MED ORDER — HYDROCODONE-ACETAMINOPHEN 7.5-325 MG/15ML PO SOLN: ORAL | Status: DC

## 2015-01-19 NOTE — Addendum Note (Signed)
Addended by: Ewing Schlein on: 01/19/2015 10:56 AM   Modules accepted: Orders

## 2015-01-19 NOTE — Telephone Encounter (Signed)
Refill x1 

## 2015-01-19 NOTE — Telephone Encounter (Signed)
Last seen 10/03/14 and filled 09/25/14 #120 ml UDS 09/02/12 low risk   Please advise     KP

## 2015-01-23 ENCOUNTER — Other Ambulatory Visit: Payer: Self-pay | Admitting: Family Medicine

## 2015-01-23 NOTE — Telephone Encounter (Signed)
Rx sent to pharmacy   

## 2015-01-26 ENCOUNTER — Encounter: Payer: Self-pay | Admitting: Neurology

## 2015-01-26 ENCOUNTER — Ambulatory Visit (INDEPENDENT_AMBULATORY_CARE_PROVIDER_SITE_OTHER): Payer: BLUE CROSS/BLUE SHIELD | Admitting: Neurology

## 2015-01-26 VITALS — BP 152/96 | HR 85 | Ht 70.0 in | Wt 200.6 lb

## 2015-01-26 DIAGNOSIS — E1159 Type 2 diabetes mellitus with other circulatory complications: Secondary | ICD-10-CM

## 2015-01-26 DIAGNOSIS — I639 Cerebral infarction, unspecified: Secondary | ICD-10-CM

## 2015-01-26 DIAGNOSIS — G4733 Obstructive sleep apnea (adult) (pediatric): Secondary | ICD-10-CM

## 2015-01-26 DIAGNOSIS — E348 Other specified endocrine disorders: Secondary | ICD-10-CM

## 2015-01-26 DIAGNOSIS — E785 Hyperlipidemia, unspecified: Secondary | ICD-10-CM | POA: Insufficient documentation

## 2015-01-26 DIAGNOSIS — R002 Palpitations: Secondary | ICD-10-CM

## 2015-01-26 DIAGNOSIS — Z9989 Dependence on other enabling machines and devices: Secondary | ICD-10-CM

## 2015-01-26 DIAGNOSIS — I10 Essential (primary) hypertension: Secondary | ICD-10-CM | POA: Insufficient documentation

## 2015-01-26 HISTORY — DX: Palpitations: R00.2

## 2015-01-26 HISTORY — DX: Hyperlipidemia, unspecified: E78.5

## 2015-01-26 HISTORY — DX: Essential (primary) hypertension: I10

## 2015-01-26 MED ORDER — ASPIRIN 81 MG PO CHEW: 75.0000 mg | CHEWABLE_TABLET | Freq: Every day | ORAL | Status: DC

## 2015-01-26 MED ORDER — PRAVASTATIN SODIUM 20 MG PO TABS: 20.0000 mg | ORAL_TABLET | Freq: Every day | ORAL | Status: DC

## 2015-01-26 NOTE — Patient Instructions (Signed)
-   will prescribe pravastatin for high cholesterol and for stroke prevention - will prescribe ASA EC for stroke prevention - will check ultrasound of neck, head and heart for stroke work up - will request 30 day cardiac monitoring to rule out afib - Follow up with your primary care physician for stroke risk factor modification. Recommend maintain blood pressure goal <130/80, diabetes with hemoglobin A1c goal below 6.5% and lipids with LDL cholesterol goal below 70 mg/dL.  - check BP and glucose at home - continue follow up with Dr. Brett Fairy for sleep apnea - follow up in 2 months.

## 2015-01-26 NOTE — Progress Notes (Signed)
STROKE NEUROLOGY FOLLOW UP NOTE  NAME: Chase [name] DOB: 03-12-1961  REASON FOR VISIT: stroke follow up HISTORY FROM: chart and pt  Today we had the pleasure of seeing Chase [name] in follow-up at our Neurology Clinic. Pt was accompanied by no one.   History Summary Chase [name] is a 54 y.o. 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   Pt stated that he never had clear episode of stroke like symptoms. He has chronic symptoms of headache, forgetfulness, difficulty with concentration, sometimes difficulty getting words out, slow reaction and answering questions. He has been following with Dr. Erling Cruz about these issues. Had series MRI showed stable pineal cyst and also progression of ischemic white matter changes. MRI brain in 10/2008 compared to have an acute or subacute infarct at right frontal deep white matter. Patient again had a recent MRI in 10/2014 showed scattered T2 hyperintense signal in the left caudate head, right putamen and corona radiata, right pounds as well as a right middle cerebral peduncle, seems progressed from before and likely representing asymptomatic lacunar infarcts versus chronic microvascular ischemia, although chronic headache may have similar appearance. A1c was 6.8 in April and LDL 122, normal TSH. He was referred to stroke clinic for further evaluation. He has not been on any antiplatelet, as he heard that aspirin could hurt his stomach. He was on Lipitor in the past but was taken off due to muscle pain.  He has been also following up with Dr. Brett Fairy for sleep apnea on CPAP. He has long-standing history of hypertension, diabetes, hyperlipidemia, following up with PCP, and was told in good control. He also admitted that he sometimes feels heart palpitation, skipped beat, he happened 1-3 times per week, has not been evaluated for this.  REVIEW OF SYSTEMS: Full 14 system  review of systems performed and notable only for those listed below and in HPI above, all others are negative:  Constitutional:  Fever, chills, weight gain, fatigue Cardiovascular: Chest pain, palpitation, swelling in legs Ear/Nose/Throat:  Hearing loss, ringing years, trouble swallowing Skin: Itching, moles Eyes:  Blurry vision, eye pain Respiratory:  SOB, cough, snoring Gastroitestinal:  Blood in stool, constipation Genitourinary: Urination problems Hematology/Lymphatic:   Endocrine: Feeling hot, increased thirst Musculoskeletal:  Joint pain, joint swelling, cramps, aching muscles Allergy/Immunology:  Allergies, runny nose, skin sensitivity Neurological:  Memory loss, confusion, headache, numbness, weakness, slurry space, difficulty swallowing, dizziness Psychiatric: Depression, anxiety, too much sleep, not enough sleep, decreased energy, change in appetite, disinterest in activities, racing thoughts Sleep: Insomnia, sleepiness, snoring  The following represents the patient's updated allergies and side effects list: Allergies  Allergen Reactions  . Amoxicillin Hives, Itching and Other (See Comments)    White tongue; ? hives  . Flonase [Fluticasone Propionate]     Nasal Sores  . Terfenadine     ? reaction    The neurologically relevant items on the patient's problem list were reviewed on today's visit.  Neurologic Examination  A problem focused neurological exam (12 or more points of the single system neurologic examination, vital signs counts as 1 point, cranial nerves count for 8 points) was performed.  Blood pressure 152/96, pulse 85, height 5\' 10"  (1.778 m), weight 200 lb 9.6 oz (90.992 kg).  General - Well nourished, well developed, in no apparent distress.  Ophthalmologic - Fundi not visualized due to eye movement.  Cardiovascular - Regular rate and  rhythm.  Mental Status - significant psychomotor slowing mini-mental status exam Orientation to time -  5/5 Orientation to place - 4/5 Registration - 3/3 Attention - 3/5 Delayed recall - 3/3 Naming - 2/2 Repetition - 1/1 Comprehension - 3/3 Reading - 1/1 Writing - 1/1 Visuospatial - 1/1  Total 27/30  Cranial Nerves II - XII - II - Visual field intact OU. III, IV, VI - Extraocular movements intact. V - Facial sensation intact bilaterally. VII - Facial movement intact bilaterally. VIII - Hard of hearing & vestibular intact bilaterally. X - Palate elevates symmetrically. XI - Chin turning & shoulder shrug intact bilaterally. XII - Tongue protrusion intact.  Motor Strength - The patient's strength was normal in all extremities and pronator drift was absent.  Bulk was normal and fasciculations were absent.   Motor Tone - Muscle tone was assessed at the neck and appendages and was normal.  Reflexes - The patient's reflexes were 1+ in all extremities and he had no pathological reflexes.  Sensory - Light touch, temperature/pinprick, vibration and proprioception, and Romberg testing were assessed and were normal.    Coordination - The patient had normal movements in the hands and feet with no ataxia or dysmetria.  Tremor was absent.  Gait and Station - The patient's transfers, posture, gait, station, and turns were observed as normal.  Data reviewed: I personally reviewed the images and agree with the radiology interpretations.  MRI 10/28/14 - Abnormal MRI scan of the brain showing scattered lacunar infarcts in the left caudate head, right putamen and corona radiata, right pons as well as right middle cerebellar peduncle with mild changes of chronic microvascular ischemia. These appear slightly advanced compared with previous MRI scan from 10/06/2012. Stable 15x13x8 mm complex pineal region cyst compared with MRI scan 10/06/2012.  Component     Latest Ref Rng 10/03/2014  Total Bilirubin     0.2 - 1.2 mg/dL 0.6  Bilirubin, Direct     0.0 - 0.3 mg/dL 0.1  Alkaline Phosphatase     39 -  117 U/L 79  AST     0 - 37 U/L 14  ALT     0 - 53 U/L 17  Total Protein     6.0 - 8.3 g/dL 7.1  Albumin     3.5 - 5.2 g/dL 4.5  Cholesterol     0 - 200 mg/dL 180  Triglycerides     0.0 - 149.0 mg/dL 128.0  HDL Cholesterol     >39.00 mg/dL 32.90 (L)  VLDL     0.0 - 40.0 mg/dL 25.6  LDL (calc)     0 - 99 mg/dL 122 (H)  Total CHOL/HDL Ratio      5  NonHDL      147.10  Hemoglobin A1C     4.6 - 6.5 % 6.8 (H)  TSH     0.35 - 4.50 uIU/mL 2.43    Assessment: As you may recall, he is a 54 y.o. 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 He has never had clear episode of stroke symptoms but all nonspecific such as headache, forgetfulness, difficulty with concentration, sometimes difficulty getting words out, slow reaction and answering questions. Recent MRI did show multiple hyper T2 signals in the area common of small vessel infarcts although those could also be seen in pt with long standing headache. However, he does have multiple risk  factors for stroke, including HTN, DM, HLD, OSA, will need stroke risk factor modification and stroke prevention.  Plan:  - will prescribe pravastatin for high cholesterol and for stroke prevention - will prescribe ASA EC 81mg  for stroke prevention - will order TCD, carotid Doppler, 2-D echo - will request 30 day cardiac monitoring to rule out afib - Follow up with your primary care physician for stroke risk factor modification. Recommend maintain blood pressure goal <130/80, diabetes with hemoglobin A1c goal below 6.5% and lipids with LDL cholesterol goal below 70 mg/dL.  - check BP and glucose at home - continue follow up with Dr. Brett Fairy for OSA - RTC in 2 months.  Orders Placed This Encounter  Procedures  . US Carotid Bilateral    Standing Status: Future     Number of Occurrences:      Standing Expiration Date: 03/29/2016    Order  Specific Question:  Reason for Exam (SYMPTOM  OR DIAGNOSIS REQUIRED)    Answer:  Stroke    Order Specific Question:  Preferred imaging location?    Answer:  Internal  . [place] TCD COMPLETE    Standing Status: Future     Number of Occurrences:      Standing Expiration Date: 03/29/2016    Order Specific Question:  Reason for Exam (SYMPTOM  OR DIAGNOSIS REQUIRED)    Answer:  Stroke    Order Specific Question:  Preferred imaging location?    Answer:  Internal  . Cardiac event monitor    Standing Status: Future     Number of Occurrences:      Standing Expiration Date: 01/27/2016    Scheduling Instructions:     Request cardionet setup. Thank you.    Order Specific Question:  Where should this test be performed?    Answer:  [facility]  . ECHOCARDIOGRAM COMPLETE    Standing Status: Future     Number of Occurrences:      Standing Expiration Date: 04/26/2016    Order Specific Question:  Where should this test be performed    Answer:  [facility])    Order Specific Question:  Complete or Limited study?    Answer:  Complete    Order Specific Question:  With Image Enhancing Agent or without Image Enhancing Agent?    Answer:  With Image Enhancing Agent    Order Specific Question:  Reason for exam-Echo    Answer:  Stroke  434.91 / I163.9    Meds ordered this encounter  Medications  . hydrocortisone-pramoxine (ANALPRAM-HC) 2.5-1 % rectal cream    Sig: PLACE 1 APPLICATION RECTALLY 3 (THREE) TIMES A DAY.  Marland Kitchen DISCONTD: allopurinol (ZYLOPRIM) 100 MG tablet    Sig: TAKE 1 TABLET BY MOUTH DAILY. OFFICE VISIT DUE NOW WITH PCP  . Azelastine HCl 0.15 % SOLN    Sig: PLACE 2 SPRAYS IN EACH NOSTRIL TWICE DAILY  . DISCONTD: cetirizine (ZYRTEC) 10 MG tablet    Sig: TAKE 1 TABLET (10 MG TOTAL) BY MOUTH DAILY.  Marland Kitchen DISCONTD: cyclobenzaprine (FLEXERIL) 10 MG tablet    Sig: TAKE 1 TABLET (10 MG TOTAL) BY MOUTH AT BEDTIME.  . diazepam (VALIUM) 5 MG tablet    Sig: TAKE 1 TABLET BY MOUTH  3 TIMES A DAY  . DISCONTD: HYDROcodone-acetaminophen (HYCET) 7.5-325 mg/15 ml solution    Sig: TAKE 15 ML (1 TABLESPOONFUL) BY MOUTH EVERY 6 HOURS AS NEEDED FOR PAIN  . DISCONTD: venlafaxine XR (EFFEXOR-XR) 37.5 MG 24 hr capsule  Sig: TAKE 1 CAPSULE (37.5 MG TOTAL) BY MOUTH DAILY.  Marland Kitchen DISCONTD: metoprolol tartrate (LOPRESSOR) 25 MG tablet    Sig: TAKE 1 TABLET TWICE A DAY, MAY CRUSH IN FOOD  . DISCONTD: triamcinolone (NASACORT) 55 MCG/ACT AERO nasal inhaler    Sig: PLACE 2 SPRAYS INTO THE NOSE DAILY.  . ranitidine (ZANTAC) 75 MG/5ML syrup    Sig: TAKE 10 MLS BY MOUTH 2 TIMES DAILY AS NEEDED. FOR INDIGESTION.  . DISCONTD: meclizine (ANTIVERT) 25 MG tablet    Sig: TAKE 1 TABLET (25 MG TOTAL) BY MOUTH 3 (THREE) TIMES DAILY AS NEEDED.  . hydrOXYzine (ATARAX/VISTARIL) 25 MG tablet    Sig: TAKE 1 TABLET (25 MG TOTAL) BY MOUTH EVERY 8 (EIGHT) HOURS AS NEEDED FOR ANXIETY OR ITCHING.  . DISCONTD: hydrocortisone (ANUSOL-HC) 25 MG suppository    Sig: PLACE 1 SUPPOSITORY INTO RECTUM TWICE A DAY  . DISCONTD: hydrOXYzine (ATARAX/VISTARIL) 10 MG tablet    Sig: TAKE 1 TABLET (10 MG TOTAL) BY MOUTH 3 (THREE) TIMES DAILY AS NEEDED.    Refill:  2  . aspirin 81 MG chewable tablet    Sig: Chew 1 tablet (81 mg total) by mouth daily.    Dispense:  90 tablet    Refill:  3  . pravastatin (PRAVACHOL) 20 MG tablet    Sig: Take 1 tablet (20 mg total) by mouth daily.    Dispense:  90 tablet    Refill:  3    Patient Instructions  - will prescribe pravastatin for high cholesterol and for stroke prevention - will prescribe ASA EC for stroke prevention - will check ultrasound of neck, head and heart for stroke work up - will request 30 day cardiac monitoring to rule out afib - Follow up with your primary care physician for stroke risk factor modification. Recommend maintain blood pressure goal <130/80, diabetes with hemoglobin A1c goal below 6.5% and lipids with LDL cholesterol goal below 70 mg/dL.  - check BP  and glucose at home - continue follow up with Dr. Brett Fairy for sleep apnea - follow up in 2 months.    Rosalin Hawking, MD PhD [facility] [address] [place], [place] [address] [phone]

## 2015-02-01 ENCOUNTER — Other Ambulatory Visit: Payer: Medicaid Other

## 2015-02-05 ENCOUNTER — Other Ambulatory Visit: Payer: Self-pay | Admitting: Neurology

## 2015-02-05 DIAGNOSIS — I639 Cerebral infarction, unspecified: Secondary | ICD-10-CM

## 2015-02-05 DIAGNOSIS — R002 Palpitations: Secondary | ICD-10-CM

## 2015-02-05 DIAGNOSIS — I4891 Unspecified atrial fibrillation: Secondary | ICD-10-CM

## 2015-02-06 ENCOUNTER — Ambulatory Visit (HOSPITAL_COMMUNITY): Payer: BLUE CROSS/BLUE SHIELD | Attending: Cardiovascular Disease

## 2015-02-06 ENCOUNTER — Other Ambulatory Visit: Payer: Self-pay

## 2015-02-06 ENCOUNTER — Ambulatory Visit (INDEPENDENT_AMBULATORY_CARE_PROVIDER_SITE_OTHER): Payer: BLUE CROSS/BLUE SHIELD

## 2015-02-06 DIAGNOSIS — E119 Type 2 diabetes mellitus without complications: Secondary | ICD-10-CM | POA: Insufficient documentation

## 2015-02-06 DIAGNOSIS — G4733 Obstructive sleep apnea (adult) (pediatric): Secondary | ICD-10-CM | POA: Insufficient documentation

## 2015-02-06 DIAGNOSIS — I371 Nonrheumatic pulmonary valve insufficiency: Secondary | ICD-10-CM | POA: Diagnosis not present

## 2015-02-06 DIAGNOSIS — E785 Hyperlipidemia, unspecified: Secondary | ICD-10-CM | POA: Insufficient documentation

## 2015-02-06 DIAGNOSIS — I639 Cerebral infarction, unspecified: Secondary | ICD-10-CM | POA: Diagnosis not present

## 2015-02-06 DIAGNOSIS — I517 Cardiomegaly: Secondary | ICD-10-CM | POA: Insufficient documentation

## 2015-02-06 DIAGNOSIS — R002 Palpitations: Secondary | ICD-10-CM | POA: Diagnosis not present

## 2015-02-06 DIAGNOSIS — I10 Essential (primary) hypertension: Secondary | ICD-10-CM | POA: Insufficient documentation

## 2015-02-06 DIAGNOSIS — I4891 Unspecified atrial fibrillation: Secondary | ICD-10-CM | POA: Diagnosis not present

## 2015-02-07 ENCOUNTER — Telehealth: Payer: Self-pay

## 2015-02-07 NOTE — Telephone Encounter (Signed)
Spoke to patient and informed him that his echocardiogram was unremarkable and to continue his current treatment plan. Patient verbalized understanding

## 2015-02-08 ENCOUNTER — Other Ambulatory Visit: Payer: Self-pay | Admitting: Neurology

## 2015-02-09 ENCOUNTER — Other Ambulatory Visit: Payer: Self-pay | Admitting: Family Medicine

## 2015-02-09 NOTE — Telephone Encounter (Signed)
Last seen 10/03/14 and filled 10/26/14 #90 UDS 09/02/12 low risk   Please advise      KP

## 2015-02-10 ENCOUNTER — Observation Stay (HOSPITAL_COMMUNITY)
Admission: EM | Admit: 2015-02-10 | Discharge: 2015-02-12 | Disposition: A | Discharge status: Home / Self Care | Payer: BLUE CROSS/BLUE SHIELD | Attending: Internal Medicine | Admitting: Internal Medicine

## 2015-02-10 ENCOUNTER — Encounter (HOSPITAL_COMMUNITY): Payer: Self-pay

## 2015-02-10 DIAGNOSIS — K921 Melena: Secondary | ICD-10-CM

## 2015-02-10 DIAGNOSIS — R109 Unspecified abdominal pain: Secondary | ICD-10-CM | POA: Diagnosis present

## 2015-02-10 DIAGNOSIS — E876 Hypokalemia: Secondary | ICD-10-CM | POA: Diagnosis not present

## 2015-02-10 DIAGNOSIS — K626 Ulcer of anus and rectum: Secondary | ICD-10-CM | POA: Diagnosis not present

## 2015-02-10 DIAGNOSIS — D62 Acute posthemorrhagic anemia: Secondary | ICD-10-CM | POA: Diagnosis not present

## 2015-02-10 DIAGNOSIS — K922 Gastrointestinal hemorrhage, unspecified: Secondary | ICD-10-CM

## 2015-02-10 DIAGNOSIS — R300 Dysuria: Secondary | ICD-10-CM | POA: Insufficient documentation

## 2015-02-10 DIAGNOSIS — K219 Gastro-esophageal reflux disease without esophagitis: Secondary | ICD-10-CM | POA: Diagnosis not present

## 2015-02-10 DIAGNOSIS — G4733 Obstructive sleep apnea (adult) (pediatric): Secondary | ICD-10-CM | POA: Insufficient documentation

## 2015-02-10 DIAGNOSIS — D649 Anemia, unspecified: Secondary | ICD-10-CM

## 2015-02-10 DIAGNOSIS — K644 Residual hemorrhoidal skin tags: Secondary | ICD-10-CM | POA: Insufficient documentation

## 2015-02-10 DIAGNOSIS — I10 Essential (primary) hypertension: Secondary | ICD-10-CM | POA: Diagnosis not present

## 2015-02-10 DIAGNOSIS — R0789 Other chest pain: Secondary | ICD-10-CM | POA: Diagnosis not present

## 2015-02-10 DIAGNOSIS — F419 Anxiety disorder, unspecified: Secondary | ICD-10-CM | POA: Insufficient documentation

## 2015-02-10 DIAGNOSIS — H9190 Unspecified hearing loss, unspecified ear: Secondary | ICD-10-CM | POA: Diagnosis not present

## 2015-02-10 DIAGNOSIS — I481 Persistent atrial fibrillation: Secondary | ICD-10-CM | POA: Insufficient documentation

## 2015-02-10 DIAGNOSIS — N289 Disorder of kidney and ureter, unspecified: Secondary | ICD-10-CM

## 2015-02-10 DIAGNOSIS — N179 Acute kidney failure, unspecified: Secondary | ICD-10-CM | POA: Insufficient documentation

## 2015-02-10 DIAGNOSIS — E785 Hyperlipidemia, unspecified: Secondary | ICD-10-CM | POA: Diagnosis not present

## 2015-02-10 DIAGNOSIS — I4819 Other persistent atrial fibrillation: Secondary | ICD-10-CM | POA: Diagnosis present

## 2015-02-10 DIAGNOSIS — I639 Cerebral infarction, unspecified: Secondary | ICD-10-CM

## 2015-02-10 DIAGNOSIS — L259 Unspecified contact dermatitis, unspecified cause: Secondary | ICD-10-CM

## 2015-02-10 DIAGNOSIS — Z79899 Other long term (current) drug therapy: Secondary | ICD-10-CM | POA: Insufficient documentation

## 2015-02-10 DIAGNOSIS — M1990 Unspecified osteoarthritis, unspecified site: Secondary | ICD-10-CM | POA: Diagnosis not present

## 2015-02-10 DIAGNOSIS — I69820 Aphasia following other cerebrovascular disease: Secondary | ICD-10-CM | POA: Insufficient documentation

## 2015-02-10 DIAGNOSIS — Z794 Long term (current) use of insulin: Secondary | ICD-10-CM | POA: Insufficient documentation

## 2015-02-10 DIAGNOSIS — K625 Hemorrhage of anus and rectum: Secondary | ICD-10-CM | POA: Diagnosis present

## 2015-02-10 DIAGNOSIS — R339 Retention of urine, unspecified: Secondary | ICD-10-CM | POA: Insufficient documentation

## 2015-02-10 DIAGNOSIS — Z7952 Long term (current) use of systemic steroids: Secondary | ICD-10-CM | POA: Diagnosis not present

## 2015-02-10 DIAGNOSIS — Z791 Long term (current) use of non-steroidal anti-inflammatories (NSAID): Secondary | ICD-10-CM | POA: Diagnosis not present

## 2015-02-10 DIAGNOSIS — Z7982 Long term (current) use of aspirin: Secondary | ICD-10-CM | POA: Insufficient documentation

## 2015-02-10 DIAGNOSIS — F329 Major depressive disorder, single episode, unspecified: Secondary | ICD-10-CM | POA: Insufficient documentation

## 2015-02-10 DIAGNOSIS — Z9889 Other specified postprocedural states: Secondary | ICD-10-CM | POA: Diagnosis not present

## 2015-02-10 DIAGNOSIS — K649 Unspecified hemorrhoids: Secondary | ICD-10-CM | POA: Diagnosis present

## 2015-02-10 DIAGNOSIS — E1159 Type 2 diabetes mellitus with other circulatory complications: Secondary | ICD-10-CM | POA: Diagnosis not present

## 2015-02-10 DIAGNOSIS — Z7951 Long term (current) use of inhaled steroids: Secondary | ICD-10-CM | POA: Insufficient documentation

## 2015-02-10 DIAGNOSIS — R739 Hyperglycemia, unspecified: Secondary | ICD-10-CM

## 2015-02-10 DIAGNOSIS — E860 Dehydration: Secondary | ICD-10-CM | POA: Insufficient documentation

## 2015-02-10 DIAGNOSIS — Z9989 Dependence on other enabling machines and devices: Secondary | ICD-10-CM

## 2015-02-10 HISTORY — DX: Disorder of kidney and ureter, unspecified: N28.9

## 2015-02-10 HISTORY — DX: Melena: K92.1

## 2015-02-10 HISTORY — DX: Unspecified hemorrhoids: K64.9

## 2015-02-10 HISTORY — DX: Anemia, unspecified: D64.9

## 2015-02-10 HISTORY — DX: Gastrointestinal hemorrhage, unspecified: K92.2

## 2015-02-10 HISTORY — DX: Ulcer of anus and rectum: K62.6

## 2015-02-10 LAB — URINALYSIS, ROUTINE W REFLEX MICROSCOPIC
Bilirubin Urine: NEGATIVE
Glucose, UA: NEGATIVE mg/dL
Hgb urine dipstick: NEGATIVE
Ketones, ur: NEGATIVE mg/dL
Leukocytes, UA: NEGATIVE
Nitrite: NEGATIVE
Protein, ur: NEGATIVE mg/dL
Specific Gravity, Urine: 1.01 (ref 1.005–1.030)
Urobilinogen, UA: 0.2 mg/dL (ref 0.0–1.0)
pH: 7 (ref 5.0–8.0)

## 2015-02-10 LAB — RETICULOCYTES
RBC.: 3.99 MIL/uL — ABNORMAL LOW (ref 4.22–5.81)
Retic Count, Absolute: 79.8 10*3/uL (ref 19.0–186.0)
Retic Ct Pct: 2 % (ref 0.4–3.1)

## 2015-02-10 LAB — CBC
HCT: 35.6 % — ABNORMAL LOW (ref 39.0–52.0)
HCT: 31.5 % — ABNORMAL LOW (ref 39.0–52.0)
Hemoglobin: 12 g/dL — ABNORMAL LOW (ref 13.0–17.0)
Hemoglobin: 10.6 g/dL — ABNORMAL LOW (ref 13.0–17.0)
MCH: 29.3 pg (ref 26.0–34.0)
MCH: 29.5 pg (ref 26.0–34.0)
MCHC: 33.7 g/dL (ref 30.0–36.0)
MCHC: 33.7 g/dL (ref 30.0–36.0)
MCV: 87 fL (ref 78.0–100.0)
MCV: 87.7 fL (ref 78.0–100.0)
Platelets: 353 10*3/uL (ref 150–400)
Platelets: 320 10*3/uL (ref 150–400)
RBC: 4.09 MIL/uL — ABNORMAL LOW (ref 4.22–5.81)
RBC: 3.59 MIL/uL — ABNORMAL LOW (ref 4.22–5.81)
RDW: 12.9 % (ref 11.5–15.5)
RDW: 13 % (ref 11.5–15.5)
WBC: 11.8 10*3/uL — ABNORMAL HIGH (ref 4.0–10.5)
WBC: 10 10*3/uL (ref 4.0–10.5)

## 2015-02-10 LAB — COMPREHENSIVE METABOLIC PANEL
ALT: 20 U/L (ref 17–63)
AST: 26 U/L (ref 15–41)
Albumin: 4 g/dL (ref 3.5–5.0)
Alkaline Phosphatase: 57 U/L (ref 38–126)
Anion gap: 10 (ref 5–15)
BUN: 19 mg/dL (ref 6–20)
CO2: 25 mmol/L (ref 22–32)
Calcium: 8.8 mg/dL — ABNORMAL LOW (ref 8.9–10.3)
Chloride: 104 mmol/L (ref 101–111)
Creatinine, Ser: 1.37 mg/dL — ABNORMAL HIGH (ref 0.61–1.24)
GFR calc Af Amer: >60 mL/min (ref >60)
GFR calc non Af Amer: 57 mL/min — ABNORMAL LOW (ref >60)
Glucose, Bld: 130 mg/dL — ABNORMAL HIGH (ref 65–99)
Potassium: 3.5 mmol/L (ref 3.5–5.1)
Sodium: 139 mmol/L (ref 135–145)
Total Bilirubin: 0.8 mg/dL (ref 0.3–1.2)
Total Protein: 6.7 g/dL (ref 6.5–8.1)

## 2015-02-10 LAB — PROTIME-INR
INR: 1.11 (ref 0.00–1.49)
Prothrombin Time: 14.5 seconds (ref 11.6–15.2)

## 2015-02-10 LAB — TROPONIN I: Troponin I: <0.03 ng/mL (ref <0.031)

## 2015-02-10 LAB — TYPE AND SCREEN
ABO/RH(D): O NEG
Antibody Screen: NEGATIVE

## 2015-02-10 LAB — MRSA PCR SCREENING: MRSA by PCR: NEGATIVE

## 2015-02-10 LAB — LACTIC ACID, PLASMA: Lactic Acid, Venous: 1.3 mmol/L (ref 0.5–2.0)

## 2015-02-10 LAB — GLUCOSE, CAPILLARY: Glucose-Capillary: 61 mg/dL — ABNORMAL LOW (ref 65–99)

## 2015-02-10 LAB — APTT: aPTT: 29 seconds (ref 24–37)

## 2015-02-10 LAB — ABO/RH: ABO/RH(D): O NEG

## 2015-02-10 MED ORDER — DEXTROSE-NACL 5-0.9 % IV SOLN
INTRAVENOUS | Status: DC
  Administered 2015-02-10: 22:00:00 via INTRAVENOUS

## 2015-02-10 MED ORDER — HYDROCODONE-ACETAMINOPHEN 5-325 MG PO TABS: 1.0000 | ORAL_TABLET | ORAL | Status: DC | PRN

## 2015-02-10 MED ORDER — CYCLOBENZAPRINE HCL 10 MG PO TABS
10.0000 mg | ORAL_TABLET | Freq: Every day | ORAL | Status: DC
  Administered 2015-02-10 – 2015-02-11 (×2): 10 mg via ORAL
  Filled 2015-02-10 (×2): qty 1

## 2015-02-10 MED ORDER — METOPROLOL TARTRATE 12.5 MG HALF TABLET
12.5000 mg | ORAL_TABLET | Freq: Two times a day (BID) | ORAL | Status: DC
  Administered 2015-02-11: 12.5 mg via ORAL
  Filled 2015-02-10: qty 1

## 2015-02-10 MED ORDER — DIAZEPAM 5 MG PO TABS
5.0000 mg | ORAL_TABLET | Freq: Three times a day (TID) | ORAL | Status: DC
  Administered 2015-02-10 – 2015-02-12 (×4): 5 mg via ORAL
  Filled 2015-02-10 (×4): qty 1

## 2015-02-10 MED ORDER — MORPHINE SULFATE (PF) 2 MG/ML IV SOLN: 2.0000 mg | INTRAVENOUS | Status: DC | PRN

## 2015-02-10 MED ORDER — ACETAMINOPHEN 650 MG RE SUPP: 650.0000 mg | Freq: Four times a day (QID) | RECTAL | Status: DC | PRN

## 2015-02-10 MED ORDER — ONDANSETRON HCL 4 MG PO TABS: 4.0000 mg | ORAL_TABLET | Freq: Four times a day (QID) | ORAL | Status: DC | PRN

## 2015-02-10 MED ORDER — ACETAMINOPHEN 325 MG PO TABS
650.0000 mg | ORAL_TABLET | Freq: Four times a day (QID) | ORAL | Status: DC | PRN
  Administered 2015-02-11: 650 mg via ORAL
  Filled 2015-02-10: qty 2

## 2015-02-10 MED ORDER — INSULIN ASPART 100 UNIT/ML ~~LOC~~ SOLN
0.0000 [IU] | SUBCUTANEOUS | Status: DC
  Administered 2015-02-11 (×5): 1 [IU] via SUBCUTANEOUS

## 2015-02-10 MED ORDER — VENLAFAXINE HCL ER 37.5 MG PO CP24
37.5000 mg | ORAL_CAPSULE | Freq: Every day | ORAL | Status: DC
  Administered 2015-02-10 – 2015-02-12 (×3): 37.5 mg via ORAL
  Filled 2015-02-10 (×3): qty 1

## 2015-02-10 MED ORDER — PANTOPRAZOLE SODIUM 40 MG IV SOLR
40.0000 mg | INTRAVENOUS | Status: AC
  Administered 2015-02-10: 40 mg via INTRAVENOUS
  Filled 2015-02-10: qty 40

## 2015-02-10 MED ORDER — ALLOPURINOL 100 MG PO TABS
100.0000 mg | ORAL_TABLET | Freq: Every day | ORAL | Status: DC
  Administered 2015-02-10 – 2015-02-12 (×3): 100 mg via ORAL
  Filled 2015-02-10 (×3): qty 1

## 2015-02-10 MED ORDER — PANTOPRAZOLE SODIUM 40 MG IV SOLR
40.0000 mg | Freq: Two times a day (BID) | INTRAVENOUS | Status: DC
  Administered 2015-02-10 – 2015-02-11 (×2): 40 mg via INTRAVENOUS
  Filled 2015-02-10 (×2): qty 40

## 2015-02-10 MED ORDER — SODIUM CHLORIDE 0.9 % IJ SOLN
3.0000 mL | Freq: Two times a day (BID) | INTRAMUSCULAR | Status: DC
  Administered 2015-02-10 – 2015-02-12 (×4): 3 mL via INTRAVENOUS

## 2015-02-10 MED ORDER — LORATADINE 10 MG PO TABS
10.0000 mg | ORAL_TABLET | Freq: Every day | ORAL | Status: DC
  Administered 2015-02-10 – 2015-02-12 (×3): 10 mg via ORAL
  Filled 2015-02-10 (×3): qty 1

## 2015-02-10 MED ORDER — SODIUM CHLORIDE 0.9 % IV SOLN
INTRAVENOUS | Status: DC
  Administered 2015-02-10: 21:00:00 via INTRAVENOUS

## 2015-02-10 MED ORDER — FAMOTIDINE IN NACL 20-0.9 MG/50ML-% IV SOLN
20.0000 mg | INTRAVENOUS | Status: AC
Start: 2015-02-10 — End: 2015-02-10
  Administered 2015-02-10: 20 mg via INTRAVENOUS
  Filled 2015-02-10: qty 50

## 2015-02-10 MED ORDER — SODIUM CHLORIDE 0.9 % IV BOLUS (SEPSIS)
1000.0000 mL | Freq: Once | INTRAVENOUS | Status: AC
  Administered 2015-02-10: 1000 mL via INTRAVENOUS

## 2015-02-10 MED ORDER — ONDANSETRON HCL 4 MG/2ML IJ SOLN: 4.0000 mg | Freq: Three times a day (TID) | INTRAMUSCULAR | Status: DC | PRN

## 2015-02-10 MED ORDER — SODIUM CHLORIDE 0.9 % IV BOLUS (SEPSIS)
500.0000 mL | Freq: Once | INTRAVENOUS | Status: AC
  Administered 2015-02-10: 500 mL via INTRAVENOUS

## 2015-02-10 MED ORDER — ONDANSETRON HCL 4 MG/2ML IJ SOLN: 4.0000 mg | Freq: Four times a day (QID) | INTRAMUSCULAR | Status: DC | PRN

## 2015-02-10 NOTE — ED Notes (Signed)
Pt c/o abdominal pain, rectal bleeding and diarrhea starting this morning and R arm pain x "several days."  Pain score 9/10.  Pt reports Hx of internal and external hemorrhoids.  Pt reports constant bleeding.  Bleeding has been both dark and bright red.     Marland Kitchen

## 2015-02-10 NOTE — H&P (Signed)
PCP:  [name], DO  [facility]  Referring provider Dr. Hazle Nordmann   Chief Complaint:  Bright red blood per rectum  HPI: Chase [name] is a 54 y.o. male   has a past medical history of Allergy; Stroke; Sleep apnea; Headache(784.0); Arthritis; GERD (gastroesophageal reflux disease); Hypertension; Hyperlipidemia; Hearing loss; Nasal congestion; Trouble swallowing; Blood in stool; Chest pain; Leg swelling; Constipation; Rectal pain; Difficulty urinating; Thrombosed hemorrhoids; Brain cyst; PONV (postoperative nausea and vomiting); Diabetes mellitus without complication; and Stroke.   Presented with rectal bleeding today since AM. Patient denies epigastric pain, no melena. Patient woke up in AM and noted blood and stool in his clothes. He reports some abdominal cramping. He reports severe dysuria for the past few days.    He proceeded to have bloody bowel movements. 10-12 BM's. On presentation to emergency department was found to have bright red blood in her rectum. Last BM was prior to arrival to ER around 2 :30 PM. Patient felt light headed when he tried to stand up.  In ER MD was able to visualized external hemorrhoids which were nonbleeding. In emergency, hemoglobin down to 12 from baseline of 14. Creatinine up from baseline 1.1 to 1.37. Patient with history of atrial fibrillation. Initially mildly tachycardic in 100s. BUN 19 Patient endorses having some chest pain that has been coming and going today lasting a few minutes at a time.  In the past patient has history of external and internal hemorrhoids with hemorrhoidal bleeding 2 years ago. Apparently he have had a normal colonoscopy by digestive health in 2010. In the past he was told not to take N save secondary to problem of bleeding. In May this year patient had a abnormal MRI showing scattered lacunar infarcts he was started on aspirin 81 mg a day for stroke prevention.  Hospitalist was called for  admission for GI bleeding most likely lower GI bleed  Review of Systems:    Pertinent positives include: abdominal pain, blood in stool , chest pain,  Constitutional:  No weight loss, night sweats, Fevers, chills, fatigue, weight loss  HEENT:  No headaches, Difficulty swallowing,Tooth/dental problems,Sore throat,  No sneezing, itching, ear ache, nasal congestion, post nasal drip,  Cardio-vascular:  No  Orthopnea, PND, anasarca, dizziness, palpitations.no Bilateral lower extremity swelling  GI:  No heartburn, indigestion,  nausea, vomiting, diarrhea, change in bowel habits, loss of appetite, melena, blood in stool, hematemesis Resp:  no shortness of breath at rest. No dyspnea on exertion, No excess mucus, no productive cough, No non-productive cough, No coughing up of blood.No change in color of mucus.No wheezing. Skin:  no rash or lesions. No jaundice GU:  no dysuria, change in color of urine, no urgency or frequency. No straining to urinate.  No flank pain.  Musculoskeletal:  No joint pain or no joint swelling. No decreased range of motion. No back pain.  Psych:  No change in mood or affect. No depression or anxiety. No memory loss.  Neuro: no localizing neurological complaints, no tingling, no weakness, no double vision, no gait abnormality, no slurred speech, no confusion  Otherwise ROS are negative except for above, 10 systems were reviewed  Past Medical History: Past Medical History  Diagnosis Date  . Allergy   . Stroke   . Sleep apnea   . Headache(784.0)   . Arthritis   . GERD (gastroesophageal reflux disease)   . Hypertension   . Hyperlipidemia   . Hearing loss   .  Nasal congestion   . Trouble swallowing   . Blood in stool   . Chest pain   . Leg swelling   . Constipation   . Rectal pain   . Difficulty urinating   . Thrombosed hemorrhoids   . Brain cyst   . PONV (postoperative nausea and vomiting)   . Diabetes mellitus without complication   . Stroke     Past Surgical History  Procedure Laterality Date  . Cataract extraction      x 3  . Inner ear surgery      rt ear  . Eye surgery  1968, 1985, 1987    cataracts  . Inner ear surgery  1992  . Surgery - right arm      1983     Medications: Prior to Admission medications   Medication Sig Start Date End Date Taking? Authorizing Provider  allopurinol (ZYLOPRIM) 100 MG tablet Take 1 tablet (100 mg total) by mouth daily. 12/29/14  Yes Rosalita Chessman, DO  aspirin 81 MG chewable tablet Chew 1 tablet (81 mg total) by mouth daily. 01/26/15  Yes Rosalin Hawking, MD  Azelastine HCl 0.15 % SOLN PLACE 2 SPRAYS IN EACH NOSTRIL TWICE DAILY Patient taking differently: PLACE 2 SPRAYS IN EACH NOSTRIL every night 10/26/14  Yes Kathee Delton, MD  cetirizine (ZYRTEC) 10 MG tablet Take 1 tablet (10 mg total) by mouth daily. 06/06/14  Yes Rosalita Chessman, DO  cyclobenzaprine (FLEXERIL) 10 MG tablet Take 1 tablet (10 mg total) by mouth at bedtime. 02/08/15  Yes Carmen Dohmeier, MD  diazepam (VALIUM) 5 MG tablet TAKE 1 TABLET THREE TIMES A DAY 10/26/14  Yes Yvonne R Lowne, DO  FIBER SELECT GUMMIES PO Take 1 tablet by mouth daily.   Yes Historical Provider, MD  glucose blood (ONETOUCH VERIO) test strip Check blood sugar four times a day. Dx. E11.9 Onetouch Verio test strips 10/03/14  Yes Rosalita Chessman, DO  HYDROcodone-acetaminophen (HYCET) 7.5-325 mg/15 ml solution TAKE 15 ML EVERY 6 HOURS AS NEEDED FOR PAIN 01/19/15  Yes Alferd Apa Lowne, DO  hydrocortisone (ANUSOL-HC) 25 MG suppository PLACE 1 SUPPOSITORY INTO RECTUM TWICE A DAY 01/23/15  Yes Yvonne R Lowne, DO  hydrocortisone-pramoxine (ANALPRAM-HC) 2.5-1 % rectal cream PLACE 1 APPLICATION RECTALLY 3 (THREE) TIMES A DAY. 11/27/14  Yes Historical Provider, MD  hydrOXYzine (ATARAX/VISTARIL) 25 MG tablet TAKE 1 TABLET (25 MG TOTAL) BY MOUTH EVERY 8 (EIGHT) HOURS AS NEEDED FOR ANXIETY OR ITCHING. 10/31/14  Yes Historical Provider, MD  ibuprofen (ADVIL,MOTRIN) 100 MG/5ML  suspension Take 200 mg by mouth every 4 (four) hours as needed for fever.   Yes Historical Provider, MD  Lancets (FREESTYLE) lancets Use as instructed test 4 (four) times daily. Dx. 250.00 10/21/12  Yes Rosalita Chessman, DO  meclizine (ANTIVERT) 25 MG tablet Take 1 tablet (25 mg total) by mouth 3 (three) times daily as needed. Patient taking differently: Take 25 mg by mouth 3 (three) times daily as needed for dizziness.  08/25/14  Yes Brunetta Jeans, PA-C  metoprolol tartrate (LOPRESSOR) 25 MG tablet TAKE 1 TABLET TWICE A DAY, MAY CRUSH IN FOOD 03/28/14  Yes Yvonne R Lowne, DO  mometasone (ELOCON) 0.1 % ointment Apply topically 2 (two) times daily. Patient taking differently: Apply 1 application topically daily as needed (for rash).  05/26/14  Yes Hendricks Limes, MD  [facility] DELICA LANCETS FINE MISC Check blood sugar four times per day. Dx:E11.9 10/09/14  Yes Rosalita Chessman, DO  pravastatin (PRAVACHOL) 20 MG tablet Take 1 tablet (20 mg total) by mouth daily. 01/26/15  Yes Rosalin Hawking, MD  ranitidine (ZANTAC) 75 MG/5ML syrup TAKE 10 MLS BY MOUTH 2 TIMES DAILY AS NEEDED. FOR INDIGESTION. 10/02/14  Yes Historical Provider, MD  triamcinolone (NASACORT) 55 MCG/ACT AERO nasal inhaler Place 2 sprays into the nose daily. 08/25/14  Yes Brunetta Jeans, PA-C  venlafaxine XR (EFFEXOR-XR) 37.5 MG 24 hr capsule Take 1 capsule (37.5 mg total) by mouth daily. 08/25/14  Yes Yvonne R Lowne, DO  atorvastatin (LIPITOR) 10 MG tablet Take 1 tablet (10 mg total) by mouth daily. Patient not taking: Reported on 02/10/2015 10/11/14   Rosalita Chessman, DO  cyclobenzaprine (FLEXERIL) 10 MG tablet TAKE 1 TABLET (10 MG TOTAL) BY MOUTH AT BEDTIME. Patient not taking: Reported on 02/10/2015 11/13/14   Larey Seat, MD    Allergies:   Allergies  Allergen Reactions  . Amoxicillin Hives, Itching and Other (See Comments)    White tongue; ? hives  . Flonase [Fluticasone Propionate]     Nasal Sores  . Terfenadine     ? reaction     Social History:  Ambulatory  independently   Lives at home with Girlfriend     reports that he has never smoked. He has never used smokeless tobacco. He reports that he does not drink alcohol or use illicit drugs.    Family History: family history includes Breast cancer (age of onset: 63) in his mother; Heart attack in his father; Heart disease in his father; Hypertension in his brother and father; Stroke in his father. There is no history of Hyperlipidemia or Diabetes.    Physical Exam: Patient Vitals for the past 24 hrs:  BP Temp Temp src Pulse Resp SpO2  02/10/15 1800 129/88 mmHg - - 104 26 98 %  02/10/15 1630 134/97 mmHg - - 98 16 98 %  02/10/15 1552 126/82 mmHg - - 103 20 100 %  02/10/15 1456 132/83 mmHg 97.7 F (36.5 C) Oral 105 18 100 %    1. General:  in No Acute distress 2. Psychological: Alert and   Oriented 3. Head/ENT:     Dry Mucous Membranes, pale                          Head Non traumatic, neck supple                          Normal   Dentition 4. SKIN:   decreased Skin turgor,  Skin clean Dry and intact no rash 5. Heart: Regular rate and rhythm no Murmur, Rub or gallop 6. Lungs: Clear to auscultation bilaterally, no wheezes or crackles   7. Abdomen: Soft, non-tender, Non distended, lower abdominal tenderness 8. Lower extremities: no clubbing, cyanosis, or edema 9. Neurologically Grossly intact, moving all 4 extremities equally 10. MSK: Normal range of motion Per  ER rectal exam significant for large amount of blood in the rectum  body mass index is unknown because there is no weight on file.   Labs on Admission:   Results for orders placed or performed during the hospital encounter of 02/10/15 (from the past 24 hour(s))  Comprehensive metabolic panel     Status: Abnormal   Collection Time: 02/10/15  3:50 PM  Result Value Ref Range   Sodium 139 135 - 145 mmol/L   Potassium 3.5 3.5 - 5.1 mmol/L   Chloride 104  101 - 111 mmol/L   CO2 25 22 - 32  mmol/L   Glucose, Bld 130 (H) 65 - 99 mg/dL   BUN 19 6 - 20 mg/dL   Creatinine, Ser 1.37 (H) 0.61 - 1.24 mg/dL   Calcium 8.8 (L) 8.9 - 10.3 mg/dL   Total Protein 6.7 6.5 - 8.1 g/dL   Albumin 4.0 3.5 - 5.0 g/dL   AST 26 15 - 41 U/L   ALT 20 17 - 63 U/L   Alkaline Phosphatase 57 38 - 126 U/L   Total Bilirubin 0.8 0.3 - 1.2 mg/dL   GFR calc non Af Amer 57 (L) >60 mL/min   GFR calc Af Amer >60 >60 mL/min   Anion gap 10 5 - 15  CBC     Status: Abnormal   Collection Time: 02/10/15  3:50 PM  Result Value Ref Range   WBC 11.8 (H) 4.0 - 10.5 K/uL   RBC 4.09 (L) 4.22 - 5.81 MIL/uL   Hemoglobin 12.0 (L) 13.0 - 17.0 g/dL   HCT 35.6 (L) 39.0 - 52.0 %   MCV 87.0 78.0 - 100.0 fL   MCH 29.3 26.0 - 34.0 pg   MCHC 33.7 30.0 - 36.0 g/dL   RDW 12.9 11.5 - 15.5 %   Platelets 353 150 - 400 K/uL  Type and screen     Status: None   Collection Time: 02/10/15  3:50 PM  Result Value Ref Range   ABO/RH(D) O NEG    Antibody Screen NEG    Sample Expiration 02/13/2015   APTT     Status: None   Collection Time: 02/10/15  3:55 PM  Result Value Ref Range   aPTT 29 24 - 37 seconds  Protime-INR     Status: None   Collection Time: 02/10/15  3:55 PM  Result Value Ref Range   Prothrombin Time 14.5 11.6 - 15.2 seconds   INR 1.11 0.00 - 1.49  Reticulocytes     Status: Abnormal   Collection Time: 02/10/15  3:55 PM  Result Value Ref Range   Retic Ct Pct 2.0 0.4 - 3.1 %   RBC. 3.99 (L) 4.22 - 5.81 MIL/uL   Retic Count, Manual 79.8 19.0 - 186.0 K/uL    UA not obtained  Lab Results  Component Value Date   HGBA1C 6.8* 10/03/2014    CrCl cannot be calculated (Unknown ideal weight.).  BNP (last 3 results) No results for input(s): PROBNP in the last 8760 hours.  Other results:  I have pearsonaly reviewed this: ECG REPORT  Rate:100  Rhythm: ST ST&T Change: T wave flattening in lateral leads QTC 531  There were no vitals filed for this visit.   Cultures: No results found for: [place], [place],  [place], REPTSTATUS   Radiological Exams on Admission: No results found.  Chart has been reviewed  Family not  at  Bedside but Friend is at bedside who is MPOA  Although family not sure.     Assessment/Plan 54 yo male with history of essential hypertension, paroxysmal atrial fibrillation, diabetes and strokes on aspirin 81 mg for stroke prevention with known history of hemorrhoids developed significant bright red blood per rectum today associated with left lower quadrant discomfort which has stopped in the past 6 hours. Being admitted for most likely lower GI bleed.   Present on Admission:  . Essential hypertension - blood pressure stable decrease dose of metoprolol and   hold for tonight  . OSA on CPAP C Pap machine ordered  .  paroxysmal atrial fibrillation - not a anticoagulation candidate given GI bleed. will resume metoprolol tomorrow if vital stable  . Type 2 diabetes mellitus with other circulatory complications - SSI, hold by mouth meds  . Blood in stool, frank - most likely secondary to lower GI bleed. Discussed with gastroenterology Dr. Carlean Purl who will see patient in the morning. Upper GI source less likely but will make sure patient on Protonix. Order bowel rest. Dr. Carlean Purl for now will hold off on bleeding study is bleeding likely has slowed down. Will order lactic acid. If worsening abdominal pain or significant lactic acid elevation will order additional imaging otherwise Dr. Carlean Purl will perform sigmoidoscopy tomorrow and be able to visualize area of interest . make sure patient has 2 large-bore IVs  . Anemia - type and screen ordered. Continue to cycle CBC every 6 hours  . Hemorrhoids- somewhat less likely source of bleeding  Unless internal hemorrhoids involved defer to GI   dysuria  - Will order UA and urine culture   dehydration patient appears to have rising creatinine we'll rehydrate and follow creatinine Acute renal insufficiency/failure minimal rising creatinine fell  rehydrate and follow Chest pain nonspecific will cycle cardiac enzymes monitor on telemetry  EKG showing some QTC prolongation will hold off on medications that can worsen this. Monitor on telemetry repeat EKG in the morning  Prophylaxis: SCD   CODE STATUS:  FULL CODE  as per patient    Disposition:   To home once workup is complete and patient is stable  Other plan as per orders.   I have spent a total of 65 min on this admission Extra time was taken to discuss case with Dr. Roanna Banning 02/10/2015, 6:53 PM  [facility]  Pager [phone]   after 2 AM please page floor coverage PA If 7AM-7PM, please contact the day team taking care of the patient  [email or web address]  Password [email or web address]

## 2015-02-10 NOTE — ED Notes (Signed)
No lab draw, pt enroute to floor

## 2015-02-10 NOTE — ED Provider Notes (Signed)
CSN: [id number]     Arrival date & time 02/10/15  1433 History   First MD Initiated Contact with Patient 02/10/15 1538     Chief Complaint  Patient presents with  . Abdominal Pain  . Rectal Bleeding  . Diarrhea     (Consider location/radiation/quality/duration/timing/severity/associated sxs/prior Treatment) HPI Comments: The patient is a 54 year old male, he has a history of acid reflux, hypertension, hyperlipidemia, stroke, decreased hearing, reports having a normal colonoscopy several years ago and a normal sigmoidoscopy this year. He started taking a full dose aspirin one week ago at the recommendation of his neurologist secondary to his history of cerebrovascular disease. He reports over the last 24 hours that he has developed acute onset of rectal bleeding. He has associated abdominal cramping but no vomiting. There has been at least 3 large bloody bowel movements today consisting mostly of blood and relatively little stool. He denies any rectal pain, hard stools prior to this and states that the toilet bowl was filled with blood multiple times. The family member has taken pictures confirming the patient's description of his stools. He has received his colonoscopy and sigmoidoscopy in [place], that information is not available at this time. Reportedly per the patient he had history of possible stomach ulcers and anterior inflammatory's were stopped several years ago.    Patient is a 54 y.o. male presenting with abdominal pain, hematochezia, and diarrhea. The history is provided by the patient.  Abdominal Pain Associated symptoms: diarrhea and hematochezia   Rectal Bleeding Associated symptoms: abdominal pain   Diarrhea Associated symptoms: abdominal pain     Past Medical History  Diagnosis Date  . Allergy   . Stroke   . Sleep apnea   . Headache(784.0)   . Arthritis   . GERD (gastroesophageal reflux disease)   . Hypertension   . Hyperlipidemia   . Hearing loss   . Nasal  congestion   . Trouble swallowing   . Blood in stool   . Chest pain   . Leg swelling   . Constipation   . Rectal pain   . Difficulty urinating   . Thrombosed hemorrhoids   . Brain cyst   . PONV (postoperative nausea and vomiting)   . Diabetes mellitus without complication   . Stroke    Past Surgical History  Procedure Laterality Date  . Cataract extraction      x 3  . Inner ear surgery      rt ear  . Eye surgery  1968, 1985, 1987    cataracts  . Inner ear surgery  1992  . Surgery - right arm      1983   Family History  Problem Relation Age of Onset  . Breast cancer Mother 28    breast  . Stroke Father   . Heart disease Father   . Hypertension Father   . Heart attack Father   . Hypertension Brother   . Hyperlipidemia Neg Hx   . Diabetes Neg Hx    Social History  Substance Use Topics  . Smoking status: Never Smoker   . Smokeless tobacco: Never Used  . Alcohol Use: No     Comment: none    Review of Systems  Gastrointestinal: Positive for abdominal pain, diarrhea and hematochezia.  All other systems reviewed and are negative.     Allergies  Amoxicillin; Flonase; and Terfenadine  Home Medications   Prior to Admission medications   Medication Sig Start Date End Date Taking? Authorizing Provider  allopurinol (  ZYLOPRIM) 100 MG tablet Take 1 tablet (100 mg total) by mouth daily. 12/29/14  Yes Rosalita Chessman, DO  aspirin 81 MG chewable tablet Chew 1 tablet (81 mg total) by mouth daily. 01/26/15  Yes Rosalin Hawking, MD  Azelastine HCl 0.15 % SOLN PLACE 2 SPRAYS IN EACH NOSTRIL TWICE DAILY Patient taking differently: PLACE 2 SPRAYS IN EACH NOSTRIL every night 10/26/14  Yes Kathee Delton, MD  cetirizine (ZYRTEC) 10 MG tablet Take 1 tablet (10 mg total) by mouth daily. 06/06/14  Yes Rosalita Chessman, DO  cyclobenzaprine (FLEXERIL) 10 MG tablet Take 1 tablet (10 mg total) by mouth at bedtime. 02/08/15  Yes Carmen Dohmeier, MD  diazepam (VALIUM) 5 MG tablet TAKE 1 TABLET  THREE TIMES A DAY 10/26/14  Yes Yvonne R Lowne, DO  FIBER SELECT GUMMIES PO Take 1 tablet by mouth daily.   Yes Historical Provider, MD  glucose blood (ONETOUCH VERIO) test strip Check blood sugar four times a day. Dx. E11.9 Onetouch Verio test strips 10/03/14  Yes Rosalita Chessman, DO  HYDROcodone-acetaminophen (HYCET) 7.5-325 mg/15 ml solution TAKE 15 ML EVERY 6 HOURS AS NEEDED FOR PAIN 01/19/15  Yes Alferd Apa Lowne, DO  hydrocortisone (ANUSOL-HC) 25 MG suppository PLACE 1 SUPPOSITORY INTO RECTUM TWICE A DAY 01/23/15  Yes Yvonne R Lowne, DO  hydrocortisone-pramoxine (ANALPRAM-HC) 2.5-1 % rectal cream PLACE 1 APPLICATION RECTALLY 3 (THREE) TIMES A DAY. 11/27/14  Yes Historical Provider, MD  hydrOXYzine (ATARAX/VISTARIL) 25 MG tablet TAKE 1 TABLET (25 MG TOTAL) BY MOUTH EVERY 8 (EIGHT) HOURS AS NEEDED FOR ANXIETY OR ITCHING. 10/31/14  Yes Historical Provider, MD  ibuprofen (ADVIL,MOTRIN) 100 MG/5ML suspension Take 200 mg by mouth every 4 (four) hours as needed for fever.   Yes Historical Provider, MD  Lancets (FREESTYLE) lancets Use as instructed test 4 (four) times daily. Dx. 250.00 10/21/12  Yes Rosalita Chessman, DO  meclizine (ANTIVERT) 25 MG tablet Take 1 tablet (25 mg total) by mouth 3 (three) times daily as needed. Patient taking differently: Take 25 mg by mouth 3 (three) times daily as needed for dizziness.  08/25/14  Yes Brunetta Jeans, PA-C  metoprolol tartrate (LOPRESSOR) 25 MG tablet TAKE 1 TABLET TWICE A DAY, MAY CRUSH IN FOOD 03/28/14  Yes Yvonne R Lowne, DO  mometasone (ELOCON) 0.1 % ointment Apply topically 2 (two) times daily. Patient taking differently: Apply 1 application topically daily as needed (for rash).  05/26/14  Yes Hendricks Limes, MD  [facility] DELICA LANCETS FINE MISC Check blood sugar four times per day. Dx:E11.9 10/09/14  Yes Yvonne R Lowne, DO  pravastatin (PRAVACHOL) 20 MG tablet Take 1 tablet (20 mg total) by mouth daily. 01/26/15  Yes Rosalin Hawking, MD  ranitidine (ZANTAC) 75 MG/5ML  syrup TAKE 10 MLS BY MOUTH 2 TIMES DAILY AS NEEDED. FOR INDIGESTION. 10/02/14  Yes Historical Provider, MD  triamcinolone (NASACORT) 55 MCG/ACT AERO nasal inhaler Place 2 sprays into the nose daily. 08/25/14  Yes Brunetta Jeans, PA-C  venlafaxine XR (EFFEXOR-XR) 37.5 MG 24 hr capsule Take 1 capsule (37.5 mg total) by mouth daily. 08/25/14  Yes Yvonne R Lowne, DO  atorvastatin (LIPITOR) 10 MG tablet Take 1 tablet (10 mg total) by mouth daily. Patient not taking: Reported on 02/10/2015 10/11/14   Rosalita Chessman, DO  cyclobenzaprine (FLEXERIL) 10 MG tablet TAKE 1 TABLET (10 MG TOTAL) BY MOUTH AT BEDTIME. Patient not taking: Reported on 02/10/2015 11/13/14   Larey Seat, MD   BP 129/88 mmHg  Pulse 104  Temp(Src) 97.7 F (36.5 C) (Oral)  Resp 26  SpO2 98% Physical Exam  Constitutional: He appears well-developed and well-nourished. No distress.  HENT:  Head: Normocephalic and atraumatic.  Mouth/Throat: Oropharynx is clear and moist. No oropharyngeal exudate.  Eyes: Conjunctivae and EOM are normal. Pupils are equal, round, and reactive to light. Right eye exhibits no discharge. Left eye exhibits no discharge. No scleral icterus.  Neck: Normal range of motion. Neck supple. No JVD present. No thyromegaly present.  Cardiovascular: Regular rhythm, normal heart sounds and intact distal pulses.  Exam reveals no gallop and no friction rub.   No murmur heard. Tachycardia present, strong pulses at the radial arteries  Pulmonary/Chest: Effort normal and breath sounds normal. No respiratory distress. He has no wheezes. He has no rales.  Abdominal: Soft. Bowel sounds are normal. He exhibits no distension and no mass. There is tenderness (minimal periumbilical tenderness).  Genitourinary:  Large soft nontender hemorrhoid, nonbleeding, internal exam without masses or fissures, there is a large amount of gross blood in the rectal vault  Musculoskeletal: Normal range of motion. He exhibits no edema or  tenderness.  Lymphadenopathy:    He has no cervical adenopathy.  Neurological: He is alert. Coordination normal.  Skin: Skin is warm and dry. No rash noted. No erythema.  Psychiatric: He has a normal mood and affect. His behavior is normal.  Nursing note and vitals reviewed.   ED Course  Procedures (including critical care time) Labs Review Labs Reviewed  COMPREHENSIVE METABOLIC PANEL - Abnormal; Notable for the following:    Glucose, Bld 130 (*)    Creatinine, Ser 1.37 (*)    Calcium 8.8 (*)    GFR calc non Af Amer 57 (*)    All other components within normal limits  CBC - Abnormal; Notable for the following:    WBC 11.8 (*)    RBC 4.09 (*)    Hemoglobin 12.0 (*)    HCT 35.6 (*)    All other components within normal limits  RETICULOCYTES - Abnormal; Notable for the following:    RBC. 3.99 (*)    All other components within normal limits  APTT  PROTIME-INR  VITAMIN B12  FOLATE  IRON AND TIBC  FERRITIN  POC OCCULT BLOOD, ED  TYPE AND SCREEN  ABO/RH    Imaging Review No results found. I have personally reviewed and evaluated these images and lab results as part of my medical decision-making.    MDM   Final diagnoses:  Acute GI bleeding    The patient is tachycardic, likely anemic given the volume of blood loss. Needs type and screen, labs, admission, likely transfusion. We'll consult with hospitalist for admission, may need gastroenterology is severely anemic or unstable but this time has normal mental status and good blood pressure.  Discussed with admitting doctor, hospitalist will see in the emergency department, hemoglobin is slightly low, no other anticoagulative's other than aspirin. Medications given as below, no need for emergent transfusion but will contact gastroenterology for consultation.  Step down admit - ongoing bleeding and tachycardic.  Meds given in ED:  Medications  sodium chloride 0.9 % bolus 1,000 mL (1,000 mLs Intravenous New Bag/Given  02/10/15 1655)  famotidine (PEPCID) IVPB 20 mg premix (0 mg Intravenous Stopped 02/10/15 1822)  pantoprazole (PROTONIX) injection 40 mg (40 mg Intravenous Given 02/10/15 1658)      Noemi Chapel, MD 02/10/15 1851

## 2015-02-11 ENCOUNTER — Encounter (HOSPITAL_COMMUNITY)
Admission: EM | Disposition: A | Discharge status: Home / Self Care | Payer: Self-pay | Source: Home / Self Care | Attending: Emergency Medicine

## 2015-02-11 ENCOUNTER — Encounter (HOSPITAL_COMMUNITY): Payer: Self-pay | Admitting: *Deleted

## 2015-02-11 DIAGNOSIS — N289 Disorder of kidney and ureter, unspecified: Secondary | ICD-10-CM

## 2015-02-11 DIAGNOSIS — D62 Acute posthemorrhagic anemia: Secondary | ICD-10-CM | POA: Diagnosis not present

## 2015-02-11 DIAGNOSIS — E876 Hypokalemia: Secondary | ICD-10-CM | POA: Diagnosis not present

## 2015-02-11 DIAGNOSIS — K922 Gastrointestinal hemorrhage, unspecified: Secondary | ICD-10-CM

## 2015-02-11 DIAGNOSIS — K626 Ulcer of anus and rectum: Principal | ICD-10-CM

## 2015-02-11 DIAGNOSIS — K625 Hemorrhage of anus and rectum: Secondary | ICD-10-CM | POA: Diagnosis not present

## 2015-02-11 HISTORY — PX: FLEXIBLE SIGMOIDOSCOPY: SHX5431

## 2015-02-11 HISTORY — DX: Ulcer of anus and rectum: K62.6

## 2015-02-11 LAB — IRON AND TIBC
Iron: 42 ug/dL — ABNORMAL LOW (ref 45–182)
Saturation Ratios: 13 % — ABNORMAL LOW (ref 17.9–39.5)
TIBC: 315 ug/dL (ref 250–450)
UIBC: 273 ug/dL

## 2015-02-11 LAB — COMPREHENSIVE METABOLIC PANEL
ALT: 15 U/L — ABNORMAL LOW (ref 17–63)
AST: 17 U/L (ref 15–41)
Albumin: 3.2 g/dL — ABNORMAL LOW (ref 3.5–5.0)
Alkaline Phosphatase: 48 U/L (ref 38–126)
Anion gap: 5 (ref 5–15)
BUN: 16 mg/dL (ref 6–20)
CO2: 26 mmol/L (ref 22–32)
Calcium: 7.6 mg/dL — ABNORMAL LOW (ref 8.9–10.3)
Chloride: 109 mmol/L (ref 101–111)
Creatinine, Ser: 1.16 mg/dL (ref 0.61–1.24)
GFR calc Af Amer: >60 mL/min (ref >60)
GFR calc non Af Amer: >60 mL/min (ref >60)
Glucose, Bld: 135 mg/dL — ABNORMAL HIGH (ref 65–99)
Potassium: 3.2 mmol/L — ABNORMAL LOW (ref 3.5–5.1)
Sodium: 140 mmol/L (ref 135–145)
Total Bilirubin: 0.7 mg/dL (ref 0.3–1.2)
Total Protein: 5.5 g/dL — ABNORMAL LOW (ref 6.5–8.1)

## 2015-02-11 LAB — CBC
HCT: 28.5 % — ABNORMAL LOW (ref 39.0–52.0)
HCT: 31 % — ABNORMAL LOW (ref 39.0–52.0)
Hemoglobin: 9.7 g/dL — ABNORMAL LOW (ref 13.0–17.0)
Hemoglobin: 10.5 g/dL — ABNORMAL LOW (ref 13.0–17.0)
MCH: 29.9 pg (ref 26.0–34.0)
MCH: 29.8 pg (ref 26.0–34.0)
MCHC: 34 g/dL (ref 30.0–36.0)
MCHC: 33.9 g/dL (ref 30.0–36.0)
MCV: 88 fL (ref 78.0–100.0)
MCV: 88.1 fL (ref 78.0–100.0)
Platelets: 289 10*3/uL (ref 150–400)
Platelets: 326 10*3/uL (ref 150–400)
RBC: 3.24 MIL/uL — ABNORMAL LOW (ref 4.22–5.81)
RBC: 3.52 MIL/uL — ABNORMAL LOW (ref 4.22–5.81)
RDW: 13.1 % (ref 11.5–15.5)
RDW: 13.3 % (ref 11.5–15.5)
WBC: 8.7 10*3/uL (ref 4.0–10.5)
WBC: 6.9 10*3/uL (ref 4.0–10.5)

## 2015-02-11 LAB — VITAMIN B12: Vitamin B-12: 672 pg/mL (ref 180–914)

## 2015-02-11 LAB — PHOSPHORUS: Phosphorus: 3.5 mg/dL (ref 2.5–4.6)

## 2015-02-11 LAB — GLUCOSE, CAPILLARY
Glucose-Capillary: 123 mg/dL — ABNORMAL HIGH (ref 65–99)
Glucose-Capillary: 128 mg/dL — ABNORMAL HIGH (ref 65–99)
Glucose-Capillary: 131 mg/dL — ABNORMAL HIGH (ref 65–99)
Glucose-Capillary: 140 mg/dL — ABNORMAL HIGH (ref 65–99)
Glucose-Capillary: 141 mg/dL — ABNORMAL HIGH (ref 65–99)

## 2015-02-11 LAB — TROPONIN I
Troponin I: <0.03 ng/mL (ref <0.031)
Troponin I: <0.03 ng/mL (ref <0.031)

## 2015-02-11 LAB — SODIUM, URINE, RANDOM: Sodium, Ur: 63 mmol/L

## 2015-02-11 LAB — FOLATE: Folate: 22.6 ng/mL (ref >5.9)

## 2015-02-11 LAB — MAGNESIUM: Magnesium: 1.9 mg/dL (ref 1.7–2.4)

## 2015-02-11 LAB — CREATININE, URINE, RANDOM: Creatinine, Urine: 97.69 mg/dL

## 2015-02-11 LAB — TSH: TSH: 2.314 u[IU]/mL (ref 0.350–4.500)

## 2015-02-11 LAB — FERRITIN: Ferritin: 20 ng/mL — ABNORMAL LOW (ref 24–336)

## 2015-02-11 SURGERY — SIGMOIDOSCOPY, FLEXIBLE
Anesthesia: Moderate Sedation

## 2015-02-11 MED ORDER — POTASSIUM CHLORIDE CRYS ER 20 MEQ PO TBCR
40.0000 meq | EXTENDED_RELEASE_TABLET | Freq: Once | ORAL | Status: AC
  Administered 2015-02-11: 40 meq via ORAL
  Filled 2015-02-11: qty 2

## 2015-02-11 MED ORDER — FENTANYL CITRATE (PF) 100 MCG/2ML IJ SOLN
INTRAMUSCULAR | Status: DC | PRN
  Administered 2015-02-11 (×2): 25 ug via INTRAVENOUS

## 2015-02-11 MED ORDER — INSULIN ASPART 100 UNIT/ML ~~LOC~~ SOLN
0.0000 [IU] | Freq: Three times a day (TID) | SUBCUTANEOUS | Status: DC
  Administered 2015-02-12: 1 [IU] via SUBCUTANEOUS
  Administered 2015-02-12: 3 [IU] via SUBCUTANEOUS

## 2015-02-11 MED ORDER — PRAVASTATIN SODIUM 20 MG PO TABS
20.0000 mg | ORAL_TABLET | Freq: Every day | ORAL | Status: DC
  Administered 2015-02-11 – 2015-02-12 (×2): 20 mg via ORAL
  Filled 2015-02-11 (×2): qty 1

## 2015-02-11 MED ORDER — METOPROLOL TARTRATE 12.5 MG HALF TABLET
12.5000 mg | ORAL_TABLET | Freq: Once | ORAL | Status: AC
  Administered 2015-02-11: 12.5 mg via ORAL
  Filled 2015-02-11: qty 1

## 2015-02-11 MED ORDER — DIPHENHYDRAMINE HCL 50 MG/ML IJ SOLN
INTRAMUSCULAR | Status: AC
  Filled 2015-02-11: qty 1

## 2015-02-11 MED ORDER — HYDRALAZINE HCL 20 MG/ML IJ SOLN
10.0000 mg | Freq: Four times a day (QID) | INTRAMUSCULAR | Status: DC | PRN
  Administered 2015-02-11: 10 mg via INTRAVENOUS

## 2015-02-11 MED ORDER — DEXTROSE-NACL 5-0.9 % IV SOLN
INTRAVENOUS | Status: AC
  Administered 2015-02-11 (×2): via INTRAVENOUS

## 2015-02-11 MED ORDER — MIDAZOLAM HCL 10 MG/2ML IJ SOLN
INTRAMUSCULAR | Status: DC | PRN
  Administered 2015-02-11: 1 mg via INTRAVENOUS
  Administered 2015-02-11: 2 mg via INTRAVENOUS

## 2015-02-11 MED ORDER — METOPROLOL TARTRATE 25 MG PO TABS
25.0000 mg | ORAL_TABLET | Freq: Two times a day (BID) | ORAL | Status: DC
  Administered 2015-02-11 – 2015-02-12 (×2): 25 mg via ORAL
  Filled 2015-02-11 (×2): qty 1

## 2015-02-11 MED ORDER — HYDROCODONE-ACETAMINOPHEN 5-325 MG PO TABS
1.0000 | ORAL_TABLET | Freq: Four times a day (QID) | ORAL | Status: DC | PRN
  Administered 2015-02-11: 1 via ORAL
  Filled 2015-02-11: qty 1

## 2015-02-11 MED ORDER — MORPHINE SULFATE (PF) 2 MG/ML IV SOLN: 1.0000 mg | INTRAVENOUS | Status: DC | PRN

## 2015-02-11 MED ORDER — FENTANYL CITRATE (PF) 100 MCG/2ML IJ SOLN
INTRAMUSCULAR | Status: AC
  Filled 2015-02-11: qty 2

## 2015-02-11 MED ORDER — AZELASTINE HCL 0.15 % NA SOLN
2.0000 | Freq: Every day | NASAL | Status: DC
  Filled 2015-02-11: qty 30

## 2015-02-11 MED ORDER — HYDROXYZINE HCL 25 MG PO TABS
25.0000 mg | ORAL_TABLET | Freq: Three times a day (TID) | ORAL | Status: DC | PRN
Start: 2015-02-11 — End: 2015-02-12
  Administered 2015-02-11: 25 mg via ORAL
  Filled 2015-02-11: qty 1

## 2015-02-11 MED ORDER — HYDRALAZINE HCL 20 MG/ML IJ SOLN
INTRAMUSCULAR | Status: AC
  Filled 2015-02-11: qty 1

## 2015-02-11 MED ORDER — AZELASTINE HCL 0.1 % NA SOLN
2.0000 | Freq: Every day | NASAL | Status: DC
  Administered 2015-02-11: 2 via NASAL
  Filled 2015-02-11: qty 30

## 2015-02-11 MED ORDER — MIDAZOLAM HCL 5 MG/ML IJ SOLN
INTRAMUSCULAR | Status: AC
  Filled 2015-02-11: qty 2

## 2015-02-11 NOTE — Consult Note (Signed)
Referring Provider: No ref. provider found Primary Care Physician:  [name], DO Primary Gastroenterologist:  GI in [facility]  Reason for Consultation: GI bleeding  HPI: Chase [name] is a 54 y.o. male has a past medical history of "mini strokes", sleep apnea, hypertension, hyperlipidemia, diabetes mellitus.  The patient presented with rectal bleeding that began yesterday morning.  Patient woke up in AM and noted blood and stool in his clothes. He reports some abdominal cramping.  He proceeded to pass blood several times (10-12 times) in large amounts with clots.  Last BM was prior to arrival to ER around 2:30 PM on 8/27.  BUN 19.  Hgb 14.6 grams 4 months ago.  Down to 9.7 grams this AM and stable/improved since then.  No blood transfusions at this point.  Never had this degree of bleeding in the past.  No NSAID's or blood thinners.  Takes on ASA 81 mg, but not every day.  Was seen in our office for the first time on 10/20/2014 by our NP at which time he was offered hemorrhoid banding but said he could not afford this.  He then returned to [facility] and had flexible sigmoidoscopy 5/17//2016 at [facility], which showed mixed internal and external hemorrhoids; small prolapsed internal hemorrhoids and grade 3 external hemorrhoids (one thrombosed).  Since that time he has undergone in-office banding procedure for internal hemorrhoids in [place].  Last banding was 01/19/2015.  Patient had a normal complete colonoscopy Sept 2011 for rectal bleeding (only hemorrhoids). EGD (normal) done same day for dysphagia.  Past Medical History  Diagnosis Date  . Allergy   . Stroke   . Sleep apnea   . Headache(784.0)   . Arthritis   . GERD (gastroesophageal reflux disease)   . Hypertension   . Hyperlipidemia   . Hearing loss   . Nasal congestion   . Trouble swallowing   . Blood in stool   . Chest pain   . Leg swelling   . Constipation   . Rectal pain   . Difficulty  urinating   . Thrombosed hemorrhoids   . Brain cyst   . PONV (postoperative nausea and vomiting)   . Diabetes mellitus without complication   . Stroke     Past Surgical History  Procedure Laterality Date  . Cataract extraction      x 3  . Inner ear surgery      rt ear  . Eye surgery  1968, 1985, 1987    cataracts  . Inner ear surgery  1992  . Surgery - right arm      1983    Prior to Admission medications   Medication Sig Start Date End Date Taking? Authorizing Provider  allopurinol (ZYLOPRIM) 100 MG tablet Take 1 tablet (100 mg total) by mouth daily. 12/29/14  Yes Rosalita Chessman, DO  aspirin 81 MG chewable tablet Chew 1 tablet (81 mg total) by mouth daily. 01/26/15  Yes Rosalin Hawking, MD  Azelastine HCl 0.15 % SOLN PLACE 2 SPRAYS IN EACH NOSTRIL TWICE DAILY Patient taking differently: PLACE 2 SPRAYS IN EACH NOSTRIL every night 10/26/14  Yes Kathee Delton, MD  cetirizine (ZYRTEC) 10 MG tablet Take 1 tablet (10 mg total) by mouth daily. 06/06/14  Yes Rosalita Chessman, DO  cyclobenzaprine (FLEXERIL) 10 MG tablet Take 1 tablet (10 mg total) by mouth at bedtime. 02/08/15  Yes Carmen Dohmeier, MD  diazepam (VALIUM) 5 MG tablet TAKE 1 TABLET THREE TIMES A DAY 10/26/14  Yes Rosalita Chessman, DO  FIBER SELECT GUMMIES PO Take 1 tablet by mouth daily.   Yes Historical Provider, MD  glucose blood (ONETOUCH VERIO) test strip Check blood sugar four times a day. Dx. E11.9 Onetouch Verio test strips 10/03/14  Yes Rosalita Chessman, DO  HYDROcodone-acetaminophen (HYCET) 7.5-325 mg/15 ml solution TAKE 15 ML EVERY 6 HOURS AS NEEDED FOR PAIN 01/19/15  Yes Alferd Apa Lowne, DO  hydrocortisone (ANUSOL-HC) 25 MG suppository PLACE 1 SUPPOSITORY INTO RECTUM TWICE A DAY 01/23/15  Yes Yvonne R Lowne, DO  hydrocortisone-pramoxine (ANALPRAM-HC) 2.5-1 % rectal cream PLACE 1 APPLICATION RECTALLY 3 (THREE) TIMES A DAY. 11/27/14  Yes Historical Provider, MD  hydrOXYzine (ATARAX/VISTARIL) 25 MG tablet TAKE 1 TABLET (25 MG TOTAL) BY  MOUTH EVERY 8 (EIGHT) HOURS AS NEEDED FOR ANXIETY OR ITCHING. 10/31/14  Yes Historical Provider, MD  ibuprofen (ADVIL,MOTRIN) 100 MG/5ML suspension Take 200 mg by mouth every 4 (four) hours as needed for fever.   Yes Historical Provider, MD  Lancets (FREESTYLE) lancets Use as instructed test 4 (four) times daily. Dx. 250.00 10/21/12  Yes Rosalita Chessman, DO  meclizine (ANTIVERT) 25 MG tablet Take 1 tablet (25 mg total) by mouth 3 (three) times daily as needed. Patient taking differently: Take 25 mg by mouth 3 (three) times daily as needed for dizziness.  08/25/14  Yes Brunetta Jeans, PA-C  metoprolol tartrate (LOPRESSOR) 25 MG tablet TAKE 1 TABLET TWICE A DAY, MAY CRUSH IN FOOD 03/28/14  Yes Yvonne R Lowne, DO  mometasone (ELOCON) 0.1 % ointment Apply topically 2 (two) times daily. Patient taking differently: Apply 1 application topically daily as needed (for rash).  05/26/14  Yes Hendricks Limes, MD  [facility] DELICA LANCETS FINE MISC Check blood sugar four times per day. Dx:E11.9 10/09/14  Yes Yvonne R Lowne, DO  pravastatin (PRAVACHOL) 20 MG tablet Take 1 tablet (20 mg total) by mouth daily. 01/26/15  Yes Rosalin Hawking, MD  ranitidine (ZANTAC) 75 MG/5ML syrup TAKE 10 MLS BY MOUTH 2 TIMES DAILY AS NEEDED. FOR INDIGESTION. 10/02/14  Yes Historical Provider, MD  triamcinolone (NASACORT) 55 MCG/ACT AERO nasal inhaler Place 2 sprays into the nose daily. 08/25/14  Yes Brunetta Jeans, PA-C  venlafaxine XR (EFFEXOR-XR) 37.5 MG 24 hr capsule Take 1 capsule (37.5 mg total) by mouth daily. 08/25/14  Yes Yvonne R Lowne, DO  atorvastatin (LIPITOR) 10 MG tablet Take 1 tablet (10 mg total) by mouth daily. Patient not taking: Reported on 02/10/2015 10/11/14   Rosalita Chessman, DO  cyclobenzaprine (FLEXERIL) 10 MG tablet TAKE 1 TABLET (10 MG TOTAL) BY MOUTH AT BEDTIME. Patient not taking: Reported on 02/10/2015 11/13/14   Larey Seat, MD    Current Facility-Administered Medications  Medication Dose Route Frequency  Provider Last Rate Last Dose  . acetaminophen (TYLENOL) tablet 650 mg  650 mg Oral Q6H PRN Toy Baker, MD   650 mg at 02/11/15 0815   Or  . acetaminophen (TYLENOL) suppository 650 mg  650 mg Rectal Q6H PRN Toy Baker, MD      . allopurinol (ZYLOPRIM) tablet 100 mg  100 mg Oral Daily Toy Baker, MD   100 mg at 02/10/15 2135  . cyclobenzaprine (FLEXERIL) tablet 10 mg  10 mg Oral QHS Toy Baker, MD   10 mg at 02/10/15 2135  . dextrose 5 %-0.9 % sodium chloride infusion   Intravenous Continuous Robbie Lis, MD 100 mL/hr at 02/10/15 2200    . diazepam (VALIUM) tablet 5 mg  5 mg Oral TID Toy Baker, MD   5 mg at 02/10/15 2135  . HYDROcodone-acetaminophen (NORCO/VICODIN) 5-325 MG per tablet 1-2 tablet  1-2 tablet Oral Q4H PRN Toy Baker, MD      . insulin aspart (novoLOG) injection 0-9 Units  0-9 Units Subcutaneous 6 times per day Toy Baker, MD   1 Units at 02/11/15 0525  . loratadine (CLARITIN) tablet 10 mg  10 mg Oral Daily Toy Baker, MD   10 mg at 02/10/15 2135  . metoprolol tartrate (LOPRESSOR) tablet 12.5 mg  12.5 mg Oral BID Toy Baker, MD      . morphine 2 MG/ML injection 2 mg  2 mg Intravenous Q3H PRN Toy Baker, MD      . pantoprazole (PROTONIX) injection 40 mg  40 mg Intravenous Q12H Toy Baker, MD   40 mg at 02/10/15 2135  . sodium chloride 0.9 % injection 3 mL  3 mL Intravenous Q12H Toy Baker, MD   3 mL at 02/10/15 2135  . venlafaxine XR (EFFEXOR-XR) 24 hr capsule 37.5 mg  37.5 mg Oral Daily Toy Baker, MD   37.5 mg at 02/10/15 2135    Allergies as of 02/10/2015 - Review Complete 02/10/2015  Allergen Reaction Noted  . Amoxicillin Hives, Itching, and Other (See Comments) 12/23/2006  . Flonase [fluticasone propionate]  08/25/2014  . Terfenadine  12/09/2007    Family History  Problem Relation Age of Onset  . Breast cancer Mother 34    breast  . Stroke Father   . Heart disease  Father   . Hypertension Father   . Heart attack Father   . Hypertension Brother   . Hyperlipidemia Neg Hx   . Diabetes Neg Hx     Social History   Social History  . Marital Status: Single    Spouse Name: N/A  . Number of Children: 0  . Years of Education: N/A   Occupational History  . Not on file.   Social History Main Topics  . Smoking status: Never Smoker   . Smokeless tobacco: Never Used  . Alcohol Use: No     Comment: none  . Drug Use: No  . Sexual Activity: No   Other Topics Concern  . Not on file   Social History Narrative   No caffeine intake except for chocolate.    Review of Systems: Ten point ROS is O/W negative except as mentioned in HPI.  Physical Exam: Vital signs in last 24 hours: Temp:  [97.3 F (36.3 C)-97.7 F (36.5 C)] 97.3 F (36.3 C) (08/28 0758) Pulse Rate:  [85-105] 89 (08/28 0600) Resp:  [13-26] 15 (08/28 0600) BP: (126-166)/(82-102) 156/98 mmHg (08/28 0600) SpO2:  [95 %-100 %] 97 % (08/28 0600) Weight:  [192 lb 10.9 oz (87.4 kg)] 192 lb 10.9 oz (87.4 kg) (08/27 2000) Last BM Date: 02/10/15 General:  Alert, Well-developed, well-nourished, pleasant and cooperative in NAD Head:  Normocephalic and atraumatic. Eyes:  Sclera clear, no icterus.  Conjunctiva pink. Ears:  Normal auditory acuity. Mouth:  No deformity or lesions.   Lungs:  Clear throughout to auscultation.  No wheezes, crackles, or rhonchi.  Heart:  Regular rate and rhythm; no murmurs, clicks, rubs, or gallops. Abdomen:  Soft, non-distended.  BS present.  Mild lower abdominal TTP.   Rectal:  Not performed. Msk:  Symmetrical without gross deformities. Pulses:  Normal pulses noted. Extremities:  Without clubbing or edema. Neurologic:  Alert and oriented x 4; grossly normal neurologically. Skin:  Intact without significant  lesions or rashes. Psych:  Alert and cooperative. Normal mood and affect.   Lab Results:  Recent Labs  02/10/15 1550 02/10/15 2035 02/11/15 0315    WBC 11.8* 10.0 8.7  HGB 12.0* 10.6* 9.7*  HCT 35.6* 31.5* 28.5*  PLT 353 320 289   BMET  Recent Labs  02/10/15 1550 02/11/15 0315  NA 139 140  K 3.5 3.2*  CL 104 109  CO2 25 26  GLUCOSE 130* 135*  BUN 19 16  CREATININE 1.37* 1.16  CALCIUM 8.8* 7.6*   LFT  Recent Labs  02/11/15 0315  PROT 5.5*  ALBUMIN 3.2*  AST 17  ALT 15*  ALKPHOS 48  BILITOT 0.7   PT/INR  Recent Labs  02/10/15 1555  LABPROT 14.5  INR 1.11   IMPRESSION:  -LGIB:  Had recent hemorrhoid banding on 8/5.  Need to rule out ulcer, etc from that.  Never had this degree of bleeding in the past. -ABLA:  Hgb 14.6 grams 4 months ago.  Down to 9.7 grams this AM and stable/improved since then.  No blood transfusions at this point.  PLAN: -Flex sig today. -Monitor Hgb and transfuse if needed.  ZEHR, JESSICA D.  02/11/2015, 9:04 AM  Pager number [phone]   [facility] Attending  I have also seen and assessed the patient and agree with the advanced practitioner's assessment and plan. The risks and benefits as well as alternatives of endoscopic procedure(s) have been discussed and reviewed. All questions answered. The patient agrees to proceed.  Gatha Mayer, MD, [facility] [phone] (pager) 02/11/2015 12:48 PM

## 2015-02-11 NOTE — Progress Notes (Signed)
Tap water enema given 500cc infused x5. Large bloody clots and bloody drainage returned. Last output was pink tinged. Pt dd not report any pain during enema and all VS remain stable.

## 2015-02-11 NOTE — Progress Notes (Signed)
Pt has had c/o of increased urgency, frequency, and dysuria. This morning pt had outputs of 400cc at 0800, 350cc at 0916, and 400cc at 0952. Pt had some abdominal distention and tenderness. Bladder scan was done at 0934 with a volume of 560. MD is aware.

## 2015-02-11 NOTE — Progress Notes (Signed)
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 In ED, noted to have nonbleeding external hemorrhoids, hemoglobin down to 12 from 14, creatinine 1.37. Admitted to stepdown unit for further evaluation and management. He complained of transient chest pain.   Assessment/Plan:  Acute lower GI bleed/BRBPR - DD: Hemorrhoids, diverticulosis versus other etiology - States that he has not had a BM since 2 PM on 8/27. Abated for now-seems to be self limiting. - As per report, prior history of hemorrhoidal bleeding 2 years ago. Sigmoidoscopy 10/31/14: Prolapsed internal and external hemorrhoids. Normal EGD and colonoscopy in 2011 - Lower GI consulted and input pending.? Repeat sigmoidoscopy versus watchful waiting.  -Aspirin held.  Acute posthemorrhagic anemia - Secondary to rectal bleeding - Hemoglobin seems to have stabilized over the last 12 hours. Follow CBC in a.m. - Transfuse if hemoglobin less than 7 g per DL  Urinary retention - Probably from prostatomegaly - In and out cath for now but if persists, may have to place Foley catheter, start Flomax and outpatient follow-up with urology.  Hypokalemia - Replace and follow  Hypertension - Mildly uncontrolled. Increase metoprolol to home dose.  Acute kidney injury - Resolved  Type II DM - Controlled on SSI.  CVAs with residual speech difficulties - Aspirin on hold secondary to rectal bleeding  Atypical Chest pain  - Resolved  - Troponin 3 negative  - EKG without acute findings. Monitor     Prolonged QTC - 530 milliseconds on admission. - Monitor on telemetry. Replace potassium. Avoid precipitating medications. Follow EKG.     DVT prophylaxis: SCDs  Code Status: Full Family Communication: None at bedside Disposition Plan: Monitor in step down unit for additional 24 hours   Consultants:  [facility]   Procedures:  In and out cath as needed   Antibiotics:  None    Subjective: Difficult history from patient secondary to memory and speech difficulties from strokes. No BM since 2 PM on 8/27. As per nursing, difficulty voiding and dysuria (urine microscopy-no UTI features). No chest pain.   Objective: Filed Vitals:   02/11/15 0758 02/11/15 0800 02/11/15 0900 02/11/15 1000  BP:  183/116 176/101 151/98  Pulse:  95 95 101  Temp: 97.3 F (36.3 C)     TempSrc: Oral     Resp:  30 33 16  Height:      Weight:      SpO2:  97% 100% 100%    Intake/Output Summary (Last 24 hours) at 02/11/15 1026 Last data filed at 02/11/15 1000  Gross per 24 hour  Intake   3300 ml  Output   1450 ml  Net   1850 ml   Filed Weights   02/10/15 2000  Weight: 87.4 kg (192 lb 10.9 oz)     Exam:  General exam: Pleasant middle-aged male lying comfortably in bed  Respiratory system: Clear. No increased work of breathing. Cardiovascular system: S1 & S2 heard, RRR. No JVD, murmurs, gallops, clicks or pedal edema. telemetry: Sinus rhythm  Gastrointestinal system: Abdomen is nondistended, soft and nontender.  Palpable bladder. Normal bowel sounds heard. Central  nervous system: Alert and oriented. No focal neurological deficits. Extremities: Symmetric 5 x 5 power.   Data Reviewed: Basic Metabolic Panel:  Recent Labs Lab 02/10/15 1550 02/11/15 0315  NA 139 140  K 3.5 3.2*  CL 104 109  CO2 25 26  GLUCOSE 130* 135*  BUN 19 16  CREATININE 1.37* 1.16  CALCIUM 8.8* 7.6*  MG  --  1.9  PHOS  --  3.5   Liver Function Tests:  Recent Labs Lab 02/10/15 1550 02/11/15 0315   AST 26 17  ALT 20 15*  ALKPHOS 57 48  BILITOT 0.8 0.7  PROT 6.7 5.5*  ALBUMIN 4.0 3.2*   No results for input(s): LIPASE, AMYLASE in the last 168 hours. No results for input(s): AMMONIA in the last 168 hours. CBC:  Recent Labs Lab 02/10/15 1550 02/10/15 2035 02/11/15 0315 02/11/15 1015  WBC 11.8* 10.0 8.7 6.9  HGB 12.0* 10.6* 9.7* 10.5*  HCT 35.6* 31.5* 28.5* 31.0*  MCV 87.0 87.7 88.0 88.1  PLT 353 320 289 326   Cardiac Enzymes:  Recent Labs Lab 02/10/15 2035 02/11/15 0315 02/11/15 0719  TROPONINI <0.03 <0.03 <0.03   BNP (last 3 results) No results for input(s): PROBNP in the last 8760 hours. CBG:  Recent Labs Lab 02/10/15 2148 02/11/15 0020 02/11/15 0420  GLUCAP 61* 128* 131*    Recent Results (from the past 240 hour(s))  MRSA PCR Screening     Status: None   Collection Time: 02/10/15  8:09 PM  Result Value Ref Range Status   MRSA by PCR NEGATIVE NEGATIVE Final    Comment:        The GeneXpert MRSA Assay (FDA approved for NASAL specimens only), is one component of a comprehensive MRSA colonization surveillance program. It is not intended to diagnose MRSA infection nor to guide or monitor treatment for MRSA infections.          Studies: No results found.      Scheduled Meds: . allopurinol  100 mg Oral Daily  . cyclobenzaprine  10 mg Oral QHS  . diazepam  5 mg Oral TID  . insulin aspart  0-9 Units Subcutaneous 6 times per day  . loratadine  10 mg Oral Daily  . metoprolol tartrate  12.5 mg Oral BID  . pantoprazole (PROTONIX) IV  40 mg Intravenous Q12H  . sodium chloride  3 mL Intravenous Q12H  . venlafaxine XR  37.5 mg Oral Daily   Continuous Infusions: . dextrose 5 % and 0.9% NaCl 100 mL/hr at 02/10/15 2200    Active Problems:   OSA on CPAP   Persistent atrial fibrillation   Essential hypertension   Type 2 diabetes mellitus with other circulatory complications   Blood in stool, frank   Anemia   Acute renal insufficiency    Hemorrhoids   GI bleed   Acute GI bleeding    Time spent: 62 minutes    Jahliyah Trice, MD, FACP, FHM. [facility] Pager [phone]  If 7PM-7AM, please contact night-coverage [email or web address] Password [email or web address] 02/11/2015, 10:26 AM    LOS: 1 day

## 2015-02-11 NOTE — Op Note (Signed)
[facility] [place], [address]   FLEXIBLE SIGMOIDOSCOPY PROCEDURE REPORT  PATIENT: Chase, [name]  MR#: [id number] BIRTHDATE: 05-09-1961 , 55  yrs. old GENDER: male ENDOSCOPIST: Gatha Mayer, MD, [facility] PROCEDURE DATE:  02/11/2015 PROCEDURE:   Sigmoidoscopy with control of bleeding ASA CLASS:   Class II INDICATIONS:lower GI bleed after hemorrhoid banding. MEDICATIONS: Fentanyl 50 mcg IV and Versed 3 mg IV  DESCRIPTION OF PROCEDURE:   After the risks benefits and alternatives of the procedure were thoroughly explained, informed consent was obtained.  Digital exam revealed no abnormalities of the rectum. The     endoscope was introduced through the anus  and advanced to the sigmoid colon , The exam was Without limitations. The quality of the prep was The overall prep quality was excellent. . Estimated blood loss is zero unless otherwise noted in this procedure report. The instrument was then slowly withdrawn as the mucosa was fully examined.         COLON FINDINGS: 1) Distal rectal ulcer from banding with non-bleeding visible vessel.  2 clips applied to stop further bleeding. 2) otherwise normal - no blood above rectum.    Retroflexion was not performed due to a narrow rectal vault.    The scope was then withdrawn from the patient and the procedure terminated.  COMPLICATIONS: There were no immediate complications.  ENDOSCOPIC IMPRESSION: 1) Distal rectal ulcer from banding with non-bleeding visible vessel.  2 clips applied to stop further bleeding. 2) otherwise normal - no blood above rectum  RECOMMENDATIONS: 1) Carb mod diet 2) to Med-surg bed should be ok today 3) Home tomorrow if ok - needs f/u with [facility] MD that did the banding    eSigned:  Gatha Mayer, MD, [facility] 02/11/2015 1:30 PM   CC:The Patient

## 2015-02-12 DIAGNOSIS — K626 Ulcer of anus and rectum: Secondary | ICD-10-CM | POA: Diagnosis not present

## 2015-02-12 DIAGNOSIS — I481 Persistent atrial fibrillation: Secondary | ICD-10-CM | POA: Diagnosis not present

## 2015-02-12 DIAGNOSIS — K625 Hemorrhage of anus and rectum: Secondary | ICD-10-CM

## 2015-02-12 DIAGNOSIS — I10 Essential (primary) hypertension: Secondary | ICD-10-CM | POA: Diagnosis not present

## 2015-02-12 DIAGNOSIS — N289 Disorder of kidney and ureter, unspecified: Secondary | ICD-10-CM | POA: Diagnosis not present

## 2015-02-12 DIAGNOSIS — E1159 Type 2 diabetes mellitus with other circulatory complications: Secondary | ICD-10-CM | POA: Diagnosis not present

## 2015-02-12 DIAGNOSIS — D62 Acute posthemorrhagic anemia: Secondary | ICD-10-CM | POA: Diagnosis not present

## 2015-02-12 HISTORY — DX: Hemorrhage of anus and rectum: K62.5

## 2015-02-12 LAB — BASIC METABOLIC PANEL
Anion gap: 9 (ref 5–15)
BUN: 11 mg/dL (ref 6–20)
CO2: 23 mmol/L (ref 22–32)
Calcium: 8.1 mg/dL — ABNORMAL LOW (ref 8.9–10.3)
Chloride: 109 mmol/L (ref 101–111)
Creatinine, Ser: 1.02 mg/dL (ref 0.61–1.24)
GFR calc Af Amer: >60 mL/min (ref >60)
GFR calc non Af Amer: >60 mL/min (ref >60)
Glucose, Bld: 134 mg/dL — ABNORMAL HIGH (ref 65–99)
Potassium: 3.5 mmol/L (ref 3.5–5.1)
Sodium: 141 mmol/L (ref 135–145)

## 2015-02-12 LAB — CBC
HCT: 29.1 % — ABNORMAL LOW (ref 39.0–52.0)
Hemoglobin: 9.4 g/dL — ABNORMAL LOW (ref 13.0–17.0)
MCH: 28.7 pg (ref 26.0–34.0)
MCHC: 32.3 g/dL (ref 30.0–36.0)
MCV: 88.7 fL (ref 78.0–100.0)
Platelets: 283 10*3/uL (ref 150–400)
RBC: 3.28 MIL/uL — ABNORMAL LOW (ref 4.22–5.81)
RDW: 13.3 % (ref 11.5–15.5)
WBC: 8.2 10*3/uL (ref 4.0–10.5)

## 2015-02-12 LAB — GLUCOSE, CAPILLARY
Glucose-Capillary: 115 mg/dL — ABNORMAL HIGH (ref 65–99)
Glucose-Capillary: 145 mg/dL — ABNORMAL HIGH (ref 65–99)
Glucose-Capillary: 132 mg/dL — ABNORMAL HIGH (ref 65–99)

## 2015-02-12 LAB — URINE CULTURE: Culture: NO GROWTH

## 2015-02-12 LAB — HEMOGLOBIN A1C
Hgb A1c MFr Bld: 6.8 % — ABNORMAL HIGH (ref 4.8–5.6)
Mean Plasma Glucose: 148 mg/dL

## 2015-02-12 MED ORDER — HYDROCORTISONE ACE-PRAMOXINE 2.5-1 % RE CREA: TOPICAL_CREAM | Freq: Three times a day (TID) | RECTAL | Status: DC | PRN

## 2015-02-12 MED ORDER — AZELASTINE HCL 0.15 % NA SOLN: NASAL | Status: DC

## 2015-02-12 MED ORDER — POTASSIUM CHLORIDE CRYS ER 20 MEQ PO TBCR
40.0000 meq | EXTENDED_RELEASE_TABLET | Freq: Once | ORAL | Status: AC
  Administered 2015-02-12: 40 meq via ORAL
  Filled 2015-02-12: qty 2

## 2015-02-12 MED ORDER — AMLODIPINE BESYLATE 5 MG PO TABS
5.0000 mg | ORAL_TABLET | Freq: Every day | ORAL | Status: DC
  Administered 2015-02-12: 5 mg via ORAL
  Filled 2015-02-12: qty 1

## 2015-02-12 MED ORDER — TAMSULOSIN HCL 0.4 MG PO CAPS: 0.4000 mg | ORAL_CAPSULE | Freq: Every day | ORAL | Status: DC

## 2015-02-12 MED ORDER — AMLODIPINE BESYLATE 5 MG PO TABS: 5.0000 mg | ORAL_TABLET | Freq: Every day | ORAL | Status: DC

## 2015-02-12 MED ORDER — DIAZEPAM 5 MG PO TABS: 5.0000 mg | ORAL_TABLET | Freq: Three times a day (TID) | ORAL | Status: DC

## 2015-02-12 MED ORDER — MOMETASONE FUROATE 0.1 % EX OINT: 1.0000 "application " | TOPICAL_OINTMENT | Freq: Every day | CUTANEOUS | Status: DC | PRN

## 2015-02-12 MED ORDER — TAMSULOSIN HCL 0.4 MG PO CAPS
0.4000 mg | ORAL_CAPSULE | Freq: Every day | ORAL | Status: DC
  Administered 2015-02-12: 0.4 mg via ORAL
  Filled 2015-02-12: qty 1

## 2015-02-12 NOTE — Progress Notes (Signed)
MD aware of elevated BPs. Patient asymptomatic, feeling slightly anxious. Patient has a BP machine at home and advised to check BP at home. If BP remains above 160/100 patient advised to call PCP per MD.

## 2015-02-12 NOTE — Progress Notes (Signed)
Discharge instructions explained to patient in depth. All questions answered thoroughly. Patient discharged home with sister and friend.

## 2015-02-12 NOTE — Discharge Instructions (Signed)
Rectal Bleeding °Rectal bleeding is when blood passes out of the anus. It is usually a sign that something is wrong. It may not be serious, but it should always be evaluated. Rectal bleeding may present as bright red blood or extremely dark stools. The color may range from dark red or maroon to black (like tar). It is important that the cause of rectal bleeding be identified so treatment can be started and the problem corrected. °CAUSES  °· Hemorrhoids. These are enlarged (dilated) blood vessels or veins in the anal or rectal area. °· Fistulas. These are abnormal, burrowing channels that usually run from inside the rectum to the skin around the anus. They can bleed. °· Anal fissures. This is a tear in the tissue of the anus. Bleeding occurs with bowel movements. °· Diverticulosis. This is a condition in which pockets or sacs project from the bowel wall. Occasionally, the sacs can bleed. °· Diverticulitis. This is an infection involving diverticulosis of the colon. °· Proctitis and colitis. These are conditions in which the rectum, colon, or both, can become inflamed and pitted (ulcerated). °· Polyps and cancer. Polyps are non-cancerous (benign) growths in the colon that may bleed. Certain types of polyps turn into cancer. °· Protrusion of the rectum. Part of the rectum can project from the anus and bleed. °· Certain medicines. °· Intestinal infections. °· Blood vessel abnormalities. °HOME CARE INSTRUCTIONS °· Eat a high-fiber diet to keep your stool soft. °· Limit activity. °· Drink enough fluids to keep your urine clear or pale yellow. °· Warm baths may be useful to soothe rectal pain. °· Follow up with your caregiver as directed. °SEEK IMMEDIATE MEDICAL CARE IF: °· You develop increased bleeding. °· You have black or dark red stools. °· You vomit blood or material that looks like coffee grounds. °· You have abdominal pain or tenderness. °· You have a fever. °· You feel weak, nauseous, or you faint. °· You have  severe rectal pain or you are unable to have a bowel movement. °MAKE SURE YOU: °· Understand these instructions. °· Will watch your condition. °· Will get help right away if you are not doing well or get worse. °Document Released: 11/22/2001 Document Revised: 08/25/2011 Document Reviewed: 11/17/2010 °ExitCare® Patient Information ©2015 [employer]. This information is not intended to replace advice given to you by your health care provider. Make sure you discuss any questions you have with your health care provider. ° °

## 2015-02-12 NOTE — Telephone Encounter (Signed)
Rx faxed.    KP 

## 2015-02-12 NOTE — Progress Notes (Signed)
    Progress Note   Subjective  no BMs or bleeding   Objective   Vital signs in last 24 hours: Temp:  [97.7 F (36.5 C)-98.5 F (36.9 C)] 97.7 F (36.5 C) (08/29 0400) Pulse Rate:  [50-104] 78 (08/29 0400) Resp:  [11-33] 11 (08/29 0400) BP: (136-176)/(82-115) 144/88 mmHg (08/29 0400) SpO2:  [90 %-100 %] 98 % (08/29 0400) Last BM Date: 02/11/15 (enemas for sigmoidoscopy) General:    white male in NAD Abdomen:  Soft, nontender and nondistended. Normal bowel sounds. Neurologic:  Alert and oriented,  grossly normal neurologically. Psych:  Cooperative. Normal mood and affect.  Lab Results:  Recent Labs  02/11/15 0315 02/11/15 1015 02/12/15 0408  WBC 8.7 6.9 8.2  HGB 9.7* 10.5* 9.4*  HCT 28.5* 31.0* 29.1*  PLT 289 326 283   BMET  Recent Labs  02/10/15 1550 02/11/15 0315 02/12/15 0408  NA 139 140 141  K 3.5 3.2* 3.5  CL 104 109 109  CO2 25 26 23   GLUCOSE 130* 135* 134*  BUN 19 16 11   CREATININE 1.37* 1.16 1.02  CALCIUM 8.8* 7.6* 8.1*   LFT  Recent Labs  02/11/15 0315  PROT 5.5*  ALBUMIN 3.2*  AST 17  ALT 15*  ALKPHOS 48  BILITOT 0.7     Assessment / Plan:   43. 54 year old male with rectal bleeding secondary to rectal ulcer at site of previous hemorrhoid banding. HE is s/p flex with placement of 2 clips at site yesterday. No further bleeding. Followed at [place]. Patient stable for discharge from GI standpoint.  2. Anemia of acute blood loss, No transfusion required but hgb fell from 14.6 to 9.4.     LOS: 2 days   Tye Savoy  02/12/2015, 8:50 AM   ________________________________________________________________________  [facility] MD note:  I personally examined the patient, reviewed the data and agree with the assessment and plan described above.   Owens Loffler, MD [facility] Pager [phone]

## 2015-02-12 NOTE — Discharge Summary (Signed)
Physician Discharge Summary  Chase [name]:[id number] DOB: Feb 01, 1961 DOA: 02/10/2015  PCP: [name], DO  Admit date: 02/10/2015 Discharge date: 02/12/2015  Time spent: Less than 30 minutes  Recommendations for Outpatient Follow-up:  1. Dr. [name], PCP in 3 days with repeat labs (CBC, BMP & EKG). 2. [facility] in 1 week 3. Recommend outpatient urology consultation through PCP (patient informed).  Discharge Diagnoses:  Principal Problem:   Rectal ulcer with bleeding after hemorrhoid banding Active Problems:   OSA on CPAP   Persistent atrial fibrillation   Essential hypertension   Type 2 diabetes mellitus with other circulatory complications   Blood in stool, frank   Anemia   Acute renal insufficiency   Hemorrhoids   GI bleed   Acute GI bleeding   Lower GI bleed   Discharge Condition: Improved & Stable  Diet recommendation: Heart healthy and diabetic diet.  Filed Weights   02/10/15 2000  Weight: 87.4 kg (192 lb 10.9 oz)    History of present illness:  54 year old male patient with history of OSA on nightly CPAP, CVA's with residual speech deficits/? Expressive aphasia, GERD, HTN, HLD, hemorrhoids, DM 2, presented to [facility] ED on 02/10/15 with history of bright red rectal bleeding since that morning (several bloody BMs), abdominal cramping, severe dysuria for past few days and dizziness and upright position. In ED, noted to have nonbleeding external hemorrhoids, hemoglobin down to 12 from 14, creatinine 1.37. Admitted to stepdown unit for further evaluation and management. He complained of transient chest pain.  Hospital Course:   Acute lower GI bleed/BRBPR - [facility] was consulted and patient underwent flexible sigmoidoscopy on 8/28 with results as below. Rectal bleeding secondary to rectal ulcer at site of previous hemorrhoidal banding. 2 clips were placed at site yesterday and there has been no further bleeding. GI has evaluated him  today and have cleared him for discharge. He is advised to follow-up with his primary [facility]enterologist [facility]. He verbalizes understanding.  Acute posthemorrhagic anemia - Secondary to rectal bleeding - Hemoglobin stable in the 9 g range. Follow CBCs as outpatient in a couple of days.  Urinary retention/possible prostatomegaly - Probably from prostatomegaly - No pain and out cath was done since admission. Patient has chronic symptoms of incomplete voiding. Post void residuals were 288 mL this morning. Discussed with urologist on call who recommended starting Flomax 0.4 MG daily, avoid Foley catheter at this time and outpatient follow-up with PCP and may consider urology consultation as outpatient. - Urine culture, negative.  Hypokalemia - Potassium 3.5. We will give additional K Dur 40 meq prior to discharge.  Uncontrolled Hypertension - Continue home dose of metoprolol 25 twice a day. Will start amlodipine 5 MG daily. Outpatient follow-up.   Acute kidney injury - Resolved  Type II DM - Diet-controlled at home.   CVAs with residual speech difficulties - Aspirin held secondary to rectal bleeding. Will be resumed at discharge.   Atypical Chest pain  - Resolved  - Troponin 3 negative  - EKG without acute findingsNo further report of chest pain.   Prolonged QTC - 530 milliseconds on admission. - magnesium 1.9. Potassium is 3.5. Will give additional 40 mEq of potassium prior to discharge. QTC is improved from 530 to 485 msecs. No arrhythmias on monitor. EKG: Sinus rhythm, normal axis, occasional PVCs and no acute changes. - Follow EKG as outpatient. Minimize medications that are likely to prolonged QTC.   Anxiety & depression/Chronic benzodiazepine use  -  Continue home medications-verified by in-house pharmacist.   Consultations:  [facility]   Procedures:  Flexible sigmoidoscopy 02/11/2015:  COLON FINDINGS: 1) Distal rectal ulcer from banding  with non-bleeding visible vessel. 2 clips applied to stop further bleeding. 2) otherwise normal - no blood above rectum. Retroflexion was not performed due to a narrow rectal vault. The scope was then withdrawn from the patient and the procedure terminated.  COMPLICATIONS:There were no immediate complications.  ENDOSCOPIC IMPRESSION: 1) Distal rectal ulcer from banding with non-bleeding visible vessel. 2 clips applied to stop further bleeding. 2) otherwise normal - no blood above rectum  RECOMMENDATIONS: 1) Carb mod diet 2) to Med-surg bed should be ok today 3) Home tomorrow if ok - needs f/u with [facility] MD that did the banding   Discharge Exam:  Complaints:  No further rectal bleeding. Intermittent difficulty voiding and dysuria. This seems to be a chronic symptom. No chest pain, dizziness or lightheadedness. As per nursing, ambulated in the halls without discomfort.   Filed Vitals:   02/12/15 1300 02/12/15 1400 02/12/15 1425 02/12/15 1500  BP:   162/102   Pulse: 87 80 87   Temp:      TempSrc:      Resp: 21 18 18 25   Height:      Weight:      SpO2: 99% 97% 100%     General exam: Pleasant middle-aged male lying comfortably in bed  Respiratory system: Clear. No increased work of breathing. Cardiovascular system: S1 & S2 heard, RRR. No JVD, murmurs, gallops, clicks or pedal edema. telemetry: Sinus rhythm Gastrointestinal system: Abdomen is nondistended, soft and nontender.No palpable bladder. Normal bowel sounds heard. Central nervous system: Alert and oriented. Chronic expressive aphasia. No focal neurological deficits. Extremities: Symmetric 5 x 5 power.  Discharge Instructions      Discharge Instructions    Call MD for:  difficulty breathing, headache or visual disturbances    Complete by:  As directed      Call MD for:  extreme fatigue    Complete by:  As directed      Call MD for:  hives    Complete by:  As directed      Call MD  for:  persistant dizziness or light-headedness    Complete by:  As directed      Call MD for:  persistant nausea and vomiting    Complete by:  As directed      Call MD for:  severe uncontrolled pain    Complete by:  As directed      Call MD for:  temperature >100.4    Complete by:  As directed      Call MD for:    Complete by:  As directed   Rectal bleeding.     Diet - low sodium heart healthy    Complete by:  As directed      Diet Carb Modified    Complete by:  As directed      Increase activity slowly    Complete by:  As directed             Medication List    STOP taking these medications        ibuprofen 100 MG/5ML suspension  Commonly known as:  ADVIL,MOTRIN     triamcinolone 55 MCG/ACT Aero nasal inhaler  Commonly known as:  NASACORT      TAKE these medications        allopurinol 100 MG tablet  Commonly known as:  ZYLOPRIM  Take 1 tablet (100 mg total) by mouth daily.     amLODipine 5 MG tablet  Commonly known as:  NORVASC  Take 1 tablet (5 mg total) by mouth daily.     aspirin 81 MG chewable tablet  Chew 1 tablet (81 mg total) by mouth daily.     Azelastine HCl 0.15 % Soln  PLACE 2 SPRAYS IN EACH NOSTRIL every night     cetirizine 10 MG tablet  Commonly known as:  ZYRTEC  Take 1 tablet (10 mg total) by mouth daily.     cyclobenzaprine 10 MG tablet  Commonly known as:  FLEXERIL  Take 1 tablet (10 mg total) by mouth at bedtime.     diazepam 5 MG tablet  Commonly known as:  VALIUM  Take 1 tablet (5 mg total) by mouth 3 (three) times daily.     FIBER SELECT GUMMIES PO  Take 1 tablet by mouth daily.     freestyle lancets  Use as instructed test 4 (four) times daily. Dx. 938.10     ONETOUCH DELICA LANCETS FINE Misc  Check blood sugar four times per day. Dx:E11.9     glucose blood test strip  Commonly known as:  ONETOUCH VERIO  Check blood sugar four times a day. Dx. E11.9 Onetouch Verio test strips     HYDROcodone-acetaminophen 7.5-325 mg/15  ml solution  Commonly known as:  HYCET  TAKE 15 ML EVERY 6 HOURS AS NEEDED FOR PAIN     hydrocortisone 25 MG suppository  Commonly known as:  ANUSOL-HC  PLACE 1 SUPPOSITORY INTO RECTUM TWICE A DAY     hydrocortisone-pramoxine 2.5-1 % rectal cream  Commonly known as:  ANALPRAM-HC  Place rectally 3 (three) times daily as needed for hemorrhoids or itching.     hydrOXYzine 25 MG tablet  Commonly known as:  ATARAX/VISTARIL  TAKE 1 TABLET (25 MG TOTAL) BY MOUTH EVERY 8 (EIGHT) HOURS AS NEEDED FOR ANXIETY OR ITCHING.     meclizine 25 MG tablet  Commonly known as:  ANTIVERT  Take 1 tablet (25 mg total) by mouth 3 (three) times daily as needed.     metoprolol tartrate 25 MG tablet  Commonly known as:  LOPRESSOR  TAKE 1 TABLET TWICE A DAY, MAY CRUSH IN FOOD     mometasone 0.1 % ointment  Commonly known as:  ELOCON  Apply 1 application topically daily as needed (for rash).     pravastatin 20 MG tablet  Commonly known as:  PRAVACHOL  Take 1 tablet (20 mg total) by mouth daily.     ranitidine 75 MG/5ML syrup  Commonly known as:  ZANTAC  TAKE 10 MLS BY MOUTH 2 TIMES DAILY AS NEEDED. FOR INDIGESTION.     tamsulosin 0.4 MG Caps capsule  Commonly known as:  FLOMAX  Take 1 capsule (0.4 mg total) by mouth daily.     venlafaxine XR 37.5 MG 24 hr capsule  Commonly known as:  EFFEXOR-XR  Take 1 capsule (37.5 mg total) by mouth daily.       Follow-up Information    Follow up with [facility]). Schedule an appointment as soon as possible for a visit in 1 week.      Follow up with [name], DO. Schedule an appointment as soon as possible for a visit in 3 days.   Specialty:  Family Medicine   Why:  To be seen with repeat labs (CBC & BMP).   Contact  information:   [place] [address] [phone]        The results of significant diagnostics from this hospitalization (including imaging, microbiology, ancillary and laboratory) are listed  below for reference.    Significant Diagnostic Studies: No results found.  Microbiology: Recent Results (from the past 240 hour(s))  Urine culture     Status: None   Collection Time: 02/10/15  8:05 PM  Result Value Ref Range Status   Specimen Description URINE, CLEAN CATCH  Final   Special Requests NONE  Final   Culture   Final    NO GROWTH 1 DAY Performed at [facility]    Report Status 02/12/2015 FINAL  Final  MRSA PCR Screening     Status: None   Collection Time: 02/10/15  8:09 PM  Result Value Ref Range Status   MRSA by PCR NEGATIVE NEGATIVE Final    Comment:        The GeneXpert MRSA Assay (FDA approved for NASAL specimens only), is one component of a comprehensive MRSA colonization surveillance program. It is not intended to diagnose MRSA infection nor to guide or monitor treatment for MRSA infections.      Labs: Basic Metabolic Panel:  Recent Labs Lab 02/10/15 1550 02/11/15 0315 02/12/15 0408  NA 139 140 141  K 3.5 3.2* 3.5  CL 104 109 109  CO2 25 26 23   GLUCOSE 130* 135* 134*  BUN 19 16 11   CREATININE 1.37* 1.16 1.02  CALCIUM 8.8* 7.6* 8.1*  MG  --  1.9  --   PHOS  --  3.5  --    Liver Function Tests:  Recent Labs Lab 02/10/15 1550 02/11/15 0315  AST 26 17  ALT 20 15*  ALKPHOS 57 48  BILITOT 0.8 0.7  PROT 6.7 5.5*  ALBUMIN 4.0 3.2*   No results for input(s): LIPASE, AMYLASE in the last 168 hours. No results for input(s): AMMONIA in the last 168 hours. CBC:  Recent Labs Lab 02/10/15 1550 02/10/15 2035 02/11/15 0315 02/11/15 1015 02/12/15 0408  WBC 11.8* 10.0 8.7 6.9 8.2  HGB 12.0* 10.6* 9.7* 10.5* 9.4*  HCT 35.6* 31.5* 28.5* 31.0* 29.1*  MCV 87.0 87.7 88.0 88.1 88.7  PLT 353 320 289 326 283   Cardiac Enzymes:  Recent Labs Lab 02/10/15 2035 02/11/15 0315 02/11/15 0719  TROPONINI <0.03 <0.03 <0.03   BNP: BNP (last 3 results) No results for input(s): BNP in the last 8760 hours.  ProBNP (last 3 results) No  results for input(s): PROBNP in the last 8760 hours.  CBG:  Recent Labs Lab 02/11/15 1226 02/11/15 1603 02/11/15 2155 02/12/15 0817 02/12/15 1153  GLUCAP 123* 141* 140* 115* 145*        Signed:  Vernell Leep, MD, FACP, FHM. [facility] Pager [phone]  If 7PM-7AM, please contact night-coverage [email or web address] Password [email or web address] 02/12/2015, 3:10 PM

## 2015-02-12 NOTE — Addendum Note (Signed)
Addended by: Ewing Schlein on: 02/12/2015 08:19 AM   Modules accepted: Orders

## 2015-02-13 ENCOUNTER — Ambulatory Visit (INDEPENDENT_AMBULATORY_CARE_PROVIDER_SITE_OTHER): Payer: BLUE CROSS/BLUE SHIELD | Admitting: Family Medicine

## 2015-02-13 ENCOUNTER — Telehealth: Payer: Self-pay | Admitting: Family Medicine

## 2015-02-13 ENCOUNTER — Encounter (HOSPITAL_COMMUNITY): Payer: Self-pay | Admitting: Internal Medicine

## 2015-02-13 VITALS — BP 142/94 | HR 102 | Temp 98.4°F | Wt 196.0 lb

## 2015-02-13 DIAGNOSIS — M25511 Pain in right shoulder: Secondary | ICD-10-CM | POA: Diagnosis not present

## 2015-02-13 MED ORDER — HYDROCODONE-ACETAMINOPHEN 7.5-325 MG/15ML PO SOLN: ORAL | Status: DC

## 2015-02-13 MED ORDER — CYCLOBENZAPRINE HCL 10 MG PO TABS: 10.0000 mg | ORAL_TABLET | Freq: Every day | ORAL | Status: DC

## 2015-02-13 NOTE — Telephone Encounter (Signed)
Patient needs a TCM call.     KP

## 2015-02-13 NOTE — Telephone Encounter (Signed)
Relation to pt: self  Call back number:  [phone]   Reason for call:  Patient was discharged from hospital 02/13/15 and would like to follow up with MD as soon as possible. Dr. Etter Sjogren next available is not until next week and patient would like to see an MD only. Please advise

## 2015-02-13 NOTE — Progress Notes (Signed)
Pre visit review using our clinic review tool, if applicable. No additional management support is needed unless otherwise documented below in the visit note. 

## 2015-02-13 NOTE — Patient Instructions (Addendum)

## 2015-02-14 ENCOUNTER — Ambulatory Visit (INDEPENDENT_AMBULATORY_CARE_PROVIDER_SITE_OTHER): Payer: BLUE CROSS/BLUE SHIELD | Admitting: Family Medicine

## 2015-02-14 ENCOUNTER — Encounter: Payer: Self-pay | Admitting: Family Medicine

## 2015-02-14 ENCOUNTER — Other Ambulatory Visit: Payer: Medicaid Other

## 2015-02-14 ENCOUNTER — Ambulatory Visit (HOSPITAL_BASED_OUTPATIENT_CLINIC_OR_DEPARTMENT_OTHER)
Admission: RE | Admit: 2015-02-14 | Discharge: 2015-02-14 | Disposition: A | Discharge status: Home / Self Care | Payer: BLUE CROSS/BLUE SHIELD | Source: Ambulatory Visit | Attending: Family Medicine | Admitting: Family Medicine

## 2015-02-14 VITALS — BP 121/73 | HR 97 | Temp 98.0°F | Ht 70.0 in | Wt 195.0 lb

## 2015-02-14 DIAGNOSIS — M79601 Pain in right arm: Secondary | ICD-10-CM

## 2015-02-14 DIAGNOSIS — M25511 Pain in right shoulder: Secondary | ICD-10-CM | POA: Diagnosis not present

## 2015-02-14 DIAGNOSIS — M542 Cervicalgia: Secondary | ICD-10-CM | POA: Insufficient documentation

## 2015-02-14 MED ORDER — HYDROCODONE-ACETAMINOPHEN 7.5-325 MG/15ML PO SOLN: ORAL | Status: DC

## 2015-02-14 NOTE — Telephone Encounter (Signed)
Pt came in office on 02/13/15 for an acute visit.  Pt still needs hospital follow up.

## 2015-02-14 NOTE — Patient Instructions (Signed)
You have a frozen shoulder (adhesive capsulitis), a buildup of scar tissue that limits motion of the shoulder joint. Limit lifting and overhead activities as much as possible. Heat 15 minutes at a time 3-4 times a day may help with movement and stiffness. Hydrocodone as directed - no driving on this. Steroid injections in a series have been shown to help with pain and motion. Codman exercises (pendulum, wall walking or table slides, arm circles) - do 3 sets of 10 once or twice a day. Physical therapy for rotator cuff strengthening is a consideration once you are out of the painful phase Follow up in 4 weeks

## 2015-02-14 NOTE — Telephone Encounter (Signed)
Unable to reach patient at time of TCM Call. Left message for patient to return call when available.  

## 2015-02-15 DIAGNOSIS — M25511 Pain in right shoulder: Secondary | ICD-10-CM | POA: Insufficient documentation

## 2015-02-15 DIAGNOSIS — M79601 Pain in right arm: Secondary | ICD-10-CM | POA: Diagnosis not present

## 2015-02-15 HISTORY — DX: Pain in right shoulder: M25.511

## 2015-02-15 MED ORDER — METHYLPREDNISOLONE ACETATE 40 MG/ML IJ SUSP
40.0000 mg | Freq: Once | INTRAMUSCULAR | Status: AC
  Administered 2015-02-15: 40 mg via INTRA_ARTICULAR

## 2015-02-15 NOTE — Progress Notes (Signed)
PCP and referred by: [name], DO  Subjective:   HPI: Patient is a 54 y.o. male here for right shoulder pain.  Patient denies known injury. He was recently hospitalized for a lower GI bleed. Reports he had pain in right shoulder off and on day after he woke up when in the hospital. Pain has intensified and become 10/10 level. Unable to move right arm without pain. Is right handed. Taking norco and flexeril. No prior issues with this shoulder.  Past Medical History  Diagnosis Date  . Allergy   . Stroke   . Sleep apnea   . Headache(784.0)   . Arthritis   . GERD (gastroesophageal reflux disease)   . Hypertension   . Hyperlipidemia   . Hearing loss   . Nasal congestion   . Trouble swallowing   . Blood in stool   . Chest pain   . Leg swelling   . Constipation   . Rectal pain   . Difficulty urinating   . Thrombosed hemorrhoids   . Brain cyst   . PONV (postoperative nausea and vomiting)   . Diabetes mellitus without complication   . Stroke   . Rectal ulcer with bleeding after hemorrhoid banding 02/11/2015    Current Outpatient Prescriptions on File Prior to Visit  Medication Sig Dispense Refill  . allopurinol (ZYLOPRIM) 100 MG tablet Take 1 tablet (100 mg total) by mouth daily. 90 tablet 1  . amLODipine (NORVASC) 5 MG tablet Take 1 tablet (5 mg total) by mouth daily. 30 tablet 0  . aspirin 81 MG chewable tablet Chew 1 tablet (81 mg total) by mouth daily. 90 tablet 3  . Azelastine HCl 0.15 % SOLN PLACE 2 SPRAYS IN EACH NOSTRIL every night    . cetirizine (ZYRTEC) 10 MG tablet Take 1 tablet (10 mg total) by mouth daily. 30 tablet 11  . cyclobenzaprine (FLEXERIL) 10 MG tablet Take 1 tablet (10 mg total) by mouth at bedtime. 30 tablet 3  . diazepam (VALIUM) 5 MG tablet Take 1 tablet (5 mg total) by mouth 3 (three) times daily. 90 tablet 0  . FIBER SELECT GUMMIES PO Take 1 tablet by mouth daily.    Marland Kitchen glucose blood (ONETOUCH VERIO) test strip Check blood sugar four times a  day. Dx. E11.9 Onetouch Verio test strips 100 each 12  . hydrocortisone (ANUSOL-HC) 25 MG suppository PLACE 1 SUPPOSITORY INTO RECTUM TWICE A DAY 12 suppository 2  . hydrocortisone-pramoxine (ANALPRAM-HC) 2.5-1 % rectal cream Place rectally 3 (three) times daily as needed for hemorrhoids or itching. 30 g 0  . hydrOXYzine (ATARAX/VISTARIL) 25 MG tablet TAKE 1 TABLET (25 MG TOTAL) BY MOUTH EVERY 8 (EIGHT) HOURS AS NEEDED FOR ANXIETY OR ITCHING.    . meclizine (ANTIVERT) 25 MG tablet Take 1 tablet (25 mg total) by mouth 3 (three) times daily as needed. (Patient taking differently: Take 25 mg by mouth 3 (three) times daily as needed for dizziness. ) 60 tablet 0  . metoprolol tartrate (LOPRESSOR) 25 MG tablet TAKE 1 TABLET TWICE A DAY, MAY CRUSH IN FOOD 180 tablet 1  . mometasone (ELOCON) 0.1 % ointment Apply 1 application topically daily as needed (for rash).    [name] DELICA LANCETS FINE MISC Check blood sugar four times per day. Dx:E11.9 100 each 12  . pravastatin (PRAVACHOL) 20 MG tablet Take 1 tablet (20 mg total) by mouth daily. 90 tablet 3  . ranitidine (ZANTAC) 75 MG/5ML syrup TAKE 10 MLS BY MOUTH 2 TIMES DAILY  AS NEEDED. FOR INDIGESTION.    . tamsulosin (FLOMAX) 0.4 MG CAPS capsule Take 1 capsule (0.4 mg total) by mouth daily. 30 capsule 0  . venlafaxine XR (EFFEXOR-XR) 37.5 MG 24 hr capsule Take 1 capsule (37.5 mg total) by mouth daily. 30 capsule 5   No current facility-administered medications on file prior to visit.    Past Surgical History  Procedure Laterality Date  . Cataract extraction      x 3  . Inner ear surgery      rt ear  . Eye surgery  1968, 1985, 1987    cataracts  . Inner ear surgery  1992  . Surgery - right arm      1983  . Flexible sigmoidoscopy N/A 02/11/2015    Procedure: FLEXIBLE SIGMOIDOSCOPY;  Surgeon: Gatha Mayer, MD;  Location: [facility];  Service: Endoscopy;  Laterality: N/A;    Allergies  Allergen Reactions  . Amoxicillin Hives, Itching and  Other (See Comments)    White tongue; ? hives  . Flonase [Fluticasone Propionate]     Nasal Sores  . Terfenadine     ? reaction    Social History   Social History  . Marital Status: Single    Spouse Name: N/A  . Number of Children: 0  . Years of Education: N/A   Occupational History  . Not on file.   Social History Main Topics  . Smoking status: Never Smoker   . Smokeless tobacco: Never Used  . Alcohol Use: No     Comment: none  . Drug Use: No  . Sexual Activity: No   Other Topics Concern  . Not on file   Social History Narrative   No caffeine intake except for chocolate.    Family History  Problem Relation Age of Onset  . Breast cancer Mother 105    breast  . Stroke Father   . Heart disease Father   . Hypertension Father   . Heart attack Father   . Hypertension Brother   . Hyperlipidemia Neg Hx   . Diabetes Neg Hx     BP 121/73 mmHg  Pulse 97  Temp(Src) 98 F (36.7 C) (Oral)  Ht 5\' 10"  (1.778 m)  Wt 195 lb (88.451 kg)  BMI 27.98 kg/m2  Review of Systems: See HPI above.    Objective:  Physical Exam:  Gen: NAD, afebrile  Right shoulder: Multiple bruises, evidence of needle sticks.  No obvious swelling, warmth, erythema. TTP lateral shoulder, throughout upper arm. Full IR.  ER to 10 degrees.  Abduction and flexion to 50 degrees, all motions very painful. Painful neers.  Negative Yergasons. Unable to position for empty can.  5/5 strength with IR and ER. Negative apprehension. NV intact distally.     Assessment & Plan:  1. Right shoulder pain - patient afebrile - no evidence of cellulitis.  Concern for DVT with multiple needle sticks and fairly diffuse pain right arm but doppler u/s negative.  Consistent with severe adhesive capsulitis.  Discussed options - went ahead with combination subacromial/intraarticular injection today.  Shown home exercises to do daily.  Heat for spasms, stiffness.  Hydrocodone as needed.  F/u in 4 weeks.  After  informed written consent, patient was seated on exam table. Right shoulder was prepped with alcohol swab and utilizing posterior approach, patient's right shoulder was injected with 6:2 marcaine:depomedrol with half in the subacromial space and half in glenohumeral space.  Patient tolerated the procedure well without immediate complications.

## 2015-02-15 NOTE — Assessment & Plan Note (Signed)
patient afebrile - no evidence of cellulitis.  Concern for DVT with multiple needle sticks and fairly diffuse pain right arm but doppler u/s negative.  Consistent with severe adhesive capsulitis.  Discussed options - went ahead with combination subacromial/intraarticular injection today.  Shown home exercises to do daily.  Heat for spasms, stiffness.  Hydrocodone as needed.  F/u in 4 weeks.  After informed written consent, patient was seated on exam table. Right shoulder was prepped with alcohol swab and utilizing posterior approach, patient's right shoulder was injected with 6:2 marcaine:depomedrol with half in the subacromial space and half in glenohumeral space.  Patient tolerated the procedure well without immediate complications.

## 2015-02-17 ENCOUNTER — Other Ambulatory Visit: Payer: Self-pay | Admitting: Family Medicine

## 2015-02-17 NOTE — Progress Notes (Signed)
Patient ID: Chase [name], male    DOB: [birth year]  Age: 54 y.o. MRN: [id number]    Subjective:  Subjective HPI Chase [name] presents for c/o R shoulder pain  Review of Systems  Constitutional: Negative.  Negative for diaphoresis, appetite change, fatigue and unexpected weight change.  HENT: Negative for congestion, ear pain, hearing loss, nosebleeds, postnasal drip, rhinorrhea, sinus pressure, sneezing and tinnitus.   Eyes: Negative for photophobia, pain, discharge, redness, itching and visual disturbance.  Respiratory: Negative.  Negative for cough, chest tightness, shortness of breath and wheezing.   Cardiovascular: Negative.  Negative for chest pain, palpitations and leg swelling.  Gastrointestinal: Negative for abdominal pain, constipation, blood in stool, abdominal distention and anal bleeding.  Endocrine: Negative.  Negative for cold intolerance, heat intolerance, polydipsia, polyphagia and polyuria.  Genitourinary: Negative.  Negative for dysuria, frequency and difficulty urinating.  Musculoskeletal: Positive for myalgias and neck pain. Negative for joint swelling.  Skin: Negative.   Allergic/Immunologic: Negative.   Neurological: Negative for dizziness, weakness, light-headedness, numbness and headaches.  Psychiatric/Behavioral: Negative for suicidal ideas, confusion, sleep disturbance, dysphoric mood, decreased concentration and agitation. The patient is not nervous/anxious.     History Past Medical History  Diagnosis Date  . Allergy   . Stroke   . Sleep apnea   . Headache(784.0)   . Arthritis   . GERD (gastroesophageal reflux disease)   . Hypertension   . Hyperlipidemia   . Hearing loss   . Nasal congestion   . Trouble swallowing   . Blood in stool   . Chest pain   . Leg swelling   . Constipation   . Rectal pain   . Difficulty urinating   . Thrombosed hemorrhoids   . Brain cyst   . PONV (postoperative nausea and vomiting)   . Diabetes mellitus without  complication   . Stroke   . Rectal ulcer with bleeding after hemorrhoid banding 02/11/2015    He has past surgical history that includes Cataract extraction; Inner ear surgery; Eye surgery ([place]); Inner ear surgery (1992); surgery - right arm; and Flexible sigmoidoscopy (N/A, 02/11/2015).   His family history includes Breast cancer (age of onset: 68) in his mother; Heart attack in his father; Heart disease in his father; Hypertension in his brother and father; Stroke in his father. There is no history of Hyperlipidemia or Diabetes.He reports that he has never smoked. He has never used smokeless tobacco. He reports that he does not drink alcohol or use illicit drugs.  Current Outpatient Prescriptions on File Prior to Visit  Medication Sig Dispense Refill  . allopurinol (ZYLOPRIM) 100 MG tablet Take 1 tablet (100 mg total) by mouth daily. 90 tablet 1  . amLODipine (NORVASC) 5 MG tablet Take 1 tablet (5 mg total) by mouth daily. 30 tablet 0  . aspirin 81 MG chewable tablet Chew 1 tablet (81 mg total) by mouth daily. 90 tablet 3  . Azelastine HCl 0.15 % SOLN PLACE 2 SPRAYS IN EACH NOSTRIL every night    . cetirizine (ZYRTEC) 10 MG tablet Take 1 tablet (10 mg total) by mouth daily. 30 tablet 11  . diazepam (VALIUM) 5 MG tablet Take 1 tablet (5 mg total) by mouth 3 (three) times daily. 90 tablet 0  . glucose blood (ONETOUCH VERIO) test strip Check blood sugar four times a day. Dx. E11.9 Onetouch Verio test strips 100 each 12  . hydrocortisone (ANUSOL-HC) 25 MG suppository PLACE 1 SUPPOSITORY INTO RECTUM TWICE A DAY 12  suppository 2  . hydrocortisone-pramoxine (ANALPRAM-HC) 2.5-1 % rectal cream Place rectally 3 (three) times daily as needed for hemorrhoids or itching. 30 g 0  . hydrOXYzine (ATARAX/VISTARIL) 25 MG tablet TAKE 1 TABLET (25 MG TOTAL) BY MOUTH EVERY 8 (EIGHT) HOURS AS NEEDED FOR ANXIETY OR ITCHING.    . meclizine (ANTIVERT) 25 MG tablet Take 1 tablet (25 mg total) by mouth 3  (three) times daily as needed. (Patient taking differently: Take 25 mg by mouth 3 (three) times daily as needed for dizziness. ) 60 tablet 0  . metoprolol tartrate (LOPRESSOR) 25 MG tablet TAKE 1 TABLET TWICE A DAY, MAY CRUSH IN FOOD 180 tablet 1  . mometasone (ELOCON) 0.1 % ointment Apply 1 application topically daily as needed (for rash).    [name] DELICA LANCETS FINE MISC Check blood sugar four times per day. Dx:E11.9 100 each 12  . pravastatin (PRAVACHOL) 20 MG tablet Take 1 tablet (20 mg total) by mouth daily. 90 tablet 3  . ranitidine (ZANTAC) 75 MG/5ML syrup TAKE 10 MLS BY MOUTH 2 TIMES DAILY AS NEEDED. FOR INDIGESTION.    . tamsulosin (FLOMAX) 0.4 MG CAPS capsule Take 1 capsule (0.4 mg total) by mouth daily. 30 capsule 0  . venlafaxine XR (EFFEXOR-XR) 37.5 MG 24 hr capsule Take 1 capsule (37.5 mg total) by mouth daily. 30 capsule 5  . FIBER SELECT GUMMIES PO Take 1 tablet by mouth daily.     No current facility-administered medications on file prior to visit.     Objective:  Objective Physical Exam  Constitutional: He is oriented to person, place, and time. Vital signs are normal. He appears well-developed and well-nourished. He is sleeping.  HENT:  Head: Normocephalic and atraumatic.  Mouth/Throat: Oropharynx is clear and moist.  Eyes: EOM are normal. Pupils are equal, round, and reactive to light.  Neck: Normal range of motion. Neck supple. No thyromegaly present.  Cardiovascular: Normal rate and regular rhythm.   No murmur heard. Pulmonary/Chest: Effort normal and breath sounds normal. No respiratory distress. He has no wheezes. He has no rales. He exhibits no tenderness.  Musculoskeletal: He exhibits tenderness. He exhibits no edema.       Right shoulder: He exhibits decreased range of motion and tenderness. He exhibits no swelling, no effusion and no crepitus.  Neurological: He is alert and oriented to person, place, and time.  Skin: Skin is warm and dry.  Psychiatric:  He has a normal mood and affect. His behavior is normal. Judgment and thought content normal.   BP 142/94 mmHg  Pulse 102  Temp(Src) 98.4 F (36.9 C) (Oral)  Wt 196 lb (88.905 kg)  SpO2 98% Wt Readings from Last 3 Encounters:  02/14/15 195 lb (88.451 kg)  02/13/15 196 lb (88.905 kg)  02/10/15 192 lb 10.9 oz (87.4 kg)     Lab Results  Component Value Date   WBC 8.2 02/12/2015   HGB 9.4* 02/12/2015   HCT 29.1* 02/12/2015   PLT 283 02/12/2015   GLUCOSE 134* 02/12/2015   CHOL 180 10/03/2014   TRIG 128.0 10/03/2014   HDL 32.90* 10/03/2014   LDLDIRECT 148.2 06/03/2013   LDLCALC 122* 10/03/2014   ALT 15* 02/11/2015   AST 17 02/11/2015   NA 141 02/12/2015   K 3.5 02/12/2015   CL 109 02/12/2015   CREATININE 1.02 02/12/2015   BUN 11 02/12/2015   CO2 23 02/12/2015   TSH 2.314 02/11/2015   PSA 1.01 10/03/2014   INR 1.11 02/10/2015  HGBA1C 6.8* 02/11/2015   MICROALBUR 3.5* 10/03/2014    US Venous Img Upper Uni Right  02/14/2015   CLINICAL DATA:  Right arm and neck pain, recent hospitalization  EXAM: Right UPPER EXTREMITY VENOUS DOPPLER ULTRASOUND  TECHNIQUE: Gray-scale sonography with graded compression, as well as color Doppler and duplex ultrasound were performed to evaluate the upper extremity deep venous system from the level of the subclavian vein and including the jugular, axillary, basilic, radial, ulnar and upper cephalic vein. Spectral Doppler was utilized to evaluate flow at rest and with distal augmentation maneuvers.  COMPARISON:  None.  FINDINGS: Contralateral Subclavian Vein: Respiratory phasicity is normal and symmetric with the symptomatic side. No evidence of thrombus. Normal compressibility.  Internal Jugular Vein: No evidence of thrombus. Normal compressibility, respiratory phasicity and response to augmentation.  Subclavian Vein: No evidence of thrombus. Normal compressibility, respiratory phasicity and response to augmentation.  Axillary Vein: No evidence of  thrombus. Normal compressibility, respiratory phasicity and response to augmentation.  Cephalic Vein: No evidence of thrombus. Normal compressibility, respiratory phasicity and response to augmentation.  Basilic Vein: No evidence of thrombus. Normal compressibility, respiratory phasicity and response to augmentation.  Brachial Veins: No evidence of thrombus. Normal compressibility, respiratory phasicity and response to augmentation.  Radial Veins: No evidence of thrombus. Normal compressibility, respiratory phasicity and response to augmentation.  Ulnar Veins: No evidence of thrombus. Normal compressibility, respiratory phasicity and response to augmentation.  Venous Reflux:  None visualized.  Other Findings:  None visualized.  IMPRESSION: No evidence of deep venous thrombosis.   Electronically Signed   By: Conchita Paris M.D.   On: 02/14/2015 16:30     Assessment & Plan:  Plan I have discontinued Mr. Arnett freestyle and HYDROcodone-acetaminophen. I am also having him maintain his [place] PO, metoprolol tartrate, cetirizine, venlafaxine XR, meclizine, glucose blood, ONETOUCH DELICA LANCETS FINE, allopurinol, hydrocortisone, ranitidine, hydrOXYzine, aspirin, pravastatin, diazepam, hydrocortisone-pramoxine, amLODipine, Azelastine HCl, mometasone, tamsulosin, and cyclobenzaprine.  Meds ordered this encounter  Medications  . DISCONTD: HYDROcodone-acetaminophen (HYCET) 7.5-325 mg/15 ml solution    Sig: TAKE 15 ML EVERY 6 HOURS AS NEEDED FOR PAIN    Dispense:  120 mL    Refill:  0  . cyclobenzaprine (FLEXERIL) 10 MG tablet    Sig: Take 1 tablet (10 mg total) by mouth at bedtime.    Dispense:  30 tablet    Refill:  3    Problem List Items Addressed This Visit    None    Visit Diagnoses    Pain in joint, shoulder region, right    -  Primary    Relevant Medications    cyclobenzaprine (FLEXERIL) 10 MG tablet    Other Relevant Orders    Ambulatory referral to Sports Medicine        Follow-up: Return if symptoms worsen or fail to improve.  [name], DO

## 2015-02-20 ENCOUNTER — Ambulatory Visit (INDEPENDENT_AMBULATORY_CARE_PROVIDER_SITE_OTHER): Payer: BLUE CROSS/BLUE SHIELD

## 2015-02-20 DIAGNOSIS — I639 Cerebral infarction, unspecified: Secondary | ICD-10-CM

## 2015-02-20 NOTE — Telephone Encounter (Signed)
Hospital follow up scheduled with Dr. Etter Sjogren on 02/23/15 @ 11:30 am.

## 2015-02-23 ENCOUNTER — Encounter: Payer: Self-pay | Admitting: Family Medicine

## 2015-02-23 ENCOUNTER — Ambulatory Visit (INDEPENDENT_AMBULATORY_CARE_PROVIDER_SITE_OTHER): Payer: BLUE CROSS/BLUE SHIELD | Admitting: Family Medicine

## 2015-02-23 VITALS — BP 125/78 | HR 90 | Temp 98.0°F | Wt 191.0 lb

## 2015-02-23 DIAGNOSIS — R339 Retention of urine, unspecified: Secondary | ICD-10-CM

## 2015-02-23 DIAGNOSIS — K5909 Other constipation: Secondary | ICD-10-CM

## 2015-02-23 DIAGNOSIS — T402X5A Adverse effect of other opioids, initial encounter: Secondary | ICD-10-CM

## 2015-02-23 DIAGNOSIS — N289 Disorder of kidney and ureter, unspecified: Secondary | ICD-10-CM

## 2015-02-23 DIAGNOSIS — I4891 Unspecified atrial fibrillation: Secondary | ICD-10-CM

## 2015-02-23 DIAGNOSIS — I10 Essential (primary) hypertension: Secondary | ICD-10-CM

## 2015-02-23 DIAGNOSIS — D62 Acute posthemorrhagic anemia: Secondary | ICD-10-CM | POA: Diagnosis not present

## 2015-02-23 DIAGNOSIS — N058 Unspecified nephritic syndrome with other morphologic changes: Secondary | ICD-10-CM

## 2015-02-23 DIAGNOSIS — E1129 Type 2 diabetes mellitus with other diabetic kidney complication: Secondary | ICD-10-CM

## 2015-02-23 DIAGNOSIS — K5903 Drug induced constipation: Secondary | ICD-10-CM

## 2015-02-23 DIAGNOSIS — K626 Ulcer of anus and rectum: Secondary | ICD-10-CM

## 2015-02-23 LAB — CBC WITH DIFFERENTIAL/PLATELET
Basophils Absolute: 0.1 10*3/uL (ref 0.0–0.1)
Basophils Relative: 0.5 % (ref 0.0–3.0)
Eosinophils Absolute: 0.2 10*3/uL (ref 0.0–0.7)
Eosinophils Relative: 1.8 % (ref 0.0–5.0)
HCT: 34.5 % — ABNORMAL LOW (ref 39.0–52.0)
Hemoglobin: 11.3 g/dL — ABNORMAL LOW (ref 13.0–17.0)
Lymphocytes Relative: 20.1 % (ref 12.0–46.0)
Lymphs Abs: 2.3 10*3/uL (ref 0.7–4.0)
MCHC: 32.6 g/dL (ref 30.0–36.0)
MCV: 88 fl (ref 78.0–100.0)
Monocytes Absolute: 0.6 10*3/uL (ref 0.1–1.0)
Monocytes Relative: 5.7 % (ref 3.0–12.0)
Neutro Abs: 8.1 10*3/uL — ABNORMAL HIGH (ref 1.4–7.7)
Neutrophils Relative %: 71.9 % (ref 43.0–77.0)
Platelets: 491 10*3/uL — ABNORMAL HIGH (ref 150.0–400.0)
RBC: 3.92 Mil/uL — ABNORMAL LOW (ref 4.22–5.81)
RDW: 13.9 % (ref 11.5–15.5)
WBC: 11.3 10*3/uL — ABNORMAL HIGH (ref 4.0–10.5)

## 2015-02-23 LAB — COMPREHENSIVE METABOLIC PANEL
ALT: 17 U/L (ref 0–53)
AST: 14 U/L (ref 0–37)
Albumin: 4.3 g/dL (ref 3.5–5.2)
Alkaline Phosphatase: 78 U/L (ref 39–117)
BUN: 21 mg/dL (ref 6–23)
CO2: 28 mEq/L (ref 19–32)
Calcium: 9.7 mg/dL (ref 8.4–10.5)
Chloride: 103 mEq/L (ref 96–112)
Creatinine, Ser: 1.06 mg/dL (ref 0.40–1.50)
GFR: 77.24 mL/min (ref >60.00)
Glucose, Bld: 117 mg/dL — ABNORMAL HIGH (ref 70–99)
Potassium: 4.1 mEq/L (ref 3.5–5.1)
Sodium: 141 mEq/L (ref 135–145)
Total Bilirubin: 0.4 mg/dL (ref 0.2–1.2)
Total Protein: 7.4 g/dL (ref 6.0–8.3)

## 2015-02-23 MED ORDER — AMLODIPINE BESYLATE 5 MG PO TABS: 5.0000 mg | ORAL_TABLET | Freq: Every day | ORAL | Status: DC

## 2015-02-23 MED ORDER — POLYETHYLENE GLYCOL 3350 17 GM/SCOOP PO POWD: 17.0000 g | Freq: Two times a day (BID) | ORAL | Status: DC | PRN

## 2015-02-23 NOTE — Assessment & Plan Note (Signed)
Check labs Improved in hospital

## 2015-02-23 NOTE — Assessment & Plan Note (Signed)
Diet controlled at home. °

## 2015-02-23 NOTE — Assessment & Plan Note (Signed)
Check cbc F/u GI Pt instructed on what to look for and not wait 6 hours if it ever occurs again but go to ER

## 2015-02-23 NOTE — Progress Notes (Signed)
Pre visit review using our clinic review tool, if applicable. No additional management support is needed unless otherwise documented below in the visit note. 

## 2015-02-23 NOTE — Patient Instructions (Signed)

## 2015-02-23 NOTE — Assessment & Plan Note (Signed)
Stable Much better control with norvasc Pt is very happy

## 2015-02-23 NOTE — Progress Notes (Signed)
Patient ID: Chase [name], male    DOB: Jun 10, 1961  Age: 54 y.o. MRN: [id number]    Subjective:  Subjective HPI Chase [name] presents for hosp f/u for gi bleed , a fib, renal insufficiency.  He has seen cardiology and is wearing a monitor and has f/u with GI as well.    Pt is still c/o shoulder pain but it has improved and he has a f/u with Dr Barbaraann Barthel at the end of the month.  Review of Systems  Constitutional: Negative for diaphoresis, appetite change, fatigue and unexpected weight change.  Eyes: Negative for pain, redness and visual disturbance.  Respiratory: Negative for cough, chest tightness, shortness of breath and wheezing.   Cardiovascular: Negative for chest pain, palpitations and leg swelling.  Gastrointestinal: Positive for anal bleeding. Negative for nausea, vomiting, abdominal pain and diarrhea.  Endocrine: Negative for cold intolerance, heat intolerance, polydipsia, polyphagia and polyuria.  Genitourinary: Positive for decreased urine volume. Negative for dysuria, frequency and difficulty urinating.  Neurological: Negative for dizziness, light-headedness, numbness and headaches.    History Past Medical History  Diagnosis Date  . Allergy   . Stroke   . Sleep apnea   . Headache(784.0)   . Arthritis   . GERD (gastroesophageal reflux disease)   . Hypertension   . Hyperlipidemia   . Hearing loss   . Nasal congestion   . Trouble swallowing   . Blood in stool   . Chest pain   . Leg swelling   . Constipation   . Rectal pain   . Difficulty urinating   . Thrombosed hemorrhoids   . Brain cyst   . PONV (postoperative nausea and vomiting)   . Diabetes mellitus without complication   . Stroke   . Rectal ulcer with bleeding after hemorrhoid banding 02/11/2015    He has past surgical history that includes Cataract extraction; Inner ear surgery; Eye surgery ([place]); Inner ear surgery (1992); surgery - right arm; and Flexible sigmoidoscopy (N/A, 02/11/2015).    His family history includes Breast cancer (age of onset: 67) in his mother; Heart attack in his father; Heart disease in his father; Hypertension in his brother and father; Stroke in his father. There is no history of Hyperlipidemia or Diabetes.He reports that he has never smoked. He has never used smokeless tobacco. He reports that he does not drink alcohol or use illicit drugs.  Current Outpatient Prescriptions on File Prior to Visit  Medication Sig Dispense Refill  . allopurinol (ZYLOPRIM) 100 MG tablet Take 1 tablet (100 mg total) by mouth daily. 90 tablet 1  . aspirin 81 MG chewable tablet Chew 1 tablet (81 mg total) by mouth daily. 90 tablet 3  . Azelastine HCl 0.15 % SOLN PLACE 2 SPRAYS IN EACH NOSTRIL every night    . cetirizine (ZYRTEC) 10 MG tablet Take 1 tablet (10 mg total) by mouth daily. 30 tablet 11  . cyclobenzaprine (FLEXERIL) 10 MG tablet Take 1 tablet (10 mg total) by mouth at bedtime. 30 tablet 3  . diazepam (VALIUM) 5 MG tablet Take 1 tablet (5 mg total) by mouth 3 (three) times daily. 90 tablet 0  . FIBER SELECT GUMMIES PO Take 1 tablet by mouth daily.    Marland Kitchen glucose blood (ONETOUCH VERIO) test strip Check blood sugar four times a day. Dx. E11.9 Onetouch Verio test strips 100 each 12  . HYDROcodone-acetaminophen (HYCET) 7.5-325 mg/15 ml solution TAKE 20 ML EVERY 6 HOURS AS NEEDED FOR PAIN 473 mL 0  .  hydrocortisone (ANUSOL-HC) 25 MG suppository PLACE 1 SUPPOSITORY INTO RECTUM TWICE A DAY 12 suppository 2  . hydrocortisone-pramoxine (ANALPRAM-HC) 2.5-1 % rectal cream Place rectally 3 (three) times daily as needed for hemorrhoids or itching. 30 g 0  . hydrOXYzine (ATARAX/VISTARIL) 25 MG tablet TAKE 1 TABLET (25 MG TOTAL) BY MOUTH EVERY 8 (EIGHT) HOURS AS NEEDED FOR ANXIETY OR ITCHING.    . meclizine (ANTIVERT) 25 MG tablet Take 1 tablet (25 mg total) by mouth 3 (three) times daily as needed. (Patient taking differently: Take 25 mg by mouth 3 (three) times daily as needed for  dizziness. ) 60 tablet 0  . metoprolol tartrate (LOPRESSOR) 25 MG tablet TAKE 1 TABLET TWICE A DAY, MAY CRUSH IN FOOD 180 tablet 1  . mometasone (ELOCON) 0.1 % ointment Apply 1 application topically daily as needed (for rash).    [name] DELICA LANCETS FINE MISC Check blood sugar four times per day. Dx:E11.9 100 each 12  . pravastatin (PRAVACHOL) 20 MG tablet Take 1 tablet (20 mg total) by mouth daily. 90 tablet 3  . ranitidine (ZANTAC) 75 MG/5ML syrup TAKE 10 MLS BY MOUTH 2 TIMES DAILY AS NEEDED. FOR INDIGESTION.    . tamsulosin (FLOMAX) 0.4 MG CAPS capsule Take 1 capsule (0.4 mg total) by mouth daily. 30 capsule 0  . venlafaxine XR (EFFEXOR-XR) 37.5 MG 24 hr capsule Take 1 capsule (37.5 mg total) by mouth daily. 30 capsule 5   No current facility-administered medications on file prior to visit.     Objective:  Objective Physical Exam  Constitutional: He is oriented to person, place, and time. Vital signs are normal. He appears well-developed and well-nourished. He is sleeping.  HENT:  Head: Normocephalic and atraumatic.  Mouth/Throat: Oropharynx is clear and moist.  Eyes: EOM are normal. Pupils are equal, round, and reactive to light.  Neck: Normal range of motion. Neck supple. No thyromegaly present.  Cardiovascular: Normal rate and regular rhythm.   No murmur heard. Pulmonary/Chest: Effort normal and breath sounds normal. No respiratory distress. He has no wheezes. He has no rales. He exhibits no tenderness.  Musculoskeletal: He exhibits no edema or tenderness.  Neurological: He is alert and oriented to person, place, and time.  Skin: Skin is warm and dry.  Psychiatric: He has a normal mood and affect. His behavior is normal. Judgment and thought content normal.   BP 125/78 mmHg  Pulse 90  Temp(Src) 98 F (36.7 C) (Oral)  Wt 191 lb (86.637 kg)  SpO2 100% Wt Readings from Last 3 Encounters:  02/23/15 191 lb (86.637 kg)  02/14/15 195 lb (88.451 kg)  02/13/15 196 lb (88.905  kg)     Lab Results  Component Value Date   WBC 8.2 02/12/2015   HGB 9.4* 02/12/2015   HCT 29.1* 02/12/2015   PLT 283 02/12/2015   GLUCOSE 134* 02/12/2015   CHOL 180 10/03/2014   TRIG 128.0 10/03/2014   HDL 32.90* 10/03/2014   LDLDIRECT 148.2 06/03/2013   LDLCALC 122* 10/03/2014   ALT 15* 02/11/2015   AST 17 02/11/2015   NA 141 02/12/2015   K 3.5 02/12/2015   CL 109 02/12/2015   CREATININE 1.02 02/12/2015   BUN 11 02/12/2015   CO2 23 02/12/2015   TSH 2.314 02/11/2015   PSA 1.01 10/03/2014   INR 1.11 02/10/2015   HGBA1C 6.8* 02/11/2015   MICROALBUR 3.5* 10/03/2014    US Venous Img Upper Uni Right  02/14/2015   CLINICAL DATA:  Right arm and neck  pain, recent hospitalization  EXAM: Right UPPER EXTREMITY VENOUS DOPPLER ULTRASOUND  TECHNIQUE: Gray-scale sonography with graded compression, as well as color Doppler and duplex ultrasound were performed to evaluate the upper extremity deep venous system from the level of the subclavian vein and including the jugular, axillary, basilic, radial, ulnar and upper cephalic vein. Spectral Doppler was utilized to evaluate flow at rest and with distal augmentation maneuvers.  COMPARISON:  None.  FINDINGS: Contralateral Subclavian Vein: Respiratory phasicity is normal and symmetric with the symptomatic side. No evidence of thrombus. Normal compressibility.  Internal Jugular Vein: No evidence of thrombus. Normal compressibility, respiratory phasicity and response to augmentation.  Subclavian Vein: No evidence of thrombus. Normal compressibility, respiratory phasicity and response to augmentation.  Axillary Vein: No evidence of thrombus. Normal compressibility, respiratory phasicity and response to augmentation.  Cephalic Vein: No evidence of thrombus. Normal compressibility, respiratory phasicity and response to augmentation.  Basilic Vein: No evidence of thrombus. Normal compressibility, respiratory phasicity and response to augmentation.  Brachial  Veins: No evidence of thrombus. Normal compressibility, respiratory phasicity and response to augmentation.  Radial Veins: No evidence of thrombus. Normal compressibility, respiratory phasicity and response to augmentation.  Ulnar Veins: No evidence of thrombus. Normal compressibility, respiratory phasicity and response to augmentation.  Venous Reflux:  None visualized.  Other Findings:  None visualized.  IMPRESSION: No evidence of deep venous thrombosis.   Electronically Signed   By: Conchita Paris M.D.   On: 02/14/2015 16:30   ekg-- nsr   Assessment & Plan:  Plan I am having Mr. Hollars start on polyethylene glycol powder. I am also having him maintain his [place] PO, cetirizine, venlafaxine XR, meclizine, glucose blood, ONETOUCH DELICA LANCETS FINE, allopurinol, hydrocortisone, ranitidine, hydrOXYzine, aspirin, pravastatin, diazepam, hydrocortisone-pramoxine, Azelastine HCl, mometasone, tamsulosin, cyclobenzaprine, HYDROcodone-acetaminophen, metoprolol tartrate, and amLODipine.  Meds ordered this encounter  Medications  . amLODipine (NORVASC) 5 MG tablet    Sig: Take 1 tablet (5 mg total) by mouth daily.    Dispense:  90 tablet    Refill:  1  . polyethylene glycol powder (GLYCOLAX/MIRALAX) powder    Sig: Take 17 g by mouth 2 (two) times daily as needed.    Dispense:  3350 g    Refill:  1    Problem List Items Addressed This Visit      Unprioritized   Rectal ulcer with bleeding after hemorrhoid banding    Check cbc F/u GI Pt instructed on what to look for and not wait 6 hours if it ever occurs again but go to ER      Essential hypertension   Relevant Medications   amLODipine (NORVASC) 5 MG tablet   DM (diabetes mellitus) type II controlled with renal manifestation    Diet controlled at home      Acute renal insufficiency    Check labs Improved in hospital         Other Visit Diagnoses    Urinary retention    -  Primary    Relevant Orders    Ambulatory  referral to Urology    Acute blood loss anemia        Relevant Orders    CBC with Differential/Platelet    Renal insufficiency        Relevant Orders    Comp Met (CMET)    Atrial fibrillation, unspecified        Relevant Medications    amLODipine (NORVASC) 5 MG tablet    Other Relevant Orders  EKG 12-Lead (Completed)    Constipation due to opioid therapy        Relevant Medications    polyethylene glycol powder (GLYCOLAX/MIRALAX) powder       Follow-up: Return in about 6 months (around 08/23/2015), or if symptoms worsen or fail to improve, for annual exam, fasting.  [name], DO

## 2015-02-26 ENCOUNTER — Other Ambulatory Visit: Payer: Self-pay | Admitting: Family Medicine

## 2015-02-27 ENCOUNTER — Telehealth: Payer: Self-pay | Admitting: Neurology

## 2015-02-27 ENCOUNTER — Other Ambulatory Visit: Payer: Self-pay

## 2015-02-27 DIAGNOSIS — Z9989 Dependence on other enabling machines and devices: Principal | ICD-10-CM

## 2015-02-27 DIAGNOSIS — G4733 Obstructive sleep apnea (adult) (pediatric): Secondary | ICD-10-CM

## 2015-02-27 NOTE — Telephone Encounter (Signed)
Pt needs new cpap supplies order sent to [facility]. I advised pt that Dr. Brett Fairy would have to sign the order and we will send it to [facility] this afternoon. Pt verbalized understanding.

## 2015-02-27 NOTE — Telephone Encounter (Signed)
Patient is calling and states that [place] needs a new prescription for his CPAP supplies.  Please call.

## 2015-03-01 ENCOUNTER — Other Ambulatory Visit: Payer: Medicaid Other

## 2015-03-13 ENCOUNTER — Telehealth: Payer: Self-pay | Admitting: *Deleted

## 2015-03-13 ENCOUNTER — Telehealth: Payer: Self-pay

## 2015-03-13 NOTE — Telephone Encounter (Signed)
-----   Message from Rosalin Hawking, MD sent at 03/13/2015  2:16 PM EDT ----- Hello, could you please again let the pt know that I just got his carotid doppler report, which is normal. Thanks.  Rosalin Hawking, MD PhD Stroke Neurology 03/13/2015 2:16 PM

## 2015-03-13 NOTE — Telephone Encounter (Signed)
Spoke to [place] - aware of results.

## 2015-03-13 NOTE — Telephone Encounter (Signed)
Rn call patient to notify him that his carotid doppler was normal. Patient verbalized understanding of test results. Also nurse told patient that his 30 day cardiac event monitoring showed no atrial fibrillation and his TCD of the head showed mild atherosclerosis. Patient understands both test results.

## 2015-03-13 NOTE — Telephone Encounter (Signed)
-----   Message from Desmond Lope, RN sent at 03/13/2015  2:24 PM EDT -----   ----- Message -----    From: Rosalin Hawking, MD    Sent: 03/13/2015   2:16 PM      To: [place], could you please again let the pt know that I just got his carotid doppler report, which is normal. Thanks.  Rosalin Hawking, MD PhD Stroke Neurology 03/13/2015 2:16 PM

## 2015-03-15 ENCOUNTER — Encounter: Payer: Self-pay | Admitting: Family Medicine

## 2015-03-15 ENCOUNTER — Ambulatory Visit (INDEPENDENT_AMBULATORY_CARE_PROVIDER_SITE_OTHER): Payer: BLUE CROSS/BLUE SHIELD | Admitting: Family Medicine

## 2015-03-15 VITALS — BP 127/82 | HR 86 | Ht 70.0 in | Wt 192.0 lb

## 2015-03-15 DIAGNOSIS — M25511 Pain in right shoulder: Secondary | ICD-10-CM | POA: Diagnosis not present

## 2015-03-15 NOTE — Progress Notes (Signed)
PCP and referred by: [name], DO  Subjective:   HPI: Patient is a 54 y.o. male here for right shoulder pain.  8/31: Patient denies known injury. He was recently hospitalized for a lower GI bleed. Reports he had pain in right shoulder off and on day after he woke up when in the hospital. Pain has intensified and become 10/10 level. Unable to move right arm without pain. Is right handed. Taking norco and flexeril. No prior issues with this shoulder.  9/29: Patient reports he feels improved since last visit. Motion much better. Pain level 5/10 felt deep in right shoulder. Worse with overhead motions. Pain sharp with these motions. No fevers, skin changes.  Past Medical History  Diagnosis Date  . Allergy   . Stroke   . Sleep apnea   . Headache(784.0)   . Arthritis   . GERD (gastroesophageal reflux disease)   . Hypertension   . Hyperlipidemia   . Hearing loss   . Nasal congestion   . Trouble swallowing   . Blood in stool   . Chest pain   . Leg swelling   . Constipation   . Rectal pain   . Difficulty urinating   . Thrombosed hemorrhoids   . Brain cyst   . PONV (postoperative nausea and vomiting)   . Diabetes mellitus without complication   . Stroke   . Rectal ulcer with bleeding after hemorrhoid banding 02/11/2015    Current Outpatient Prescriptions on File Prior to Visit  Medication Sig Dispense Refill  . allopurinol (ZYLOPRIM) 100 MG tablet Take 1 tablet (100 mg total) by mouth daily. 90 tablet 1  . amLODipine (NORVASC) 5 MG tablet Take 1 tablet (5 mg total) by mouth daily. 90 tablet 1  . aspirin 81 MG chewable tablet Chew 1 tablet (81 mg total) by mouth daily. 90 tablet 3  . Azelastine HCl 0.15 % SOLN PLACE 2 SPRAYS IN EACH NOSTRIL every night    . cetirizine (ZYRTEC) 10 MG tablet Take 1 tablet (10 mg total) by mouth daily. 30 tablet 11  . cyclobenzaprine (FLEXERIL) 10 MG tablet Take 1 tablet (10 mg total) by mouth at bedtime. 30 tablet 3  . diazepam  (VALIUM) 5 MG tablet Take 1 tablet (5 mg total) by mouth 3 (three) times daily. 90 tablet 0  . FIBER SELECT GUMMIES PO Take 1 tablet by mouth daily.    Marland Kitchen glucose blood (ONETOUCH VERIO) test strip Check blood sugar four times a day. Dx. E11.9 Onetouch Verio test strips 100 each 12  . HYDROcodone-acetaminophen (HYCET) 7.5-325 mg/15 ml solution TAKE 20 ML EVERY 6 HOURS AS NEEDED FOR PAIN 473 mL 0  . hydrocortisone (ANUSOL-HC) 25 MG suppository PLACE 1 SUPPOSITORY INTO RECTUM TWICE A DAY 12 suppository 2  . hydrocortisone-pramoxine (ANALPRAM-HC) 2.5-1 % rectal cream Place rectally 3 (three) times daily as needed for hemorrhoids or itching. 30 g 0  . hydrOXYzine (ATARAX/VISTARIL) 25 MG tablet TAKE 1 TABLET (25 MG TOTAL) BY MOUTH EVERY 8 (EIGHT) HOURS AS NEEDED FOR ANXIETY OR ITCHING.    . meclizine (ANTIVERT) 25 MG tablet Take 1 tablet (25 mg total) by mouth 3 (three) times daily as needed. (Patient taking differently: Take 25 mg by mouth 3 (three) times daily as needed for dizziness. ) 60 tablet 0  . metoprolol tartrate (LOPRESSOR) 25 MG tablet TAKE 1 TABLET TWICE A DAY, MAY CRUSH IN FOOD 180 tablet 1  . mometasone (ELOCON) 0.1 % ointment Apply 1 application topically daily as needed (  for rash).    [name] DELICA LANCETS FINE MISC Check blood sugar four times per day. Dx:E11.9 100 each 12  . polyethylene glycol powder (GLYCOLAX/MIRALAX) powder Take 17 g by mouth 2 (two) times daily as needed. 3350 g 1  . pravastatin (PRAVACHOL) 20 MG tablet Take 1 tablet (20 mg total) by mouth daily. 90 tablet 3  . ranitidine (ZANTAC) 75 MG/5ML syrup TAKE 10 MLS BY MOUTH 2 TIMES DAILY AS NEEDED. FOR INDIGESTION.    . tamsulosin (FLOMAX) 0.4 MG CAPS capsule Take 1 capsule (0.4 mg total) by mouth daily. 30 capsule 0  . venlafaxine XR (EFFEXOR-XR) 37.5 MG 24 hr capsule Take 1 capsule (37.5 mg total) by mouth daily. 30 capsule 5   No current facility-administered medications on file prior to visit.    Past Surgical  History  Procedure Laterality Date  . Cataract extraction      x 3  . Inner ear surgery      rt ear  . Eye surgery  1968, 1985, 1987    cataracts  . Inner ear surgery  1992  . Surgery - right arm      1983  . Flexible sigmoidoscopy N/A 02/11/2015    Procedure: FLEXIBLE SIGMOIDOSCOPY;  Surgeon: Gatha Mayer, MD;  Location: [facility];  Service: Endoscopy;  Laterality: N/A;    Allergies  Allergen Reactions  . Amoxicillin Hives, Itching and Other (See Comments)    White tongue; ? hives  . Flonase [Fluticasone Propionate]     Nasal Sores  . Terfenadine     ? reaction    Social History   Social History  . Marital Status: Single    Spouse Name: N/A  . Number of Children: 0  . Years of Education: N/A   Occupational History  . Not on file.   Social History Main Topics  . Smoking status: Never Smoker   . Smokeless tobacco: Never Used  . Alcohol Use: No     Comment: none  . Drug Use: No  . Sexual Activity: No   Other Topics Concern  . Not on file   Social History Narrative   No caffeine intake except for chocolate.    Family History  Problem Relation Age of Onset  . Breast cancer Mother 59    breast  . Stroke Father   . Heart disease Father   . Hypertension Father   . Heart attack Father   . Hypertension Brother   . Hyperlipidemia Neg Hx   . Diabetes Neg Hx     BP 127/82 mmHg  Pulse 86  Ht 5\' 10"  (1.778 m)  Wt 192 lb (87.091 kg)  BMI 27.55 kg/m2  Review of Systems: See HPI above.    Objective:  Physical Exam:  Gen: NAD, afebrile  Right shoulder: No obvious swelling, warmth, erythema, other deformity. TTP lateral shoulder. Full IR.  ER to 70 degrees.  Abduction to 160 degrees, flexion 150 degrees Positive hawkins,  neers.  Negative Yergasons. Empty can, IR/ER 5/5 strength.  Mild pain empty can and ER. Negative apprehension. NV intact distally.  Left shoulder: FROM without pain or weakness.    Assessment & Plan:  1. Right shoulder  pain - 2/2 adhesive capsulitis and rotator cuff tendinitis.  Adhesive capsulitis much improved though still with tendinitis.  Shown home exercises to do with theraband daily.  Consider physical therapy if not improving.  S/p combination glenohumeral and subacromial injection.  Heat as needed.  F/u in 6 weeks.

## 2015-03-15 NOTE — Patient Instructions (Signed)
Your frozen shoulder is much better. I would not recommend repeating an injection at this time. Let me know if you want to do physical therapy though it sounds like this would be very expensive. Limit lifting and overhead activities as much as possible for another 6 weeks. Heat 15 minutes at a time 3-4 times a day may help with movement and stiffness. Codman exercises (pendulum, wall walking or table slides, arm circles) - do 3 sets of 10 once or twice a day. Start theraband strengthening exercises 3 sets of 10 once a day. Follow up in 6 weeks.

## 2015-03-15 NOTE — Assessment & Plan Note (Signed)
2/2 adhesive capsulitis and rotator cuff tendinitis.  Adhesive capsulitis much improved though still with tendinitis.  Shown home exercises to do with theraband daily.  Consider physical therapy if not improving.  S/p combination glenohumeral and subacromial injection.  Heat as needed.  F/u in 6 weeks.

## 2015-03-21 ENCOUNTER — Other Ambulatory Visit: Payer: Self-pay | Admitting: Family Medicine

## 2015-03-26 ENCOUNTER — Telehealth: Payer: Self-pay | Admitting: *Deleted

## 2015-03-26 NOTE — Telephone Encounter (Signed)
I relayed result to pt of his cardiac event monitor.  He verbalized understanding.   He will keep f/u 04-02-15.

## 2015-03-26 NOTE — Telephone Encounter (Signed)
-----   Message from Rosalin Hawking, MD sent at 03/23/2015  1:56 PM EDT ----- Hi, Katrina: You please let patient know that his cardiac event monitoring did not show any atrial fibrillation. Continue current treatment plan. Thanks.  Rosalin Hawking, MD PhD Stroke Neurology 03/23/2015 1:56 PM

## 2015-04-02 ENCOUNTER — Encounter: Payer: Self-pay | Admitting: Neurology

## 2015-04-02 ENCOUNTER — Ambulatory Visit (INDEPENDENT_AMBULATORY_CARE_PROVIDER_SITE_OTHER): Payer: BLUE CROSS/BLUE SHIELD | Admitting: Neurology

## 2015-04-02 VITALS — BP 107/67 | HR 84 | Ht 71.0 in | Wt 194.2 lb

## 2015-04-02 DIAGNOSIS — I10 Essential (primary) hypertension: Secondary | ICD-10-CM

## 2015-04-02 DIAGNOSIS — E348 Other specified endocrine disorders: Secondary | ICD-10-CM

## 2015-04-02 DIAGNOSIS — E785 Hyperlipidemia, unspecified: Secondary | ICD-10-CM | POA: Diagnosis not present

## 2015-04-02 NOTE — Patient Instructions (Signed)
-   continue ASA EC 81mg  and pravastatin for stroke prevention - check BP and glucose at home - Follow up with your primary care physician for stroke risk factor modification. Recommend maintain blood pressure goal <130/80, diabetes with hemoglobin A1c goal below 6.5% and lipids with LDL cholesterol goal below 70 mg/dL.  - continue follow up with Dr. Brett Fairy for OSA - follow up as needed.

## 2015-04-02 NOTE — Progress Notes (Signed)
STROKE NEUROLOGY FOLLOW UP NOTE  NAME: Chase [name] DOB: July 15, 1960  REASON FOR VISIT: stroke follow up HISTORY FROM: chart and pt  Today we had the pleasure of seeing Chase [name] in follow-up at our Neurology Clinic. Pt was accompanied by no one.   History Summary Chase [name] is a 54 y.o. 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   Pt stated that he never had clear episode of stroke like symptoms. He has chronic symptoms of headache, forgetfulness, difficulty with concentration, sometimes difficulty getting words out, slow reaction and answering questions. He has been following with Dr. Erling Cruz about these issues. Had series MRI showed stable pineal cyst and also progression of ischemic white matter changes. MRI brain in 10/2008 compared to have an acute or subacute infarct at right frontal deep white matter. Patient again had a recent MRI in 10/2014 showed scattered T2 hyperintense signal in the left caudate head, right putamen and corona radiata, right pounds as well as a right middle cerebral peduncle, seems progressed from before and likely representing asymptomatic lacunar infarcts versus chronic microvascular ischemia, although chronic headache may have similar appearance. A1c was 6.8 in April and LDL 122, normal TSH. He was referred to stroke clinic for further evaluation. He has not been on any antiplatelet, as he heard that aspirin could hurt his stomach. He was on Lipitor in the past but was taken off due to muscle pain.  He has been also following up with Dr. Brett Fairy for sleep apnea on CPAP. He has long-standing history of hypertension, diabetes, hyperlipidemia, following up with PCP, and was told in good control. He also admitted that he sometimes feels heart palpitation, skipped beat, he happened 1-3 times per week, has not been evaluated for this.  Interval history During the  interval time, patient has been doing well. Last visit on 01/26/2015. However on 02/10/2015, he had ER visit due to rectal bleeding. Second day he had colonoscopy showing hemorrhoid,s/p clipping. On discharge he is aspirin 81 was resumed. However patient stated that he never took aspirin after discharge. Since discharge he has no GI bleeding. Glucose average about 130s. Blood pressure average 120 to 130. Today blood pressure 107/67 in clinic. He is compliant with CPAP machine at night.  REVIEW OF SYSTEMS: Full 14 system review of systems performed and notable only for those listed below and in HPI above, all others are negative:  Constitutional:   Cardiovascular: Chest pain, palpitation Ear/Nose/Throat:  Hearing loss, ringing years, runny nose, trouble swallowing Skin: Itching Eyes:  Eye itching, eye redness Respiratory:  SOB Gastroitestinal:  Rectal bleeding, black stools, rectal pain, incontinence of bowels Genitourinary: 10 4 urination, incontinence of bladder, frequency of urination, urgency Hematology/Lymphatic:   Endocrine: Cold intolerance, excessive eating Musculoskeletal:  Joint pain, walking difficulty, neck stiffness, aching muscles Allergy/Immunology:  Allergies Neurological:  Memory loss, dizziness, speech difficulty Psychiatric: Agitation, confusion, decreased concentration, depression, nurse, anxious Sleep: Apnea, daytime sleepiness, snoring  The following represents the patient's updated allergies and side effects list: Allergies  Allergen Reactions  . Amoxicillin Hives, Itching and Other (See Comments)    White tongue; ? hives  . Flonase [Fluticasone Propionate]     Nasal Sores  . Terfenadine     ? reaction    The neurologically relevant items on the patient's problem list were reviewed on today's visit.  Neurologic Examination  A problem focused neurological  exam (12 or more points of the single system neurologic examination, vital signs counts as 1 point, cranial  nerves count for 8 points) was performed.  Blood pressure 107/67, pulse 84, height 5\' 11"  (1.803 m), weight 194 lb 3.2 oz (88.089 kg).  General - Well nourished, well developed, in no apparent distress.  Ophthalmologic - Fundi not visualized due to eye movement.  Cardiovascular - Regular rate and rhythm.  Mental Status -  Awake, alert, AAO x 3. Language examination including expression, comprehension, naming and repetition intact.  Cranial Nerves II - XII - II - Visual field intact OU. III, IV, VI - Extraocular movements intact. V - Facial sensation intact bilaterally. VII - Facial movement intact bilaterally. VIII - Hard of hearing & vestibular intact bilaterally. X - Palate elevates symmetrically. XI - Chin turning & shoulder shrug intact bilaterally. XII - Tongue protrusion intact.  Motor Strength - The patient's strength was normal in all extremities and pronator drift was absent.  Bulk was normal and fasciculations were absent.   Motor Tone - Muscle tone was assessed at the neck and appendages and was normal.  Reflexes - The patient's reflexes were 1+ in all extremities and he had no pathological reflexes.  Sensory - Light touch, temperature/pinprick, vibration and proprioception, and Romberg testing were assessed and were normal.    Coordination - The patient had normal movements in the hands and feet with no ataxia or dysmetria.  Tremor was absent.  Gait and Station - The patient's transfers, posture, gait, station, and turns were observed as normal.  Data reviewed: I personally reviewed the images and agree with the radiology interpretations.  MRI 10/28/14 - Abnormal MRI scan of the brain showing scattered lacunar infarcts in the left caudate head, right putamen and corona radiata, right pons as well as right middle cerebellar peduncle with mild changes of chronic microvascular ischemia. These appear slightly advanced compared with previous MRI scan from 10/06/2012. Stable  15x13x8 mm complex pineal region cyst compared with MRI scan 10/06/2012.  CUS - no hemodynamically significant stenosis bilateral ICAs or VAs  TCD - mild diffuse intracranial atherosclerosis  2D echo - - Left ventricle: The cavity size was mildly dilated. Wall thickness was increased in a pattern of mild LVH. Systolic function was normal. The estimated ejection fraction was in the range of 55% to 60%. Wall motion was normal; there were no regional wall motion abnormalities. Features are consistent with a pseudonormal left ventricular filling pattern, with concomitant abnormal relaxation and increased filling pressure (grade 2 diastolic dysfunction). Doppler parameters are consistent with elevated ventricular end-diastolic filling pressure. - Left atrium: The atrium was mildly dilated. - Right ventricle: The cavity size was normal. Wall thickness was normal. Systolic function was normal. - Pulmonic valve: There was trivial regurgitation. - Compared with study 01/31/09, no significant changes.  30 day cardiac event monitoring - negative for atrial fibrillation  Component     Latest Ref Rng 10/03/2014  Total Bilirubin     0.2 - 1.2 mg/dL 0.6  Bilirubin, Direct     0.0 - 0.3 mg/dL 0.1  Alkaline Phosphatase     39 - 117 U/L 79  AST     0 - 37 U/L 14  ALT     0 - 53 U/L 17  Total Protein     6.0 - 8.3 g/dL 7.1  Albumin     3.5 - 5.2 g/dL 4.5  Cholesterol     0 - 200 mg/dL 180  Triglycerides  0.0 - 149.0 mg/dL 128.0  HDL Cholesterol     >39.00 mg/dL 32.90 (L)  VLDL     0.0 - 40.0 mg/dL 25.6  LDL (calc)     0 - 99 mg/dL 122 (H)  Total CHOL/HDL Ratio      5  NonHDL      147.10  Hemoglobin A1C     4.6 - 6.5 % 6.8 (H)  TSH     0.35 - 4.50 uIU/mL 2.43    Assessment: As you may recall, he is a 54 y.o. 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 He has never had clear episode of stroke symptoms but all nonspecific such as headache, forgetfulness, difficulty with concentration, sometimes difficulty getting words out, slow reaction and answering questions. Recent MRI did show multiple hyper T2 signals in the area common of small vessel infarcts although those could also be seen in pt with long standing headache. However, he does have multiple risk factors for stroke, including HTN, DM, HLD, OSA, so stroke work up done including 2D echo, TCD, CUS and surgery Card[employer] monitoring which were all unremarkable. LDL 122 and A1c 6.8. Patient had interval rectal bleeding due to hemorrhoid status post clipping. Will resume aspirin 81.  Plan:  -Continue aspirin 81 and pravastatin for stroke prevention - check BP and glucose at home - Follow up with your primary care physician for stroke risk factor modification. Recommend maintain blood pressure goal <130/80, diabetes with hemoglobin A1c goal below 6.5% and lipids with LDL cholesterol goal below 70 mg/dL.  - continue follow up with Dr. Brett Fairy for OSA - RTC PRN.  I spent more than 25 minutes of face to face time with the patient. Greater than 50% of time was spent in counseling and coordination of care. We have discussed about stroke prevention, and medication compliance.   No orders of the defined types were placed in this encounter.    No orders of the defined types were placed in this encounter.    Patient Instructions  - continue ASA EC 81mg  and pravastatin for stroke prevention - check BP and glucose at home - Follow up with your primary care physician for stroke risk factor modification. Recommend maintain blood pressure goal <130/80, diabetes with hemoglobin A1c goal below 6.5% and lipids with LDL cholesterol goal below 70 mg/dL.  - continue follow up with Dr. Brett Fairy for OSA - follow up as needed.    Rosalin Hawking, MD PhD [facility] [address]  [place], [place] [address] [phone]

## 2015-04-04 ENCOUNTER — Other Ambulatory Visit: Payer: Self-pay | Admitting: Family Medicine

## 2015-04-06 ENCOUNTER — Other Ambulatory Visit: Payer: Self-pay | Admitting: Physician Assistant

## 2015-04-06 ENCOUNTER — Other Ambulatory Visit: Payer: Self-pay | Admitting: Family Medicine

## 2015-04-06 NOTE — Telephone Encounter (Signed)
Last seen 02/23/15 and filled 02/12/15 #[id number]   Please advise     KP

## 2015-04-06 NOTE — Telephone Encounter (Signed)
Will defer further refills of patient's medications to PCP  

## 2015-04-06 NOTE — Telephone Encounter (Signed)
Please advise if refill appropriate.      KP 

## 2015-04-18 ENCOUNTER — Other Ambulatory Visit: Payer: Self-pay | Admitting: Family Medicine

## 2015-04-23 ENCOUNTER — Ambulatory Visit: Payer: BLUE CROSS/BLUE SHIELD | Admitting: Neurology

## 2015-04-25 ENCOUNTER — Ambulatory Visit (INDEPENDENT_AMBULATORY_CARE_PROVIDER_SITE_OTHER): Payer: BLUE CROSS/BLUE SHIELD | Admitting: Family Medicine

## 2015-04-25 ENCOUNTER — Encounter: Payer: Self-pay | Admitting: Family Medicine

## 2015-04-25 VITALS — BP 131/83 | HR 81 | Ht 71.0 in | Wt 195.0 lb

## 2015-04-25 DIAGNOSIS — M25511 Pain in right shoulder: Secondary | ICD-10-CM | POA: Diagnosis not present

## 2015-04-25 NOTE — Patient Instructions (Signed)
Consider physical therapy (when we looked into this last time it sounded like it would be expensive even with your insurance but we could double check). Consider nitro patches - 1/4th patch over affected shoulder, change daily. Limit lifting and overhead activities as much as possible. Heat 15 minutes at a time 3-4 times a day may help with movement and stiffness. Codman exercises (pendulum, wall walking or table slides, arm circles) - do 3 sets of 10 once or twice a day. Continue theraband strengthening exercises 3 sets of 10 once a day. Follow up with me in 6 weeks or as needed if you're doing well.

## 2015-04-26 NOTE — Addendum Note (Signed)
Addended by: Sherrie George F on: 04/26/2015 02:11 PM   Modules accepted: Orders

## 2015-04-26 NOTE — Progress Notes (Signed)
PCP and referred by: Chase Koyanagi, DO  Subjective:   HPI: Patient is a 54 y.o. male here for right shoulder pain.  8/31: Patient denies known injury. He was recently hospitalized for a lower GI bleed. Reports he had pain in right shoulder off and on day after he woke up when in the hospital. Pain has intensified and become 10/10 level. Unable to move right arm without pain. Is right handed. Taking norco and flexeril. No prior issues with this shoulder.  9/29: Patient reports he feels improved since last visit. Motion much better. Pain level 5/10 felt deep in right shoulder. Worse with overhead motions. Pain sharp with these motions. No fevers, skin changes.  11/9: Patient reports he continues to improve. Pain level down to 3/10 level. Sharp, occasional lateral pain. Occasionally taking tylenol with codeine. Doing home exercises sparingly. No fever, skin changes, other complaints.  Past Medical History  Diagnosis Date  . Allergy   . Stroke ([place])   . Sleep apnea   . Headache(784.0)   . Arthritis   . GERD (gastroesophageal reflux disease)   . Hypertension   . Hyperlipidemia   . Hearing loss   . Nasal congestion   . Trouble swallowing   . Blood in stool   . Chest pain   . Leg swelling   . Constipation   . Rectal pain   . Difficulty urinating   . Thrombosed hemorrhoids   . Brain cyst   . PONV (postoperative nausea and vomiting)   . Diabetes mellitus without complication ([place])   . Stroke ([place])   . Rectal ulcer with bleeding after hemorrhoid banding 02/11/2015    Current Outpatient Prescriptions on File Prior to Visit  Medication Sig Dispense Refill  . allopurinol (ZYLOPRIM) 100 MG tablet Take 1 tablet (100 mg total) by mouth daily. 90 tablet 1  . amLODipine (NORVASC) 5 MG tablet Take 1 tablet (5 mg total) by mouth daily. 90 tablet 1  . aspirin 81 MG chewable tablet Chew 1 tablet (81 mg total) by mouth daily. 90 tablet 3  . Azelastine HCl 0.15 % SOLN PLACE 2  SPRAYS IN EACH NOSTRIL every night    . cetirizine (ZYRTEC) 10 MG tablet Take 1 tablet (10 mg total) by mouth daily. 30 tablet 11  . cyclobenzaprine (FLEXERIL) 10 MG tablet Take 1 tablet (10 mg total) by mouth at bedtime. 30 tablet 3  . diazepam (VALIUM) 5 MG tablet TAKE 1 TABLET 3 TIMES A DAY 90 tablet 0  . FIBER SELECT GUMMIES PO Take 1 tablet by mouth daily.    Marland Kitchen glucose blood (ONETOUCH VERIO) test strip Check blood sugar four times a day. Dx. E11.9 Onetouch Verio test strips 100 each 12  . HYDROcodone-acetaminophen (HYCET) 7.5-325 mg/15 ml solution TAKE 20 ML EVERY 6 HOURS AS NEEDED FOR PAIN 473 mL 0  . hydrocortisone (ANUSOL-HC) 25 MG suppository PLACE 1 SUPPOSITORY INTO RECTUM TWICE A DAY 12 suppository 2  . hydrocortisone-pramoxine (ANALPRAM-HC) 2.5-1 % rectal cream Place rectally 3 (three) times daily as needed for hemorrhoids or itching. 30 g 0  . hydrOXYzine (ATARAX/VISTARIL) 25 MG tablet TAKE 1 TABLET (25 MG TOTAL) BY MOUTH EVERY 8 (EIGHT) HOURS AS NEEDED FOR ANXIETY OR ITCHING. 90 tablet 0  . meclizine (ANTIVERT) 25 MG tablet Take 1 tablet (25 mg total) by mouth 3 (three) times daily as needed. (Patient taking differently: Take 25 mg by mouth 3 (three) times daily as needed for dizziness. ) 60 tablet 0  . metoprolol  tartrate (LOPRESSOR) 25 MG tablet TAKE 1 TABLET TWICE A DAY, MAY CRUSH IN FOOD 180 tablet 0  . mometasone (ELOCON) 0.1 % ointment Apply 1 application topically daily as needed (for rash).    [name] DELICA LANCETS FINE MISC Check blood sugar four times per day. Dx:E11.9 100 each 12  . oxybutynin (DITROPAN) 5 MG/5ML syrup TAKE 1 TEASPOONFUL 3 TIMES A DAY  11  . polyethylene glycol powder (GLYCOLAX/MIRALAX) powder Take 17 g by mouth 2 (two) times daily as needed. 3350 g 1  . pravastatin (PRAVACHOL) 20 MG tablet Take 1 tablet (20 mg total) by mouth daily. 90 tablet 3  . ranitidine (ZANTAC) 75 MG/5ML syrup TAKE 10 MLS BY MOUTH 2 TIMES DAILY AS NEEDED. FOR INDIGESTION. 300 mL 0   . venlafaxine XR (EFFEXOR-XR) 37.5 MG 24 hr capsule Take 1 capsule (37.5 mg total) by mouth daily. 30 capsule 5   No current facility-administered medications on file prior to visit.    Past Surgical History  Procedure Laterality Date  . Cataract extraction      x 3  . Inner ear surgery      rt ear  . Eye surgery  1968, 1985, 1987    cataracts  . Inner ear surgery  1992  . Surgery - right arm      1983  . Flexible sigmoidoscopy N/A 02/11/2015    Procedure: FLEXIBLE SIGMOIDOSCOPY;  Surgeon: Gatha Mayer, MD;  Location: [facility];  Service: Endoscopy;  Laterality: N/A;    Allergies  Allergen Reactions  . Amoxicillin Hives, Itching and Other (See Comments)    White tongue; ? hives  . Flonase [Fluticasone Propionate]     Nasal Sores  . Terfenadine     ? reaction    Social History   Social History  . Marital Status: Single    Spouse Name: N/A  . Number of Children: 0  . Years of Education: N/A   Occupational History  . Not on file.   Social History Main Topics  . Smoking status: Never Smoker   . Smokeless tobacco: Never Used  . Alcohol Use: No     Comment: none  . Drug Use: No  . Sexual Activity: No   Other Topics Concern  . Not on file   Social History Narrative   No caffeine intake except for chocolate.    Family History  Problem Relation Age of Onset  . Breast cancer Mother 66    breast  . Stroke Father   . Heart disease Father   . Hypertension Father   . Heart attack Father   . Hypertension Brother   . Hyperlipidemia Neg Hx   . Diabetes Neg Hx     BP 131/83 mmHg  Pulse 81  Ht 5\' 11"  (1.803 m)  Wt 195 lb (88.451 kg)  BMI 27.21 kg/m2  Review of Systems: See HPI above.    Objective:  Physical Exam:  Gen: NAD, afebrile  Right shoulder: No obvious swelling, warmth, erythema, other deformity. Minimal TTP lateral shoulder. Full IR.  ER to 70 degrees.  Abduction to 160 degrees, flexion 150 degrees Negative hawkins, neers.  Negative  Yergasons. Empty can, IR/ER 5/5 strength.  Mild pain empty can. Negative apprehension. NV intact distally.  Left shoulder: FROM without pain or weakness.    Assessment & Plan:  1. Right shoulder pain - 2/2 adhesive capsulitis and rotator cuff tendinitis.  Continues to improve though not completely.  He will consider physical therapy, nitro  patches in addition to regularly doing home exercises.  Heat as needed.  F/u in 6 weeks or prn.

## 2015-04-26 NOTE — Assessment & Plan Note (Signed)
2/2 adhesive capsulitis and rotator cuff tendinitis.  Continues to improve though not completely.  He will consider physical therapy, nitro patches in addition to regularly doing home exercises.  Heat as needed.  F/u in 6 weeks or prn.

## 2015-05-15 ENCOUNTER — Other Ambulatory Visit: Payer: Self-pay | Admitting: Family Medicine

## 2015-05-16 NOTE — Telephone Encounter (Signed)
Last filled: 04/18/15 Amt: 300 ml, 0 Last OV:  02/23/15  Med filled.

## 2015-05-28 ENCOUNTER — Other Ambulatory Visit: Payer: Self-pay | Admitting: Family Medicine

## 2015-05-28 ENCOUNTER — Other Ambulatory Visit: Payer: Self-pay | Admitting: Neurology

## 2015-05-31 ENCOUNTER — Other Ambulatory Visit: Payer: Self-pay

## 2015-05-31 DIAGNOSIS — M25511 Pain in right shoulder: Secondary | ICD-10-CM

## 2015-05-31 MED ORDER — CYCLOBENZAPRINE HCL 10 MG PO TABS: 10.0000 mg | ORAL_TABLET | Freq: Every day | ORAL | Status: DC

## 2015-06-01 ENCOUNTER — Other Ambulatory Visit: Payer: Self-pay | Admitting: *Deleted

## 2015-06-05 ENCOUNTER — Telehealth: Payer: Self-pay | Admitting: *Deleted

## 2015-06-05 ENCOUNTER — Other Ambulatory Visit: Payer: Self-pay | Admitting: *Deleted

## 2015-06-05 MED ORDER — HYDROCORTISONE ACETATE 25 MG RE SUPP: 25.0000 mg | Freq: Two times a day (BID) | RECTAL | Status: DC

## 2015-06-05 NOTE — Telephone Encounter (Signed)
Per Nicoletta Ba Pa, Sent refills for the Hydrocortisone suppositories 25 mg  To [facility], [address]  Patient advised to call for appointment with Dr. Zenovia Jarred if needed.

## 2015-07-02 ENCOUNTER — Other Ambulatory Visit: Payer: Self-pay | Admitting: Family Medicine

## 2015-07-02 ENCOUNTER — Telehealth: Payer: Self-pay | Admitting: Family Medicine

## 2015-07-02 NOTE — Telephone Encounter (Signed)
LM for pt to call and schedule flu shot or update records. °

## 2015-07-05 ENCOUNTER — Other Ambulatory Visit: Payer: Self-pay | Admitting: Family Medicine

## 2015-07-05 ENCOUNTER — Encounter: Payer: Self-pay | Admitting: Family Medicine

## 2015-07-05 DIAGNOSIS — J302 Other seasonal allergic rhinitis: Secondary | ICD-10-CM

## 2015-07-05 MED ORDER — AZELASTINE HCL 0.15 % NA SOLN: NASAL | Status: DC

## 2015-07-05 NOTE — Telephone Encounter (Signed)
Please advise      KP 

## 2015-07-07 ENCOUNTER — Other Ambulatory Visit: Payer: Self-pay | Admitting: Family Medicine

## 2015-07-09 NOTE — Telephone Encounter (Signed)
Last seen 02/23/15 and filled 04/06/15 #90   Please advise      KP

## 2015-07-19 ENCOUNTER — Other Ambulatory Visit: Payer: Self-pay | Admitting: Family Medicine

## 2015-07-19 DIAGNOSIS — J302 Other seasonal allergic rhinitis: Secondary | ICD-10-CM

## 2015-07-19 MED ORDER — AZELASTINE HCL 0.15 % NA SOLN: NASAL | Status: DC

## 2015-08-01 ENCOUNTER — Other Ambulatory Visit: Payer: Self-pay | Admitting: Family Medicine

## 2015-08-21 ENCOUNTER — Ambulatory Visit (INDEPENDENT_AMBULATORY_CARE_PROVIDER_SITE_OTHER): Payer: BLUE CROSS/BLUE SHIELD | Admitting: Family Medicine

## 2015-08-21 ENCOUNTER — Encounter: Payer: Self-pay | Admitting: Family Medicine

## 2015-08-21 ENCOUNTER — Telehealth: Payer: Self-pay | Admitting: Family Medicine

## 2015-08-21 VITALS — BP 133/79 | HR 91 | Temp 98.4°F | Ht 71.0 in | Wt 195.0 lb

## 2015-08-21 DIAGNOSIS — J019 Acute sinusitis, unspecified: Secondary | ICD-10-CM | POA: Diagnosis not present

## 2015-08-21 MED ORDER — CEPHALEXIN 250 MG/5ML PO SUSR: 500.0000 mg | Freq: Three times a day (TID) | ORAL | Status: DC

## 2015-08-21 MED ORDER — CEFUROXIME AXETIL 250 MG/5ML PO SUSR: 500.0000 mg | Freq: Two times a day (BID) | ORAL | Status: DC

## 2015-08-21 NOTE — Telephone Encounter (Signed)
Pt saw Dr Sarajane Jews today. He asking if Dr Yong Channel will except him as a transfer pt from Dr Etter Sjogren

## 2015-08-21 NOTE — Telephone Encounter (Signed)
I can accept him at next available Monday slot at 40 15 or 8 32. If there have been any cancellations- would prefer other patients that have been waiting to moved up. If he wants to be seen sooner- likely Dr. Martinique who is coming in can see him sooner.

## 2015-08-21 NOTE — Progress Notes (Signed)
Pre visit review using our clinic review tool, if applicable. No additional management support is needed unless otherwise documented below in the visit note. 

## 2015-08-21 NOTE — Telephone Encounter (Signed)
See below

## 2015-08-21 NOTE — Progress Notes (Signed)
   Subjective:    Patient ID: Chase [name], male    DOB: 26-Mar-1961, 55 y.o.   MRN: [id number]  HPI Here for one week of sinus pressure, headache, PND, ST, and blowing yellow mucus from the nose. No cough or fever. Using Nasacort sprays.    Review of Systems  Constitutional: Negative.   HENT: Positive for congestion, postnasal drip and sinus pressure. Negative for ear pain and sore throat.   Eyes: Negative.   Respiratory: Negative.        Objective:   Physical Exam  Constitutional: He appears well-developed and well-nourished.  HENT:  Right Ear: External ear normal.  Left Ear: External ear normal.  Nose: Nose normal.  Mouth/Throat: Oropharynx is clear and moist.  Eyes: Conjunctivae are normal.  Neck: No thyromegaly present.  Pulmonary/Chest: Effort normal and breath sounds normal.  Lymphadenopathy:    He has no cervical adenopathy.          Assessment & Plan:  Sinusitis, treat with Keflex. Add Mucinex prn

## 2015-08-22 NOTE — Telephone Encounter (Signed)
Spoke with pt he will stay [place]

## 2015-08-24 ENCOUNTER — Encounter: Payer: BLUE CROSS/BLUE SHIELD | Admitting: Family Medicine

## 2015-08-28 ENCOUNTER — Telehealth: Payer: Self-pay | Admitting: Family Medicine

## 2015-08-28 NOTE — Telephone Encounter (Signed)
LVM inquiring if patient received flu shot  °

## 2015-09-04 ENCOUNTER — Other Ambulatory Visit: Payer: Self-pay | Admitting: Family Medicine

## 2015-10-09 ENCOUNTER — Other Ambulatory Visit: Payer: Self-pay | Admitting: Family Medicine

## 2015-10-25 ENCOUNTER — Other Ambulatory Visit: Payer: Self-pay | Admitting: Family Medicine

## 2015-10-26 NOTE — Telephone Encounter (Signed)
Rx sent to the pharmacy by e-script.//AB/CMA 

## 2015-10-29 NOTE — Telephone Encounter (Signed)
Chase [name] called in regards to elevated blood sugar

## 2015-10-30 ENCOUNTER — Encounter: Payer: Self-pay | Admitting: Family Medicine

## 2015-10-31 MED ORDER — BAYER MICROLET LANCETS MISC: Status: DC

## 2015-10-31 MED ORDER — GLUCOSE BLOOD VI STRP: ORAL_STRIP | Status: DC

## 2015-10-31 MED ORDER — CONTOUR NEXT ONE KIT: 1.0000 | PACK | Freq: Four times a day (QID) | Status: DC

## 2015-10-31 NOTE — Telephone Encounter (Signed)
Prescriptions for the Contour Next One meter, strips and lancets has been sent to the pharmacy by e-script.//AB/CMA   Pt is requesting to have his CPE with someone else.  See MyChart message regarding the note.//AB/CMA   Requesting Hydrocodone-acet 7.5-325mg /24ml solution-Take 20 ml every 6 hours as needed for pain. Last refill:02/14/15;423ml,0 Last OV:02/23/15-Pt needs CPE and fasting labs. UDS:09/02/12-Low risk-Next screen:09/23/13-Due Please advise.//AB/CMA

## 2015-10-31 NOTE — Telephone Encounter (Signed)
Called and [facility] @ 8:00am @ [phone]) asking the pt to RTC regarding the MyChart message.//AB/CMA

## 2015-11-01 DIAGNOSIS — N401 Enlarged prostate with lower urinary tract symptoms: Secondary | ICD-10-CM | POA: Diagnosis not present

## 2015-11-01 NOTE — Telephone Encounter (Signed)
WE DO NOT HAVE THEM BUT CAN CALL IT IN FOR HIM

## 2015-11-01 NOTE — Telephone Encounter (Signed)
We do not have them but we can send it to the pharmacy

## 2015-11-02 ENCOUNTER — Encounter: Payer: Self-pay | Admitting: Family Medicine

## 2015-11-02 NOTE — Telephone Encounter (Signed)
i thought he was seeing endo for his diabetes?

## 2015-11-07 NOTE — Telephone Encounter (Signed)
Pt has not had any visits w/ endo within our system or [facility] recently. No referrals placed to endocrinology per chart review.

## 2015-11-08 NOTE — Telephone Encounter (Signed)
Is he still seeing endo ?  If not he has not had labs done in a year--- we need labs He should be having them every 3-6 months Lipid, cmp, hgba1c, microalbumin, ua

## 2015-11-08 NOTE — Telephone Encounter (Signed)
Contour next supplies sent on 10/31/2015

## 2015-11-08 NOTE — Telephone Encounter (Signed)
Is he still seeing endo?  If not he has not had labs in a year and he should have them every 3-6 months---- we need labs Lipid, cmp, hgba1c , microalbumin

## 2015-11-13 ENCOUNTER — Ambulatory Visit (INDEPENDENT_AMBULATORY_CARE_PROVIDER_SITE_OTHER): Payer: BLUE CROSS/BLUE SHIELD | Admitting: Family Medicine

## 2015-11-13 ENCOUNTER — Encounter: Payer: Self-pay | Admitting: Family Medicine

## 2015-11-13 VITALS — BP 128/78 | HR 73 | Temp 98.7°F | Ht 71.0 in | Wt 196.8 lb

## 2015-11-13 DIAGNOSIS — E1151 Type 2 diabetes mellitus with diabetic peripheral angiopathy without gangrene: Secondary | ICD-10-CM | POA: Diagnosis not present

## 2015-11-13 DIAGNOSIS — F411 Generalized anxiety disorder: Secondary | ICD-10-CM | POA: Diagnosis not present

## 2015-11-13 DIAGNOSIS — L259 Unspecified contact dermatitis, unspecified cause: Secondary | ICD-10-CM | POA: Diagnosis not present

## 2015-11-13 DIAGNOSIS — J302 Other seasonal allergic rhinitis: Secondary | ICD-10-CM

## 2015-11-13 DIAGNOSIS — I10 Essential (primary) hypertension: Secondary | ICD-10-CM

## 2015-11-13 DIAGNOSIS — M25511 Pain in right shoulder: Secondary | ICD-10-CM

## 2015-11-13 DIAGNOSIS — E785 Hyperlipidemia, unspecified: Secondary | ICD-10-CM | POA: Diagnosis not present

## 2015-11-13 DIAGNOSIS — E1165 Type 2 diabetes mellitus with hyperglycemia: Secondary | ICD-10-CM

## 2015-11-13 DIAGNOSIS — IMO0002 Reserved for concepts with insufficient information to code with codable children: Secondary | ICD-10-CM

## 2015-11-13 LAB — LIPID PANEL
Cholesterol: 150 mg/dL (ref 0–200)
HDL: 30.1 mg/dL — ABNORMAL LOW (ref >39.00)
LDL Cholesterol: 93 mg/dL (ref 0–99)
NonHDL: 120.21
Total CHOL/HDL Ratio: 5
Triglycerides: 134 mg/dL (ref 0.0–149.0)
VLDL: 26.8 mg/dL (ref 0.0–40.0)

## 2015-11-13 LAB — MICROALBUMIN / CREATININE URINE RATIO
Creatinine,U: 115.3 mg/dL
Microalb Creat Ratio: 1.1 mg/g (ref 0.0–30.0)
Microalb, Ur: 1.3 mg/dL (ref 0.0–1.9)

## 2015-11-13 LAB — COMPREHENSIVE METABOLIC PANEL
ALT: 23 U/L (ref 0–53)
AST: 18 U/L (ref 0–37)
Albumin: 4.6 g/dL (ref 3.5–5.2)
Alkaline Phosphatase: 79 U/L (ref 39–117)
BUN: 11 mg/dL (ref 6–23)
CO2: 30 mEq/L (ref 19–32)
Calcium: 9.5 mg/dL (ref 8.4–10.5)
Chloride: 102 mEq/L (ref 96–112)
Creatinine, Ser: 1.1 mg/dL (ref 0.40–1.50)
GFR: 73.81 mL/min (ref >60.00)
Glucose, Bld: 190 mg/dL — ABNORMAL HIGH (ref 70–99)
Potassium: 3.4 mEq/L — ABNORMAL LOW (ref 3.5–5.1)
Sodium: 138 mEq/L (ref 135–145)
Total Bilirubin: 0.6 mg/dL (ref 0.2–1.2)
Total Protein: 7.2 g/dL (ref 6.0–8.3)

## 2015-11-13 LAB — POCT URINALYSIS DIPSTICK
Bilirubin, UA: NEGATIVE
Blood, UA: NEGATIVE
Glucose, UA: 500
Ketones, UA: NEGATIVE
Leukocytes, UA: NEGATIVE
Nitrite, UA: NEGATIVE
Protein, UA: NEGATIVE
Spec Grav, UA: 1.02
Urobilinogen, UA: 0.2
pH, UA: 7

## 2015-11-13 LAB — HEMOGLOBIN A1C: Hgb A1c MFr Bld: 9.8 % — ABNORMAL HIGH (ref 4.6–6.5)

## 2015-11-13 MED ORDER — PRAVASTATIN SODIUM 20 MG PO TABS: 20.0000 mg | ORAL_TABLET | Freq: Every day | ORAL | Status: DC

## 2015-11-13 MED ORDER — HYDROCODONE-ACETAMINOPHEN 7.5-325 MG/15ML PO SOLN: ORAL | Status: DC

## 2015-11-13 MED ORDER — SITAGLIPTIN PHOSPHATE 100 MG PO TABS: 100.0000 mg | ORAL_TABLET | Freq: Every day | ORAL | Status: DC

## 2015-11-13 MED ORDER — DIAZEPAM 5 MG PO TABS: 5.0000 mg | ORAL_TABLET | Freq: Three times a day (TID) | ORAL | Status: DC

## 2015-11-13 MED ORDER — LEVOCETIRIZINE DIHYDROCHLORIDE 5 MG PO TABS: 5.0000 mg | ORAL_TABLET | Freq: Every evening | ORAL | Status: DC

## 2015-11-13 MED ORDER — METOPROLOL TARTRATE 25 MG PO TABS: ORAL_TABLET | ORAL | Status: DC

## 2015-11-13 MED ORDER — AMLODIPINE BESYLATE 5 MG PO TABS: 5.0000 mg | ORAL_TABLET | Freq: Every day | ORAL | Status: DC

## 2015-11-13 MED ORDER — MOMETASONE FUROATE 0.1 % EX OINT: 1.0000 "application " | TOPICAL_OINTMENT | Freq: Every day | CUTANEOUS | Status: DC | PRN

## 2015-11-13 NOTE — Progress Notes (Signed)
Patient ID: Chase [name], male    DOB: 02-Feb-1961  Age: 55 y.o. MRN: [id number]    Subjective:  Subjective HPI Chase [name] presents for f/u dm, bp and cholesterol.   He is also c/o cough.  Nonproductive, no fever, no cp or sob.     HYPERTENSION  Blood pressure range-not checking  Chest pain- no      Dyspnea- no Lightheadedness- no   Edema- no Other side effects - no   Medication compliance: good Low salt diet- yes  DIABETES  Blood Sugar ranges-184-240  Polyuria- no New Visual problems- no Hypoglycemic symptoms- no Other side effects-no Medication compliance - good Last eye exam- last year Foot exam- today  HYPERLIPIDEMIA  Medication compliance- good RUQ pain- no  Muscle aches- no Other side effects-no   Review of Systems  Constitutional: Negative for diaphoresis, appetite change, fatigue and unexpected weight change.  Eyes: Negative for pain, redness and visual disturbance.  Respiratory: Negative for cough, chest tightness, shortness of breath and wheezing.   Cardiovascular: Negative for chest pain, palpitations and leg swelling.  Endocrine: Negative for cold intolerance, heat intolerance, polydipsia, polyphagia and polyuria.  Genitourinary: Negative for dysuria, frequency and difficulty urinating.  Neurological: Negative for dizziness, light-headedness, numbness and headaches.    History Past Medical History  Diagnosis Date  . Allergy   . Stroke ([facility])   . Sleep apnea   . Headache(784.0)   . Arthritis   . GERD (gastroesophageal reflux disease)   . Hypertension   . Hyperlipidemia   . Hearing loss   . Nasal congestion   . Trouble swallowing   . Blood in stool   . Chest pain   . Leg swelling   . Constipation   . Rectal pain   . Difficulty urinating   . Thrombosed hemorrhoids   . Brain cyst   . PONV (postoperative nausea and vomiting)   . Diabetes mellitus without complication ([facility])   . Stroke ([facility])   . Rectal ulcer with bleeding after hemorrhoid  banding 02/11/2015    He has past surgical history that includes Cataract extraction; Inner ear surgery; Eye surgery (1968, 1985, 1987); Inner ear surgery (1992); surgery - right arm; and Flexible sigmoidoscopy (N/A, 02/11/2015).   His family history includes Breast cancer (age of onset: 85) in his mother; Heart attack in his father; Heart disease in his father; Hypertension in his brother and father; Stroke in his father. There is no history of Hyperlipidemia or Diabetes.He reports that he has never smoked. He has never used smokeless tobacco. He reports that he does not drink alcohol or use illicit drugs.  Current Outpatient Prescriptions on File Prior to Visit  Medication Sig Dispense Refill  . allopurinol (ZYLOPRIM) 100 MG tablet Take 1 tablet (100 mg total) by mouth daily. 90 tablet 1  . aspirin 81 MG chewable tablet Chew 1 tablet (81 mg total) by mouth daily. 90 tablet 3  . Azelastine HCl 0.15 % SOLN PLACE 2 SPRAYS IN EACH NOSTRIL every night 30 mL 11  . BAYER MICROLET LANCETS lancets CHECK BLOOD SUGAR FOUR TIMES PER DAY.  DX:E11.9 100 each 12  . Blood Glucose Monitoring Suppl (CONTOUR NEXT ONE) KIT 1 Device by Does not apply route 4 (four) times daily. CHECK BLOOD SUGAR FOUR TIMES PER DAY.  DX:E11.9 1 kit 0  . cyclobenzaprine (FLEXERIL) 10 MG tablet Take 1 tablet (10 mg total) by mouth at bedtime. 30 tablet 3  . FIBER SELECT GUMMIES PO Take 1 tablet  by mouth daily.    Marland Kitchen glucose blood (BAYER CONTOUR NEXT TEST) test strip CHECK BLOOD SUGAR FOUR TIMES PER DAY.  DX:11.9 100 each 12  . hydrocortisone (ANUSOL-HC) 25 MG suppository Place 1 suppository (25 mg total) rectally 2 (two) times daily. 20 suppository 2  . hydrOXYzine (ATARAX/VISTARIL) 25 MG tablet TAKE 1 TABLET (25 MG TOTAL) BY MOUTH EVERY 8 (EIGHT) HOURS AS NEEDED FOR ANXIETY OR ITCHING. 90 tablet 0  . meclizine (ANTIVERT) 25 MG tablet Take 1 tablet (25 mg total) by mouth 3 (three) times daily as needed. (Patient taking differently: Take  25 mg by mouth 3 (three) times daily as needed for dizziness. ) 60 tablet 0  . polyethylene glycol powder (GLYCOLAX/MIRALAX) powder TAKE 17 GRAMS BY MOUTH 2 (TWO) TIMES DAILY AS NEEDED. 527 g 5  . ranitidine (ZANTAC) 75 MG/5ML syrup TAKE 10 MLS BY MOUTH 2 TIMES DAILY AS NEEDED. FOR INDIGESTION. 300 mL 0  . venlafaxine XR (EFFEXOR-XR) 37.5 MG 24 hr capsule TAKE 1 CAPSULE (37.5 MG TOTAL) BY MOUTH DAILY. 30 capsule 5   No current facility-administered medications on file prior to visit.     Objective:  Objective Physical Exam  Constitutional: He is oriented to person, place, and time. Vital signs are normal. He appears well-developed and well-nourished. He is sleeping.  HENT:  Head: Normocephalic and atraumatic.  Mouth/Throat: Oropharynx is clear and moist.  Eyes: EOM are normal. Pupils are equal, round, and reactive to light.  Neck: Normal range of motion. Neck supple. No thyromegaly present.  Cardiovascular: Normal rate and regular rhythm.   No murmur heard. Pulmonary/Chest: Effort normal and breath sounds normal. No respiratory distress. He has no wheezes. He has no rales. He exhibits no tenderness.  Musculoskeletal: He exhibits no edema or tenderness.  Neurological: He is alert and oriented to person, place, and time.  Skin: Skin is warm and dry.  Psychiatric: He has a normal mood and affect. His behavior is normal. Judgment and thought content normal.  Nursing note and vitals reviewed. Sensory exam of the foot is normal, tested with the monofilament. Good pulses, no lesions or ulcers, good peripheral pulses.  BP 128/78 mmHg  Pulse 73  Temp(Src) 98.7 F (37.1 C) (Oral)  Ht 5\' 11"  (1.803 m)  Wt 196 lb 12.8 oz (89.268 kg)  BMI 27.46 kg/m2  SpO2 98% Wt Readings from Last 3 Encounters:  11/13/15 196 lb 12.8 oz (89.268 kg)  08/21/15 195 lb (88.451 kg)  04/25/15 195 lb (88.451 kg)     Lab Results  Component Value Date   WBC 11.3* 02/23/2015   HGB 11.3* 02/23/2015   HCT 34.5*  02/23/2015   PLT 491.0* 02/23/2015   GLUCOSE 190* 11/13/2015   CHOL 150 11/13/2015   TRIG 134.0 11/13/2015   HDL 30.10* 11/13/2015   LDLDIRECT 148.2 06/03/2013   LDLCALC 93 11/13/2015   ALT 23 11/13/2015   AST 18 11/13/2015   NA 138 11/13/2015   K 3.4* 11/13/2015   CL 102 11/13/2015   CREATININE 1.10 11/13/2015   BUN 11 11/13/2015   CO2 30 11/13/2015   TSH 2.314 02/11/2015   PSA 1.01 10/03/2014   INR 1.11 02/10/2015   HGBA1C 9.8* 11/13/2015   MICROALBUR 1.3 11/13/2015    11/15/2015 Venous Img Upper Uni Right  02/14/2015  CLINICAL DATA:  Right arm and neck pain, recent hospitalization EXAM: Right UPPER EXTREMITY VENOUS DOPPLER ULTRASOUND TECHNIQUE: Gray-scale sonography with graded compression, as well as color Doppler and duplex ultrasound were performed  to evaluate the upper extremity deep venous system from the level of the subclavian vein and including the jugular, axillary, basilic, radial, ulnar and upper cephalic vein. Spectral Doppler was utilized to evaluate flow at rest and with distal augmentation maneuvers. COMPARISON:  None. FINDINGS: Contralateral Subclavian Vein: Respiratory phasicity is normal and symmetric with the symptomatic side. No evidence of thrombus. Normal compressibility. Internal Jugular Vein: No evidence of thrombus. Normal compressibility, respiratory phasicity and response to augmentation. Subclavian Vein: No evidence of thrombus. Normal compressibility, respiratory phasicity and response to augmentation. Axillary Vein: No evidence of thrombus. Normal compressibility, respiratory phasicity and response to augmentation. Cephalic Vein: No evidence of thrombus. Normal compressibility, respiratory phasicity and response to augmentation. Basilic Vein: No evidence of thrombus. Normal compressibility, respiratory phasicity and response to augmentation. Brachial Veins: No evidence of thrombus. Normal compressibility, respiratory phasicity and response to augmentation. Radial  Veins: No evidence of thrombus. Normal compressibility, respiratory phasicity and response to augmentation. Ulnar Veins: No evidence of thrombus. Normal compressibility, respiratory phasicity and response to augmentation. Venous Reflux:  None visualized. Other Findings:  None visualized. IMPRESSION: No evidence of deep venous thrombosis. Electronically Signed   By: Conchita Paris M.D.   On: 02/14/2015 16:30     Assessment & Plan:  Plan I have discontinued Mr. Matheny hydrocortisone-pramoxine, oxybutynin, cetirizine, and cephALEXin. I am also having him start on levocetirizine and sitaGLIPtin. Additionally, I am having him maintain his [place] PO, meclizine, allopurinol, aspirin, hydrOXYzine, cyclobenzaprine, hydrocortisone, ranitidine, Azelastine HCl, venlafaxine XR, polyethylene glycol powder, CONTOUR NEXT ONE, glucose blood, BAYER MICROLET LANCETS, doxycycline, mometasone, diazepam, HYDROcodone-acetaminophen, pravastatin, metoprolol tartrate, and amLODipine.  Meds ordered this encounter  Medications  . doxycycline (VIBRA-TABS) 100 MG tablet    Sig: Take 1 tablet by mouth 2 (two) times daily. For 10 days    Refill:  0  . DISCONTD: HYDROcodone-acetaminophen (HYCET) 7.5-325 mg/15 ml solution    Sig: TAKE 20 ML EVERY 6 HOURS AS NEEDED FOR PAIN    Dispense:  473 mL    Refill:  0  . DISCONTD: diazepam (VALIUM) 5 MG tablet    Sig: Take 1 tablet (5 mg total) by mouth 3 (three) times daily.    Dispense:  90 tablet    Refill:  0    Not to exceed 5 additional fills before 10/03/2015  . mometasone (ELOCON) 0.1 % ointment    Sig: Apply 1 application topically daily as needed (for rash).    Dispense:  45 g    Refill:  2  . levocetirizine (XYZAL) 5 MG tablet    Sig: Take 1 tablet (5 mg total) by mouth every evening.    Dispense:  30 tablet    Refill:  5  . sitaGLIPtin (JANUVIA) 100 MG tablet    Sig: Take 1 tablet (100 mg total) by mouth daily.    Dispense:  30 tablet    Refill:  2  .  diazepam (VALIUM) 5 MG tablet    Sig: Take 1 tablet (5 mg total) by mouth 3 (three) times daily.    Dispense:  90 tablet    Refill:  0    Not to exceed 5 additional fills before 10/03/2015  . HYDROcodone-acetaminophen (HYCET) 7.5-325 mg/15 ml solution    Sig: TAKE 20 ML EVERY 6 HOURS AS NEEDED FOR PAIN    Dispense:  473 mL    Refill:  0  . pravastatin (PRAVACHOL) 20 MG tablet    Sig: Take 1 tablet (20 mg total)  by mouth daily.    Dispense:  90 tablet    Refill:  3  . metoprolol tartrate (LOPRESSOR) 25 MG tablet    Sig: TAKE 1 TABLET TWICE A DAY, MAY CRUSH IN FOOD    Dispense:  180 tablet    Refill:  0  . amLODipine (NORVASC) 5 MG tablet    Sig: Take 1 tablet (5 mg total) by mouth daily. Office visit due now    Dispense:  90 tablet    Refill:  0    Problem List Items Addressed This Visit      Unprioritized   Essential hypertension    Stable con't meds      Relevant Medications   pravastatin (PRAVACHOL) 20 MG tablet   metoprolol tartrate (LOPRESSOR) 25 MG tablet   amLODipine (NORVASC) 5 MG tablet   Other Relevant Orders   Comprehensive metabolic panel (Completed)   Hemoglobin A1c (Completed)   Lipid panel (Completed)   POCT urinalysis dipstick (Completed)   Microalbumin / creatinine urine ratio (Completed)   Hyperlipidemia   Relevant Medications   pravastatin (PRAVACHOL) 20 MG tablet   metoprolol tartrate (LOPRESSOR) 25 MG tablet   amLODipine (NORVASC) 5 MG tablet   Other Relevant Orders   Comprehensive metabolic panel (Completed)   Hemoglobin A1c (Completed)   Lipid panel (Completed)   POCT urinalysis dipstick (Completed)   Microalbumin / creatinine urine ratio (Completed)    Other Visit Diagnoses    DM (diabetes mellitus) type II uncontrolled, periph vascular disorder ([facility]CC)    -  Primary    Relevant Medications    sitaGLIPtin (JANUVIA) 100 MG tablet    pravastatin (PRAVACHOL) 20 MG tablet    metoprolol tartrate (LOPRESSOR) 25 MG tablet    amLODipine  (NORVASC) 5 MG tablet    Other Relevant Orders    Comprehensive metabolic panel (Completed)    Hemoglobin A1c (Completed)    Lipid panel (Completed)    POCT urinalysis dipstick (Completed)    Microalbumin / creatinine urine ratio (Completed)    Ambulatory referral to Endocrinology    Pain in joint, shoulder region, right        Relevant Medications    HYDROcodone-acetaminophen (HYCET) 7.5-325 mg/15 ml solution    Contact dermatitis        Relevant Medications    mometasone (ELOCON) 0.1 % ointment    Generalized anxiety disorder        Relevant Medications    diazepam (VALIUM) 5 MG tablet    Seasonal allergies        Relevant Medications    levocetirizine (XYZAL) 5 MG tablet       Follow-up: Return in about 3 months (around 02/13/2016), or if symptoms worsen or fail to improve, for hypertension, hyperlipidemia, diabetes II, fasting.  Ann Held, DO

## 2015-11-13 NOTE — Progress Notes (Signed)
Pre visit review using our clinic review tool, if applicable. No additional management support is needed unless otherwise documented below in the visit note. 

## 2015-11-13 NOTE — Assessment & Plan Note (Signed)
Uncontrolled Pt has not been to endo--- he said he called and the number was to neuro [facility] [place]

## 2015-11-13 NOTE — Patient Instructions (Signed)

## 2015-11-13 NOTE — Assessment & Plan Note (Signed)
Stable con't meds 

## 2015-11-14 ENCOUNTER — Encounter: Payer: Self-pay | Admitting: Family Medicine

## 2015-11-14 DIAGNOSIS — N401 Enlarged prostate with lower urinary tract symptoms: Secondary | ICD-10-CM | POA: Diagnosis not present

## 2015-11-14 DIAGNOSIS — N138 Other obstructive and reflux uropathy: Secondary | ICD-10-CM | POA: Diagnosis not present

## 2015-11-15 ENCOUNTER — Other Ambulatory Visit: Payer: Self-pay | Admitting: Family Medicine

## 2015-12-14 DIAGNOSIS — N401 Enlarged prostate with lower urinary tract symptoms: Secondary | ICD-10-CM | POA: Diagnosis not present

## 2015-12-14 DIAGNOSIS — N138 Other obstructive and reflux uropathy: Secondary | ICD-10-CM | POA: Diagnosis not present

## 2015-12-20 ENCOUNTER — Telehealth: Payer: Self-pay | Admitting: Family Medicine

## 2015-12-20 ENCOUNTER — Encounter: Payer: Self-pay | Admitting: Adult Health

## 2015-12-20 ENCOUNTER — Ambulatory Visit (INDEPENDENT_AMBULATORY_CARE_PROVIDER_SITE_OTHER): Payer: BLUE CROSS/BLUE SHIELD | Admitting: Adult Health

## 2015-12-20 VITALS — BP 124/80 | Temp 98.1°F | Wt 195.4 lb

## 2015-12-20 DIAGNOSIS — L259 Unspecified contact dermatitis, unspecified cause: Secondary | ICD-10-CM | POA: Diagnosis not present

## 2015-12-20 MED ORDER — METHYLPREDNISOLONE ACETATE 80 MG/ML IJ SUSP
120.0000 mg | Freq: Once | INTRAMUSCULAR | Status: AC
  Administered 2015-12-20: 120 mg via INTRAMUSCULAR

## 2015-12-20 NOTE — Progress Notes (Signed)
   Subjective:    Patient ID: Chase [name], male    DOB: 31-Dec-1960, 55 y.o.   MRN: [id number]  HPI  55 year old male who presents to the office today for an acute issue of red, painful [name] on bilateral hands and under his chin. He first started noticing the discomfort 3-4 days ago. He reports that the [name] is itchy and painful " feels like I am stuck in a cactus".   He thought that this [name] was originally from taking Septra.   He does endorse being in the woods a day or two prior to the [name]. Does not remember if he came in contact with anything   Review of Systems  Constitutional: Negative.   Respiratory: Negative.   Skin: Positive for [name]. Negative for color change.  All other systems reviewed and are negative.      Objective:   Physical Exam  Constitutional: He is oriented to person, place, and time. He appears well-developed and well-nourished. No distress.  Cardiovascular: Normal rate, regular rhythm, normal heart sounds and intact distal pulses.  Exam reveals no gallop.   No murmur heard. Pulmonary/Chest: Effort normal and breath sounds normal. No respiratory distress. He has no wheezes. He has no rales. He exhibits no tenderness.  Neurological: He is alert and oriented to person, place, and time. He displays normal reflexes. No cranial nerve deficit. He exhibits normal muscle tone.  Skin: Skin is warm, dry and intact. [name] noted. He is not diaphoretic. No erythema. No pallor.     Psychiatric: He has a normal mood and affect. His behavior is normal. Judgment and thought content normal.  Nursing note and vitals reviewed.     Assessment & Plan:  1. Contact dermatitis - Appears from a plant not from a drug induced [name] - methylPREDNISolone acetate (DEPO-MEDROL) injection 120 mg; Inject 1.5 mLs (120 mg total) into the muscle once. - He is diabetic and blood sugars are not controlled. His log shows between 150-190. I feel that benefit of steroid shot outweighs the risk at  this point - Advised to drink plenty of extra water.  - Can use OTC hydrocortisone cream if needed - Follow up with PCP if no improvement   Dorothyann Peng, NP

## 2015-12-20 NOTE — Patient Instructions (Signed)
It was great meeting you today  Your exam is consistent with a rash from a plant allergy  I have given you a prednisone shot. Please drink plenty of water as this can increase your blood sugars over the next few days  Follow up with your PCP if no improvement  You can continue to use Atarax or benadryl to help with the itching.   You can also use an over the counter hydrocortisone cream

## 2015-12-20 NOTE — Telephone Encounter (Signed)
Pt says that he started taking medication Sulfamethoxazole and stopped. Pt isn't sure if that could be the cause of rash. Scheduled pt an appt at our [place] office.

## 2015-12-23 ENCOUNTER — Other Ambulatory Visit: Payer: Self-pay | Admitting: Family Medicine

## 2015-12-24 NOTE — Telephone Encounter (Signed)
Last seen and filled  11/13/15 #90  Please advise     KP

## 2016-01-02 ENCOUNTER — Encounter: Payer: Self-pay | Admitting: Internal Medicine

## 2016-01-02 ENCOUNTER — Ambulatory Visit (INDEPENDENT_AMBULATORY_CARE_PROVIDER_SITE_OTHER): Payer: BLUE CROSS/BLUE SHIELD | Admitting: Internal Medicine

## 2016-01-02 VITALS — BP 124/74 | HR 83 | Ht 69.5 in | Wt 193.0 lb

## 2016-01-02 DIAGNOSIS — E1165 Type 2 diabetes mellitus with hyperglycemia: Secondary | ICD-10-CM | POA: Diagnosis not present

## 2016-01-02 DIAGNOSIS — E1151 Type 2 diabetes mellitus with diabetic peripheral angiopathy without gangrene: Secondary | ICD-10-CM

## 2016-01-02 DIAGNOSIS — E1159 Type 2 diabetes mellitus with other circulatory complications: Secondary | ICD-10-CM | POA: Diagnosis not present

## 2016-01-02 DIAGNOSIS — IMO0002 Reserved for concepts with insufficient information to code with codable children: Secondary | ICD-10-CM | POA: Insufficient documentation

## 2016-01-02 HISTORY — DX: Reserved for concepts with insufficient information to code with codable children: IMO0002

## 2016-01-02 HISTORY — DX: Type 2 diabetes mellitus with diabetic peripheral angiopathy without gangrene: E11.51

## 2016-01-02 MED ORDER — CANAGLIFLOZIN 100 MG PO TABS: 100.0000 mg | ORAL_TABLET | Freq: Every day | ORAL | Status: DC

## 2016-01-02 NOTE — Patient Instructions (Addendum)
Please continue: - Januvia 100 mg daily, but move this before b'fast  Please start: - Invokana 100 mg before b'fast.  Please return in 1.5 months with your sugar log.   PATIENT INSTRUCTIONS FOR TYPE 2 DIABETES:  DIET AND EXERCISE Diet and exercise is an important part of diabetic treatment.  We recommended aerobic exercise in the form of brisk walking (working between 40-60% of maximal aerobic capacity, similar to brisk walking) for 150 minutes per week (such as 30 minutes five days per week) along with 3 times per week performing 'resistance' training (using various gauge rubber tubes with handles) 5-10 exercises involving the major muscle groups (upper body, lower body and core) performing 10-15 repetitions (or near fatigue) each exercise. Start at half the above goal but build slowly to reach the above goals. If limited by weight, joint pain, or disability, we recommend daily walking in a swimming pool with water up to waist to reduce pressure from joints while allow for adequate exercise.    BLOOD GLUCOSES Monitoring your blood glucoses is important for continued management of your diabetes. Please check your blood glucoses 2-4 times a day: fasting, before meals and at bedtime (you can rotate these measurements - e.g. one day check before the 3 meals, the next day check before 2 of the meals and before bedtime, etc.).   HYPOGLYCEMIA (low blood sugar) Hypoglycemia is usually a reaction to not eating, exercising, or taking too much insulin/ other diabetes drugs.  Symptoms include tremors, sweating, hunger, confusion, headache, etc. Treat IMMEDIATELY with 15 grams of Carbs: . 4 glucose tablets .  cup regular juice/soda . 2 tablespoons raisins . 4 teaspoons sugar . 1 tablespoon honey Recheck blood glucose in 15 mins and repeat above if still symptomatic/blood glucose <100.  RECOMMENDATIONS TO REDUCE YOUR RISK OF DIABETIC COMPLICATIONS: * Take your prescribed MEDICATION(S) * Follow a  DIABETIC diet: Complex carbs, fiber rich foods, (monounsaturated and polyunsaturated) fats * AVOID saturated/trans fats, high fat foods, >2,300 mg salt per day. * EXERCISE at least 5 times a week for 30 minutes or preferably daily.  * DO NOT SMOKE OR DRINK more than 1 drink a day. * Check your FEET every day. Do not wear tightfitting shoes. Contact us if you develop an ulcer * See your EYE doctor once a year or more if needed * Get a FLU shot once a year * Get a PNEUMONIA vaccine once before and once after age 15 years  GOALS:  * Your Hemoglobin A1c of <7%  * fasting sugars need to be <130 * after meals sugars need to be <180 (2h after you start eating) * Your Systolic BP should be [id number] or lower  * Your Diastolic BP should be 80 or lower  * Your HDL (Good Cholesterol) should be 40 or higher  * Your LDL (Bad Cholesterol) should be 100 or lower. * Your Triglycerides should be 150 or lower  * Your Urine microalbumin (kidney function) should be <30 * Your Body Mass Index should be 25 or lower    Please consider the following ways to cut down carbs and fat and increase fiber and micronutrients in your diet: - substitute whole grain for white bread or pasta - substitute brown rice for white rice - substitute 90-calorie flat bread pieces for slices of bread when possible - substitute sweet potatoes or yams for white potatoes - substitute humus for margarine - substitute tofu for cheese when possible - substitute almond or rice milk for regular  milk (would not drink soy milk daily due to concern for soy estrogen influence on breast cancer risk) - substitute dark chocolate for other sweets when possible - substitute water - can add lemon or orange slices for taste - for diet sodas (artificial sweeteners will trick your body that you can eat sweets without getting calories and will lead you to overeating and weight gain in the long run) - do not skip breakfast or other meals (this will slow down  the metabolism and will result in more weight gain over time)  - can try smoothies made from fruit and almond/rice milk in am instead of regular breakfast - can also try old-fashioned (not instant) oatmeal made with almond/rice milk in am - order the dressing on the side when eating salad at a restaurant (pour less than half of the dressing on the salad) - eat as little meat as possible - can try juicing, but should not forget that juicing will get rid of the fiber, so would alternate with eating raw veg./fruits or drinking smoothies - use as little oil as possible, even when using olive oil - can dress a salad with a mix of balsamic vinegar and lemon juice, for e.g. - use agave nectar, stevia sugar, or regular sugar rather than artificial sweateners - steam or broil/roast veggies  - snack on veggies/fruit/nuts (unsalted, preferably) when possible, rather than processed foods - reduce or eliminate aspartame in diet (it is in diet sodas, chewing gum, etc) Read the labels!  Try to read Dr. Janene Harvey book: "Program for Reversing Diabetes" for other ideas for healthy eating.

## 2016-01-02 NOTE — Progress Notes (Signed)
Patient ID: Chase [name], male   DOB: 1960-06-24, 55 y.o.   MRN: [id number]  HPI: Chase [name] is a 55 y.o.-year-old male, referred by his PCP, Dr. Etter Sjogren, for management of DM2, dx in 2014, non-insulin-dependent, uncontrolled, with complications (PVD, cerebro-vascular ds - s/p "mini"strokes, PN). I saw pt before >3 years ago.  He describes that last year he was hospitalized with LGI bleeding.   Last hemoglobin A1c was: Lab Results  Component Value Date   HGBA1C 9.8* 11/13/2015   HGBA1C 6.8* 02/11/2015   HGBA1C 6.8* 10/03/2014   Pt is on a regimen of: - Januvia 100 mg daily at bedtime - started 2 mo ago, previously diet-control DM He was on Metformin 500 mg po bid - in liquid form, as he could not swallow pills. He gets gi upset. He stopped this in 2014.  Pt checks his sugars 2-4 a day and they are: - am: 153-214, 294 - 2h after b'fast: n/c - before lunch: 140-159 - 2h after lunch: n/c - before dinner: 130 - 2h after dinner: n/c - bedtime: 172-214 - nighttime: n/c No lows. Lowest sugar was 130; he has hypoglycemia awareness at 70.  Highest sugar was 294.  Glucometer: [employer] Next  Pt's meals are: - Breakfast: cereal - Honey nut bunches of oats - Lunch: Cereal or [place] burger or spaghetti, chicken salad sandwich  - Dinner: [place] gyro, grilled chicken sandwich or salad, chicken nuggets, dessert (ice cream) Snacks: 2-5 including fruit  - + mild CKD, last BUN/creatinine:  Lab Results  Component Value Date   BUN 11 11/13/2015   BUN 21 02/23/2015   CREATININE 1.10 11/13/2015   CREATININE 1.06 02/23/2015   - last set of lipids: Lab Results  Component Value Date   CHOL 150 11/13/2015   HDL 30.10* 11/13/2015   LDLCALC 93 11/13/2015   LDLDIRECT 148.2 06/03/2013   TRIG 134.0 11/13/2015   CHOLHDL 5 11/13/2015   - last eye exam was in 2016. No DR.  - + numbness and tingling in his feet: PN  Pt has ? FH of DM.  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 He has anxiety, PTSD from childhood. Has varicose veins >> wears compresion hoses. He also has a h/o kidney stones.   ROS: Constitutional: + weight gain, + fatigue, + hot flushes, + poor sleep, + dysuria (sees urology) Eyes: + blurry vision, no xerophthalmia ENT: + sore throat, no nodules palpated in throat, + dysphagia/no odynophagia, + hoarseness, + tinnitus, + hypoacusis Cardiovascular: no CP/+ SOB/+ palpitations/+ leg swelling Respiratory: + cough/+ SOB Gastrointestinal: + N/no V/+ D/+ C, + heartburn Musculoskeletal: + muscle aches/+ joint aches Skin: + rash, + itching Neurological: no tremors/numbness/tingling/dizziness, + HA Psychiatric: + both: depression/anxiety  Past Medical History  Diagnosis Date  . Allergy   . Stroke ([place])   . Sleep apnea   . Headache(784.0)   . Arthritis   . GERD (gastroesophageal reflux disease)   . Hypertension   . Hyperlipidemia   . Hearing loss   . Nasal congestion   . Trouble swallowing   . Blood in stool   . Chest pain   . Leg swelling   . Constipation   . Rectal pain   . Difficulty urinating   . Thrombosed hemorrhoids   . Brain cyst   .  PONV (postoperative nausea and vomiting)   . Diabetes mellitus without complication ([place])   . Stroke ([place])   . Rectal ulcer with bleeding after hemorrhoid banding 02/11/2015   Past Surgical History  Procedure Laterality Date  . Cataract extraction      x 3  . Inner ear surgery      rt ear  . Eye surgery  1968, 1985, 1987    cataracts  . Inner ear surgery  1992  . Surgery - right arm      1983  . Flexible sigmoidoscopy N/A 02/11/2015    Procedure: FLEXIBLE SIGMOIDOSCOPY;  Surgeon: Gatha Mayer, MD;  Location: [facility];  Service: Endoscopy;  Laterality: N/A;   Social History   Social History  . Marital Status: Single     Spouse Name: N/A  . Number of Children: 0  . Years of Education: N/A   Occupational History  . Not on file.   Social History Main Topics  . Smoking status: Never Smoker   . Smokeless tobacco: Never Used  . Alcohol Use: No     Comment: none  . Drug Use: No  . Sexual Activity: No   Other Topics Concern  . Not on file   Social History Narrative   No caffeine intake except for chocolate.   Current Outpatient Prescriptions on File Prior to Visit  Medication Sig Dispense Refill  . allopurinol (ZYLOPRIM) 100 MG tablet Take 1 tablet (100 mg total) by mouth daily. 90 tablet 1  . amLODipine (NORVASC) 5 MG tablet Take 1 tablet (5 mg total) by mouth daily. Office visit due now 90 tablet 0  . Azelastine HCl 0.15 % SOLN PLACE 2 SPRAYS IN EACH NOSTRIL every night 30 mL 11  . BAYER MICROLET LANCETS lancets CHECK BLOOD SUGAR FOUR TIMES PER DAY.  DX:E11.9 100 each 12  . Blood Glucose Monitoring Suppl (CONTOUR NEXT ONE) KIT 1 Device by Does not apply route 4 (four) times daily. CHECK BLOOD SUGAR FOUR TIMES PER DAY.  DX:E11.9 1 kit 0  . cyclobenzaprine (FLEXERIL) 10 MG tablet Take 1 tablet (10 mg total) by mouth at bedtime. 30 tablet 3  . diazepam (VALIUM) 5 MG tablet TAKE 1 TABLET THREE TIMES A DAY 90 tablet 0  . FIBER SELECT GUMMIES PO Take 1 tablet by mouth daily.    Marland Kitchen glucose blood (BAYER CONTOUR NEXT TEST) test strip CHECK BLOOD SUGAR FOUR TIMES PER DAY.  DX:11.9 100 each 12  . HYDROcodone-acetaminophen (HYCET) 7.5-325 mg/15 ml solution TAKE 20 ML EVERY 6 HOURS AS NEEDED FOR PAIN 473 mL 0  . hydrocortisone (ANUSOL-HC) 25 MG suppository Place 1 suppository (25 mg total) rectally 2 (two) times daily. 20 suppository 2  . hydrOXYzine (ATARAX/VISTARIL) 25 MG tablet TAKE 1 TABLET (25 MG TOTAL) BY MOUTH EVERY 8 (EIGHT) HOURS AS NEEDED FOR ANXIETY OR ITCHING. 90 tablet 0  . meclizine (ANTIVERT) 25 MG tablet TAKE 1 TABLET (25 MG TOTAL) BY MOUTH 3 (THREE) TIMES DAILY AS NEEDED. 60 tablet 0  . metoprolol  tartrate (LOPRESSOR) 25 MG tablet TAKE 1 TABLET TWICE A DAY, MAY CRUSH IN FOOD 180 tablet 0  . mometasone (ELOCON) 0.1 % ointment Apply 1 application topically daily as needed (for rash). 45 g 2  . polyethylene glycol powder (GLYCOLAX/MIRALAX) powder TAKE 17 GRAMS BY MOUTH 2 (TWO) TIMES DAILY AS NEEDED. 527 g 5  . pravastatin (PRAVACHOL) 20 MG tablet Take 1 tablet (20 mg total) by mouth daily. 90 tablet 3  .  ranitidine (ZANTAC) 75 MG/5ML syrup TAKE 10 MLS BY MOUTH 2 TIMES DAILY AS NEEDED. FOR INDIGESTION. 300 mL 0  . sitaGLIPtin (JANUVIA) 100 MG tablet Take 1 tablet (100 mg total) by mouth daily. 30 tablet 2  . venlafaxine XR (EFFEXOR-XR) 37.5 MG 24 hr capsule TAKE 1 CAPSULE (37.5 MG TOTAL) BY MOUTH DAILY. 30 capsule 5  . aspirin 81 MG chewable tablet Chew 1 tablet (81 mg total) by mouth daily. (Patient not taking: Reported on 01/02/2016) 90 tablet 3  . levocetirizine (XYZAL) 5 MG tablet Take 1 tablet (5 mg total) by mouth every evening. (Patient not taking: Reported on 01/02/2016) 30 tablet 5   No current facility-administered medications on file prior to visit.   Allergies  Allergen Reactions  . Amoxicillin Hives, Itching and Other (See Comments)    White tongue; ? hives  . Flonase [Fluticasone Propionate]     Nasal Sores  . Terfenadine     ? reaction   Family History  Problem Relation Age of Onset  . Breast cancer Mother 5    breast  . Stroke Father   . Heart disease Father   . Hypertension Father   . Heart attack Father   . Hypertension Brother   . Hyperlipidemia Neg Hx   . Diabetes Neg Hx    PE: BP 124/74 mmHg  Pulse 83  Ht 5' 9.5" (1.765 m)  Wt 193 lb (87.544 kg)  BMI 28.10 kg/m2  SpO2 97% Wt Readings from Last 3 Encounters:  01/02/16 193 lb (87.544 kg)  12/20/15 195 lb 6.4 oz (88.633 kg)  11/13/15 196 lb 12.8 oz (89.268 kg)   Constitutional: overweight, in NAD Eyes: Surgical pupil R, EOMI, no exophthalmos ENT: moist mucous membranes, no thyromegaly, no cervical  lymphadenopathy Cardiovascular: RRR, No MRG Respiratory: CTA B Gastrointestinal: abdomen soft, NT, ND, BS+ Musculoskeletal: no deformities, strength intact in all 4 Skin: moist, warm, + rash - several parts of his body: red, macular  Neurological: no tremor with outstretched hands, DTR normal in all 4  ASSESSMENT: 1. DM2, non-insulin-dependent, uncontrolled, with complications - PVD - cerebro-vascular ds - s/p "mini"strokes - PN  PLAN:  1. Patient with 3 years of previously controlled diabetes, with worsening control in last year and an increase of HbA1c by 3%, while off DM meds. He had GI sxs with Metformin >> stopped 3 years ago and was able to control his DM without medicines. He is not sure why his HbA1c was so high recently.  - reviewing his meter download >> sugars higher at bedtime and subsequently in am (goes to bed late, snacks at night).  Will try to add Invokana. - we discussed about SEs of Invokana, which are: dizziness (advised to be careful when stands from sitting position), decreased BP - usually not < normal (BP today is not low), and fungal UTIs (advised to let me know if develops one).  - given discount card for Invokana - I suggested to:  Patient Instructions  Please continue: - Januvia 100 mg daily, but move this before b'fast  Please start: - Invokana 100 mg before b'fast.  Please return in 1.5 months with your sugar log.   - Strongly advised him to start checking sugars at different times of the day - check 2 times a day, rotating checks - given sugar log and advised how to fill it and to bring it at next appt  - given foot care handout and explained the principles  - given instructions for hypoglycemia  management "15-15 rule"  - advised for yearly eye exams >> he needs one - Return to clinic in 1.5 mo with sugar log   Philemon Kingdom, MD PhD [facility] Endocrinology

## 2016-01-03 ENCOUNTER — Other Ambulatory Visit: Payer: Self-pay | Admitting: Family Medicine

## 2016-01-05 ENCOUNTER — Other Ambulatory Visit: Payer: Self-pay | Admitting: Family Medicine

## 2016-01-09 ENCOUNTER — Other Ambulatory Visit: Payer: Self-pay | Admitting: Family Medicine

## 2016-01-14 ENCOUNTER — Telehealth: Payer: Self-pay

## 2016-01-14 DIAGNOSIS — K644 Residual hemorrhoidal skin tags: Secondary | ICD-10-CM | POA: Diagnosis not present

## 2016-01-14 NOTE — Telephone Encounter (Signed)
Spoke with [facility] who states patients medication Januvia 100 mg has been approved until 01/08/2017. [facility] notified. RX went through.

## 2016-01-21 DIAGNOSIS — N401 Enlarged prostate with lower urinary tract symptoms: Secondary | ICD-10-CM | POA: Diagnosis not present

## 2016-01-21 DIAGNOSIS — N138 Other obstructive and reflux uropathy: Secondary | ICD-10-CM | POA: Diagnosis not present

## 2016-01-22 DIAGNOSIS — G4733 Obstructive sleep apnea (adult) (pediatric): Secondary | ICD-10-CM | POA: Diagnosis not present

## 2016-01-25 ENCOUNTER — Telehealth: Payer: Self-pay | Admitting: Family Medicine

## 2016-01-25 NOTE — Telephone Encounter (Signed)
Spoke with the pharmacist and advised the patient is seeing the specialist and per her note on 01/02/16  Patient Instructions  Please continue: - Januvia 100 mg daily, but move this before b'fast  Please start: - Invokana 100 mg before b'fast.  Please return in 1.5 months with your sugar log.  He verbalized understanding and said the medications were similar and he wanted to be sure.--   FYI      KP

## 2016-01-25 NOTE — Telephone Encounter (Signed)
°  Pharmacy: [id number] - , [address] [place] [phone] (Phone) [phone] (Fax)    Reason for call: Pharmacy called in ref to patient having Rx for both Invokana and Januvia. Pharmacist spoke with KP

## 2016-01-25 NOTE — Telephone Encounter (Signed)
They can call endo if they have any questions

## 2016-01-30 DIAGNOSIS — N411 Chronic prostatitis: Secondary | ICD-10-CM | POA: Diagnosis not present

## 2016-01-30 DIAGNOSIS — Z6827 Body mass index (BMI) 27.0-27.9, adult: Secondary | ICD-10-CM | POA: Diagnosis not present

## 2016-02-01 ENCOUNTER — Telehealth: Payer: Self-pay | Admitting: Family Medicine

## 2016-02-01 ENCOUNTER — Telehealth: Payer: Self-pay

## 2016-02-01 ENCOUNTER — Other Ambulatory Visit: Payer: Self-pay

## 2016-02-01 MED ORDER — DAPAGLIFLOZIN PROPANEDIOL 5 MG PO TABS: 5.0000 mg | ORAL_TABLET | Freq: Every day | ORAL | 1 refills | Status: DC

## 2016-02-01 NOTE — Telephone Encounter (Signed)
Probably not. Let's see if it resolves over the weekend. Let us know.

## 2016-02-01 NOTE — Telephone Encounter (Signed)
Sent in [place], per MD. Will see if we have a coupon card, and I will call patient to notify him to come pick it up.

## 2016-02-01 NOTE — Telephone Encounter (Signed)
Patient has a rash on his torso area and he would like to know if it could possibly be from the [place].  He would like a call back.

## 2016-02-01 NOTE — Telephone Encounter (Signed)
Called patient and notified him of the coupon card I had sat up front for him to try with the new medication.

## 2016-02-01 NOTE — Telephone Encounter (Signed)
Called patient and advised that we do not believe that rash is coming from medications per MD, he seen a NP at [facility], I advised him to let them know it was still there. PA is needed for patient invokana or farxiga, MD will decide.

## 2016-02-04 NOTE — Telephone Encounter (Signed)
Starting PA for invokana, per MD after sending farxiga, and it coming back needing a PA as well.

## 2016-02-06 DIAGNOSIS — N400 Enlarged prostate without lower urinary tract symptoms: Secondary | ICD-10-CM | POA: Diagnosis not present

## 2016-02-06 DIAGNOSIS — N401 Enlarged prostate with lower urinary tract symptoms: Secondary | ICD-10-CM | POA: Diagnosis not present

## 2016-02-06 DIAGNOSIS — N411 Chronic prostatitis: Secondary | ICD-10-CM | POA: Diagnosis not present

## 2016-02-09 ENCOUNTER — Other Ambulatory Visit: Payer: Self-pay | Admitting: Family Medicine

## 2016-02-09 DIAGNOSIS — J302 Other seasonal allergic rhinitis: Secondary | ICD-10-CM

## 2016-02-09 DIAGNOSIS — E1165 Type 2 diabetes mellitus with hyperglycemia: Secondary | ICD-10-CM

## 2016-02-09 DIAGNOSIS — E1151 Type 2 diabetes mellitus with diabetic peripheral angiopathy without gangrene: Secondary | ICD-10-CM

## 2016-02-09 DIAGNOSIS — IMO0002 Reserved for concepts with insufficient information to code with codable children: Secondary | ICD-10-CM

## 2016-02-11 DIAGNOSIS — L270 Generalized skin eruption due to drugs and medicaments taken internally: Secondary | ICD-10-CM | POA: Diagnosis not present

## 2016-02-19 ENCOUNTER — Other Ambulatory Visit: Payer: Self-pay | Admitting: Family Medicine

## 2016-02-19 DIAGNOSIS — I10 Essential (primary) hypertension: Secondary | ICD-10-CM

## 2016-02-21 ENCOUNTER — Ambulatory Visit (INDEPENDENT_AMBULATORY_CARE_PROVIDER_SITE_OTHER): Payer: BLUE CROSS/BLUE SHIELD | Admitting: Internal Medicine

## 2016-02-21 ENCOUNTER — Encounter: Payer: Self-pay | Admitting: Internal Medicine

## 2016-02-21 VITALS — BP 120/82 | HR 84 | Ht 70.0 in | Wt 191.0 lb

## 2016-02-21 DIAGNOSIS — E1159 Type 2 diabetes mellitus with other circulatory complications: Secondary | ICD-10-CM

## 2016-02-21 DIAGNOSIS — E1165 Type 2 diabetes mellitus with hyperglycemia: Secondary | ICD-10-CM

## 2016-02-21 LAB — POCT GLYCOSYLATED HEMOGLOBIN (HGB A1C): Hemoglobin A1C: 7.4

## 2016-02-21 MED ORDER — GLIPIZIDE ER 2.5 MG PO TB24: 2.5000 mg | ORAL_TABLET | Freq: Every day | ORAL | 3 refills | Status: DC

## 2016-02-21 NOTE — Patient Instructions (Addendum)
Please continue: - Januvia 100 mg daily before b'fast  Please start Glipizide XL 2.5 mg daily before b'fast.  Please return in 1.5 months with your sugar log.

## 2016-02-21 NOTE — Addendum Note (Signed)
Addended by: Caprice [place] T on: 02/21/2016 04:25 PM   Modules accepted: Orders

## 2016-02-21 NOTE — Progress Notes (Signed)
Patient ID: Chase [name], male   DOB: Jun 27, 1960, 55 y.o.   MRN: [id number]  HPI: Chase [name] is a 55 y.o.-year-old male, returning for follow-up for DM2, dx in 2014, non-insulin-dependent, uncontrolled, with complications (PVD, cerebro-vascular ds - s/p "mini"strokes, PN). Last visit 6 weeks ago  Last hemoglobin A1c was: Lab Results  Component Value Date   HGBA1C 9.8 (H) 11/13/2015   HGBA1C 6.8 (H) 02/11/2015   HGBA1C 6.8 (H) 10/03/2014   Pt is on a regimen of: - Januvia 100 mg daily in am - started 10/2015 Stopped Invokana 100 mg in am >> only got this for 1 mo He was on Metformin 500 mg po bid - in liquid form, as he could not swallow pills. He gets gi upset. He stopped this in 2014.  Pt checks his sugars 2-4 a day and they are: - am: 153-214, 294 >> ave 140 - after Invokana stopped: 150s - 2h after b'fast: n/c >> ave 151 - before lunch: 140-159 >> ave 135 - 2h after lunch: n/c >> ave 158 - before dinner: 130 >> ave 127 - 2h after dinner: n/c >> ave 183 - bedtime: 172-214 >> 208 - nighttime: n/c No lows. Lowest sugar was 130 >> 110; he has hypoglycemia awareness at 70.  Highest sugar was 294 >> 225 >> 224.  Glucometer: [employer] Next  Pt's meals are: - Breakfast: cereal - Honey nut bunches of oats - Lunch: Cereal or [place] burger or spaghetti, chicken salad sandwich  - Dinner: [place] gyro, grilled chicken sandwich or salad, chicken nuggets, dessert (ice cream) Snacks: 2-5 including fruit  - + mild CKD, last BUN/creatinine:  Lab Results  Component Value Date   BUN 11 11/13/2015   BUN 21 02/23/2015   CREATININE 1.10 11/13/2015   CREATININE 1.06 02/23/2015   - last set of lipids: Lab Results  Component Value Date   CHOL 150 11/13/2015   HDL 30.10 (L) 11/13/2015   LDLCALC 93 11/13/2015   LDLDIRECT 148.2 06/03/2013   TRIG 134.0 11/13/2015   CHOLHDL 5 11/13/2015   - last eye exam was in 2016. No DR.  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 He has anxiety, PTSD from childhood. Has varicose veins >> wears compresion hoses. He also has a h/o kidney stones.   ROS: Constitutional: no weight gain, + fatigue, + hot flushes, + poor sleep, + dysuria (sees urology) Eyes: + blurry vision, no xerophthalmia ENT: no sore throat, no nodules palpated in throat, + dysphagia/no odynophagia, + hoarseness, + tinnitus, + hypoacusis Cardiovascular: no CP/SOB/palpitations/+ leg swelling Respiratory: no cough/SOB Gastrointestinal: + N/V/D/+ C, + heartburn Musculoskeletal: + muscle aches/+ joint aches Skin: + rash, + itching - improving >> apparently related to allergy to sulfa Neurological: no tremors/numbness/tingling/dizziness, + HA  I reviewed pt's medications, allergies, PMH, social hx, family hx, and changes were documented in the history of present illness. Otherwise, unchanged from my initial visit note.  Past Medical History:  Diagnosis Date  . Allergy   . Arthritis   . Blood in stool   . Brain cyst   . Chest pain   . Constipation   . Diabetes mellitus without complication ([place])   . Difficulty urinating   . GERD (gastroesophageal reflux disease)   . Headache(784.0)   . Hearing loss   . Hyperlipidemia   .  Hypertension   . Leg swelling   . Nasal congestion   . PONV (postoperative nausea and vomiting)   . Rectal pain   . Rectal ulcer with bleeding after hemorrhoid banding 02/11/2015  . Sleep apnea   . Stroke ([place])   . Stroke ([place])   . Thrombosed hemorrhoids   . Trouble swallowing    Past Surgical History:  Procedure Laterality Date  . CATARACT EXTRACTION     x 3  . [place]   cataracts  . FLEXIBLE SIGMOIDOSCOPY N/A 02/11/2015   Procedure: FLEXIBLE SIGMOIDOSCOPY;  Surgeon: Gatha Mayer, MD;  Location: [facility];  Service: Endoscopy;  Laterality: N/A;  . INNER  EAR SURGERY     rt ear  . INNER EAR SURGERY  1992  . surgery - right arm     1983   Social History   Social History  . Marital status: Single    Spouse name: N/A  . Number of children: 0  . Years of education: N/A   Occupational History  . Not on file.   Social History Main Topics  . Smoking status: Never Smoker  . Smokeless tobacco: Never Used  . Alcohol use No     Comment: none  . Drug use: No  . Sexual activity: No   Other Topics Concern  . Not on file   Social History Narrative   No caffeine intake except for chocolate.   Current Outpatient Prescriptions on File Prior to Visit  Medication Sig Dispense Refill  . allopurinol (ZYLOPRIM) 100 MG tablet Take 1 tablet (100 mg total) by mouth daily. 90 tablet 0  . amLODipine (NORVASC) 5 MG tablet TAKE 1 TABLET BY MOUTH DAILY 90 tablet 0  . aspirin 81 MG chewable tablet Chew 1 tablet (81 mg total) by mouth daily. (Patient not taking: Reported on 01/02/2016) 90 tablet 3  . Azelastine HCl 0.15 % SOLN PLACE 2 SPRAYS IN EACH NOSTRIL every night 30 mL 11  . BAYER MICROLET LANCETS lancets CHECK BLOOD SUGAR FOUR TIMES PER DAY.  DX:E11.9 100 each 12  . Blood Glucose Monitoring Suppl (CONTOUR NEXT ONE) KIT 1 Device by Does not apply route 4 (four) times daily. CHECK BLOOD SUGAR FOUR TIMES PER DAY.  DX:E11.9 1 kit 0  . canagliflozin (INVOKANA) 100 MG TABS tablet Take 1 tablet (100 mg total) by mouth daily. 30 tablet 2  . cyclobenzaprine (FLEXERIL) 10 MG tablet Take 1 tablet (10 mg total) by mouth at bedtime. 30 tablet 3  . dapagliflozin propanediol (FARXIGA) 5 MG TABS tablet Take 5 mg by mouth daily. 30 tablet 1  . diazepam (VALIUM) 5 MG tablet TAKE 1 TABLET THREE TIMES A DAY 90 tablet 0  . FIBER SELECT GUMMIES PO Take 1 tablet by mouth daily.    Marland Kitchen glucose blood (BAYER CONTOUR NEXT TEST) test strip CHECK BLOOD SUGAR FOUR TIMES PER DAY.  DX:11.9 100 each 12  . HYDROcodone-acetaminophen (HYCET) 7.5-325 mg/15 ml solution TAKE 20 ML EVERY 6  HOURS AS NEEDED FOR PAIN 473 mL 0  . hydrocortisone (ANUSOL-HC) 25 MG suppository Place 1 suppository (25 mg total) rectally 2 (two) times daily. 20 suppository 2  . hydrOXYzine (ATARAX/VISTARIL) 25 MG tablet TAKE 1 TABLET (25 MG TOTAL) BY MOUTH EVERY 8 (EIGHT) HOURS AS NEEDED FOR ANXIETY OR ITCHING. 90 tablet 0  . levocetirizine (XYZAL) 5 MG tablet TAKE 1 TABLET EVERY DAY IN THE EVENING 30 tablet 11  . meclizine (ANTIVERT) 25 MG tablet  TAKE 1 TABLET (25 MG TOTAL) BY MOUTH 3 (THREE) TIMES DAILY AS NEEDED. 60 tablet 0  . metoprolol tartrate (LOPRESSOR) 25 MG tablet TAKE 1 TABLET BY MOUTH TWICE A DAY -MAY CRUSH IN FOOD 180 tablet 0  . mometasone (ELOCON) 0.1 % ointment Apply 1 application topically daily as needed (for rash). 45 g 2  . polyethylene glycol powder (GLYCOLAX/MIRALAX) powder TAKE 17 GRAMS BY MOUTH 2 (TWO) TIMES DAILY AS NEEDED. 527 g 5  . pravastatin (PRAVACHOL) 20 MG tablet Take 1 tablet (20 mg total) by mouth daily. 90 tablet 3  . ranitidine (ZANTAC) 75 MG/5ML syrup TAKE 10 MLS BY MOUTH 2 TIMES DAILY AS NEEDED. FOR INDIGESTION. 300 mL 0  . sitaGLIPtin (JANUVIA) 100 MG tablet Take 1 tablet (100 mg total) by mouth daily. Repeat labs are due now 30 tablet 0  . venlafaxine XR (EFFEXOR-XR) 37.5 MG 24 hr capsule TAKE 1 CAPSULE (37.5 MG TOTAL) BY MOUTH DAILY. 30 capsule 5   No current facility-administered medications on file prior to visit.    Allergies  Allergen Reactions  . Amoxicillin Hives, Itching and Other (See Comments)    White tongue; ? hives  . Flonase [Fluticasone Propionate]     Nasal Sores  . Terfenadine     ? reaction  . Sulfa Antibiotics Rash   Family History  Problem Relation Age of Onset  . Breast cancer Mother 64    breast  . Stroke Father   . Heart disease Father   . Hypertension Father   . Heart attack Father   . Hypertension Brother   . Hyperlipidemia Neg Hx   . Diabetes Neg Hx    PE: BP 120/82 (BP Location: Left Arm, Patient Position: Sitting)    Pulse 84   Ht '5\' 10"'$  (1.778 m)   Wt 191 lb (86.6 kg)   SpO2 96%   BMI 27.41 kg/m  Wt Readings from Last 3 Encounters:  02/21/16 191 lb (86.6 kg)  01/02/16 193 lb (87.5 kg)  12/20/15 195 lb 6.4 oz (88.6 kg)   Constitutional: overweight, in NAD Eyes: Surgical pupil R, EOMI, no exophthalmos ENT: moist mucous membranes, no thyromegaly, no cervical lymphadenopathy Cardiovascular: RRR, No MRG Respiratory: CTA B Gastrointestinal: abdomen soft, NT, ND, BS+ Musculoskeletal: no deformities, strength intact in all 4 Skin: moist, warm, + rash - several parts of his body: red, macular  Neurological: no tremor with outstretched hands, DTR normal in all 4  ASSESSMENT: 1. DM2, non-insulin-dependent, uncontrolled, with complications - PVD - cerebro-vascular ds - s/p "mini"strokes - PN  PLAN:  1. Patient with 3 years of previously controlled diabetes, with worsening control in last year and an increase of HbA1c by 3%, while off DM meds. He had GI sxs with Metformin >> stopped 3 years ago and was initially able to control his DM without medicines, but the sugars increased recently. I saw him 1.5 mo ago >> we added Invokana to Januvia. He got invokana but only for 1 mo >> sugars better >> then not covered anymore.  - reviewing his meter download >> sugars better with Invokana, now higher but not as high as before. Will try Glipizide XL. - I suggested to:  Patient Instructions  Please continue: - Januvia 100 mg daily before b'fast  Please start Glipizide XL 2.5 mg daily before b'fast.  Please return in 1.5 months with your sugar log.   - check sugars at different times of the day - check 2 times a day, rotating checks -  advised for yearly eye exams >> he needs one >> advised to schedule - Return to clinic in 1.5 mo with sugar log   Philemon Kingdom, MD PhD [facility] Endocrinology

## 2016-02-27 ENCOUNTER — Other Ambulatory Visit: Payer: Self-pay | Admitting: Family Medicine

## 2016-02-27 ENCOUNTER — Encounter: Payer: Self-pay | Admitting: Family Medicine

## 2016-02-27 ENCOUNTER — Ambulatory Visit (INDEPENDENT_AMBULATORY_CARE_PROVIDER_SITE_OTHER): Payer: BLUE CROSS/BLUE SHIELD | Admitting: Family Medicine

## 2016-02-27 VITALS — BP 150/82 | HR 90 | Temp 98.3°F | Ht 70.0 in | Wt 191.0 lb

## 2016-02-27 DIAGNOSIS — J019 Acute sinusitis, unspecified: Secondary | ICD-10-CM | POA: Diagnosis not present

## 2016-02-27 DIAGNOSIS — R350 Frequency of micturition: Secondary | ICD-10-CM | POA: Diagnosis not present

## 2016-02-27 DIAGNOSIS — M25511 Pain in right shoulder: Secondary | ICD-10-CM

## 2016-02-27 MED ORDER — CEPHALEXIN 250 MG/5ML PO SUSR: 500.0000 mg | Freq: Four times a day (QID) | ORAL | 0 refills | Status: DC

## 2016-02-27 NOTE — Progress Notes (Signed)
Pre visit review using our clinic review tool, if applicable. No additional management support is needed unless otherwise documented below in the visit note. 

## 2016-02-27 NOTE — Progress Notes (Signed)
   Subjective:    Patient ID: Chase [name], male    DOB: April 18, 1961, 55 y.o.   MRN: [id number]  HPI Here for one week of sinus pressure, PND, ST, and a dry cough. No fever.    Review of Systems  Constitutional: Negative.   HENT: Positive for congestion, postnasal drip, sinus pressure and sore throat. Negative for ear pain.   Eyes: Negative.   Respiratory: Positive for cough.   Gastrointestinal: Negative.        Objective:   Physical Exam  Constitutional: He appears well-developed and well-nourished.  HENT:  Right Ear: External ear normal.  Left Ear: External ear normal.  Nose: Nose normal.  Mouth/Throat: Oropharynx is clear and moist.  Eyes: Conjunctivae are normal.  Pulmonary/Chest: Effort normal and breath sounds normal.  Lymphadenopathy:    He has no cervical adenopathy.          Assessment & Plan:  Sinusitis, treat with Keflex. Add Mucinex prn.  Laurey Morale, MD

## 2016-02-28 NOTE — Telephone Encounter (Signed)
Rx faxed.    KP 

## 2016-02-28 NOTE — Telephone Encounter (Signed)
Last seen 11/13/15 and filled 05/31/15 #30 with 3 rf   Please advise    KP

## 2016-02-28 NOTE — Telephone Encounter (Signed)
Refill x1 

## 2016-03-03 LAB — HM DIABETES EYE EXAM

## 2016-03-04 DIAGNOSIS — R350 Frequency of micturition: Secondary | ICD-10-CM | POA: Diagnosis not present

## 2016-03-04 DIAGNOSIS — Z6827 Body mass index (BMI) 27.0-27.9, adult: Secondary | ICD-10-CM | POA: Diagnosis not present

## 2016-03-04 DIAGNOSIS — N400 Enlarged prostate without lower urinary tract symptoms: Secondary | ICD-10-CM | POA: Diagnosis not present

## 2016-03-08 ENCOUNTER — Other Ambulatory Visit: Payer: Self-pay | Admitting: Family Medicine

## 2016-03-08 DIAGNOSIS — E1165 Type 2 diabetes mellitus with hyperglycemia: Secondary | ICD-10-CM

## 2016-03-08 DIAGNOSIS — E1151 Type 2 diabetes mellitus with diabetic peripheral angiopathy without gangrene: Secondary | ICD-10-CM

## 2016-03-08 DIAGNOSIS — IMO0002 Reserved for concepts with insufficient information to code with codable children: Secondary | ICD-10-CM

## 2016-03-10 ENCOUNTER — Other Ambulatory Visit: Payer: Self-pay

## 2016-03-10 ENCOUNTER — Encounter: Payer: Self-pay | Admitting: Internal Medicine

## 2016-03-10 DIAGNOSIS — E1151 Type 2 diabetes mellitus with diabetic peripheral angiopathy without gangrene: Secondary | ICD-10-CM

## 2016-03-10 DIAGNOSIS — E1165 Type 2 diabetes mellitus with hyperglycemia: Secondary | ICD-10-CM

## 2016-03-10 DIAGNOSIS — IMO0002 Reserved for concepts with insufficient information to code with codable children: Secondary | ICD-10-CM

## 2016-03-10 MED ORDER — SITAGLIPTIN PHOSPHATE 100 MG PO TABS: 100.0000 mg | ORAL_TABLET | Freq: Every day | ORAL | 0 refills | Status: DC

## 2016-03-10 NOTE — Telephone Encounter (Signed)
Patient is seeing Endo for DM. Please advise if this refill is appropriate.    KP

## 2016-03-11 ENCOUNTER — Other Ambulatory Visit: Payer: Self-pay | Admitting: Internal Medicine

## 2016-03-11 MED ORDER — CYCLOSET 0.8 MG PO TABS: ORAL_TABLET | ORAL | 3 refills | Status: DC

## 2016-03-21 ENCOUNTER — Other Ambulatory Visit: Payer: Self-pay | Admitting: Family Medicine

## 2016-03-21 DIAGNOSIS — M25511 Pain in right shoulder: Secondary | ICD-10-CM

## 2016-03-29 ENCOUNTER — Other Ambulatory Visit: Payer: Self-pay | Admitting: Family Medicine

## 2016-04-06 ENCOUNTER — Other Ambulatory Visit: Payer: Self-pay | Admitting: Family Medicine

## 2016-04-08 ENCOUNTER — Other Ambulatory Visit: Payer: Self-pay | Admitting: Neurology

## 2016-04-08 DIAGNOSIS — I639 Cerebral infarction, unspecified: Secondary | ICD-10-CM

## 2016-04-10 ENCOUNTER — Other Ambulatory Visit: Payer: Self-pay | Admitting: Internal Medicine

## 2016-04-10 DIAGNOSIS — IMO0002 Reserved for concepts with insufficient information to code with codable children: Secondary | ICD-10-CM

## 2016-04-10 DIAGNOSIS — E1151 Type 2 diabetes mellitus with diabetic peripheral angiopathy without gangrene: Secondary | ICD-10-CM

## 2016-04-10 DIAGNOSIS — E1165 Type 2 diabetes mellitus with hyperglycemia: Secondary | ICD-10-CM

## 2016-04-17 ENCOUNTER — Encounter: Payer: Self-pay | Admitting: Internal Medicine

## 2016-04-18 ENCOUNTER — Encounter: Payer: Self-pay | Admitting: Internal Medicine

## 2016-04-18 ENCOUNTER — Ambulatory Visit (INDEPENDENT_AMBULATORY_CARE_PROVIDER_SITE_OTHER): Payer: BLUE CROSS/BLUE SHIELD | Admitting: Internal Medicine

## 2016-04-18 VITALS — BP 126/82 | HR 95 | Ht 70.5 in | Wt 194.0 lb

## 2016-04-18 DIAGNOSIS — E1159 Type 2 diabetes mellitus with other circulatory complications: Secondary | ICD-10-CM

## 2016-04-18 DIAGNOSIS — E1165 Type 2 diabetes mellitus with hyperglycemia: Secondary | ICD-10-CM

## 2016-04-18 MED ORDER — EMPAGLIFLOZIN 25 MG PO TABS: 25.0000 mg | ORAL_TABLET | Freq: Every day | ORAL | 5 refills | Status: DC

## 2016-04-18 NOTE — Progress Notes (Signed)
Patient ID: Chase [name], male   DOB: 12/13/1960, 55 y.o.   MRN: [id number]  HPI: [name] is a 55 y.o.-year-old male, returning for follow-up for DM2, dx in 2014, non-insulin-dependent, uncontrolled, with complications (PVD, cerebro-vascular ds - s/p "mini"strokes, PN). Last visit 2 mo ago  Last hemoglobin A1c was: Lab Results  Component Value Date   HGBA1C 7.4 02/21/2016   HGBA1C 9.8 (H) 11/13/2015   HGBA1C 6.8 (H) 02/11/2015   Pt is on a regimen of: - Januvia 100 mg daily in am - started 10/2015 - Cycloset 1.6 mg daily in am Stopped Glipizide b/c rash. Stopped Invokana 100 mg in am >> only got this for 1 mo He was on Metformin 500 mg po bid - in liquid form, as he could not swallow pills. He gets gi upset. He stopped this in 2014.  Pt checks his sugars 2-4 a day and they are high in last month: - fasting: 153-214, 294 >> ave 140 - after Invokana stopped: 150s >> brunch:  152-194 - 2h after lunch: n/c >> ave 158 >> 312 - before dinner: 130 >> ave 127 >> 216 - 2h after dinner: n/c >> ave 183 >> 191-273 - bedtime: 172-214 >> 208 - nighttime: n/c No lows. Lowest sugar was 130 >> 110 >> 152; he has hypoglycemia awareness at 70.  Highest sugar was 294 >> 225 >> 224 >> 300s.  Glucometer: [employer] Next  Pt's meals are: - Breakfast: cereal - Honey nut bunches of oats - Lunch: Cereal or turkey burger or spaghetti, chicken salad sandwich  - Dinner: [place] gyro, grilled chicken sandwich or salad, chicken nuggets, dessert (ice cream) Snacks: 2-5 including fruit  - + mild CKD, last BUN/creatinine:  Lab Results  Component Value Date   BUN 11 11/13/2015   BUN 21 02/23/2015   CREATININE 1.10 11/13/2015   CREATININE 1.06 02/23/2015   - last set of lipids: Lab Results  Component Value Date   CHOL 150 11/13/2015   HDL 30.10 (L) 11/13/2015   LDLCALC 93 11/13/2015   LDLDIRECT 148.2 06/03/2013   TRIG 134.0 11/13/2015   CHOLHDL 5 11/13/2015   - last eye exam was in  02/2016. No DR.  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 He has anxiety, PTSD from childhood. Has varicose veins >> wears compresion hoses. He also has a h/o kidney stones.   ROS: Constitutional: no weight gain, + fatigue, + hot flushes, + poor sleep, + dysuria (sees urology) Eyes: + blurry vision, no xerophthalmia ENT: no sore throat, no nodules palpated in throat, + dysphagia/no odynophagia, + hoarseness, + tinnitus, + hypoacusis Cardiovascular: no CP/SOB/palpitations/+ leg swelling Respiratory: + cough/+ SOB Gastrointestinal: + N/V/D/+ C, + heartburn Musculoskeletal: + muscle aches/+ joint aches Skin: no rash Neurological: no tremors/numbness/tingling/dizziness, + HA  I reviewed pt's medications, allergies, PMH, social hx, family hx, and changes were documented in the history of present illness. Otherwise, unchanged from my initial visit note.  Past Medical History:  Diagnosis Date  . Allergy   . Arthritis   . Blood in stool   . Brain cyst   . Chest pain   . Constipation   . Diabetes mellitus without complication ([facility])   . Difficulty urinating   . GERD (gastroesophageal reflux disease)   . Headache(784.0)   . Hearing loss   .   Hyperlipidemia   . Hypertension   . Leg swelling   . Nasal congestion   . PONV (postoperative nausea and vomiting)   . Rectal pain   . Rectal ulcer with bleeding after hemorrhoid banding 02/11/2015  . Sleep apnea   . Stroke ([facility])   . Stroke ([facility])   . Thrombosed hemorrhoids   . Trouble swallowing    Past Surgical History:  Procedure Laterality Date  . CATARACT EXTRACTION     x 3  . EYE SURGERY  1968, 1985, 1987   cataracts  . FLEXIBLE SIGMOIDOSCOPY N/A 02/11/2015   Procedure: FLEXIBLE SIGMOIDOSCOPY;  Surgeon: Carl E Gessner, MD;  Location: [facility];  Service: Endoscopy;  Laterality: N/A;   . INNER EAR SURGERY     rt ear  . INNER EAR SURGERY  1992  . surgery - right arm     1983   Social History   Social History  . Marital status: Single    Spouse name: N/A  . Number of children: 0  . Years of education: N/A   Occupational History  . Not on file.   Social History Main Topics  . Smoking status: Never Smoker  . Smokeless tobacco: Never Used  . Alcohol use No     Comment: none  . Drug use: No  . Sexual activity: No   Other Topics Concern  . Not on file   Social History Narrative   No caffeine intake except for chocolate.   Current Outpatient Prescriptions on File Prior to Visit  Medication Sig Dispense Refill  . allopurinol (ZYLOPRIM) 100 MG tablet Take 1 tablet (100 mg total) by mouth daily. Labs are due now 30 tablet 0  . amLODipine (NORVASC) 5 MG tablet TAKE 1 TABLET BY MOUTH DAILY 90 tablet 0  . Azelastine HCl 0.15 % SOLN PLACE 2 SPRAYS IN EACH NOSTRIL every night 30 mL 11  . BAYER MICROLET LANCETS lancets CHECK BLOOD SUGAR FOUR TIMES PER DAY.  DX:E11.9 100 each 12  . Blood Glucose Monitoring Suppl (CONTOUR NEXT ONE) KIT 1 Device by Does not apply route 4 (four) times daily. CHECK BLOOD SUGAR FOUR TIMES PER DAY.  DX:E11.9 1 kit 0  . [facility] CH[facility] ASPIRIN 81 MG chewable tablet CHEW 1 TABLET BY MOUTH DAILY. 90 tablet 3  . cyclobenzaprine (FLEXERIL) 10 MG tablet TAKE 1 TABLET (10 MG TOTAL) BY MOUTH AT BEDTIME. 30 tablet 0  . CYCLOSET 0.8 MG TABS Take 2 tablets daily in a.m. with breakfast 60 tablet 3  . diazepam (VALIUM) 5 MG tablet TAKE 1 TABLET THREE TIMES A DAY 90 tablet 0  . FIBER SELECT GUMMIES PO Take 1 tablet by mouth daily.    . glipiZIDE (GLUCOTROL XL) 2.5 MG 24 hr tablet Take 1 tablet (2.5 mg total) by mouth daily with breakfast. 90 tablet 3  . glucose blood (BAYER CONTOUR NEXT TEST) test strip CHECK BLOOD SUGAR FOUR TIMES PER DAY.  DX:11.9 100 each 12  . HYDROcodone-acetaminophen (HYCET) 7.5-325 mg/15 ml solution TAKE 20 ML EVERY 6 HOURS AS NEEDED  FOR PAIN 473 mL 0  . hydrocortisone 2.5 % cream APPLY 2 TIMES DAILY TO GROIN AREA AS NEEDED.  1  . hydrOXYzine (ATARAX/VISTARIL) 25 MG tablet TAKE 1 TABLET (25 MG TOTAL) BY MOUTH EVERY 8 (EIGHT) HOURS AS NEEDED FOR ANXIETY OR ITCHING. 90 tablet 0  . JANUVIA 100 MG tablet TAKE 1 TABLET (100 MG TOTAL) BY MOUTH DAILY. REPEAT LABS ARE DUE NOW 30 tablet 0  .   meclizine (ANTIVERT) 25 MG tablet TAKE 1 TABLET (25 MG TOTAL) BY MOUTH 3 (THREE) TIMES DAILY AS NEEDED. 60 tablet 0  . metoprolol tartrate (LOPRESSOR) 25 MG tablet TAKE 1 TABLET BY MOUTH TWICE A DAY -MAY CRUSH IN FOOD 180 tablet 0  . mometasone (ELOCON) 0.1 % ointment Apply 1 application topically daily as needed (for rash). 45 g 2  . polyethylene glycol powder (GLYCOLAX/MIRALAX) powder TAKE 17 GRAMS BY MOUTH 2 TIMES DAILY AS NEEDED. 527 g 5  . pravastatin (PRAVACHOL) 20 MG tablet Take 1 tablet (20 mg total) by mouth daily. 90 tablet 3  . PROCTOFOAM HC rectal foam APPLY FOAM THREE TIMES A DAY AS NEEDED FOR 7 DAYS  10  . ranitidine (ZANTAC) 75 MG/5ML syrup TAKE 10 MLS BY MOUTH 2 TIMES DAILY AS NEEDED. FOR INDIGESTION. 300 mL 0  . triamcinolone cream (KENALOG) 0.1 % APPLY TO RASH TWICE A DAY ON CHEST/ARMS/LEGS  0  . venlafaxine XR (EFFEXOR-XR) 37.5 MG 24 hr capsule TAKE 1 CAPSULE (37.5 MG TOTAL) BY MOUTH DAILY. 30 capsule 5   No current facility-administered medications on file prior to visit.    Allergies  Allergen Reactions  . Amoxicillin Hives, Itching and Other (See Comments)    White tongue; ? hives  . Flonase [Fluticasone Propionate]     Nasal Sores  . Terfenadine     ? reaction  . Sulfa Antibiotics Rash   Family History  Problem Relation Age of Onset  . Breast cancer Mother 12    breast  . Stroke Father   . Heart disease Father   . Hypertension Father   . Heart attack Father   . Hypertension Brother   . Hyperlipidemia Neg Hx   . Diabetes Neg Hx    PE: BP 126/82   Pulse 95   Ht 5' 10.5" (1.791 m)   Wt 194 lb (88 kg)    SpO2 96%   BMI 27.44 kg/m  Wt Readings from Last 3 Encounters:  04/18/16 194 lb (88 kg)  02/27/16 191 lb (86.6 kg)  02/21/16 191 lb (86.6 kg)   Constitutional: overweight, in NAD Eyes: Surgical pupil R, EOMI, no exophthalmos ENT: moist mucous membranes, no thyromegaly, no cervical lymphadenopathy Cardiovascular: RRR, No MRG Respiratory: CTA B Gastrointestinal: abdomen soft, NT, ND, BS+ Musculoskeletal: no deformities, strength intact in all 4 Skin: moist, warm, no rashes Neurological: no tremor with outstretched hands, DTR normal in all 4  ASSESSMENT: 1. DM2, non-insulin-dependent, uncontrolled, with complications - PVD - cerebro-vascular ds - s/p "mini"strokes - PN  PLAN:  1. Patient with 3 years of previously controlled diabetes, with worsening control in last year and an increase of HbA1c by 3%, while off DM meds. He had GI sxs with Metformin >> stopped; and was initially able to control his DM without medicines, but the sugars increased recently especially when he had to come off Invokana as insurance did not cover it anymore. - sugars are higher now >> he again refuses insulin now. I advised him to stop Cycloset and will send Jardiance to his pharmacy. Will need a PA if this is not covered as the SGLT1 inh are the only drugs controlling his DM for now. - I suggested to:  Patient Instructions  Please continue: - Januvia 100 mg daily before b'fast  Stop Cycloset.  Start: - Jardiance 25 mg daily in am  Please ask your eye dr. To send me the last eye exam report.  Please return in 1.5 months with your  sugar log.   - check sugars at different times of the day - check 2 times a day, rotating checks - advised for yearly eye exams >> he is UTD - Return to clinic in 1.5 mo with sugar log    , MD PhD [facility]   

## 2016-04-18 NOTE — Patient Instructions (Signed)
Please continue: - Januvia 100 mg daily before b'fast  Stop Cycloset.  Start: - Jardiance 25 mg daily in am  Please ask your eye dr. To send me the last eye exam report.  Please return in 1.5 months with your sugar log.

## 2016-04-21 ENCOUNTER — Encounter: Payer: Self-pay | Admitting: Internal Medicine

## 2016-04-21 ENCOUNTER — Other Ambulatory Visit: Payer: Self-pay

## 2016-04-21 MED ORDER — EMPAGLIFLOZIN 25 MG PO TABS: 25.0000 mg | ORAL_TABLET | Freq: Every day | ORAL | 5 refills | Status: DC

## 2016-04-22 ENCOUNTER — Telehealth: Payer: Self-pay

## 2016-04-22 NOTE — Telephone Encounter (Signed)
Called [place] [employer] for PA apporval for Jardiance according to pharmacy. It was approved, PA number [id number].

## 2016-04-26 ENCOUNTER — Other Ambulatory Visit: Payer: Self-pay | Admitting: Family Medicine

## 2016-04-29 ENCOUNTER — Other Ambulatory Visit: Payer: Self-pay | Admitting: Family Medicine

## 2016-04-29 DIAGNOSIS — M25511 Pain in right shoulder: Secondary | ICD-10-CM

## 2016-04-29 NOTE — Telephone Encounter (Signed)
Please advise below requests. Pt last seen by you 11/13/15 and advised f/u in August. Pt is past due.  Last ranitidine Rx: 07/09/15, 361mL Last Hydroxyzine RX: 01/10/16, #90 Cyclobenzaprine RX: 03/21/16, #30

## 2016-04-30 NOTE — Telephone Encounter (Signed)
Diazepam Rx not received by pharmacy. [facility]

## 2016-05-07 ENCOUNTER — Other Ambulatory Visit: Payer: Self-pay | Admitting: Internal Medicine

## 2016-05-07 DIAGNOSIS — IMO0002 Reserved for concepts with insufficient information to code with codable children: Secondary | ICD-10-CM

## 2016-05-07 DIAGNOSIS — E1151 Type 2 diabetes mellitus with diabetic peripheral angiopathy without gangrene: Secondary | ICD-10-CM

## 2016-05-07 DIAGNOSIS — E1165 Type 2 diabetes mellitus with hyperglycemia: Secondary | ICD-10-CM

## 2016-05-18 ENCOUNTER — Other Ambulatory Visit: Payer: Self-pay | Admitting: Family Medicine

## 2016-05-23 ENCOUNTER — Other Ambulatory Visit: Payer: Self-pay | Admitting: Family Medicine

## 2016-05-23 DIAGNOSIS — I10 Essential (primary) hypertension: Secondary | ICD-10-CM

## 2016-05-23 NOTE — Telephone Encounter (Signed)
Patient needs a an appointment.  Past due for appointment. Was due to return in August.

## 2016-05-27 ENCOUNTER — Other Ambulatory Visit: Payer: Self-pay | Admitting: Emergency Medicine

## 2016-05-27 ENCOUNTER — Other Ambulatory Visit: Payer: Self-pay | Admitting: Family Medicine

## 2016-05-27 DIAGNOSIS — M25511 Pain in right shoulder: Secondary | ICD-10-CM

## 2016-05-27 NOTE — Telephone Encounter (Signed)
Please advise on refill for cyclobenzaprine.  Past due for office visit.

## 2016-05-30 NOTE — Telephone Encounter (Signed)
Rx sent in on 05/27/16

## 2016-06-02 ENCOUNTER — Other Ambulatory Visit: Payer: Self-pay | Admitting: Internal Medicine

## 2016-06-02 DIAGNOSIS — IMO0002 Reserved for concepts with insufficient information to code with codable children: Secondary | ICD-10-CM

## 2016-06-02 DIAGNOSIS — E1151 Type 2 diabetes mellitus with diabetic peripheral angiopathy without gangrene: Secondary | ICD-10-CM

## 2016-06-02 DIAGNOSIS — E1165 Type 2 diabetes mellitus with hyperglycemia: Secondary | ICD-10-CM

## 2016-06-10 ENCOUNTER — Ambulatory Visit: Payer: BLUE CROSS/BLUE SHIELD | Admitting: Internal Medicine

## 2016-06-14 ENCOUNTER — Other Ambulatory Visit: Payer: Self-pay | Admitting: Family Medicine

## 2016-06-23 ENCOUNTER — Telehealth: Payer: Self-pay | Admitting: Family Medicine

## 2016-06-23 NOTE — Telephone Encounter (Signed)
Patient called stating that he was seen by the dentist today and he has an abscessed tooth. He stated that the dentist prescribed generic Keflex 250 MG 2 tsp every 6 hours for 5 days. Patient states that his medicaid will not cover the medication because it was written by a dentist and he is requesting that Dr. Carollee Herter rewrite the prescription for him so the insurance will pay for it. Please advise  Phone: [phone]  [id number] [place], [address] [place]

## 2016-06-23 NOTE — Telephone Encounter (Signed)
Spoke with pt, pt states he was prescribed Rx from his Dentist and his insurance would not cover it because it's from the Dentist. Pt request for provider to rewrite same Rx, provider state pt have to be seen to be prescribed Rx. Pt had no questions or concerns. LB

## 2016-06-24 ENCOUNTER — Other Ambulatory Visit: Payer: Self-pay | Admitting: Family Medicine

## 2016-06-24 ENCOUNTER — Encounter: Payer: Self-pay | Admitting: Family Medicine

## 2016-06-24 ENCOUNTER — Encounter: Payer: Self-pay | Admitting: Internal Medicine

## 2016-06-24 DIAGNOSIS — M25511 Pain in right shoulder: Secondary | ICD-10-CM

## 2016-06-24 MED ORDER — HYDROCODONE-ACETAMINOPHEN 7.5-325 MG/15ML PO SOLN: ORAL | 0 refills | Status: DC

## 2016-06-24 NOTE — Telephone Encounter (Signed)
Last seen 11/13/15  Last filled 11/13/15  Sig;TAKE 20 ML EVERY 6 HOURS AS NEEDED FOR PAIN  Patient is in pain due to a tooth being pulled. Patient states he did not receive any pain medication for his tooth. Patient has appointment with PCP 07/26/15  Please advise PC

## 2016-06-25 NOTE — Telephone Encounter (Signed)
Dr Renne Crigler may know better.  I'm pretty sure we don't have freestyle--- do you know? He may have to check with medicaid--- as far as what they pay for.   Is the medicaid new?  If he has [place] access he needs to know we don't take that.

## 2016-06-25 NOTE — Telephone Encounter (Signed)
I 'm pretty sure we don't have that brand--- do you know? He would have to check with pharmacy and or medicaid to see if they pay Do we know what ki

## 2016-06-26 MED ORDER — ACCU-CHEK SOFT TOUCH LANCETS MISC: 12 refills | Status: DC

## 2016-06-26 MED ORDER — ACCU-CHEK GUIDE W/DEVICE KIT: 1.0000 | PACK | Freq: Once | 0 refills | Status: DC

## 2016-06-26 MED ORDER — GLUCOSE BLOOD VI STRP: ORAL_STRIP | 12 refills | Status: DC

## 2016-06-26 NOTE — Addendum Note (Signed)
Addended byDamita Dunnings D on: 06/26/2016 10:34 AM   Modules accepted: Orders

## 2016-06-27 ENCOUNTER — Other Ambulatory Visit: Payer: Self-pay | Admitting: *Deleted

## 2016-06-27 ENCOUNTER — Encounter: Payer: Self-pay | Admitting: *Deleted

## 2016-06-27 DIAGNOSIS — M25511 Pain in right shoulder: Secondary | ICD-10-CM

## 2016-06-27 MED ORDER — HYDROCODONE-ACETAMINOPHEN 7.5-325 MG/15ML PO SOLN: ORAL | 0 refills | Status: DC

## 2016-06-27 NOTE — Telephone Encounter (Signed)
Patient called and is expecting a call from [place] and states that if she reaches a voice mail to please leave a message. Please advise  Phone:  [phone]

## 2016-06-29 ENCOUNTER — Other Ambulatory Visit: Payer: Self-pay | Admitting: Family Medicine

## 2016-06-29 DIAGNOSIS — I10 Essential (primary) hypertension: Secondary | ICD-10-CM

## 2016-06-29 DIAGNOSIS — M25511 Pain in right shoulder: Secondary | ICD-10-CM

## 2016-06-30 ENCOUNTER — Telehealth: Payer: Self-pay

## 2016-06-30 ENCOUNTER — Other Ambulatory Visit: Payer: Self-pay

## 2016-06-30 ENCOUNTER — Telehealth: Payer: Self-pay | Admitting: Family Medicine

## 2016-06-30 ENCOUNTER — Telehealth: Payer: Self-pay | Admitting: Internal Medicine

## 2016-06-30 ENCOUNTER — Encounter: Payer: Self-pay | Admitting: Family Medicine

## 2016-06-30 ENCOUNTER — Ambulatory Visit (INDEPENDENT_AMBULATORY_CARE_PROVIDER_SITE_OTHER): Payer: BLUE CROSS/BLUE SHIELD | Admitting: Family Medicine

## 2016-06-30 VITALS — BP 128/90 | HR 88 | Temp 98.4°F | Wt 189.0 lb

## 2016-06-30 DIAGNOSIS — K529 Noninfective gastroenteritis and colitis, unspecified: Secondary | ICD-10-CM

## 2016-06-30 DIAGNOSIS — R110 Nausea: Secondary | ICD-10-CM | POA: Diagnosis not present

## 2016-06-30 MED ORDER — ONDANSETRON 4 MG PO TBDP: ORAL_TABLET | ORAL | 0 refills | Status: DC

## 2016-06-30 NOTE — Telephone Encounter (Signed)
We can call prescription for Zofran 4 mg up to 3 times a day, 10 tablets He can leave off his Jardiance until he is eating and drinking normally and restart Januvia when he is able to keep something down He does need to increase his fluids

## 2016-06-30 NOTE — Telephone Encounter (Signed)
Can you please advise in Dr.Gherghe's absences? Patient called in a stated that he has a stomach bug, with nausea and diarrhea. Patient is questioning if he needs to take his medication even tho he can not keep anything down. He takes [place]. Patient checked sugar while on the phone with me and it was 121. Patient also asked if there was anyway we could send in Zofran for him, as he called his PCP and they did not have any openings today and would not call in the medication without being seen. Please advise, thank you!

## 2016-06-30 NOTE — Telephone Encounter (Signed)
Called pt to follow-up. He reports diarrhea has resolved (he has had 2 formed BMs today), but he continues to experience nausea and abdominal cramping. Endorses low grade fever overnight, Tmax 99.4 oral. Afebrile today. His symptoms started last night after eating mac and cheese. He is having trouble drinking anything because he starts to experience nausea, hiccup/burp, and a bad taste in his mouth when he does. 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 Denies bloody/coffee ground emesis, vomiting bile, abdominal swelling, SOB, diaphoresis, shoulder/arm/jaw pain, dizziness, and lightheadedness.   Given continued nausea, abd cramping, and decreased fluid intake, acute appt scheduled today w/ Almira Coaster, NP at [facility] as our office is fully booked.

## 2016-06-30 NOTE — Telephone Encounter (Signed)
Called and spoke with patient regarding Dr.Kumar's note. Patient agreed to go with changes for now until stomach bug passes. I also advised we had sent in Zofran to pharmacy. Patient had no questions at this time. Thank you!

## 2016-06-30 NOTE — Telephone Encounter (Signed)
Patient Name: Chase [name]  DOB: [birth year]    Initial Comment Caller states after he had eaten he starting vomiting and having diarrhea. He was up all night. He is not keeping down anything. Feels like stomach is tied in a knot. He does not have any medication for nausea. He is feeling weakness.    Nurse Assessment  Nurse: Raphael Gibney, RN, Vanita Ingles Date/Time (Eastern Time): 06/30/2016 11:16:39 AM  Confirm and document reason for call. If symptomatic, describe symptoms. ---Caller states he has been vomiting and having diarrhea since last night. Vomited x 2. last night. had diarrhea all night. Having nausea. Had solid BM at 8 am. Having upper abd cramping and nausea still. abd cramping is constant. Pain level ranging from 3 to severe. He has urinated. has thrombosed hemorrhoids. Stools have been brown. Blood sugar 121.  Does the patient have any new or worsening symptoms? ---Yes  Will a triage be completed? ---Yes  Related visit to physician within the last 2 weeks? ---No  Does the PT have any chronic conditions? (i.e. diabetes, asthma, etc.) ---Yes  List chronic conditions. ---thrombosed hemorrhoids; diabetes  Is this a behavioral health or substance abuse call? ---No     Guidelines    Guideline Title Affirmed Question Affirmed Notes  Abdominal Pain - Upper [1] Pain lasts > 10 minutes AND [2] age > 49    Final Disposition User   Go to ED Now Raphael Gibney, RN, Vanita Ingles    Comments  Pt states he does not want to go to the ER. He would like to know if there is any medication he can take. He has Mylanta and prescription Zantac at home and wants to know if there is any medication he can take or if a prescription can be called in.  Called office and spoke to [place] and gave report that pt vomited x 2 last night and had diarrhea until 8 am. Had abd cramping all night and is still having abd cramping and nausea. Has mylanta and zantac and wants to know if he can take that. Pain level 3 to severe. Triage outcome of  go to ER now and pt does not want to go.   Referrals  GO TO FACILITY REFUSED   Disagree/Comply: Comply

## 2016-06-30 NOTE — Telephone Encounter (Signed)
error 

## 2016-06-30 NOTE — Telephone Encounter (Signed)
Called and advised of note from Dr.Kumar, patient had no questions at this time. Dr.Gherghe notified as well.

## 2016-06-30 NOTE — Patient Instructions (Signed)
Please use ondansetron that has been ordered for you for nausea. If symptoms do not continue to improve, worsen, or you develop a fever >100 or cannot hold liquids down, please follow up for further evaluation and treatment.   Viral Gastroenteritis, Adult Introduction Viral gastroenteritis is also known as the stomach flu. This condition is caused by certain germs (viruses). These germs can be passed from person to person very easily (are very contagious). This condition can cause sudden watery poop (diarrhea), fever, and throwing up (vomiting). Having watery poop and throwing up can make you feel weak and cause you to get dehydrated. Dehydration can make you tired and thirsty, make you have a dry mouth, and make it so you pee (urinate) less often. Older adults and people with other diseases or a weak defense system (immune system) are at higher risk for dehydration. It is important to replace the fluids that you lose from having watery poop and throwing up. Follow these instructions at home: Follow instructions from your doctor about how to care for yourself at home. Eating and drinking Follow these instructions as told by your doctor:  Take an oral rehydration solution (ORS). This is a drink that is sold at pharmacies and stores.  Drink clear fluids in small amounts as you are able, such as:  Water.  Ice chips.  Diluted fruit juice.  Low-calorie sports drinks.  Eat bland, easy-to-digest foods in small amounts as you are able, such as:  Bananas.  Applesauce.  Rice.  Low-fat (lean) meats.  Toast.  Crackers.  Avoid fluids that have a lot of sugar or caffeine in them.  Avoid alcohol.  Avoid spicy or fatty foods. General instructions  Drink enough fluid to keep your pee (urine) clear or pale yellow.  Wash your hands often. If you cannot use soap and water, use hand sanitizer.  Make sure that all people in your home wash their hands well and often.  Rest at home while  you get better.  Take over-the-counter and prescription medicines only as told by your doctor.  Watch your condition for any changes.  Take a warm bath to help with any burning or pain from having watery poop.  Keep all follow-up visits as told by your doctor. This is important. Contact a doctor if:  You cannot keep fluids down.  Your symptoms get worse.  You have new symptoms.  You feel light-headed or dizzy.  You have muscle cramps. Get help right away if:  You have chest pain.  You feel very weak or you pass out (faint).  You see blood in your throw-up.  Your throw-up looks like coffee grounds.  You have bloody or black poop (stools) or poop that look like tar.  You have a very bad headache, a stiff neck, or both.  You have a rash.  You have very bad pain, cramping, or bloating in your belly (abdomen).  You have trouble breathing.  You are breathing very quickly.  Your heart is beating very quickly.  Your skin feels cold and clammy.  You feel confused.  You have pain when you pee.  You have signs of dehydration, such as:  Dark pee, hardly any pee, or no pee.  Cracked lips.  Dry mouth.  Sunken eyes.  Sleepiness.  Weakness. This information is not intended to replace advice given to you by your health care provider. Make sure you discuss any questions you have with your health care provider. Document Released: 11/19/2007 Document Revised: 12/21/2015 Document  Reviewed: 02/06/2015  2017 Elsevier

## 2016-06-30 NOTE — Progress Notes (Signed)
Pre visit review using our clinic review tool, if applicable. No additional management support is needed unless otherwise documented below in the visit note. 

## 2016-06-30 NOTE — Progress Notes (Signed)
Subjective:    Patient ID: Chase [name], male    DOB: 1960/09/03, 56 y.o.   MRN: [id number]  HPI  Mr. Zawadzki is a 56 year old male who presents today with nausea and vomiting that started last night about 10pm.  He reports eating late at night and states that symptoms occurred within 5 to 10 minutes after eating macaroni and cheese.  No further episodes of vomiting have occurred. Associated nausea remains with loose stools that occurred throughout the night for at least 4 times. He reports having 2 BMs this morning that were formed and appear "normal" to him now. He denies blood in his stool or vomiting bile. He reports that hemorrhoids are irritated this morning after this episode.  Today he reports drinking small sips of water. He denies fever, chills, sweats, chest pain, palpitations, SOB, jaw/arm pain.  No treatments have been tried at home. No recent travel. He completed cephalexin for a tooth and states that he did not have diarrhea or vomiting during that time.  No recent sick contact exposure. History of T2DM; Blood sugar this morning is noted as 122. He denies episodes of  hypo/hyperglycemia.   Review of Systems  Constitutional: Positive for fatigue. Negative for chills and fever.  Respiratory: Negative for cough, shortness of breath and wheezing.   Cardiovascular: Negative for chest pain and palpitations.  Gastrointestinal: Positive for diarrhea, nausea and vomiting. Negative for abdominal pain.  Genitourinary: Negative for frequency and hematuria.  Musculoskeletal: Negative for myalgias.  Skin: Negative for rash.  Neurological: Negative for dizziness, light-headedness, numbness and headaches.   Past Medical History:  Diagnosis Date  . Allergy   . Arthritis   . Blood in stool   . Brain cyst   . Chest pain   . Constipation   . Diabetes mellitus without complication ([place])   . Difficulty urinating   . GERD (gastroesophageal reflux disease)   . Headache(784.0)   . Hearing  loss   . Hyperlipidemia   . Hypertension   . Leg swelling   . Nasal congestion   . PONV (postoperative nausea and vomiting)   . Rectal pain   . Rectal ulcer with bleeding after hemorrhoid banding 02/11/2015  . Sleep apnea   . Stroke ([place])   . Stroke ([place])   . Thrombosed hemorrhoids   . Trouble swallowing      Social History   Social History  . Marital status: Single    Spouse name: N/A  . Number of children: 0  . Years of education: N/A   Occupational History  . Not on file.   Social History Main Topics  . Smoking status: Never Smoker  . Smokeless tobacco: Never Used  . Alcohol use No     Comment: none  . Drug use: No  . Sexual activity: No   Other Topics Concern  . Not on file   Social History Narrative   No caffeine intake except for chocolate.    Past Surgical History:  Procedure Laterality Date  . CATARACT EXTRACTION     x 3  . [place]   cataracts  . FLEXIBLE SIGMOIDOSCOPY N/A 02/11/2015   Procedure: FLEXIBLE SIGMOIDOSCOPY;  Surgeon: Gatha Mayer, MD;  Location: [facility];  Service: Endoscopy;  Laterality: N/A;  . INNER EAR SURGERY     rt ear  . INNER EAR SURGERY  1992  . surgery - right arm     1983    Family  History  Problem Relation Age of Onset  . Breast cancer Mother 38    breast  . Stroke Father   . Heart disease Father   . Hypertension Father   . Heart attack Father   . Hypertension Brother   . Hyperlipidemia Neg Hx   . Diabetes Neg Hx     Allergies  Allergen Reactions  . Amoxicillin Hives, Itching and Other (See Comments)    White tongue; ? hives  . Flonase [Fluticasone Propionate]     Nasal Sores  . Terfenadine     ? reaction  . Sulfa Antibiotics Rash    Current Outpatient Prescriptions on File Prior to Visit  Medication Sig Dispense Refill  . allopurinol (ZYLOPRIM) 100 MG tablet TAKE 1 TABLET (100 MG TOTAL) BY MOUTH DAILY. LABS ARE DUE NOW 30 tablet 0  . amLODipine (NORVASC) 5 MG tablet TAKE 1  TABLET BY MOUTH DAILY 30 tablet 0  . Azelastine HCl 0.15 % SOLN PLACE 2 SPRAYS IN EACH NOSTRIL every night 30 mL 11  . Blood Glucose Monitoring Suppl (ACCU-CHEK GUIDE) w/Device KIT 1 Device by Does not apply route once. Check blood sugars four times daily 1 kit 0  . [employer] CHILDRENS ASPIRIN 81 MG chewable tablet CHEW 1 TABLET BY MOUTH DAILY. 90 tablet 3  . cyclobenzaprine (FLEXERIL) 10 MG tablet TAKE 1 TABLET (10 MG TOTAL) BY MOUTH AT BEDTIME. 30 tablet 0  . CYCLOSET 0.8 MG TABS Take 2 tablets daily in a.m. with breakfast 60 tablet 3  . diazepam (VALIUM) 5 MG tablet TAKE 1 TABLET THREE TIMES A DAY 90 tablet 0  . empagliflozin (JARDIANCE) 25 MG TABS tablet Take 25 mg by mouth daily. 30 tablet 5  . FIBER SELECT GUMMIES PO Take 1 tablet by mouth daily.    Marland Kitchen glucose blood test strip Check blood sugars four times daily 400 each 12  . HYDROcodone-acetaminophen (HYCET) 7.5-325 mg/15 ml solution TAKE 20 ML EVERY 6 HOURS AS NEEDED FOR PAIN 473 mL 0  . hydrocortisone 2.5 % cream APPLY 2 TIMES DAILY TO GROIN AREA AS NEEDED.  1  . hydrOXYzine (ATARAX/VISTARIL) 25 MG tablet TAKE 1 TABLET BY MOUTH EVERY 8 HOURS AS NEEDED FOR ANXIETY OR ITCHING. 90 tablet 0  . JANUVIA 100 MG tablet TAKE 1 TABLET (100 MG TOTAL) BY MOUTH DAILY. REPEAT LABS ARE DUE NOW 30 tablet 0  . Lancets (ACCU-CHEK SOFT TOUCH) lancets Check blood sugars four times daily 400 each 12  . meclizine (ANTIVERT) 25 MG tablet TAKE 1 TABLET (25 MG TOTAL) BY MOUTH 3 (THREE) TIMES DAILY AS NEEDED. 60 tablet 0  . metoprolol tartrate (LOPRESSOR) 25 MG tablet TAKE 1 TABLET BY MOUTH TWICE A DAY -MAY CRUSH IN FOOD 60 tablet 0  . mometasone (ELOCON) 0.1 % ointment Apply 1 application topically daily as needed (for rash). 45 g 2  . polyethylene glycol powder (GLYCOLAX/MIRALAX) powder TAKE 17 GRAMS BY MOUTH 2 TIMES DAILY AS NEEDED. 527 g 5  . pravastatin (PRAVACHOL) 20 MG tablet Take 1 tablet (20 mg total) by mouth daily. 90 tablet 3  . PROCTOFOAM HC rectal foam  APPLY FOAM THREE TIMES A DAY AS NEEDED FOR 7 DAYS  10  . ranitidine (ZANTAC) 75 MG/5ML syrup TAKE 10 MLS BY MOUTH 2 TIMES DAILY AS NEEDED. FOR INDIGESTION. 300 mL 0  . triamcinolone cream (KENALOG) 0.1 % APPLY TO RASH TWICE A DAY ON CHEST/ARMS/LEGS  0  . venlafaxine XR (EFFEXOR-XR) 37.5 MG 24 hr capsule TAKE 1  CAPSULE (37.5 MG TOTAL) BY MOUTH DAILY. 30 capsule 5   No current facility-administered medications on file prior to visit.     BP 128/90 (BP Location: Left Arm, Patient Position: Sitting, Cuff Size: Normal)   Pulse 88   Temp 98.4 F (36.9 C) (Oral)   Wt 189 lb (85.7 kg)   SpO2 98%   BMI 26.74 kg/m       Objective:   Physical Exam  Constitutional: He is oriented to person, place, and time. He appears well-developed and well-nourished.  HENT:  Right Ear: Tympanic membrane normal.  Left Ear: Tympanic membrane normal.  Mouth/Throat: No oropharyngeal exudate or posterior oropharyngeal erythema.  Eyes: Pupils are equal, round, and reactive to light. No scleral icterus.  Neck: Neck supple.  Cardiovascular: Normal rate, regular rhythm and intact distal pulses.   Pulmonary/Chest: Effort normal and breath sounds normal. He has no wheezes. He has no rales.  Abdominal: Soft. Bowel sounds are normal. He exhibits no distension. There is no tenderness. There is no rebound.  Musculoskeletal: He exhibits no edema.  Lymphadenopathy:    He has no cervical adenopathy.  Neurological: He is alert and oriented to person, place, and time.  Skin: Skin is warm and dry. No rash noted.  Psychiatric: He has a normal mood and affect. His behavior is normal. Judgment and thought content normal.       Assessment & Plan:  1. Gastroenteritis, acute Exam is reassuring. Suspect symptoms are viral in nature. He is reporting that BMs are formed and vomiting has subsided. Mild abdominal discomfort with nausea present and no history of elevated liver enzymes. Advised supportive measures and recommended that  he use his previously prescribed Zofran for nausea. Recommended follow up if symptoms do not continue to improve or worsen in 2 to 3 days or he develops a fever >100.   2. Nausea Sudden onset of nausea. Completed EKG  EKG Interpretation  Date/Time: 06/30/16  15:35 Ventricular Rate: 83 PR Interval: 134 QRS Duration: 95 QT Interval: 388 Text Interpretation: NSR  - EKG 12-Lead  Advised follow up with PCP if symptoms do not continue to improve or he develops new symptoms.  Delano Metz, FNP-C

## 2016-06-30 NOTE — Telephone Encounter (Signed)
Relation to [id number] Call back [phone]   Reason for call:  [facility] due to vomiting and diarrhea x2

## 2016-07-01 ENCOUNTER — Telehealth: Payer: Self-pay

## 2016-07-01 NOTE — Telephone Encounter (Signed)
All nose sprays are otc now except nasonex -- which is alcohol based

## 2016-07-01 NOTE — Telephone Encounter (Signed)
Patient wants to know if there is something else that he can have that is prescription strength that his insurance will cover. Advised patient to check with pharmacy to see what else  they may suggest that is alcohol based.

## 2016-07-01 NOTE — Telephone Encounter (Signed)
Follow up call made to patient regarding his call to [facility] yesterday. Patient states he was seen at [facility] and told he had stomach virus. Placed on medications for his symptoms. States he is feeling a little better today but he hasn't gotten out of bed yet. Patient states he has Azelastine for his nasal congestion because he cannot take Flonase because of the alcohol in it. States this is not working and would like provider to call in a different medication for nasal stuffiness and congestion. Advised  Patient I would forward informatin to his provider and give him a call back.  Advised if his symptoms returned or worsened after 2-3 days of medications to give office a call.

## 2016-07-01 NOTE — Telephone Encounter (Signed)
He can use rhinocort or nasacort --- they are water based and are otc

## 2016-07-03 ENCOUNTER — Other Ambulatory Visit: Payer: Self-pay | Admitting: Family Medicine

## 2016-07-06 ENCOUNTER — Other Ambulatory Visit: Payer: Self-pay | Admitting: Internal Medicine

## 2016-07-06 DIAGNOSIS — E1151 Type 2 diabetes mellitus with diabetic peripheral angiopathy without gangrene: Secondary | ICD-10-CM

## 2016-07-06 DIAGNOSIS — E1165 Type 2 diabetes mellitus with hyperglycemia: Secondary | ICD-10-CM

## 2016-07-06 DIAGNOSIS — IMO0002 Reserved for concepts with insufficient information to code with codable children: Secondary | ICD-10-CM

## 2016-07-10 ENCOUNTER — Other Ambulatory Visit: Payer: Self-pay | Admitting: Family Medicine

## 2016-07-10 DIAGNOSIS — M25511 Pain in right shoulder: Secondary | ICD-10-CM

## 2016-07-15 ENCOUNTER — Telehealth: Payer: Self-pay | Admitting: Nutrition

## 2016-07-15 ENCOUNTER — Other Ambulatory Visit: Payer: Self-pay | Admitting: Endocrinology

## 2016-07-15 ENCOUNTER — Other Ambulatory Visit: Payer: Self-pay | Admitting: Family Medicine

## 2016-07-15 NOTE — Telephone Encounter (Signed)
Appt. Scheduled for 3PM today for CGM.

## 2016-07-17 ENCOUNTER — Other Ambulatory Visit: Payer: Self-pay | Admitting: Family Medicine

## 2016-07-18 ENCOUNTER — Encounter: Payer: Self-pay | Admitting: Family Medicine

## 2016-07-18 ENCOUNTER — Other Ambulatory Visit: Payer: BLUE CROSS/BLUE SHIELD

## 2016-07-22 ENCOUNTER — Encounter: Payer: Self-pay | Admitting: Family Medicine

## 2016-07-22 DIAGNOSIS — Z79891 Long term (current) use of opiate analgesic: Secondary | ICD-10-CM | POA: Diagnosis not present

## 2016-07-22 DIAGNOSIS — Z79899 Other long term (current) drug therapy: Secondary | ICD-10-CM | POA: Diagnosis not present

## 2016-07-23 ENCOUNTER — Other Ambulatory Visit: Payer: Self-pay | Admitting: Family Medicine

## 2016-07-25 ENCOUNTER — Encounter: Payer: Self-pay | Admitting: Family Medicine

## 2016-07-25 ENCOUNTER — Ambulatory Visit (INDEPENDENT_AMBULATORY_CARE_PROVIDER_SITE_OTHER): Payer: BLUE CROSS/BLUE SHIELD | Admitting: Family Medicine

## 2016-07-25 VITALS — BP 126/80 | HR 83 | Temp 98.1°F | Resp 16 | Ht 71.0 in | Wt 187.4 lb

## 2016-07-25 DIAGNOSIS — G43909 Migraine, unspecified, not intractable, without status migrainosus: Secondary | ICD-10-CM

## 2016-07-25 DIAGNOSIS — F411 Generalized anxiety disorder: Secondary | ICD-10-CM

## 2016-07-25 DIAGNOSIS — E785 Hyperlipidemia, unspecified: Secondary | ICD-10-CM | POA: Diagnosis not present

## 2016-07-25 DIAGNOSIS — R413 Other amnesia: Secondary | ICD-10-CM

## 2016-07-25 DIAGNOSIS — J329 Chronic sinusitis, unspecified: Secondary | ICD-10-CM | POA: Diagnosis not present

## 2016-07-25 MED ORDER — CEPHALEXIN 250 MG/5ML PO SUSR: 500.0000 mg | Freq: Two times a day (BID) | ORAL | 0 refills | Status: DC

## 2016-07-25 MED ORDER — DIAZEPAM 5 MG PO TABS: 5.0000 mg | ORAL_TABLET | Freq: Three times a day (TID) | ORAL | 0 refills | Status: DC

## 2016-07-25 NOTE — Progress Notes (Signed)
Subjective:  .sder   Patient ID: Chase [name], male    DOB: 1960-07-19, 56 y.o.   MRN: [id number]  Chief Complaint  Patient presents with  . Follow-up    cholesterol, anxiety,     Sinusitis  This is a new problem. The current episode started in the past 7 days. The problem has been gradually worsening since onset. There has been no fever. His pain is at a severity of 2/10. The pain is mild. Associated symptoms include chills, congestion, coughing and headaches. Past treatments include nothing.   Patient is also in today for follow up cholesterol and anxiety His memory seems to be getting worse.   Pt has not been happy with neuro here ([place]) and would like to see someone else.    Past Medical History:  Diagnosis Date  . Allergy   . Arthritis   . Blood in stool   . Brain cyst   . Chest pain   . Constipation   . Diabetes mellitus without complication ([place])   . Difficulty urinating   . GERD (gastroesophageal reflux disease)   . Headache(784.0)   . Hearing loss   . Hyperlipidemia   . Hypertension   . Leg swelling   . Nasal congestion   . PONV (postoperative nausea and vomiting)   . Rectal pain   . Rectal ulcer with bleeding after hemorrhoid banding 02/11/2015  . Sleep apnea   . Stroke ([place])   . Stroke ([place])   . Thrombosed hemorrhoids   . Trouble swallowing     Past Surgical History:  Procedure Laterality Date  . CATARACT EXTRACTION     x 3  . [place]   cataracts  . FLEXIBLE SIGMOIDOSCOPY N/A 02/11/2015   Procedure: FLEXIBLE SIGMOIDOSCOPY;  Surgeon: Gatha Mayer, MD;  Location: [facility];  Service: Endoscopy;  Laterality: N/A;  . INNER EAR SURGERY     rt ear  . INNER EAR SURGERY  1992  . surgery - right arm     1983    Family History  Problem Relation Age of Onset  . Breast cancer Mother 56    breast  . Stroke Father   . Heart disease Father   . Hypertension Father   . Heart attack Father   . Hypertension Brother   .  Hyperlipidemia Neg Hx   . Diabetes Neg Hx     Social History   Social History  . Marital status: Single    Spouse name: N/A  . Number of children: 0  . Years of education: N/A   Occupational History  . Not on file.   Social History Main Topics  . Smoking status: Never Smoker  . Smokeless tobacco: Never Used  . Alcohol use No     Comment: none  . Drug use: No  . Sexual activity: No   Other Topics Concern  . Not on file   Social History Narrative   No caffeine intake except for chocolate.    Outpatient Medications Prior to Visit  Medication Sig Dispense Refill  . allopurinol (ZYLOPRIM) 100 MG tablet TAKE 1 TABLET (100 MG TOTAL) BY MOUTH DAILY. LABS ARE DUE NOW 30 tablet 0  . amLODipine (NORVASC) 5 MG tablet TAKE 1 TABLET BY MOUTH DAILY 30 tablet 0  . Azelastine HCl 0.15 % SOLN PLACE 2 SPRAYS IN EACH NOSTRIL every night 30 mL 11  . [facility] CH[facility] ASPIRIN 81 MG chewable tablet CHEW 1 TABLET BY MOUTH  DAILY. 90 tablet 3  . cyclobenzaprine (FLEXERIL) 10 MG tablet TAKE 1 TABLET (10 MG TOTAL) BY MOUTH AT BEDTIME. 30 tablet 0  . empagliflozin (JARDIANCE) 25 MG TABS tablet Take 25 mg by mouth daily. 30 tablet 5  . FIBER SELECT GUMMIES PO Take 1 tablet by mouth daily.    [place] Kitchen glucose blood test strip Check blood sugars four times daily 400 each 12  . HYDROcodone-acetaminophen (HYCET) 7.5-325 mg/15 ml solution TAKE 20 ML EVERY 6 HOURS AS NEEDED FOR PAIN 473 mL 0  . hydrocortisone 2.5 % cream APPLY 2 TIMES DAILY TO GROIN AREA AS NEEDED.  1  . hydrOXYzine (ATARAX/VISTARIL) 25 MG tablet TAKE 1 TABLET BY MOUTH EVERY 8 HOURS AS NEEDED FOR ANXIETY OR ITCHING. 90 tablet 0  . JANUVIA 100 MG tablet TAKE 1 TABLET (100 MG TOTAL) BY MOUTH DAILY. REPEAT LABS ARE DUE NOW 30 tablet 0  . Lancets (ACCU-CHEK SOFT TOUCH) lancets Check blood sugars four times daily 400 each 12  . meclizine (ANTIVERT) 25 MG tablet TAKE 1 TABLET (25 MG TOTAL) BY MOUTH 3 (THREE) TIMES DAILY AS NEEDED. 60 tablet 0  . metoprolol  tartrate (LOPRESSOR) 25 MG tablet TAKE 1 TABLET BY MOUTH TWICE A DAY -MAY CRUSH IN FOOD 60 tablet 0  . mometasone (ELOCON) 0.1 % ointment Apply 1 application topically daily as needed (for rash). 45 g 2  . ondansetron (ZOFRAN-ODT) 4 MG disintegrating tablet USE UP TO THREE TIMES A DAY AS NEEDED FOR NAUSEA. 10 tablet 0  . polyethylene glycol powder (GLYCOLAX/MIRALAX) powder TAKE 17 GRAMS BY MOUTH 2 TIMES DAILY AS NEEDED. 527 g 5  . pravastatin (PRAVACHOL) 20 MG tablet Take 1 tablet (20 mg total) by mouth daily. 90 tablet 3  . PROCTOFOAM HC rectal foam APPLY FOAM THREE TIMES A DAY AS NEEDED FOR 7 DAYS  10  . ranitidine (ZANTAC) 75 MG/5ML syrup TAKE 10 MLS BY MOUTH 2 TIMES DAILY AS NEEDED. FOR INDIGESTION. 300 mL 0  . triamcinolone cream (KENALOG) 0.1 % APPLY TO RASH TWICE A DAY ON CHEST/ARMS/LEGS  0  . venlafaxine XR (EFFEXOR-XR) 37.5 MG 24 hr capsule TAKE 1 CAPSULE (37.5 MG TOTAL) BY MOUTH DAILY. 30 capsule 1  . diazepam (VALIUM) 5 MG tablet TAKE 1 TABLET THREE TIMES A DAY 90 tablet 0  . Blood Glucose Monitoring Suppl (ACCU-CHEK GUIDE) w/Device KIT 1 Device by Does not apply route once. Check blood sugars four times daily 1 kit 0  . CYCLOSET 0.8 MG TABS Take 2 tablets daily in a.m. with breakfast 60 tablet 3   No facility-administered medications prior to visit.     Allergies  Allergen Reactions  . Amoxicillin Hives, Itching and Other (See Comments)    White tongue; ? hives  . Flonase [Fluticasone Propionate]     Nasal Sores  . Terfenadine     ? reaction  . Sulfa Antibiotics Rash    Review of Systems  Constitutional: Positive for chills. Negative for fever and malaise/fatigue.  HENT: Positive for congestion and sinus pain.   Respiratory: Positive for cough. Negative for wheezing and stridor.   Cardiovascular: Negative for chest pain and palpitations.  Neurological: Positive for headaches. Negative for dizziness, sensory change, speech change, focal weakness, seizures and loss of  consciousness.  Psychiatric/Behavioral: Positive for memory loss. Negative for depression and suicidal ideas.       Objective:    Physical Exam  Constitutional: He is oriented to person, place, and time. He appears well-developed and well-nourished.  HENT:  Right Ear: External ear normal.  Left Ear: External ear normal.  Nose: Right sinus exhibits maxillary sinus tenderness and frontal sinus tenderness. Left sinus exhibits maxillary sinus tenderness and frontal sinus tenderness.  + PND + errythema  Eyes: Conjunctivae are normal. Right eye exhibits no discharge. Left eye exhibits no discharge.  Cardiovascular: Normal rate, regular rhythm and normal heart sounds.   No murmur heard. Pulmonary/Chest: Effort normal and breath sounds normal. No respiratory distress. He has no wheezes. He has no rales. He exhibits no tenderness.  Musculoskeletal: He exhibits no edema.  Lymphadenopathy:    He has cervical adenopathy.  Neurological: He is alert and oriented to person, place, and time.  Nursing note and vitals reviewed.   BP 126/80 (BP Location: Left Arm, Cuff Size: Normal)   Pulse 83   Temp 98.1 F (36.7 C) (Oral)   Resp 16   Ht 5' 11" (1.803 m)   Wt 187 lb 6.4 oz (85 kg)   SpO2 98%   BMI 26.14 kg/m  Wt Readings from Last 3 Encounters:  07/25/16 187 lb 6.4 oz (85 kg)  06/30/16 189 lb (85.7 kg)  04/18/16 194 lb (88 kg)     Lab Results  Component Value Date   WBC 11.3 (H) 02/23/2015   HGB 11.3 (L) 02/23/2015   HCT 34.5 (L) 02/23/2015   PLT 491.0 (H) 02/23/2015   GLUCOSE 190 (H) 11/13/2015   CHOL 150 11/13/2015   TRIG 134.0 11/13/2015   HDL 30.10 (L) 11/13/2015   LDLDIRECT 148.2 06/03/2013   LDLCALC 93 11/13/2015   ALT 23 11/13/2015   AST 18 11/13/2015   NA 138 11/13/2015   K 3.4 (L) 11/13/2015   CL 102 11/13/2015   CREATININE 1.10 11/13/2015   BUN 11 11/13/2015   CO2 30 11/13/2015   TSH 2.314 02/11/2015   PSA 1.01 10/03/2014   INR 1.11 02/10/2015   HGBA1C 7.4  02/21/2016   MICROALBUR 1.3 11/13/2015    Lab Results  Component Value Date   TSH 2.314 02/11/2015   Lab Results  Component Value Date   WBC 11.3 (H) 02/23/2015   HGB 11.3 (L) 02/23/2015   HCT 34.5 (L) 02/23/2015   MCV 88.0 02/23/2015   PLT 491.0 (H) 02/23/2015   Lab Results  Component Value Date   NA 138 11/13/2015   K 3.4 (L) 11/13/2015   CO2 30 11/13/2015   GLUCOSE 190 (H) 11/13/2015   BUN 11 11/13/2015   CREATININE 1.10 11/13/2015   BILITOT 0.6 11/13/2015   ALKPHOS 79 11/13/2015   AST 18 11/13/2015   ALT 23 11/13/2015   PROT 7.2 11/13/2015   ALBUMIN 4.6 11/13/2015   CALCIUM 9.5 11/13/2015   ANIONGAP 9 02/12/2015   GFR 73.81 11/13/2015   Lab Results  Component Value Date   CHOL 150 11/13/2015   Lab Results  Component Value Date   HDL 30.10 (L) 11/13/2015   Lab Results  Component Value Date   LDLCALC 93 11/13/2015   Lab Results  Component Value Date   TRIG 134.0 11/13/2015   Lab Results  Component Value Date   CHOLHDL 5 11/13/2015   Lab Results  Component Value Date   HGBA1C 7.4 02/21/2016       Assessment & Plan:   Problem List Items Addressed This Visit      Unprioritized   Sinus infection   Relevant Medications   diphenhydrAMINE (BENADRYL) 25 MG tablet   GuaiFENesin 200 MG/10ML SOLN   cephALEXin (KEFLEX)  250 MG/5ML suspension   Hyperlipidemia   Relevant Orders   Comprehensive metabolic panel   Lipid panel    Other Visit Diagnoses    Memory loss    -  Primary   Relevant Orders   Ambulatory referral to Neurology   Generalized anxiety disorder       Migraine without status migrainosus, not intractable, unspecified migraine type       Relevant Medications   ibuprofen (ADVIL) 200 MG tablet   Other Relevant Orders   Ambulatory referral to Neurology      I have discontinued Mr. Andrw Mcguirt. I have also changed his diazepam. Additionally, I am having him start on cephALEXin. Lastly, I am having him maintain his [place] PO, Azelastine HCl, mometasone, pravastatin, meclizine, triamcinolone cream, PROCTOFOAM HC, hydrocortisone, polyethylene glycol powder, [facility] ASPIRIN, empagliflozin, accu-chek soft touch, glucose blood, ACCU-CHEK GUIDE, HYDROcodone-acetaminophen, allopurinol, amLODipine, cyclobenzaprine, hydrOXYzine, metoprolol tartrate, ranitidine, JANUVIA, ondansetron, venlafaxine XR, ibuprofen, diphenhydrAMINE, GuaiFENesin, and ACCU-CHEK GUIDE.  Meds ordered this encounter  Medications  . ibuprofen (ADVIL) 200 MG tablet  . diphenhydrAMINE (BENADRYL) 25 MG tablet  . GuaiFENesin 200 MG/10ML SOLN  . Blood Glucose Monitoring Suppl (ACCU-CHEK GUIDE) w/Device KIT    Sig: See admin instructions.    Refill:  0  . diazepam (VALIUM) 5 MG tablet    Sig: Take 1 tablet (5 mg total) by mouth 3 (three) times daily.    Dispense:  90 tablet    Refill:  0    Not to exceed 5 additional fills before 06/21/2016  . cephALEXin (KEFLEX) 250 MG/5ML suspension    Sig: Take 10 mLs (500 mg total) by mouth 2 (two) times daily.    Dispense:  200 mL    Refill:  0    CMA served as scribe during this visit. History, Physical and Plan performed by medical provider. Documentation and orders reviewed and attested to.  Ann Held, DO

## 2016-07-25 NOTE — Progress Notes (Signed)
Pre visit review using our clinic review tool, if applicable. No additional management support is needed unless otherwise documented below in the visit note. 

## 2016-07-25 NOTE — Patient Instructions (Signed)
Generalized Anxiety Disorder Generalized anxiety disorder (GAD) is a mental disorder. It interferes with life functions, including relationships, work, and school. GAD is different from normal anxiety, which everyone experiences at some point in their lives in response to specific life events and activities. Normal anxiety actually helps us prepare for and get through these life events and activities. Normal anxiety goes away after the event or activity is over.  GAD causes anxiety that is not necessarily related to specific events or activities. It also causes excess anxiety in proportion to specific events or activities. The anxiety associated with GAD is also difficult to control. GAD can vary from mild to severe. People with severe GAD can have intense waves of anxiety with physical symptoms (panic attacks).  SYMPTOMS The anxiety and worry associated with GAD are difficult to control. This anxiety and worry are related to many life events and activities and also occur more days than not for 6 months or longer. People with GAD also have three or more of the following symptoms (one or more in children):  Restlessness.   Fatigue.  Difficulty concentrating.   Irritability.  Muscle tension.  Difficulty sleeping or unsatisfying sleep. DIAGNOSIS GAD is diagnosed through an assessment by your health care provider. Your health care provider will ask you questions aboutyour mood,physical symptoms, and events in your life. Your health care provider may ask you about your medical history and use of alcohol or drugs, including prescription medicines. Your health care provider may also do a physical exam and blood tests. Certain medical conditions and the use of certain substances can cause symptoms similar to those associated with GAD. Your health care provider may refer you to a mental health specialist for further evaluation. TREATMENT The following therapies are usually used to treat GAD:    Medication. Antidepressant medication usually is prescribed for long-term daily control. Antianxiety medicines may be added in severe cases, especially when panic attacks occur.   Talk therapy (psychotherapy). Certain types of talk therapy can be helpful in treating GAD by providing support, education, and guidance. A form of talk therapy called cognitive behavioral therapy can teach you healthy ways to think about and react to daily life events and activities.  Stress managementtechniques. These include yoga, meditation, and exercise and can be very helpful when they are practiced regularly. A mental health specialist can help determine which treatment is best for you. Some people see improvement with one therapy. However, other people require a combination of therapies. This information is not intended to replace advice given to you by your health care provider. Make sure you discuss any questions you have with your health care provider. Document Released: 09/27/2012 Document Revised: 06/23/2014 Document Reviewed: 09/27/2012 Elsevier Interactive Patient Education  2017 Elsevier Inc.  

## 2016-07-27 ENCOUNTER — Encounter: Payer: Self-pay | Admitting: Family Medicine

## 2016-07-28 ENCOUNTER — Encounter: Payer: Self-pay | Admitting: Internal Medicine

## 2016-07-31 ENCOUNTER — Other Ambulatory Visit: Payer: Self-pay | Admitting: Family Medicine

## 2016-07-31 DIAGNOSIS — I10 Essential (primary) hypertension: Secondary | ICD-10-CM

## 2016-07-31 DIAGNOSIS — M25511 Pain in right shoulder: Secondary | ICD-10-CM

## 2016-08-03 ENCOUNTER — Other Ambulatory Visit: Payer: Self-pay | Admitting: Internal Medicine

## 2016-08-03 DIAGNOSIS — E1165 Type 2 diabetes mellitus with hyperglycemia: Secondary | ICD-10-CM

## 2016-08-03 DIAGNOSIS — E1151 Type 2 diabetes mellitus with diabetic peripheral angiopathy without gangrene: Secondary | ICD-10-CM

## 2016-08-03 DIAGNOSIS — IMO0002 Reserved for concepts with insufficient information to code with codable children: Secondary | ICD-10-CM

## 2016-08-07 ENCOUNTER — Telehealth: Payer: Self-pay | Admitting: Nutrition

## 2016-08-07 NOTE — Telephone Encounter (Signed)
Pt. Was contacted by phone on 01/02/17 and came in for new sensor start on 7/21.  He will keep a food record and return in 2 weeks for download

## 2016-08-13 ENCOUNTER — Encounter: Payer: Self-pay | Admitting: Internal Medicine

## 2016-08-20 ENCOUNTER — Encounter: Payer: BLUE CROSS/BLUE SHIELD | Attending: Internal Medicine | Admitting: Nutrition

## 2016-08-20 DIAGNOSIS — E1321 Other specified diabetes mellitus with diabetic nephropathy: Secondary | ICD-10-CM | POA: Diagnosis not present

## 2016-08-20 DIAGNOSIS — Z713 Dietary counseling and surveillance: Secondary | ICD-10-CM | POA: Insufficient documentation

## 2016-08-20 DIAGNOSIS — E1121 Type 2 diabetes mellitus with diabetic nephropathy: Secondary | ICD-10-CM

## 2016-08-21 NOTE — Progress Notes (Signed)
For some reason, the sensor did not work at all while he wore this.  I was not able to download anything from the sensor. We reviewed his diet meal plan, and what he was doing as far as medications and exercise. He is taking Januvial Typical day; Gets up at 10-11AM and eats at 12 noon.  Tests his blood sugars--123-150 Eats his first meal, approx. 30-80 carbs  Has cereal some days-honey nut [place] Tests blood sugar acS and is usually in the 200s.   (He is using 2-3 different meters and takes the lower reading.  Next meal is at 5-7PM of sandwich or hot meal of meat 1-2 starch servings, some desert at times. Has HS snack at [place] and tests blood sugar 1-2 hours after snack:  126-156.  Discussed with him the need to limit carbohydrates at each meal.   1.STOP cold cereal and switch to toast with melted cheese, or peanut butter.  Gave other options as well for him to try which contain 45 grams of carb. 2.  Exercise--encouraged this.  And Discussed how exercise lowers blood sugar.  He agreed to try to walk when the weather breaks.  Encouraged him to start at 15-20 min. And work up to 45 min. 4-5 days/wk.  3.  Stop using different meters.  Told him that each meter works differently, and measures blood sugar differently.  Told him not to compare readings.  Use only one meter and record, until he finishes those brands of test strips, then switch to a different brand. 4.  Test blood sugar 2 hours after a meal.  Told him that 1 hour tells [place] very little about how his body handled that meal.  He agreed to wait 2 hours.

## 2016-08-27 ENCOUNTER — Other Ambulatory Visit: Payer: Self-pay | Admitting: Family Medicine

## 2016-08-27 DIAGNOSIS — I10 Essential (primary) hypertension: Secondary | ICD-10-CM

## 2016-08-27 DIAGNOSIS — M25511 Pain in right shoulder: Secondary | ICD-10-CM

## 2016-08-31 ENCOUNTER — Other Ambulatory Visit: Payer: Self-pay | Admitting: Family Medicine

## 2016-08-31 DIAGNOSIS — M25511 Pain in right shoulder: Secondary | ICD-10-CM

## 2016-09-03 NOTE — Patient Instructions (Signed)
1.STOP cold cereal and switch to toast with melted cheese, or peanut butter.  2.  Exercise--start walking, start at 15-20 min. And work up to 45 min. 4-5 days/wk.  3.  Stop using different meters.  Use only one meter until they are gone, and then switch to a different brand of test strips, then switch to a different brand. 4.  Test blood sugar 2 hours after a meal.

## 2016-09-05 ENCOUNTER — Other Ambulatory Visit: Payer: Self-pay | Admitting: Internal Medicine

## 2016-09-05 DIAGNOSIS — E1151 Type 2 diabetes mellitus with diabetic peripheral angiopathy without gangrene: Secondary | ICD-10-CM

## 2016-09-05 DIAGNOSIS — E1165 Type 2 diabetes mellitus with hyperglycemia: Secondary | ICD-10-CM

## 2016-09-05 DIAGNOSIS — IMO0002 Reserved for concepts with insufficient information to code with codable children: Secondary | ICD-10-CM

## 2016-09-09 DIAGNOSIS — M546 Pain in thoracic spine: Secondary | ICD-10-CM | POA: Diagnosis not present

## 2016-09-09 DIAGNOSIS — M722 Plantar fascial fibromatosis: Secondary | ICD-10-CM | POA: Diagnosis not present

## 2016-09-09 DIAGNOSIS — M542 Cervicalgia: Secondary | ICD-10-CM | POA: Diagnosis not present

## 2016-09-09 DIAGNOSIS — M9903 Segmental and somatic dysfunction of lumbar region: Secondary | ICD-10-CM | POA: Diagnosis not present

## 2016-09-18 DIAGNOSIS — G4733 Obstructive sleep apnea (adult) (pediatric): Secondary | ICD-10-CM | POA: Diagnosis not present

## 2016-09-19 ENCOUNTER — Other Ambulatory Visit: Payer: Self-pay | Admitting: Family Medicine

## 2016-09-26 ENCOUNTER — Other Ambulatory Visit: Payer: Self-pay | Admitting: Family Medicine

## 2016-09-26 DIAGNOSIS — M25511 Pain in right shoulder: Secondary | ICD-10-CM

## 2016-10-24 ENCOUNTER — Other Ambulatory Visit: Payer: Self-pay | Admitting: Family Medicine

## 2016-10-24 DIAGNOSIS — I10 Essential (primary) hypertension: Secondary | ICD-10-CM

## 2016-10-24 DIAGNOSIS — M25511 Pain in right shoulder: Secondary | ICD-10-CM

## 2016-10-28 ENCOUNTER — Other Ambulatory Visit: Payer: Self-pay

## 2016-10-28 ENCOUNTER — Other Ambulatory Visit: Payer: Self-pay | Admitting: Family Medicine

## 2016-10-28 MED ORDER — EMPAGLIFLOZIN 25 MG PO TABS: 25.0000 mg | ORAL_TABLET | Freq: Every day | ORAL | 3 refills | Status: DC

## 2016-10-28 MED ORDER — VENLAFAXINE HCL ER 37.5 MG PO CP24: 37.5000 mg | ORAL_CAPSULE | Freq: Every day | ORAL | 0 refills | Status: DC

## 2016-11-09 ENCOUNTER — Other Ambulatory Visit: Payer: Self-pay | Admitting: Family Medicine

## 2016-11-09 DIAGNOSIS — E785 Hyperlipidemia, unspecified: Secondary | ICD-10-CM

## 2016-11-18 ENCOUNTER — Other Ambulatory Visit: Payer: Self-pay | Admitting: Family Medicine

## 2016-11-18 DIAGNOSIS — M25511 Pain in right shoulder: Secondary | ICD-10-CM

## 2016-11-18 DIAGNOSIS — L259 Unspecified contact dermatitis, unspecified cause: Secondary | ICD-10-CM

## 2016-11-18 NOTE — Telephone Encounter (Signed)
Requesting:   Valium and flexeril Contract     07/18/2016 UDS    Low risk next due on 01/15/2017 Last OV    07/25/2016 Last Refill   Diazepam----#90 on 2/9/201                    Cyclobenzaprine--#30 on 10/24/16  Please Advise

## 2016-11-19 NOTE — Telephone Encounter (Signed)
Print database and send back to me -- thank you

## 2016-11-20 NOTE — Telephone Encounter (Signed)
Actually nothing was in database

## 2016-11-20 NOTE — Telephone Encounter (Signed)
Faxed hard copy of [facility]ium [facility]mg  to [facility] ([phone]).

## 2016-11-20 NOTE — Telephone Encounter (Signed)
Health visitor and on counter for review by PCP

## 2016-12-11 ENCOUNTER — Telehealth: Payer: Self-pay | Admitting: Family Medicine

## 2016-12-11 DIAGNOSIS — M25511 Pain in right shoulder: Secondary | ICD-10-CM

## 2016-12-12 ENCOUNTER — Other Ambulatory Visit: Payer: Self-pay | Admitting: Internal Medicine

## 2016-12-12 DIAGNOSIS — E1165 Type 2 diabetes mellitus with hyperglycemia: Secondary | ICD-10-CM

## 2016-12-12 DIAGNOSIS — E1151 Type 2 diabetes mellitus with diabetic peripheral angiopathy without gangrene: Secondary | ICD-10-CM

## 2016-12-12 DIAGNOSIS — IMO0002 Reserved for concepts with insufficient information to code with codable children: Secondary | ICD-10-CM

## 2016-12-15 DIAGNOSIS — G4733 Obstructive sleep apnea (adult) (pediatric): Secondary | ICD-10-CM | POA: Diagnosis not present

## 2016-12-15 NOTE — Telephone Encounter (Signed)
Ok to refill 

## 2016-12-15 NOTE — Telephone Encounter (Signed)
Pt is requesting refill on hydrocodone 7.5-325mg /82mL solution. [place] Pt.  Last OV: 07/25/2016 Last Fill: 06/27/2016 #462mL and 0RF UDS: 07/18/2016 Low risk  Please advise.

## 2016-12-16 MED ORDER — HYDROCODONE-ACETAMINOPHEN 7.5-325 MG/15ML PO SOLN: ORAL | 0 refills | Status: DC

## 2016-12-16 NOTE — Telephone Encounter (Signed)
Filling for Dr. Etter Sjogren

## 2016-12-16 NOTE — Telephone Encounter (Signed)
Patient made aware of rx in the front ofc.   pC

## 2016-12-19 ENCOUNTER — Other Ambulatory Visit: Payer: Self-pay | Admitting: Family Medicine

## 2016-12-19 ENCOUNTER — Encounter: Payer: Self-pay | Admitting: Family Medicine

## 2016-12-19 DIAGNOSIS — I10 Essential (primary) hypertension: Secondary | ICD-10-CM

## 2016-12-19 DIAGNOSIS — M25511 Pain in right shoulder: Secondary | ICD-10-CM

## 2016-12-19 MED ORDER — ALLOPURINOL 100 MG PO TABS: ORAL_TABLET | ORAL | 3 refills | Status: DC

## 2016-12-19 MED ORDER — AMLODIPINE BESYLATE 5 MG PO TABS: 5.0000 mg | ORAL_TABLET | Freq: Every day | ORAL | 2 refills | Status: DC

## 2016-12-19 MED ORDER — CYCLOBENZAPRINE HCL 10 MG PO TABS: ORAL_TABLET | ORAL | 0 refills | Status: DC

## 2016-12-19 MED ORDER — HYDROXYZINE HCL 25 MG PO TABS: ORAL_TABLET | ORAL | 0 refills | Status: DC

## 2016-12-25 ENCOUNTER — Encounter: Payer: Self-pay | Admitting: Family Medicine

## 2016-12-25 NOTE — Telephone Encounter (Signed)
Please refer to podiatry --[facility]

## 2016-12-26 ENCOUNTER — Other Ambulatory Visit: Payer: Self-pay | Admitting: Family Medicine

## 2016-12-26 DIAGNOSIS — E119 Type 2 diabetes mellitus without complications: Secondary | ICD-10-CM

## 2016-12-26 MED FILL — HYDROCOD-APAP 7.5-325/15ML: 7.5-325 | 6 days supply | Qty: 473 | Fill #0

## 2017-01-08 DIAGNOSIS — M542 Cervicalgia: Secondary | ICD-10-CM | POA: Diagnosis not present

## 2017-01-08 DIAGNOSIS — M9903 Segmental and somatic dysfunction of lumbar region: Secondary | ICD-10-CM | POA: Diagnosis not present

## 2017-01-08 DIAGNOSIS — M722 Plantar fascial fibromatosis: Secondary | ICD-10-CM | POA: Diagnosis not present

## 2017-01-08 DIAGNOSIS — M546 Pain in thoracic spine: Secondary | ICD-10-CM | POA: Diagnosis not present

## 2017-01-13 ENCOUNTER — Telehealth: Payer: Self-pay

## 2017-01-13 DIAGNOSIS — M546 Pain in thoracic spine: Secondary | ICD-10-CM | POA: Diagnosis not present

## 2017-01-13 DIAGNOSIS — M542 Cervicalgia: Secondary | ICD-10-CM | POA: Diagnosis not present

## 2017-01-13 DIAGNOSIS — M9903 Segmental and somatic dysfunction of lumbar region: Secondary | ICD-10-CM | POA: Diagnosis not present

## 2017-01-13 DIAGNOSIS — M722 Plantar fascial fibromatosis: Secondary | ICD-10-CM | POA: Diagnosis not present

## 2017-01-13 NOTE — Telephone Encounter (Signed)
OK to send a PA for Januvia

## 2017-01-13 NOTE — Telephone Encounter (Signed)
I called patient and advised patient that we needed to see him before making any adjustments, you have no appointments opening soon and he is out of medicine tomorrow. Can we switch him to something else until we can get him an appointment??   Also Chase [name], patient states that you had mentioned doing a freestyle libre to him at his last visit and he has not heard back. Can you look into this?  Thank you!

## 2017-01-14 ENCOUNTER — Other Ambulatory Visit: Payer: Self-pay

## 2017-01-14 MED ORDER — FREESTYLE LIBRE SENSOR SYSTEM MISC: 0 refills | Status: DC

## 2017-01-14 NOTE — Telephone Encounter (Signed)
Called patient to see if he wants the home version of Freestyle or a professional [place].  He would prefer a home version..  Note to Almyra Free to order sensors.  I will give him a reader.

## 2017-01-14 NOTE — Telephone Encounter (Signed)
Sent to pharmacy 

## 2017-01-14 NOTE — Telephone Encounter (Signed)
PA faxed for jardiance submitted to [facility]. 01/14/17

## 2017-01-16 ENCOUNTER — Other Ambulatory Visit: Payer: Self-pay | Admitting: Family Medicine

## 2017-01-16 DIAGNOSIS — M25511 Pain in right shoulder: Secondary | ICD-10-CM

## 2017-01-16 NOTE — Telephone Encounter (Signed)
Rxs sent

## 2017-01-16 NOTE — Telephone Encounter (Signed)
Last office visit on 07/25/2016 Last refill for cyclobenzaprine  #30 on 12/19/2016 Last refill for hydroxyzine  #90 on 12/19/2016

## 2017-01-16 NOTE — Telephone Encounter (Signed)
Okay #30 and  #90 as before

## 2017-01-19 ENCOUNTER — Encounter: Payer: Self-pay | Admitting: Internal Medicine

## 2017-01-19 DIAGNOSIS — M9903 Segmental and somatic dysfunction of lumbar region: Secondary | ICD-10-CM | POA: Diagnosis not present

## 2017-01-19 DIAGNOSIS — M546 Pain in thoracic spine: Secondary | ICD-10-CM | POA: Diagnosis not present

## 2017-01-19 DIAGNOSIS — M542 Cervicalgia: Secondary | ICD-10-CM | POA: Diagnosis not present

## 2017-01-19 DIAGNOSIS — M722 Plantar fascial fibromatosis: Secondary | ICD-10-CM | POA: Diagnosis not present

## 2017-01-21 ENCOUNTER — Telehealth: Payer: Self-pay | Admitting: Nutrition

## 2017-01-21 NOTE — Telephone Encounter (Signed)
[name] was shown how to use the [facility] sensor.  He re demonstrated how to load, insert and start the sensor.  We set up his reader, and he attached a sensor to his upper outer right arm with no problems.  He started his sensor and reported good understanding that these readings are not blood glucose readings, that they are estimates.  He is aware that he will need to do a blood sugar when he sees the magifier sign on the reader, or when the arrow is pointing up, or down.  He had no final questions.

## 2017-01-23 ENCOUNTER — Encounter: Payer: Self-pay | Admitting: Internal Medicine

## 2017-01-23 NOTE — Progress Notes (Unsigned)
Patient called back in. Please look at the document submitted at 03:40PM. I verified that the document is clearly viewable.   He is asking that you review and give him a call on his mobile

## 2017-01-26 ENCOUNTER — Other Ambulatory Visit: Payer: Self-pay

## 2017-01-26 MED ORDER — LINAGLIPTIN 5 MG PO TABS: 5.0000 mg | ORAL_TABLET | Freq: Every day | ORAL | 2 refills | Status: DC

## 2017-02-02 ENCOUNTER — Other Ambulatory Visit: Payer: Self-pay

## 2017-02-02 MED ORDER — GLUCOSE BLOOD VI STRP
ORAL_STRIP | 5 refills | Status: DC
Start: 2017-02-02 — End: 2017-09-17

## 2017-02-09 ENCOUNTER — Encounter: Payer: Self-pay | Admitting: Family Medicine

## 2017-02-09 ENCOUNTER — Encounter: Payer: Self-pay | Admitting: Internal Medicine

## 2017-02-09 NOTE — Telephone Encounter (Signed)
Would one of you look at this  Someone may need to talk to him

## 2017-02-11 ENCOUNTER — Other Ambulatory Visit: Payer: Self-pay | Admitting: Internal Medicine

## 2017-02-11 ENCOUNTER — Other Ambulatory Visit: Payer: Self-pay | Admitting: Family Medicine

## 2017-02-11 DIAGNOSIS — M25511 Pain in right shoulder: Secondary | ICD-10-CM

## 2017-02-11 NOTE — Telephone Encounter (Signed)
Caller name: Chase [name]  Relation to pt: Call back number: [phone] Pharmacy: [id number] [place] [address] [place]  Reason for call: Pt is requesting refill on diazepam (VALIUM) 5 MG tablet, pt states pharmacy called him to inform that rx was not approved, pt is needing refill for meds and does not know why it was not approved, please inform pt why and to please send rx to pharmacy, pt also wanting to know if he needs to have any labs done. Please advise.

## 2017-02-11 NOTE — Telephone Encounter (Signed)
Last OV w/ PCP in 07/2016- new federal regulations Pt's must be seen every 3-6 months. Will need to schedule appt w/ PCP for refills- also, Pt overdue for fasting labs. See orders.

## 2017-02-17 ENCOUNTER — Ambulatory Visit: Payer: BLUE CROSS/BLUE SHIELD

## 2017-02-17 DIAGNOSIS — K5904 Chronic idiopathic constipation: Secondary | ICD-10-CM | POA: Diagnosis not present

## 2017-02-17 DIAGNOSIS — E139 Other specified diabetes mellitus without complications: Secondary | ICD-10-CM | POA: Diagnosis not present

## 2017-02-17 DIAGNOSIS — K648 Other hemorrhoids: Secondary | ICD-10-CM | POA: Diagnosis not present

## 2017-02-19 ENCOUNTER — Ambulatory Visit (INDEPENDENT_AMBULATORY_CARE_PROVIDER_SITE_OTHER): Payer: BLUE CROSS/BLUE SHIELD | Admitting: Family Medicine

## 2017-02-19 ENCOUNTER — Encounter: Payer: Self-pay | Admitting: Family Medicine

## 2017-02-19 VITALS — BP 122/72 | HR 80 | Temp 97.7°F | Ht 71.0 in | Wt 188.6 lb

## 2017-02-19 DIAGNOSIS — M791 Myalgia, unspecified site: Secondary | ICD-10-CM

## 2017-02-19 DIAGNOSIS — E118 Type 2 diabetes mellitus with unspecified complications: Secondary | ICD-10-CM

## 2017-02-19 DIAGNOSIS — M2550 Pain in unspecified joint: Secondary | ICD-10-CM

## 2017-02-19 DIAGNOSIS — E1159 Type 2 diabetes mellitus with other circulatory complications: Secondary | ICD-10-CM

## 2017-02-19 DIAGNOSIS — G8929 Other chronic pain: Secondary | ICD-10-CM | POA: Diagnosis not present

## 2017-02-19 DIAGNOSIS — E1165 Type 2 diabetes mellitus with hyperglycemia: Secondary | ICD-10-CM

## 2017-02-19 DIAGNOSIS — E785 Hyperlipidemia, unspecified: Secondary | ICD-10-CM

## 2017-02-19 DIAGNOSIS — L03032 Cellulitis of left toe: Secondary | ICD-10-CM

## 2017-02-19 DIAGNOSIS — M25511 Pain in right shoulder: Secondary | ICD-10-CM

## 2017-02-19 DIAGNOSIS — I7300 Raynaud's syndrome without gangrene: Secondary | ICD-10-CM

## 2017-02-19 DIAGNOSIS — I10 Essential (primary) hypertension: Secondary | ICD-10-CM

## 2017-02-19 MED ORDER — HYDROCODONE-ACETAMINOPHEN 7.5-325 MG/15ML PO SOLN: ORAL | 0 refills | Status: DC

## 2017-02-19 MED ORDER — DIAZEPAM 5 MG PO TABS: 5.0000 mg | ORAL_TABLET | Freq: Three times a day (TID) | ORAL | 0 refills | Status: DC

## 2017-02-19 NOTE — Patient Instructions (Signed)

## 2017-02-19 NOTE — Progress Notes (Signed)
Patient ID: Chase [name], male    DOB: [birth year]  Age: 56 y.o. MRN: [id number]    Subjective:  Subjective  HPI Chase [name] presents for chronic pain all over that worsening -- he has had injections in shoulders and pain just seems to be worsening.  His depression is worsening .  He has pain in his shoulders, back neck , feet legs.  Pain is worsening . He is also c/o worsening raynauds.    Review of Systems  Constitutional: Positive for fatigue. Negative for chills and fever.  HENT: Negative for congestion and hearing loss.   Eyes: Negative for discharge.  Respiratory: Negative for cough and shortness of breath.   Cardiovascular: Negative for chest pain, palpitations and leg swelling.  Gastrointestinal: Negative for abdominal pain, blood in stool, constipation, diarrhea, nausea and vomiting.  Genitourinary: Negative for dysuria, frequency, hematuria and urgency.  Musculoskeletal: Positive for arthralgias, back pain, myalgias, neck pain and neck stiffness.  Skin: Negative for rash.  Allergic/Immunologic: Negative for environmental allergies.  Neurological: Negative for dizziness, weakness and headaches.  Hematological: Does not bruise/bleed easily.  Psychiatric/Behavioral: Positive for decreased concentration and dysphoric mood. Negative for suicidal ideas. The patient is not nervous/anxious.     History Past Medical History:  Diagnosis Date  . Allergy   . Arthritis   . Blood in stool   . Brain cyst   . Chest pain   . Constipation   . Diabetes mellitus without complication ([place])   . Difficulty urinating   . GERD (gastroesophageal reflux disease)   . Headache(784.0)   . Hearing loss   . Hyperlipidemia   . Hypertension   . Leg swelling   . Nasal congestion   . PONV (postoperative nausea and vomiting)   . Rectal pain   . Rectal ulcer with bleeding after hemorrhoid banding 02/11/2015  . Sleep apnea   . Stroke ([place])   . Stroke ([place])   . Thrombosed hemorrhoids   . Trouble  swallowing     He has a past surgical history that includes Cataract extraction; Inner ear surgery; Eye surgery ([place]); Inner ear surgery (1992); surgery - right arm; and Flexible sigmoidoscopy (N/A, 02/11/2015).   His family history includes Breast cancer (age of onset: 71) in his mother; Heart attack in his father; Heart disease in his father; Hypertension in his brother and father; Stroke in his father.He reports that he has never smoked. He has never used smokeless tobacco. He reports that he does not drink alcohol or use drugs.  Current Outpatient Prescriptions on File Prior to Visit  Medication Sig Dispense Refill  . allopurinol (ZYLOPRIM) 100 MG tablet TAKE 1 TABLET BY MOUTH DAILY 30 tablet 3  . amLODipine (NORVASC) 5 MG tablet Take 1 tablet (5 mg total) by mouth daily. 30 tablet 2  . Azelastine HCl 0.15 % SOLN PLACE 2 SPRAYS IN EACH NOSTRIL every night 30 mL 11  . Blood Glucose Monitoring Suppl (ACCU-CHEK GUIDE) w/Device KIT See admin instructions.  0  . Continuous Blood Gluc Sensor (FREESTYLE LIBRE SENSOR SYSTEM) MISC Change out sensor every 10 days. 9 each 0  . cyclobenzaprine (FLEXERIL) 10 MG tablet Take 1 tablet (10 mg total) by mouth at bedtime as needed for muscle spasms. 30 tablet 0  . diphenhydrAMINE (BENADRYL) 25 MG tablet     . empagliflozin (JARDIANCE) 25 MG TABS tablet Take 25 mg by mouth daily. 90 tablet 3  . FIBER SELECT GUMMIES PO Take 1 tablet by mouth  daily.    . glucose blood (FREESTYLE PRECISION NEO TEST) test strip Use as instructed to check sugar 4-5 times daily 500 each 5  . glucose blood test strip Check blood sugars four times daily 400 each 12  . GuaiFENesin 200 MG/10ML SOLN     . hydrocortisone 2.5 % cream APPLY 2 TIMES DAILY TO GROIN AREA AS NEEDED.  1  . hydrOXYzine (ATARAX/VISTARIL) 25 MG tablet Take 1 tablet (25 mg total) by mouth every 8 (eight) hours as needed for anxiety or itching. 90 tablet 0  . ibuprofen (ADVIL) 200 MG tablet     .  Lancets (ACCU-CHEK SOFT TOUCH) lancets Check blood sugars four times daily 400 each 12  . meclizine (ANTIVERT) 25 MG tablet TAKE 1 TABLET (25 MG TOTAL) BY MOUTH 3 (THREE) TIMES DAILY AS NEEDED. 60 tablet 0  . metoprolol tartrate (LOPRESSOR) 25 MG tablet TAKE 1 TABLET BY MOUTH TWICE A DAY -MAY CRUSH IN FOOD 60 tablet 4  . mometasone (ELOCON) 0.1 % ointment APPLY TO AFFECTED AREA EVERY DAY AS NEEDED FOR RASH 45 g 0  . ondansetron (ZOFRAN-ODT) 4 MG disintegrating tablet USE UP TO THREE TIMES A DAY AS NEEDED FOR NAUSEA. 10 tablet 0  . polyethylene glycol powder (GLYCOLAX/MIRALAX) powder TAKE 17 GRAMS BY MOUTH 2 TIMES DAILY AS NEEDED. 527 g 5  . pravastatin (PRAVACHOL) 20 MG tablet TAKE 1 TABLET BY MOUTH DAILY 90 tablet 3  . PROCTOFOAM HC rectal foam APPLY FOAM THREE TIMES A DAY AS NEEDED FOR 7 DAYS  10  . ranitidine (ZANTAC) 75 MG/5ML syrup TAKE 10 MLS BY MOUTH 2 TIMES DAILY AS NEEDED. FOR INDIGESTION. 300 mL 0  . triamcinolone cream (KENALOG) 0.1 % APPLY TO RASH TWICE A DAY ON CHEST/ARMS/LEGS  0  . venlafaxine XR (EFFEXOR-XR) 37.5 MG 24 hr capsule Take 1 capsule (37.5 mg total) by mouth daily with breakfast. 90 capsule 0  . Blood Glucose Monitoring Suppl (ACCU-CHEK GUIDE) w/Device KIT 1 Device by Does not apply route once. Check blood sugars four times daily 1 kit 0  . [facility] ASPIRIN 81 MG chewable tablet CHEW 1 TABLET BY MOUTH DAILY. (Patient not taking: Reported on 02/19/2017) 90 tablet 3  . JANUVIA 100 MG tablet TAKE 1 TABLET (100 MG TOTAL) BY MOUTH DAILY. REPEAT LABS ARE DUE NOW (Patient not taking: Reported on 02/19/2017) 30 tablet 2  . linagliptin (TRADJENTA) 5 MG TABS tablet Take 1 tablet (5 mg total) by mouth daily. (Patient not taking: Reported on 02/19/2017) 30 tablet 2   No current facility-administered medications on file prior to visit.      Objective:  Objective  Physical Exam  Constitutional: He is oriented to person, place, and time. Vital signs are normal. He appears  well-developed and well-nourished. He is sleeping.  HENT:  Head: Normocephalic and atraumatic.  Mouth/Throat: Oropharynx is clear and moist.  Eyes: Pupils are equal, round, and reactive to light. EOM are normal.  Neck: Normal range of motion. Neck supple. No thyromegaly present.  Cardiovascular: Normal rate and regular rhythm.   No murmur heard. Pulmonary/Chest: Effort normal and breath sounds normal. No respiratory distress. He has no wheezes. He has no rales. He exhibits no tenderness.  Musculoskeletal: He exhibits tenderness. He exhibits no edema.  Neurological: He is alert and oriented to person, place, and time.  Skin: Skin is warm and dry.  Psychiatric: He has a normal mood and affect. His behavior is normal. Judgment and thought content normal.  Nursing note  and vitals reviewed.  BP 122/72 (BP Location: Left Arm, Patient Position: Sitting, Cuff Size: Normal)   Pulse 80   Temp 97.7 F (36.5 C) (Oral)   Ht '5\' 11"'$  (1.803 m)   Wt 188 lb 9.6 oz (85.5 kg)   SpO2 97%   BMI 26.30 kg/m  Wt Readings from Last 3 Encounters:  02/19/17 188 lb 9.6 oz (85.5 kg)  07/25/16 187 lb 6.4 oz (85 kg)  06/30/16 189 lb (85.7 kg)     Lab Results  Component Value Date   WBC 11.3 (H) 02/23/2015   HGB 11.3 (L) 02/23/2015   HCT 34.5 (L) 02/23/2015   PLT 491.0 (H) 02/23/2015   GLUCOSE 190 (H) 11/13/2015   CHOL 150 11/13/2015   TRIG 134.0 11/13/2015   HDL 30.10 (L) 11/13/2015   LDLDIRECT 148.2 06/03/2013   LDLCALC 93 11/13/2015   ALT 23 11/13/2015   AST 18 11/13/2015   NA 138 11/13/2015   K 3.4 (L) 11/13/2015   CL 102 11/13/2015   CREATININE 1.10 11/13/2015   BUN 11 11/13/2015   CO2 30 11/13/2015   TSH 2.314 02/11/2015   PSA 1.01 10/03/2014   INR 1.11 02/10/2015   HGBA1C 7.4 02/21/2016   MICROALBUR 1.3 11/13/2015    US Venous Img Upper Uni Right  Result Date: 02/14/2015 CLINICAL DATA:  Right arm and neck pain, recent hospitalization EXAM: Right UPPER EXTREMITY VENOUS DOPPLER  ULTRASOUND TECHNIQUE: Gray-scale sonography with graded compression, as well as color Doppler and duplex ultrasound were performed to evaluate the upper extremity deep venous system from the level of the subclavian vein and including the jugular, axillary, basilic, radial, ulnar and upper cephalic vein. Spectral Doppler was utilized to evaluate flow at rest and with distal augmentation maneuvers. COMPARISON:  None. FINDINGS: Contralateral Subclavian Vein: Respiratory phasicity is normal and symmetric with the symptomatic side. No evidence of thrombus. Normal compressibility. Internal Jugular Vein: No evidence of thrombus. Normal compressibility, respiratory phasicity and response to augmentation. Subclavian Vein: No evidence of thrombus. Normal compressibility, respiratory phasicity and response to augmentation. Axillary Vein: No evidence of thrombus. Normal compressibility, respiratory phasicity and response to augmentation. Cephalic Vein: No evidence of thrombus. Normal compressibility, respiratory phasicity and response to augmentation. Basilic Vein: No evidence of thrombus. Normal compressibility, respiratory phasicity and response to augmentation. Brachial Veins: No evidence of thrombus. Normal compressibility, respiratory phasicity and response to augmentation. Radial Veins: No evidence of thrombus. Normal compressibility, respiratory phasicity and response to augmentation. Ulnar Veins: No evidence of thrombus. Normal compressibility, respiratory phasicity and response to augmentation. Venous Reflux:  None visualized. Other Findings:  None visualized. IMPRESSION: No evidence of deep venous thrombosis. Electronically Signed   By: Conchita Paris M.D.   On: 02/14/2015 16:30     Assessment & Plan:  Plan  I have discontinued Mr. Tupy cephALEXin. I have also changed his diazepam. Additionally, I am having him maintain his FIBER SELECT GUMMIES PO, Azelastine HCl, meclizine, triamcinolone cream, PROCTOFOAM HC,  hydrocortisone, [facility] ASPIRIN, accu-chek soft touch, glucose blood, ACCU-CHEK GUIDE, ranitidine, ondansetron, ibuprofen, diphenhydrAMINE, GuaiFENesin, ACCU-CHEK GUIDE, metoprolol tartrate, polyethylene glycol powder, empagliflozin, pravastatin, mometasone, JANUVIA, allopurinol, amLODipine, FREESTYLE LIBRE SENSOR SYSTEM, linagliptin, glucose blood, venlafaxine XR, hydrOXYzine, cyclobenzaprine, and HYDROcodone-acetaminophen.  Meds ordered this encounter  Medications  . HYDROcodone-acetaminophen (HYCET) 7.5-325 mg/15 ml solution    Sig: TAKE 20 ML EVERY 6 HOURS AS NEEDED FOR PAIN    Dispense:  473 mL    Refill:  0  . diazepam (VALIUM) 5 MG  tablet    Sig: Take 1 tablet (5 mg total) by mouth 3 (three) times daily.    Dispense:  90 tablet    Refill:  0    Not to exceed 5 additional fills before 01/21/2017    Problem List Items Addressed This Visit      Unprioritized   Arthralgia   Relevant Medications   HYDROcodone-acetaminophen (HYCET) 7.5-325 mg/15 ml solution   diazepam (VALIUM) 5 MG tablet   Other Relevant Orders   Ambulatory referral to Rheumatology   Lipid panel   CBC with Differential/Platelet   Comprehensive metabolic panel   TSH   Vitamin D 1,25 dihydroxy   Antinuclear Antib (ANA)   Rheumatoid Factor   HTN (hypertension)   Relevant Orders   CBC with Differential/Platelet   Comprehensive metabolic panel    Other Visit Diagnoses    Myalgia    -  Primary   Relevant Orders   Ambulatory referral to Rheumatology   Lipid panel   CBC with Differential/Platelet   Comprehensive metabolic panel   TSH   Vitamin D 1,25 dihydroxy   Antinuclear Antib (ANA)   Rheumatoid Factor   Raynaud's disease without gangrene       Relevant Orders   Ambulatory referral to Rheumatology   Lipid panel   CBC with Differential/Platelet   Comprehensive metabolic panel   TSH   Vitamin D 1,25 dihydroxy   Antinuclear Antib (ANA)   Rheumatoid Factor   Hyperlipidemia LDL goal <70        Relevant Orders   Lipid panel   Comprehensive metabolic panel   Controlled diabetes mellitus type 2 with complications, unspecified whether long term insulin use (HCC)       Relevant Orders   Comprehensive metabolic panel   Hemoglobin A1c   Other chronic pain       Relevant Medications   HYDROcodone-acetaminophen (HYCET) 7.5-325 mg/15 ml solution   diazepam (VALIUM) 5 MG tablet      Follow-up: Return in about 6 months (around 08/19/2017), or if symptoms worsen or fail to improve.  Ann Held, DO

## 2017-02-20 LAB — COMPREHENSIVE METABOLIC PANEL
ALT: 18 U/L (ref 0–53)
AST: 17 U/L (ref 0–37)
Albumin: 4.5 g/dL (ref 3.5–5.2)
Alkaline Phosphatase: 76 U/L (ref 39–117)
BUN: 13 mg/dL (ref 6–23)
CO2: 29 mEq/L (ref 19–32)
Calcium: 9.3 mg/dL (ref 8.4–10.5)
Chloride: 101 mEq/L (ref 96–112)
Creatinine, Ser: 1.08 mg/dL (ref 0.40–1.50)
GFR: 75.04 mL/min (ref >60.00)
Glucose, Bld: 98 mg/dL (ref 70–99)
Potassium: 3.7 mEq/L (ref 3.5–5.1)
Sodium: 140 mEq/L (ref 135–145)
Total Bilirubin: 0.8 mg/dL (ref 0.2–1.2)
Total Protein: 7.3 g/dL (ref 6.0–8.3)

## 2017-02-20 LAB — CBC WITH DIFFERENTIAL/PLATELET
Basophils Absolute: 0.1 10*3/uL (ref 0.0–0.1)
Basophils Relative: 0.9 % (ref 0.0–3.0)
Eosinophils Absolute: 0.2 10*3/uL (ref 0.0–0.7)
Eosinophils Relative: 2.7 % (ref 0.0–5.0)
HCT: 44.9 % (ref 39.0–52.0)
Hemoglobin: 15 g/dL (ref 13.0–17.0)
Lymphocytes Relative: 21.9 % (ref 12.0–46.0)
Lymphs Abs: 2 10*3/uL (ref 0.7–4.0)
MCHC: 33.4 g/dL (ref 30.0–36.0)
MCV: 89.3 fl (ref 78.0–100.0)
Monocytes Absolute: 0.5 10*3/uL (ref 0.1–1.0)
Monocytes Relative: 6 % (ref 3.0–12.0)
Neutro Abs: 6.1 10*3/uL (ref 1.4–7.7)
Neutrophils Relative %: 68.5 % (ref 43.0–77.0)
Platelets: 337 10*3/uL (ref 150.0–400.0)
RBC: 5.03 Mil/uL (ref 4.22–5.81)
RDW: 14.3 % (ref 11.5–15.5)
WBC: 9 10*3/uL (ref 4.0–10.5)

## 2017-02-20 LAB — LIPID PANEL
Cholesterol: 163 mg/dL (ref 0–200)
HDL: 32.8 mg/dL — ABNORMAL LOW (ref >39.00)
LDL Cholesterol: 100 mg/dL — ABNORMAL HIGH (ref 0–99)
NonHDL: 129.94
Total CHOL/HDL Ratio: 5
Triglycerides: 151 mg/dL — ABNORMAL HIGH (ref 0.0–149.0)
VLDL: 30.2 mg/dL (ref 0.0–40.0)

## 2017-02-20 LAB — HEMOGLOBIN A1C: Hgb A1c MFr Bld: 7.1 % — ABNORMAL HIGH (ref 4.6–6.5)

## 2017-02-20 LAB — TSH: TSH: 2.89 u[IU]/mL (ref 0.35–4.50)

## 2017-02-21 DIAGNOSIS — M25511 Pain in right shoulder: Secondary | ICD-10-CM | POA: Insufficient documentation

## 2017-02-21 HISTORY — DX: Pain in right shoulder: M25.511

## 2017-02-21 NOTE — Assessment & Plan Note (Signed)
Well controlled, no changes to meds. Encouraged heart healthy diet such as the DASH diet and exercise as tolerated.  °

## 2017-02-21 NOTE — Assessment & Plan Note (Signed)
Tolerating statin, encouraged heart healthy diet, avoid trans fats, minimize simple carbs and saturated fats. Increase exercise as tolerated 

## 2017-02-21 NOTE — Assessment & Plan Note (Signed)
con''t f/u endo

## 2017-02-24 LAB — VITAMIN D 1,25 DIHYDROXY
Vitamin D 1, 25 (OH)2 Total: 50 pg/mL (ref 18–72)
Vitamin D2 1, 25 (OH)2: 17 pg/mL
Vitamin D3 1, 25 (OH)2: 33 pg/mL

## 2017-02-24 LAB — ANA: Anti Nuclear Antibody(ANA): NEGATIVE

## 2017-02-24 LAB — RHEUMATOID FACTOR: Rhuematoid fact SerPl-aCnc: <14 IU/mL (ref <14)

## 2017-02-25 ENCOUNTER — Telehealth: Payer: Self-pay

## 2017-02-25 DIAGNOSIS — E785 Hyperlipidemia, unspecified: Secondary | ICD-10-CM

## 2017-02-25 NOTE — Telephone Encounter (Signed)
-----   Message from Ann Held, DO sent at 02/22/2017  3:21 PM EDT ----- Cholesterol--- LDL goal < 70,  HDL >40,  TG < 150.  Diet and exercise will increase HDL and decrease LDL and TG.  Fish,  Fish Oil, Flaxseed oil will also help increase the HDL and decrease Triglycerides.   Recheck labs in 3 months Increase pravachol to 40 mg #30  1 po qhs, 2 refills  Lipid, cmp Dm controlled .

## 2017-02-25 NOTE — Telephone Encounter (Signed)
YL-I informed pt of lab results and discussed the need to increase the Pravachol/he then told me that at the time that the labs were drawn he had been without it for at least 2 weeks  Do you want him to either resume Pravachol at 20mg  1qhs dose or do you still advise him to increase to 40mg  1qhs? Plz advise/thx dmf

## 2017-02-25 NOTE — Telephone Encounter (Signed)
Pt was seen by Dr. Etter Sjogren on 02/19/17.

## 2017-02-26 MED ORDER — PRAVASTATIN SODIUM 20 MG PO TABS: 20.0000 mg | ORAL_TABLET | Freq: Every day | ORAL | 0 refills | Status: DC

## 2017-02-26 NOTE — Telephone Encounter (Signed)
Pt aware to restart Pravachol at 20mg  [facility]/he will have lab drawn in 3 months/created future order/thx dmf

## 2017-02-26 NOTE — Telephone Encounter (Signed)
Just start back on it and repeat in 3 months

## 2017-03-02 ENCOUNTER — Ambulatory Visit (INDEPENDENT_AMBULATORY_CARE_PROVIDER_SITE_OTHER): Payer: BLUE CROSS/BLUE SHIELD | Admitting: Podiatry

## 2017-03-02 ENCOUNTER — Ambulatory Visit: Payer: BLUE CROSS/BLUE SHIELD

## 2017-03-02 ENCOUNTER — Encounter: Payer: Self-pay | Admitting: Podiatry

## 2017-03-02 DIAGNOSIS — M79671 Pain in right foot: Secondary | ICD-10-CM

## 2017-03-02 DIAGNOSIS — M79604 Pain in right leg: Secondary | ICD-10-CM | POA: Insufficient documentation

## 2017-03-02 DIAGNOSIS — G8929 Other chronic pain: Secondary | ICD-10-CM | POA: Insufficient documentation

## 2017-03-02 DIAGNOSIS — R2243 Localized swelling, mass and lump, lower limb, bilateral: Secondary | ICD-10-CM

## 2017-03-02 DIAGNOSIS — M79674 Pain in right toe(s): Secondary | ICD-10-CM | POA: Diagnosis not present

## 2017-03-02 DIAGNOSIS — M79675 Pain in left toe(s): Secondary | ICD-10-CM

## 2017-03-02 DIAGNOSIS — M79605 Pain in left leg: Secondary | ICD-10-CM

## 2017-03-02 DIAGNOSIS — L600 Ingrowing nail: Secondary | ICD-10-CM | POA: Diagnosis not present

## 2017-03-02 DIAGNOSIS — I7300 Raynaud's syndrome without gangrene: Secondary | ICD-10-CM

## 2017-03-02 DIAGNOSIS — M542 Cervicalgia: Secondary | ICD-10-CM

## 2017-03-02 DIAGNOSIS — M797 Fibromyalgia: Secondary | ICD-10-CM

## 2017-03-02 DIAGNOSIS — M79672 Pain in left foot: Secondary | ICD-10-CM

## 2017-03-02 HISTORY — DX: Localized swelling, mass and lump, lower limb, bilateral: R22.43

## 2017-03-02 HISTORY — DX: Raynaud's syndrome without gangrene: I73.00

## 2017-03-02 HISTORY — DX: Other chronic pain: G89.29

## 2017-03-02 HISTORY — DX: Pain in right leg: M79.604

## 2017-03-02 HISTORY — DX: Pain in right foot: M79.671

## 2017-03-02 HISTORY — DX: Fibromyalgia: M79.7

## 2017-03-02 NOTE — Patient Instructions (Signed)

## 2017-03-04 NOTE — Progress Notes (Signed)
   Subjective: Patient with PMHx of T2DM presents today for evaluation of pain in the lateral border of the left great toe that began about 2 months ago. He also reports pain to the lateral border of the second left toenail. Patient is concerned for possible ingrown nails. Patient presents today for further treatment and evaluation.  Past Medical History:  Diagnosis Date  . Allergy   . Arthritis   . Blood in stool   . Brain cyst   . Chest pain   . Constipation   . Diabetes mellitus without complication ([place])   . Difficulty urinating   . GERD (gastroesophageal reflux disease)   . Headache(784.0)   . Hearing loss   . Hyperlipidemia   . Hypertension   . Leg swelling   . Nasal congestion   . PONV (postoperative nausea and vomiting)   . Rectal pain   . Rectal ulcer with bleeding after hemorrhoid banding 02/11/2015  . Sleep apnea   . Stroke ([place])   . Stroke ([place])   . Thrombosed hemorrhoids   . Trouble swallowing     Objective:  General: Well developed, nourished, in no acute distress, alert and oriented x3   Dermatology: Skin is warm, dry and supple bilateral. Lateral border of the left great toe appears to be erythematous with evidence of an ingrowing nail. Pain on palpation noted to the border of the nail fold. The remaining nails appear unremarkable at this time. There are no open sores, lesions.  Vascular: Dorsalis Pedis artery and Posterior Tibial artery pedal pulses palpable. No lower extremity edema noted.   Neruologic: Grossly intact via light touch bilateral.  Musculoskeletal: Muscular strength within normal limits in all groups bilateral. Normal range of motion noted to all pedal and ankle joints.   Assesement: #1 Paronychia with ingrowing nail lateral border of the left great toe #2 Pain in toe #3 Incurvated nail  Plan of Care:  1. Patient evaluated.  2. Discussed treatment alternatives and plan of care. Explained nail avulsion procedure and post procedure course  to patient. 3. Patient opted for permanent partial nail avulsion.  4. Prior to procedure, local anesthesia infiltration utilized using 3 ml of a 50:50 mixture of 2% plain lidocaine and 0.5% plain marcaine in a normal hallux block fashion and a betadine prep performed.  5. Partial permanent nail avulsion with chemical matrixectomy performed using [id number] applications of phenol followed by alcohol flush.  6. Light dressing applied. 7. Return to clinic in 2 weeks to address possible ingrown on right great toe.   Edrick Kins, DPM [facility]  Dr. Edrick Kins, [address]                                        [place], [place] [address]                Office [phone]  Fax [phone]

## 2017-03-13 ENCOUNTER — Encounter: Payer: Self-pay | Admitting: Family Medicine

## 2017-03-13 ENCOUNTER — Encounter: Payer: Self-pay | Admitting: Internal Medicine

## 2017-03-16 ENCOUNTER — Ambulatory Visit (INDEPENDENT_AMBULATORY_CARE_PROVIDER_SITE_OTHER): Payer: BLUE CROSS/BLUE SHIELD | Admitting: Podiatry

## 2017-03-16 DIAGNOSIS — L600 Ingrowing nail: Secondary | ICD-10-CM

## 2017-03-16 MED ORDER — DOXYCYCLINE HYCLATE 100 MG PO TABS: 100.0000 mg | ORAL_TABLET | Freq: Two times a day (BID) | ORAL | 0 refills | Status: DC

## 2017-03-16 MED ORDER — GENTAMICIN SULFATE 0.1 % EX CREA: 1.0000 "application " | TOPICAL_CREAM | Freq: Three times a day (TID) | CUTANEOUS | 1 refills | Status: DC

## 2017-03-16 NOTE — Progress Notes (Signed)
   Subjective: Patient presents today 2 weeks post ingrown nail permanent nail avulsion procedure. Patient states that the toe and nail fold is feeling much better. There is exquisite pain on palpation noted to the nail avulsion site.  Past Medical History:  Diagnosis Date  . Allergy   . Arthritis   . Blood in stool   . Brain cyst   . Chest pain   . Constipation   . Diabetes mellitus without complication ([place])   . Difficulty urinating   . GERD (gastroesophageal reflux disease)   . Headache(784.0)   . Hearing loss   . Hyperlipidemia   . Hypertension   . Leg swelling   . Nasal congestion   . PONV (postoperative nausea and vomiting)   . Rectal pain   . Rectal ulcer with bleeding after hemorrhoid banding 02/11/2015  . Sleep apnea   . Stroke ([place])   . Stroke ([place])   . Thrombosed hemorrhoids   . Trouble swallowing     Objective: Skin is warm, dry and supple. Nail and respective nail fold appears to be healing appropriately. Open wound to the associated nail fold with a granular wound base and moderate amount of fibrotic tissue. Minimal drainage noted. Mild erythema around the periungual region likely due to phenol chemical matricectomy.  Assessment: #1 postop permanent partial nail avulsion Left great toe lateral border #2 open wound periungual nail fold of respective digit.   Plan of care: #1 patient was evaluated  #2 debridement of open wound was performed to the periungual border of the respective toe using a currette. Antibiotic ointment and Band-Aid was applied. #3 Prescription for doxycycline 100 mg #20  #4 prescription for gentamicin cream to be applied daily  #5 return to clinic in 2 weeks   Edrick Kins, DPM [facility]  Dr. Edrick Kins, [address]                                        [place], [place] [address]                Office [phone]  Fax [phone]

## 2017-03-18 ENCOUNTER — Encounter: Payer: Self-pay | Admitting: Internal Medicine

## 2017-03-18 ENCOUNTER — Ambulatory Visit (INDEPENDENT_AMBULATORY_CARE_PROVIDER_SITE_OTHER): Payer: BLUE CROSS/BLUE SHIELD | Admitting: Internal Medicine

## 2017-03-18 VITALS — BP 124/78 | HR 84 | Temp 97.9°F | Wt 187.0 lb

## 2017-03-18 DIAGNOSIS — E785 Hyperlipidemia, unspecified: Secondary | ICD-10-CM

## 2017-03-18 DIAGNOSIS — E1165 Type 2 diabetes mellitus with hyperglycemia: Secondary | ICD-10-CM

## 2017-03-18 DIAGNOSIS — E1159 Type 2 diabetes mellitus with other circulatory complications: Secondary | ICD-10-CM

## 2017-03-18 NOTE — Progress Notes (Signed)
Patient ID: Chase [name], male   DOB: 31-May-1961, 56 y.o.   MRN: [id number]  HPI: Chase [name] is a 56 y.o.-year-old male, returning for follow-up for DM2, dx in 2014, non-insulin-dependent, uncontrolled, with complications (PVD, cerebro-vascular ds - s/p "mini"strokes, PN). Last visit 11 mo ago  Last hemoglobin A1c was: Lab Results  Component Value Date   HGBA1C 7.1 (H) 02/19/2017   HGBA1C 7.4 02/21/2016   HGBA1C 9.8 (H) 11/13/2015   Pt is on a regimen of: - Januvia 100 mg daily in am - started 10/2015 >> he was off 2/2 insurance coverage >> they finally got this >> did not start yet. - Cycloset 1.6 mg daily in am >> Jardiance 25 mg daily in am - started 04/2016 Stopped Glipizide b/c rash. Stopped Invokana 100 mg in am >> only got this for 1 mo He was on Metformin 500 mg po bid - in liquid form, as he could not swallow pills. He gets gi upset. He stopped this in 2014.  Pt checks his sugars 1-3x a day: - fasting: ave 140 -150s >> brunch:  152-194 >>  - 2h after lunch: n/c >> ave 158 >> 312 - before dinner: 130 >> ave 127 >> 216 - 2h after dinner: n/c >> ave 183 >> 191-273 - bedtime: 172-214 >> 208 - nighttime: n/c Lowest sugar was  152 >> 90s; he has hypoglycemia awareness at 70s  Highest sugar was  300s >> 250s after he eats out.  Glucometer: [employer] Next  - + mild CKD, last BUN/creatinine:  Lab Results  Component Value Date   BUN 13 02/19/2017   BUN 11 11/13/2015   CREATININE 1.08 02/19/2017   CREATININE 1.10 11/13/2015   - + HL.  last set of lipids: Lab Results  Component Value Date   CHOL 163 02/19/2017   HDL 32.80 (L) 02/19/2017   LDLCALC 100 (H) 02/19/2017   LDLDIRECT 148.2 06/03/2013   TRIG 151.0 (H) 02/19/2017   CHOLHDL 5 02/19/2017  On Pravastatin 20. 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 He has anxiety, PTSD from childhood. Has varicose veins >> wears compresion hoses. He also has a h/o kidney stones.   ROS: Constitutional: no weight gain/no weight loss, no fatigue, no subjective hyperthermia, no subjective hypothermia Eyes: no blurry vision, no xerophthalmia ENT: no sore throat, no nodules palpated in throat, no dysphagia, no odynophagia, no hoarseness Cardiovascular: no CP/no SOB/no palpitations/no leg swelling Respiratory: no cough/no SOB/no wheezing Gastrointestinal: no N/no V/no D/no C/no acid reflux Musculoskeletal: no muscle aches/no joint aches Skin: no rashes, no hair loss Neurological: no tremors/+ numbness/+ tingling/no dizziness  I reviewed pt's medications, allergies, PMH, social hx, family hx, and changes were documented in the history of present illness. Otherwise, unchanged from my initial visit note.   Past Medical History:  Diagnosis Date  . Allergy   . Arthritis   . Blood in stool   . Brain cyst   . Chest pain   . Constipation   . Diabetes mellitus without complication ([place])   . Difficulty urinating   . GERD (gastroesophageal reflux disease)   . Headache(784.0)   . Hearing loss   . Hyperlipidemia   . Hypertension   . Leg swelling   . Nasal congestion   .  PONV (postoperative nausea and vomiting)   . Rectal pain   . Rectal ulcer with bleeding after hemorrhoid banding 02/11/2015  . Sleep apnea   . Stroke ([place])   . Stroke ([place])   . Thrombosed hemorrhoids   . Trouble swallowing    Past Surgical History:  Procedure Laterality Date  . CATARACT EXTRACTION     x 3  . [place]   cataracts  . FLEXIBLE SIGMOIDOSCOPY N/A 02/11/2015   Procedure: FLEXIBLE SIGMOIDOSCOPY;  Surgeon: Gatha Mayer, MD;  Location: [facility];  Service: Endoscopy;  Laterality: N/A;  . INNER EAR SURGERY     rt ear  . INNER EAR SURGERY  1992  . surgery - right arm     1983   Social History   Social History  . Marital  status: Single    Spouse name: N/A  . Number of children: 0  . Years of education: N/A   Occupational History  . Not on file.   Social History Main Topics  . Smoking status: Never Smoker  . Smokeless tobacco: Never Used  . Alcohol use No     Comment: none  . Drug use: No  . Sexual activity: No   Other Topics Concern  . Not on file   Social History Narrative   No caffeine intake except for chocolate.   Current Outpatient Prescriptions on File Prior to Visit  Medication Sig Dispense Refill  . allopurinol (ZYLOPRIM) 100 MG tablet TAKE 1 TABLET BY MOUTH DAILY 30 tablet 3  . amLODipine (NORVASC) 5 MG tablet Take 1 tablet (5 mg total) by mouth daily. 30 tablet 2  . Azelastine HCl 0.15 % SOLN PLACE 2 SPRAYS IN EACH NOSTRIL every night 30 mL 11  . Blood Glucose Monitoring Suppl (ACCU-CHEK GUIDE) w/Device KIT 1 Device by Does not apply route once. Check blood sugars four times daily 1 kit 0  . Blood Glucose Monitoring Suppl (ACCU-CHEK GUIDE) w/Device KIT See admin instructions.  0  . Continuous Blood Gluc Sensor (FREESTYLE LIBRE SENSOR SYSTEM) MISC Change out sensor every 10 days. 9 each 0  . [employer] ASPIRIN 81 MG chewable tablet CHEW 1 TABLET BY MOUTH DAILY. (Patient not taking: Reported on 02/19/2017) 90 tablet 3  . cyclobenzaprine (FLEXERIL) 10 MG tablet Take 1 tablet (10 mg total) by mouth at bedtime as needed for muscle spasms. 30 tablet 0  . diazepam (VALIUM) 5 MG tablet Take 1 tablet (5 mg total) by mouth 3 (three) times daily. 90 tablet 0  . diphenhydrAMINE (BENADRYL) 25 MG tablet     . doxycycline (VIBRA-TABS) 100 MG tablet Take 1 tablet (100 mg total) by mouth 2 (two) times daily. 20 tablet 0  . empagliflozin (JARDIANCE) 25 MG TABS tablet Take 25 mg by mouth daily. 90 tablet 3  . FIBER SELECT GUMMIES PO Take 1 tablet by mouth daily.    Marland Kitchen gentamicin cream (GARAMYCIN) 0.1 % Apply 1 application topically 3 (three) times daily. 30 g 1  . glucose blood (FREESTYLE PRECISION NEO  TEST) test strip Use as instructed to check sugar 4-5 times daily 500 each 5  . glucose blood test strip Check blood sugars four times daily 400 each 12  . GuaiFENesin 200 MG/10ML SOLN     . HYDROcodone-acetaminophen (HYCET) 7.5-325 mg/15 ml solution TAKE 20 ML EVERY 6 HOURS AS NEEDED FOR PAIN 473 mL 0  . hydrocortisone 2.5 % cream APPLY 2 TIMES DAILY TO GROIN AREA AS NEEDED.  1  . hydrOXYzine (ATARAX/VISTARIL) 25 MG tablet Take 1 tablet (25 mg total) by mouth every 8 (eight) hours as needed for anxiety or itching. 90 tablet 0  . ibuprofen (ADVIL) 200 MG tablet     . JANUVIA 100 MG tablet TAKE 1 TABLET (100 MG TOTAL) BY MOUTH DAILY. REPEAT LABS ARE DUE NOW (Patient not taking: Reported on 02/19/2017) 30 tablet 2  . Lancets (ACCU-CHEK SOFT TOUCH) lancets Check blood sugars four times daily 400 each 12  . Lancets (ACCU-CHEK SOFT TOUCH) lancets Check blood sugars four times daily    . linagliptin (TRADJENTA) 5 MG TABS tablet Take 1 tablet (5 mg total) by mouth daily. (Patient not taking: Reported on 02/19/2017) 30 tablet 2  . meclizine (ANTIVERT) 25 MG tablet TAKE 1 TABLET (25 MG TOTAL) BY MOUTH 3 (THREE) TIMES DAILY AS NEEDED. 60 tablet 0  . metoprolol tartrate (LOPRESSOR) 25 MG tablet TAKE 1 TABLET BY MOUTH TWICE A DAY -MAY CRUSH IN FOOD 60 tablet 4  . mometasone (ELOCON) 0.1 % ointment APPLY TO AFFECTED AREA EVERY DAY AS NEEDED FOR RASH 45 g 0  . ondansetron (ZOFRAN-ODT) 4 MG disintegrating tablet USE UP TO THREE TIMES A DAY AS NEEDED FOR NAUSEA. 10 tablet 0  . polyethylene glycol powder (GLYCOLAX/MIRALAX) powder TAKE 17 GRAMS BY MOUTH 2 TIMES DAILY AS NEEDED. 527 g 5  . pravastatin (PRAVACHOL) 20 MG tablet Take 1 tablet (20 mg total) by mouth daily. 90 tablet 0  . PROCTOFOAM HC rectal foam APPLY FOAM THREE TIMES A DAY AS NEEDED FOR 7 DAYS  10  . ranitidine (ZANTAC) 75 MG/5ML syrup TAKE 10 MLS BY MOUTH 2 TIMES DAILY AS NEEDED. FOR INDIGESTION. 300 mL 0  . triamcinolone cream (KENALOG) 0.1 % APPLY TO  RASH TWICE A DAY ON CHEST/ARMS/LEGS  0  . venlafaxine XR (EFFEXOR-XR) 37.5 MG 24 hr capsule Take 1 capsule (37.5 mg total) by mouth daily with breakfast. 90 capsule 0   No current facility-administered medications on file prior to visit.    Allergies  Allergen Reactions  . Amoxicillin Hives, Itching and Other (See Comments)    White tongue; ? hives  . Fluticasone     Other reaction(s): Other Sores inside nose  . Sulfa Antibiotics Rash  . Flonase [Fluticasone Propionate]     Nasal Sores  . Terfenadine     ? reaction   Family History  Problem Relation Age of Onset  . Breast cancer Mother 44       breast  . Stroke Father   . Heart disease Father   . Hypertension Father   . Heart attack Father   . Hypertension Brother   . Hyperlipidemia Neg Hx   . Diabetes Neg Hx    PE: BP 124/78   Pulse 84   Temp 97.9 F (36.6 C) (Oral)   Wt 187 lb (84.8 kg)   SpO2 99%   BMI 26.08 kg/m  Wt Readings from Last 3 Encounters:  03/18/17 187 lb (84.8 kg)  02/19/17 188 lb 9.6 oz (85.5 kg)  07/25/16 187 lb 6.4 oz (85 kg)   Constitutional: overweight, in NAD Eyes: PERRLA, EOMI, no exophthalmos ENT: moist mucous membranes, no thyromegaly, no cervical lymphadenopathy Cardiovascular: RRR, No MRG Respiratory: CTA B Gastrointestinal: abdomen soft, NT, ND, BS+ Musculoskeletal: no deformities, strength intact in all 4 Skin: moist, warm, no rashes, + Maculopapular spot: Appearance of Insect bite on anterior lower abdomen Neurological: no tremor with outstretched hands, DTR normal in all 4  ASSESSMENT: 1. DM2, non-insulin-dependent, uncontrolled, with complications - PVD - cerebro-vascular ds - s/p "mini"strokes - PN  2. HL  PLAN:  1. Patient with uncontrolled DM, returning after another long absence. His sugars improved after we started Jardiance, despite being off Januvia x 2 mo after insurance did not cover it.  - We reviewed together his FreeStyle libre CGM downloads >> he is 74% in  target, and 26% above target, with a coefficient of variation 20.9% and average 156+/-32.6 regarding patterns, his sugars usually increase after eating lunch and they remain elevated but even slightly higher after dinner. Since he needs more mealtime coverage, I advised him to start back Januvia, which has been now approved - Per his questions, we discussed about possible side effects from SGLT 2 inhibitors. He does not appear to experience any of them. I reassured him that his maculopapular spot on his lower abdomen does not appear to be related to Jardiance (per his question) - We reviewed together his latest HbA1c which was much better, at 7.1% last month. - I suggested to:  Patient Instructions  Please continue: - Jardiance 25 mg daily in am  Please restart: - Januvia 100 mg daily before b'fast  Please return in 3 months with your sugar log.   - continue checking sugars at different times of the day - check 1x a day, rotating checks - advised for yearly eye exams >> he is UTD - He refuses a flu shot today - Return to clinic in 3 mo with sugar log   2. HL - improved at last check in 02/2017 - continue Pravastatin - no SEs  Philemon Kingdom, MD PhD [facility]

## 2017-03-18 NOTE — Patient Instructions (Addendum)
Please continue: - Jardiance 25 mg daily in am  Please restart: - Januvia 100 mg daily before b'fast  Please return in 3 months with your sugar log.

## 2017-03-19 ENCOUNTER — Other Ambulatory Visit: Payer: Self-pay | Admitting: Family Medicine

## 2017-03-19 DIAGNOSIS — K648 Other hemorrhoids: Secondary | ICD-10-CM | POA: Diagnosis not present

## 2017-03-19 DIAGNOSIS — M25511 Pain in right shoulder: Secondary | ICD-10-CM

## 2017-03-19 DIAGNOSIS — R768 Other specified abnormal immunological findings in serum: Secondary | ICD-10-CM

## 2017-03-19 DIAGNOSIS — Z8673 Personal history of transient ischemic attack (TIA), and cerebral infarction without residual deficits: Secondary | ICD-10-CM | POA: Diagnosis not present

## 2017-03-27 ENCOUNTER — Other Ambulatory Visit: Payer: BLUE CROSS/BLUE SHIELD

## 2017-03-30 ENCOUNTER — Ambulatory Visit (INDEPENDENT_AMBULATORY_CARE_PROVIDER_SITE_OTHER): Payer: BLUE CROSS/BLUE SHIELD | Admitting: Podiatry

## 2017-03-30 ENCOUNTER — Encounter: Payer: Self-pay | Admitting: Podiatry

## 2017-03-30 DIAGNOSIS — L600 Ingrowing nail: Secondary | ICD-10-CM

## 2017-04-01 NOTE — Progress Notes (Signed)
   Subjective: Patient presents today 2 weeks post ingrown nail permanent nail avulsion procedure of the lateral border of the left great toe. Patient states that the toe and nail fold is feeling much better. He reports completing the course of doxycycline and has been using the gentamicin cream as directed.   Past Medical History:  Diagnosis Date  . Allergy   . Arthritis   . Blood in stool   . Brain cyst   . Chest pain   . Constipation   . Diabetes mellitus without complication ([place])   . Difficulty urinating   . GERD (gastroesophageal reflux disease)   . Headache(784.0)   . Hearing loss   . Hyperlipidemia   . Hypertension   . Leg swelling   . Nasal congestion   . PONV (postoperative nausea and vomiting)   . Rectal pain   . Rectal ulcer with bleeding after hemorrhoid banding 02/11/2015  . Sleep apnea   . Stroke ([place])   . Stroke ([place])   . Thrombosed hemorrhoids   . Trouble swallowing     Objective: Skin is warm, dry and supple. Nail and respective nail fold appears to be healing appropriately. Open wound to the associated nail fold with a granular wound base and moderate amount of fibrotic tissue. Minimal drainage noted. Mild erythema around the periungual region likely due to phenol chemical matricectomy.  Assessment: #1 postop permanent partial nail avulsion Lateral border left great toe #2 open wound periungual nail fold of respective digit.   Plan of care: #1 patient was evaluated  #2 debridement of open wound was performed to the periungual border of the respective toe using a currette. Antibiotic ointment and Band-Aid was applied. #3 patient is to return to clinic on a PRN  basis.   Edrick Kins, DPM [facility]  Dr. Edrick Kins, [address]                                        [place], [place] [address]                Office [phone]  Fax [phone]

## 2017-04-07 ENCOUNTER — Encounter: Payer: Self-pay | Admitting: Family Medicine

## 2017-04-07 ENCOUNTER — Other Ambulatory Visit: Payer: Self-pay

## 2017-04-07 ENCOUNTER — Encounter: Payer: Self-pay | Admitting: Internal Medicine

## 2017-04-07 MED ORDER — FREESTYLE LIBRE 14 DAY SENSOR MISC: 1.0000 | 3 refills | Status: DC

## 2017-04-07 MED ORDER — FREESTYLE LIBRE 14 DAY READER DEVI: 1.0000 | Freq: Every day | 0 refills | Status: DC

## 2017-04-08 ENCOUNTER — Other Ambulatory Visit: Payer: Self-pay | Admitting: Family Medicine

## 2017-04-08 DIAGNOSIS — I10 Essential (primary) hypertension: Secondary | ICD-10-CM

## 2017-04-09 ENCOUNTER — Other Ambulatory Visit: Payer: Self-pay | Admitting: Family Medicine

## 2017-04-17 ENCOUNTER — Other Ambulatory Visit: Payer: Self-pay | Admitting: Family Medicine

## 2017-04-17 DIAGNOSIS — I10 Essential (primary) hypertension: Secondary | ICD-10-CM

## 2017-04-17 DIAGNOSIS — M25511 Pain in right shoulder: Secondary | ICD-10-CM

## 2017-04-30 ENCOUNTER — Encounter: Payer: Self-pay | Admitting: Internal Medicine

## 2017-05-11 DIAGNOSIS — G4733 Obstructive sleep apnea (adult) (pediatric): Secondary | ICD-10-CM | POA: Diagnosis not present

## 2017-05-13 ENCOUNTER — Other Ambulatory Visit: Payer: Self-pay

## 2017-05-13 ENCOUNTER — Telehealth: Payer: Self-pay

## 2017-05-13 DIAGNOSIS — IMO0002 Reserved for concepts with insufficient information to code with codable children: Secondary | ICD-10-CM

## 2017-05-13 DIAGNOSIS — E1151 Type 2 diabetes mellitus with diabetic peripheral angiopathy without gangrene: Secondary | ICD-10-CM

## 2017-05-13 DIAGNOSIS — E1165 Type 2 diabetes mellitus with hyperglycemia: Secondary | ICD-10-CM

## 2017-05-13 MED ORDER — EMPAGLIFLOZIN 25 MG PO TABS: 25.0000 mg | ORAL_TABLET | Freq: Every day | ORAL | 3 refills | Status: DC

## 2017-05-13 NOTE — Telephone Encounter (Signed)
Dose of 25 mg?

## 2017-05-13 NOTE — Telephone Encounter (Signed)
Yes, sure 

## 2017-05-13 NOTE — Telephone Encounter (Signed)
Submitted

## 2017-05-13 NOTE — Telephone Encounter (Signed)
Patient would like to go back to [place], based off of last [employer]. I advised I would try to complete the PA for that again, but I received notification that it did not need one. I notified the patient via mychart to let him know.. That I could try to send it back in if okay with you? Thanks!

## 2017-05-13 NOTE — Telephone Encounter (Signed)
yes

## 2017-05-14 ENCOUNTER — Other Ambulatory Visit: Payer: Self-pay | Admitting: Family Medicine

## 2017-05-14 DIAGNOSIS — M25511 Pain in right shoulder: Secondary | ICD-10-CM

## 2017-05-14 NOTE — Telephone Encounter (Signed)
Micheal from cover my meds is calling on the status of PV for medication Jardiance. Please advise  P# [phone] Ref key # [id number]

## 2017-05-16 ENCOUNTER — Other Ambulatory Visit: Payer: Self-pay | Admitting: Family Medicine

## 2017-05-16 DIAGNOSIS — M25511 Pain in right shoulder: Secondary | ICD-10-CM

## 2017-05-18 MED ORDER — SITAGLIPTIN PHOSPHATE 100 MG PO TABS: 100.0000 mg | ORAL_TABLET | Freq: Every day | ORAL | 0 refills | Status: DC

## 2017-05-20 ENCOUNTER — Telehealth: Payer: Self-pay

## 2017-05-20 MED ORDER — LINAGLIPTIN 5 MG PO TABS: 5.0000 mg | ORAL_TABLET | Freq: Every day | ORAL | 1 refills | Status: DC

## 2017-05-20 NOTE — Telephone Encounter (Signed)
PA for Januvia was declined. Sent Tradjenta per Dr. Ena Dawley request

## 2017-05-22 ENCOUNTER — Telehealth: Payer: Self-pay

## 2017-05-22 NOTE — Telephone Encounter (Signed)
Sent mychart message to patient letting him know that [place] is declined and that he needs to contact his insurance to verify prices for [employer], or Onglyza. She stated we can try another PA 06/16/17

## 2017-05-27 ENCOUNTER — Other Ambulatory Visit: Payer: Self-pay | Admitting: Family Medicine

## 2017-06-18 ENCOUNTER — Ambulatory Visit (INDEPENDENT_AMBULATORY_CARE_PROVIDER_SITE_OTHER): Payer: BLUE CROSS/BLUE SHIELD | Admitting: Internal Medicine

## 2017-06-18 ENCOUNTER — Encounter: Payer: Self-pay | Admitting: Internal Medicine

## 2017-06-18 VITALS — BP 120/70 | HR 89 | Ht 71.0 in | Wt 186.6 lb

## 2017-06-18 DIAGNOSIS — E1165 Type 2 diabetes mellitus with hyperglycemia: Secondary | ICD-10-CM | POA: Diagnosis not present

## 2017-06-18 DIAGNOSIS — IMO0002 Reserved for concepts with insufficient information to code with codable children: Secondary | ICD-10-CM

## 2017-06-18 DIAGNOSIS — E785 Hyperlipidemia, unspecified: Secondary | ICD-10-CM | POA: Diagnosis not present

## 2017-06-18 DIAGNOSIS — E1151 Type 2 diabetes mellitus with diabetic peripheral angiopathy without gangrene: Secondary | ICD-10-CM

## 2017-06-18 MED ORDER — SITAGLIPTIN PHOSPHATE 100 MG PO TABS: 100.0000 mg | ORAL_TABLET | Freq: Every day | ORAL | 5 refills | Status: DC

## 2017-06-18 MED ORDER — PIOGLITAZONE HCL 15 MG PO TABS: 15.0000 mg | ORAL_TABLET | Freq: Every day | ORAL | 5 refills | Status: DC

## 2017-06-18 NOTE — Progress Notes (Signed)
Patient ID: Chase [name], male   DOB: 01/27/1961, 57 y.o.   MRN: [id number]  HPI: Chase [name] is a 57 y.o.-year-old male, returning for follow-up for DM2, dx in 2014, non-insulin-dependent, uncontrolled, with complications (PVD, cerebro-vascular ds - s/p "mini"strokes, PN). Last visit 3 mo ago.  Last hemoglobin A1c was: Lab Results  Component Value Date   HGBA1C 7.1 (H) 02/19/2017   HGBA1C 7.4 02/21/2016   HGBA1C 9.8 (H) 11/13/2015   Pt is on a regimen of: - Jardiance 25 mg daily in am - started 04/2016 He was Januvia 100 mg daily in am - started 10/2015 >> off 2/2 insurance coverage Stopped Glipizide b/c rash. Stopped Invokana 100 mg in am >> only got this for 1 mo He was on Metformin 500 mg po bid - in liquid form, as he could not swallow pills. He gets gi upset. He stopped this in 2014.  Pt checks his sugars frequently with his Freestyle libre CGM 25% to 75%: Average 170+/-37.5, coefficient of variation 22.1 Above target (>180): 36% Within target (70-180): 64% Below target (<70): 0%  - fasting: ave 140 -150s >> brunch:  152-194 >> 130-150 - 2h after lunch: n/c >> ave 158 >> 312 >> 150-220 - before dinner: 130 >> ave 127 >> 216 >> 150-200 - 2h after dinner: n/c >> ave 183 >> 191-273 >> 180-210 - bedtime: 172-214 >> 208 >> see above - nighttime: n/c Lowest sugar was  152 >> 90s >> 120; he has hypoglycemia awareness at 70s.  Highest sugar was  300s >> 250s after he eats out >> 300s (at night)  Glucometer: [employer] Next  - + mild CKD, last BUN/creatinine:  Lab Results  Component Value Date   BUN 13 02/19/2017   BUN 11 11/13/2015   CREATININE 1.08 02/19/2017   CREATININE 1.10 11/13/2015   - + HL.  last set of lipids: Lab Results  Component Value Date   CHOL 163 02/19/2017   HDL 32.80 (L) 02/19/2017   LDLCALC 100 (H) 02/19/2017   LDLDIRECT 148.2 06/03/2013   TRIG 151.0 (H) 02/19/2017   CHOLHDL 5 02/19/2017  On Pravastatin 20. 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 He has anxiety, PTSD from childhood. Has varicose veins >> wears compresion hoses. He also has a h/o kidney stones.   ROS: Constitutional: no weight gain/no weight loss, no fatigue, no subjective hyperthermia, no subjective hypothermia Eyes: no blurry vision, no xerophthalmia ENT: no sore throat, no nodules palpated in throat, no dysphagia, no odynophagia, no hoarseness Cardiovascular: no CP/no SOB/no palpitations/no leg swelling Respiratory: no cough/no SOB/no wheezing Gastrointestinal: no N/no V/no D/no C/no acid reflux Musculoskeletal: no muscle aches/no joint aches Skin: no rashes, no hair loss Neurological: no tremors/no numbness/no tingling/no dizziness  I reviewed pt's medications, allergies, PMH, social hx, family hx, and changes were documented in the history of present illness. Otherwise, unchanged from my initial visit note.   Past Medical History:  Diagnosis Date  . Allergy   . Arthritis   . Blood in stool   . Brain cyst   . Chest pain   . Constipation   . Diabetes mellitus without complication ([place])   . Difficulty urinating   . GERD (gastroesophageal reflux disease)   . Headache(784.0)   .  Hearing loss   . Hyperlipidemia   . Hypertension   . Leg swelling   . Nasal congestion   . PONV (postoperative nausea and vomiting)   . Rectal pain   . Rectal ulcer with bleeding after hemorrhoid banding 02/11/2015  . Sleep apnea   . Stroke ([place])   . Stroke ([place])   . Thrombosed hemorrhoids   . Trouble swallowing    Past Surgical History:  Procedure Laterality Date  . CATARACT EXTRACTION     x 3  . [place]   cataracts  . FLEXIBLE SIGMOIDOSCOPY N/A 02/11/2015   Procedure: FLEXIBLE SIGMOIDOSCOPY;  Surgeon: Gatha Mayer, MD;  Location: [facility];  Service:  Endoscopy;  Laterality: N/A;  . INNER EAR SURGERY     rt ear  . INNER EAR SURGERY  1992  . surgery - right arm     1983   Social History   Socioeconomic History  . Marital status: Single    Spouse name: Not on file  . Number of children: 0  . Years of education: Not on file  . Highest education level: Not on file  Social Needs  . Financial resource strain: Not on file  . Food insecurity - worry: Not on file  . Food insecurity - inability: Not on file  . Transportation needs - medical: Not on file  . Transportation needs - non-medical: Not on file  Occupational History  . Not on file  Tobacco Use  . Smoking status: Never Smoker  . Smokeless tobacco: Never Used  Substance and Sexual Activity  . Alcohol use: No    Alcohol/week: 0.0 oz    Comment: none  . Drug use: No  . Sexual activity: No  Other Topics Concern  . Not on file  Social History Narrative   No caffeine intake except for chocolate.   Current Outpatient Medications on File Prior to Visit  Medication Sig Dispense Refill  . allopurinol (ZYLOPRIM) 100 MG tablet TAKE 1 TABLET BY MOUTH EVERY DAY 30 tablet 3  . amLODipine (NORVASC) 5 MG tablet TAKE 1 TABLET BY MOUTH EVERY DAY 30 tablet 2  . Azelastine HCl 0.15 % SOLN PLACE 2 SPRAYS IN EACH NOSTRIL every night 30 mL 11  . Continuous Blood Gluc Receiver (FREESTYLE LIBRE 14 DAY READER) DEVI 1 Device by Does not apply route daily. 1 Device 0  . Continuous Blood Gluc Sensor (FREESTYLE LIBRE 14 DAY SENSOR) MISC Inject 1 Device into the skin every 14 (fourteen) days. 2 each 3  . [employer] CH[employer]ENS ASPIRIN 81 MG chewable tablet CHEW 1 TABLET BY MOUTH DAILY. 90 tablet 3  . cyclobenzaprine (FLEXERIL) 10 MG tablet TAKE 1 TABLET BY MOUTH AT BEDTIME AS NEEDED FOR MUSCLE SPASMS. 30 tablet 0  . diphenhydrAMINE (BENADRYL) 25 MG tablet     . doxycycline (VIBRA-TABS) 100 MG tablet Take 1 tablet (100 mg total) by mouth 2 (two) times daily. 20 tablet 0  . empagliflozin (JARDIANCE) 25 MG TABS  tablet Take 25 mg by mouth daily. 90 tablet 3  . FIBER SELECT GUMMIES PO Take 1 tablet by mouth daily.    Marland Kitchen gentamicin cream (GARAMYCIN) 0.1 % Apply 1 application topically 3 (three) times daily. 30 g 1  . glucose blood (FREESTYLE PRECISION NEO TEST) test strip Use as instructed to check sugar 4-5 times daily 500 each 5  . glucose blood test strip Check blood sugars four times daily 400 each 12  . GuaiFENesin 200 MG/10ML  SOLN     . HYDROcodone-acetaminophen (HYCET) 7.5-325 mg/15 ml solution TAKE 20 ML EVERY 6 HOURS AS NEEDED FOR PAIN 473 mL 0  . hydrOXYzine (ATARAX/VISTARIL) 25 MG tablet TAKE 1 TABLET BY MOUTH EVERY 8 HOURS AS NEEDED FOR ANXIETY OR ITCHING. 90 tablet 0  . ibuprofen (ADVIL) 200 MG tablet     . Lancets (ACCU-CHEK SOFT TOUCH) lancets Check blood sugars four times daily 400 each 12  . linagliptin (TRADJENTA) 5 MG TABS tablet Take 1 tablet (5 mg total) by mouth daily. 30 tablet 1  . loratadine (CLARITIN) 10 MG tablet Take 10 mg by mouth daily.    . metoprolol tartrate (LOPRESSOR) 25 MG tablet TAKE 1 TABLET BY MOUTH TWICE A DAY -MAY CRUSH IN FOOD 60 tablet 4  . mometasone (ELOCON) 0.1 % ointment APPLY TO AFFECTED AREA EVERY DAY AS NEEDED FOR RASH 45 g 0  . ondansetron (ZOFRAN-ODT) 4 MG disintegrating tablet USE UP TO THREE TIMES A DAY AS NEEDED FOR NAUSEA. 10 tablet 0  . polyethylene glycol powder (GLYCOLAX/MIRALAX) powder TAKE 17 GRAMS BY MOUTH 2 TIMES DAILY AS NEEDED. 527 g 5  . pravastatin (PRAVACHOL) 20 MG tablet Take 1 tablet (20 mg total) by mouth daily. 90 tablet 0  . PROCTOFOAM HC rectal foam APPLY FOAM THREE TIMES A DAY AS NEEDED FOR 7 DAYS  10  . ranitidine (ZANTAC) 75 MG/5ML syrup TAKE 10 MLS BY MOUTH 2 TIMES DAILY AS NEEDED. FOR INDIGESTION. 300 mL 0  . Triamcinolone Acetonide (NASACORT ALLERGY 24HR NA) Place into the nose.    . venlafaxine XR (EFFEXOR-XR) 37.5 MG 24 hr capsule TAKE 1 CAPSULE (37.5 MG TOTAL) BY MOUTH DAILY WITH BREAKFAST. 90 capsule 0  . Blood Glucose  Monitoring Suppl (ACCU-CHEK GUIDE) w/Device KIT 1 Device by Does not apply route once. Check blood sugars four times daily 1 kit 0   No current facility-administered medications on file prior to visit.    Allergies  Allergen Reactions  . Amoxicillin Hives, Itching and Other (See Comments)    White tongue; ? hives  . Fluticasone     Other reaction(s): Other Sores inside nose  . Sulfa Antibiotics Rash  . Flonase [Fluticasone Propionate]     Nasal Sores  . Terfenadine     ? reaction   Family History  Problem Relation Age of Onset  . Breast cancer Mother 35       breast  . Stroke Father   . Heart disease Father   . Hypertension Father   . Heart attack Father   . Hypertension Brother   . Hyperlipidemia Neg Hx   . Diabetes Neg Hx    PE: BP 120/70   Pulse 89   Ht _0  (1.803 m)   Wt 186 lb 9.6 oz (84.6 kg)   SpO2 97%   BMI 26.03 kg/m  Wt Readings from Last 3 Encounters:  06/18/17 186 lb 9.6 oz (84.6 kg)  03/18/17 187 lb (84.8 kg)  02/19/17 188 lb 9.6 oz (85.5 kg)   Constitutional: overweight, in NAD Eyes: PERRLA, EOMI, no exophthalmos ENT: moist mucous membranes, no thyromegaly, no cervical lymphadenopathy Cardiovascular: RRR, No MRG Respiratory: CTA B Gastrointestinal: abdomen soft, NT, ND, BS+ Musculoskeletal: no deformities, strength intact in all 4 Skin: moist, warm, no rashes Neurological: no tremor with outstretched hands, DTR normal in all 4  ASSESSMENT: 1. DM2, non-insulin-dependent, uncontrolled, with complications - PVD - cerebro-vascular ds - s/p "mini"strokes - PN  2. HL  PLAN:  1.  Patient with history of uncontrolled type 2 diabetes previously doing well on Januvia and Jardiance, now he is forced to stay off Januvia due to price.  We discussed about other, cheaper, options, however, he cannot tolerate metformin and glipizide.  The only other cheaper option we have is Actos.  We discussed about possible side effects of fluid retention and weight  gain but we decided to start with a low dose in case he cannot obtain Januvia in the new year.  I did try to send it again to the pharmacy and he will discussed with the pharmacist whether he can get it cheaper this year. - We downloaded his freestyle libre CGM and reviewed together the results (please see HPI): Sugars are higher after meals but also he is above goal in the morning.  Actos can help with both of these. - I suggested to:  Patient Instructions  Please continue: - Jardiance 25 mg daily in am  Please add: - Januvia 100 mg daily in am  If you cannot get Januvia, start: - Actos 15 mg daily in am   Please come back for a follow-up appointment in 3 months.  - today, HbA1c is 7.8% (higher) - continue checking sugars at different times of the day - check 1x a day, rotating checks - advised for yearly eye exams >> he is due - Return to clinic in 3 mo with sugar log    2. HL - Improved per last check in 02/2017 -Continue pravastatin -No side effects  Philemon Kingdom, MD PhD [facility] Endocrinology

## 2017-06-18 NOTE — Patient Instructions (Addendum)
Please continue: - Jardiance 25 mg daily in am  Please add: - Januvia 100 mg daily in am  If you cannot get Januvia, start: - Actos 15 mg daily in am   Please come back for a follow-up appointment in 3 months.

## 2017-06-19 LAB — POCT GLYCOSYLATED HEMOGLOBIN (HGB A1C): Hemoglobin A1C: 7.8

## 2017-06-19 NOTE — Addendum Note (Signed)
Addended by: Drucilla Schmidt on: 06/19/2017 08:01 AM   Modules accepted: Orders

## 2017-07-01 ENCOUNTER — Telehealth: Payer: Self-pay

## 2017-07-01 ENCOUNTER — Encounter: Payer: Self-pay | Admitting: Internal Medicine

## 2017-07-01 DIAGNOSIS — IMO0002 Reserved for concepts with insufficient information to code with codable children: Secondary | ICD-10-CM

## 2017-07-01 DIAGNOSIS — E1151 Type 2 diabetes mellitus with diabetic peripheral angiopathy without gangrene: Secondary | ICD-10-CM

## 2017-07-01 DIAGNOSIS — E1165 Type 2 diabetes mellitus with hyperglycemia: Secondary | ICD-10-CM

## 2017-07-01 MED ORDER — SITAGLIPTIN PHOSPHATE 100 MG PO TABS: 100.0000 mg | ORAL_TABLET | Freq: Every day | ORAL | 0 refills | Status: DC

## 2017-07-01 NOTE — Telephone Encounter (Signed)
Sent to pharmacy and will start PA once received     C,  Can we send again the PA for Januvia? May be in the new year, things have changed...  I really am not sure what else we can do for him.  Ty,  C

## 2017-07-01 NOTE — Telephone Encounter (Signed)
No PA needed medication is covered

## 2017-07-01 NOTE — Telephone Encounter (Signed)
This encounter was created in error - please disregard.

## 2017-07-06 ENCOUNTER — Encounter: Payer: Self-pay | Admitting: Podiatry

## 2017-07-06 ENCOUNTER — Ambulatory Visit (INDEPENDENT_AMBULATORY_CARE_PROVIDER_SITE_OTHER): Payer: BLUE CROSS/BLUE SHIELD | Admitting: Podiatry

## 2017-07-06 DIAGNOSIS — E0843 Diabetes mellitus due to underlying condition with diabetic autonomic (poly)neuropathy: Secondary | ICD-10-CM

## 2017-07-06 DIAGNOSIS — B351 Tinea unguium: Secondary | ICD-10-CM

## 2017-07-06 DIAGNOSIS — L918 Other hypertrophic disorders of the skin: Secondary | ICD-10-CM | POA: Diagnosis not present

## 2017-07-06 DIAGNOSIS — M79676 Pain in unspecified toe(s): Secondary | ICD-10-CM

## 2017-07-08 NOTE — Progress Notes (Signed)
   SUBJECTIVE Patient with a history of diabetes mellitus presents to office today complaining of elongated, thickened nails. Pain while ambulating in shoes. Patient is unable to trim their own nails.   Past Medical History:  Diagnosis Date  . Allergy   . Arthritis   . Blood in stool   . Brain cyst   . Chest pain   . Constipation   . Diabetes mellitus without complication ([place])   . Difficulty urinating   . GERD (gastroesophageal reflux disease)   . Headache(784.0)   . Hearing loss   . Hyperlipidemia   . Hypertension   . Leg swelling   . Nasal congestion   . PONV (postoperative nausea and vomiting)   . Rectal pain   . Rectal ulcer with bleeding after hemorrhoid banding 02/11/2015  . Sleep apnea   . Stroke ([place])   . Stroke ([place])   . Thrombosed hemorrhoids   . Trouble swallowing     OBJECTIVE General Patient is awake, alert, and oriented x 3 and in no acute distress. Derm Skin tag noted to base of lateral great toenail fold. Skin is dry and supple bilateral. Negative open lesions or macerations. Remaining integument unremarkable. Nails are tender, long, thickened and dystrophic with subungual debris, consistent with onychomycosis, 1-5 bilateral. No signs of infection noted. Vasc  DP and PT pedal pulses palpable bilaterally. Temperature gradient within normal limits.  Neuro Epicritic and protective threshold sensation diminished bilaterally.  Musculoskeletal Exam No symptomatic pedal deformities noted bilateral. Muscular strength within normal limits.  ASSESSMENT 1. Diabetes Mellitus w/ peripheral neuropathy 2. Onychomycosis of nail due to dermatophyte bilateral 3. Skin tag left lateral great toe  PLAN OF CARE 1. Patient evaluated today. 2. Instructed to maintain good pedal hygiene and foot care. Stressed importance of controlling blood sugar.  3. Mechanical debridement of nails 1-5 bilaterally performed using a nail nipper. Filed with dremel without incident.  4. Apply  Silvadene cream daily with a Band-Aid. 5. Return to clinic in 2 weeks.    Edrick Kins, DPM [facility]  Dr. Edrick Kins, [address]                                        [place], [place] [address]                Office [phone]  Fax [phone]

## 2017-07-10 ENCOUNTER — Other Ambulatory Visit: Payer: Self-pay | Admitting: Family Medicine

## 2017-07-10 DIAGNOSIS — M25511 Pain in right shoulder: Secondary | ICD-10-CM

## 2017-07-10 DIAGNOSIS — I10 Essential (primary) hypertension: Secondary | ICD-10-CM

## 2017-07-20 ENCOUNTER — Ambulatory Visit (INDEPENDENT_AMBULATORY_CARE_PROVIDER_SITE_OTHER): Payer: BLUE CROSS/BLUE SHIELD | Admitting: Podiatry

## 2017-07-20 DIAGNOSIS — E0843 Diabetes mellitus due to underlying condition with diabetic autonomic (poly)neuropathy: Secondary | ICD-10-CM

## 2017-07-20 DIAGNOSIS — L600 Ingrowing nail: Secondary | ICD-10-CM

## 2017-07-20 MED ORDER — GABAPENTIN 100 MG PO CAPS: 100.0000 mg | ORAL_CAPSULE | Freq: Three times a day (TID) | ORAL | 3 refills | Status: DC

## 2017-07-20 MED ORDER — GENTAMICIN SULFATE 0.1 % EX CREA: 1.0000 "application " | TOPICAL_CREAM | Freq: Three times a day (TID) | CUTANEOUS | 1 refills | Status: DC

## 2017-07-20 NOTE — Patient Instructions (Signed)

## 2017-07-23 NOTE — Progress Notes (Signed)
   Subjective: 57 year old male presents today for evaluation of pain to the lateral border of the left great toenail. She had a skin tag removed from lateral edge of the toe two weeks ago. She reports continued soreness and states it does not appear to be healing well. She has been soaking the foot but states that causes increased irritation. She has been applying Silvadene cream as directed. There are no modifying factors noted. Patient presents today for further treatment and evaluation.   Past Medical History:  Diagnosis Date  . Allergy   . Arthritis   . Blood in stool   . Brain cyst   . Chest pain   . Constipation   . Diabetes mellitus without complication ([place])   . Difficulty urinating   . GERD (gastroesophageal reflux disease)   . Headache(784.0)   . Hearing loss   . Hyperlipidemia   . Hypertension   . Leg swelling   . Nasal congestion   . PONV (postoperative nausea and vomiting)   . Rectal pain   . Rectal ulcer with bleeding after hemorrhoid banding 02/11/2015  . Sleep apnea   . Stroke ([place])   . Stroke ([place])   . Thrombosed hemorrhoids   . Trouble swallowing     Objective:  General: Well developed, nourished, in no acute distress, alert and oriented x3   Dermatology: Skin is warm, dry and supple bilateral. Lateral border of the left great toe appears to be erythematous with evidence of an ingrowing nail. Pain on palpation noted to the border of the nail fold. The remaining nails appear unremarkable at this time. There are no open sores, lesions.  Vascular: Dorsalis Pedis artery and Posterior Tibial artery pedal pulses palpable. No lower extremity edema noted.   Neruologic: Grossly intact via light touch bilateral.  Musculoskeletal: Muscular strength within normal limits in all groups bilateral. Normal range of motion noted to all pedal and ankle joints.   Assesement: #1 Paronychia with ingrowing nail lateral border left great toe #2 Pain in toe #3 Incurvated  nail #4 DM with neuropathy   Plan of Care:  1. Patient evaluated.  2. Discussed treatment alternatives and plan of care. Explained nail avulsion procedure and post procedure course to patient. 3. Patient opted for temporary partial nail avulsion.  4. Prior to procedure, local anesthesia infiltration utilized using 3 ml of a 50:50 mixture of 2% plain lidocaine and 0.5% plain marcaine in a normal hallux block fashion and a betadine prep performed.  5. Partial temporary nail avulsion performed.  6. Light dressing applied. 7. Prescription for gabapentin 100 mg three times daily provided to patient.  8. Prescription for gentamicin cream provided to patient.  9. Return to clinic in 2 weeks.   Edrick Kins, DPM [facility]  Dr. Edrick Kins, [address]                                        [place], [place] [address]                Office [phone]  Fax [phone]

## 2017-08-03 ENCOUNTER — Ambulatory Visit (INDEPENDENT_AMBULATORY_CARE_PROVIDER_SITE_OTHER): Payer: BLUE CROSS/BLUE SHIELD | Admitting: Podiatry

## 2017-08-03 DIAGNOSIS — L600 Ingrowing nail: Secondary | ICD-10-CM | POA: Diagnosis not present

## 2017-08-03 DIAGNOSIS — E0843 Diabetes mellitus due to underlying condition with diabetic autonomic (poly)neuropathy: Secondary | ICD-10-CM | POA: Diagnosis not present

## 2017-08-04 ENCOUNTER — Ambulatory Visit (INDEPENDENT_AMBULATORY_CARE_PROVIDER_SITE_OTHER): Payer: BLUE CROSS/BLUE SHIELD | Admitting: Family Medicine

## 2017-08-04 ENCOUNTER — Encounter: Payer: Self-pay | Admitting: Internal Medicine

## 2017-08-04 ENCOUNTER — Encounter: Payer: Self-pay | Admitting: Family Medicine

## 2017-08-04 VITALS — BP 120/78 | HR 80 | Temp 98.0°F | Resp 16 | Ht 71.0 in | Wt 185.2 lb

## 2017-08-04 DIAGNOSIS — M25511 Pain in right shoulder: Secondary | ICD-10-CM | POA: Diagnosis not present

## 2017-08-04 DIAGNOSIS — G8929 Other chronic pain: Secondary | ICD-10-CM | POA: Diagnosis not present

## 2017-08-04 DIAGNOSIS — E1151 Type 2 diabetes mellitus with diabetic peripheral angiopathy without gangrene: Secondary | ICD-10-CM

## 2017-08-04 DIAGNOSIS — Z0000 Encounter for general adult medical examination without abnormal findings: Secondary | ICD-10-CM | POA: Diagnosis not present

## 2017-08-04 DIAGNOSIS — R768 Other specified abnormal immunological findings in serum: Secondary | ICD-10-CM

## 2017-08-04 DIAGNOSIS — K649 Unspecified hemorrhoids: Secondary | ICD-10-CM

## 2017-08-04 DIAGNOSIS — Z79899 Other long term (current) drug therapy: Secondary | ICD-10-CM | POA: Diagnosis not present

## 2017-08-04 DIAGNOSIS — J302 Other seasonal allergic rhinitis: Secondary | ICD-10-CM

## 2017-08-04 DIAGNOSIS — K219 Gastro-esophageal reflux disease without esophagitis: Secondary | ICD-10-CM | POA: Diagnosis not present

## 2017-08-04 DIAGNOSIS — M2550 Pain in unspecified joint: Secondary | ICD-10-CM

## 2017-08-04 DIAGNOSIS — D229 Melanocytic nevi, unspecified: Secondary | ICD-10-CM | POA: Diagnosis not present

## 2017-08-04 DIAGNOSIS — M109 Gout, unspecified: Secondary | ICD-10-CM | POA: Diagnosis not present

## 2017-08-04 DIAGNOSIS — F322 Major depressive disorder, single episode, severe without psychotic features: Secondary | ICD-10-CM

## 2017-08-04 DIAGNOSIS — I10 Essential (primary) hypertension: Secondary | ICD-10-CM

## 2017-08-04 LAB — CBC WITH DIFFERENTIAL/PLATELET
Basophils Absolute: 0 K/uL (ref 0.0–0.1)
Basophils Relative: 0.3 % (ref 0.0–3.0)
Eosinophils Absolute: 0.2 K/uL (ref 0.0–0.7)
Eosinophils Relative: 2.3 % (ref 0.0–5.0)
HCT: 44.4 % (ref 39.0–52.0)
Hemoglobin: 15.1 g/dL (ref 13.0–17.0)
Lymphocytes Relative: 18.7 % (ref 12.0–46.0)
Lymphs Abs: 1.9 K/uL (ref 0.7–4.0)
MCHC: 34 g/dL (ref 30.0–36.0)
MCV: 88.5 fl (ref 78.0–100.0)
Monocytes Absolute: 0.7 K/uL (ref 0.1–1.0)
Monocytes Relative: 7 % (ref 3.0–12.0)
Neutro Abs: 7.2 K/uL (ref 1.4–7.7)
Neutrophils Relative %: 71.7 % (ref 43.0–77.0)
Platelets: 354 K/uL (ref 150.0–400.0)
RBC: 5.01 Mil/uL (ref 4.22–5.81)
RDW: 13.3 % (ref 11.5–15.5)
WBC: 10.1 K/uL (ref 4.0–10.5)

## 2017-08-04 LAB — COMPREHENSIVE METABOLIC PANEL
ALT: 22 U/L (ref 0–53)
AST: 17 U/L (ref 0–37)
Albumin: 4.5 g/dL (ref 3.5–5.2)
Alkaline Phosphatase: 90 U/L (ref 39–117)
BUN: 17 mg/dL (ref 6–23)
CO2: 31 mEq/L (ref 19–32)
Calcium: 9.6 mg/dL (ref 8.4–10.5)
Chloride: 101 mEq/L (ref 96–112)
Creatinine, Ser: 1.09 mg/dL (ref 0.40–1.50)
GFR: 74.13 mL/min (ref >60.00)
Glucose, Bld: 131 mg/dL — ABNORMAL HIGH (ref 70–99)
Potassium: 3.5 mEq/L (ref 3.5–5.1)
Sodium: 138 mEq/L (ref 135–145)
Total Bilirubin: 0.7 mg/dL (ref 0.2–1.2)
Total Protein: 7.7 g/dL (ref 6.0–8.3)

## 2017-08-04 LAB — LIPID PANEL
Cholesterol: 159 mg/dL (ref 0–200)
HDL: 35.4 mg/dL — ABNORMAL LOW (ref >39.00)
LDL Cholesterol: 102 mg/dL — ABNORMAL HIGH (ref 0–99)
NonHDL: 123.16
Total CHOL/HDL Ratio: 4
Triglycerides: 106 mg/dL (ref 0.0–149.0)
VLDL: 21.2 mg/dL (ref 0.0–40.0)

## 2017-08-04 LAB — URIC ACID: Uric Acid, Serum: 3.2 mg/dL — ABNORMAL LOW (ref 4.0–7.8)

## 2017-08-04 LAB — MICROALBUMIN / CREATININE URINE RATIO
Creatinine,U: 70.4 mg/dL
Microalb Creat Ratio: 1 mg/g (ref 0.0–30.0)
Microalb, Ur: 0.7 mg/dL (ref 0.0–1.9)

## 2017-08-04 MED ORDER — CYCLOBENZAPRINE HCL 10 MG PO TABS: ORAL_TABLET | ORAL | 0 refills | Status: DC

## 2017-08-04 MED ORDER — AZELASTINE HCL 0.15 % NA SOLN: NASAL | 11 refills | Status: DC

## 2017-08-04 MED ORDER — HYDROCODONE-ACETAMINOPHEN 7.5-325 MG/15ML PO SOLN
ORAL | 0 refills | Status: DC
Start: 2017-08-04 — End: 2017-11-19

## 2017-08-04 MED ORDER — HYDROXYZINE HCL 25 MG PO TABS: ORAL_TABLET | ORAL | 0 refills | Status: DC

## 2017-08-04 MED ORDER — FREESTYLE LIBRE 14 DAY SENSOR MISC: 1.0000 | 3 refills | Status: DC

## 2017-08-04 MED ORDER — METOPROLOL TARTRATE 25 MG PO TABS: ORAL_TABLET | ORAL | 4 refills | Status: DC

## 2017-08-04 MED ORDER — RANITIDINE HCL 75 MG/5ML PO SYRP: ORAL_SOLUTION | ORAL | 1 refills | Status: DC

## 2017-08-04 MED ORDER — POLYETHYLENE GLYCOL 3350 17 GM/SCOOP PO POWD: ORAL | 5 refills | Status: DC

## 2017-08-04 MED ORDER — VENLAFAXINE HCL ER 75 MG PO CP24: 75.0000 mg | ORAL_CAPSULE | Freq: Every day | ORAL | 1 refills | Status: DC

## 2017-08-04 MED ORDER — PROCTOFOAM HC 1-1 % RE FOAM: RECTAL | 10 refills | Status: DC

## 2017-08-04 MED ORDER — HYDROCODONE-ACETAMINOPHEN 7.5-325 MG/15ML PO SOLN: ORAL | 0 refills | Status: DC

## 2017-08-04 NOTE — Patient Instructions (Addendum)

## 2017-08-04 NOTE — Progress Notes (Signed)
Patient ID: Chase [name], male    DOB: Jun 10, 1961  Age: 57 y.o. MRN: [id number]    Subjective:  Subjective  HPI Chase [name] presents for cpe   Pt c/o issues with worsening anger and would like to inc his effexor. He also needs many refills   He needs f/u with surgery for hemorrhoids and needs referrals due to him having medicaid,    Review of Systems  Constitutional: Negative.   HENT: Negative for congestion, ear pain, hearing loss, nosebleeds, postnasal drip, rhinorrhea, sinus pressure, sneezing and tinnitus.   Eyes: Negative for photophobia, discharge, itching and visual disturbance.  Respiratory: Negative.   Cardiovascular: Negative.   Gastrointestinal: Negative for abdominal distention, abdominal pain, anal bleeding, blood in stool and constipation.  Endocrine: Negative.   Genitourinary: Negative.   Musculoskeletal: Negative.   Skin: Negative.   Allergic/Immunologic: Negative.   Neurological: Negative for dizziness, weakness, light-headedness, numbness and headaches.  Psychiatric/Behavioral: Positive for dysphoric mood. Negative for agitation, confusion, decreased concentration, self-injury, sleep disturbance and suicidal ideas. The patient is not nervous/anxious.     History Past Medical History:  Diagnosis Date  . Allergy   . Arthritis   . Blood in stool   . Brain cyst   . Chest pain   . Constipation   . Diabetes mellitus without complication ([place])   . Difficulty urinating   . GERD (gastroesophageal reflux disease)   . Headache(784.0)   . Hearing loss   . Hyperlipidemia   . Hypertension   . Leg swelling   . Nasal congestion   . PONV (postoperative nausea and vomiting)   . Rectal pain   . Rectal ulcer with bleeding after hemorrhoid banding 02/11/2015  . Sleep apnea   . Stroke ([place])   . Stroke ([place])   . Thrombosed hemorrhoids   . Trouble swallowing     He has a past surgical history that includes Cataract extraction; Inner ear surgery; Eye surgery ([place]); Inner ear surgery (1992); surgery - right arm; and Flexible sigmoidoscopy (N/A, 02/11/2015).   His family history includes Breast cancer (age of onset: 50) in his mother; Heart attack in his father; Heart disease in his father; Hypertension in his brother and father; Stroke in his father.He reports that  has never smoked. he has never used smokeless tobacco. He reports that he does not drink alcohol or use drugs.  Current Outpatient Medications on File Prior to Visit  Medication Sig Dispense Refill  . allopurinol (ZYLOPRIM) 100 MG tablet TAKE 1 TABLET BY MOUTH EVERY DAY 30 tablet 3  . amLODipine (NORVASC) 5 MG tablet TAKE 1 TABLET BY MOUTH EVERY DAY 30 tablet 2  . Continuous Blood Gluc Receiver (FREESTYLE LIBRE 14 DAY READER) DEVI 1 Device by Does not apply route daily. 1 Device 0  . Continuous Blood Gluc Sensor (FREESTYLE LIBRE 14 DAY SENSOR) MISC Inject 1 Device into the skin every 14 (fourteen) days. 2 each 3  . [employer] ASPIRIN 81 MG chewable tablet CHEW 1 TABLET BY MOUTH DAILY. 90 tablet 3  . diphenhydrAMINE (BENADRYL) 25 MG tablet     . doxycycline (VIBRA-TABS) 100 MG tablet Take 1 tablet (100 mg total) by mouth 2 (two) times daily. 20 tablet 0  . empagliflozin (JARDIANCE) 25 MG TABS tablet Take 25 mg by mouth daily. 90 tablet 3  . FIBER SELECT GUMMIES PO Take 1 tablet by mouth daily.    Marland Kitchen gabapentin (NEURONTIN) 100 MG capsule Take 1 capsule (100 mg total)  by mouth 3 (three) times daily. 90 capsule 3  . gentamicin cream (GARAMYCIN) 0.1 % Apply 1 application topically 3 (three) times daily. 30 g 1  . glucose blood (FREESTYLE PRECISION NEO TEST) test strip Use as instructed to check sugar 4-5 times daily 500 each 5  . glucose blood test strip Check blood sugars four times daily 400 each 12  . GuaiFENesin 200 MG/10ML SOLN     . ibuprofen (ADVIL) 200 MG tablet     . Lancets (ACCU-CHEK SOFT TOUCH) lancets Check blood sugars four times daily 400 each 12  . loratadine (CLARITIN)  10 MG tablet Take 10 mg by mouth daily.    . mometasone (ELOCON) 0.1 % ointment APPLY TO AFFECTED AREA EVERY DAY AS NEEDED FOR RASH 45 g 0  . Multiple Vitamins-Minerals (AIRB[place]NE GUMMIES PO) Take 1 each by mouth daily.    . ondansetron (ZOFRAN-ODT) 4 MG disintegrating tablet USE UP TO THREE TIMES A DAY AS NEEDED FOR NAUSEA. 10 tablet 0  . pravastatin (PRAVACHOL) 20 MG tablet Take 1 tablet (20 mg total) by mouth daily. 90 tablet 0  . sitaGLIPtin (JANUVIA) 100 MG tablet Take 1 tablet (100 mg total) by mouth daily. 90 tablet 0  . Triamcinolone Acetonide (NASACORT ALLERGY 24HR NA) Place into the nose.    . Blood Glucose Monitoring Suppl (ACCU-CHEK GUIDE) w/Device KIT 1 Device by Does not apply route once. Check blood sugars four times daily 1 kit 0   No current facility-administered medications on file prior to visit.      Objective:  Objective  Physical Exam  Constitutional: He is oriented to person, place, and time. He appears well-developed and well-nourished. No distress.  HENT:  Head: Normocephalic and atraumatic.  Right Ear: External ear normal.  Left Ear: External ear normal.  Nose: Nose normal.  Mouth/Throat: Oropharynx is clear and moist. No oropharyngeal exudate.  Eyes: Conjunctivae and EOM are normal. Pupils are equal, round, and reactive to light. Right eye exhibits no discharge. Left eye exhibits no discharge.  Neck: Normal range of motion. Neck supple. No JVD present. No thyromegaly present.  Cardiovascular: Normal rate, regular rhythm and intact distal pulses. Exam reveals no gallop and no friction rub.  No murmur heard. Pulmonary/Chest: Effort normal and breath sounds normal. No respiratory distress. He has no wheezes. He has no rales. He exhibits no tenderness.  Abdominal: Soft. Bowel sounds are normal. He exhibits no distension and no mass. There is no tenderness. There is no rebound and no guarding.  Genitourinary:  Genitourinary Comments: Per urology and surgery    Musculoskeletal: Normal range of motion. He exhibits no edema or tenderness.  Lymphadenopathy:    He has no cervical adenopathy.  Neurological: He is alert and oriented to person, place, and time. He displays normal reflexes. He exhibits normal muscle tone.  Skin: Skin is warm and dry. No rash noted. He is not diaphoretic. No erythema. No pallor.  Psychiatric: His behavior is normal. Judgment and thought content normal. His affect is angry. He exhibits a depressed mood. He expresses no homicidal and no suicidal ideation. He expresses no suicidal plans and no homicidal plans.   BP 120/78 (BP Location: Left Arm, Cuff Size: Normal)   Pulse 80   Temp 98 F (36.7 C) (Oral)   Resp 16   Ht '5\' 11"'$  (1.803 m)   Wt 185 lb 3.2 oz (84 kg)   SpO2 98%   BMI 25.83 kg/m  Wt Readings from Last 3 Encounters:  08/04/17 185 lb 3.2 oz (84 kg)  06/18/17 186 lb 9.6 oz (84.6 kg)  03/18/17 187 lb (84.8 kg)     Lab Results  Component Value Date   WBC 9.0 02/19/2017   HGB 15.0 02/19/2017   HCT 44.9 02/19/2017   PLT 337.0 02/19/2017   GLUCOSE 98 02/19/2017   CHOL 163 02/19/2017   TRIG 151.0 (H) 02/19/2017   HDL 32.80 (L) 02/19/2017   LDLDIRECT 148.2 06/03/2013   LDLCALC 100 (H) 02/19/2017   ALT 18 02/19/2017   AST 17 02/19/2017   NA 140 02/19/2017   K 3.7 02/19/2017   CL 101 02/19/2017   CREATININE 1.08 02/19/2017   BUN 13 02/19/2017   CO2 29 02/19/2017   TSH 2.89 02/19/2017   PSA 1.01 10/03/2014   INR 1.11 02/10/2015   HGBA1C 7.8 06/19/2017   MICROALBUR 1.3 11/13/2015    US Venous Img Upper Uni Right  Result Date: 02/14/2015 CLINICAL DATA:  Right arm and neck pain, recent hospitalization EXAM: Right UPPER EXTREMITY VENOUS DOPPLER ULTRASOUND TECHNIQUE: Gray-scale sonography with graded compression, as well as color Doppler and duplex ultrasound were performed to evaluate the upper extremity deep venous system from the level of the subclavian vein and including the jugular, axillary,  basilic, radial, ulnar and upper cephalic vein. Spectral Doppler was utilized to evaluate flow at rest and with distal augmentation maneuvers. COMPARISON:  None. FINDINGS: Contralateral Subclavian Vein: Respiratory phasicity is normal and symmetric with the symptomatic side. No evidence of thrombus. Normal compressibility. Internal Jugular Vein: No evidence of thrombus. Normal compressibility, respiratory phasicity and response to augmentation. Subclavian Vein: No evidence of thrombus. Normal compressibility, respiratory phasicity and response to augmentation. Axillary Vein: No evidence of thrombus. Normal compressibility, respiratory phasicity and response to augmentation. Cephalic Vein: No evidence of thrombus. Normal compressibility, respiratory phasicity and response to augmentation. Basilic Vein: No evidence of thrombus. Normal compressibility, respiratory phasicity and response to augmentation. Brachial Veins: No evidence of thrombus. Normal compressibility, respiratory phasicity and response to augmentation. Radial Veins: No evidence of thrombus. Normal compressibility, respiratory phasicity and response to augmentation. Ulnar Veins: No evidence of thrombus. Normal compressibility, respiratory phasicity and response to augmentation. Venous Reflux:  None visualized. Other Findings:  None visualized. IMPRESSION: No evidence of deep venous thrombosis. Electronically Signed   By: Conchita Paris M.D.   On: 02/14/2015 16:30     Assessment & Plan:  Plan  I have discontinued Chase [name]'s linagliptin, venlafaxine XR, and pioglitazone. I am also having him start on venlafaxine XR. Additionally, I am having him maintain his [place] PO, [employer]ENS ASPIRIN, accu-chek soft touch, glucose blood, ACCU-CHEK GUIDE, ondansetron, ibuprofen, diphenhydrAMINE, GuaiFENesin, mometasone, glucose blood, pravastatin, doxycycline, Triamcinolone Acetonide (NASACORT ALLERGY 24HR NA), loratadine, FREESTYLE LIBRE  14 DAY READER, FREESTYLE LIBRE 14 DAY SENSOR, allopurinol, empagliflozin, sitaGLIPtin, amLODipine, gabapentin, gentamicin cream, Multiple Vitamins-Minerals (AIRBORNE GUMMIES PO), Azelastine HCl, PROCTOFOAM HC, polyethylene glycol powder, ranitidine, metoprolol tartrate, hydrOXYzine, cyclobenzaprine, and HYDROcodone-acetaminophen.  Meds ordered this encounter  Medications  . Azelastine HCl 0.15 % SOLN    Sig: PLACE 2 SPRAYS IN EACH NOSTRIL every night    Dispense:  30 mL    Refill:  11  . DISCONTD: HYDROcodone-acetaminophen (HYCET) 7.5-325 mg/15 ml solution    Sig: TAKE 20 ML EVERY 6 HOURS AS NEEDED FOR PAIN    Dispense:  473 mL    Refill:  0  . PROCTOFOAM HC rectal foam    Sig: APPLY FOAM THREE TIMES A DAY AS  NEEDED FOR 7 DAYS    Dispense:  10 g    Refill:  10  . polyethylene glycol powder (GLYCOLAX/MIRALAX) powder    Sig: TAKE 17 GRAMS BY MOUTH 2 TIMES DAILY AS NEEDED.    Dispense:  527 g    Refill:  5  . ranitidine (ZANTAC) 75 MG/5ML syrup    Sig: TAKE 10 MLS BY MOUTH 2 TIMES DAILY AS NEEDED. FOR INDIGESTION.    Dispense:  300 mL    Refill:  1  . venlafaxine XR (EFFEXOR XR) 75 MG 24 hr capsule    Sig: Take 1 capsule (75 mg total) by mouth daily with breakfast.    Dispense:  90 capsule    Refill:  1  . metoprolol tartrate (LOPRESSOR) 25 MG tablet    Sig: TAKE 1 TABLET BY MOUTH TWICE A DAY -MAY CRUSH IN FOOD    Dispense:  60 tablet    Refill:  4  . hydrOXYzine (ATARAX/VISTARIL) 25 MG tablet    Sig: TAKE 1 TABLET BY MOUTH EVERY 8 HOURS AS NEEDED FOR ANXIETY OR ITCHING.    Dispense:  90 tablet    Refill:  0  . cyclobenzaprine (FLEXERIL) 10 MG tablet    Sig: TAKE 1 TABLET BY MOUTH AT BEDTIME AS NEEDED FOR MUSCLE SPASMS.    Dispense:  30 tablet    Refill:  0  . HYDROcodone-acetaminophen (HYCET) 7.5-325 mg/15 ml solution    Sig: TAKE 20 ML EVERY 6 HOURS AS NEEDED FOR PAIN    Dispense:  473 mL    Refill:  0    Problem List Items Addressed This Visit      Unprioritized    Annual physical exam    ghm utd Check labs  See AVS      Arthralgia   Relevant Medications   cyclobenzaprine (FLEXERIL) 10 MG tablet   HYDROcodone-acetaminophen (HYCET) 7.5-325 mg/15 ml solution   Other Relevant Orders   Pain Mgmt, Profile 8 w/Conf, U   Comprehensive metabolic panel   CBC with Differential/Platelet   Lipid panel   Comprehensive metabolic panel   CBC with Differential/Platelet   Lipid panel   GERD   Relevant Medications   polyethylene glycol powder (GLYCOLAX/MIRALAX) powder   ranitidine (ZANTAC) 75 MG/5ML syrup   Hemorrhoid   Relevant Medications   PROCTOFOAM HC rectal foam   polyethylene glycol powder (GLYCOLAX/MIRALAX) powder   metoprolol tartrate (LOPRESSOR) 25 MG tablet   HTN (hypertension)   Relevant Medications   metoprolol tartrate (LOPRESSOR) 25 MG tablet    Other Visit Diagnoses    High risk medication use    -  Primary   Relevant Orders   Pain Mgmt, Profile 8 w/Conf, U   Comprehensive metabolic panel   Seasonal allergies       Relevant Medications   Azelastine HCl 0.15 % SOLN   Other Relevant Orders   Comprehensive metabolic panel   CBC with Differential/Platelet   Lipid panel   Other chronic pain       Relevant Medications   venlafaxine XR (EFFEXOR XR) 75 MG 24 hr capsule   cyclobenzaprine (FLEXERIL) 10 MG tablet   HYDROcodone-acetaminophen (HYCET) 7.5-325 mg/15 ml solution   Other Relevant Orders   Comprehensive metabolic panel   CBC with Differential/Platelet   Lipid panel   DM (diabetes mellitus) type II, controlled, with peripheral vascular disorder (HCC)       Relevant Medications   metoprolol tartrate (LOPRESSOR) 25 MG tablet   Other Relevant Orders  Microalbumin / creatinine urine ratio   Comprehensive metabolic panel   CBC with Differential/Platelet   Lipid panel   Depression, major, single episode, severe (HCC)       Relevant Medications   venlafaxine XR (EFFEXOR XR) 75 MG 24 hr capsule   hydrOXYzine (ATARAX/VISTARIL)  25 MG tablet   Suspicious nevus       Relevant Orders   Ambulatory referral to Dermatology   Gout, unspecified cause, unspecified chronicity, unspecified site       Relevant Orders   Uric acid   ANA positive          Follow-up: Return in about 6 months (around 02/01/2018), or if symptoms worsen or fail to improve.  Ann Held, DO

## 2017-08-04 NOTE — Assessment & Plan Note (Signed)
ghm utd Check labs See AVS 

## 2017-08-05 ENCOUNTER — Encounter: Payer: Self-pay | Admitting: Internal Medicine

## 2017-08-05 NOTE — Progress Notes (Signed)
   Subjective: Patient presents today 2 weeks post ingrown nail temporary nail avulsion procedure of the lateral border of the left great toe. He reports some associated erythema but states that the toe and nail fold is feeling much better. He was using the gentamicin cream daily until yesterday. Patient is here for further evaluation and treatment.   Past Medical History:  Diagnosis Date  . Allergy   . Arthritis   . Blood in stool   . Brain cyst   . Chest pain   . Constipation   . Diabetes mellitus without complication ([place])   . Difficulty urinating   . GERD (gastroesophageal reflux disease)   . Headache(784.0)   . Hearing loss   . Hyperlipidemia   . Hypertension   . Leg swelling   . Nasal congestion   . PONV (postoperative nausea and vomiting)   . Rectal pain   . Rectal ulcer with bleeding after hemorrhoid banding 02/11/2015  . Sleep apnea   . Stroke ([place])   . Stroke ([place])   . Thrombosed hemorrhoids   . Trouble swallowing     Objective: Skin is warm, dry and supple. Nail and respective nail fold appears to be healing appropriately. Open wound to the associated nail fold with a granular wound base and moderate amount of fibrotic tissue. Minimal drainage noted.    Assessment: #1 postop temporary partial nail avulsion lateral border left great toe #2 open wound periungual nail fold of respective digit.  #3 Diabetes Mellitus with neuropathy   Plan of care: #1 patient was evaluated  #2 debridement of open wound was performed to the periungual border of the respective toe using a currette. Antibiotic ointment and Band-Aid was applied. #3 Continue taking Gabapentin 100 mg three times daily. #4 patient is to return to clinic on a PRN basis.   Edrick Kins, DPM [facility]  Dr. Edrick Kins, [address]                                        [place], [place] [address]                Office [phone]  Fax [phone]

## 2017-08-06 LAB — PAIN MGMT, PROFILE 8 W/CONF, U
6 Acetylmorphine: NEGATIVE ng/mL (ref <10)
Alcohol Metabolites: NEGATIVE ng/mL (ref <500)
Amphetamines: NEGATIVE ng/mL (ref <500)
Benzodiazepines: NEGATIVE ng/mL (ref <100)
Buprenorphine, Urine: NEGATIVE ng/mL (ref <5)
Cocaine Metabolite: NEGATIVE ng/mL (ref <150)
Creatinine: 65.3 mg/dL
MDMA: NEGATIVE ng/mL (ref <500)
Marijuana Metabolite: NEGATIVE ng/mL (ref <20)
Opiates: NEGATIVE ng/mL (ref <100)
Oxidant: NEGATIVE ug/mL (ref <200)
Oxycodone: NEGATIVE ng/mL (ref <100)
pH: 7.01 (ref 4.5–9.0)

## 2017-08-17 ENCOUNTER — Telehealth: Payer: Self-pay

## 2017-08-17 ENCOUNTER — Encounter: Payer: Self-pay | Admitting: Internal Medicine

## 2017-08-17 NOTE — Telephone Encounter (Signed)
Called and did a Peer to Peer with [place] for freestyle libre sensors based on pt having Raynauds disease. Still denied. Sending to medical director who will respond by fax.

## 2017-08-21 DIAGNOSIS — G4733 Obstructive sleep apnea (adult) (pediatric): Secondary | ICD-10-CM | POA: Diagnosis not present

## 2017-08-25 ENCOUNTER — Encounter: Payer: Self-pay | Admitting: Internal Medicine

## 2017-08-27 DIAGNOSIS — L918 Other hypertrophic disorders of the skin: Secondary | ICD-10-CM | POA: Diagnosis not present

## 2017-08-27 DIAGNOSIS — L570 Actinic keratosis: Secondary | ICD-10-CM | POA: Diagnosis not present

## 2017-08-27 DIAGNOSIS — D225 Melanocytic nevi of trunk: Secondary | ICD-10-CM | POA: Diagnosis not present

## 2017-08-27 DIAGNOSIS — L821 Other seborrheic keratosis: Secondary | ICD-10-CM | POA: Diagnosis not present

## 2017-08-28 ENCOUNTER — Other Ambulatory Visit: Payer: Self-pay | Admitting: *Deleted

## 2017-08-28 ENCOUNTER — Telehealth: Payer: Self-pay

## 2017-08-28 DIAGNOSIS — M791 Myalgia, unspecified site: Secondary | ICD-10-CM

## 2017-08-28 DIAGNOSIS — I7300 Raynaud's syndrome without gangrene: Secondary | ICD-10-CM

## 2017-08-28 DIAGNOSIS — M2550 Pain in unspecified joint: Secondary | ICD-10-CM

## 2017-08-28 NOTE — Telephone Encounter (Signed)
THANK YOU SO MUCH!

## 2017-08-28 NOTE — Telephone Encounter (Signed)
Januvia 100MG   [facility]#[id number] Approval date(s): 08/28/2017-08/23/2018 Call interaction id#: [id number]  Called patient and informed PA approved.

## 2017-09-04 ENCOUNTER — Encounter: Payer: Self-pay | Admitting: Family Medicine

## 2017-09-06 ENCOUNTER — Other Ambulatory Visit: Payer: Self-pay | Admitting: Family Medicine

## 2017-09-06 DIAGNOSIS — M25511 Pain in right shoulder: Secondary | ICD-10-CM

## 2017-09-06 DIAGNOSIS — K219 Gastro-esophageal reflux disease without esophagitis: Secondary | ICD-10-CM

## 2017-09-08 NOTE — Telephone Encounter (Signed)
Referral was sent to [facility],

## 2017-09-17 ENCOUNTER — Encounter: Payer: Self-pay | Admitting: Internal Medicine

## 2017-09-17 ENCOUNTER — Ambulatory Visit (INDEPENDENT_AMBULATORY_CARE_PROVIDER_SITE_OTHER): Payer: BLUE CROSS/BLUE SHIELD | Admitting: Internal Medicine

## 2017-09-17 VITALS — BP 104/62 | HR 82 | Ht 71.0 in | Wt 187.0 lb

## 2017-09-17 DIAGNOSIS — E785 Hyperlipidemia, unspecified: Secondary | ICD-10-CM | POA: Diagnosis not present

## 2017-09-17 DIAGNOSIS — E1151 Type 2 diabetes mellitus with diabetic peripheral angiopathy without gangrene: Secondary | ICD-10-CM | POA: Diagnosis not present

## 2017-09-17 DIAGNOSIS — E1165 Type 2 diabetes mellitus with hyperglycemia: Secondary | ICD-10-CM

## 2017-09-17 DIAGNOSIS — IMO0002 Reserved for concepts with insufficient information to code with codable children: Secondary | ICD-10-CM

## 2017-09-17 LAB — POCT GLYCOSYLATED HEMOGLOBIN (HGB A1C): Hemoglobin A1C: 7.4

## 2017-09-17 MED ORDER — GLUCOSE BLOOD VI STRP: ORAL_STRIP | 4 refills | Status: DC

## 2017-09-17 MED ORDER — BAYER MICROLET LANCETS MISC: 4 refills | Status: DC

## 2017-09-17 NOTE — Patient Instructions (Signed)
Please continue: - Jardiance 25 mg in am - Januvia 100 mg in am  Please start checking sugars 1x a day, rotating checks.  Please come back for a follow-up appointment in 3 months.

## 2017-09-17 NOTE — Progress Notes (Signed)
Patient ID: Chase [name], male   DOB: 1961/01/09, 57 y.o.   MRN: [id number]  HPI: Chase [name] is a 57 y.o.-year-old male, returning for follow-up for DM2, dx in 2014, non-insulin-dependent, uncontrolled, with complications (PVD, cerebro-vascular ds - s/p "mini"strokes, PN). Last visit 3 mo ago.  Last hemoglobin A1c was: Lab Results  Component Value Date   HGBA1C 7.8 06/19/2017   HGBA1C 7.1 (H) 02/19/2017   HGBA1C 7.4 02/21/2016   Pt is on a regimen of: - Jardiance 25 mg daily in am - started 04/2016 - Januvia 100 mg daily in am - started 10/2015 >> off 2/2 insurance coverage >> now back on this in last month Stopped Glipizide b/c rash. Stopped Invokana 100 mg in am >> only got this for 1 mo He was on Metformin 500 mg po bid - in liquid form, as he could not swallow pills. He gets gi upset. He stopped this in 2014.  Pt was checking his sugars frequently with his Freestyle libre CGM >> now off, as not covered.  He is now not checking CBGs as he now he does not have a meter. From last visit: - fasting: ave 140 -150s >> brunch:  152-194 >> 130-150  - 2h after lunch: n/c >> ave 158 >> 312 >> 150-220 - before dinner: 130 >> ave 127 >> 216 >> 150-200 - 2h after dinner: n/c >> ave 183 >> 191-273 >> 180-210 - bedtime: 172-214 >> 208 >> see above - nighttime: n/c Lowest sugar was 120 >> ?; he has hypoglycemia awareness at 70s.  Highest sugar was 300s (at night) >> ?  Glucometer: [employer] Next  - + mild CKD, last BUN/creatinine:  Lab Results  Component Value Date   BUN 17 08/04/2017   BUN 13 02/19/2017   CREATININE 1.09 08/04/2017   CREATININE 1.08 02/19/2017   - + HL.  last set of lipids: Lab Results  Component Value Date   CHOL 159 08/04/2017   HDL 35.40 (L) 08/04/2017   LDLCALC 102 (H) 08/04/2017   LDLDIRECT 148.2 06/03/2013   TRIG 106.0 08/04/2017   CHOLHDL 4 08/04/2017  On Pravastatin 20. 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 He has anxiety, PTSD from childhood. Has varicose veins >> wears compresion hoses. He also has a h/o kidney stones.  ROS: Constitutional: no weight gain/no weight loss, no fatigue, no subjective hyperthermia, no subjective hypothermia Eyes: no blurry vision, no xerophthalmia ENT: no sore throat, no nodules palpated in throat, no dysphagia, no odynophagia, no hoarseness Cardiovascular: no CP/no SOB/no palpitations/no leg swelling Respiratory: no cough/no SOB/no wheezing Gastrointestinal: no N/no V/no D/no C/no acid reflux Musculoskeletal: no muscle aches/no joint aches Skin: no rashes, no hair loss Neurological: no tremors/no numbness/no tingling/no dizziness  I reviewed pt's medications, allergies, PMH, social hx, family hx, and changes were documented in the history of present illness. Otherwise, unchanged from my initial visit note.  Past Medical History:  Diagnosis Date  . Allergy   . Arthritis   . Blood in stool   . Brain cyst   . Chest pain   . Constipation   . Diabetes mellitus without complication ([place])   . Difficulty urinating   . GERD (gastroesophageal reflux disease)   . Headache(784.0)   . Hearing loss   .  Hyperlipidemia   . Hypertension   . Leg swelling   . Nasal congestion   . PONV (postoperative nausea and vomiting)   . Rectal pain   . Rectal ulcer with bleeding after hemorrhoid banding 02/11/2015  . Sleep apnea   . Stroke ([place])   . Stroke ([place])   . Thrombosed hemorrhoids   . Trouble swallowing    Past Surgical History:  Procedure Laterality Date  . CATARACT EXTRACTION     x 3  . [place]   cataracts  . FLEXIBLE SIGMOIDOSCOPY N/A 02/11/2015   Procedure: FLEXIBLE SIGMOIDOSCOPY;  Surgeon: Gatha Mayer, MD;  Location: [facility];  Service: Endoscopy;  Laterality: N/A;  . INNER EAR SURGERY      rt ear  . INNER EAR SURGERY  1992  . surgery - right arm     1983   Social History   Socioeconomic History  . Marital status: Single    Spouse name: Not on file  . Number of children: 0  . Years of education: Not on file  . Highest education level: Not on file  Occupational History  . Not on file  Social Needs  . Financial resource strain: Not on file  . Food insecurity:    Worry: Not on file    Inability: Not on file  . Transportation needs:    Medical: Not on file    Non-medical: Not on file  Tobacco Use  . Smoking status: Never Smoker  . Smokeless tobacco: Never Used  Substance and Sexual Activity  . Alcohol use: No    Alcohol/week: 0.0 oz    Comment: none  . Drug use: No  . Sexual activity: Never  Lifestyle  . Physical activity:    Days per week: Not on file    Minutes per session: Not on file  . Stress: Not on file  Relationships  . Social connections:    Talks on phone: Not on file    Gets together: Not on file    Attends religious service: Not on file    Active member of club or organization: Not on file    Attends meetings of clubs or organizations: Not on file    Relationship status: Not on file  . Intimate partner violence:    Fear of current or ex partner: Not on file    Emotionally abused: Not on file    Physically abused: Not on file    Forced sexual activity: Not on file  Other Topics Concern  . Not on file  Social History Narrative   No caffeine intake except for chocolate.  Exercised-walking   Current Outpatient Medications on File Prior to Visit  Medication Sig Dispense Refill  . allopurinol (ZYLOPRIM) 100 MG tablet TAKE 1 TABLET BY MOUTH EVERY DAY 30 tablet 3  . amLODipine (NORVASC) 5 MG tablet TAKE 1 TABLET BY MOUTH EVERY DAY 30 tablet 2  . Azelastine HCl 0.15 % SOLN PLACE 2 SPRAYS IN EACH NOSTRIL every night 30 mL 11  . Blood Glucose Monitoring Suppl (ACCU-CHEK GUIDE) w/Device KIT 1 Device by Does not apply route once. Check blood sugars  four times daily 1 kit 0  . Continuous Blood Gluc Receiver (FREESTYLE LIBRE 14 DAY READER) DEVI 1 Device by Does not apply route daily. 1 Device 0  . Continuous Blood Gluc Sensor (FREESTYLE LIBRE 14 DAY SENSOR) MISC Inject 1 Device into the skin every 14 (fourteen) days. 2 each 3  . [facility]  CHILDRENS ASPIRIN 81 MG chewable tablet CHEW 1 TABLET BY MOUTH DAILY. 90 tablet 3  . cyclobenzaprine (FLEXERIL) 10 MG tablet TAKE 1 TABLET BY MOUTH AT BEDTIME AS NEEDED FOR MUSCLE SPASMS. 30 tablet 0  . diphenhydrAMINE (BENADRYL) 25 MG tablet     . doxycycline (VIBRA-TABS) 100 MG tablet Take 1 tablet (100 mg total) by mouth 2 (two) times daily. 20 tablet 0  . empagliflozin (JARDIANCE) 25 MG TABS tablet Take 25 mg by mouth daily. 90 tablet 3  . FIBER SELECT GUMMIES PO Take 1 tablet by mouth daily.    Marland Kitchen gabapentin (NEURONTIN) 100 MG capsule Take 1 capsule (100 mg total) by mouth 3 (three) times daily. 90 capsule 3  . gentamicin cream (GARAMYCIN) 0.1 % Apply 1 application topically 3 (three) times daily. 30 g 1  . glucose blood (FREESTYLE PRECISION NEO TEST) test strip Use as instructed to check sugar 4-5 times daily 500 each 5  . glucose blood test strip Check blood sugars four times daily 400 each 12  . GuaiFENesin 200 MG/10ML SOLN     . HYDROcodone-acetaminophen (HYCET) 7.5-325 mg/15 ml solution TAKE 20 ML EVERY 6 HOURS AS NEEDED FOR PAIN 473 mL 0  . hydrOXYzine (ATARAX/VISTARIL) 25 MG tablet TAKE 1 TABLET BY MOUTH EVERY 8 HOURS AS NEEDED FOR ANXIETY OR ITCHING. 90 tablet 0  . ibuprofen (ADVIL) 200 MG tablet     . Lancets (ACCU-CHEK SOFT TOUCH) lancets Check blood sugars four times daily 400 each 12  . loratadine (CLARITIN) 10 MG tablet Take 10 mg by mouth daily.    . metoprolol tartrate (LOPRESSOR) 25 MG tablet TAKE 1 TABLET BY MOUTH TWICE A DAY -MAY CRUSH IN FOOD 60 tablet 4  . mometasone (ELOCON) 0.1 % ointment APPLY TO AFFECTED AREA EVERY DAY AS NEEDED FOR RASH 45 g 0  . Multiple Vitamins-Minerals (AIRBORNE  GUMMIES PO) Take 1 each by mouth daily.    . ondansetron (ZOFRAN-ODT) 4 MG disintegrating tablet USE UP TO THREE TIMES A DAY AS NEEDED FOR NAUSEA. 10 tablet 0  . polyethylene glycol powder (GLYCOLAX/MIRALAX) powder TAKE 17 GRAMS BY MOUTH 2 TIMES DAILY AS NEEDED. 527 g 5  . pravastatin (PRAVACHOL) 20 MG tablet Take 1 tablet (20 mg total) by mouth daily. 90 tablet 0  . PROCTOFOAM HC rectal foam APPLY FOAM THREE TIMES A DAY AS NEEDED FOR 7 DAYS 10 g 10  . ranitidine (ZANTAC) 75 MG/5ML syrup TAKE 10 MLS BY MOUTH 2 TIMES DAILY AS NEEDED. FOR INDIGESTION. 300 mL 1  . sitaGLIPtin (JANUVIA) 100 MG tablet Take 1 tablet (100 mg total) by mouth daily. 90 tablet 0  . Triamcinolone Acetonide (NASACORT ALLERGY 24HR NA) Place into the nose.    . venlafaxine XR (EFFEXOR XR) 75 MG 24 hr capsule Take 1 capsule (75 mg total) by mouth daily with breakfast. 90 capsule 1   No current facility-administered medications on file prior to visit.    Allergies  Allergen Reactions  . Amoxicillin Hives, Itching and Other (See Comments)    White tongue; ? hives  . Fluticasone     Other reaction(s): Other Sores inside nose  . Sulfa Antibiotics Rash  . Flonase [Fluticasone Propionate]     Nasal Sores  . Terfenadine     ? reaction   Family History  Problem Relation Age of Onset  . Breast cancer Mother 71       breast  . Stroke Father   . Heart disease Father   .  Hypertension Father   . Heart attack Father   . Hypertension Brother   . Hyperlipidemia Neg Hx   . Diabetes Neg Hx    PE: BP 104/62   Pulse 82   Ht '5\' 11"'$  (1.803 m)   Wt 187 lb (84.8 kg)   SpO2 99%   BMI 26.08 kg/m  Wt Readings from Last 3 Encounters:  09/17/17 187 lb (84.8 kg)  08/04/17 185 lb 3.2 oz (84 kg)  06/18/17 186 lb 9.6 oz (84.6 kg)   Constitutional: overweight, in NAD Eyes: + R surgical pupil, EOMI, no exophthalmos ENT: moist mucous membranes, no thyromegaly, no cervical lymphadenopathy Cardiovascular: RRR, No MRG Respiratory:  CTA B Gastrointestinal: abdomen soft, NT, ND, BS+ Musculoskeletal: no deformities, strength intact in all 4 Skin: moist, warm, no rashes Neurological: no tremor with outstretched hands, DTR normal in all 4  ASSESSMENT: 1. DM2, non-insulin-dependent, uncontrolled, with complications - PVD - cerebro-vascular ds - s/p "mini"strokes - PN  2. HL  PLAN:  1. Patient with history of uncontrolled type 2 diabetes, previously doing well on Januvia and Jardiance, then with deteriorating control after insurance did not cover one or the other of the 2 medicines.  After multiple attempts to have them covered and multiple PAs, he finally obtained both of them, with Januvia recently approved on 08/28/2017.  Now insurance is denying him the freestyle libre CGM.  At last visit, the traces showed higher sugars after meals and also sugars above goal in the morning. - We discussed at last visit that if 1 of the other medications are not covered again, we can try Actos.  We did discuss about the possible side effects.  However, this is tier 1 med.  He cannot tolerate metformin. - at this visit, he is not checking sugars after he stopped using the Freestyle Libre CGM as it is not covered anymore - today, HbA1c is 7.4% (improved) - will continue the same regimen for now and I advised him to start checking sugars and let me know about then in 2 weeks to see if we need to change the regimen - I suggested to:  Patient Instructions  Please continue: - Jardiance 25 mg in am - Januvia 100 mg in am  Please start checking sugars 1x a day, rotating checks.  Please come back for a follow-up appointment in 3 months.  - continue checking sugars at different times of the day - check 1x a day, rotating checks - advised for yearly eye exams >> he is not UTD - Return to clinic in 3 mo with sugar log     2. HL -Slightly worse at last check in 07/2017: LDL 102, HDL decreased, triglycerides now normal. -Continues Lipitor  without side effects.  Philemon Kingdom, MD PhD [facility]

## 2017-09-23 ENCOUNTER — Encounter: Payer: Self-pay | Admitting: Internal Medicine

## 2017-09-26 ENCOUNTER — Other Ambulatory Visit: Payer: Self-pay | Admitting: Family Medicine

## 2017-09-26 DIAGNOSIS — I10 Essential (primary) hypertension: Secondary | ICD-10-CM

## 2017-09-28 ENCOUNTER — Other Ambulatory Visit: Payer: Self-pay | Admitting: Internal Medicine

## 2017-09-28 MED ORDER — GLUCOSE BLOOD VI STRP: ORAL_STRIP | 3 refills | Status: DC

## 2017-10-01 ENCOUNTER — Other Ambulatory Visit: Payer: Self-pay

## 2017-10-07 ENCOUNTER — Telehealth: Payer: Self-pay

## 2017-10-07 NOTE — Telephone Encounter (Signed)
Spoke to patient. Confirmed test strips received.

## 2017-10-17 ENCOUNTER — Other Ambulatory Visit: Payer: Self-pay | Admitting: Family Medicine

## 2017-10-17 DIAGNOSIS — M25511 Pain in right shoulder: Secondary | ICD-10-CM

## 2017-10-20 NOTE — Telephone Encounter (Signed)
Do you want to refill for patient?

## 2017-11-15 ENCOUNTER — Other Ambulatory Visit: Payer: Self-pay | Admitting: Family Medicine

## 2017-11-15 DIAGNOSIS — M25511 Pain in right shoulder: Secondary | ICD-10-CM

## 2017-11-19 ENCOUNTER — Other Ambulatory Visit: Payer: Self-pay | Admitting: Family Medicine

## 2017-11-19 ENCOUNTER — Encounter: Payer: Self-pay | Admitting: Family Medicine

## 2017-11-19 DIAGNOSIS — M25511 Pain in right shoulder: Secondary | ICD-10-CM

## 2017-11-19 DIAGNOSIS — G8929 Other chronic pain: Secondary | ICD-10-CM

## 2017-11-19 DIAGNOSIS — M2550 Pain in unspecified joint: Secondary | ICD-10-CM

## 2017-11-19 MED ORDER — HYDROCODONE-ACETAMINOPHEN 7.5-325 MG/15ML PO SOLN
ORAL | 0 refills | Status: DC
Start: 2017-11-19 — End: 2018-03-08

## 2017-11-19 NOTE — Telephone Encounter (Signed)
Database ran and is on your desk for review.  Last filled per database: 08/04/17 Last written: 08/04/17 Last ov: 08/04/17 Next ov: none Contract: 08/04/18 UDS: 02/07/18

## 2017-11-26 ENCOUNTER — Telehealth: Payer: Self-pay | Admitting: *Deleted

## 2017-11-26 DIAGNOSIS — K648 Other hemorrhoids: Secondary | ICD-10-CM | POA: Diagnosis not present

## 2017-11-26 DIAGNOSIS — Z8673 Personal history of transient ischemic attack (TIA), and cerebral infarction without residual deficits: Secondary | ICD-10-CM | POA: Diagnosis not present

## 2017-11-26 MED ORDER — GABAPENTIN 100 MG PO CAPS
100.0000 mg | ORAL_CAPSULE | Freq: Three times a day (TID) | ORAL | 3 refills | Status: DC
Start: 2017-11-26 — End: 2018-07-07

## 2017-11-26 NOTE — Telephone Encounter (Signed)
Refill request for gabapentin. Dr. Amalia Hailey states refill as previously.

## 2017-12-03 ENCOUNTER — Telehealth: Payer: Self-pay | Admitting: *Deleted

## 2017-12-03 NOTE — Telephone Encounter (Signed)
Received Medical/Surgical Clearance Referral and OV notes from [facility]; forwarded to scheduler to make appointment/SLS 06/20

## 2017-12-10 ENCOUNTER — Ambulatory Visit (INDEPENDENT_AMBULATORY_CARE_PROVIDER_SITE_OTHER): Payer: BLUE CROSS/BLUE SHIELD | Admitting: Family Medicine

## 2017-12-10 ENCOUNTER — Encounter: Payer: Self-pay | Admitting: Family Medicine

## 2017-12-10 VITALS — BP 110/66 | HR 85 | Temp 98.7°F | Resp 16 | Ht 71.0 in | Wt 188.8 lb

## 2017-12-10 DIAGNOSIS — M2550 Pain in unspecified joint: Secondary | ICD-10-CM

## 2017-12-10 DIAGNOSIS — H6520 Chronic serous otitis media, unspecified ear: Secondary | ICD-10-CM

## 2017-12-10 DIAGNOSIS — Z9109 Other allergy status, other than to drugs and biological substances: Secondary | ICD-10-CM

## 2017-12-10 DIAGNOSIS — Z01818 Encounter for other preprocedural examination: Secondary | ICD-10-CM

## 2017-12-10 DIAGNOSIS — K648 Other hemorrhoids: Secondary | ICD-10-CM

## 2017-12-10 DIAGNOSIS — K649 Unspecified hemorrhoids: Secondary | ICD-10-CM | POA: Diagnosis not present

## 2017-12-10 DIAGNOSIS — G4733 Obstructive sleep apnea (adult) (pediatric): Secondary | ICD-10-CM | POA: Diagnosis not present

## 2017-12-10 DIAGNOSIS — E785 Hyperlipidemia, unspecified: Secondary | ICD-10-CM | POA: Diagnosis not present

## 2017-12-10 MED ORDER — BUDESONIDE 32 MCG/ACT NA SUSP: 1.0000 | Freq: Every day | NASAL | 5 refills | Status: DC

## 2017-12-10 MED ORDER — LORATADINE 10 MG PO TABS: 10.0000 mg | ORAL_TABLET | Freq: Every day | ORAL | 11 refills | Status: DC

## 2017-12-10 MED ORDER — POLYETHYLENE GLYCOL 3350 17 GM/SCOOP PO POWD: ORAL | 5 refills | Status: DC

## 2017-12-10 NOTE — Progress Notes (Signed)
Subjective:    Chase [name] is a 57 y.o. male who presents to the office today for a preoperative consultation at the request of surgeon Lindon Romp, MD who plans on performing excisional hemorrhoidectomy on 12/25/2017. This consultation is requested for the specific conditions prompting preoperative evaluation (i.e. because of potential affect on operative risk): hx stroke and memory problems. Planned anesthesia: general. The patient has the following known anesthesia issues: no problem. Patients bleeding risk: no recent abnormal bleeding.  The following portions of the patient's history were reviewed and updated as appropriate:  He  has a past medical history of Allergy, Arthritis, Blood in stool, Brain cyst, Chest pain, Constipation, Diabetes mellitus without complication ([place]), Difficulty urinating, GERD (gastroesophageal reflux disease), Headache(784.0), Hearing loss, Hyperlipidemia, Hypertension, Leg swelling, Nasal congestion, PONV (postoperative nausea and vomiting), Rectal pain, Rectal ulcer with bleeding after hemorrhoid banding (02/11/2015), Sleep apnea, Stroke [facility]), Stroke [facility]), Thrombosed hemorrhoids, and Trouble swallowing. He does not have any pertinent problems on file. He  has a past surgical history that includes Cataract extraction; Inner ear surgery; Eye surgery ([place]); Inner ear surgery (1992); surgery - right arm; and Flexible sigmoidoscopy (N/A, 02/11/2015). His family history includes Breast cancer (age of onset: 49) in his mother; Heart attack in his father; Heart disease in his father; Huntington's disease in his mother; Hypertension in his brother and father; Stroke in his father. He  reports that he has never smoked. He has never used smokeless tobacco. He reports that he does not drink alcohol or use drugs. He has a current medication list which includes the following prescription(s): allopurinol, amlodipine, azelastine hcl, bayer microlet lancets,  cyclobenzaprine, diphenhydramine, doxycycline, empagliflozin, fiber, gabapentin, gentamicin cream, glucose blood, guaifenesin, hydrocodone-acetaminophen, hydroxyzine, ibuprofen, loratadine, metoprolol tartrate, mometasone, multiple vitamins-minerals, ondansetron, polyethylene glycol powder, pravastatin, proctofoam hc, ranitidine, sitagliptin, triamcinolone acetonide, venlafaxine xr, and [facility] childrens aspirin. Current Outpatient Medications on File Prior to Visit  Medication Sig Dispense Refill  . allopurinol (ZYLOPRIM) 100 MG tablet TAKE 1 TABLET BY MOUTH EVERY DAY 30 tablet 3  . amLODipine (NORVASC) 5 MG tablet TAKE 1 TABLET BY MOUTH EVERY DAY 30 tablet 2  . Azelastine HCl 0.15 % SOLN PLACE 2 SPRAYS IN EACH NOSTRIL every night 30 mL 11  . BAYER MICROLET LANCETS lancets Use 1x a day 100 each 4  . cyclobenzaprine (FLEXERIL) 10 MG tablet TAKE 1 TABLET BY MOUTH AT BEDTIME AS NEEDED FOR MUSCLE SPASMS. 30 tablet 0  . diphenhydrAMINE (BENADRYL) 25 MG tablet     . doxycycline (VIBRA-TABS) 100 MG tablet Take 1 tablet (100 mg total) by mouth 2 (two) times daily. 20 tablet 0  . empagliflozin (JARDIANCE) 25 MG TABS tablet Take 25 mg by mouth daily. 90 tablet 3  . FIBER SELECT GUMMIES PO Take 1 tablet by mouth daily.    Marland Kitchen gabapentin (NEURONTIN) 100 MG capsule Take 1 capsule (100 mg total) by mouth 3 (three) times daily. 90 capsule 3  . gentamicin cream (GARAMYCIN) 0.1 % Apply 1 application topically 3 (three) times daily. 30 g 1  . glucose blood test strip Use 1x a day - E11.51, E11.65 100 each 3  . GuaiFENesin 200 MG/10ML SOLN     . HYDROcodone-acetaminophen (HYCET) 7.5-325 mg/15 ml solution TAKE 20 ML EVERY 6 HOURS AS NEEDED FOR PAIN 473 mL 0  . hydrOXYzine (ATARAX/VISTARIL) 25 MG tablet TAKE 1 TABLET BY MOUTH EVERY 8 HOURS AS NEEDED FOR ANXIETY OR ITCHING. 90 tablet 0  . ibuprofen (ADVIL) 200 MG  tablet     . loratadine (CLARITIN) 10 MG tablet Take 10 mg by mouth daily.    . metoprolol tartrate (LOPRESSOR)  25 MG tablet TAKE 1 TABLET BY MOUTH TWICE A DAY -MAY CRUSH IN FOOD 60 tablet 4  . mometasone (ELOCON) 0.1 % ointment APPLY TO AFFECTED AREA EVERY DAY AS NEEDED FOR RASH 45 g 0  . Multiple Vitamins-Minerals (AIRBORNE GUMMIES PO) Take 1 each by mouth daily.    . ondansetron (ZOFRAN-ODT) 4 MG disintegrating tablet USE UP TO THREE TIMES A DAY AS NEEDED FOR NAUSEA. 10 tablet 0  . pravastatin (PRAVACHOL) 20 MG tablet Take 1 tablet (20 mg total) by mouth daily. 90 tablet 0  . PROCTOFOAM HC rectal foam APPLY FOAM THREE TIMES A DAY AS NEEDED FOR 7 DAYS 10 g 10  . ranitidine (ZANTAC) 75 MG/5ML syrup TAKE 10 MLS BY MOUTH 2 TIMES DAILY AS NEEDED. FOR INDIGESTION. 300 mL 1  . sitaGLIPtin (JANUVIA) 100 MG tablet Take 1 tablet (100 mg total) by mouth daily. 90 tablet 0  . Triamcinolone Acetonide (NASACORT ALLERGY 24HR NA) Place into the nose.    . venlafaxine XR (EFFEXOR XR) 75 MG 24 hr capsule Take 1 capsule (75 mg total) by mouth daily with breakfast. 90 capsule 1  . [facility] CHILDRENS ASPIRIN 81 MG chewable tablet CHEW 1 TABLET BY MOUTH DAILY. (Patient not taking: Reported on 12/10/2017) 90 tablet 3   No current facility-administered medications on file prior to visit.    He is allergic to amoxicillin; fluticasone; sulfa antibiotics; flonase [fluticasone propionate]; and terfenadine..  Review of Systems Review of Systems  Constitutional: Negative for activity change, appetite change and fatigue.  HENT: Negative for hearing loss, congestion, tinnitus and ear discharge.  dentist q18m Eyes: + glasses  Respiratory: Negative for cough, chest tightness and shortness of breath.   Cardiovascular: Negative for chest pain, palpitations and leg swelling.  Gastrointestinal: Negative for abdominal pain, diarrhea, constipation and abdominal distention.  Genitourinary: Negative for urgency, frequency, decreased urine volume and difficulty urinating.  Musculoskeletal: Negative for back pain, arthralgias and gait problem.   Skin: Negative for color change, pallor and rash.  Neurological: Negative for dizziness, light-headedness, numbness and headaches.  Hematological: Negative for adenopathy. Does not bruise/bleed easily.  Psychiatric/Behavioral: Negative for suicidal ideas, confusion, sleep disturbance, self-injury, dysphoric mood, decreased concentration and agitation.        Objective:    BP 110/66 (BP Location: Left Arm, Cuff Size: Normal)   Pulse 85   Temp 98.7 F (37.1 C) (Oral)   Resp 16   Ht 5\' 11"  (1.803 m)   Wt 188 lb 12.8 oz (85.6 kg)   SpO2 98%   BMI 26.33 kg/m  General appearance: alert, cooperative, appears stated age and no distress Head: Normocephalic, without obvious abnormality, atraumatic Eyes: conjunctivae/corneas clear. PERRL, EOM's intact. Fundi benign. Ears: normal TM's and external ear canals both ears Nose: Nares normal. Septum midline. Mucosa normal. No drainage or sinus tenderness. Throat: lips, mucosa, and tongue normal; teeth and gums normal Neck: no adenopathy, no carotid bruit, no JVD, supple, symmetrical, trachea midline and thyroid not enlarged, symmetric, no tenderness/mass/nodules Back: symmetric, no curvature. ROM normal. No CVA tenderness. Lungs: clear to auscultation bilaterally Chest wall: no tenderness Heart: regular rate and rhythm, S1, S2 normal, no murmur, click, rub or gallop Abdomen: soft, non-tender; bowel sounds normal; no masses,  no organomegaly Rectal: deferred--- per surgery Extremities: extremities normal, atraumatic, no cyanosis or edema Pulses: 2+ and symmetric Skin: Skin  color, texture, turgor normal. No rashes or lesions Lymph nodes: Cervical, supraclavicular, and axillary nodes normal. Neurologic: Alert and oriented X 3, normal strength and tone. Normal symmetric reflexes. Normal coordination and gait--- forgets details about the surgery  Cardiographics ECG: normal sinus rhythm, no blocks or conduction defects, no ischemic  changes Echocardiogram: not done  ILab Review  Office Visit on 09/17/2017  Component Date Value  . Hemoglobin A1C 09/17/2017 7.4   Office Visit on 08/04/2017  Component Date Value  . Microalb, Ur 08/04/2017 0.7   . Creatinine,U 08/04/2017 70.4   . Microalb Creat Ratio 08/04/2017 1.0   . Creatinine 08/04/2017 65.3   . pH 08/04/2017 7.01   . Oxidant 08/04/2017 NEGATIVE   . Amphetamines 08/04/2017 NEGATIVE   . medMATCH Amphetamines 08/04/2017 CONSISTENT   . Benzodiazepines 08/04/2017 NEGATIVE   . medMATCH Benzodiazepines 08/04/2017 CONSISTENT   . Marijuana Metabolite 08/04/2017 NEGATIVE   . medMATCH Marijuana Metab 08/04/2017 CONSISTENT   . Cocaine Metabolite 08/04/2017 NEGATIVE   . medMATCH Cocaine Metab 08/04/2017 CONSISTENT   . Opiates 08/04/2017 NEGATIVE   . medMATCH Opiates 08/04/2017 CONSISTENT   . Oxycodone 08/04/2017 NEGATIVE   . medMATCH Oxycodone 08/04/2017 CONSISTENT   . Buprenorphine, Urine 08/04/2017 NEGATIVE   . medMATCH Buprenorphine 08/04/2017 CONSISTENT   . MDMA 08/04/2017 NEGATIVE   . medMATCH MDMA 08/04/2017 CONSISTENT   . Alcohol Metabolites 08/04/2017 NEGATIVE   . medMATCH Alcohol Metab 08/04/2017 CONSISTENT   . 6 Acetylmorphine 08/04/2017 NEGATIVE   . medMATCH 6 Acetylmorphine 08/04/2017 CONSISTENT   . Sodium 08/04/2017 138   . Potassium 08/04/2017 3.5   . Chloride 08/04/2017 101   . CO2 08/04/2017 31   . Glucose, Bld 08/04/2017 131*  . BUN 08/04/2017 17   . Creatinine, Ser 08/04/2017 1.09   . Total Bilirubin 08/04/2017 0.7   . Alkaline Phosphatase 08/04/2017 90   . AST 08/04/2017 17   . ALT 08/04/2017 22   . Total Protein 08/04/2017 7.7   . Albumin 08/04/2017 4.5   . Calcium 08/04/2017 9.6   . GFR 08/04/2017 74.13   . WBC 08/04/2017 10.1   . RBC 08/04/2017 5.01   . Hemoglobin 08/04/2017 15.1   . HCT 08/04/2017 44.4   . MCV 08/04/2017 88.5   . MCHC 08/04/2017 34.0   . RDW 08/04/2017 13.3   . Platelets 08/04/2017 354.0   . Neutrophils  Relative % 08/04/2017 71.7   . Lymphocytes Relative 08/04/2017 18.7   . Monocytes Relative 08/04/2017 7.0   . Eosinophils Relative 08/04/2017 2.3   . Basophils Relative 08/04/2017 0.3   . Neutro Abs 08/04/2017 7.2   . Lymphs Abs 08/04/2017 1.9   . Monocytes Absolute 08/04/2017 0.7   . Eosinophils Absolute 08/04/2017 0.2   . Basophils Absolute 08/04/2017 0.0   . Cholesterol 08/04/2017 159   . Triglycerides 08/04/2017 106.0   . HDL 08/04/2017 35.40*  . VLDL 08/04/2017 21.2   . LDL Cholesterol 08/04/2017 102*  . Total CHOL/HDL Ratio 08/04/2017 4   . NonHDL 08/04/2017 123.16   . Uric Acid, Serum 08/04/2017 3.2*  Office Visit on 06/18/2017  Component Date Value  . Hemoglobin A1C 06/19/2017 7.8       Assessment:      57 y.o. male with planned surgery as above.   Known risk factors for perioperative complications: None hx cva   Difficulty with intubation is not anticipated.  Cardiac Risk Estimation: low--- pt cleared for surgery    Plan:    1. Preoperative workup as  follows ECG. 2. Change in medication regimen before surgery: per surgical team.. 3 Deep vein thrombosis prophylaxis postoperatively:regimen to be chosen by surgical team. 4 Other measures: pt has trouble with Nausea and vomitting with anesthesia

## 2017-12-10 NOTE — Patient Instructions (Signed)
Hemorrhoids Hemorrhoids are swollen veins in and around the rectum or anus. There are two types of hemorrhoids:  Internal hemorrhoids. These occur in the veins that are just inside the rectum. They may poke through to the outside and become irritated and painful.  External hemorrhoids. These occur in the veins that are outside of the anus and can be felt as a painful swelling or hard lump near the anus.  Most hemorrhoids do not cause serious problems, and they can be managed with home treatments such as diet and lifestyle changes. If home treatments do not help your symptoms, procedures can be done to shrink or remove the hemorrhoids. What are the causes? This condition is caused by increased pressure in the anal area. This pressure may result from various things, including:  Constipation.  Straining to have a bowel movement.  Diarrhea.  Pregnancy.  Obesity.  Sitting for long periods of time.  Heavy lifting or other activity that causes you to strain.  Anal sex.  What are the signs or symptoms? Symptoms of this condition include:  Pain.  Anal itching or irritation.  Rectal bleeding.  Leakage of stool (feces).  Anal swelling.  One or more lumps around the anus.  How is this diagnosed? This condition can often be diagnosed through a visual exam. Other exams or tests may also be done, such as:  Examination of the rectal area with a gloved hand (digital rectal exam).  Examination of the anal canal using a small tube (anoscope).  A blood test, if you have lost a significant amount of blood.  A test to look inside the colon (sigmoidoscopy or colonoscopy).  How is this treated? This condition can usually be treated at home. However, various procedures may be done if dietary changes, lifestyle changes, and other home treatments do not help your symptoms. These procedures can help make the hemorrhoids smaller or remove them completely. Some of these procedures involve  surgery, and others do not. Common procedures include:  Rubber band ligation. Rubber bands are placed at the base of the hemorrhoids to cut off the blood supply to them.  Sclerotherapy. Medicine is injected into the hemorrhoids to shrink them.  Infrared coagulation. A type of light energy is used to get rid of the hemorrhoids.  Hemorrhoidectomy surgery. The hemorrhoids are surgically removed, and the veins that supply them are tied off.  Stapled hemorrhoidopexy surgery. A circular stapling device is used to remove the hemorrhoids and use staples to cut off the blood supply to them.  Follow these instructions at home: Eating and drinking  Eat foods that have a lot of fiber in them, such as whole grains, beans, nuts, fruits, and vegetables. Ask your health care provider about taking products that have added fiber (fiber supplements).  Drink enough fluid to keep your urine clear or pale yellow. Managing pain and swelling  Take warm sitz baths for 20 minutes, 3-4 times a day to ease pain and discomfort.  If directed, apply ice to the affected area. Using ice packs between sitz baths may be helpful. ? Put ice in a plastic bag. ? Place a towel between your skin and the bag. ? Leave the ice on for 20 minutes, 2-3 times a day. General instructions  Take over-the-counter and prescription medicines only as told by your health care provider.  Use medicated creams or suppositories as told.  Exercise regularly.  Go to the bathroom when you have the urge to have a bowel movement. Do not wait.    Avoid straining to have bowel movements.  Keep the anal area dry and clean. Use wet toilet paper or moist towelettes after a bowel movement.  Do not sit on the toilet for long periods of time. This increases blood pooling and pain. Contact a health care provider if:  You have increasing pain and swelling that are not controlled by treatment or medicine.  You have uncontrolled bleeding.  You  have difficulty having a bowel movement, or you are unable to have a bowel movement.  You have pain or inflammation outside the area of the hemorrhoids. This information is not intended to replace advice given to you by your health care provider. Make sure you discuss any questions you have with your health care provider. Document Released: 05/30/2000 Document Revised: 10/31/2015 Document Reviewed: 02/14/2015 Elsevier Interactive Patient Education  2018 [employer]  

## 2017-12-10 NOTE — Assessment & Plan Note (Signed)
Pt cleared for excisional hemorrhoidectomy

## 2017-12-11 LAB — CBC WITH DIFFERENTIAL/PLATELET
Basophils Absolute: 0.1 10*3/uL (ref 0.0–0.1)
Basophils Relative: 1.1 % (ref 0.0–3.0)
Eosinophils Absolute: 0.2 10*3/uL (ref 0.0–0.7)
Eosinophils Relative: 2 % (ref 0.0–5.0)
HCT: 41.9 % (ref 39.0–52.0)
Hemoglobin: 14.1 g/dL (ref 13.0–17.0)
Lymphocytes Relative: 19.6 % (ref 12.0–46.0)
Lymphs Abs: 1.9 10*3/uL (ref 0.7–4.0)
MCHC: 33.6 g/dL (ref 30.0–36.0)
MCV: 87.4 fl (ref 78.0–100.0)
Monocytes Absolute: 0.6 10*3/uL (ref 0.1–1.0)
Monocytes Relative: 6.8 % (ref 3.0–12.0)
Neutro Abs: 6.7 10*3/uL (ref 1.4–7.7)
Neutrophils Relative %: 70.5 % (ref 43.0–77.0)
Platelets: 350 10*3/uL (ref 150.0–400.0)
RBC: 4.8 Mil/uL (ref 4.22–5.81)
RDW: 13.7 % (ref 11.5–15.5)
WBC: 9.5 10*3/uL (ref 4.0–10.5)

## 2017-12-11 LAB — COMPREHENSIVE METABOLIC PANEL
ALT: 17 U/L (ref 0–53)
AST: 15 U/L (ref 0–37)
Albumin: 4.5 g/dL (ref 3.5–5.2)
Alkaline Phosphatase: 85 U/L (ref 39–117)
BUN: 12 mg/dL (ref 6–23)
CO2: 31 mEq/L (ref 19–32)
Calcium: 10 mg/dL (ref 8.4–10.5)
Chloride: 104 mEq/L (ref 96–112)
Creatinine, Ser: 1.27 mg/dL (ref 0.40–1.50)
GFR: 62.06 mL/min (ref >60.00)
Glucose, Bld: 142 mg/dL — ABNORMAL HIGH (ref 70–99)
Potassium: 4.7 mEq/L (ref 3.5–5.1)
Sodium: 143 mEq/L (ref 135–145)
Total Bilirubin: 0.4 mg/dL (ref 0.2–1.2)
Total Protein: 7 g/dL (ref 6.0–8.3)

## 2017-12-11 LAB — LIPID PANEL
Cholesterol: 158 mg/dL (ref 0–200)
HDL: 30 mg/dL — ABNORMAL LOW (ref >39.00)
NonHDL: 127.67
Total CHOL/HDL Ratio: 5
Triglycerides: 260 mg/dL — ABNORMAL HIGH (ref 0.0–149.0)
VLDL: 52 mg/dL — ABNORMAL HIGH (ref 0.0–40.0)

## 2017-12-11 LAB — SEDIMENTATION RATE: Sed Rate: 5 mm/hr (ref 0–20)

## 2017-12-11 LAB — LDL CHOLESTEROL, DIRECT: Direct LDL: 80 mg/dL

## 2017-12-14 ENCOUNTER — Telehealth: Payer: Self-pay | Admitting: *Deleted

## 2017-12-14 ENCOUNTER — Other Ambulatory Visit: Payer: Self-pay | Admitting: *Deleted

## 2017-12-14 ENCOUNTER — Encounter: Payer: Self-pay | Admitting: *Deleted

## 2017-12-14 DIAGNOSIS — E785 Hyperlipidemia, unspecified: Secondary | ICD-10-CM

## 2017-12-14 DIAGNOSIS — I10 Essential (primary) hypertension: Secondary | ICD-10-CM

## 2017-12-14 LAB — VITAMIN D 1,25 DIHYDROXY
Vitamin D 1, 25 (OH)2 Total: 48 pg/mL (ref 18–72)
Vitamin D2 1, 25 (OH)2: 18 pg/mL
Vitamin D3 1, 25 (OH)2: 30 pg/mL

## 2017-12-14 LAB — ANA: Anti Nuclear Antibody(ANA): NEGATIVE

## 2017-12-14 LAB — RHEUMATOID FACTOR: Rhuematoid fact SerPl-aCnc: <14 IU/mL (ref <14)

## 2017-12-14 MED ORDER — FENOFIBRATE 160 MG PO TABS
160.0000 mg | ORAL_TABLET | Freq: Every day | ORAL | 2 refills | Status: DC
Start: 2017-12-14 — End: 2017-12-15

## 2017-12-14 NOTE — Telephone Encounter (Signed)
Ov notes faxed to surgeon

## 2017-12-14 NOTE — Telephone Encounter (Signed)
-----   Message from Ann Held, DO sent at 12/10/2017  8:34 PM EDT ----- Ov needs to be faxed to surgeon

## 2017-12-15 ENCOUNTER — Telehealth: Payer: Self-pay

## 2017-12-15 MED ORDER — FENOFIBRATE 145 MG PO TABS: 145.0000 mg | ORAL_TABLET | Freq: Every day | ORAL | 2 refills | Status: DC

## 2017-12-15 NOTE — Telephone Encounter (Signed)
Patient was not fasting do you want him to start medication?

## 2017-12-15 NOTE — Telephone Encounter (Signed)
Switch to Fenofibrate 145 mg tab, 1 tab po daily

## 2017-12-15 NOTE — Telephone Encounter (Signed)
Rx switched and [facility].

## 2017-12-15 NOTE — Telephone Encounter (Signed)
Received PA request for fenofibrate 160mg  from [facility]. Pt has not tried and failed alternatives- Gemfibrozil and/or fenofibrate 134mg  or fenofibrate 145mg  that is available without PA. PA for fenofibrate 160mg  cancelled.

## 2017-12-16 ENCOUNTER — Other Ambulatory Visit: Payer: Self-pay | Admitting: Family Medicine

## 2017-12-16 DIAGNOSIS — K219 Gastro-esophageal reflux disease without esophagitis: Secondary | ICD-10-CM

## 2017-12-16 DIAGNOSIS — M25511 Pain in right shoulder: Secondary | ICD-10-CM

## 2017-12-18 ENCOUNTER — Other Ambulatory Visit: Payer: Self-pay | Admitting: Endocrinology

## 2017-12-18 ENCOUNTER — Encounter: Payer: Self-pay | Admitting: Family Medicine

## 2017-12-18 ENCOUNTER — Other Ambulatory Visit: Payer: Self-pay | Admitting: Family Medicine

## 2017-12-21 ENCOUNTER — Encounter: Payer: Self-pay | Admitting: Internal Medicine

## 2017-12-21 ENCOUNTER — Telehealth: Payer: Self-pay | Admitting: Internal Medicine

## 2017-12-21 ENCOUNTER — Ambulatory Visit (INDEPENDENT_AMBULATORY_CARE_PROVIDER_SITE_OTHER): Payer: BLUE CROSS/BLUE SHIELD | Admitting: Internal Medicine

## 2017-12-21 VITALS — BP 110/70 | HR 91 | Ht 71.0 in | Wt 188.8 lb

## 2017-12-21 DIAGNOSIS — E1151 Type 2 diabetes mellitus with diabetic peripheral angiopathy without gangrene: Secondary | ICD-10-CM

## 2017-12-21 DIAGNOSIS — E785 Hyperlipidemia, unspecified: Secondary | ICD-10-CM

## 2017-12-21 DIAGNOSIS — E1165 Type 2 diabetes mellitus with hyperglycemia: Secondary | ICD-10-CM | POA: Diagnosis not present

## 2017-12-21 DIAGNOSIS — IMO0002 Reserved for concepts with insufficient information to code with codable children: Secondary | ICD-10-CM

## 2017-12-21 LAB — POCT GLYCOSYLATED HEMOGLOBIN (HGB A1C): Hemoglobin A1C: 7.5 % — AB (ref 4.0–5.6)

## 2017-12-21 MED ORDER — FREESTYLE LIBRE 14 DAY SENSOR MISC: 1.0000 | 3 refills | Status: DC

## 2017-12-21 NOTE — Patient Instructions (Addendum)
Please continue: - Jardiance 25 mg in am - Januvia 100 mg in am  Check sugars once a day, rotating check times.  Please schedule an appt with Antonieta Iba with nutrition.  Stop Juice and regular sodas!  Please come back for a follow-up appointment in 3 months.

## 2017-12-21 NOTE — Progress Notes (Signed)
Patient ID: Chase [name], male   DOB: May 06, 1961, 57 y.o.   MRN: [id number]  HPI: Chase [name] is a 57 y.o.-year-old male, returning for follow-up for DM2, dx in 2014, non-insulin-dependent, uncontrolled, with complications (PVD, cerebro-vascular ds - s/p "mini"strokes, PN). Last visit 3 months ago.  He will have hemorrhoid surgery at the end of this week.  Last hemoglobin A1c was: Lab Results  Component Value Date   HGBA1C 7.4 09/17/2017   HGBA1C 7.8 06/19/2017   HGBA1C 7.1 (H) 02/19/2017   Pt is on a regimen of: - Jardiance 25 mg daily in am - started 04/2016 - Januvia 100 mg daily in am - started 10/2015 Stopped Glipizide b/c rash. Stopped Invokana 100 mg in am >> only got this for 1 mo He was on Metformin 500 mg po bid - in liquid form, as he could not swallow pills. He gets gi upset. He stopped this in 2014.  Pt was checking his sugars frequently with his Freestyle libre CGM >> now off, as not covered.  He was not checking sugars at last visit, now checking 0-1x a day: - brunch:  152-194 >> 130-150 >> 153, 228 - 2h after brunch:  312 >> 150-220 >> n/c - before dinner: 216 >> 150-200 >> n/c - 2h after dinner; 191-273 >> 180-210 >> 260 - bedtime: 172-214 >> 208 >> see above - nighttime: n/c Lowest sugar was 120 >> 153,  he has hypoglycemia awareness at 70s. Highest sugar was 300s (at night) >> 260.  Glucometer: [employer] Next  He drinks lemonade and ginger ale.  -  + MildCKD, last BUN/creatinine:  Lab Results  Component Value Date   BUN 12 12/10/2017   BUN 17 08/04/2017   CREATININE 1.27 12/10/2017   CREATININE 1.09 08/04/2017   - + HL.  last set of lipids: Lab Results  Component Value Date   CHOL 158 12/10/2017   HDL 30.00 (L) 12/10/2017   LDLCALC 102 (H) 08/04/2017   LDLDIRECT 80.0 12/10/2017   TRIG 260.0 (H) 12/10/2017   CHOLHDL 5 12/10/2017  On pravastatin 20. 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 He has anxiety, PTSD from childhood. Has varicose veins >> wears compresion hoses. He also has a h/o kidney stones.  ROS: Constitutional: no weight gain/no weight loss, + fatigue, + heat and cold intolerance, + hot flashes, + nocturia, + dysuria Eyes: + Blurry vision blurry vision, no xerophthalmia ENT: no sore throat, no nodules palpated in throat, no dysphagia, no odynophagia, no hoarseness Cardiovascular: + All: Chest pain, palpitations, shortness of breath, leg swelling Respiratory: no cough/+SOB/no wheezing Gastrointestinal: no N/no V/no D/no C/+ acid reflux Musculoskeletal: + Muscle aches/+ joint aches Skin: no rashes, no hair loss Neurological: no tremors/no numbness/no tingling/no dizziness, + headache  I reviewed pt's medications, allergies, PMH, social hx, family hx, and changes were documented in the history of present illness. Otherwise, unchanged from my initial visit note.  Past Medical History:  Diagnosis Date  . Allergy   . Arthritis   . Blood in stool   . Brain cyst   . Chest pain   . Constipation   . Diabetes mellitus without complication ([place])   . Difficulty urinating   . GERD (gastroesophageal reflux disease)   . Headache(784.0)   . Hearing  loss   . Hyperlipidemia   . Hypertension   . Leg swelling   . Nasal congestion   . PONV (postoperative nausea and vomiting)   . Rectal pain   . Rectal ulcer with bleeding after hemorrhoid banding 02/11/2015  . Sleep apnea   . Stroke ([place])   . Stroke ([place])   . Thrombosed hemorrhoids   . Trouble swallowing    Past Surgical History:  Procedure Laterality Date  . CATARACT EXTRACTION     x 3  . [place]   cataracts  . FLEXIBLE SIGMOIDOSCOPY N/A 02/11/2015   Procedure: FLEXIBLE SIGMOIDOSCOPY;  Surgeon: Gatha Mayer, MD;  Location: [facility];  Service: Endoscopy;   Laterality: N/A;  . INNER EAR SURGERY     rt ear  . INNER EAR SURGERY  1992  . surgery - right arm     1983   Social History   Socioeconomic History  . Marital status: Single    Spouse name: Not on file  . Number of children: 0  . Years of education: Not on file  . Highest education level: Not on file  Occupational History  . Not on file  Social Needs  . Financial resource strain: Not on file  . Food insecurity:    Worry: Not on file    Inability: Not on file  . Transportation needs:    Medical: Not on file    Non-medical: Not on file  Tobacco Use  . Smoking status: Never Smoker  . Smokeless tobacco: Never Used  Substance and Sexual Activity  . Alcohol use: No    Alcohol/week: 0.0 oz    Comment: none  . Drug use: No  . Sexual activity: Never  Lifestyle  . Physical activity:    Days per week: Not on file    Minutes per session: Not on file  . Stress: Not on file  Relationships  . Social connections:    Talks on phone: Not on file    Gets together: Not on file    Attends religious service: Not on file    Active member of club or organization: Not on file    Attends meetings of clubs or organizations: Not on file    Relationship status: Not on file  . Intimate partner violence:    Fear of current or ex partner: Not on file    Emotionally abused: Not on file    Physically abused: Not on file    Forced sexual activity: Not on file  Other Topics Concern  . Not on file  Social History Narrative   No caffeine intake except for chocolate.  Exercised-walking   Current Outpatient Medications on File Prior to Visit  Medication Sig Dispense Refill  . allopurinol (ZYLOPRIM) 100 MG tablet TAKE 1 TABLET BY MOUTH EVERY DAY 90 tablet 1  . amLODipine (NORVASC) 5 MG tablet TAKE 1 TABLET BY MOUTH EVERY DAY 30 tablet 2  . Azelastine HCl 0.15 % SOLN PLACE 2 SPRAYS IN EACH NOSTRIL every night 30 mL 11  . BAYER MICROLET LANCETS lancets Use 1x a day 100 each 4  . budesonide  (RHINOCORT AQUA) 32 MCG/ACT nasal spray Place 1 spray into both nostrils daily. 1 Bottle 5  . [facility] ASPIRIN 81 MG chewable tablet CHEW 1 TABLET BY MOUTH DAILY. (Patient not taking: Reported on 12/10/2017) 90 tablet 3  . cyclobenzaprine (FLEXERIL) 10 MG tablet TAKE 1 TABLET BY MOUTH AT BEDTIME AS NEEDED FOR MUSCLE  SPASMS. 30 tablet 0  . diphenhydrAMINE (BENADRYL) 25 MG tablet     . doxycycline (VIBRA-TABS) 100 MG tablet Take 1 tablet (100 mg total) by mouth 2 (two) times daily. 20 tablet 0  . empagliflozin (JARDIANCE) 25 MG TABS tablet Take 25 mg by mouth daily. 90 tablet 3  . fenofibrate (TRICOR) 145 MG tablet Take 1 tablet (145 mg total) by mouth daily. 30 tablet 2  . FIBER SELECT GUMMIES PO Take 1 tablet by mouth daily.    Marland Kitchen gabapentin (NEURONTIN) 100 MG capsule Take 1 capsule (100 mg total) by mouth 3 (three) times daily. 90 capsule 3  . gentamicin cream (GARAMYCIN) 0.1 % Apply 1 application topically 3 (three) times daily. 30 g 1  . glucose blood test strip Use 1x a day - E11.51, E11.65 100 each 3  . GuaiFENesin 200 MG/10ML SOLN     . HYDROcodone-acetaminophen (HYCET) 7.5-325 mg/15 ml solution TAKE 20 ML EVERY 6 HOURS AS NEEDED FOR PAIN 473 mL 0  . hydrOXYzine (ATARAX/VISTARIL) 25 MG tablet TAKE 1 TABLET BY MOUTH EVERY 8 HOURS AS NEEDED FOR ANXIETY OR ITCHING. 90 tablet 0  . ibuprofen (ADVIL) 200 MG tablet     . loratadine (CLARITIN) 10 MG tablet Take 10 mg by mouth daily.    Marland Kitchen loratadine (CLARITIN) 10 MG tablet Take 1 tablet (10 mg total) by mouth daily. 30 tablet 11  . metoprolol tartrate (LOPRESSOR) 25 MG tablet TAKE 1 TABLET BY MOUTH TWICE A DAY -MAY CRUSH IN FOOD 60 tablet 4  . mometasone (ELOCON) 0.1 % ointment APPLY TO AFFECTED AREA EVERY DAY AS NEEDED FOR RASH 45 g 0  . Multiple Vitamins-Minerals (AIRBORNE GUMMIES PO) Take 1 each by mouth daily.    . ondansetron (ZOFRAN-ODT) 4 MG disintegrating tablet USE UP TO THREE TIMES A DAY AS NEEDED FOR NAUSEA. 10 tablet 0  .  polyethylene glycol powder (GLYCOLAX/MIRALAX) powder TAKE 17 GRAMS BY MOUTH 2 TIMES DAILY AS NEEDED. 527 g 5  . pravastatin (PRAVACHOL) 20 MG tablet Take 1 tablet (20 mg total) by mouth daily. 90 tablet 0  . PROCTOFOAM HC rectal foam APPLY FOAM THREE TIMES A DAY AS NEEDED FOR 7 DAYS 10 g 10  . ranitidine (ZANTAC) 75 MG/5ML syrup TAKE 10 MLS BY MOUTH 2 TIMES DAILY AS NEEDED. FOR INDIGESTION. 300 mL 1  . sitaGLIPtin (JANUVIA) 100 MG tablet Take 1 tablet (100 mg total) by mouth daily. 90 tablet 0  . Triamcinolone Acetonide (NASACORT ALLERGY 24HR NA) Place into the nose.    . venlafaxine XR (EFFEXOR XR) 75 MG 24 hr capsule Take 1 capsule (75 mg total) by mouth daily with breakfast. 90 capsule 1   No current facility-administered medications on file prior to visit.    Allergies  Allergen Reactions  . Amoxicillin Hives, Itching and Other (See Comments)    White tongue; ? hives  . Fluticasone     Other reaction(s): Other Sores inside nose  . Sulfa Antibiotics Rash  . Flonase [Fluticasone Propionate]     Nasal Sores  . Terfenadine     ? reaction   Family History  Problem Relation Age of Onset  . Breast cancer Mother 76       breast  . Huntington's disease Mother   . Stroke Father   . Heart disease Father   . Hypertension Father   . Heart attack Father   . Hypertension Brother   . Hyperlipidemia Neg Hx   . Diabetes Neg Hx  PE: There were no vitals taken for this visit. Wt Readings from Last 3 Encounters:  12/10/17 188 lb 12.8 oz (85.6 kg)  09/17/17 187 lb (84.8 kg)  08/04/17 185 lb 3.2 oz (84 kg)   Constitutional: overweight, in NAD Eyes: Right surgical pupil, EOMI, no exophthalmos ENT: moist mucous membranes, no thyromegaly, no cervical lymphadenopathy Cardiovascular: RRR, No MRG Respiratory: CTA B Gastrointestinal: abdomen soft, NT, ND, BS+ Musculoskeletal: no deformities, strength intact in all 4 Skin: moist, warm, no rashes Neurological: no tremor with outstretched  hands, DTR normal in all 4  ASSESSMENT: 1. DM2, non-insulin-dependent, uncontrolled, with complications - PVD - cerebro-vascular ds - s/p "mini"strokes - PN  2. HL  PLAN:  1. Patient with history of uncontrolled type 2 diabetes, previously doing well on Januvia and Jardiance, then with deteriorating control after the insurance did not cover one or the other of the 2.  After multiple attempts to have them covered in multiple preauthorization forms, he finally obtained both of them, with Januvia started back in 08/2017.  His insurance is now denying him the Freestyle libre CGM.  He had one in the past and the traces showed higher sugars after meals and also sugars above goal in the morning. - We discussed at last visit that if 1 of the other medications are not covered again, we will need to start Actos.  I do not see any other option due to cost and previous intolerance to metformin. - At last visit, HbA1c was improved, to 7.4%.  We continue the same regimen but I strongly advised him to start checking sugars with a glucometer.  He started to check since last visit, but only has 3 checks in last 3 months.  They are high. - We discussed about the need to improve his diet.  Upon questioning, he is drinking sweet drinks and I strongly advised him to cut down on these and to ideally stop.  We discussed about staying with water, mineral water, and some almond milk.  I also advised him against artificial sweeteners.  I referred him to nutrition, also. - We discussed about how to manage his regimen at the time of his surgery at the end of the week.  He will be off Jardiance and Januvia for the day of the surgery but he can take them if he eats after the surgery - I suggested to:  Patient Instructions  Please continue: - Jardiance 25 mg in am - Januvia 100 mg in am  Check sugars once a day, rotating check times.  Please schedule an appt with Antonieta Iba with nutrition.  Stop Juice and regular  sodas!  Please come back for a follow-up appointment in 3 months.  - today, HbA1c is 7.5% (a little higher) - continue checking sugars at different times of the day - check 1x a day, rotating checks - advised for yearly eye exams >> he is not UTD - Return to clinic in 3 mo with sugar log      2. HL - Reviewed latest lipid panel from 11/2017: LDL improved, now lower than 100, HDL decreased, triglycerides again high - Continues  Lipitor without side effects.  Philemon Kingdom, MD PhD [facility] Endocrinology

## 2017-12-21 NOTE — Telephone Encounter (Signed)
done

## 2017-12-21 NOTE — Telephone Encounter (Signed)
Patient needs RX for [place] 14 day sensors [facility] [address]

## 2017-12-21 NOTE — Telephone Encounter (Signed)
Is there a dose that is a capsule

## 2017-12-21 NOTE — Addendum Note (Signed)
Addended by: Drucilla Schmidt on: 12/21/2017 04:45 PM   Modules accepted: Orders

## 2017-12-22 ENCOUNTER — Other Ambulatory Visit: Payer: Self-pay

## 2017-12-22 ENCOUNTER — Other Ambulatory Visit: Payer: Self-pay | Admitting: Family Medicine

## 2017-12-22 DIAGNOSIS — I10 Essential (primary) hypertension: Secondary | ICD-10-CM

## 2017-12-22 MED ORDER — GLUCOSE BLOOD VI STRP: ORAL_STRIP | 3 refills | Status: DC

## 2017-12-22 MED ORDER — FENOFIBRATE 134 MG PO CAPS: 134.0000 mg | ORAL_CAPSULE | Freq: Every day | ORAL | 1 refills | Status: DC

## 2017-12-22 NOTE — Telephone Encounter (Signed)
Called pharmacy dosages that comes in capsules are 130mg , 134mg , 200mg 

## 2017-12-22 NOTE — Telephone Encounter (Signed)
Change to 134 mg  #90  1 refill

## 2017-12-25 DIAGNOSIS — Z7951 Long term (current) use of inhaled steroids: Secondary | ICD-10-CM | POA: Diagnosis not present

## 2017-12-25 DIAGNOSIS — Z9989 Dependence on other enabling machines and devices: Secondary | ICD-10-CM | POA: Diagnosis not present

## 2017-12-25 DIAGNOSIS — E119 Type 2 diabetes mellitus without complications: Secondary | ICD-10-CM | POA: Diagnosis not present

## 2017-12-25 DIAGNOSIS — Z79899 Other long term (current) drug therapy: Secondary | ICD-10-CM | POA: Diagnosis not present

## 2017-12-25 DIAGNOSIS — K219 Gastro-esophageal reflux disease without esophagitis: Secondary | ICD-10-CM | POA: Diagnosis not present

## 2017-12-25 DIAGNOSIS — K648 Other hemorrhoids: Secondary | ICD-10-CM | POA: Diagnosis not present

## 2017-12-25 DIAGNOSIS — Z881 Allergy status to other antibiotic agents status: Secondary | ICD-10-CM | POA: Diagnosis not present

## 2017-12-25 DIAGNOSIS — E785 Hyperlipidemia, unspecified: Secondary | ICD-10-CM | POA: Diagnosis not present

## 2017-12-25 DIAGNOSIS — F419 Anxiety disorder, unspecified: Secondary | ICD-10-CM | POA: Diagnosis not present

## 2017-12-25 DIAGNOSIS — Z888 Allergy status to other drugs, medicaments and biological substances status: Secondary | ICD-10-CM | POA: Diagnosis not present

## 2017-12-25 DIAGNOSIS — I10 Essential (primary) hypertension: Secondary | ICD-10-CM | POA: Diagnosis not present

## 2017-12-25 DIAGNOSIS — J449 Chronic obstructive pulmonary disease, unspecified: Secondary | ICD-10-CM | POA: Diagnosis not present

## 2017-12-25 DIAGNOSIS — I69320 Aphasia following cerebral infarction: Secondary | ICD-10-CM | POA: Diagnosis not present

## 2017-12-25 DIAGNOSIS — G4730 Sleep apnea, unspecified: Secondary | ICD-10-CM | POA: Diagnosis not present

## 2017-12-25 DIAGNOSIS — Z882 Allergy status to sulfonamides status: Secondary | ICD-10-CM | POA: Diagnosis not present

## 2017-12-25 DIAGNOSIS — M797 Fibromyalgia: Secondary | ICD-10-CM | POA: Diagnosis not present

## 2017-12-25 DIAGNOSIS — F329 Major depressive disorder, single episode, unspecified: Secondary | ICD-10-CM | POA: Diagnosis not present

## 2017-12-25 DIAGNOSIS — K644 Residual hemorrhoidal skin tags: Secondary | ICD-10-CM | POA: Diagnosis not present

## 2018-01-06 DIAGNOSIS — N138 Other obstructive and reflux uropathy: Secondary | ICD-10-CM | POA: Diagnosis not present

## 2018-01-06 DIAGNOSIS — N401 Enlarged prostate with lower urinary tract symptoms: Secondary | ICD-10-CM | POA: Diagnosis not present

## 2018-01-08 ENCOUNTER — Other Ambulatory Visit: Payer: Self-pay | Admitting: Family Medicine

## 2018-01-08 DIAGNOSIS — M25511 Pain in right shoulder: Secondary | ICD-10-CM

## 2018-01-13 DIAGNOSIS — N401 Enlarged prostate with lower urinary tract symptoms: Secondary | ICD-10-CM | POA: Diagnosis not present

## 2018-01-13 DIAGNOSIS — N138 Other obstructive and reflux uropathy: Secondary | ICD-10-CM | POA: Diagnosis not present

## 2018-01-13 DIAGNOSIS — R8290 Unspecified abnormal findings in urine: Secondary | ICD-10-CM | POA: Diagnosis not present

## 2018-01-13 DIAGNOSIS — R82998 Other abnormal findings in urine: Secondary | ICD-10-CM | POA: Diagnosis not present

## 2018-01-18 DIAGNOSIS — G4733 Obstructive sleep apnea (adult) (pediatric): Secondary | ICD-10-CM | POA: Diagnosis not present

## 2018-01-19 ENCOUNTER — Other Ambulatory Visit: Payer: Self-pay | Admitting: Family Medicine

## 2018-01-19 DIAGNOSIS — K219 Gastro-esophageal reflux disease without esophagitis: Secondary | ICD-10-CM

## 2018-01-22 ENCOUNTER — Ambulatory Visit: Payer: BLUE CROSS/BLUE SHIELD | Admitting: Dietician

## 2018-02-02 ENCOUNTER — Encounter: Payer: Self-pay | Admitting: Family Medicine

## 2018-02-02 ENCOUNTER — Other Ambulatory Visit: Payer: Self-pay | Admitting: Family Medicine

## 2018-02-02 DIAGNOSIS — E785 Hyperlipidemia, unspecified: Secondary | ICD-10-CM

## 2018-02-02 DIAGNOSIS — F322 Major depressive disorder, single episode, severe without psychotic features: Secondary | ICD-10-CM

## 2018-02-02 DIAGNOSIS — I10 Essential (primary) hypertension: Secondary | ICD-10-CM

## 2018-02-05 ENCOUNTER — Other Ambulatory Visit: Payer: Self-pay | Admitting: Family Medicine

## 2018-02-05 DIAGNOSIS — F322 Major depressive disorder, single episode, severe without psychotic features: Secondary | ICD-10-CM

## 2018-02-08 ENCOUNTER — Other Ambulatory Visit: Payer: Self-pay | Admitting: Family Medicine

## 2018-02-08 DIAGNOSIS — M25511 Pain in right shoulder: Secondary | ICD-10-CM

## 2018-02-10 ENCOUNTER — Encounter: Payer: Self-pay | Admitting: Neurology

## 2018-02-11 ENCOUNTER — Encounter: Payer: Self-pay | Admitting: Neurology

## 2018-02-11 ENCOUNTER — Ambulatory Visit (INDEPENDENT_AMBULATORY_CARE_PROVIDER_SITE_OTHER): Payer: BLUE CROSS/BLUE SHIELD | Admitting: Neurology

## 2018-02-11 VITALS — BP 113/72 | HR 86 | Ht 70.0 in | Wt 195.0 lb

## 2018-02-11 DIAGNOSIS — I4891 Unspecified atrial fibrillation: Secondary | ICD-10-CM | POA: Diagnosis not present

## 2018-02-11 DIAGNOSIS — R351 Nocturia: Secondary | ICD-10-CM | POA: Insufficient documentation

## 2018-02-11 DIAGNOSIS — G4731 Primary central sleep apnea: Secondary | ICD-10-CM | POA: Diagnosis not present

## 2018-02-11 DIAGNOSIS — G4739 Other sleep apnea: Secondary | ICD-10-CM

## 2018-02-11 DIAGNOSIS — F5104 Psychophysiologic insomnia: Secondary | ICD-10-CM | POA: Diagnosis not present

## 2018-02-11 DIAGNOSIS — G4737 Central sleep apnea in conditions classified elsewhere: Secondary | ICD-10-CM

## 2018-02-11 DIAGNOSIS — G4719 Other hypersomnia: Secondary | ICD-10-CM

## 2018-02-11 DIAGNOSIS — G4730 Sleep apnea, unspecified: Secondary | ICD-10-CM

## 2018-02-11 HISTORY — DX: Sleep apnea, unspecified: G47.30

## 2018-02-11 HISTORY — DX: Primary central sleep apnea: G47.31

## 2018-02-11 HISTORY — DX: Nocturia: R35.1

## 2018-02-11 HISTORY — DX: Other sleep apnea: G47.39

## 2018-02-11 HISTORY — DX: Other hypersomnia: G47.19

## 2018-02-11 NOTE — Progress Notes (Signed)
SLEEP MEDICINE CLINIC   Provider:  Larey Seat, M D  Primary Care Physician:  Carollee Herter, Alferd Apa, DO   Referring Provider: Carollee Herter, Alferd Apa, *    Chief Complaint  Patient presents with  . New Patient (Initial Visit)    pt alone, rm 11. pt states that he is having increase in daytime sleepiness, memory difficulties and headaches. current CPAP user, this past June have been 5 years with current machine. Last sleep study completed here on 11/17/2012.     HPI:  Chase [name] is a 57 y.o. male , seen here as in a re-referral  from Dr. Carollee Herter for further evaluation of CPAP needs, in the setting of cognitive challenges.   Chief complaint according to patient : Chase [name] states that he is having increase in daytime sleepiness, memory difficulties and headaches. He is a current CPAP user, this past June has been 5 years with current CPAP machine. DM is worse, aphasia increased, headaches are bad and back. Last sleep study completed here on 11/17/2012.  I have the pleasure of meeting today with Chase [name], and meanwhile 57 year old Caucasian right-handed gentleman primary care patient of Dr. [name].  Over 5 years ago the patient was referred by Dr. Drusilla Kanner, at the time at the [facility] neurology.  He was 57 years old, had suffered several strokes, suffered from headaches, GERD, osteoarthritis, respiratory allergies, uncontrolled diabetes and hypertension hyperlipidemia, upper airway resistance the syndrome and mild obstructive sleep apnea had been already diagnosed in 2008, at the time by Dr. Danton Sewer.  He reported trouble sleeping through the night, excessive daytime sleepiness and pain at the jaw which made it difficult to sleep tolerate certain headgear's.  He was known to snore and he woke up this morning headaches.  His Epworth Sleepiness Scale had been endorsed at 17 points his AHI was 74.9, REM sleep had not been observed.  The study was split and CPAP titrated to  7 cmH2O REM sleep was still not seen, only 10 central apneas remained all obstructive apneas have been covered.  7 cm water pressure seemed to be partially effective.  The patient also brought to his compliance record here today, in the last 30 days he has used the machine 27 out of 30 days and 18 of those days over 4 hours, average use of time is just below 4 hours at 3 hours 50 minutes, CPAP is still set at 7 cm water pressure with 3 cm EPR, residual AHI is 4.7.  Central apneas occupied 3.8/h obstructive apneas 0.4/h there is actually no major air leak noted.  The current interface seems to work well for him.  He had a sudden rise in apnea since 30 January 2018- he is not sure if this would correlate with a new mask he just received ( August 9 th ).  Sleep habits are as follows: Dinner time is between 5- 9 PM, a wide range. Watches Tv after dinner, multiple hours. Goes to the bedroom between 11 Pm and 8 AM ( yes, 9 hours) and is most nights asleep promptly- he sleeps on his back and sides, one pillow. Bedroom is cool and quiet , not all dark.  He uses electronics in the bedroom , he has his smart phone and grabs it when he goes to the bathroom. Nocturia 1-3 times.  He sleeps a total of about 7 hours.  When he wakes up he reports headaches, of a quality that  he described as dull, throbbing and generalized- forehead, top of the head, an he feels sinus pressure, too.  He wakes up spontanously - cannot recall he last time he felt refreshed, restored.   Sleep medical history and family sleep history:  No family history of OSA, brother has HTN, thyroid disease. Mother had Huntington's disease- he and his brother have not had signs.    Social history:   Non smoker, but a lot of second hand exposure, ETOH none, caffeine - rare.  Disabled for over 5 years- since CVA. He lives with his best friend " Caren [name] ".     Review of Systems: Out of a complete 14 system review, the patient complains of only the  following symptoms, and all other reviewed systems are negative.  Snoring, EDS, depression, nocturia, poor sleep habits,   Epworth score  15/ 24  , Fatigue severity score 48  , depression score 10/ 15 major depression- on effexor.    Social History   Socioeconomic History  . Marital status: Single    Spouse name: Not on file  . Number of children: 0  . Years of education: Not on file  . Highest education level: Not on file  Occupational History  . Not on file  Social Needs  . Financial resource strain: Not on file  . Food insecurity:    Worry: Not on file    Inability: Not on file  . Transportation needs:    Medical: Not on file    Non-medical: Not on file  Tobacco Use  . Smoking status: Never Smoker  . Smokeless tobacco: Never Used  Substance and Sexual Activity  . Alcohol use: No    Alcohol/week: 0.0 standard drinks    Comment: none  . Drug use: No  . Sexual activity: Never  Lifestyle  . Physical activity:    Days per week: Not on file    Minutes per session: Not on file  . Stress: Not on file  Relationships  . Social connections:    Talks on phone: Not on file    Gets together: Not on file    Attends religious service: Not on file    Active member of club or organization: Not on file    Attends meetings of clubs or organizations: Not on file    Relationship status: Not on file  . Intimate partner violence:    Fear of current or ex partner: Not on file    Emotionally abused: Not on file    Physically abused: Not on file    Forced sexual activity: Not on file  Other Topics Concern  . Not on file  Social History Narrative   No caffeine intake except for chocolate.  Exercised-walking    Family History  Problem Relation Age of Onset  . Breast cancer Mother 23       breast  . Huntington's disease Mother   . Stroke Father   . Heart disease Father   . Hypertension Father   . Heart attack Father   . Hypertension Brother   . Hyperlipidemia Neg Hx   .  Diabetes Neg Hx     Past Medical History:  Diagnosis Date  . Allergy   . Arthritis   . Blood in stool   . Brain cyst   . Chest pain   . Constipation   . Diabetes mellitus without complication ([place])   . Difficulty urinating   . GERD (gastroesophageal reflux disease)   . Headache(784.0)   .  Hearing loss   . Hyperlipidemia   . Hypertension   . Leg swelling   . Nasal congestion   . PONV (postoperative nausea and vomiting)   . Rectal pain   . Rectal ulcer with bleeding after hemorrhoid banding 02/11/2015  . Sleep apnea   . Stroke ([place])   . Stroke ([place])   . Thrombosed hemorrhoids   . Trouble swallowing     Past Surgical History:  Procedure Laterality Date  . CATARACT EXTRACTION     x 3  . [place]   cataracts  . FLEXIBLE SIGMOIDOSCOPY N/A 02/11/2015   Procedure: FLEXIBLE SIGMOIDOSCOPY;  Surgeon: Gatha Mayer, MD;  Location: [facility] ENDOSCOPY;  Service: Endoscopy;  Laterality: N/A;  . INNER EAR SURGERY     rt ear  . INNER EAR SURGERY  1992  . surgery - right arm     1983    Current Outpatient Medications  Medication Sig Dispense Refill  . allopurinol (ZYLOPRIM) 100 MG tablet TAKE 1 TABLET BY MOUTH EVERY DAY 90 tablet 1  . amLODipine (NORVASC) 5 MG tablet TAKE 1 TABLET BY MOUTH EVERY DAY 30 tablet 2  . Azelastine HCl 0.15 % SOLN PLACE 2 SPRAYS IN EACH NOSTRIL every night 30 mL 11  . BAYER MICROLET LANCETS lancets Use 1x a day 100 each 4  . [employer] CHILDRENS ASPIRIN 81 MG chewable tablet CHEW 1 TABLET BY MOUTH DAILY. 90 tablet 3  . cyclobenzaprine (FLEXERIL) 10 MG tablet TAKE 1 TABLET BY MOUTH AT BEDTIME AS NEEDED FOR MUSCLE SPASMS. 30 tablet 0  . diphenhydrAMINE (BENADRYL) 25 MG tablet     . empagliflozin (JARDIANCE) 25 MG TABS tablet Take 25 mg by mouth daily. 90 tablet 3  . fenofibrate micronized (LOFIBRA) 134 MG capsule Take 1 capsule (134 mg total) by mouth daily before breakfast. 90 capsule 1  . FIBER SELECT GUMMIES PO Take 1 tablet by mouth daily.      Marland Kitchen gabapentin (NEURONTIN) 100 MG capsule Take 1 capsule (100 mg total) by mouth 3 (three) times daily. 90 capsule 3  . gentamicin cream (GARAMYCIN) 0.1 % Apply 1 application topically 3 (three) times daily. 30 g 1  . glucose blood test strip Use 1x a day - E11.51, E11.65 Dispense Contour Next Strips 100 each 3  . GuaiFENesin 200 MG/10ML SOLN     . HYDROcodone-acetaminophen (HYCET) 7.5-325 mg/15 ml solution TAKE 20 ML EVERY 6 HOURS AS NEEDED FOR PAIN 473 mL 0  . hydrOXYzine (ATARAX/VISTARIL) 25 MG tablet TAKE 1 TABLET BY MOUTH EVERY 8 HOURS AS NEEDED FOR ANXIETY OR ITCHING. 90 tablet 0  . ibuprofen (ADVIL) 200 MG tablet     . loratadine (CLARITIN) 10 MG tablet Take 10 mg by mouth daily.    . metoprolol tartrate (LOPRESSOR) 25 MG tablet TAKE 1 TABLET BY MOUTH TWICE A DAY -MAY CRUSH IN FOOD 180 tablet 0  . mometasone (ELOCON) 0.1 % ointment APPLY TO AFFECTED AREA EVERY DAY AS NEEDED FOR RASH 45 g 0  . Multiple Vitamins-Minerals (AIRBORNE GUMMIES PO) Take 1 each by mouth daily.    . polyethylene glycol powder (GLYCOLAX/MIRALAX) powder TAKE 17 GRAMS BY MOUTH 2 TIMES DAILY AS NEEDED. 527 g 5  . pravastatin (PRAVACHOL) 20 MG tablet TAKE 1 TABLET EVERY DAY 90 tablet 0  . PROCTOFOAM HC rectal foam APPLY FOAM THREE TIMES A DAY AS NEEDED FOR 7 DAYS 10 g 10  . ranitidine (ZANTAC) 75 MG/5ML syrup TAKE 10 MLS BY  MOUTH 2 TIMES DAILY AS NEEDED. FOR INDIGESTION. 300 mL 1  . sitaGLIPtin (JANUVIA) 100 MG tablet Take 1 tablet (100 mg total) by mouth daily. 90 tablet 0  . Triamcinolone Acetonide (NASACORT ALLERGY 24HR NA) Place into the nose.    . venlafaxine XR (EFFEXOR-XR) 75 MG 24 hr capsule TAKE 1 CAPSULE (75 MG TOTAL) BY MOUTH DAILY WITH BREAKFAST. 90 capsule 0  . ondansetron (ZOFRAN-ODT) 4 MG disintegrating tablet USE UP TO THREE TIMES A DAY AS NEEDED FOR NAUSEA. (Patient not taking: Reported on 02/11/2018) 10 tablet 0   No current facility-administered medications for this visit.     Allergies as of  02/11/2018 - Review Complete 02/11/2018  Allergen Reaction Noted  . Amoxicillin Hives, Itching, and Other (See Comments) 12/23/2006  . Fluticasone  05/02/2015  . Sulfa antibiotics Rash 01/30/2016  . Flonase [fluticasone propionate]  08/25/2014  . Terfenadine  12/09/2007    Vitals: BP 113/72   Pulse 86   Ht 5\' 10"  (1.778 m)   Wt 195 lb (88.5 kg)   BMI 27.98 kg/m  Last Weight:  Wt Readings from Last 1 Encounters:  02/11/18 195 lb (88.5 kg)   IDP:OEUM mass index is 27.98 kg/m.     Last Height:   Ht Readings from Last 1 Encounters:  02/11/18 5\' 10"  (1.778 m)    Physical exam:  General: The patient is awake, alert and appears not in acute distress. The patient is well groomed. Head: Normocephalic, atraumatic. Neck is supple. Mallampati 5- lost many teeth, poor dental status.  neck circumference:15.5 . Nasal airflow patent, TMJ click and pain  . Retrognathia is not seen.  Cardiovascular:  Regular rate and rhythm , without  murmurs or carotid bruit, and without distended neck veins. Respiratory: Lungs are clear to auscultation. Skin:  Without evidence of edema, or rash Trunk: BMI is 28. The patient's posture is stooped, he is frequently moving.    Neurologic exam : The patient is awake and alert, oriented to place and time.   MMSE:No flowsheet data found.  Attention span & concentration ability appears normal.  Speech; hoarseness with aphasia.  Mood and affect are depressed.   Cranial nerves: Pupils are equal and briskly reactive to light. Funduscopic exam deferred . Extraocular movements  in vertical and horizontal planes intact and without nystagmus. Visual fields by finger perimetry are intact. Hearing to finger rub intact.  Facial sensation intact to fine touch. Facial motor strength is symmetric and tongue in midline. No fasciculations. Shoulder shrug was symmetrical.   Motor exam: diffuse elevated tone, symmetric muscle bulk  in all extremities. He has restriction in ROM  of several joints, shoulder rotation, pronation and supination.  Sensory:  Fine touch, pinprick and vibration were tested in all extremities. Proprioception tested in the upper extremities was normal. Coordination: Rapid alternating movements in the fingers/hands was normal. Finger-to-nose maneuver  normal without evidence of ataxia, dysmetria or tremor. Gait and station: Patient walks without assistive device .Deep tendon reflexes: in the  upper and lower extremities are symmetric and intact.   Assessment:  After physical and neurologic examination, review of laboratory studies,  Personal review of imaging studies, reports of other /same  Imaging studies, results of polysomnography and / or neurophysiology testing and pre-existing records as far as provided in visit., my assessment is   1)  excessive daytime sleepiness -  I will need to re-evaluate the presence of apnea and treatment for this patient. His current AHI is 4.8 sub-optimal, but we  were not able to use ASV for this patient in 2013-14.   SPLIT at AHI 20-  Watch for hypoxemia, given his morning headaches and high fatigue. May need BiPAP instead of CPAP- may need o2.   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    The patient was advised of the nature of the diagnosed disorder , the treatment options and the  risks for general health and wellness arising from not treating the condition.   I spent more than 55 minutes of face to face time with the patient.  Greater than 50% of time was spent in counseling and coordination of care. We have discussed the diagnosis and differential and I answered the patient's questions.    Plan:  ATEENDED SPLIT STUDY< CO2, OXYGEN may be supplemented.    Larey Seat, MD 2/54/9826, 41:58 AM  Certified in Neurology by ABPN Certified in [place] by [facility] [address], [place],  [place] [address]

## 2018-02-11 NOTE — Patient Instructions (Signed)

## 2018-02-16 DIAGNOSIS — N411 Chronic prostatitis: Secondary | ICD-10-CM | POA: Diagnosis not present

## 2018-02-16 DIAGNOSIS — N401 Enlarged prostate with lower urinary tract symptoms: Secondary | ICD-10-CM | POA: Diagnosis not present

## 2018-02-16 DIAGNOSIS — N138 Other obstructive and reflux uropathy: Secondary | ICD-10-CM | POA: Diagnosis not present

## 2018-02-23 ENCOUNTER — Other Ambulatory Visit: Payer: Self-pay | Admitting: Family Medicine

## 2018-03-01 ENCOUNTER — Ambulatory Visit (INDEPENDENT_AMBULATORY_CARE_PROVIDER_SITE_OTHER): Payer: BLUE CROSS/BLUE SHIELD | Admitting: Neurology

## 2018-03-01 DIAGNOSIS — F5104 Psychophysiologic insomnia: Secondary | ICD-10-CM

## 2018-03-01 DIAGNOSIS — R351 Nocturia: Secondary | ICD-10-CM

## 2018-03-01 DIAGNOSIS — G4731 Primary central sleep apnea: Secondary | ICD-10-CM | POA: Diagnosis not present

## 2018-03-01 DIAGNOSIS — G4737 Central sleep apnea in conditions classified elsewhere: Secondary | ICD-10-CM

## 2018-03-01 DIAGNOSIS — G4730 Sleep apnea, unspecified: Secondary | ICD-10-CM

## 2018-03-01 DIAGNOSIS — G4719 Other hypersomnia: Secondary | ICD-10-CM

## 2018-03-01 DIAGNOSIS — I4891 Unspecified atrial fibrillation: Secondary | ICD-10-CM

## 2018-03-01 DIAGNOSIS — G4739 Other sleep apnea: Secondary | ICD-10-CM

## 2018-03-04 DIAGNOSIS — N138 Other obstructive and reflux uropathy: Secondary | ICD-10-CM | POA: Diagnosis not present

## 2018-03-04 DIAGNOSIS — N401 Enlarged prostate with lower urinary tract symptoms: Secondary | ICD-10-CM | POA: Diagnosis not present

## 2018-03-04 DIAGNOSIS — R39198 Other difficulties with micturition: Secondary | ICD-10-CM | POA: Diagnosis not present

## 2018-03-04 DIAGNOSIS — N411 Chronic prostatitis: Secondary | ICD-10-CM | POA: Diagnosis not present

## 2018-03-06 ENCOUNTER — Other Ambulatory Visit: Payer: Self-pay | Admitting: Family Medicine

## 2018-03-06 DIAGNOSIS — I10 Essential (primary) hypertension: Secondary | ICD-10-CM

## 2018-03-06 DIAGNOSIS — M25511 Pain in right shoulder: Secondary | ICD-10-CM

## 2018-03-08 ENCOUNTER — Other Ambulatory Visit: Payer: Self-pay | Admitting: Family Medicine

## 2018-03-08 ENCOUNTER — Encounter: Payer: Self-pay | Admitting: Family Medicine

## 2018-03-08 DIAGNOSIS — G8929 Other chronic pain: Secondary | ICD-10-CM

## 2018-03-08 DIAGNOSIS — M25511 Pain in right shoulder: Secondary | ICD-10-CM

## 2018-03-08 DIAGNOSIS — M2550 Pain in unspecified joint: Secondary | ICD-10-CM

## 2018-03-08 MED ORDER — HYDROCODONE-ACETAMINOPHEN 7.5-325 MG/15ML PO SOLN: ORAL | 0 refills | Status: DC

## 2018-03-08 NOTE — Telephone Encounter (Signed)
He probably needs ov

## 2018-03-12 ENCOUNTER — Ambulatory Visit (INDEPENDENT_AMBULATORY_CARE_PROVIDER_SITE_OTHER): Payer: BLUE CROSS/BLUE SHIELD | Admitting: Family Medicine

## 2018-03-12 ENCOUNTER — Encounter: Payer: Self-pay | Admitting: Family Medicine

## 2018-03-12 VITALS — BP 98/66 | HR 75 | Temp 98.1°F | Resp 16 | Ht 70.0 in | Wt 191.0 lb

## 2018-03-12 DIAGNOSIS — G43809 Other migraine, not intractable, without status migrainosus: Secondary | ICD-10-CM | POA: Diagnosis not present

## 2018-03-12 DIAGNOSIS — W57XXXD Bitten or stung by nonvenomous insect and other nonvenomous arthropods, subsequent encounter: Secondary | ICD-10-CM

## 2018-03-12 DIAGNOSIS — I4891 Unspecified atrial fibrillation: Secondary | ICD-10-CM

## 2018-03-12 DIAGNOSIS — G4737 Central sleep apnea in conditions classified elsewhere: Secondary | ICD-10-CM | POA: Insufficient documentation

## 2018-03-12 DIAGNOSIS — W57XXXA Bitten or stung by nonvenomous insect and other nonvenomous arthropods, initial encounter: Secondary | ICD-10-CM

## 2018-03-12 DIAGNOSIS — J0140 Acute pansinusitis, unspecified: Secondary | ICD-10-CM | POA: Diagnosis not present

## 2018-03-12 HISTORY — DX: Central sleep apnea in conditions classified elsewhere: G47.37

## 2018-03-12 HISTORY — DX: Unspecified atrial fibrillation: I48.91

## 2018-03-12 MED ORDER — TRIAMCINOLONE ACETONIDE 55 MCG/ACT NA AERO: 2.0000 | INHALATION_SPRAY | Freq: Every day | NASAL | 12 refills | Status: DC

## 2018-03-12 MED ORDER — NONFORMULARY OR COMPOUNDED ITEM: 1 refills | Status: DC

## 2018-03-12 MED ORDER — TRIAMCINOLONE ACETONIDE 0.1 % EX CREA: 1.0000 "application " | TOPICAL_CREAM | Freq: Two times a day (BID) | CUTANEOUS | 0 refills | Status: DC

## 2018-03-12 MED ORDER — AZITHROMYCIN 250 MG PO TABS: ORAL_TABLET | ORAL | 0 refills | Status: DC

## 2018-03-12 NOTE — Patient Instructions (Signed)

## 2018-03-12 NOTE — Procedures (Signed)
PATIENT'S NAME:  Chase [name], Chase [name] DOB:      May 13, 1961      MR#:    [id number]     DATE OF RECORDING: 03/01/2018 REFERRING M.D.:  [name] - [place], DO Study Performed:   Baseline Polysomnogram HISTORY:  Chase [name] is a 57 y.o. male patient seen here on 02-11-2018 in a re-referral from Dr. Carollee Herter for further evaluation of CPAP needs, in the setting of cognitive challenges.   Mr. Gelpi states that he is having increased daytime sleepiness, memory difficulties and headaches. He is a current CPAP user, this June he has been 5 years with current CPAP machine. Diabetes is poorly controlled, his Aphasia increased, his Headaches have returned. Last sleep study completed here on 11/17/2012.   Over 5 years ago the patient was referred by Dr. Drusilla Kanner, at the time at [facility] neurology.  He was 58 years old, had suffered several strokes, suffered from headaches, GERD, osteoarthritis, respiratory allergies, uncontrolled diabetes and hypertension hyperlipidemia, upper airway resistance the syndrome and mild obstructive sleep apnea had been already diagnosed with OSA in 2008, at the time by Dr. Danton Sewer.  He reported trouble sleeping through the night, excessive daytime sleepiness and pain at the jaw which made it difficult to sleep tolerate certain headgears.  He was known to snore and he woke up with headaches.  His Epworth Sleepiness Scale had been endorsed at 17 points; his AHI on 11-17-2012 was 74.9/h, REM sleep had not been observed. The study was split and CPAP titrated to 7 cmH2O when REM sleep was still not seen, only 10 central apneas remained, all obstructive apnea alleviated- partially effective.  The patient endorsed the Epworth Sleepiness Scale at 15/24 points, FSS at 48/63 points, depression score 10/15 points - very high.  The patient's weight 194 pounds with a height of 70 (inches), resulting in a BMI of 27.8 kg/m2.The patient's neck circumference measured 15.5 inches.  CURRENT  MEDICATIONS: Zyloprim, Norvasc, Aspirin, Flexeril, Jardiance, Neurontin, Garamycin, Hydrocodone, Vistaril, Advil, Claritin, Lopressor, Mometasone ointment, Pravachol, Zantac, Januvia, Effexor, Zofran  PROCEDURE:  This is a multichannel digital polysomnogram utilizing the Somnostar 11.2 system.  Electrodes and sensors were applied and monitored per AASM Specifications.   EEG, EOG, Chin and Limb EMG, were sampled at 200 Hz.  ECG, Snore and Nasal Pressure, Thermal Airflow, Respiratory Effort, CPAP Flow and Pressure, Oximetry was sampled at 50 Hz. Digital video and audio were recorded.      BASELINE STUDY: Lights Out was at 22:39 and Lights On at 05:00.  Total recording time (TRT) was 381.5 minutes, with a total sleep time (TST) of 146 minutes.  The patient's sleep latency was 126 minutes.  REM latency was 0 minutes.  The sleep efficiency was extremely poor at 38.3 %.     SLEEP ARCHITECTURE: WASO (Wake after sleep onset) was 131.5 minutes. There were 39.5 minutes in Stage N1, 29.5 minutes Stage N2, 77 minutes Stage N3 and 0 minutes in Stage REM.  The percentage of Stage N1 was 27.1%, Stage N2 was 20.2%, Stage N3 was 52.7%( due to slowed EEG in the setting of neurovascular disease or neurodegenerative disease )  and Stage R (REM sleep) was 0%.   RESPIRATORY ANALYSIS:  There were a total of 14 respiratory events:  0 obstructive apneas, 5 central apneas and 1 mixed apnea with 8 hypopneas. The patient also had 0 respiratory event related arousals (RERAs).      The total APNEA/HYPOPNEA INDEX (AHI) was 5.8 /hour  and the total RESPIRATORY DISTURBANCE INDEX was 5.8 /hour.  There was no REM sleep and all 22 events were in NREM. The patient spent 1 minute of total sleep time in the supine position and 145 minutes in non-supine. The supine AHI was 0.0 versus a non-supine AHI of 5.8.  OXYGEN SATURATION & C02:  The Wake baseline 02 saturation was 97%, with the lowest being 88%. Time spent below 89% saturation equaled 0  minutes. Average End [place] CO2 during sleep was 35.6 torr.     AROUSALS/ PERIODIC LIMB MOVEMENTS:  The patient had a total of 0 Periodic Limb Movements. The arousals were noted as: 33 were spontaneous, 0 were associated with PLMs, and 10 were associated with respiratory events.  EKG arrhythmia. PACs and prolonged QRS complexes were very frequent- See print out.  Audio and video analysis did not show any abnormal or unusual movements, behaviors, phonations or vocalizations. Moderately loud Snoring was noted. Mr. Jaso struggled with airway congestion, sniffling, and coughing- all of which delayed his sleep onset and reduced the recorded sleep time.  The patient took all his paperwork with him in AM, including the questionnaires. IMPRESSION:  1. Mild Central apnea and Insomnia. 2. Primary Snoring. 3. Abnormal EKG.  RECOMMENDATIONS:  1. Advise cardiac evaluation for arrhythmia, and pre medication for insomnia. 2. This apnea is too mild and not associated with hypoxemia to require PAP treatment. His residual AHI on CPAP was 4.8- not any improvement over baseline.   3. Neuropsychological evaluation for possible cerebrovascular disease versus neurodegenerative disease in the setting of cognitive decline. I send a copy to Dr. Antony Contras, MD.      I certify that I have reviewed the entire raw data recording prior to the issuance of this report in accordance with the Standards of Accreditation of the American Academy of Sleep Medicine (AASM)   Larey Seat, MD   03-11-2018  Diplomat, American Board of Psychiatry and Neurology  Diplomat, American Board of Sleep Medicine Medical Director, [employer] [employer]

## 2018-03-12 NOTE — Progress Notes (Signed)
Patient ID: Chase [name], male    DOB: 1961/04/20  Age: 57 y.o. MRN: [id number]    Subjective:  Subjective  HPI RAUL TORRANCE presents for sinus congestion , headache x2-3 weeks  Pt is taking otc tussin with some relief.  He is currently on cipro for a prostate infection   She is having neck pain and headache that are worsening  He was bit by insects and has some sores on his arms that he is concerned about.   Review of Systems  Constitutional: Negative for chills and fever.  HENT: Positive for congestion, rhinorrhea and sinus pressure. Negative for postnasal drip.   Respiratory: Positive for cough. Negative for chest tightness, shortness of breath and wheezing.   Cardiovascular: Negative for chest pain, palpitations and leg swelling.  Allergic/Immunologic: Negative for environmental allergies.  Neurological: Positive for headaches.    History Past Medical History:  Diagnosis Date  . Allergy   . Arthritis   . Blood in stool   . Brain cyst   . Chest pain   . Constipation   . Diabetes mellitus without complication ([place])   . Difficulty urinating   . GERD (gastroesophageal reflux disease)   . Headache(784.0)   . Hearing loss   . Hyperlipidemia   . Hypertension   . Leg swelling   . Nasal congestion   . PONV (postoperative nausea and vomiting)   . Rectal pain   . Rectal ulcer with bleeding after hemorrhoid banding 02/11/2015  . Sleep apnea   . Stroke ([place])   . Stroke ([place])   . Thrombosed hemorrhoids   . Trouble swallowing     He has a past surgical history that includes Cataract extraction; Inner ear surgery; Eye surgery ([place]); Inner ear surgery (1992); surgery - right arm; and Flexible sigmoidoscopy (N/A, 02/11/2015).   His family history includes Breast cancer (age of onset: 81) in his mother; Heart attack in his father; Heart disease in his father; Huntington's disease in his mother; Hypertension in his brother and father; Stroke in his father.He reports that  he has never smoked. He has never used smokeless tobacco. He reports that he does not drink alcohol or use drugs.  Current Outpatient Medications on File Prior to Visit  Medication Sig Dispense Refill  . allopurinol (ZYLOPRIM) 100 MG tablet TAKE 1 TABLET BY MOUTH EVERY DAY 90 tablet 1  . amLODipine (NORVASC) 5 MG tablet Take 1 tablet (5 mg total) by mouth daily. 30 tablet 5  . Azelastine HCl 0.15 % SOLN PLACE 2 SPRAYS IN EACH NOSTRIL every night 30 mL 11  . BAYER MICROLET LANCETS lancets Use 1x a day 100 each 4  . [employer] CHILDRENS ASPIRIN 81 MG chewable tablet CHEW 1 TABLET BY MOUTH DAILY. 90 tablet 3  . cyclobenzaprine (FLEXERIL) 10 MG tablet Take 1 tablet (10 mg total) by mouth at bedtime as needed for muscle spasms. 30 tablet 1  . diphenhydrAMINE (BENADRYL) 25 MG tablet     . empagliflozin (JARDIANCE) 25 MG TABS tablet Take 25 mg by mouth daily. 90 tablet 3  . FIBER SELECT GUMMIES PO Take 1 tablet by mouth daily.    Marland Kitchen gabapentin (NEURONTIN) 100 MG capsule Take 1 capsule (100 mg total) by mouth 3 (three) times daily. 90 capsule 3  . gentamicin cream (GARAMYCIN) 0.1 % Apply 1 application topically 3 (three) times daily. 30 g 1  . glucose blood test strip Use 1x a day - E11.51, E11.65 Dispense Contour Next Strips  100 each 3  . GuaiFENesin 200 MG/10ML SOLN     . HYDROcodone-acetaminophen (HYCET) 7.5-325 mg/15 ml solution TAKE 20 ML EVERY 6 HOURS AS NEEDED FOR PAIN 473 mL 0  . hydrOXYzine (ATARAX/VISTARIL) 25 MG tablet TAKE 1 TABLET BY MOUTH EVERY 8 HOURS AS NEEDED FOR ANXIETY OR ITCHING. 90 tablet 0  . ibuprofen (ADVIL) 200 MG tablet     . loratadine (CLARITIN) 10 MG tablet Take 10 mg by mouth daily.    . metoprolol tartrate (LOPRESSOR) 25 MG tablet TAKE 1 TABLET BY MOUTH TWICE A DAY -MAY CRUSH IN FOOD 180 tablet 0  . mometasone (ELOCON) 0.1 % ointment APPLY TO AFFECTED AREA EVERY DAY AS NEEDED FOR RASH 45 g 0  . Multiple Vitamins-Minerals (AIRBORNE GUMMIES PO) Take 1 each by mouth daily.    .  ondansetron (ZOFRAN-ODT) 4 MG disintegrating tablet USE UP TO THREE TIMES A DAY AS NEEDED FOR NAUSEA. 10 tablet 0  . polyethylene glycol powder (GLYCOLAX/MIRALAX) powder TAKE 17 GRAMS BY MOUTH 2 TIMES DAILY AS NEEDED. 527 g 5  . pravastatin (PRAVACHOL) 20 MG tablet TAKE 1 TABLET EVERY DAY 90 tablet 0  . PROCTOFOAM HC rectal foam APPLY FOAM THREE TIMES A DAY AS NEEDED FOR 7 DAYS 10 g 10  . ranitidine (ZANTAC) 75 MG/5ML syrup TAKE 10 MLS BY MOUTH 2 TIMES DAILY AS NEEDED. FOR INDIGESTION. 300 mL 1  . sitaGLIPtin (JANUVIA) 100 MG tablet Take 1 tablet (100 mg total) by mouth daily. 90 tablet 0  . terazosin (HYTRIN) 5 MG capsule Take by mouth daily.  3  . Triamcinolone Acetonide (NASACORT ALLERGY 24HR NA) Place into the nose.    . venlafaxine XR (EFFEXOR-XR) 75 MG 24 hr capsule TAKE 1 CAPSULE (75 MG TOTAL) BY MOUTH DAILY WITH BREAKFAST. 90 capsule 0   No current facility-administered medications on file prior to visit.      Objective:  Objective  Physical Exam  Constitutional: He is oriented to person, place, and time. Vital signs are normal. He appears well-developed and well-nourished. He is sleeping.  HENT:  Head: Normocephalic and atraumatic.  Nose: Right sinus exhibits maxillary sinus tenderness and frontal sinus tenderness. Left sinus exhibits maxillary sinus tenderness and frontal sinus tenderness.  Mouth/Throat: Posterior oropharyngeal erythema present.  Eyes: Pupils are equal, round, and reactive to light. EOM are normal.  Neck: Normal range of motion. Neck supple. No thyromegaly present.  Cardiovascular: Normal rate and regular rhythm.  No murmur heard. Pulmonary/Chest: Effort normal and breath sounds normal. No respiratory distress. He has no wheezes. He has no rales. He exhibits no tenderness.  Musculoskeletal: He exhibits no edema or tenderness.  Neurological: He is alert and oriented to person, place, and time.  Skin: Skin is warm and dry.  Psychiatric: He has a normal mood and  affect. His behavior is normal. Judgment and thought content normal.  Nursing note and vitals reviewed.  BP 98/66 (BP Location: Right Arm, Cuff Size: Normal)   Pulse 75   Temp 98.1 F (36.7 C) (Oral)   Resp 16   Ht 5\' 10"  (1.778 m)   Wt 191 lb (86.6 kg)   SpO2 97%   BMI 27.41 kg/m  Wt Readings from Last 3 Encounters:  03/12/18 191 lb (86.6 kg)  02/11/18 195 lb (88.5 kg)  12/21/17 188 lb 12.8 oz (85.6 kg)     Lab Results  Component Value Date   WBC 9.5 12/10/2017   HGB 14.1 12/10/2017   HCT 41.9 12/10/2017  PLT 350.0 12/10/2017   GLUCOSE 142 (H) 12/10/2017   CHOL 158 12/10/2017   TRIG 260.0 (H) 12/10/2017   HDL 30.00 (L) 12/10/2017   LDLDIRECT 80.0 12/10/2017   LDLCALC 102 (H) 08/04/2017   ALT 17 12/10/2017   AST 15 12/10/2017   NA 143 12/10/2017   K 4.7 12/10/2017   CL 104 12/10/2017   CREATININE 1.27 12/10/2017   BUN 12 12/10/2017   CO2 31 12/10/2017   TSH 2.89 02/19/2017   PSA 1.01 10/03/2014   INR 1.11 02/10/2015   HGBA1C 7.5 (A) 12/21/2017   MICROALBUR 0.7 08/04/2017    US Venous Img Upper Uni Right  Result Date: 02/14/2015 CLINICAL DATA:  Right arm and neck pain, recent hospitalization EXAM: Right UPPER EXTREMITY VENOUS DOPPLER ULTRASOUND TECHNIQUE: Gray-scale sonography with graded compression, as well as color Doppler and duplex ultrasound were performed to evaluate the upper extremity deep venous system from the level of the subclavian vein and including the jugular, axillary, basilic, radial, ulnar and upper cephalic vein. Spectral Doppler was utilized to evaluate flow at rest and with distal augmentation maneuvers. COMPARISON:  None. FINDINGS: Contralateral Subclavian Vein: Respiratory phasicity is normal and symmetric with the symptomatic side. No evidence of thrombus. Normal compressibility. Internal Jugular Vein: No evidence of thrombus. Normal compressibility, respiratory phasicity and response to augmentation. Subclavian Vein: No evidence of thrombus.  Normal compressibility, respiratory phasicity and response to augmentation. Axillary Vein: No evidence of thrombus. Normal compressibility, respiratory phasicity and response to augmentation. Cephalic Vein: No evidence of thrombus. Normal compressibility, respiratory phasicity and response to augmentation. Basilic Vein: No evidence of thrombus. Normal compressibility, respiratory phasicity and response to augmentation. Brachial Veins: No evidence of thrombus. Normal compressibility, respiratory phasicity and response to augmentation. Radial Veins: No evidence of thrombus. Normal compressibility, respiratory phasicity and response to augmentation. Ulnar Veins: No evidence of thrombus. Normal compressibility, respiratory phasicity and response to augmentation. Venous Reflux:  None visualized. Other Findings:  None visualized. IMPRESSION: No evidence of deep venous thrombosis. Electronically Signed   By: Conchita Paris M.D.   On: 02/14/2015 16:30     Assessment & Plan:  Plan  I have discontinued Dellis Filbert L. Chesnut's fenofibrate micronized. I am also having him start on triamcinolone, triamcinolone cream, azithromycin, and NONFORMULARY OR COMPOUNDED ITEM. Additionally, I am having him maintain his [place] PO, [employer]S CHILDRENS ASPIRIN, ondansetron, ibuprofen, diphenhydrAMINE, guaiFENesin, mometasone, Triamcinolone Acetonide (NASACORT ALLERGY 24HR NA), loratadine, empagliflozin, sitaGLIPtin, gentamicin cream, Multiple Vitamins-Minerals (AIRBORNE GUMMIES PO), Azelastine HCl, PROCTOFOAM HC, BAYER MICROLET LANCETS, gabapentin, polyethylene glycol powder, allopurinol, glucose blood, ranitidine, metoprolol tartrate, pravastatin, venlafaxine XR, hydrOXYzine, cyclobenzaprine, amLODipine, HYDROcodone-acetaminophen, and terazosin.  Meds ordered this encounter  Medications  . triamcinolone (NASACORT) 55 MCG/ACT AERO nasal inhaler    Sig: Place 2 sprays into the nose daily.    Dispense:  1 Inhaler    Refill:  12    . triamcinolone cream (KENALOG) 0.1 %    Sig: Apply 1 application topically 2 (two) times daily.    Dispense:  30 g    Refill:  0  . azithromycin (ZITHROMAX Z-PAK) 250 MG tablet    Sig: As directed    Dispense:  6 each    Refill:  0  . NONFORMULARY OR COMPOUNDED ITEM    Sig: Compression socks  #1   15-20 mm/hg  Dx edema    Dispense:  1 each    Refill:  1    Problem List Items Addressed This Visit  Unprioritized   Insect bite    Healing well Can use antihistamine prn       Relevant Medications   triamcinolone cream (KENALOG) 0.1 %   Sinus infection - Primary    Use steroid nasal spray and antihistamine abx per orders rto prn      Relevant Medications   triamcinolone (NASACORT) 55 MCG/ACT AERO nasal inhaler   azithromycin (ZITHROMAX Z-PAK) 250 MG tablet    Other Visit Diagnoses    Other migraine without status migrainosus, not intractable       Relevant Medications   terazosin (HYTRIN) 5 MG capsule   Other Relevant Orders   Ambulatory referral to Neurology      Follow-up: Return if symptoms worsen or fail to improve.  Ann Held, DO

## 2018-03-15 ENCOUNTER — Telehealth: Payer: Self-pay

## 2018-03-15 NOTE — Telephone Encounter (Signed)
-----   Message from Larey Seat, MD sent at 03/12/2018 10:10 AM EDT ----- IMPRESSION:  1. Mild Central apnea and Insomnia. 2. Primary Snoring. 3. Abnormal EKG.  RECOMMENDATIONS:  1. Advise cardiac evaluation for arrhythmia, and pre medication  for insomnia. 2. This apnea is too mild and not associated with hypoxemia to  require PAP treatment. His residual AHI on CPAP was 4.8- not any  improvement over baseline.  3. Neuropsychological evaluation for possible cerebrovascular  disease versus neurodegenerative disease in the setting of  cognitive decline. I send a copy to Dr. Antony Contras, MD.

## 2018-03-15 NOTE — Telephone Encounter (Signed)
I called pt and explained his sleep study results. Pt is questioning the validity of this sleep study. Pt says that he was in quite a bit of pain the night of his sleep study and did not sleep well. Pt also says that the sensor that was placed in his nose caused an allergic reaction and he had a hard time breathing. I advised pt that his total sleep time that night was 146 minutes. He is very concerned about having another stroke. Pt is asking for a call back from Dr. Brett Fairy to discuss.

## 2018-03-16 DIAGNOSIS — W57XXXA Bitten or stung by nonvenomous insect and other nonvenomous arthropods, initial encounter: Secondary | ICD-10-CM | POA: Insufficient documentation

## 2018-03-16 HISTORY — DX: Bitten or stung by nonvenomous insect and other nonvenomous arthropods, initial encounter: W57.XXXA

## 2018-03-16 NOTE — Assessment & Plan Note (Signed)
Healing well Can use antihistamine prn

## 2018-03-16 NOTE — Assessment & Plan Note (Signed)
Use steroid nasal spray and antihistamine abx per orders rto prn

## 2018-03-17 NOTE — Telephone Encounter (Signed)
I spoke to [name] this morning - He is concerned about the valididty of the sleep study in the setting of such reduced sleep time.- insomnia due to pain.  If Dr. Etter Sjogren will address the pain, I will repeat a sleep study- likey a HST to get done quicker than attended sleep study.   I will ask Dr. Etter Sjogren if she can address the pain issue, or will refer to pain treatment.   Larey Seat, MD

## 2018-03-21 ENCOUNTER — Other Ambulatory Visit: Payer: Self-pay | Admitting: Family Medicine

## 2018-03-22 ENCOUNTER — Other Ambulatory Visit: Payer: Self-pay | Admitting: Family Medicine

## 2018-03-22 ENCOUNTER — Telehealth: Payer: Self-pay

## 2018-03-22 DIAGNOSIS — G4719 Other hypersomnia: Secondary | ICD-10-CM

## 2018-03-22 DIAGNOSIS — M25511 Pain in right shoulder: Secondary | ICD-10-CM

## 2018-03-22 DIAGNOSIS — G43809 Other migraine, not intractable, without status migrainosus: Secondary | ICD-10-CM

## 2018-03-22 DIAGNOSIS — K219 Gastro-esophageal reflux disease without esophagitis: Secondary | ICD-10-CM

## 2018-03-22 NOTE — Telephone Encounter (Signed)
Referred to ortho first per Dr. Etter Sjogren. See result note comments.

## 2018-03-22 NOTE — Telephone Encounter (Signed)
Author phoned pt. to relay Dr. Nonda Lou recommendation for ortho referral. No answer. Author left detailed VM notifying of ortho referral placement and asked to return call if any questions.

## 2018-03-22 NOTE — Telephone Encounter (Signed)
-----   Message from Ann Held, DO sent at 03/22/2018 10:01 AM EDT ----- If pain is in shoulder---  He has seen sport med before-- may need ortho referral before pain management

## 2018-03-22 NOTE — Telephone Encounter (Signed)
Refer to pain management 

## 2018-03-22 NOTE — Telephone Encounter (Signed)
See sleep study results

## 2018-03-25 ENCOUNTER — Encounter (INDEPENDENT_AMBULATORY_CARE_PROVIDER_SITE_OTHER): Payer: Self-pay | Admitting: Orthopedic Surgery

## 2018-03-25 ENCOUNTER — Ambulatory Visit (INDEPENDENT_AMBULATORY_CARE_PROVIDER_SITE_OTHER): Payer: Self-pay

## 2018-03-25 ENCOUNTER — Ambulatory Visit (INDEPENDENT_AMBULATORY_CARE_PROVIDER_SITE_OTHER): Payer: BLUE CROSS/BLUE SHIELD | Admitting: Orthopedic Surgery

## 2018-03-25 DIAGNOSIS — M25512 Pain in left shoulder: Secondary | ICD-10-CM

## 2018-03-25 DIAGNOSIS — M542 Cervicalgia: Secondary | ICD-10-CM

## 2018-03-25 DIAGNOSIS — G8929 Other chronic pain: Secondary | ICD-10-CM | POA: Diagnosis not present

## 2018-03-25 DIAGNOSIS — M25511 Pain in right shoulder: Secondary | ICD-10-CM | POA: Diagnosis not present

## 2018-03-25 NOTE — Telephone Encounter (Signed)
Zantac recalled at this time.
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
## 2018-03-28 NOTE — Telephone Encounter (Signed)
He can switch to otc pepcid or we can rx omeprazole 20 mg daily  #20  2 refills

## 2018-04-02 ENCOUNTER — Encounter: Payer: Self-pay | Admitting: Family Medicine

## 2018-04-02 ENCOUNTER — Other Ambulatory Visit: Payer: Self-pay | Admitting: Family Medicine

## 2018-04-02 DIAGNOSIS — M546 Pain in thoracic spine: Secondary | ICD-10-CM | POA: Diagnosis not present

## 2018-04-02 DIAGNOSIS — M25511 Pain in right shoulder: Secondary | ICD-10-CM

## 2018-04-02 DIAGNOSIS — M2550 Pain in unspecified joint: Secondary | ICD-10-CM

## 2018-04-02 DIAGNOSIS — G8929 Other chronic pain: Secondary | ICD-10-CM

## 2018-04-02 DIAGNOSIS — M9903 Segmental and somatic dysfunction of lumbar region: Secondary | ICD-10-CM | POA: Diagnosis not present

## 2018-04-02 DIAGNOSIS — M722 Plantar fascial fibromatosis: Secondary | ICD-10-CM | POA: Diagnosis not present

## 2018-04-02 DIAGNOSIS — M542 Cervicalgia: Secondary | ICD-10-CM | POA: Diagnosis not present

## 2018-04-02 MED ORDER — HYDROCODONE-ACETAMINOPHEN 7.5-325 MG/15ML PO SOLN: ORAL | 0 refills | Status: DC

## 2018-04-02 NOTE — Telephone Encounter (Signed)
So Dr Marlou Sa decided to not do MRI c spine?   I refilled the med this time Did he give him an alternative plan?

## 2018-04-03 ENCOUNTER — Other Ambulatory Visit: Payer: Self-pay | Admitting: Internal Medicine

## 2018-04-03 ENCOUNTER — Other Ambulatory Visit: Payer: BLUE CROSS/BLUE SHIELD

## 2018-04-03 DIAGNOSIS — E1165 Type 2 diabetes mellitus with hyperglycemia: Secondary | ICD-10-CM

## 2018-04-03 DIAGNOSIS — IMO0002 Reserved for concepts with insufficient information to code with codable children: Secondary | ICD-10-CM

## 2018-04-03 DIAGNOSIS — E1151 Type 2 diabetes mellitus with diabetic peripheral angiopathy without gangrene: Secondary | ICD-10-CM

## 2018-04-05 NOTE — Telephone Encounter (Signed)
Author phoned pt. to follow-up on ranitidine rx. Pt. States he will take any alternative that is easy to swallow. Author phoned preferred pharmacy to get suggestions for alternative, but was unable to reach pharmacist.   Pt. Also wanted to relay to Dr. Etter Sjogren that sleep is still a struggle for him. He often feels drowsy, but not sleepy, and can feel "jumpy" at times too. Pt. Reviewed with Pryor Curia the medications he takes at night: terazosin, amlodopine, gabapentin, allopurinol, pravastatin, hydoxyzine, metoprolol, and cyclobenzaprine, with hycet prn. Pt. Also requests a neurology referral to a different neurologist re: sleep study and migraines for second opinion. Routed to Dr. Etter Sjogren to review concerns.

## 2018-04-05 NOTE — Telephone Encounter (Signed)
I pretty sure I already said pepcid  Refer to pain management  Where would he like to go to neuro--- [facility] ?

## 2018-04-06 NOTE — Telephone Encounter (Signed)
Sounds like ins wants him to try PT first --- we can refer to pt if he is willing to go--- if he is unable to tolerate pt then ins may be more willing to pay

## 2018-04-08 ENCOUNTER — Ambulatory Visit: Payer: BLUE CROSS/BLUE SHIELD | Admitting: Internal Medicine

## 2018-04-08 MED ORDER — FAMOTIDINE 40 MG/5ML PO SUSR: 20.0000 mg | Freq: Every day | ORAL | 0 refills | Status: DC

## 2018-04-08 NOTE — Telephone Encounter (Signed)
Author phoned pt. to review Dr. Nonda Lou recommendations. Pt. agreeable to plan for different neuro referral and pain management referrals. Pepcid rx sent into preferred pharmacy per pt. Request. Pt. sounded very lethargic over the phone, speech intact, but unable to concentrate on conversation. Routed to Dr. Etter Sjogren as Juluis Rainier.

## 2018-04-08 NOTE — Addendum Note (Signed)
Addended by: Raynelle Dick R on: 04/08/2018 10:26 AM   Modules accepted: Orders

## 2018-04-12 DIAGNOSIS — N411 Chronic prostatitis: Secondary | ICD-10-CM | POA: Diagnosis not present

## 2018-04-30 ENCOUNTER — Other Ambulatory Visit: Payer: Self-pay | Admitting: Family Medicine

## 2018-05-01 ENCOUNTER — Other Ambulatory Visit: Payer: Self-pay | Admitting: Family Medicine

## 2018-05-01 DIAGNOSIS — I10 Essential (primary) hypertension: Secondary | ICD-10-CM

## 2018-05-01 DIAGNOSIS — E785 Hyperlipidemia, unspecified: Secondary | ICD-10-CM

## 2018-05-01 DIAGNOSIS — K219 Gastro-esophageal reflux disease without esophagitis: Secondary | ICD-10-CM

## 2018-05-03 ENCOUNTER — Telehealth: Payer: Self-pay

## 2018-05-03 NOTE — Telephone Encounter (Signed)
PA for Vania Rea has been denied by patient insurance.

## 2018-05-03 NOTE — Telephone Encounter (Signed)
We have been through this multiple times in the past and last time they approved it.  Let us try to find the old one and try and appeal.

## 2018-05-04 DIAGNOSIS — N411 Chronic prostatitis: Secondary | ICD-10-CM | POA: Diagnosis not present

## 2018-05-04 DIAGNOSIS — N401 Enlarged prostate with lower urinary tract symptoms: Secondary | ICD-10-CM | POA: Diagnosis not present

## 2018-05-04 DIAGNOSIS — N138 Other obstructive and reflux uropathy: Secondary | ICD-10-CM | POA: Diagnosis not present

## 2018-05-04 DIAGNOSIS — R399 Unspecified symptoms and signs involving the genitourinary system: Secondary | ICD-10-CM | POA: Diagnosis not present

## 2018-05-04 DIAGNOSIS — R339 Retention of urine, unspecified: Secondary | ICD-10-CM | POA: Diagnosis not present

## 2018-05-06 NOTE — Telephone Encounter (Signed)
Form submitted.

## 2018-05-10 ENCOUNTER — Ambulatory Visit (INDEPENDENT_AMBULATORY_CARE_PROVIDER_SITE_OTHER): Payer: BLUE CROSS/BLUE SHIELD | Admitting: Neurology

## 2018-05-10 ENCOUNTER — Encounter: Payer: Self-pay | Admitting: Neurology

## 2018-05-10 VITALS — BP 128/78 | HR 91 | Ht 70.0 in | Wt 194.0 lb

## 2018-05-10 DIAGNOSIS — G4719 Other hypersomnia: Secondary | ICD-10-CM | POA: Diagnosis not present

## 2018-05-10 DIAGNOSIS — J0100 Acute maxillary sinusitis, unspecified: Secondary | ICD-10-CM | POA: Diagnosis not present

## 2018-05-10 DIAGNOSIS — R351 Nocturia: Secondary | ICD-10-CM

## 2018-05-10 DIAGNOSIS — K922 Gastrointestinal hemorrhage, unspecified: Secondary | ICD-10-CM

## 2018-05-10 DIAGNOSIS — J069 Acute upper respiratory infection, unspecified: Secondary | ICD-10-CM

## 2018-05-10 DIAGNOSIS — F332 Major depressive disorder, recurrent severe without psychotic features: Secondary | ICD-10-CM

## 2018-05-10 DIAGNOSIS — G4730 Sleep apnea, unspecified: Secondary | ICD-10-CM | POA: Diagnosis not present

## 2018-05-10 DIAGNOSIS — G4731 Primary central sleep apnea: Secondary | ICD-10-CM

## 2018-05-10 DIAGNOSIS — F5104 Psychophysiologic insomnia: Secondary | ICD-10-CM | POA: Diagnosis not present

## 2018-05-10 MED ORDER — GUAIFENESIN ER 600 MG PO TB12: 600.0000 mg | ORAL_TABLET | Freq: Two times a day (BID) | ORAL | 1 refills | Status: DC

## 2018-05-10 NOTE — Progress Notes (Addendum)
SLEEP MEDICINE CLINIC   Provider:  Larey Seat, MD   Primary Care Physician:  Chase [name], Chase Apa, DO   Referring Provider: Carollee [name], Chase [name], *    Chief Complaint  Patient presents with  . New Patient (Initial Visit)    pt alone, rm 11. pt states that he is having increase in daytime sleepiness, memory difficulties and headaches. current CPAP user, this past June have been 5 years with current machine. Last sleep study completed here on 11/17/2012.     HPI:  Chase [name] is a 57 y.o. male , seen here as in a re-referral from Dr. Carollee [name] for further evaluation of CPAP needs, in the setting of cognitive challenges.   05-10-2018 , I have the pleasure of meeting today with [name] on 10 May 2018.  Chase [name], 57 years old,  had undergone a baseline polysomnography on 01 March 2018.  He reported trouble sleeping through the night excessive daytime sleepiness and pain at the jaw and atypical facial pain. He often wakes up with headaches but he also has a long history of snoring and excessive daytime sleepiness.  His repeat sleep study showed an AHI of only 5.8/h, there was no REM sleep recorded, and all sleep in nonsupine position.  A total sleep time of only 145 minutes was recorded sleep efficiency was extremely poor at only 38.3% of the recorded night.  Chase [name] struggled with airway congestion sniffling and coughing all of which delayed his sleep onset and reduce the recorded sleep time.  I wrote that the apnea would be to mild and apparently is not excessively associated hypoxemia to require Pap treatment.  I requested neuropsychological evaluation, Dr. Etter [name] had seen the patient after the sleep test found that he had suffered from acute sinusitis and had begun treating him for this.  Soon after the treatment was initiated he felt better. He continues to use his CPAP at night as this helps him stay awake in daytime.  He had multiple mini-strokes in the past, and  feels this therapy will protect him. He needs a new machine - his is over 80 years old.  He has a lot of sinus disease. ENT has not seen  him - I like for him to see Dr. Wilburn [name].   Auto titration machine will be ordered and he follow up with NP. I will order 5-15 cm water pressure, his preferred mask is a nasal mask, by [employer] in medium . He also had a dream wear nasal cradle mask, [facility].    Chief complaint according to patient : [name] states that he is having increase in daytime sleepiness, memory difficulties and headaches. He is a current CPAP user, this past June has been 5 years with current CPAP machine. DM is worse, aphasia increased, headaches are bad and back. Last sleep study completed here on 11/17/2012.  I have the pleasure of meeting today with Chase [name], and meanwhile 57 year old Caucasian right-handed gentleman primary care patient of Dr. Garnet [name].  Over 5 years ago the patient was referred by Chase [name], at the time at [facility].  He was 57 years old, had suffered several strokes, suffered from headaches, GERD, osteoarthritis, respiratory allergies, uncontrolled diabetes and hypertension hyperlipidemia, upper airway resistance the syndrome and mild obstructive sleep apnea had been already diagnosed in 2008, at the time by Chase [name].  He reported trouble sleeping through the night, excessive daytime sleepiness and pain  at the jaw which made it difficult to sleep tolerate certain headgear's.  He was known to snore and he woke up this morning headaches.  His Epworth Sleepiness Scale had been endorsed at 17 points his AHI was 74.9, REM sleep had not been observed.  The study was split and CPAP titrated to 7 cmH2O REM sleep was still not seen, only 10 central apneas remained all obstructive apneas have been covered.  7 cm water pressure seemed to be partially effective.  The patient also brought to his compliance record here today, in the last 30 days he has  used the machine 27 out of 30 days and 18 of those days over 4 hours, average use of time is just below 4 hours at 3 hours 50 minutes, CPAP is still set at 7 cm water pressure with 3 cm EPR, residual AHI is 4.7.  Central apneas occupied 3.8/h obstructive apneas 0.4/h there is actually no major air leak noted.  The current interface seems to work well for him.  He had a sudden rise in apnea since 30 January 2018- he is not sure if this would correlate with a new mask he just received ( August 9 th ).  Sleep habits are as follows: Dinner time is between 5- 9 PM, a wide range. Watches Tv after dinner, multiple hours. Goes to the bedroom between 11 PMand 8 AM ( yes, 9 hours) and is most nights asleep promptly- he sleeps on his back and sides, one pillow. Bedroom is cool and quiet , not all dark.  He uses electronics in the bedroom, he has his smart phone and grabs it when he goes to the bathroom. Nocturia 1-3 times.  He sleeps a total of about 7 hours. When he wakes up he reports headaches, sinus headaches, of a quality that he described as dull, throbbing and generalized- forehead, top of the head, an he feels sinus pressure, too.   He wakes up spontanously - cannot recall he last time he felt refreshed, restored.  Sleep medical history and family sleep history:  No family history of OSA, brother has HTN, thyroid disease.  Mother had Huntington's disease- he and his brother have not had signs.  She died of breast cancer.     Social history:   Non smoker, but a lot of second hand exposure, ETOH none, caffeine - rare.  Disabled for over 5 years- since CVA. He lives with his best friend " Chase [name] ".     Review of Systems: Out of a complete 14 system review, the patient complains of only the following symptoms, and all other reviewed systems are negative.  Snoring, EDS, major depression, nocturia, poor sleep habits, cataract vision impairment, multiple strokes ( Chase [name] Chase [name]) . Facial asymmetry.    Epworth Sleepiness score: 15/ 24  , Fatigue severity score 48  , depression score 10/ 15 major depression- on effexor.    Social History   Socioeconomic History  . Marital status: Single    Spouse name: Not on file  . Number of children: 0  . Years of education: Not on file  . Highest education level: Not on file  Occupational History  . Not on file  Social Needs  . Financial resource strain: Not on file  . Food insecurity:    Worry: Not on file    Inability: Not on file  . Transportation needs:    Medical: Not on file    Non-medical: Not on file  Tobacco  Use  . Smoking status: Never Smoker  . Smokeless tobacco: Never Used  Substance and Sexual Activity  . Alcohol use: No    Alcohol/week: 0.0 standard drinks    Comment: none  . Drug use: No  . Sexual activity: Never  Lifestyle  . Physical activity:    Days per week: Not on file    Minutes per session: Not on file  . Stress: Not on file  Relationships  . Social connections:    Talks on phone: Not on file    Gets together: Not on file    Attends religious service: Not on file    Active member of club or organization: Not on file    Attends meetings of clubs or organizations: Not on file    Relationship status: Not on file  . Intimate partner violence:    Fear of current or ex partner: Not on file    Emotionally abused: Not on file    Physically abused: Not on file    Forced sexual activity: Not on file  Other Topics Concern  . Not on file  Social History Narrative   No caffeine intake except for chocolate.  Exercised-walking    Family History  Problem Relation Age of Onset  . Breast cancer Mother 92       breast  . Huntington's disease Mother   . Stroke Father   . Heart disease Father   . Hypertension Father   . Heart attack Father   . Hypertension Brother   . Hyperlipidemia Neg Hx   . Diabetes Neg Hx     Past Medical History:  Diagnosis Date  . Allergy   . Arthritis   . Blood in stool   .  Brain cyst   . Chest pain   . Constipation   . Diabetes mellitus without complication ([place])   . Difficulty urinating   . GERD (gastroesophageal reflux disease)   . Headache(784.0)   . Hearing loss   . Hyperlipidemia   . Hypertension   . Leg swelling   . Nasal congestion   . PONV (postoperative nausea and vomiting)   . Rectal pain   . Rectal ulcer with bleeding after hemorrhoid banding 02/11/2015  . Sleep apnea   . Stroke ([place])   . Stroke ([place])   . Thrombosed hemorrhoids   . Trouble swallowing     Past Surgical History:  Procedure Laterality Date  . CATARACT EXTRACTION     x 3  . [place]   cataracts  . FLEXIBLE SIGMOIDOSCOPY N/A 02/11/2015   Procedure: FLEXIBLE SIGMOIDOSCOPY;  Surgeon: Gatha Mayer, MD;  Location: [facility];  Service: Endoscopy;  Laterality: N/A;  . INNER EAR SURGERY     rt ear  . INNER EAR SURGERY  1992  . surgery - right arm     1983    Current Outpatient Medications  Medication Sig Dispense Refill  . allopurinol (ZYLOPRIM) 100 MG tablet TAKE 1 TABLET BY MOUTH EVERY DAY 90 tablet 1  . amLODipine (NORVASC) 5 MG tablet Take 1 tablet (5 mg total) by mouth daily. 30 tablet 5  . Azelastine HCl 0.15 % SOLN PLACE 2 SPRAYS IN EACH NOSTRIL every night 30 mL 11  . azithromycin (ZITHROMAX Z-PAK) 250 MG tablet As directed 6 each 0  . BAYER MICROLET LANCETS lancets Use 1x a day 100 each 4  . cyclobenzaprine (FLEXERIL) 10 MG tablet Take 1 tablet (10 mg total) by mouth at  bedtime as needed for muscle spasms. 30 tablet 1  . diphenhydrAMINE (BENADRYL) 25 MG tablet     . empagliflozin (JARDIANCE) 25 MG TABS tablet Take 25 mg by mouth daily. 90 tablet 3  . famotidine (PEPCID) 40 MG/5ML suspension TAKE 2.5 MLS (20 MG TOTAL) BY MOUTH DAILY. 50 mL 0  . FIBER SELECT GUMMIES PO Take 1 tablet by mouth daily.    Marland Kitchen gabapentin (NEURONTIN) 100 MG capsule Take 1 capsule (100 mg total) by mouth 3 (three) times daily. 90 capsule 3  . gentamicin cream  (GARAMYCIN) 0.1 % Apply 1 application topically 3 (three) times daily. 30 g 1  . glucose blood test strip Use 1x a day - E11.51, E11.65 Dispense Contour Next Strips 100 each 3  . GuaiFENesin 200 MG/10ML SOLN     . HYDROcodone-acetaminophen (HYCET) 7.5-325 mg/15 ml solution TAKE 20 ML EVERY 6 HOURS AS NEEDED FOR PAIN 473 mL 0  . hydrOXYzine (ATARAX/VISTARIL) 25 MG tablet TAKE 1 TABLET BY MOUTH EVERY 8 HOURS AS NEEDED FOR ANXIETY OR ITCHING. 90 tablet 0  . ibuprofen (ADVIL) 200 MG tablet     . JANUVIA 100 MG tablet TAKE 1 TABLET BY MOUTH EVERY DAY 30 tablet 2  . loratadine (CLARITIN) 10 MG tablet Take 10 mg by mouth daily.    . metoprolol tartrate (LOPRESSOR) 25 MG tablet TAKE 1 TABLET BY MOUTH TWICE A DAY -MAY CRUSH IN FOOD 180 tablet 0  . mometasone (ELOCON) 0.1 % ointment APPLY TO AFFECTED AREA EVERY DAY AS NEEDED FOR RASH 45 g 0  . Multiple Vitamins-Minerals (AIRBORNE GUMMIES PO) Take 1 each by mouth daily.    . NONFORMULARY OR COMPOUNDED ITEM Compression socks  #1   15-20 mm/hg  Dx edema 1 each 1  . ondansetron (ZOFRAN-ODT) 4 MG disintegrating tablet USE UP TO THREE TIMES A DAY AS NEEDED FOR NAUSEA. 10 tablet 0  . polyethylene glycol powder (GLYCOLAX/MIRALAX) powder TAKE 17 GRAMS BY MOUTH 2 TIMES DAILY AS NEEDED. 527 g 5  . pravastatin (PRAVACHOL) 20 MG tablet TAKE 1 TABLET BY MOUTH EVERY DAY 90 tablet 0  . PROCTOFOAM HC rectal foam APPLY FOAM THREE TIMES A DAY AS NEEDED FOR 7 DAYS 10 g 10  . terazosin (HYTRIN) 5 MG capsule Take by mouth daily.  3  . triamcinolone (NASACORT) 55 MCG/ACT AERO nasal inhaler Place 2 sprays into the nose daily. 1 Inhaler 12  . Triamcinolone Acetonide (NASACORT ALLERGY 24HR NA) Place into the nose.    . triamcinolone cream (KENALOG) 0.1 % Apply 1 application topically 2 (two) times daily. 30 g 0  . venlafaxine XR (EFFEXOR-XR) 75 MG 24 hr capsule TAKE 1 CAPSULE (75 MG TOTAL) BY MOUTH DAILY WITH BREAKFAST. 90 capsule 0   No current facility-administered medications  for this visit.     Allergies as of 05/10/2018 - Review Complete 05/10/2018  Allergen Reaction Noted  . Amoxicillin Hives, Itching, and Other (See Comments) 12/23/2006  . Fluticasone  05/02/2015  . Sulfa antibiotics Rash 01/30/2016  . Flonase [fluticasone propionate]  08/25/2014  . Terfenadine  12/09/2007    Vitals: BP 128/78   Pulse 91   Ht 5\' 10"  (1.778 m)   Wt 194 lb (88 kg)   BMI 27.84 kg/m  Last Weight:  Wt Readings from Last 1 Encounters:  05/10/18 194 lb (88 kg)   KCL:EXNT mass index is 27.84 kg/m.     Last Height:   Ht Readings from Last 1 Encounters:  05/10/18 5\' 10"  (  1.778 m)    Physical exam:  General: The patient is awake, alert and appears distressed. The patient is well groomed. He doesn't make eye contact..  Head: Normocephalic, atraumatic. Neck is supple. Mallampati 5- lost many teeth, poor dental status.  neck circumference:15.5 ". Nasal airflow patent, TMJ click and pain more on the left than right. Retrognathia is not seen.  Cardiovascular:  Regular rate and rhythm , without  murmurs or carotid bruit, and without distended neck veins. Respiratory: Lungs are clear to auscultation. Skin:   Ruddy skin, facial acne scars.  Without evidence of edema, or rash Trunk: BMI is 28. The patient's posture is stooped, he is frequently moving.    Neurologic exam : The patient is awake and alert, oriented to place and time.    Attention span & concentration ability appears limited. Speech; hoarseness with aphasia.  Mood and affect are severely depressed.   Cranial nerves: Pupils are equal and briskly reactive to light. Left eye deviates, esotropia,  Right eye vsion loss,Hearing loss- hearing aids.   Facial sensation intact to fine touch. Reports  Allodynia at times - trigeminal neuralgia?  Facial motor strength is asymmetric, but tongue in midline.  No fasciculations. Shoulder shrug was symmetrical.   Motor exam: diffuse elevated tone, symmetric muscle bulk in all  extremities. He has restriction in ROM of several joints, shoulder rotation, pronation and supination.  Sensory:  Fine touch, pinprick and vibration were  normal. Finger-to-nose maneuver  normal without evidence of ataxia, dysmetria or tremor. Gait and station: Patient walks without assistive device . Deep tendon reflexes: in the upper and lower extremities are symmetric and intact.   Assessment:  After physical and neurologic examination, review of laboratory studies,  Personal review of imaging studies, reports of other /same  Imaging studies, results of polysomnography and / or neurophysiology testing and pre-existing records as far as provided in visit., my assessment is:    1)  excessive daytime sleepiness - AHI was 5.8/h  By PSG- enough to continue CPAP. This time with autotitration device.  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    The patient was advised of the nature of the diagnosed disorder ( mild OSA )  , the treatment options and the  risks for general health and wellness arising from not treating the condition.   I spent more than 25 minutes of face to face time with the patient.  I will order an autotitration CPAP 5-15 cm water, with 2 cm EPR , heated humidity and nasal mask of patient's choice.  [facility].    Chase Seat, MD 53/[id number]/6734, 1:93 PM  Certified in Neurology by ABPN Certified in [place] by [facility] [address] [place], [place] [address]

## 2018-05-10 NOTE — Addendum Note (Signed)
Addended by: Larey Seat on: 05/10/2018 02:53 PM   Modules accepted: Orders

## 2018-05-11 ENCOUNTER — Other Ambulatory Visit: Payer: Self-pay | Admitting: Family Medicine

## 2018-05-11 DIAGNOSIS — M2550 Pain in unspecified joint: Secondary | ICD-10-CM

## 2018-05-11 DIAGNOSIS — M25511 Pain in right shoulder: Secondary | ICD-10-CM

## 2018-05-11 DIAGNOSIS — G8929 Other chronic pain: Secondary | ICD-10-CM

## 2018-05-11 MED ORDER — HYDROCODONE-ACETAMINOPHEN 7.5-325 MG/15ML PO SOLN: ORAL | 0 refills | Status: DC

## 2018-05-11 NOTE — Telephone Encounter (Signed)
Pt is requesting refill on hydrocodone.   Last OV: 03/12/2018 Last Fill: 04/02/2018 #452mL and 0RF Pt sig: 41mL q6h prn UDS: 08/04/2017 Low risk

## 2018-05-23 ENCOUNTER — Other Ambulatory Visit: Payer: Self-pay | Admitting: Family Medicine

## 2018-05-23 DIAGNOSIS — M25511 Pain in right shoulder: Secondary | ICD-10-CM

## 2018-05-28 DIAGNOSIS — R3915 Urgency of urination: Secondary | ICD-10-CM | POA: Diagnosis not present

## 2018-05-28 DIAGNOSIS — R3914 Feeling of incomplete bladder emptying: Secondary | ICD-10-CM | POA: Diagnosis not present

## 2018-05-31 DIAGNOSIS — H6983 Other specified disorders of Eustachian tube, bilateral: Secondary | ICD-10-CM | POA: Diagnosis not present

## 2018-05-31 DIAGNOSIS — Z9989 Dependence on other enabling machines and devices: Secondary | ICD-10-CM | POA: Diagnosis not present

## 2018-05-31 DIAGNOSIS — J342 Deviated nasal septum: Secondary | ICD-10-CM

## 2018-05-31 DIAGNOSIS — J343 Hypertrophy of nasal turbinates: Secondary | ICD-10-CM | POA: Insufficient documentation

## 2018-05-31 DIAGNOSIS — G4733 Obstructive sleep apnea (adult) (pediatric): Secondary | ICD-10-CM | POA: Diagnosis not present

## 2018-05-31 DIAGNOSIS — Z974 Presence of external hearing-aid: Secondary | ICD-10-CM | POA: Diagnosis not present

## 2018-05-31 DIAGNOSIS — Z8673 Personal history of transient ischemic attack (TIA), and cerebral infarction without residual deficits: Secondary | ICD-10-CM | POA: Diagnosis not present

## 2018-05-31 DIAGNOSIS — H903 Sensorineural hearing loss, bilateral: Secondary | ICD-10-CM | POA: Diagnosis not present

## 2018-05-31 HISTORY — DX: Hypertrophy of nasal turbinates: J34.3

## 2018-05-31 HISTORY — DX: Deviated nasal septum: J34.2

## 2018-06-01 DIAGNOSIS — G4733 Obstructive sleep apnea (adult) (pediatric): Secondary | ICD-10-CM | POA: Diagnosis not present

## 2018-06-07 DIAGNOSIS — R600 Localized edema: Secondary | ICD-10-CM | POA: Diagnosis not present

## 2018-06-07 DIAGNOSIS — J342 Deviated nasal septum: Secondary | ICD-10-CM | POA: Diagnosis not present

## 2018-06-14 ENCOUNTER — Encounter: Payer: Self-pay | Admitting: Internal Medicine

## 2018-06-15 ENCOUNTER — Other Ambulatory Visit: Payer: Self-pay

## 2018-06-15 MED ORDER — EMPAGLIFLOZIN 25 MG PO TABS: 25.0000 mg | ORAL_TABLET | Freq: Every day | ORAL | 3 refills | Status: DC

## 2018-06-16 DIAGNOSIS — G4733 Obstructive sleep apnea (adult) (pediatric): Secondary | ICD-10-CM | POA: Diagnosis not present

## 2018-06-21 ENCOUNTER — Other Ambulatory Visit: Payer: Self-pay | Admitting: Family Medicine

## 2018-06-24 ENCOUNTER — Other Ambulatory Visit: Payer: Self-pay | Admitting: Internal Medicine

## 2018-06-24 DIAGNOSIS — E1165 Type 2 diabetes mellitus with hyperglycemia: Secondary | ICD-10-CM

## 2018-06-24 DIAGNOSIS — IMO0002 Reserved for concepts with insufficient information to code with codable children: Secondary | ICD-10-CM

## 2018-06-24 DIAGNOSIS — E1151 Type 2 diabetes mellitus with diabetic peripheral angiopathy without gangrene: Secondary | ICD-10-CM

## 2018-06-25 ENCOUNTER — Encounter: Payer: Self-pay | Admitting: Family Medicine

## 2018-06-26 ENCOUNTER — Other Ambulatory Visit: Payer: Self-pay | Admitting: Family Medicine

## 2018-06-26 NOTE — Telephone Encounter (Signed)
This is controlled substance--- I don't see database scanned , contract

## 2018-06-30 DIAGNOSIS — J310 Chronic rhinitis: Secondary | ICD-10-CM | POA: Diagnosis not present

## 2018-06-30 DIAGNOSIS — J343 Hypertrophy of nasal turbinates: Secondary | ICD-10-CM | POA: Diagnosis not present

## 2018-06-30 DIAGNOSIS — J324 Chronic pansinusitis: Secondary | ICD-10-CM

## 2018-06-30 DIAGNOSIS — J342 Deviated nasal septum: Secondary | ICD-10-CM | POA: Diagnosis not present

## 2018-06-30 DIAGNOSIS — N401 Enlarged prostate with lower urinary tract symptoms: Secondary | ICD-10-CM | POA: Diagnosis not present

## 2018-06-30 DIAGNOSIS — N138 Other obstructive and reflux uropathy: Secondary | ICD-10-CM | POA: Diagnosis not present

## 2018-06-30 DIAGNOSIS — N411 Chronic prostatitis: Secondary | ICD-10-CM | POA: Diagnosis not present

## 2018-06-30 HISTORY — DX: Chronic pansinusitis: J32.4

## 2018-07-02 ENCOUNTER — Other Ambulatory Visit: Payer: Self-pay | Admitting: Family Medicine

## 2018-07-02 ENCOUNTER — Other Ambulatory Visit: Payer: Self-pay | Admitting: *Deleted

## 2018-07-02 DIAGNOSIS — G4733 Obstructive sleep apnea (adult) (pediatric): Secondary | ICD-10-CM | POA: Diagnosis not present

## 2018-07-02 DIAGNOSIS — G8929 Other chronic pain: Secondary | ICD-10-CM

## 2018-07-02 DIAGNOSIS — M2550 Pain in unspecified joint: Secondary | ICD-10-CM

## 2018-07-02 DIAGNOSIS — M25511 Pain in right shoulder: Secondary | ICD-10-CM

## 2018-07-02 DIAGNOSIS — K219 Gastro-esophageal reflux disease without esophagitis: Secondary | ICD-10-CM

## 2018-07-02 MED ORDER — FAMOTIDINE 40 MG/5ML PO SUSR: 20.0000 mg | Freq: Every day | ORAL | 5 refills | Status: DC

## 2018-07-02 MED ORDER — HYDROCODONE-ACETAMINOPHEN 7.5-325 MG/15ML PO SOLN: ORAL | 0 refills | Status: DC

## 2018-07-02 NOTE — Telephone Encounter (Signed)
Database ran and is on your desk for review.  Last filled per database: 05/11/18 Last written: 05/11/18 Last ov: 03/12/18 Next ov: none Contract: 08/04/18      UDS: need

## 2018-07-02 NOTE — Telephone Encounter (Signed)
Pt overdue for ov  rx sent in

## 2018-07-06 ENCOUNTER — Encounter: Payer: Self-pay | Admitting: Family Medicine

## 2018-07-06 DIAGNOSIS — Z9189 Other specified personal risk factors, not elsewhere classified: Secondary | ICD-10-CM

## 2018-07-07 ENCOUNTER — Telehealth: Payer: Self-pay | Admitting: *Deleted

## 2018-07-07 ENCOUNTER — Ambulatory Visit (INDEPENDENT_AMBULATORY_CARE_PROVIDER_SITE_OTHER): Payer: BLUE CROSS/BLUE SHIELD | Admitting: Podiatry

## 2018-07-07 DIAGNOSIS — L309 Dermatitis, unspecified: Secondary | ICD-10-CM

## 2018-07-07 DIAGNOSIS — L600 Ingrowing nail: Secondary | ICD-10-CM

## 2018-07-07 DIAGNOSIS — G8929 Other chronic pain: Secondary | ICD-10-CM

## 2018-07-07 DIAGNOSIS — E0843 Diabetes mellitus due to underlying condition with diabetic autonomic (poly)neuropathy: Secondary | ICD-10-CM

## 2018-07-07 MED ORDER — GABAPENTIN 100 MG PO CAPS: 100.0000 mg | ORAL_CAPSULE | Freq: Three times a day (TID) | ORAL | 3 refills | Status: DC

## 2018-07-07 MED ORDER — HYDROCODONE-ACETAMINOPHEN 7.5-325 MG/15ML PO SOLN: ORAL | 0 refills | Status: DC

## 2018-07-07 MED ORDER — CIPROFLOXACIN 500 MG/5ML (10%) PO SUSR: 500.0000 mg | Freq: Two times a day (BID) | ORAL | 0 refills | Status: DC

## 2018-07-07 MED ORDER — BETAMETHASONE DIPROPIONATE 0.05 % EX OINT: TOPICAL_OINTMENT | Freq: Two times a day (BID) | CUTANEOUS | 0 refills | Status: DC

## 2018-07-07 MED ORDER — CEPHALEXIN 250 MG/5ML PO SUSR: 500.0000 mg | Freq: Three times a day (TID) | ORAL | 0 refills | Status: DC

## 2018-07-07 NOTE — Telephone Encounter (Signed)
Liquid keflex sent. - Dr. Amalia Hailey

## 2018-07-07 NOTE — Telephone Encounter (Signed)
I spoke with [place] and she states Chase [name] called on pt, he is unable to afford the cipro liquid brand and the generic is not available and would like a suitable liquid alternative.

## 2018-07-07 NOTE — Telephone Encounter (Signed)
Chase [name] - [facility] called and left no pt information.

## 2018-07-07 NOTE — Patient Instructions (Signed)

## 2018-07-17 DIAGNOSIS — G4733 Obstructive sleep apnea (adult) (pediatric): Secondary | ICD-10-CM | POA: Diagnosis not present

## 2018-07-18 ENCOUNTER — Other Ambulatory Visit: Payer: Self-pay | Admitting: Podiatry

## 2018-07-18 NOTE — Progress Notes (Signed)
Subjective: 58 year old male presenting today for follow up evaluation status post temporary nail avulsion procedure of the lateral border of the left hallux that was done about one year ago. He states the area 'looks weird' and has a constant sharp pain. He reports associated clear drainage from the area. He has been applying antibiotic cream for treatment. There are no modifying factors noted. Patient is here for further evaluation and treatment.   Past Medical History:  Diagnosis Date  . Allergy   . Arthritis   . Blood in stool   . Brain cyst   . Chest pain   . Constipation   . Diabetes mellitus without complication ([place])   . Difficulty urinating   . GERD (gastroesophageal reflux disease)   . Headache(784.0)   . Hearing loss   . Hyperlipidemia   . Hypertension   . Leg swelling   . Nasal congestion   . PONV (postoperative nausea and vomiting)   . Rectal pain   . Rectal ulcer with bleeding after hemorrhoid banding 02/11/2015  . Sleep apnea   . Stroke ([place])   . Stroke ([place])   . Thrombosed hemorrhoids   . Trouble swallowing     Objective:  General: Well developed, nourished, in no acute distress, alert and oriented x3   Dermatology: Skin is warm, dry and supple bilateral. Lateral border of the left hallux appears to be erythematous with evidence of an ingrowing nail. Pain on palpation noted to the border of the nail fold. The remaining nails appear unremarkable at this time. There are no open sores, lesions. Dermatitis noted to lateral aspect of left foot.   Vascular: Dorsalis Pedis artery and Posterior Tibial artery pedal pulses palpable. No lower extremity edema noted.   Neruologic: Grossly intact via light touch bilateral.  Musculoskeletal: Muscular strength within normal limits in all groups bilateral. Normal range of motion noted to all pedal and ankle joints.   Assesement: #1 Paronychia with ingrowing nail lateral border left hallux  #2 Pain in toe #3 Incurvated  nail #4 Dermatitis lateral left foot #5 T2DM with hypersensitivity polyneuropathy   Plan of Care:  1. Patient evaluated.  2. Discussed treatment alternatives and plan of care. Explained nail avulsion procedure and post procedure course to patient. 3. Patient opted for permanent partial nail avulsion.  4. Prior to procedure, local anesthesia infiltration utilized using 3 ml of a 50:50 mixture of 2% plain lidocaine and 0.5% plain marcaine in a normal hallux block fashion and a betadine prep performed.  5. Partial permanent nail avulsion with chemical matrixectomy performed using [id number] applications of phenol followed by alcohol flush.  6. Light dressing applied. 7. Refill prescription for Gabapentin 100 mg TID provided to patient.  8. Prescription for Cipro 500 mg oral suspension provided to patient.  9. Prescription for Gentamicin cream provided to patient to use daily with a bandage.  10. Prescription for Vicodin 5/325 mg as needed for pain provided to patient.  11. Return to clinic in 3 weeks.   Edrick Kins, DPM [facility]  Dr. Edrick Kins, [place]                                        [place], [place] [address]                Office [phone]  Fax [phone]

## 2018-07-19 ENCOUNTER — Other Ambulatory Visit: Payer: Self-pay | Admitting: Family Medicine

## 2018-07-19 DIAGNOSIS — I10 Essential (primary) hypertension: Secondary | ICD-10-CM

## 2018-07-19 DIAGNOSIS — E785 Hyperlipidemia, unspecified: Secondary | ICD-10-CM

## 2018-07-25 ENCOUNTER — Other Ambulatory Visit: Payer: Self-pay | Admitting: Family Medicine

## 2018-07-25 DIAGNOSIS — K649 Unspecified hemorrhoids: Secondary | ICD-10-CM

## 2018-07-25 DIAGNOSIS — M25511 Pain in right shoulder: Secondary | ICD-10-CM

## 2018-07-25 DIAGNOSIS — W57XXXA Bitten or stung by nonvenomous insect and other nonvenomous arthropods, initial encounter: Secondary | ICD-10-CM

## 2018-07-28 ENCOUNTER — Encounter: Payer: Self-pay | Admitting: Podiatry

## 2018-07-28 ENCOUNTER — Ambulatory Visit (INDEPENDENT_AMBULATORY_CARE_PROVIDER_SITE_OTHER): Payer: BLUE CROSS/BLUE SHIELD | Admitting: Podiatry

## 2018-07-28 DIAGNOSIS — L600 Ingrowing nail: Secondary | ICD-10-CM

## 2018-07-28 DIAGNOSIS — G8929 Other chronic pain: Secondary | ICD-10-CM | POA: Diagnosis not present

## 2018-07-28 DIAGNOSIS — E0843 Diabetes mellitus due to underlying condition with diabetic autonomic (poly)neuropathy: Secondary | ICD-10-CM | POA: Diagnosis not present

## 2018-07-28 MED ORDER — HYDROCODONE-ACETAMINOPHEN 7.5-325 MG/15ML PO SOLN: ORAL | 0 refills | Status: DC

## 2018-07-28 MED ORDER — GABAPENTIN 100 MG PO CAPS: ORAL_CAPSULE | ORAL | 3 refills | Status: DC

## 2018-08-02 ENCOUNTER — Encounter: Payer: Self-pay | Admitting: Neurology

## 2018-08-02 DIAGNOSIS — G4733 Obstructive sleep apnea (adult) (pediatric): Secondary | ICD-10-CM | POA: Diagnosis not present

## 2018-08-02 NOTE — Progress Notes (Signed)
   Subjective: Patient presents today 2 weeks post ingrown nail permanent nail avulsion procedure of the lateral border of the left hallux. Patient states that the toe and nail fold is feeling much better. He reports some mild tenderness and scabbing of the area. Patient is here for further evaluation and treatment.   Past Medical History:  Diagnosis Date  . Allergy   . Arthritis   . Blood in stool   . Brain cyst   . Chest pain   . Constipation   . Diabetes mellitus without complication ([place])   . Difficulty urinating   . GERD (gastroesophageal reflux disease)   . Headache(784.0)   . Hearing loss   . Hyperlipidemia   . Hypertension   . Leg swelling   . Nasal congestion   . PONV (postoperative nausea and vomiting)   . Rectal pain   . Rectal ulcer with bleeding after hemorrhoid banding 02/11/2015  . Sleep apnea   . Stroke ([place])   . Stroke ([place])   . Thrombosed hemorrhoids   . Trouble swallowing     Objective: Skin is warm, dry and supple. Nail and respective nail fold appears to be healing appropriately. Open wound to the associated nail fold with a granular wound base and moderate amount of fibrotic tissue. Minimal drainage noted. Mild erythema around the periungual region likely due to phenol chemical matricectomy.  Assessment: #1 postop permanent partial nail avulsion lateral border left hallux  #2 open wound periungual nail fold of respective digit.   Plan of care: #1 patient was evaluated  #2 debridement of open wound was performed to the periungual border of the respective toe using a currette. Antibiotic ointment and Band-Aid was applied. #3 refill prescription for Vicodin 5/325 mg provided to patient.  #4 refill prescription for Gabapentin 100 mg TID provided to patient.  #5 patient is to return to clinic on a PRN basis.   Edrick Kins, DPM [facility]  Dr. Edrick Kins, [place]                                          [address] [place], [place] [address]                Office [phone]  Fax [phone]

## 2018-08-03 NOTE — Progress Notes (Signed)
[facility]  PATIENT: Chase [name] DOB: 23-Dec-1960   REASON FOR VISIT: Follow-up for ostructive sleep apnea with  CPAP HISTORY FROM: Patient    HISTORY OF PRESENT ILLNESS:UPDATE 2/19/2020CM Chase [name], 58 year old male returns for follow-up with history of obstructive sleep apnea here for CPAP compliance.  He has been doing well with his machine.  Compliance data dated 07/04/2018-08/02/2018 shows compliance greater than 4 hours at 97%.  Average usage 6 hours 8 minutes.  Set pressure 5 to 15 cm.  EPR level 3.  Leak 95th percentile 15.9.  AHI 6.2 he returns for reevaluation.   05-10-2018 , I have the pleasure of meeting today with Chase [name] on 10 May 2018.  Chase [name], 58 years old,  had undergone a baseline polysomnography on 01 March 2018.  He reported trouble sleeping through the night excessive daytime sleepiness and pain at the jaw and atypical facial pain. He often wakes up with headaches but he also has a long history of snoring and excessive daytime sleepiness.  His repeat sleep study showed an AHI of only 5.8/h, there was no REM sleep recorded, and all sleep in nonsupine position.  A total sleep time of only 145 minutes was recorded sleep efficiency was extremely poor at only 38.3% of the recorded night.  Chase [name] struggled with airway congestion sniffling and coughing all of which delayed his sleep onset and reduce the recorded sleep time.  I wrote that the apnea would be to mild and apparently is not excessively associated hypoxemia to require Pap treatment.  I requested neuropsychological evaluation, Dr. Etter Sjogren had seen the patient after the sleep test found that he had suffered from acute sinusitis and had begun treating him for this.  Soon after the treatment was initiated he felt better. He continues to use his CPAP at night as this helps him stay awake in daytime.  He had multiple mini-strokes in the past, and feels this therapy will protect him. He  needs a new machine - his is over 47 years old.  He has a lot of sinus disease. ENT has not seen  him - I like for him to see Dr. Wilburn Cornelia.   Auto titration machine will be ordered and he follow up with NP. I will order 5-15 cm water pressure, his preferred mask is a nasal mask, by [employer] in medium . He also had a dream wear nasal cradle mask, [facility].    Chief complaint according to patient : Chase [name] states that he is having increase in daytime sleepiness, memory difficulties and headaches. He is a current CPAP user, this past June has been 5 years with current CPAP machine. DM is worse, aphasia increased, headaches are bad and back. Last sleep study completed here on 11/17/2012.  I have the pleasure of meeting today with Chase [name], and meanwhile 58 year old Caucasian right-handed gentleman primary care patient of Dr. [name].  Over 5 years ago the patient was referred by Dr. Drusilla Kanner, at the time at [facility].  He was 58 years old, had suffered several strokes, suffered from headaches, GERD, osteoarthritis, respiratory allergies, uncontrolled diabetes and hypertension hyperlipidemia, upper airway resistance the syndrome and mild obstructive sleep apnea had been already diagnosed in 2008, at the time by Dr. Danton Sewer.  He reported trouble sleeping through the night, excessive daytime sleepiness and pain at the jaw which made it difficult to sleep tolerate certain headgear's.  He was known to snore and he  woke up this morning headaches.  His Epworth Sleepiness Scale had been endorsed at 17 points his AHI was 74.9, REM sleep had not been observed.  The study was split and CPAP titrated to 7 cmH2O REM sleep was still not seen, only 10 central apneas remained all obstructive apneas have been covered.  7 cm water pressure seemed to be partially effective.  The patient also brought to his compliance record here today, in the last 30 days he has used the machine 27 out of 30 days and  18 of those days over 4 hours, average use of time is just below 4 hours at 3 hours 50 minutes, CPAP is still set at 7 cm water pressure with 3 cm EPR, residual AHI is 4.7.  Central apneas occupied 3.8/h obstructive apneas 0.4/h there is actually no major air leak noted.  The current interface seems to work well for him.  He had a sudden rise in apnea since 30 January 2018- he is not sure if this would correlate with a new mask he just received ( August 9 th ).  Sleep habits are as follows: Dinner time is between 5- 9 PM, a wide range. Watches Tv after dinner, multiple hours. Goes to the bedroom between 11 PMand 8 AM ( yes, 9 hours) and is most nights asleep promptly- he sleeps on his back and sides, one pillow. Bedroom is cool and quiet , not all dark.  He uses electronics in the bedroom, he has his smart phone and grabs it when he goes to the bathroom. Nocturia 1-3 times.  He sleeps a total of about 7 hours. When he wakes up he reports headaches, sinus headaches, of a quality that he described as dull, throbbing and generalized- forehead, top of the head, an he feels sinus pressure, too.   He wakes up spontanously - cannot recall he last time he felt refreshed, restored.   REVIEW OF SYSTEMS: Full 14 system review of systems performed and notable only for those listed, all others are neg:  Constitutional: neg  Cardiovascular: neg Ear/Nose/Throat: neg  Skin: neg Eyes: neg Respiratory: neg Gastroitestinal: neg  Hematology/Lymphatic: neg  Endocrine: neg Musculoskeletal:neg Allergy/Immunology: Environmental allergies Neurological: neg Psychiatric: neg Sleep : Obstructive sleep apnea with CPAP-year-old   ALLERGIES: Allergies  Allergen Reactions  . Amoxicillin Hives, Itching and Other (See Comments)    White tongue; ? hives  . Fluticasone     Other reaction(s): Other Sores inside nose  . Sulfa Antibiotics Rash  . Flonase [Fluticasone Propionate]     Nasal Sores  . Terfenadine     ?  reaction    HOME MEDICATIONS: Outpatient Medications Prior to Visit  Medication Sig Dispense Refill  . allopurinol (ZYLOPRIM) 100 MG tablet Take 1 tablet (100 mg total) by mouth daily. 90 tablet 1  . amLODipine (NORVASC) 5 MG tablet Take 1 tablet (5 mg total) by mouth daily. 30 tablet 5  . Azelastine HCl 0.15 % SOLN PLACE 2 SPRAYS IN EACH NOSTRIL every night 30 mL 11  . azithromycin (ZITHROMAX Z-PAK) 250 MG tablet As directed 6 each 0  . BAYER MICROLET LANCETS lancets Use 1x a day 100 each 4  . betamethasone dipropionate (DIPROLENE) 0.05 % ointment Apply topically 2 (two) times daily. 30 g 0  . cephALEXin (KEFLEX) 250 MG/5ML suspension Take 10 mLs (500 mg total) by mouth 3 (three) times daily. 200 mL 0  . cyclobenzaprine (FLEXERIL) 10 MG tablet TAKE 1 TABLET BY MOUTH AT BEDTIME AS  NEEDED FOR MUSCLE SPASMS 30 tablet 1  . diphenhydrAMINE (BENADRYL) 25 MG tablet     . empagliflozin (JARDIANCE) 25 MG TABS tablet Take 25 mg by mouth daily. 90 tablet 3  . famotidine (PEPCID) 40 MG/5ML suspension Take 2.5 mLs (20 mg total) by mouth daily. 73 mL 5  . FIBER SELECT GUMMIES PO Take 1 tablet by mouth daily.    Marland Kitchen gabapentin (NEURONTIN) 100 MG capsule TAKE 1 CAPSULE BY MOUTH THREE TIMES A DAY 90 capsule 3  . gentamicin cream (GARAMYCIN) 0.1 % Apply 1 application topically 3 (three) times daily. 30 g 1  . glucose blood test strip Use 1x a day - E11.51, E11.65 Dispense Contour Next Strips 100 each 3  . guaiFENesin (MUCINEX) 600 MG 12 hr tablet Take 1 tablet (600 mg total) by mouth 2 (two) times daily. 60 tablet 1  . GuaiFENesin 200 MG/10ML SOLN     . HYDROcodone-acetaminophen (HYCET) 7.5-325 mg/15 ml solution TAKE 20 ML EVERY 6 HOURS AS NEEDED FOR PAIN 473 mL 0  . hydrOXYzine (ATARAX/VISTARIL) 25 MG tablet TAKE 1 TABLET BY MOUTH EVERY 8 HOURS AS NEEDED FOR ANXIETY OR ITCHING. 90 tablet 0  . ibuprofen (ADVIL) 200 MG tablet     . JANUVIA 100 MG tablet TAKE 1 TABLET BY MOUTH EVERY DAY 30 tablet 2  .  loratadine (CLARITIN) 10 MG tablet Take 10 mg by mouth daily.    . metoprolol tartrate (LOPRESSOR) 25 MG tablet TAKE 1 TABLET BY MOUTH TWICE A DAY -MAY CRUSH IN FOOD 180 tablet 1  . mometasone (ELOCON) 0.1 % ointment APPLY TO AFFECTED AREA EVERY DAY AS NEEDED FOR RASH 45 g 0  . Multiple Vitamins-Minerals (AIRBORNE GUMMIES PO) Take 1 each by mouth daily.    . NONFORMULARY OR COMPOUNDED ITEM Compression socks  #1   15-20 mm/hg  Dx edema 1 each 1  . ondansetron (ZOFRAN-ODT) 4 MG disintegrating tablet USE UP TO THREE TIMES A DAY AS NEEDED FOR NAUSEA. 10 tablet 0  . polyethylene glycol powder (GLYCOLAX/MIRALAX) powder TAKE 17 GRAMS BY MOUTH 2 TIMES DAILY AS NEEDED. 527 g 5  . pravastatin (PRAVACHOL) 20 MG tablet TAKE 1 TABLET BY MOUTH EVERY DAY 90 tablet 1  . PROCTOFOAM HC rectal foam APPLY FOAM THREE TIMES A DAY AS NEEDED FOR 7 DAYS 10 g 10  . terazosin (HYTRIN) 5 MG capsule Take by mouth daily.  3  . triamcinolone (NASACORT) 55 MCG/ACT AERO nasal inhaler Place 2 sprays into the nose daily. 1 Inhaler 12  . Triamcinolone Acetonide (NASACORT ALLERGY 24HR NA) Place into the nose.    . triamcinolone cream (KENALOG) 0.1 % APPLY TO AFFECTED AREA TWICE A DAY 30 g 0  . venlafaxine XR (EFFEXOR-XR) 75 MG 24 hr capsule TAKE 1 CAPSULE (75 MG TOTAL) BY MOUTH DAILY WITH BREAKFAST. 90 capsule 0   No facility-administered medications prior to visit.     PAST MEDICAL HISTORY: Past Medical History:  Diagnosis Date  . Allergy   . Arthritis   . Blood in stool   . Brain cyst   . Chest pain   . Constipation   . Diabetes mellitus without complication ([place])   . Difficulty urinating   . GERD (gastroesophageal reflux disease)   . Headache(784.0)   . Hearing loss   . Hyperlipidemia   . Hypertension   . Leg swelling   . Nasal congestion   . PONV (postoperative nausea and vomiting)   . Rectal pain   . Rectal  ulcer with bleeding after hemorrhoid banding 02/11/2015  . Sleep apnea   . Stroke ([place])   . Stroke  ([place])   . Thrombosed hemorrhoids   . Trouble swallowing     PAST SURGICAL HISTORY: Past Surgical History:  Procedure Laterality Date  . CATARACT EXTRACTION     x 3  . [place]   cataracts  . FLEXIBLE SIGMOIDOSCOPY N/A 02/11/2015   Procedure: FLEXIBLE SIGMOIDOSCOPY;  Surgeon: Gatha Mayer, MD;  Location: [facility];  Service: Endoscopy;  Laterality: N/A;  . INNER EAR SURGERY     rt ear  . INNER EAR SURGERY  1992  . surgery - right arm     1983    FAMILY HISTORY: Family History  Problem Relation Age of Onset  . Breast cancer Mother 19       breast  . Huntington's disease Mother   . Stroke Father   . Heart disease Father   . Hypertension Father   . Heart attack Father   . Hypertension Brother   . Hyperlipidemia Neg Hx   . Diabetes Neg Hx     SOCIAL HISTORY: Social History   Socioeconomic History  . Marital status: Single    Spouse name: Not on file  . Number of children: 0  . Years of education: Not on file  . Highest education level: Not on file  Occupational History  . Not on file  Social Needs  . Financial resource strain: Not on file  . Food insecurity:    Worry: Not on file    Inability: Not on file  . Transportation needs:    Medical: Not on file    Non-medical: Not on file  Tobacco Use  . Smoking status: Never Smoker  . Smokeless tobacco: Never Used  Substance and Sexual Activity  . Alcohol use: No    Alcohol/week: 0.0 standard drinks    Comment: none  . Drug use: No  . Sexual activity: Never  Lifestyle  . Physical activity:    Days per week: Not on file    Minutes per session: Not on file  . Stress: Not on file  Relationships  . Social connections:    Talks on phone: Not on file    Gets together: Not on file    Attends religious service: Not on file    Active member of club or organization: Not on file    Attends meetings of clubs or organizations: Not on file    Relationship status: Not on file  . Intimate  partner violence:    Fear of current or ex partner: Not on file    Emotionally abused: Not on file    Physically abused: Not on file    Forced sexual activity: Not on file  Other Topics Concern  . Not on file  Social History Narrative   No caffeine intake except for chocolate.  Exercised-walking     PHYSICAL EXAM  Vitals:   08/04/18 1503  BP: 132/80  Pulse: 90  SpO2: 96%  Weight: 194 lb 12.8 oz (88.4 kg)  Height: 5\' 10"  (1.778 m)   Body mass index is 27.95 kg/m.  Generalized: Well developed, in no acute distress  Head: normocephalic and atraumatic,. Oropharynx benign mallopatti 5 Neck: Supple, circumference 15.5 Lungs: Clear Musculoskeletal: No deformity  Skin no rash or edema Neurological examination   Mentation: Alert oriented to time, place, history taking. Attention span and concentration appropriate. Recent and remote memory intact.  Follows all commands speech and language fluent.   Cranial nerve II-XII: .Pupils were equal round reactive to light extraocular movements were full, visual field were full on confrontational test. Facial sensation and strength were normal. hearing was intact to finger rubbing bilaterally. Uvula tongue midline. head turning and shoulder shrug were normal and symmetric.Tongue protrusion into cheek strength was normal. Motor: normal bulk and tone, full strength in the BUE, BLE, Sensory: normal and symmetric to light touch,  Coordination: finger-nose-finger, no dysmetria Gait and Station: Rising up from seated position without assistance, normal stance, no difficulty with turns no assistive device DIAGNOSTIC DATA (LABS, IMAGING, TESTING) - I reviewed patient records, labs, notes, testing and imaging myself where available.  Lab Results  Component Value Date   WBC 9.5 12/10/2017   HGB 14.1 12/10/2017   HCT 41.9 12/10/2017   MCV 87.4 12/10/2017   PLT 350.0 12/10/2017      Component Value Date/Time   NA 143 12/10/2017 1701   K 4.7  12/10/2017 1701   CL 104 12/10/2017 1701   CO2 31 12/10/2017 1701   GLUCOSE 142 (H) 12/10/2017 1701   BUN 12 12/10/2017 1701   CREATININE 1.27 12/10/2017 1701   CREATININE 1.46 (H) 11/18/2013 1509   CALCIUM 10.0 12/10/2017 1701   PROT 7.0 12/10/2017 1701   ALBUMIN 4.5 12/10/2017 1701   AST 15 12/10/2017 1701   ALT 17 12/10/2017 1701   ALKPHOS 85 12/10/2017 1701   BILITOT 0.4 12/10/2017 1701   GFRNONAA >60 02/12/2015 0408   GFRAA >60 02/12/2015 0408   Lab Results  Component Value Date   CHOL 158 12/10/2017   HDL 30.00 (L) 12/10/2017   LDLCALC 102 (H) 08/04/2017   LDLDIRECT 80.0 12/10/2017   TRIG 260.0 (H) 12/10/2017   CHOLHDL 5 12/10/2017   Lab Results  Component Value Date   HGBA1C 7.9 (A) 08/04/2018   Lab Results  Component Value Date   VITAMINB12 672 02/10/2015   Lab Results  Component Value Date   TSH 2.89 02/19/2017      ASSESSMENT AND PLAN  58 y.o. year old male  has a past medical history of  Diabetes mellitus without complication ([place]), Difficulty urinating, GERD (gastroesophageal reflux disease), Headache(784.0), Hearing loss, Hyperlipidemia, Hypertension,  Sleep apnea, Stroke ([place]), Thrombosed hemorrhoids, and Trouble swallowing. here to follow-up for obstructive sleep apnea with CPAP compliance. Data dated 07/04/2018-08/02/2018 shows compliance greater than 4 hours at 97%.  Average usage 6 hours 8 minutes.  Set pressure 5 to 15 cm.  EPR level 3.  Leak 95th percentile 15.9.  AHI 6.2   PLAN: CPAP compliance 97% reviewed data with patient Continue same settings Follow-up yearly and as needed Dennie Bible, [facility], [facility], APRN  [facility] [address] [place], [place] [address] [phone]

## 2018-08-04 ENCOUNTER — Encounter: Payer: Self-pay | Admitting: Nurse Practitioner

## 2018-08-04 ENCOUNTER — Ambulatory Visit (INDEPENDENT_AMBULATORY_CARE_PROVIDER_SITE_OTHER): Payer: BLUE CROSS/BLUE SHIELD | Admitting: Nurse Practitioner

## 2018-08-04 ENCOUNTER — Other Ambulatory Visit: Payer: Self-pay

## 2018-08-04 ENCOUNTER — Ambulatory Visit (INDEPENDENT_AMBULATORY_CARE_PROVIDER_SITE_OTHER): Payer: BLUE CROSS/BLUE SHIELD | Admitting: Internal Medicine

## 2018-08-04 ENCOUNTER — Encounter: Payer: Self-pay | Admitting: Internal Medicine

## 2018-08-04 VITALS — BP 148/90 | HR 87 | Ht 70.0 in | Wt 194.0 lb

## 2018-08-04 VITALS — BP 132/80 | HR 90 | Ht 70.0 in | Wt 194.8 lb

## 2018-08-04 DIAGNOSIS — G4730 Sleep apnea, unspecified: Secondary | ICD-10-CM | POA: Diagnosis not present

## 2018-08-04 DIAGNOSIS — E1151 Type 2 diabetes mellitus with diabetic peripheral angiopathy without gangrene: Secondary | ICD-10-CM

## 2018-08-04 DIAGNOSIS — E1165 Type 2 diabetes mellitus with hyperglycemia: Secondary | ICD-10-CM

## 2018-08-04 DIAGNOSIS — E785 Hyperlipidemia, unspecified: Secondary | ICD-10-CM | POA: Diagnosis not present

## 2018-08-04 DIAGNOSIS — IMO0002 Reserved for concepts with insufficient information to code with codable children: Secondary | ICD-10-CM

## 2018-08-04 LAB — POCT GLYCOSYLATED HEMOGLOBIN (HGB A1C): Hemoglobin A1C: 7.9 % — AB (ref 4.0–5.6)

## 2018-08-04 NOTE — Patient Instructions (Signed)
Please continue: - Jardiance 25 mg in am - Januvia 100 mg in am  Check sugars once a day, rotating check times.  Stop juice and regular sodas!  Please come back for a follow-up appointment in 3-4 months.

## 2018-08-04 NOTE — Progress Notes (Signed)
Patient ID: Chase [name], male   DOB: August 26, 1960, 58 y.o.   MRN: [id number]  HPI: Chase [name] is a 58 y.o.-year-old male, returning for follow-up for DM2, dx in 2014, non-insulin-dependent, uncontrolled, with complications (PVD, cerebro-vascular ds - s/p "mini"strokes, PN). Last visit 7 months ago.  Last hemoglobin A1c was: Lab Results  Component Value Date   HGBA1C 7.5 (A) 12/21/2017   HGBA1C 7.4 09/17/2017   HGBA1C 7.8 06/19/2017   Pt is on a regimen of: - Jardiance 25 mg daily in am - started 04/2016 - he was off Jardiance for 2-3 mo over the Holidays >> restarted 07/2018  - Januvia 100 mg daily in am - started 10/2015 Stopped Glipizide b/c rash. Stopped Invokana 100 mg in am >> only got this for 1 mo He was on Metformin 500 mg po bid - in liquid form, as he could not swallow pills. He gets gi upset. He stopped this in 2014.  Pt was checking his sugars frequently with his Freestyle libre CGM >> he is now off as this was not covered anymore for him.  He is checking sugars 0-1x a day - no log, no meter, cannot remember values  - has Raynaud's sd. - fingersticks not accurate: - brunch:   130-150 >> 153, 228 >> ? - 2h after brunch:  312 >> 150-220 >> n/c - before dinner: 216 >> 150-200 >> n/c - 2h after dinner:  180-210 >> 260 >> n/c - bedtime: 172-214 >> 208 >> ? - nighttime: n/c Lowest sugar was 120 >> 153 >> 100s,  he has hypoglycemia awareness in the 70s. Highest sugar was 300s (at night) >> 260 >> "almost 300".  Glucometer: [employer] Next  At last visit, he was drinking lemonade and ginger ale and I strongly advised him to stop.  He still drinks these every once in a while.  -+ Mild CKD, last BUN/creatinine:  Lab Results  Component Value Date   BUN 12 12/10/2017   BUN 17 08/04/2017   CREATININE 1.27 12/10/2017   CREATININE 1.09 08/04/2017   -+ HL.  last set of lipids: Lab Results  Component Value Date   CHOL 158 12/10/2017   HDL 30.00 (L) 12/10/2017   LDLCALC 102 (H) 08/04/2017   LDLDIRECT 80.0 12/10/2017   TRIG 260.0 (H) 12/10/2017   CHOLHDL 5 12/10/2017  On pravastatin 20. - last eye exam was in 02/2016: No DR. Did not schedule a new appt despite multiple promptings. 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 He has anxiety, PTSD from childhood. Has varicose veins >> wears compresion hoses. He also has a h/o kidney stones.  On allopurinol for gout.  Constitutional: no weight gain/no weight loss, no fatigue, no subjective hyperthermia, no subjective hypothermia Eyes: no blurry vision, no xerophthalmia ENT: no sore throat, no nodules palpated in neck, no dysphagia, no odynophagia, no hoarseness Cardiovascular: no CP/no SOB/no palpitations/no leg swelling Respiratory: no cough/no SOB/no wheezing Gastrointestinal: no N/no V/no D/no C/no acid reflux Musculoskeletal: no muscle aches/no joint aches Skin: no rashes, no hair loss Neurological: no tremors/+numbness/+ tingling/no dizziness  I reviewed pt's medications, allergies, PMH, social hx, family hx, and changes were documented in the history of present illness. Otherwise, unchanged from my initial visit note.  Past Medical History:  Diagnosis Date  . Allergy   . Arthritis   . Blood in  stool   . Brain cyst   . Chest pain   . Constipation   . Diabetes mellitus without complication ([place])   . Difficulty urinating   . GERD (gastroesophageal reflux disease)   . Headache(784.0)   . Hearing loss   . Hyperlipidemia   . Hypertension   . Leg swelling   . Nasal congestion   . PONV (postoperative nausea and vomiting)   . Rectal pain   . Rectal ulcer with bleeding after hemorrhoid banding 02/11/2015  . Sleep apnea   . Stroke ([place])   . Stroke ([place])   . Thrombosed hemorrhoids   . Trouble swallowing    Past Surgical History:  Procedure Laterality Date  . CATARACT EXTRACTION      x 3  . [place]   cataracts  . FLEXIBLE SIGMOIDOSCOPY N/A 02/11/2015   Procedure: FLEXIBLE SIGMOIDOSCOPY;  Surgeon: Gatha Mayer, MD;  Location: [facility];  Service: Endoscopy;  Laterality: N/A;  . INNER EAR SURGERY     rt ear  . INNER EAR SURGERY  1992  . surgery - right arm     1983   Social History   Socioeconomic History  . Marital status: Single    Spouse name: Not on file  . Number of children: 0  . Years of education: Not on file  . Highest education level: Not on file  Occupational History  . Not on file  Social Needs  . Financial resource strain: Not on file  . Food insecurity:    Worry: Not on file    Inability: Not on file  . Transportation needs:    Medical: Not on file    Non-medical: Not on file  Tobacco Use  . Smoking status: Never Smoker  . Smokeless tobacco: Never Used  Substance and Sexual Activity  . Alcohol use: No    Alcohol/week: 0.0 standard drinks    Comment: none  . Drug use: No  . Sexual activity: Never  Lifestyle  . Physical activity:    Days per week: Not on file    Minutes per session: Not on file  . Stress: Not on file  Relationships  . Social connections:    Talks on phone: Not on file    Gets together: Not on file    Attends religious service: Not on file    Active member of club or organization: Not on file    Attends meetings of clubs or organizations: Not on file    Relationship status: Not on file  . Intimate partner violence:    Fear of current or ex partner: Not on file    Emotionally abused: Not on file    Physically abused: Not on file    Forced sexual activity: Not on file  Other Topics Concern  . Not on file  Social History Narrative   No caffeine intake except for chocolate.  Exercised-walking   Current Outpatient Medications on File Prior to Visit  Medication Sig Dispense Refill  . allopurinol (ZYLOPRIM) 100 MG tablet Take 1 tablet (100 mg total) by mouth daily. 90 tablet 1  .  amLODipine (NORVASC) 5 MG tablet Take 1 tablet (5 mg total) by mouth daily. 30 tablet 5  . Azelastine HCl 0.15 % SOLN PLACE 2 SPRAYS IN EACH NOSTRIL every night 30 mL 11  . azithromycin (ZITHROMAX Z-PAK) 250 MG tablet As directed 6 each 0  . BAYER MICROLET LANCETS lancets Use 1x a day 100 each 4  .  betamethasone dipropionate (DIPROLENE) 0.05 % ointment Apply topically 2 (two) times daily. 30 g 0  . cephALEXin (KEFLEX) 250 MG/5ML suspension Take 10 mLs (500 mg total) by mouth 3 (three) times daily. 200 mL 0  . cyclobenzaprine (FLEXERIL) 10 MG tablet TAKE 1 TABLET BY MOUTH AT BEDTIME AS NEEDED FOR MUSCLE SPASMS 30 tablet 1  . diphenhydrAMINE (BENADRYL) 25 MG tablet     . empagliflozin (JARDIANCE) 25 MG TABS tablet Take 25 mg by mouth daily. 90 tablet 3  . famotidine (PEPCID) 40 MG/5ML suspension Take 2.5 mLs (20 mg total) by mouth daily. 73 mL 5  . FIBER SELECT GUMMIES PO Take 1 tablet by mouth daily.    Marland Kitchen gabapentin (NEURONTIN) 100 MG capsule TAKE 1 CAPSULE BY MOUTH THREE TIMES A DAY 90 capsule 3  . gentamicin cream (GARAMYCIN) 0.1 % Apply 1 application topically 3 (three) times daily. 30 g 1  . glucose blood test strip Use 1x a day - E11.51, E11.65 Dispense Contour Next Strips 100 each 3  . guaiFENesin (MUCINEX) 600 MG 12 hr tablet Take 1 tablet (600 mg total) by mouth 2 (two) times daily. 60 tablet 1  . GuaiFENesin 200 MG/10ML SOLN     . HYDROcodone-acetaminophen (HYCET) 7.5-325 mg/15 ml solution TAKE 20 ML EVERY 6 HOURS AS NEEDED FOR PAIN 473 mL 0  . hydrOXYzine (ATARAX/VISTARIL) 25 MG tablet TAKE 1 TABLET BY MOUTH EVERY 8 HOURS AS NEEDED FOR ANXIETY OR ITCHING. 90 tablet 0  . ibuprofen (ADVIL) 200 MG tablet     . JANUVIA 100 MG tablet TAKE 1 TABLET BY MOUTH EVERY DAY 30 tablet 2  . loratadine (CLARITIN) 10 MG tablet Take 10 mg by mouth daily.    . metoprolol tartrate (LOPRESSOR) 25 MG tablet TAKE 1 TABLET BY MOUTH TWICE A DAY -MAY CRUSH IN FOOD 180 tablet 1  . mometasone (ELOCON) 0.1 %  ointment APPLY TO AFFECTED AREA EVERY DAY AS NEEDED FOR RASH 45 g 0  . Multiple Vitamins-Minerals (AIRBORNE GUMMIES PO) Take 1 each by mouth daily.    . NONFORMULARY OR COMPOUNDED ITEM Compression socks  #1   15-20 mm/hg  Dx edema 1 each 1  . ondansetron (ZOFRAN-ODT) 4 MG disintegrating tablet USE UP TO THREE TIMES A DAY AS NEEDED FOR NAUSEA. 10 tablet 0  . polyethylene glycol powder (GLYCOLAX/MIRALAX) powder TAKE 17 GRAMS BY MOUTH 2 TIMES DAILY AS NEEDED. 527 g 5  . pravastatin (PRAVACHOL) 20 MG tablet TAKE 1 TABLET BY MOUTH EVERY DAY 90 tablet 1  . PROCTOFOAM HC rectal foam APPLY FOAM THREE TIMES A DAY AS NEEDED FOR 7 DAYS 10 g 10  . terazosin (HYTRIN) 5 MG capsule Take by mouth daily.  3  . triamcinolone (NASACORT) 55 MCG/ACT AERO nasal inhaler Place 2 sprays into the nose daily. 1 Inhaler 12  . Triamcinolone Acetonide (NASACORT ALLERGY 24HR NA) Place into the nose.    . triamcinolone cream (KENALOG) 0.1 % APPLY TO AFFECTED AREA TWICE A DAY 30 g 0  . venlafaxine XR (EFFEXOR-XR) 75 MG 24 hr capsule TAKE 1 CAPSULE (75 MG TOTAL) BY MOUTH DAILY WITH BREAKFAST. 90 capsule 0   No current facility-administered medications on file prior to visit.    Allergies  Allergen Reactions  . Amoxicillin Hives, Itching and Other (See Comments)    White tongue; ? hives  . Fluticasone     Other reaction(s): Other Sores inside nose  . Sulfa Antibiotics Rash  . Flonase [Fluticasone Propionate]  Nasal Sores  . Terfenadine     ? reaction   Family History  Problem Relation Age of Onset  . Breast cancer Mother 61       breast  . Huntington's disease Mother   . Stroke Father   . Heart disease Father   . Hypertension Father   . Heart attack Father   . Hypertension Brother   . Hyperlipidemia Neg Hx   . Diabetes Neg Hx    PE: BP (!) 148/90   Pulse 87   Ht 5\' 10"  (1.778 m) Comment: measured  Wt 194 lb (88 kg)   SpO2 97%   BMI 27.84 kg/m  Wt Readings from Last 3 Encounters:  08/04/18 194 lb  (88 kg)  05/10/18 194 lb (88 kg)  03/12/18 191 lb (86.6 kg)   Constitutional: overweight, in NAD Eyes: + Right surgical pupil, EOMI, no exophthalmos ENT: moist mucous membranes, no thyromegaly, no cervical lymphadenopathy Cardiovascular: RRR, No MRG Respiratory: CTA B Gastrointestinal: abdomen soft, NT, ND, BS+ Musculoskeletal: no deformities, strength intact in all 4 Skin: moist, warm, no rashes Neurological: no tremor with outstretched hands, DTR normal in all 4  ASSESSMENT: 1. DM2, non-insulin-dependent, uncontrolled, with complications - PVD - cerebro-vascular ds - s/p "mini"strokes - PN  2. HL  PLAN:  1. Patient with history of uncontrolled type 2 diabetes, on Januvia and Jardiance.  He had deteriorating control in the past when he had to be off 1 of the other of the 2 medicines due to insurance coverage.  We always have to call the insurance to get his medications approved, after several communications back-and-forth.  Most recent approval was for Jardiance in 06/2018. -At last visit, sugars were higher and the HbA1c was also higher at 7.5%.  At that time, discussed about checking sugars consistently once a day, rotating check times, as he was not doing so.  I also advised him then and again today to stop juice and regular sodas and stay with water infused with fruit, mineral water, or some almond milk.  I also advised him against artificial sweeteners.  I did refer him to nutrition but he did not go yet. -At this visit, sugars are higher over the holidays as he was out of Jardiance.  We got this approved for him so he restarted it and sugars are coming down.  Unfortunately, he does not bring a log or meter and cannot remember the values well.  We will not change his regimen for now.  However, as he has Raynolds, we will try to submit a preauthorization for freestyle libre CGM to his insurance since his fingersticks are not accurate due to this condition. - I suggested to:  Patient  Instructions  Please continue: - Jardiance 25 mg in am - Januvia 100 mg in am  Check sugars once a day, rotating check times.  Stop juice and regular sodas!  Please come back for a follow-up appointment in 3-4 months.  - today, HbA1c is 7.9% (higher) - continue checking sugars at different times of the day - check 1x a day, rotating checks - advised for yearly eye exams >> he is not UTD - Return to clinic in 3-4 mo with sugar log      2. HL - Reviewed latest lipid panel from 11/2017: LDL improved, now lower than 100, HDL decreased, triglycerides again high Lab Results  Component Value Date   CHOL 158 12/10/2017   HDL 30.00 (L) 12/10/2017   LDLCALC 102 (H) 08/04/2017  LDLDIRECT 80.0 12/10/2017   TRIG 260.0 (H) 12/10/2017   CHOLHDL 5 12/10/2017  - Continues Lipitor without side effects.  Philemon Kingdom, MD PhD [facility] Endocrinology

## 2018-08-04 NOTE — Patient Instructions (Signed)
CPAP compliance 97% Continue same settings Follow-up yearly and as needed

## 2018-08-04 NOTE — Addendum Note (Signed)
Addended by: Cardell Peach I on: 08/04/2018 11:01 AM   Modules accepted: Orders

## 2018-08-06 ENCOUNTER — Other Ambulatory Visit: Payer: Self-pay

## 2018-08-06 MED ORDER — FREESTYLE LIBRE 14 DAY SENSOR MISC: 1.0000 | 2 refills | Status: DC

## 2018-08-06 MED ORDER — FREESTYLE LIBRE 14 DAY READER DEVI: 1.0000 | 0 refills | Status: DC

## 2018-08-10 ENCOUNTER — Encounter: Payer: Self-pay | Admitting: Internal Medicine

## 2018-08-10 ENCOUNTER — Telehealth: Payer: Self-pay

## 2018-08-10 NOTE — Telephone Encounter (Signed)
PA for Libre CGM denied by insurance because patient is not on insulin.

## 2018-08-15 DIAGNOSIS — G4733 Obstructive sleep apnea (adult) (pediatric): Secondary | ICD-10-CM | POA: Diagnosis not present

## 2018-08-17 ENCOUNTER — Other Ambulatory Visit: Payer: Self-pay | Admitting: Podiatry

## 2018-08-17 DIAGNOSIS — G8929 Other chronic pain: Secondary | ICD-10-CM

## 2018-08-18 MED ORDER — HYDROCODONE-ACETAMINOPHEN 7.5-325 MG/15ML PO SOLN: ORAL | 0 refills | Status: DC

## 2018-08-20 ENCOUNTER — Encounter: Payer: Self-pay | Admitting: Podiatry

## 2018-08-23 ENCOUNTER — Other Ambulatory Visit: Payer: Self-pay | Admitting: Podiatry

## 2018-08-23 ENCOUNTER — Telehealth: Payer: Self-pay | Admitting: Podiatry

## 2018-08-23 DIAGNOSIS — G8929 Other chronic pain: Secondary | ICD-10-CM

## 2018-08-23 MED ORDER — HYDROCODONE-ACETAMINOPHEN 7.5-325 MG/15ML PO SOLN: ORAL | 0 refills | Status: DC

## 2018-08-23 NOTE — Progress Notes (Signed)
PRN pain 

## 2018-08-23 NOTE — Telephone Encounter (Signed)
[facility] [address] states they are unable to order Hycet, send the rx to [place] and he will cancel their order.

## 2018-08-23 NOTE — Telephone Encounter (Signed)
Pharmacy called stating they do not have the hydrocodone and are unable to order it. Pharmacy stating that we will need to send it to another pharmacy.

## 2018-08-25 NOTE — Telephone Encounter (Signed)
Sent on 3/9.

## 2018-08-31 DIAGNOSIS — G4733 Obstructive sleep apnea (adult) (pediatric): Secondary | ICD-10-CM | POA: Diagnosis not present

## 2018-09-09 ENCOUNTER — Other Ambulatory Visit: Payer: Self-pay | Admitting: Podiatry

## 2018-09-09 ENCOUNTER — Other Ambulatory Visit: Payer: Self-pay | Admitting: Internal Medicine

## 2018-09-09 DIAGNOSIS — E1151 Type 2 diabetes mellitus with diabetic peripheral angiopathy without gangrene: Secondary | ICD-10-CM

## 2018-09-09 DIAGNOSIS — G8929 Other chronic pain: Secondary | ICD-10-CM

## 2018-09-09 DIAGNOSIS — IMO0002 Reserved for concepts with insufficient information to code with codable children: Secondary | ICD-10-CM

## 2018-09-09 DIAGNOSIS — E1165 Type 2 diabetes mellitus with hyperglycemia: Secondary | ICD-10-CM

## 2018-09-11 ENCOUNTER — Other Ambulatory Visit: Payer: Self-pay | Admitting: Podiatry

## 2018-09-11 DIAGNOSIS — G8929 Other chronic pain: Secondary | ICD-10-CM

## 2018-09-13 ENCOUNTER — Encounter: Payer: Self-pay | Admitting: Family Medicine

## 2018-09-13 ENCOUNTER — Other Ambulatory Visit: Payer: Self-pay | Admitting: *Deleted

## 2018-09-13 DIAGNOSIS — J302 Other seasonal allergic rhinitis: Secondary | ICD-10-CM

## 2018-09-13 DIAGNOSIS — I10 Essential (primary) hypertension: Secondary | ICD-10-CM

## 2018-09-13 MED ORDER — AMLODIPINE BESYLATE 5 MG PO TABS: 5.0000 mg | ORAL_TABLET | Freq: Every day | ORAL | 1 refills | Status: DC

## 2018-09-13 MED ORDER — HYDROXYZINE HCL 25 MG PO TABS: ORAL_TABLET | ORAL | 1 refills | Status: DC

## 2018-09-13 MED ORDER — AZELASTINE HCL 0.15 % NA SOLN: NASAL | 11 refills | Status: DC

## 2018-09-13 NOTE — Telephone Encounter (Signed)
Pt states the area looks like there is a darkness under the nail, but doesn't hurt. I told pt that if he wasn't having any pain then there would be no need to have the liquid Hycet refilled. Pt states that doesn't make sense because he was informed it was denied. I told him that we were continuing to get a refill request for the Hycet as if he were requesting.

## 2018-09-16 ENCOUNTER — Other Ambulatory Visit: Payer: Self-pay | Admitting: Family Medicine

## 2018-09-16 DIAGNOSIS — G8929 Other chronic pain: Secondary | ICD-10-CM

## 2018-09-16 MED ORDER — HYDROCODONE-ACETAMINOPHEN 7.5-325 MG/15ML PO SOLN: ORAL | 0 refills | Status: DC

## 2018-09-16 NOTE — Telephone Encounter (Signed)
I refilled but we need uds, contract and ov q64m

## 2018-10-03 ENCOUNTER — Other Ambulatory Visit: Payer: Self-pay | Admitting: Family Medicine

## 2018-10-03 DIAGNOSIS — M25511 Pain in right shoulder: Secondary | ICD-10-CM

## 2018-10-19 ENCOUNTER — Other Ambulatory Visit: Payer: Self-pay | Admitting: Family Medicine

## 2018-10-19 DIAGNOSIS — G8929 Other chronic pain: Secondary | ICD-10-CM

## 2018-10-20 ENCOUNTER — Other Ambulatory Visit: Payer: Self-pay | Admitting: Family Medicine

## 2018-10-20 ENCOUNTER — Telehealth: Payer: Self-pay

## 2018-10-20 DIAGNOSIS — K219 Gastro-esophageal reflux disease without esophagitis: Secondary | ICD-10-CM

## 2018-10-20 DIAGNOSIS — F322 Major depressive disorder, single episode, severe without psychotic features: Secondary | ICD-10-CM

## 2018-10-20 NOTE — Telephone Encounter (Signed)
Please schedule patient appointment with pcp.

## 2018-10-20 NOTE — Telephone Encounter (Signed)
Left message for patient to call back to schedule follow up appointment.

## 2018-10-21 ENCOUNTER — Encounter: Payer: Self-pay | Admitting: Family Medicine

## 2018-10-21 ENCOUNTER — Ambulatory Visit (INDEPENDENT_AMBULATORY_CARE_PROVIDER_SITE_OTHER): Payer: BLUE CROSS/BLUE SHIELD | Admitting: Family Medicine

## 2018-10-21 DIAGNOSIS — M1A09X0 Idiopathic chronic gout, multiple sites, without tophus (tophi): Secondary | ICD-10-CM | POA: Diagnosis not present

## 2018-10-21 DIAGNOSIS — I4891 Unspecified atrial fibrillation: Secondary | ICD-10-CM | POA: Diagnosis not present

## 2018-10-21 DIAGNOSIS — K219 Gastro-esophageal reflux disease without esophagitis: Secondary | ICD-10-CM

## 2018-10-21 DIAGNOSIS — E785 Hyperlipidemia, unspecified: Secondary | ICD-10-CM

## 2018-10-21 DIAGNOSIS — E1165 Type 2 diabetes mellitus with hyperglycemia: Secondary | ICD-10-CM | POA: Diagnosis not present

## 2018-10-21 DIAGNOSIS — G8929 Other chronic pain: Secondary | ICD-10-CM

## 2018-10-21 DIAGNOSIS — F322 Major depressive disorder, single episode, severe without psychotic features: Secondary | ICD-10-CM | POA: Diagnosis not present

## 2018-10-21 DIAGNOSIS — E1169 Type 2 diabetes mellitus with other specified complication: Secondary | ICD-10-CM | POA: Diagnosis not present

## 2018-10-21 DIAGNOSIS — I10 Essential (primary) hypertension: Secondary | ICD-10-CM

## 2018-10-21 DIAGNOSIS — G4737 Central sleep apnea in conditions classified elsewhere: Secondary | ICD-10-CM | POA: Diagnosis not present

## 2018-10-21 NOTE — Progress Notes (Signed)
Virtual Visit via Video Note  I connected with Chase [name] on 10/21/18 at 10:00 AM EDT by a video enabled telemedicine application and verified that I am speaking with the correct person using two identifiers.  Location: Patient: home  Provider: home    I discussed the limitations of evaluation and management by telemedicine and the availability of in person appointments. The patient expressed understanding and agreed to proceed.  History of Present Illness: Pt is home with no complaints.   HYPERTENSION   Blood pressure range-120/76  Chest pain- no      Dyspnea- no Lightheadedness- no   Edema- no  Other side effects - no   Medication compliance: no Low salt diet- no    DIABETES    Blood Sugar ranges-177-351  Polyuria- no New Visual problems- no  Hypoglycemic symptoms- no  Other side effects-no Medication compliance - good Last eye exam- due   HYPERLIPIDEMIA  Medication compliance- good  RUQ pain- no  Muscle aches- no Other side effects-no   Review of Systems  Constitutional: Negative for activity change, appetite change and fatigue.  HENT: Negative for hearing loss, congestion, tinnitus and ear discharge.  dentist q36m Eyes: Negative for visual disturbance (see optho q1y - appt due) Respiratory: Negative for cough, chest tightness and shortness of breath.   Cardiovascular: Negative for chest pain, palpitations and leg swelling.  Gastrointestinal: Negative for abdominal pain, diarrhea, constipation and abdominal distention.  Genitourinary: Negative for urgency, frequency, decreased urine volume and difficulty urinating.  Musculoskeletal: Negative for back pain, arthralgias and gait problem.  Skin: Negative for color change, pallor and rash.  Neurological: Negative for dizziness, light-headedness, numbness and headaches.  Hematological: Negative for adenopathy. Does not bruise/bleed easily.  Psychiatric/Behavioral: Negative for suicidal ideas, , self-injury, dysphoric  mood, decreased concentration and agitation. + sleep problem  Past Medical History:  Diagnosis Date  . Allergy   . Arthritis   . Blood in stool   . Brain cyst   . Chest pain   . Constipation   . Diabetes mellitus without complication ([place])   . Difficulty urinating   . GERD (gastroesophageal reflux disease)   . Headache(784.0)   . Hearing loss   . Hyperlipidemia   . Hypertension   . Leg swelling   . Nasal congestion   . PONV (postoperative nausea and vomiting)   . Rectal pain   . Rectal ulcer with bleeding after hemorrhoid banding 02/11/2015  . Sleep apnea   . Stroke ([place])   . Stroke ([place])   . Thrombosed hemorrhoids   . Trouble swallowing    Current Outpatient Medications on File Prior to Visit  Medication Sig Dispense Refill  . allopurinol (ZYLOPRIM) 100 MG tablet Take 1 tablet (100 mg total) by mouth daily. 90 tablet 1  . amLODipine (NORVASC) 5 MG tablet Take 1 tablet (5 mg total) by mouth daily. 90 tablet 1  . Azelastine HCl 0.15 % SOLN PLACE 2 SPRAYS IN EACH NOSTRIL every night 30 mL 11  . azithromycin (ZITHROMAX Z-PAK) 250 MG tablet As directed 6 each 0  . BAYER MICROLET LANCETS lancets Use 1x a day 100 each 4  . betamethasone dipropionate (DIPROLENE) 0.05 % ointment Apply topically 2 (two) times daily. 30 g 0  . cephALEXin (KEFLEX) 250 MG/5ML suspension Take 10 mLs (500 mg total) by mouth 3 (three) times daily. 200 mL 0  . Continuous Blood Gluc Receiver (FREESTYLE LIBRE 14 DAY READER) DEVI 1 Device by Does not apply route every 14 (  fourteen) days. 1 Device 0  . Continuous Blood Gluc Sensor (FREESTYLE LIBRE 14 DAY SENSOR) MISC 1 each by Subdermal route every 14 (fourteen) days. 6 each 2  . cyclobenzaprine (FLEXERIL) 10 MG tablet TAKE 1 TABLET BY MOUTH AT BEDTIME AS NEEDED FOR MUSCLE SPASMS 30 tablet 1  . diphenhydrAMINE (BENADRYL) 25 MG tablet     . empagliflozin (JARDIANCE) 25 MG TABS tablet Take 25 mg by mouth daily. 90 tablet 3  . famotidine (PEPCID) 40 MG/5ML  suspension Take 2.5 mLs (20 mg total) by mouth daily. 73 mL 5  . FIBER SELECT GUMMIES PO Take 1 tablet by mouth daily.    Marland Kitchen gabapentin (NEURONTIN) 100 MG capsule TAKE 1 CAPSULE BY MOUTH THREE TIMES A DAY 90 capsule 3  . gentamicin cream (GARAMYCIN) 0.1 % Apply 1 application topically 3 (three) times daily. 30 g 1  . glucose blood test strip Use 1x a day - E11.51, E11.65 Dispense Contour Next Strips 100 each 3  . guaiFENesin (MUCINEX) 600 MG 12 hr tablet Take 1 tablet (600 mg total) by mouth 2 (two) times daily. 60 tablet 1  . GuaiFENesin 200 MG/10ML SOLN     . HYDROcodone-acetaminophen (HYCET) 7.5-325 mg/15 ml solution TAKE 20 ML EVERY 6 HOURS AS NEEDED FOR PAIN 473 mL 0  . hydrOXYzine (ATARAX/VISTARIL) 25 MG tablet Take 1 tablet by mouth every 8 hours as needed for anxiety or itching 90 tablet 1  . ibuprofen (ADVIL) 200 MG tablet     . JANUVIA 100 MG tablet TAKE 1 TABLET BY MOUTH EVERY DAY 30 tablet 2  . loratadine (CLARITIN) 10 MG tablet Take 10 mg by mouth daily.    . metoprolol tartrate (LOPRESSOR) 25 MG tablet TAKE 1 TABLET BY MOUTH TWICE A DAY -MAY CRUSH IN FOOD 180 tablet 1  . mometasone (ELOCON) 0.1 % ointment APPLY TO AFFECTED AREA EVERY DAY AS NEEDED FOR RASH 45 g 0  . Multiple Vitamins-Minerals (AIRBORNE GUMMIES PO) Take 1 each by mouth daily.    . NONFORMULARY OR COMPOUNDED ITEM Compression socks  #1   15-20 mm/hg  Dx edema 1 each 1  . ondansetron (ZOFRAN-ODT) 4 MG disintegrating tablet USE UP TO THREE TIMES A DAY AS NEEDED FOR NAUSEA. 10 tablet 0  . polyethylene glycol powder (GLYCOLAX/MIRALAX) powder TAKE 17 GRAMS BY MOUTH 2 TIMES DAILY AS NEEDED. 527 g 5  . pravastatin (PRAVACHOL) 20 MG tablet TAKE 1 TABLET BY MOUTH EVERY DAY 90 tablet 1  . PROCTOFOAM HC rectal foam APPLY FOAM THREE TIMES A DAY AS NEEDED FOR 7 DAYS 10 g 10  . terazosin (HYTRIN) 5 MG capsule Take by mouth daily.  3  . triamcinolone (NASACORT) 55 MCG/ACT AERO nasal inhaler Place 2 sprays into the nose daily. 1  Inhaler 12  . Triamcinolone Acetonide (NASACORT ALLERGY 24HR NA) Place into the nose.    . triamcinolone cream (KENALOG) 0.1 % APPLY TO AFFECTED AREA TWICE A DAY 30 g 0  . venlafaxine XR (EFFEXOR-XR) 75 MG 24 hr capsule TAKE 1 CAPSULE (75 MG TOTAL) BY MOUTH DAILY WITH BREAKFAST. 90 capsule 0   No current facility-administered medications on file prior to visit.         Observations/Objective: 120/76 p 74  Wt 188.8 ht 5'10" Temp 98.1   Assessment and Plan: 1. Idiopathic chronic gout of multiple sites without tophus Stable  - Uric acid; Future  2. Hyperlipidemia associated with type 2 diabetes mellitus ([place]) Tolerating statin, encouraged heart healthy diet, avoid  trans fats, minimize simple carbs and saturated fats. Increase exercise as tolerated - Lipid panel; Future - Comprehensive metabolic panel; Future  3. Essential hypertension Well controlled, no changes to meds. Encouraged heart healthy diet such as the DASH diet and exercise as tolerated.  - Lipid panel; Future - Comprehensive metabolic panel; Future  4. Type 2 diabetes mellitus with hyperglycemia, without long-term current use of insulin ([facility]) Per endo - Comprehensive metabolic panel; Future  5. Depression, major, single episode, severe ([place]) Stable con't effexor   6. Other chronic pain hycet refilled   7. Gastroesophageal reflux disease, esophagitis presence not specified Refill pepcid Consider return to gi if symptoms persist   8. Central sleep apnea associated with atrial fibrillation ([place]) F/u neuro con't cpap  9,  Insomnia-- f/u neuro , pt has cpap       Try melatonin    Follow Up Instructions:    I discussed the assessment and treatment plan with the patient. The patient was provided an opportunity to ask questions and all were answered. The patient agreed with the plan and demonstrated an understanding of the instructions.   The patient was advised to call back or seek an in-person evaluation  if the symptoms worsen or if the condition fails to improve as anticipated.  I provided 45 minutes of non-face-to-face time during this encounter.   Ann Held, DO

## 2018-10-22 ENCOUNTER — Other Ambulatory Visit: Payer: Self-pay | Admitting: Family Medicine

## 2018-10-22 DIAGNOSIS — G8929 Other chronic pain: Secondary | ICD-10-CM

## 2018-10-22 NOTE — Telephone Encounter (Signed)
Last written:  Last ov: 10/21/18 Next ov: 04/26/19 Contract: due when he can UDS: due when he can

## 2018-10-22 NOTE — Telephone Encounter (Signed)
I sent this in during virtual visit

## 2018-10-25 MED ORDER — HYDROCODONE-ACETAMINOPHEN 7.5-325 MG/15ML PO SOLN: ORAL | 0 refills | Status: DC

## 2018-10-25 NOTE — Telephone Encounter (Signed)
This is only a  5 day supply.  How long would you like for this to last for him.

## 2018-10-25 NOTE — Telephone Encounter (Signed)
His last refill was 09/16/18.  I did not see anything sent in from visit.

## 2018-10-29 ENCOUNTER — Telehealth: Payer: Self-pay

## 2018-10-29 NOTE — Telephone Encounter (Signed)
PA request for Januvia faxed.

## 2018-11-02 ENCOUNTER — Encounter: Payer: Self-pay | Admitting: Internal Medicine

## 2018-11-02 ENCOUNTER — Other Ambulatory Visit: Payer: Self-pay | Admitting: Family Medicine

## 2018-11-08 ENCOUNTER — Other Ambulatory Visit: Payer: Self-pay | Admitting: Family Medicine

## 2018-11-09 ENCOUNTER — Telehealth: Payer: Self-pay

## 2018-11-09 DIAGNOSIS — E1165 Type 2 diabetes mellitus with hyperglycemia: Secondary | ICD-10-CM

## 2018-11-09 DIAGNOSIS — IMO0002 Reserved for concepts with insufficient information to code with codable children: Secondary | ICD-10-CM

## 2018-11-09 DIAGNOSIS — E1151 Type 2 diabetes mellitus with diabetic peripheral angiopathy without gangrene: Secondary | ICD-10-CM

## 2018-11-09 MED ORDER — SITAGLIPTIN PHOSPHATE 100 MG PO TABS: 100.0000 mg | ORAL_TABLET | Freq: Every day | ORAL | 2 refills | Status: DC

## 2018-11-09 NOTE — Telephone Encounter (Signed)
Prior authorization for Celesta Gentile has been approved by patient's insurance.  Coverage is effective 10/29/2018 to 02/26/2019 Approval number [id number]  Approval letter has been sent to scanning.

## 2018-11-11 ENCOUNTER — Ambulatory Visit (INDEPENDENT_AMBULATORY_CARE_PROVIDER_SITE_OTHER): Payer: BLUE CROSS/BLUE SHIELD | Admitting: Family Medicine

## 2018-11-11 ENCOUNTER — Encounter: Payer: Self-pay | Admitting: Family Medicine

## 2018-11-11 ENCOUNTER — Other Ambulatory Visit: Payer: Self-pay

## 2018-11-11 ENCOUNTER — Other Ambulatory Visit: Payer: BLUE CROSS/BLUE SHIELD

## 2018-11-11 DIAGNOSIS — G8929 Other chronic pain: Secondary | ICD-10-CM | POA: Diagnosis not present

## 2018-11-11 DIAGNOSIS — J0140 Acute pansinusitis, unspecified: Secondary | ICD-10-CM | POA: Diagnosis not present

## 2018-11-11 MED ORDER — AZITHROMYCIN 250 MG PO TABS: ORAL_TABLET | ORAL | 0 refills | Status: DC

## 2018-11-11 MED ORDER — HYDROCODONE-ACETAMINOPHEN 7.5-325 MG/15ML PO SOLN: ORAL | 0 refills | Status: DC

## 2018-11-11 MED ORDER — MOMETASONE FUROATE 50 MCG/ACT NA SUSP: 2.0000 | Freq: Every day | NASAL | 12 refills | Status: DC

## 2018-11-11 NOTE — Progress Notes (Signed)
Virtual Visit via Video Note  I connected with Chase [name] on 11/11/18 at 10:00 AM EDT by a video enabled telemedicine application and verified that I am speaking with the correct person using two identifiers.  Location: Patient: home Provider: home   I discussed the limitations of evaluation and management by telemedicine and the availability of in person appointments. The patient expressed understanding and agreed to proceed.  History of Present Illness: Pt is home with sinus congestion   No fevers he doesn't think.   + sinus headache, congestion  Pt taking mucinex, dayquil severe with some relief.  + cough -- dry    Observations/Objective: 98.6 temp Pt in NAD   Assessment and Plan: 1. Acute non-recurrent pansinusitis abx per orders con't nose sprays and antihistamine Call or rto prn  - mometasone (NASONEX) 50 MCG/ACT nasal spray; Place 2 sprays into the nose daily.  Dispense: 17 g; Refill: 12 - azithromycin (ZITHROMAX Z-PAK) 250 MG tablet; As directed  Dispense: 6 each; Refill: 0 - mometasone (NASONEX) 50 MCG/ACT nasal spray; Place 2 sprays into the nose daily.  Dispense: 17 g; Refill: 12  2. Other chronic pain Stable-- needed to use daily the  last few weeks due to sinus headache - HYDROcodone-acetaminophen (HYCET) 7.5-325 mg/15 ml solution; TAKE 20 ML EVERY 6 HOURS AS NEEDED FOR PAIN  Dispense: 473 mL; Refill: 0   Follow Up Instructions:    I discussed the assessment and treatment plan with the patient. The patient was provided an opportunity to ask questions and all were answered. The patient agreed with the plan and demonstrated an understanding of the instructions.   The patient was advised to call back or seek an in-person evaluation if the symptoms worsen or if the condition fails to improve as anticipated.  I provided 20 minutes of non-face-to-face time during this encounter.   Ann Held, DO

## 2018-11-13 ENCOUNTER — Encounter: Payer: Self-pay | Admitting: Family Medicine

## 2018-11-15 DIAGNOSIS — G4733 Obstructive sleep apnea (adult) (pediatric): Secondary | ICD-10-CM | POA: Diagnosis not present

## 2018-11-16 ENCOUNTER — Telehealth: Payer: Self-pay | Admitting: Nutrition

## 2018-11-16 NOTE — Telephone Encounter (Signed)
Noted. Ty!

## 2018-11-16 NOTE — Telephone Encounter (Signed)
Patient reports that he is not doing the [place], because it was too expensive.

## 2018-11-17 ENCOUNTER — Encounter: Payer: Self-pay | Admitting: Internal Medicine

## 2018-11-17 ENCOUNTER — Other Ambulatory Visit: Payer: Self-pay

## 2018-11-17 ENCOUNTER — Ambulatory Visit (INDEPENDENT_AMBULATORY_CARE_PROVIDER_SITE_OTHER): Payer: BC Managed Care – PPO | Admitting: Internal Medicine

## 2018-11-17 DIAGNOSIS — Z713 Dietary counseling and surveillance: Secondary | ICD-10-CM

## 2018-11-17 DIAGNOSIS — E1151 Type 2 diabetes mellitus with diabetic peripheral angiopathy without gangrene: Secondary | ICD-10-CM | POA: Diagnosis not present

## 2018-11-17 DIAGNOSIS — E1165 Type 2 diabetes mellitus with hyperglycemia: Secondary | ICD-10-CM

## 2018-11-17 DIAGNOSIS — E785 Hyperlipidemia, unspecified: Secondary | ICD-10-CM | POA: Diagnosis not present

## 2018-11-17 DIAGNOSIS — IMO0002 Reserved for concepts with insufficient information to code with codable children: Secondary | ICD-10-CM

## 2018-11-17 MED ORDER — FREESTYLE LIBRE 14 DAY READER DEVI: 1.0000 | 0 refills | Status: DC

## 2018-11-17 MED ORDER — SEMAGLUTIDE(0.25 OR 0.5MG/DOS) 2 MG/1.5ML ~~LOC~~ SOPN: 0.5000 mg | PEN_INJECTOR | SUBCUTANEOUS | 5 refills | Status: DC

## 2018-11-17 MED ORDER — FREESTYLE LIBRE 14 DAY SENSOR MISC: 1.0000 | 2 refills | Status: DC

## 2018-11-17 NOTE — Patient Instructions (Addendum)
Patient Instructions  Please continue: - Jardiance 25 mg in am  Please start Ozempic 0.25 mg weekly in a.m. (for example on Sunday morning) x 4 weeks, then increase to 0.5 mg weekly in a.m. if no nausea or hypoglycemia.  Please stop Januvia 100 after you inject the second Ozempic dose.  Please come back for a follow-up appointment in 3-4 months.

## 2018-11-17 NOTE — Progress Notes (Signed)
Patient ID: Chase [name], male   DOB: [birth year], 58 y.o.   MRN: [id number]  Patient location: Home My location: Office  Referring Provider: Ann Held, DO  I connected with the patient on 11/17/18 at 9:42 AM EDT by a video enabled telemedicine application and verified that I am speaking with the correct person.   I discussed the limitations of evaluation and management by telemedicine and the availability of in person appointments. The patient expressed understanding and agreed to proceed.   Details of the encounter are shown below.  HPI: Chase [name] is a 58 y.o.-year-old male, presenting for follow-up for DM2, dx in 2014, non-insulin-dependent, uncontrolled, with complications (PVD, cerebro-vascular ds - s/p "mini"strokes, PN). Last visit 3 months ago.  He has a sinus infection >> sugars higher.  Last hemoglobin A1c was: Lab Results  Component Value Date   HGBA1C 7.9 (A) 08/04/2018   HGBA1C 7.5 (A) 12/21/2017   HGBA1C 7.4 09/17/2017   Pt is on a regimen of: - Jardiance 25 mg in am - Januvia 100 mg in am (was off for 1-2 weeks) Stopped Glipizide b/c rash. Stopped Invokana 100 mg in am >> only got this for 1 mo He was on Metformin 500 mg po bid - in liquid form, as he could not swallow pills. He gets gi upset. He stopped this in 2014.  He is checking sugars occasionally  - has Raynaud's sd. -Fingersticks are not accurate, but the freestyle libre CGM was not approved by his insurance. - brunch: 130-150 >> 153, 228 >> ? >> 157, 181  - 2h after brunch:  312 >> 150-220 >> n/c >> 206, 243 - before dinner: 216 >> 150-200 >> n/c - 2h after dinner:  180-210 >> 260 >> n/c - bedtime: 172-214 >> 208 >> ? >> 164, 173 - nighttime: n/c Lowest sugar was 100s >> 157;  he has hypoglycemia awareness in the 70s. Highest sugar was "almost 300" >> 246 (January).  Glucometer: [employer] Next  He still drinks occasional lemonade and ginger ale.  -+ Mild CKD, last  BUN/creatinine:  Lab Results  Component Value Date   BUN 12 12/10/2017   BUN 17 08/04/2017   CREATININE 1.27 12/10/2017   CREATININE 1.09 08/04/2017   -+HL.  last set of lipids: Lab Results  Component Value Date   CHOL 158 12/10/2017   HDL 30.00 (L) 12/10/2017   LDLCALC 102 (H) 08/04/2017   LDLDIRECT 80.0 12/10/2017   TRIG 260.0 (H) 12/10/2017   CHOLHDL 5 12/10/2017  On  pravastatin 20. - last eye exam was in 02/2016: No DR.  He did not schedule a new appointment despite multiple promptings.  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 He has anxiety, PTSD from childhood. Has varicose veins >> wears compresion hoses. He also has a h/o kidney stones.  On allopurinol for gout.  ROS: Constitutional: no weight gain/no weight loss, no fatigue, + subjective hyperthermia (during his URI/sinusitis), no subjective hypothermia Eyes: no blurry vision, no xerophthalmia ENT: + sore throat, no nodules palpated in neck, no dysphagia, no odynophagia, no hoarseness Cardiovascular: no CP/no SOB/no palpitations/no leg swelling Respiratory: + cough/no SOB/no wheezing Gastrointestinal: no N/no V/no D/no C/no acid reflux Musculoskeletal: no muscle aches/no joint aches Skin: no rashes, no hair loss Neurological: no tremors/+ numbness/+ tingling/no dizziness  I reviewed pt's medications, allergies, PMH,  social hx, family hx, and changes were documented in the history of present illness. Otherwise, unchanged from my initial visit note.  Past Medical History:  Diagnosis Date  . Allergy   . Arthritis   . Blood in stool   . Brain cyst   . Chest pain   . Constipation   . Diabetes mellitus without complication ([place])   . Difficulty urinating   . GERD (gastroesophageal reflux disease)   . Headache(784.0)   . Hearing loss   . Hyperlipidemia   . Hypertension   . Leg swelling   . Nasal  congestion   . PONV (postoperative nausea and vomiting)   . Rectal pain   . Rectal ulcer with bleeding after hemorrhoid banding 02/11/2015  . Sleep apnea   . Stroke ([place])   . Stroke ([place])   . Thrombosed hemorrhoids   . Trouble swallowing    Past Surgical History:  Procedure Laterality Date  . CATARACT EXTRACTION     x 3  . [place]   cataracts  . FLEXIBLE SIGMOIDOSCOPY N/A 02/11/2015   Procedure: FLEXIBLE SIGMOIDOSCOPY;  Surgeon: Gatha Mayer, MD;  Location: [facility];  Service: Endoscopy;  Laterality: N/A;  . INNER EAR SURGERY     rt ear  . INNER EAR SURGERY  1992  . surgery - right arm     1983   Social History   Socioeconomic History  . Marital status: Single    Spouse name: Not on file  . Number of children: 0  . Years of education: Not on file  . Highest education level: Not on file  Occupational History  . Not on file  Social Needs  . Financial resource strain: Not on file  . Food insecurity:    Worry: Not on file    Inability: Not on file  . Transportation needs:    Medical: Not on file    Non-medical: Not on file  Tobacco Use  . Smoking status: Never Smoker  . Smokeless tobacco: Never Used  Substance and Sexual Activity  . Alcohol use: No    Alcohol/week: 0.0 standard drinks    Comment: none  . Drug use: No  . Sexual activity: Never  Lifestyle  . Physical activity:    Days per week: Not on file    Minutes per session: Not on file  . Stress: Not on file  Relationships  . Social connections:    Talks on phone: Not on file    Gets together: Not on file    Attends religious service: Not on file    Active member of club or organization: Not on file    Attends meetings of clubs or organizations: Not on file    Relationship status: Not on file  . Intimate partner violence:    Fear of current or ex partner: Not on file    Emotionally abused: Not on file    Physically abused: Not on file    Forced sexual activity: Not on file   Other Topics Concern  . Not on file  Social History Narrative   No caffeine intake except for chocolate.  Exercised-walking   Current Outpatient Medications on File Prior to Visit  Medication Sig Dispense Refill  . allopurinol (ZYLOPRIM) 100 MG tablet Take 1 tablet (100 mg total) by mouth daily. 90 tablet 1  . amLODipine (NORVASC) 5 MG tablet Take 1 tablet (5 mg total) by mouth daily. 90 tablet 1  . Azelastine HCl  0.15 % SOLN PLACE 2 SPRAYS IN EACH NOSTRIL every night 30 mL 11  . azithromycin (ZITHROMAX Z-PAK) 250 MG tablet As directed 6 each 0  . azithromycin (ZITHROMAX Z-PAK) 250 MG tablet As directed 6 each 0  . BAYER MICROLET LANCETS lancets Use 1x a day 100 each 4  . betamethasone dipropionate (DIPROLENE) 0.05 % ointment Apply topically 2 (two) times daily. 30 g 0  . cephALEXin (KEFLEX) 250 MG/5ML suspension Take 10 mLs (500 mg total) by mouth 3 (three) times daily. 200 mL 0  . Continuous Blood Gluc Receiver (FREESTYLE LIBRE 14 DAY READER) DEVI 1 Device by Does not apply route every 14 (fourteen) days. 1 Device 0  . Continuous Blood Gluc Sensor (FREESTYLE LIBRE 14 DAY SENSOR) MISC 1 each by Subdermal route every 14 (fourteen) days. 6 each 2  . cyclobenzaprine (FLEXERIL) 10 MG tablet TAKE 1 TABLET BY MOUTH AT BEDTIME AS NEEDED FOR MUSCLE SPASMS 30 tablet 1  . diphenhydrAMINE (BENADRYL) 25 MG tablet     . empagliflozin (JARDIANCE) 25 MG TABS tablet Take 25 mg by mouth daily. 90 tablet 3  . famotidine (PEPCID) 40 MG/5ML suspension TAKE 2.5 MLS (20 MG TOTAL) BY MOUTH DAILY. 100 mL 2  . FIBER SELECT GUMMIES PO Take 1 tablet by mouth daily.    Marland Kitchen gabapentin (NEURONTIN) 100 MG capsule TAKE 1 CAPSULE BY MOUTH THREE TIMES A DAY 90 capsule 3  . gentamicin cream (GARAMYCIN) 0.1 % Apply 1 application topically 3 (three) times daily. 30 g 1  . glucose blood test strip Use 1x a day - E11.51, E11.65 Dispense Contour Next Strips 100 each 3  . guaiFENesin (MUCINEX) 600 MG 12 hr tablet Take 1 tablet  (600 mg total) by mouth 2 (two) times daily. 60 tablet 1  . GuaiFENesin 200 MG/10ML SOLN     . HYDROcodone-acetaminophen (HYCET) 7.5-325 mg/15 ml solution TAKE 20 ML EVERY 6 HOURS AS NEEDED FOR PAIN 473 mL 0  . hydrOXYzine (ATARAX/VISTARIL) 25 MG tablet Take 1 tablet by mouth every 8 hours as needed for anxiety or itching 90 tablet 1  . ibuprofen (ADVIL) 200 MG tablet     . loratadine (CLARITIN) 10 MG tablet TAKE 1 TABLET BY MOUTH EVERY DAY 30 tablet 11  . metoprolol tartrate (LOPRESSOR) 25 MG tablet TAKE 1 TABLET BY MOUTH TWICE A DAY -MAY CRUSH IN FOOD 180 tablet 1  . mometasone (ELOCON) 0.1 % ointment APPLY TO AFFECTED AREA EVERY DAY AS NEEDED FOR RASH 45 g 0  . mometasone (NASONEX) 50 MCG/ACT nasal spray Place 2 sprays into the nose daily. 17 g 12  . mometasone (NASONEX) 50 MCG/ACT nasal spray Place 2 sprays into the nose daily. 17 g 12  . Multiple Vitamins-Minerals (AIRBORNE GUMMIES PO) Take 1 each by mouth daily.    . NONFORMULARY OR COMPOUNDED ITEM Compression socks  #1   15-20 mm/hg  Dx edema 1 each 1  . ondansetron (ZOFRAN-ODT) 4 MG disintegrating tablet USE UP TO THREE TIMES A DAY AS NEEDED FOR NAUSEA. 10 tablet 0  . polyethylene glycol powder (GLYCOLAX/MIRALAX) powder TAKE 17 GRAMS BY MOUTH 2 TIMES DAILY AS NEEDED. 527 g 5  . pravastatin (PRAVACHOL) 20 MG tablet TAKE 1 TABLET BY MOUTH EVERY DAY 90 tablet 1  . PROCTOFOAM HC rectal foam APPLY FOAM THREE TIMES A DAY AS NEEDED FOR 7 DAYS 10 g 10  . sitaGLIPtin (JANUVIA) 100 MG tablet Take 1 tablet (100 mg total) by mouth daily. 30 tablet 2  .  terazosin (HYTRIN) 5 MG capsule Take by mouth daily.  3  . triamcinolone (NASACORT) 55 MCG/ACT AERO nasal inhaler Place 2 sprays into the nose daily. 1 Inhaler 12  . Triamcinolone Acetonide (NASACORT ALLERGY 24HR NA) Place into the nose.    . triamcinolone cream (KENALOG) 0.1 % APPLY TO AFFECTED AREA TWICE A DAY 30 g 0  . venlafaxine XR (EFFEXOR-XR) 75 MG 24 hr capsule TAKE 1 CAPSULE (75 MG TOTAL) BY  MOUTH DAILY WITH BREAKFAST. 90 capsule 1   No current facility-administered medications on file prior to visit.    Allergies  Allergen Reactions  . Amoxicillin Hives, Itching and Other (See Comments)    White tongue; ? hives  . Fluticasone     Other reaction(s): Other Sores inside nose  . Sulfa Antibiotics Rash  . Flonase [Fluticasone Propionate]     Nasal Sores  . Terfenadine     ? reaction   Family History  Problem Relation Age of Onset  . Breast cancer Mother 31       breast  . Huntington's disease Mother   . Stroke Father   . Heart disease Father   . Hypertension Father   . Heart attack Father   . Hypertension Brother   . Hyperlipidemia Neg Hx   . Diabetes Neg Hx    PE: There were no vitals taken for this visit. Wt Readings from Last 3 Encounters:  08/04/18 194 lb 12.8 oz (88.4 kg)  08/04/18 194 lb (88 kg)  05/10/18 194 lb (88 kg)   Constitutional:  in NAD  The physical exam was not performed (virtual visit).  ASSESSMENT: 1. DM2, non-insulin-dependent, uncontrolled, with complications - PVD - cerebro-vascular ds - s/p "mini"strokes - PN  2. HL  3. Dietary counseling  PLAN:  1. Patient with history of uncontrolled type 2 diabetes, on DPP 4 inhibitor and SGLT 2 inhibitor.  He has deteriorating control when his insurance is not covering one or the other of the 2 medicines.  We had multiple problems with his insurance and submitted multiple preauthorization's for each of the medications.  Most recent appointment was for Januvia, but he had Jardiance approved 06/2018. -He was previously on a freestyle libre CGM, but this stopped being covered by his insurance despite 2 PAs submitted due to the fact that patient has Raynaud's phenomenon and he cannot check his sugars frequently and when he checks, these are not accurate.  We plan to call the insurance and try to see if he can get this approved. -At last visit, with discussed about stopping juice and regular sodas.   I did refer him to nutrition in the past but he did not go yet.  At that time, sugars were higher over the holidays as she was out of Jardiance.  After we restarted, sugars started to improve.  His HbA1c was higher, at 7.9% - sugars are worse at this visit but he cannot give me very many data points since he is not checking frequently due to the above issue.  However, since sugars are worse, I suggested to switch from Januvia to Ozempic.  We discussed about benefits and possible side effects of GLP-1 receptor agonists and will discuss that we need to start at a low dose and advance as tolerated.  I am hoping that this would be covered by his insurance.  If not, we may need to switch to a daily GLP-1 receptor agonist and if this is not covered either, will need long-acting insulin he  agrees with this plan. - I suggested to:  Patient Instructions  Please continue: - Jardiance 25 mg in am  Please start Ozempic 0.25 mg weekly in a.m. (for example on Sunday morning) x 4 weeks, then increase to 0.5 mg weekly in a.m. if no nausea or hypoglycemia.  Please stop Januvia 100 after you inject the second Ozempic dose.  Please come back for a follow-up appointment in 3-4 months.  -  we will check his HbA1c when he returns to the clinic - continue checking sugars at different times of the day - check 1x a day, rotating checks - advised for yearly eye exams >> he is not UTD - Return to clinic in 3-4 mo with sugar log      2. HL - Reviewed latest lipid panel from 11/2017: LDL at goal, improved, triglycerides high, HDL low Lab Results  Component Value Date   CHOL 158 12/10/2017   HDL 30.00 (L) 12/10/2017   LDLCALC 102 (H) 08/04/2017   LDLDIRECT 80.0 12/10/2017   TRIG 260.0 (H) 12/10/2017   CHOLHDL 5 12/10/2017  - Continues Lipitor without side effects.  3. Dietary counseling -He is still drinking juice and regular sodas and at this visit we discussed about healthier options.  We discussed about  artificial sweeteners and which low-calorie sweeteners are better than others.    Philemon Kingdom, MD PhD [facility]

## 2018-11-18 ENCOUNTER — Telehealth: Payer: Self-pay

## 2018-11-18 NOTE — Telephone Encounter (Signed)
PA faxed to [place] for [place] CGM.  Will call next week to follow up because [place] [employer] does not notify us regarding approval or not.

## 2018-11-18 NOTE — Telephone Encounter (Signed)
Ozempic not covered, PA sent to [place].  Will call next week to follow up since [place] does not notify us of approval or denial.

## 2018-11-23 ENCOUNTER — Other Ambulatory Visit: Payer: Self-pay | Admitting: Family Medicine

## 2018-11-25 ENCOUNTER — Encounter: Payer: Self-pay | Admitting: Internal Medicine

## 2018-12-06 ENCOUNTER — Other Ambulatory Visit: Payer: Self-pay | Admitting: Family Medicine

## 2018-12-06 ENCOUNTER — Other Ambulatory Visit: Payer: Self-pay | Admitting: Internal Medicine

## 2018-12-06 DIAGNOSIS — M25511 Pain in right shoulder: Secondary | ICD-10-CM

## 2018-12-08 DIAGNOSIS — M542 Cervicalgia: Secondary | ICD-10-CM | POA: Diagnosis not present

## 2018-12-08 DIAGNOSIS — M9903 Segmental and somatic dysfunction of lumbar region: Secondary | ICD-10-CM | POA: Diagnosis not present

## 2018-12-08 DIAGNOSIS — M546 Pain in thoracic spine: Secondary | ICD-10-CM | POA: Diagnosis not present

## 2018-12-08 DIAGNOSIS — M722 Plantar fascial fibromatosis: Secondary | ICD-10-CM | POA: Diagnosis not present

## 2018-12-08 NOTE — Telephone Encounter (Signed)
Last cyclobenzaprine RX: 10/05/18, #30 x 1 refill Last OV: 11/11/18 Next OV: 04/26/19

## 2018-12-13 ENCOUNTER — Telehealth: Payer: Self-pay

## 2018-12-13 MED ORDER — BYDUREON 2 MG ~~LOC~~ PEN: 2.0000 mg | PEN_INJECTOR | SUBCUTANEOUS | 2 refills | Status: DC

## 2018-12-13 NOTE — Telephone Encounter (Signed)
They appear to cover Bydureon and Victoza.  Bydureon is once a week, exercise once a day.  Please try to send Bydureon 2 mg taken once a week.

## 2018-12-13 NOTE — Telephone Encounter (Signed)
Those are only covered if:   Requires trial and failure or insufficient response to metformin containing products unless contraindicated or documented adverse event when using either a preferred or  a non-preferred GLP-1 Receptor Agonist and Combination

## 2018-12-13 NOTE — Telephone Encounter (Signed)
Bydureon is not covered. Please advise if you would like a PA.

## 2018-12-13 NOTE — Telephone Encounter (Signed)
We tried metformin liquid since he cannot swallow large pills and they also cause GI upset.  However, the liquid metformin stopped being covered so he is now off metformin completely.

## 2018-12-13 NOTE — Telephone Encounter (Signed)
RX sent

## 2018-12-13 NOTE — Telephone Encounter (Signed)
In the [facility] list of covered medication it appears that Bydureon, Victoza, and Byetta are all covered... If not, yes, I would like to proceed with a PA.  If this is not going through, maybe we can try Victoza at 1.8 mg daily in the morning.

## 2018-12-13 NOTE — Telephone Encounter (Signed)
Medicaid has denied PA for ozempic.

## 2018-12-16 NOTE — Telephone Encounter (Signed)
Faxed PA

## 2018-12-23 ENCOUNTER — Other Ambulatory Visit: Payer: Self-pay | Admitting: Family Medicine

## 2018-12-23 DIAGNOSIS — G8929 Other chronic pain: Secondary | ICD-10-CM

## 2018-12-23 MED ORDER — HYDROCODONE-ACETAMINOPHEN 7.5-325 MG/15ML PO SOLN: ORAL | 0 refills | Status: DC

## 2019-01-02 ENCOUNTER — Other Ambulatory Visit: Payer: Self-pay | Admitting: Family Medicine

## 2019-01-02 DIAGNOSIS — E785 Hyperlipidemia, unspecified: Secondary | ICD-10-CM

## 2019-01-19 ENCOUNTER — Other Ambulatory Visit: Payer: Self-pay | Admitting: Family Medicine

## 2019-01-19 DIAGNOSIS — G8929 Other chronic pain: Secondary | ICD-10-CM

## 2019-01-19 MED ORDER — HYDROCODONE-ACETAMINOPHEN 7.5-325 MG/15ML PO SOLN: ORAL | 0 refills | Status: DC

## 2019-01-19 NOTE — Telephone Encounter (Signed)
Requesting: Hycet Contract: 08/04/2017 UDS: 08/04/2017, low risk, next screen 02/07/2018 Last OV: 11/11/2018 Next OV: 04/26/2019 Last Refill: 12/23/2018, 473 ML--0 RF Database:   Please advise

## 2019-02-08 ENCOUNTER — Other Ambulatory Visit: Payer: Self-pay | Admitting: Family Medicine

## 2019-02-08 DIAGNOSIS — M25511 Pain in right shoulder: Secondary | ICD-10-CM

## 2019-02-10 DIAGNOSIS — N400 Enlarged prostate without lower urinary tract symptoms: Secondary | ICD-10-CM | POA: Diagnosis not present

## 2019-02-10 DIAGNOSIS — N138 Other obstructive and reflux uropathy: Secondary | ICD-10-CM | POA: Diagnosis not present

## 2019-02-10 DIAGNOSIS — N401 Enlarged prostate with lower urinary tract symptoms: Secondary | ICD-10-CM | POA: Diagnosis not present

## 2019-02-10 DIAGNOSIS — N411 Chronic prostatitis: Secondary | ICD-10-CM | POA: Diagnosis not present

## 2019-02-14 ENCOUNTER — Other Ambulatory Visit: Payer: Self-pay | Admitting: Family Medicine

## 2019-02-14 DIAGNOSIS — G8929 Other chronic pain: Secondary | ICD-10-CM

## 2019-02-15 MED ORDER — HYDROCODONE-ACETAMINOPHEN 7.5-325 MG/15ML PO SOLN: ORAL | 0 refills | Status: DC

## 2019-02-15 NOTE — Telephone Encounter (Signed)
Last written: 01/19/19 Last ov: 11/11/18 Next ov: 04/26/19 Contract: will get at next visit UDS: will get at next visit

## 2019-03-03 ENCOUNTER — Other Ambulatory Visit: Payer: Self-pay | Admitting: Family Medicine

## 2019-03-03 ENCOUNTER — Encounter: Payer: Self-pay | Admitting: Family Medicine

## 2019-03-03 DIAGNOSIS — G8929 Other chronic pain: Secondary | ICD-10-CM

## 2019-03-04 MED ORDER — HYDROCODONE-ACETAMINOPHEN 7.5-325 MG/15ML PO SOLN: ORAL | 0 refills | Status: DC

## 2019-03-04 NOTE — Telephone Encounter (Signed)
Requesting: Hycet Contract: 08/04/2017 UDS: 08/04/2017 Last OV: 11/11/2018 Next OV: 03/07/2019 Last Refill: 02/15/2019, 473 ML--0 RF Database:   Please advise

## 2019-03-05 ENCOUNTER — Other Ambulatory Visit: Payer: Self-pay | Admitting: Family Medicine

## 2019-03-05 DIAGNOSIS — I10 Essential (primary) hypertension: Secondary | ICD-10-CM

## 2019-03-07 ENCOUNTER — Encounter: Payer: Self-pay | Admitting: Family Medicine

## 2019-03-07 ENCOUNTER — Ambulatory Visit (INDEPENDENT_AMBULATORY_CARE_PROVIDER_SITE_OTHER): Payer: BC Managed Care – PPO | Admitting: Family Medicine

## 2019-03-07 ENCOUNTER — Other Ambulatory Visit: Payer: Self-pay

## 2019-03-07 DIAGNOSIS — R21 Rash and other nonspecific skin eruption: Secondary | ICD-10-CM | POA: Diagnosis not present

## 2019-03-07 DIAGNOSIS — J029 Acute pharyngitis, unspecified: Secondary | ICD-10-CM

## 2019-03-07 DIAGNOSIS — R6889 Other general symptoms and signs: Secondary | ICD-10-CM | POA: Diagnosis not present

## 2019-03-07 DIAGNOSIS — Z20822 Contact with and (suspected) exposure to covid-19: Secondary | ICD-10-CM

## 2019-03-07 MED ORDER — TRIAMCINOLONE ACETONIDE 0.1 % EX CREA: 1.0000 "application " | TOPICAL_CREAM | Freq: Two times a day (BID) | CUTANEOUS | 0 refills | Status: DC

## 2019-03-07 MED ORDER — AZITHROMYCIN 250 MG PO TABS: ORAL_TABLET | ORAL | 0 refills | Status: DC

## 2019-03-07 NOTE — Progress Notes (Signed)
Virtual Visit via Video Note  I connected with Chase [name] on 03/07/19 at  1:40 PM EDT by a video enabled telemedicine application and verified that I am speaking with the correct person using two identifiers.  Location: Patient: home Provider: office    I discussed the limitations of evaluation and management by telemedicine and the availability of in person appointments. The patient expressed understanding and agreed to proceed.  History of Present Illness: Pt is home c/o rash from neck to knees.  He is using dove , anti itch and lotrimen with no relief.  It started last month   He is also having congestion and sore throat  No fevers     Observations/Objective: No vitals obtained  Pt is in NAD + fine errythematous rash on abd and chest    Assessment and Plan: 1. Pharyngitis, unspecified etiology z pack  Pt with hx of DM ---  Will consider pred taper if needed for rash ? Strep throat due to rash with sore throat  - azithromycin (ZITHROMAX Z-PAK) 250 MG tablet; As directed  Dispense: 6 each; Refill: 0 - Novel Coronavirus, NAA ([facility])  2. Rash ? Scarlet fever Pt denies working outside, new soaps, detergents , lotions etc  Try cream first -- consider pred taper if no relief but pt is Diabetic  - triamcinolone cream (KENALOG) 0.1 %; Apply 1 application topically 2 (two) times daily.  Dispense: 30 g; Refill: 0  Follow Up Instructions:    I discussed the assessment and treatment plan with the patient. The patient was provided an opportunity to ask questions and all were answered. The patient agreed with the plan and demonstrated an understanding of the instructions.   The patient was advised to call back or seek an in-person evaluation if the symptoms worsen or if the condition fails to improve as anticipated.  I provided 15 minutes of non-face-to-face time during this encounter.   Ann Held, DO

## 2019-03-08 LAB — NOVEL CORONAVIRUS, NAA: SARS-CoV-2, NAA: NOT DETECTED

## 2019-03-20 ENCOUNTER — Other Ambulatory Visit: Payer: Self-pay | Admitting: Family Medicine

## 2019-03-20 DIAGNOSIS — J029 Acute pharyngitis, unspecified: Secondary | ICD-10-CM

## 2019-03-20 DIAGNOSIS — R21 Rash and other nonspecific skin eruption: Secondary | ICD-10-CM

## 2019-03-20 DIAGNOSIS — G8929 Other chronic pain: Secondary | ICD-10-CM

## 2019-03-21 ENCOUNTER — Other Ambulatory Visit: Payer: Self-pay | Admitting: Family Medicine

## 2019-03-21 ENCOUNTER — Encounter: Payer: Self-pay | Admitting: Family Medicine

## 2019-03-21 DIAGNOSIS — K219 Gastro-esophageal reflux disease without esophagitis: Secondary | ICD-10-CM

## 2019-03-21 DIAGNOSIS — G8929 Other chronic pain: Secondary | ICD-10-CM

## 2019-03-21 MED ORDER — HYDROCODONE-ACETAMINOPHEN 7.5-325 MG/15ML PO SOLN: ORAL | 0 refills | Status: DC

## 2019-03-21 MED ORDER — TRIAMCINOLONE ACETONIDE 0.1 % EX CREA: 1.0000 "application " | TOPICAL_CREAM | Freq: Two times a day (BID) | CUTANEOUS | 0 refills | Status: DC

## 2019-03-21 NOTE — Telephone Encounter (Signed)
Requesting: Hycet Contract: 08/04/2017 UDS: 08/04/2017, next screening 02/07/2018 Last OV: 03/07/2019 Next OV: 04/26/2019 Last Refill: 03/04/2019, 473 ML--0 RF Database:   Please advise

## 2019-03-21 NOTE — Telephone Encounter (Signed)
Too early--- pt needs uds and contract

## 2019-03-21 NOTE — Telephone Encounter (Signed)
Doxycycline 100 mg bid x 7 days  I will refill a sm amount If congestion con't he will need to go go UC or ER

## 2019-03-22 ENCOUNTER — Other Ambulatory Visit: Payer: Self-pay | Admitting: *Deleted

## 2019-03-22 MED ORDER — DOXYCYCLINE HYCLATE 100 MG PO TABS: 100.0000 mg | ORAL_TABLET | Freq: Two times a day (BID) | ORAL | 0 refills | Status: AC

## 2019-03-22 NOTE — Progress Notes (Signed)
d 

## 2019-03-25 ENCOUNTER — Telehealth: Payer: Self-pay

## 2019-03-25 NOTE — Telephone Encounter (Signed)
PA renewal for Januvia filled out and faxed to [facility].

## 2019-03-30 ENCOUNTER — Other Ambulatory Visit: Payer: Self-pay | Admitting: Family Medicine

## 2019-03-30 ENCOUNTER — Encounter: Payer: Self-pay | Admitting: Family Medicine

## 2019-03-30 DIAGNOSIS — G8929 Other chronic pain: Secondary | ICD-10-CM

## 2019-03-30 DIAGNOSIS — M25511 Pain in right shoulder: Secondary | ICD-10-CM

## 2019-03-30 MED ORDER — HYDROCODONE-ACETAMINOPHEN 7.5-325 MG/15ML PO SOLN: ORAL | 0 refills | Status: DC

## 2019-03-30 NOTE — Telephone Encounter (Signed)
I will refill x 1---- if the pain is getting worse we may need to refer to pain clinic

## 2019-04-08 ENCOUNTER — Encounter: Payer: Self-pay | Admitting: Internal Medicine

## 2019-04-08 ENCOUNTER — Other Ambulatory Visit: Payer: Self-pay | Admitting: Family Medicine

## 2019-04-08 ENCOUNTER — Encounter: Payer: Self-pay | Admitting: Family Medicine

## 2019-04-08 DIAGNOSIS — F322 Major depressive disorder, single episode, severe without psychotic features: Secondary | ICD-10-CM

## 2019-04-08 DIAGNOSIS — G894 Chronic pain syndrome: Secondary | ICD-10-CM

## 2019-04-11 NOTE — Telephone Encounter (Signed)
childrens advil or Motrin is Ibuprofen--- he could use those I'll place referral

## 2019-04-12 ENCOUNTER — Encounter: Payer: Self-pay | Admitting: Family Medicine

## 2019-04-13 ENCOUNTER — Encounter: Payer: Self-pay | Admitting: Family Medicine

## 2019-04-13 ENCOUNTER — Telehealth: Payer: Self-pay | Admitting: Family Medicine

## 2019-04-13 ENCOUNTER — Ambulatory Visit (INDEPENDENT_AMBULATORY_CARE_PROVIDER_SITE_OTHER): Payer: BC Managed Care – PPO | Admitting: Family Medicine

## 2019-04-13 ENCOUNTER — Other Ambulatory Visit: Payer: Self-pay

## 2019-04-13 VITALS — BP 125/79 | HR 106 | Temp 99.4°F | Ht 70.0 in

## 2019-04-13 DIAGNOSIS — M542 Cervicalgia: Secondary | ICD-10-CM | POA: Diagnosis not present

## 2019-04-13 DIAGNOSIS — J0140 Acute pansinusitis, unspecified: Secondary | ICD-10-CM | POA: Diagnosis not present

## 2019-04-13 DIAGNOSIS — G8929 Other chronic pain: Secondary | ICD-10-CM

## 2019-04-13 DIAGNOSIS — M546 Pain in thoracic spine: Secondary | ICD-10-CM | POA: Diagnosis not present

## 2019-04-13 DIAGNOSIS — R05 Cough: Secondary | ICD-10-CM | POA: Diagnosis not present

## 2019-04-13 DIAGNOSIS — R059 Cough, unspecified: Secondary | ICD-10-CM

## 2019-04-13 DIAGNOSIS — I10 Essential (primary) hypertension: Secondary | ICD-10-CM

## 2019-04-13 DIAGNOSIS — M9903 Segmental and somatic dysfunction of lumbar region: Secondary | ICD-10-CM | POA: Diagnosis not present

## 2019-04-13 DIAGNOSIS — M722 Plantar fascial fibromatosis: Secondary | ICD-10-CM | POA: Diagnosis not present

## 2019-04-13 MED ORDER — DOXYCYCLINE HYCLATE 100 MG PO TABS: 100.0000 mg | ORAL_TABLET | Freq: Two times a day (BID) | ORAL | 0 refills | Status: DC

## 2019-04-13 MED ORDER — PROMETHAZINE-DM 6.25-15 MG/5ML PO SYRP: 5.0000 mL | ORAL_SOLUTION | Freq: Four times a day (QID) | ORAL | 0 refills | Status: DC | PRN

## 2019-04-13 MED ORDER — METOPROLOL TARTRATE 25 MG PO TABS: ORAL_TABLET | ORAL | 1 refills | Status: DC

## 2019-04-13 MED ORDER — HYDROCODONE-ACETAMINOPHEN 7.5-325 MG/15ML PO SOLN: ORAL | 0 refills | Status: DC

## 2019-04-13 NOTE — Telephone Encounter (Signed)
[facility]ase [name], called stating she is needing more information for the referral for pt. She states she is needing demographic information,  office notes for the condition, and any imaging. Please advise.   Callback # [phone]  Fax # [phone]

## 2019-04-13 NOTE — Progress Notes (Signed)
Virtual Visit via Video Note  I connected with Chase [name] on 04/13/19 at 10:20 AM EDT by a video enabled telemedicine application and verified that I am speaking with the correct person using two identifiers.  Location: Patient: home  Provider: home   I discussed the limitations of evaluation and management by telemedicine and the availability of in person appointments. The patient expressed understanding and agreed to proceed.  History of Present Illness: Pt is home c/o ears clogged   + nasal congestion   + coughing   No fever +bodyaches     Observations/Objective: Today's Vitals   04/13/19 1017  BP: 125/79  Pulse: (!) 106  Temp: 99.4 F (37.4 C)  Height: 5\' 10"  (1.778 m)   Body mass index is 27.95 kg/m. Pt is in NAD   Assessment and Plan: 1. Acute non-recurrent pansinusitis con't nasonex/ astelin  abx per  - doxycycline (VIBRA-TABS) 100 MG tablet; Take 1 tablet (100 mg total) by mouth 2 (two) times daily.  Dispense: 20 tablet; Refill: 0 - Novel Coronavirus, NAA ([facility])  2. Essential hypertension Well controlled, no changes to meds. Encouraged heart healthy diet such as the DASH diet and exercise as tolerated.  - metoprolol tartrate (LOPRESSOR) 25 MG tablet; 1 po bid  Dispense: 180 tablet; Refill: 1   Follow Up Instructions:   I discussed the assessment and treatment plan with the patient. The patient was provided an opportunity to ask questions and all were answered. The patient agreed with the plan and demonstrated an understanding of the instructions.   The patient was advised to call back or seek an in-person evaluation if the symptoms worsen or if the condition fails to improve as anticipated.  I provided 15 minutes of non-face-to-face time during this encounter.   Ann Held, DO

## 2019-04-14 ENCOUNTER — Ambulatory Visit: Payer: BC Managed Care – PPO | Admitting: Family Medicine

## 2019-04-15 NOTE — Telephone Encounter (Signed)
Gwen did you send this info already?

## 2019-04-21 DIAGNOSIS — M542 Cervicalgia: Secondary | ICD-10-CM | POA: Diagnosis not present

## 2019-04-21 DIAGNOSIS — M9903 Segmental and somatic dysfunction of lumbar region: Secondary | ICD-10-CM | POA: Diagnosis not present

## 2019-04-21 DIAGNOSIS — M546 Pain in thoracic spine: Secondary | ICD-10-CM | POA: Diagnosis not present

## 2019-04-21 DIAGNOSIS — M722 Plantar fascial fibromatosis: Secondary | ICD-10-CM | POA: Diagnosis not present

## 2019-04-22 ENCOUNTER — Other Ambulatory Visit: Payer: Self-pay

## 2019-04-22 DIAGNOSIS — E1165 Type 2 diabetes mellitus with hyperglycemia: Secondary | ICD-10-CM

## 2019-04-22 DIAGNOSIS — IMO0002 Reserved for concepts with insufficient information to code with codable children: Secondary | ICD-10-CM

## 2019-04-22 DIAGNOSIS — E1151 Type 2 diabetes mellitus with diabetic peripheral angiopathy without gangrene: Secondary | ICD-10-CM

## 2019-04-22 MED ORDER — SITAGLIPTIN PHOSPHATE 100 MG PO TABS: 100.0000 mg | ORAL_TABLET | Freq: Every day | ORAL | 2 refills | Status: DC

## 2019-04-22 NOTE — Telephone Encounter (Signed)
Prior authorization for Celesta Gentile has been approved by patient's insurance.  Coverage is effective 03/25/2019 to 06/23/2019  PA Approval/Reference Number [id number]  Approval letter has been sent to scanning.

## 2019-04-25 ENCOUNTER — Other Ambulatory Visit: Payer: Self-pay

## 2019-04-26 ENCOUNTER — Encounter: Payer: Self-pay | Admitting: Internal Medicine

## 2019-04-26 ENCOUNTER — Ambulatory Visit (INDEPENDENT_AMBULATORY_CARE_PROVIDER_SITE_OTHER): Payer: BC Managed Care – PPO | Admitting: Family Medicine

## 2019-04-26 ENCOUNTER — Encounter: Payer: Self-pay | Admitting: Family Medicine

## 2019-04-26 DIAGNOSIS — M5136 Other intervertebral disc degeneration, lumbar region: Secondary | ICD-10-CM | POA: Diagnosis not present

## 2019-04-26 DIAGNOSIS — J302 Other seasonal allergic rhinitis: Secondary | ICD-10-CM

## 2019-04-26 DIAGNOSIS — M51369 Other intervertebral disc degeneration, lumbar region without mention of lumbar back pain or lower extremity pain: Secondary | ICD-10-CM

## 2019-04-26 DIAGNOSIS — G8929 Other chronic pain: Secondary | ICD-10-CM | POA: Diagnosis not present

## 2019-04-26 DIAGNOSIS — M5030 Other cervical disc degeneration, unspecified cervical region: Secondary | ICD-10-CM

## 2019-04-26 HISTORY — DX: Other intervertebral disc degeneration, lumbar region: M51.36

## 2019-04-26 HISTORY — DX: Other intervertebral disc degeneration, lumbar region without mention of lumbar back pain or lower extremity pain: M51.369

## 2019-04-26 HISTORY — DX: Other cervical disc degeneration, unspecified cervical region: M50.30

## 2019-04-26 MED ORDER — HYDROCODONE-ACETAMINOPHEN 7.5-325 MG/15ML PO SOLN: ORAL | 0 refills | Status: DC

## 2019-04-26 MED ORDER — LEVOCETIRIZINE DIHYDROCHLORIDE 5 MG PO TABS: 5.0000 mg | ORAL_TABLET | Freq: Every evening | ORAL | 5 refills | Status: DC

## 2019-04-26 NOTE — Assessment & Plan Note (Signed)
Need records from previous ortho Pain management pending

## 2019-04-26 NOTE — Assessment & Plan Note (Signed)
Pt has seen ortho in past Need records and referral for pain management is pending

## 2019-04-26 NOTE — Patient Instructions (Signed)

## 2019-04-26 NOTE — Progress Notes (Signed)
Virtual Visit via Video Note  I connected with [name] on 04/26/19 at 10:00 AM EST by a video enabled telemedicine application and verified that I am speaking with the correct person using two identifiers.  Location: Patient: home  Provider: office    I discussed the limitations of evaluation and management by telemedicine and the availability of in person appointments. The patient expressed understanding and agreed to proceed.  History of Present Illness: Pt is home needing refills and c/o con't nasal drainage.   He is using the antihistamine, and both nasal sprays   He has had 3 rounds of abx and is no better.  He also needs a refill on his pain meds.  Referral is pending   Observations/Objective:  122/78  p 78  Temp 98.6  Wt 188 lbs   Pt is in NAD   Assessment and Plan: 1. Seasonal allergies Will refer to allergy - Ambulatory referral to Allergy Change antihistamine con't nose sprays   Follow Up Instructions:    I discussed the assessment and treatment plan with the patient. The patient was provided an opportunity to ask questions and all were answered. The patient agreed with the plan and demonstrated an understanding of the instructions.   The patient was advised to call back or seek an in-person evaluation if the symptoms worsen or if the condition fails to improve as anticipated.  I provided 25 minutes of non-face-to-face time during this encounter.   Ann Held, DO

## 2019-04-29 ENCOUNTER — Telehealth: Payer: Self-pay

## 2019-04-29 DIAGNOSIS — E1165 Type 2 diabetes mellitus with hyperglycemia: Secondary | ICD-10-CM

## 2019-04-29 DIAGNOSIS — E1151 Type 2 diabetes mellitus with diabetic peripheral angiopathy without gangrene: Secondary | ICD-10-CM

## 2019-04-29 DIAGNOSIS — IMO0002 Reserved for concepts with insufficient information to code with codable children: Secondary | ICD-10-CM

## 2019-04-29 MED ORDER — SITAGLIPTIN PHOSPHATE 100 MG PO TABS: 100.0000 mg | ORAL_TABLET | Freq: Every day | ORAL | 2 refills | Status: DC

## 2019-04-29 NOTE — Telephone Encounter (Signed)
Spoke with [facility] [employer] call reference # [id number]  Januvia 100 MG tab I tab per day, quantity of 30 tab a month approved from today until 04/23/2020  [place] approval number [id number].  Updated RX sent to pharmacy.

## 2019-05-02 ENCOUNTER — Encounter: Payer: Self-pay | Admitting: Family Medicine

## 2019-05-02 ENCOUNTER — Other Ambulatory Visit: Payer: Self-pay | Admitting: Family Medicine

## 2019-05-02 DIAGNOSIS — G8929 Other chronic pain: Secondary | ICD-10-CM

## 2019-05-02 MED ORDER — HYDROCODONE-ACETAMINOPHEN 7.5-325 MG/15ML PO SOLN: ORAL | 0 refills | Status: DC

## 2019-05-02 NOTE — Telephone Encounter (Signed)
done

## 2019-05-03 ENCOUNTER — Ambulatory Visit: Payer: BLUE CROSS/BLUE SHIELD | Admitting: Pediatrics

## 2019-05-07 ENCOUNTER — Other Ambulatory Visit: Payer: Self-pay | Admitting: Family Medicine

## 2019-05-07 DIAGNOSIS — K219 Gastro-esophageal reflux disease without esophagitis: Secondary | ICD-10-CM

## 2019-05-09 ENCOUNTER — Ambulatory Visit (INDEPENDENT_AMBULATORY_CARE_PROVIDER_SITE_OTHER): Payer: BC Managed Care – PPO | Admitting: Allergy & Immunology

## 2019-05-09 ENCOUNTER — Encounter: Payer: Self-pay | Admitting: Allergy & Immunology

## 2019-05-09 ENCOUNTER — Other Ambulatory Visit: Payer: Self-pay

## 2019-05-09 VITALS — BP 110/70 | HR 92 | Temp 98.2°F | Resp 20 | Ht 69.2 in | Wt 182.0 lb

## 2019-05-09 DIAGNOSIS — L853 Xerosis cutis: Secondary | ICD-10-CM

## 2019-05-09 DIAGNOSIS — K9049 Malabsorption due to intolerance, not elsewhere classified: Secondary | ICD-10-CM

## 2019-05-09 DIAGNOSIS — J3089 Other allergic rhinitis: Secondary | ICD-10-CM

## 2019-05-09 DIAGNOSIS — J302 Other seasonal allergic rhinitis: Secondary | ICD-10-CM | POA: Diagnosis not present

## 2019-05-09 HISTORY — DX: Other seasonal allergic rhinitis: J30.2

## 2019-05-09 HISTORY — DX: Malabsorption due to intolerance, not elsewhere classified: K90.49

## 2019-05-09 HISTORY — DX: Other allergic rhinitis: J30.89

## 2019-05-09 HISTORY — DX: Xerosis cutis: L85.3

## 2019-05-09 MED ORDER — BUDESONIDE 0.5 MG/2ML IN SUSP: RESPIRATORY_TRACT | 5 refills | Status: DC

## 2019-05-09 MED ORDER — EPINEPHRINE 0.3 MG/0.3ML IJ SOAJ: INTRAMUSCULAR | 3 refills | Status: DC

## 2019-05-09 MED ORDER — LEVOCETIRIZINE DIHYDROCHLORIDE 5 MG PO TABS: 5.0000 mg | ORAL_TABLET | Freq: Every day | ORAL | 5 refills | Status: DC

## 2019-05-09 MED ORDER — AZELASTINE HCL 0.15 % NA SOLN: NASAL | 5 refills | Status: DC

## 2019-05-09 NOTE — Patient Instructions (Addendum)
1. Chronic rhinitis - Testing today showed: grasses, weeds, trees, indoor molds and outdoor molds - Copy of test results provided.  - Avoidance measures provided. - Stop taking: Nasonex - Continue with: Xyzal (levocetirizine) 5mg  tablet once daily and Astelin (azelastine) 2 sprays per nostril 1-2 times daily as needed - Start taking: budesonide nasal rinses twice daily (see recipe below) using a NeilMed bottle - You can use an extra dose of the antihistamine, if needed, for breakthrough symptoms.  - Consider nasal saline rinses 1-2 times daily to remove allergens from the nasal cavities as well as help with mucous clearance (this is especially helpful to do before the nasal sprays are given) - Consider allergy shots as a means of long-term control. - Allergy shots "re-train" and "reset" the immune system to ignore environmental allergens and decrease the resulting immune response to those allergens (sneezing, itchy watery eyes, runny nose, nasal congestion, etc).    - Allergy shots improve symptoms in 75-85% of patients.  - Call your insurance company to check on any copayments and call us back when you make a decision.   2. Food intolerance - Testing was negative to the most common foods, as well as chicken. - There is a the low positive predictive value of food allergy testing and hence the high possibility of false positives. - In contrast, food allergy testing has a high negative predictive value, therefore if testing is negative we can be relatively assured that they are indeed negative.  - This could be more of an intolerance rather than an allergy.   3. Return in about 10 weeks (around 07/18/2019). This can be an in-person, a virtual Webex or a telephone follow up visit.   Please inform us of any Emergency Department visits, hospitalizations, or changes in symptoms. Call us before going to the ED for breathing or allergy symptoms since we might be able to fit you in for a sick visit. Feel  free to contact us anytime with any questions, problems, or concerns.  It was a pleasure to meet you today!  Websites that have reliable patient information: 1. American Academy of Asthma, Allergy, and Immunology: www.aaaai.org 2. Food Allergy Research and Education (FARE): foodallergy.org 3. Mothers of Asthmatics: http://www.asthmacommunitynetwork.org 4. American College of Allergy, Asthma, and Immunology: www.acaai.org  Like [place] on [place] and Instagram for our latest updates!      Make sure you are registered to vote! If you have moved or changed any of your contact information, you will need to get this updated before voting!  In some cases, you MAY be able to register to vote online: [email or web address]    Budesonide (Pulmicort) + Saline Irrigation/Rinse   Budesonide (Pulmicort) is an anti-inflammatory steroid medication used to decrease nasal and sinus inflammation. It is dispensed in liquid form in a vial. Although it is manufactured for use with a nebulizer, we intend for you to use it with the NeilMed Sinus Rinse bottle (preferred) or a Neti pot.    Instructions:  1) Make 240 mL of saline in the NeilMed bottle using the salt packets or your own saline recipe (see separate handout).  2) Add the entire 2cc vial of liquid Budesonide (Pulmicort) to the rinse bottle and mix together.  3) While in the shower or over the sink, tilt your head forward to a comfortable level. Put the tip of the sinus rinse bottle in your nostril and aim it towards the crown or top of your head. Gently squeeze the bottle to flush  out your nose. The fluid will circulate in and out of your sinus cavities, coming back out from either nostril or through your mouth. Try not to swallow large quantities and spit it out instead.  4) Perform Budesonide (Pulmicort) + Saline irrigations 2 times daily.   Reducing Pollen Exposure  The American Academy of Allergy, Asthma and Immunology  suggests the following steps to reduce your exposure to pollen during allergy seasons.    1. Do not hang sheets or clothing out to dry; pollen may collect on these items. 2. Do not mow lawns or spend time around freshly cut grass; mowing stirs up pollen. 3. Keep windows closed at night.  Keep car windows closed while driving. 4. Minimize morning activities outdoors, a time when pollen counts are usually at their highest. 5. Stay indoors as much as possible when pollen counts or humidity is high and on windy days when pollen tends to remain in the air longer. 6. Use air conditioning when possible.  Many air conditioners have filters that trap the pollen spores. 7. Use a HEPA room air filter to remove pollen form the indoor air you breathe.  Control of Mold Allergen   Mold and fungi can grow on a variety of surfaces provided certain temperature and moisture conditions exist.  Outdoor molds grow on plants, decaying vegetation and soil.  The major outdoor mold, Alternaria and Cladosporium, are found in very high numbers during hot and dry conditions.  Generally, a late Summer - Fall peak is seen for common outdoor fungal spores.  Rain will temporarily lower outdoor mold spore count, but counts rise rapidly when the rainy period ends.  The most important indoor molds are Aspergillus and Penicillium.  Dark, humid and poorly ventilated basements are ideal sites for mold growth.  The next most common sites of mold growth are the bathroom and the kitchen.  Outdoor (Seasonal) Mold Control  Positive outdoor molds via skin testing: Alternaria, Cladosporium, Bipolaris (Helminthsporium), Drechslera (Curvalaria) and Mucor  1. Use air conditioning and keep windows closed 2. Avoid exposure to decaying vegetation. 3. Avoid leaf raking. 4. Avoid grain handling. 5. Consider wearing a face mask if working in moldy areas.  6.   Indoor (Perennial) Mold Control   Positive indoor molds via skin testing:  Aspergillus, Penicillium, Fusarium, Aureobasidium (Pullulara) and Rhizopus  1. Maintain humidity below 50%. 2. Clean washable surfaces with 5% bleach solution. 3. Remove sources e.g. contaminated carpets.     Allergy Shots   Allergies are the result of a chain reaction that starts in the immune system. Your immune system controls how your body defends itself. For instance, if you have an allergy to pollen, your immune system identifies pollen as an invader or allergen. Your immune system overreacts by producing antibodies called Immunoglobulin E (IgE). These antibodies travel to cells that release chemicals, causing an allergic reaction.  The concept behind allergy immunotherapy, whether it is received in the form of shots or tablets, is that the immune system can be desensitized to specific allergens that trigger allergy symptoms. Although it requires time and patience, the payback can be long-term relief.  How Do Allergy Shots Work?  Allergy shots work much like a vaccine. Your body responds to injected amounts of a particular allergen given in increasing doses, eventually developing a resistance and tolerance to it. Allergy shots can lead to decreased, minimal or no allergy symptoms.  There generally are two phases: build-up and maintenance. Build-up often ranges from three to six months  and involves receiving injections with increasing amounts of the allergens. The shots are typically given once or twice a week, though more rapid build-up schedules are sometimes used.  The maintenance phase begins when the most effective dose is reached. This dose is different for each person, depending on how allergic you are and your response to the build-up injections. Once the maintenance dose is reached, there are longer periods between injections, typically two to four weeks.  Occasionally doctors give cortisone-type shots that can temporarily reduce allergy symptoms. These types of shots are  different and should not be confused with allergy immunotherapy shots.  Who Can Be Treated with Allergy Shots?  Allergy shots may be a good treatment approach for people with allergic rhinitis (hay fever), allergic asthma, conjunctivitis (eye allergy) or stinging insect allergy.   Before deciding to begin allergy shots, you should consider:   The length of allergy season and the severity of your symptoms  Whether medications and/or changes to your environment can control your symptoms  Your desire to avoid long-term medication use  Time: allergy immunotherapy requires a major time commitment  Cost: may vary depending on your insurance coverage  Allergy shots for children age 22 and older are effective and often well tolerated. They might prevent the onset of new allergen sensitivities or the progression to asthma.  Allergy shots are not started on patients who are pregnant but can be continued on patients who become pregnant while receiving them. In some patients with other medical conditions or who take certain common medications, allergy shots may be of risk. It is important to mention other medications you talk to your allergist.   When Will I Feel Better?  Some may experience decreased allergy symptoms during the build-up phase. For others, it may take as long as 12 months on the maintenance dose. If there is no improvement after a year of maintenance, your allergist will discuss other treatment options with you.  If you arent responding to allergy shots, it may be because there is not enough dose of the allergen in your vaccine or there are missing allergens that were not identified during your allergy testing. Other reasons could be that there are high levels of the allergen in your environment or major exposure to non-allergic triggers like tobacco smoke.  What Is the Length of Treatment?  Once the maintenance dose is reached, allergy shots are generally continued for three  to five years. The decision to stop should be discussed with your allergist at that time. Some people may experience a permanent reduction of allergy symptoms. Others may relapse and a longer course of allergy shots can be considered.  What Are the Possible Reactions?  The two types of adverse reactions that can occur with allergy shots are local and systemic. Common local reactions include very mild redness and swelling at the injection site, which can happen immediately or several hours after. A systemic reaction, which is less common, affects the entire body or a particular body system. They are usually mild and typically respond quickly to medications. Signs include increased allergy symptoms such as sneezing, a stuffy nose or hives.  Rarely, a serious systemic reaction called anaphylaxis can develop. Symptoms include swelling in the throat, wheezing, a feeling of tightness in the chest, nausea or dizziness. Most serious systemic reactions develop within 30 minutes of allergy shots. This is why it is strongly recommended you wait in your doctors office for 30 minutes after your injections. Your allergist is trained to  watch for reactions, and his or her staff is trained and equipped with the proper medications to identify and treat them.  Who Should Administer Allergy Shots?  The preferred location for receiving shots is your prescribing allergists office. Injections can sometimes be given at another facility where the physician and staff are trained to recognize and treat reactions, and have received instructions by your prescribing allergist.

## 2019-05-09 NOTE — Progress Notes (Signed)
NEW PATIENT  Date of Service/Encounter:  05/09/19  Referring provider: Carollee Herter, Alferd Apa, DO   Assessment:   Seasonal and perennial allergic rhinitis (grasses, trees, weeds, indoor and outdoor molds) - wishing to initiate allergen immunotherapy  Food intolerance - with negative testing to the most common foods as well as chicken  Septal deviation - followed by Dr. Wilburn Cornelia  Complicated past medical history, including strokes, aphasia, and chronic pain  Plan/Recommendations:   1. Chronic rhinitis - Testing today showed: grasses, weeds, trees, indoor molds and outdoor molds - Copy of test results provided.  - Avoidance measures provided. - Stop taking: Nasonex - Continue with: Xyzal (levocetirizine) 5mg  tablet once daily and Astelin (azelastine) 2 sprays per nostril 1-2 times daily as needed - Start taking: budesonide nasal rinses twice daily (see recipe below) using a NeilMed bottle - You can use an extra dose of the antihistamine, if needed, for breakthrough symptoms.  - Consider nasal saline rinses 1-2 times daily to remove allergens from the nasal cavities as well as help with mucous clearance (this is especially helpful to do before the nasal sprays are given) - Consider allergy shots as a means of long-term control. - Allergy shots "re-train" and "reset" the immune system to ignore environmental allergens and decrease the resulting immune response to those allergens (sneezing, itchy watery eyes, runny nose, nasal congestion, etc).    - Allergy shots improve symptoms in 75-85% of patients.  - Call your insurance company to check on any copayments and call us back when you make a decision.   2. Food intolerance - Testing was negative to the most common foods, as well as chicken. - There is a the low positive predictive value of food allergy testing and hence the high possibility of false positives. - In contrast, food allergy testing has a high negative predictive  value, therefore if testing is negative we can be relatively assured that they are indeed negative.  - This could be more of an intolerance rather than an allergy.   3. Return in about 10 weeks (around 07/18/2019). This can be an in-person, a virtual Webex or a telephone follow up visit.   Subjective:   Chase [name] is a 58 y.o. male presenting today for evaluation of  Chief Complaint  Patient presents with  . Nasal Congestion    ear aches, headaches, runny nose.  Runny nose when pt eats.    Chase [name] has a history of the following: Patient Active Problem List   Diagnosis Date Noted  . Dry skin 05/09/2019  . Seasonal and perennial allergic rhinitis 05/09/2019  . Food intolerance 05/09/2019  . DDD (degenerative disc disease), cervical 04/26/2019  . DDD (degenerative disc disease), lumbar 04/26/2019  . Chronic pansinusitis 06/30/2018  . Deviated septum 05/31/2018  . Nasal turbinate hypertrophy 05/31/2018  . Insect bite 03/16/2018  . 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 Excessive daytime sleepiness 02/11/2018  . Sleep apnea with use of continuous positive airway pressure (CPAP) 02/11/2018  . 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 Nocturia more than twice per night 02/11/2018  . Bilateral leg and foot pain 03/02/2017  . Fibromyalgia 03/02/2017  . Localized swelling of both lower legs 03/02/2017  . Other chronic pain 03/02/2017  . Raynaud's disease 03/02/2017  . Raynaud's phenomenon 03/02/2017  . Pain in joint of right shoulder 02/21/2017  . DM (diabetes mellitus) type II uncontrolled, periph vascular disorder ([place]) 01/02/2016  . Right shoulder  pain 02/15/2015  . Rectal bleeding 02/12/2015  . Rectal ulcer with bleeding after hemorrhoid banding 02/11/2015  . Lower GI bleed   . Blood in stool, frank 02/10/2015  . Anemia 02/10/2015  . Acute renal insufficiency 02/10/2015  . Hemorrhoids 02/10/2015  . GI bleed 02/10/2015  . Acute  GI bleeding   . Stroke ([place]) 01/26/2015  . Palpitations 01/26/2015  . Essential hypertension 01/26/2015  . Hyperlipidemia LDL goal <70 01/26/2015  . Cerebrovascular accident, old 11/16/2014  . Internal hemorrhoids 10/22/2014  . 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 OSA on CPAP 10/17/2014  . Persistent headaches 10/17/2014  . Subacute confusional state 10/17/2014  . Persistent atrial fibrillation ([place]) 10/17/2014  . Chronic cough 08/27/2014  . Sinus infection 06/30/2014  . Sinusitis, acute, maxillary 02/02/2014  . 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  . Prolapsed hemorrhoids 10/05/2013  . Hemangioma 02/12/2013  . Decreased hearing of both ears 02/12/2013  . Abnormality of gait 11/10/2012  . Hypersomnia, persistent 11/05/2012  . Unspecified sleep apnea 11/05/2012  . Pain in finger of right hand 10/06/2012  . Internal and external hemorrhoids without complication 99991111  . Varicose veins of lower extremities with other complications [id number]  . Acute upper respiratory infection 08/10/2012  . Bigeminy ? 03/22/2012  . Vertigo 03/19/2012  . Annual physical exam 11/26/2011  . Sleep apnea 11/03/2011  . Insomnia 08/05/2011  . Difficulty urinating   . Hemorrhoid 05/22/2011  . Dysuria 11/29/2010  . Arthralgia 07/25/2010  . Paresthesia 07/25/2010  . HTN (hypertension) 02/04/2010  . ANXIETY DEPRESSION 12/09/2007  . GERD 12/09/2007  . FATIGUE 12/09/2007  . 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 BACK PAIN 07/20/2007  . SYMPTOM, HYPERSOMNIA NOS 02/19/2007  . Rhinitis, chronic 12/23/2006  . HEADACHE 12/23/2006  . NEPHROLITHIASIS 08/13/2006    History obtained from: chart review and patient.  Chase [name] was referred by Carollee Herter, Alferd Apa, DO.     Chase [name] is a 58 y.o. male presenting for an evaluation of chronic rhinitis.   Allergic Rhinitis Symptom History: He reports that he has multiple sinus infections. He does not get much  relief. He gets antibiotics around 4-5 times per year. It has definitely gotten worse over the course of time. He does think that he has tolerated it for years but it has worsened. HE reports headache and ear aches and pressure. He has difficult breathing. He does have a CPAP and continues to report difficulty breathing. He does use HEPA filters and still has problems with breathing. He has problems with nose fullness. He does think that this gets better during a certain time of the year. He has been allergy tested tested around 30 years ago. He does not recall whether he was on allergy shots.   He does have a history of a deviated septum but this was never corrected due to insurance coverage. He is disabled currently, but he was working at [employer] before he was disabled. It was a dusty and smokey environment. This was 2008 when he left there. 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   He is on Astelin and Nasonex. These do not seem to provide much relief at all. He has wondered whether some else might work better. He was switched from Claritin to Xyzal to see if this would work better but he had to stop after three days due to this appointment. He last saw Dr.  Shoemaker in January 2020 when a bilateral limited endoscopic sinus surgery, nasal septoplasty and inferior turbinate reduction was recommended. Unfortunately, this was the surgery thatr was not approved by his insurance.   Asthma/COPD Symptom History: He has been diagnosed with COPD at one point. He did see Pulmonology for a chronic cough in 2016. He does not use an inhalers at this point. He seems confused when I never ask him about it. Review of the Pulmonology note from 2016 does not note any inhaler use at all and does not mention COPD.    Food Allergy Symptom History: He does report that he has symptoms when he eats pancakes. He tells me that it "sets [his] sinuses off", but it  does clear it up. Chicken might do a similar thing to his body. He does not eat nuts very often. He has never required epinephrine for any of his reactions. They resolve after the meals fairly quickly.   He did have pneumonia once but he gets pneumonia shots now. He does get the sinus infection 5-6 times per year. He has never been evaluated for an immunodeficiency.   Otherwise, there is no history of other atopic diseases, including drug allergies, stinging insect allergies, urticaria or contact dermatitis. There is no significant infectious history. Vaccinations are up to date.   Sinus CT (December 2019): Mild mucosal edema in the frontal, maxillary and ethmoid sinuses bilaterally. Ostiomeatal complex patent bilaterally. No air-fluid level.   Past Medical History: Patient Active Problem List   Diagnosis Date Noted  . Dry skin 05/09/2019  . Seasonal and perennial allergic rhinitis 05/09/2019  . Food intolerance 05/09/2019  . DDD (degenerative disc disease), cervical 04/26/2019  . DDD (degenerative disc disease), lumbar 04/26/2019  . Chronic pansinusitis 06/30/2018  . Deviated septum 05/31/2018  . Nasal turbinate hypertrophy 05/31/2018  . Insect bite 03/16/2018  . 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 Excessive daytime sleepiness 02/11/2018  . Sleep apnea with use of continuous positive airway pressure (CPAP) 02/11/2018  . 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 Nocturia more than twice per night 02/11/2018  . Bilateral leg and foot pain 03/02/2017  . Fibromyalgia 03/02/2017  . Localized swelling of both lower legs 03/02/2017  . Other chronic pain 03/02/2017  . Raynaud's disease 03/02/2017  . Raynaud's phenomenon 03/02/2017  . Pain in joint of right shoulder 02/21/2017  . DM (diabetes mellitus) type II uncontrolled, periph vascular disorder ([place]) 01/02/2016  . Right shoulder pain 02/15/2015  . Rectal bleeding 02/12/2015  . Rectal ulcer with  bleeding after hemorrhoid banding 02/11/2015  . Lower GI bleed   . Blood in stool, frank 02/10/2015  . Anemia 02/10/2015  . Acute renal insufficiency 02/10/2015  . Hemorrhoids 02/10/2015  . GI bleed 02/10/2015  . Acute GI bleeding   . Stroke ([place]) 01/26/2015  . Palpitations 01/26/2015  . Essential hypertension 01/26/2015  . Hyperlipidemia LDL goal <70 01/26/2015  . Cerebrovascular accident, old 11/16/2014  . Internal hemorrhoids 10/22/2014  . 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 OSA on CPAP 10/17/2014  . Persistent headaches 10/17/2014  . Subacute confusional state 10/17/2014  . Persistent atrial fibrillation ([place]) 10/17/2014  . Chronic cough 08/27/2014  . Sinus infection 06/30/2014  . Sinusitis, acute, maxillary 02/02/2014  . 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  . Prolapsed hemorrhoids 10/05/2013  . Hemangioma 02/12/2013  . Decreased hearing of both ears 02/12/2013  . Abnormality of gait 11/10/2012  .  Hypersomnia, persistent 11/05/2012  . Unspecified sleep apnea 11/05/2012  . Pain in finger of right hand 10/06/2012  . Internal and external hemorrhoids without complication 99991111  . Varicose veins of lower extremities with other complications [id number]  . Acute upper respiratory infection 08/10/2012  . Bigeminy ? 03/22/2012  . Vertigo 03/19/2012  . Annual physical exam 11/26/2011  . Sleep apnea 11/03/2011  . Insomnia 08/05/2011  . Difficulty urinating   . Hemorrhoid 05/22/2011  . Dysuria 11/29/2010  . Arthralgia 07/25/2010  . Paresthesia 07/25/2010  . HTN (hypertension) 02/04/2010  . ANXIETY DEPRESSION 12/09/2007  . GERD 12/09/2007  . FATIGUE 12/09/2007  . 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 BACK PAIN 07/20/2007  . SYMPTOM, HYPERSOMNIA NOS 02/19/2007  . Rhinitis, chronic 12/23/2006  . HEADACHE 12/23/2006  . NEPHROLITHIASIS 08/13/2006    Medication List:  Allergies as of 05/09/2019      Reactions   Amoxicillin  Hives, Itching, Other (See Comments)   White tongue; ? hives   Fluticasone    Other reaction(s): Other Sores inside nose   Sulfa Antibiotics Rash   Flonase [fluticasone Propionate]    Nasal Sores   Terfenadine    ? reaction      Medication List       Accurate as of May 09, 2019  3:49 PM. If you have any questions, ask your nurse or doctor.        Advil 200 MG tablet Generic drug: ibuprofen   AIRBORNE GUMMIES PO Take 1 each by mouth daily.   allopurinol 100 MG tablet Commonly known as: ZYLOPRIM Take 1 tablet (100 mg total) by mouth daily.   amLODipine 5 MG tablet Commonly known as: NORVASC TAKE 1 TABLET BY MOUTH EVERY DAY   Azelastine HCl 0.15 % Soln PLACE 2 SPRAYS IN EACH NOSTRIL every night   Bayer Microlet Lancets lancets Use 1x a day   Benadryl 25 MG tablet Generic drug: diphenhydrAMINE   betamethasone dipropionate 0.05 % ointment Commonly known as: DIPROLENE Apply topically 2 (two) times daily.   Contour Next Test test strip Generic drug: glucose blood USE ONCE DAILY - E11.51, E11.65 DISPENSE CONTOUR NEXT STRIPS   cyclobenzaprine 10 MG tablet Commonly known as: FLEXERIL TAKE 1 TABLET BY MOUTH AT BEDTIME AS NEEDED FOR MUSCLE SPASMS   doxycycline 100 MG tablet Commonly known as: VIBRA-TABS Take 1 tablet (100 mg total) by mouth 2 (two) times daily.   empagliflozin 25 MG Tabs tablet Commonly known as: Jardiance Take 25 mg by mouth daily.   famotidine 40 MG/5ML suspension Commonly known as: PEPCID TAKE 2.5 MLS (20 MG TOTAL) BY MOUTH DAILY.   FIBER SELECT GUMMIES PO Take 1 tablet by mouth daily.   finasteride 5 MG tablet Commonly known as: PROSCAR Take 5 mg by mouth daily.   FreeStyle Libre 14 Day Reader Kerrin Mo 1 Device by Does not apply route every 14 (fourteen) days.   FreeStyle Libre 14 Day Sensor Misc 1 each by Subdermal route every 14 (fourteen) days.   gabapentin 100 MG capsule Commonly known as: NEURONTIN TAKE 1 CAPSULE BY MOUTH  THREE TIMES A DAY   gentamicin cream 0.1 % Commonly known as: GARAMYCIN Apply 1 application topically 3 (three) times daily.   guaiFENesin 200 MG/10ML Soln   HYDROcodone-acetaminophen 7.5-325 mg/15 ml solution Commonly known as: HYCET TAKE 20 ML EVERY 6 HOURS AS NEEDED FOR PAIN   hydrOXYzine 25 MG tablet Commonly known as: ATARAX/VISTARIL TAKE 1 TABLET BY MOUTH EVERY 8 HOURS AS NEEDED FOR ANXIETY  OR ITCHING   levocetirizine 5 MG tablet Commonly known as: Xyzal Take 1 tablet (5 mg total) by mouth every evening.   MELATONIN PO 7 mg at bedtime.   metoprolol tartrate 25 MG tablet Commonly known as: LOPRESSOR 1 po bid   mometasone 0.1 % ointment Commonly known as: ELOCON APPLY TO AFFECTED AREA EVERY DAY AS NEEDED FOR RASH   mometasone 50 MCG/ACT nasal spray Commonly known as: NASONEX Place 2 sprays into the nose daily.   mometasone 50 MCG/ACT nasal spray Commonly known as: Nasonex Place 2 sprays into the nose daily.   NASACORT ALLERGY 24HR NA Place into the nose.   triamcinolone 55 MCG/ACT Aero nasal inhaler Commonly known as: NASACORT Place 2 sprays into the nose daily.   NONFORMULARY OR COMPOUNDED ITEM Compression socks  #1   15-20 mm/hg  Dx edema   ondansetron 4 MG disintegrating tablet Commonly known as: ZOFRAN-ODT USE UP TO THREE TIMES A DAY AS NEEDED FOR NAUSEA.   polyethylene glycol powder 17 GM/SCOOP powder Commonly known as: GLYCOLAX/MIRALAX TAKE 17 GRAMS BY MOUTH 2 TIMES DAILY AS NEEDED.   pravastatin 20 MG tablet Commonly known as: PRAVACHOL TAKE 1 TABLET BY MOUTH EVERY DAY   Proctofoam HC rectal foam Generic drug: hydrocortisone-pramoxine APPLY FOAM THREE TIMES A DAY AS NEEDED FOR 7 DAYS   promethazine-dextromethorphan 6.25-15 MG/5ML syrup Commonly known as: PROMETHAZINE-DM Take 5 mLs by mouth 4 (four) times daily as needed.   sitaGLIPtin 100 MG tablet Commonly known as: Januvia Take 1 tablet (100 mg total) by mouth daily.   terazosin 5  MG capsule Commonly known as: HYTRIN Take by mouth daily.   triamcinolone cream 0.1 % Commonly known as: KENALOG Apply 1 application topically 2 (two) times daily.   Tylenol PM Extra Strength 50-1000 MG/30ML Liqd Generic drug: diphenhydrAMINE-APAP (sleep)   venlafaxine XR 75 MG 24 hr capsule Commonly known as: EFFEXOR-XR TAKE 1 CAPSULE (75 MG TOTAL) BY MOUTH DAILY WITH BREAKFAST.   Vicks DayQuil Severe Cold/Flu 5-10-200-325 MG/15ML Liqd Generic drug: Phenylephrine-DM-GG-APAP       Birth History: non-contributory  Developmental History: non-contributory  Past Surgical History: Past Surgical History:  Procedure Laterality Date  . CATARACT EXTRACTION     x 3  . [place]   cataracts  . FLEXIBLE SIGMOIDOSCOPY N/A 02/11/2015   Procedure: FLEXIBLE SIGMOIDOSCOPY;  Surgeon: Gatha Mayer, MD;  Location: [facility];  Service: Endoscopy;  Laterality: N/A;  . INNER EAR SURGERY     rt ear  . INNER EAR SURGERY  1992  . surgery - right arm     1983     Family History: Family History  Problem Relation Age of Onset  . Breast cancer Mother 62       breast  . Huntington's disease Mother   . Stroke Father   . Heart disease Father   . Hypertension Father   . Heart attack Father   . Hypertension Brother   . Hyperlipidemia Neg Hx   . Diabetes Neg Hx      Social History: Sujan lives at home with his girlfriend.  They live in a house that was built in the 1960s.  There is carpeting throughout the home.  They have electric heating with a heat pump as well as a heat pump for cooling.  There are no pets in the home.  There are no dust mite coverings on the bedding.  There is no tobacco exposure.   Review of Systems  Constitutional: Negative.  Negative for chills, fever, malaise/fatigue and weight loss.  HENT: Positive for congestion, hearing loss and sinus pain. Negative for ear discharge and ear pain.        Positive for ear fullness.  Eyes: Negative for  pain, discharge and redness.  Respiratory: Negative for cough, sputum production, shortness of breath and wheezing.   Cardiovascular: Negative.  Negative for chest pain and palpitations.  Gastrointestinal: Negative for abdominal pain, constipation, diarrhea, heartburn, nausea and vomiting.  Skin: Negative.  Negative for itching and rash.  Neurological: Negative for dizziness and headaches.       Positive for chronic pain.   Endo/Heme/Allergies: Negative for environmental allergies. Does not bruise/bleed easily.       Objective:   Blood pressure 110/70, pulse 92, temperature 98.2 F (36.8 C), temperature source Oral, resp. rate 20, height 5' 9.2" (1.758 m), weight 182 lb (82.6 kg), SpO2 97 %. Body mass index is 26.72 kg/m.   Physical Exam:   Physical Exam  Constitutional: He appears well-developed.  Slow to respond to questions. Friendly, however.   HENT:  Head: Normocephalic and atraumatic.  Right Ear: Tympanic membrane, external ear and ear canal normal. No drainage, swelling or tenderness. Tympanic membrane is not injected, not scarred, not erythematous, not retracted and not bulging.  Left Ear: Tympanic membrane, external ear and ear canal normal. No drainage, swelling or tenderness. Tympanic membrane is not injected, not scarred, not erythematous, not retracted and not bulging.  Nose: Mucosal edema and rhinorrhea present. No nasal deformity or septal deviation. No epistaxis. Right sinus exhibits no maxillary sinus tenderness and no frontal sinus tenderness. Left sinus exhibits no maxillary sinus tenderness and no frontal sinus tenderness.  Mouth/Throat: Uvula is midline and oropharynx is clear and moist. Mucous membranes are not pale and not dry.  Tonsils unremarkable.   Eyes: Pupils are equal, round, and reactive to light. Conjunctivae and EOM are normal. Right eye exhibits no chemosis and no discharge. Left eye exhibits no chemosis and no discharge. Right conjunctiva is not  injected. Left conjunctiva is not injected.  Cardiovascular: Normal rate, regular rhythm and normal heart sounds.  Respiratory: Effort normal and breath sounds normal. No accessory muscle usage. No tachypnea. No respiratory distress. He has no wheezes. He has no rhonchi. He has no rales. He exhibits no tenderness.  GI: There is no abdominal tenderness. There is no rebound and no guarding.  Lymphadenopathy:       Head (right side): No submandibular, no tonsillar and no occipital adenopathy present.       Head (left side): No submandibular, no tonsillar and no occipital adenopathy present.    He has no cervical adenopathy.  Neurological: He is alert.  Skin: No abrasion, no petechiae and no rash noted. Rash is not papular, not vesicular and not urticarial. No erythema. No pallor.  Psychiatric: He has a normal mood and affect.     Diagnostic studies:    Allergy Studies:    Airborne Adult Perc - 05/09/19 1426    Time Antigen Placed  1426    Allergen Manufacturer  Lavella Hammock    Location  Back    Number of Test  59    Panel 1  Select    1. Control-Buffer 50% Glycerol  Negative    2. Control-Histamine 1 mg/ml  3+    3. Albumin saline  Negative    4. [place]  Negative    5. [place]  Negative    6. [place]  Negative  7. [facility]  Negative    8. Meadow Fescue  Negative    9. Perennial Rye  Negative    10. Sweet Vernal  Negative    11. Timothy  Negative    12. Cocklebur  Negative    13. Burweed [place]er  Negative    14. Ragweed, short  Negative    15. Ragweed, Giant  Negative    16. Plantain,  English  Negative    17. Lamb's Quarters  Negative    18. Sheep Sorrell  Negative    19. Rough Pigweed  Negative    20. Marsh Elder, Rough  Negative    21. Mugwort, Common  Negative    22. Ash mix  Negative    23. Birch mix  Negative    24. Beech American  Negative    25. Box, Elder  Negative    26. Cedar, red  Negative    27. Cottonwood, [place]  Negative    28. Elm mix  Negative     29. Hickory mix  Negative    30. Maple mix  Negative    31. Oak, [place] mix  Negative    32. Pecan Pollen  Negative    33. Pine mix  Negative    34. Sycamore Eastern  Negative    35. [place], Black Pollen  Negative    36. Alternaria alternata  Negative    37. Cladosporium Herbarum  Negative    38. Aspergillus mix  Negative    39. Penicillium mix  Negative    40. Bipolaris sorokiniana (Helminthosporium)  Negative    41. Drechslera spicifera (Curvularia)  Negative    42. Mucor plumbeus  Negative    43. Fusarium moniliforme  Negative    44. Aureobasidium pullulans (pullulara)  Negative    45. Rhizopus oryzae  Negative    46. Botrytis cinera  Negative    47. Epicoccum nigrum  Negative    48. Phoma betae  Negative    49. Candida Albicans  Negative    50. Trichophyton mentagrophytes  Negative    51. Mite, D Farinae  5,000 AU/ml  Negative    52. Mite, D Pteronyssinus  5,000 AU/ml  Negative    53. Cat Hair 10,000 BAU/ml  Negative    54.  Dog Epithelia  Negative    55. Mixed Feathers  Negative    56. Horse Epithelia  Negative    57. Cockroach, German  Negative    58. Mouse  Negative    59. Tobacco Leaf  Negative     Food Perc - 05/09/19 1427    Time Antigen Placed  1427    Allergen Manufacturer  Lavella Hammock    Location  Back    Number of allergen test  10    Food  Select    1. Peanut  Negative    2. Soybean food  Negative    3. Wheat, whole  Negative    4. Sesame  Negative    5. Milk, cow  Negative    6. Egg White, chicken  Negative    7. Casein  Negative    8. Shellfish mix  Negative    9. Fish mix  Negative    10. Cashew  Negative     Intradermal - 05/09/19 1452    Time Antigen Placed  1452    Allergen Manufacturer  Lavella Hammock    Location  Arm    Number of Test  15    Intradermal  Select    Control  Negative    [place]  Negative    Johnson  2+    7 Grass  Negative    Ragweed mix  Negative    Weed mix  1+    Tree mix  1+    Mold 1  2+    Mold 2  1+    Mold 3  3+    Mold  4  3+    Cat  Negative    Dog  Negative    Cockroach  Negative    Mite mix  Negative     Food Adult Perc - 05/09/19 1400    Time Antigen Placed  1427    Allergen Manufacturer  [place]    Location  Back    Number of allergen test  1    39. Chicken Meat  Negative       Allergy testing results were read and interpreted by myself, documented by clinical staff.         Salvatore Marvel, MD [facility]

## 2019-05-17 NOTE — Progress Notes (Signed)
VIALS EXP 05-17-20 

## 2019-05-18 DIAGNOSIS — J301 Allergic rhinitis due to pollen: Secondary | ICD-10-CM | POA: Diagnosis not present

## 2019-05-19 ENCOUNTER — Other Ambulatory Visit: Payer: Self-pay | Admitting: Family Medicine

## 2019-05-19 ENCOUNTER — Other Ambulatory Visit: Payer: Self-pay | Admitting: Internal Medicine

## 2019-05-19 DIAGNOSIS — G8929 Other chronic pain: Secondary | ICD-10-CM

## 2019-05-19 DIAGNOSIS — J3089 Other allergic rhinitis: Secondary | ICD-10-CM | POA: Diagnosis not present

## 2019-05-20 NOTE — Telephone Encounter (Signed)
Last written: 05/02/19 Last ov: 04/26/19 Next ov: nothing scheduled Contract: due UDS: due

## 2019-05-23 ENCOUNTER — Encounter: Payer: Self-pay | Admitting: Family Medicine

## 2019-05-23 ENCOUNTER — Other Ambulatory Visit: Payer: Self-pay | Admitting: Family Medicine

## 2019-05-23 DIAGNOSIS — M25511 Pain in right shoulder: Secondary | ICD-10-CM

## 2019-05-23 NOTE — Telephone Encounter (Signed)
Pt has appointment for med refill/UDS/CSC on 05/24/2019

## 2019-05-23 NOTE — Telephone Encounter (Signed)
Need uds and contract What happened with pain referral ? Request is a little early

## 2019-05-23 NOTE — Telephone Encounter (Signed)
Requesting: Flexeril 10mg  Next Visit:05/24/2019 Last Refill: 03/31/2019, #30 w/1 RF  FYI--Pt has in person appt on 05/24/2019 for pain med refill/CSC/UDS  Please Advise

## 2019-05-24 ENCOUNTER — Other Ambulatory Visit: Payer: Self-pay

## 2019-05-24 ENCOUNTER — Other Ambulatory Visit: Payer: Self-pay | Admitting: Family Medicine

## 2019-05-24 ENCOUNTER — Encounter: Payer: Self-pay | Admitting: Family Medicine

## 2019-05-24 ENCOUNTER — Ambulatory Visit (INDEPENDENT_AMBULATORY_CARE_PROVIDER_SITE_OTHER): Payer: BC Managed Care – PPO | Admitting: Family Medicine

## 2019-05-24 ENCOUNTER — Telehealth: Payer: Self-pay | Admitting: Family Medicine

## 2019-05-24 VITALS — BP 110/70 | HR 107 | Temp 98.0°F | Resp 18 | Ht 69.2 in | Wt 184.8 lb

## 2019-05-24 DIAGNOSIS — Z8679 Personal history of other diseases of the circulatory system: Secondary | ICD-10-CM | POA: Diagnosis not present

## 2019-05-24 DIAGNOSIS — I4819 Other persistent atrial fibrillation: Secondary | ICD-10-CM | POA: Diagnosis not present

## 2019-05-24 DIAGNOSIS — G8929 Other chronic pain: Secondary | ICD-10-CM

## 2019-05-24 DIAGNOSIS — E1165 Type 2 diabetes mellitus with hyperglycemia: Secondary | ICD-10-CM | POA: Diagnosis not present

## 2019-05-24 DIAGNOSIS — R21 Rash and other nonspecific skin eruption: Secondary | ICD-10-CM | POA: Insufficient documentation

## 2019-05-24 DIAGNOSIS — E785 Hyperlipidemia, unspecified: Secondary | ICD-10-CM

## 2019-05-24 DIAGNOSIS — I10 Essential (primary) hypertension: Secondary | ICD-10-CM | POA: Diagnosis not present

## 2019-05-24 DIAGNOSIS — D332 Benign neoplasm of brain, unspecified: Secondary | ICD-10-CM

## 2019-05-24 DIAGNOSIS — R002 Palpitations: Secondary | ICD-10-CM

## 2019-05-24 DIAGNOSIS — E1151 Type 2 diabetes mellitus with diabetic peripheral angiopathy without gangrene: Secondary | ICD-10-CM | POA: Diagnosis not present

## 2019-05-24 DIAGNOSIS — IMO0002 Reserved for concepts with insufficient information to code with codable children: Secondary | ICD-10-CM

## 2019-05-24 DIAGNOSIS — E1169 Type 2 diabetes mellitus with other specified complication: Secondary | ICD-10-CM

## 2019-05-24 DIAGNOSIS — M5440 Lumbago with sciatica, unspecified side: Secondary | ICD-10-CM

## 2019-05-24 HISTORY — DX: Rash and other nonspecific skin eruption: R21

## 2019-05-24 HISTORY — DX: Benign neoplasm of brain, unspecified: D33.2

## 2019-05-24 MED ORDER — CLOTRIMAZOLE-BETAMETHASONE 1-0.05 % EX CREA: 1.0000 "application " | TOPICAL_CREAM | Freq: Two times a day (BID) | CUTANEOUS | 0 refills | Status: DC

## 2019-05-24 MED ORDER — HYDROCODONE-ACETAMINOPHEN 7.5-325 MG/15ML PO SOLN: ORAL | 0 refills | Status: DC

## 2019-05-24 NOTE — Progress Notes (Signed)
Patient ID: Chase [name], male    DOB: Sep 23, 1960  Age: 58 y.o. MRN: [id number]    Subjective:  Subjective  HPI Chase [name] presents for f/u chronic pain.  We don't have any records from ortho.  Pain management wanted them --- we will need to get the records from dr Louanne Skye  Pt also c/o palpitations lately --- he states he had a hx of afib but it has not bothered him until recently --- he has not seen cardiology in a long time.   No chest pain or sob.   Pt also need f/u dm, chol and bp.  He has not seen endo in a long time either  Review of Systems  Constitutional: Negative for appetite change, diaphoresis, fatigue and unexpected weight change.  Eyes: Negative for pain, redness and visual disturbance.  Respiratory: Negative for cough, chest tightness, shortness of breath and wheezing.   Cardiovascular: Positive for palpitations. Negative for chest pain and leg swelling.  Endocrine: Negative for cold intolerance, heat intolerance, polydipsia, polyphagia and polyuria.  Genitourinary: Negative for difficulty urinating, dysuria and frequency.  Musculoskeletal: Positive for arthralgias and back pain.  Skin: Positive for rash.  Neurological: Negative for dizziness, light-headedness, numbness and headaches.    History Past Medical History:  Diagnosis Date  . Allergy   . Arthritis   . Blood in stool   . Brain cyst   . Chest pain   . Constipation   . Diabetes mellitus without complication ([place])   . Difficulty urinating   . GERD (gastroesophageal reflux disease)   . Headache(784.0)   . Hearing loss   . Hyperlipidemia   . Hypertension   . Leg swelling   . Nasal congestion   . PONV (postoperative nausea and vomiting)   . Rectal pain   . Rectal ulcer with bleeding after hemorrhoid banding 02/11/2015  . Sleep apnea   . Stroke ([place])   . Stroke ([place])   . Thrombosed hemorrhoids   . Trouble swallowing     He has a past surgical history that includes Cataract extraction; Inner ear  surgery; Eye surgery ([place]); Inner ear surgery (1992); surgery - right arm; and Flexible sigmoidoscopy (N/A, 02/11/2015).   His family history includes Breast cancer (age of onset: 30) in his mother; Heart attack in his father; Heart disease in his father; Huntington's disease in his mother; Hypertension in his brother and father; Stroke in his father.He reports that he has never smoked. He has never used smokeless tobacco. He reports that he does not drink alcohol or use drugs.  Current Outpatient Medications on File Prior to Visit  Medication Sig Dispense Refill  . allopurinol (ZYLOPRIM) 100 MG tablet Take 1 tablet (100 mg total) by mouth daily. 90 tablet 1  . amLODipine (NORVASC) 5 MG tablet TAKE 1 TABLET BY MOUTH EVERY DAY 90 tablet 1  . Azelastine HCl 0.15 % SOLN Two sprays each nostril 1-2 times a day as needed. 30 mL 5  . BAYER MICROLET LANCETS lancets Use 1x a day 100 each 4  . betamethasone dipropionate (DIPROLENE) 0.05 % ointment Apply topically 2 (two) times daily. 30 g 0  . budesonide (PULMICORT) 0.5 MG/2ML nebulizer solution Mix with 240 cc saline in the [facility] bottle as directed and rinse sinuses twice a day. 120 mL 5  . Continuous Blood Gluc Receiver (FREESTYLE LIBRE 14 DAY READER) DEVI 1 Device by Does not apply route every 14 (fourteen) days. 1 Device 0  . Continuous Blood  Gluc Sensor (FREESTYLE LIBRE 14 DAY SENSOR) MISC 1 each by Subdermal route every 14 (fourteen) days. 6 each 2  . CONTOUR NEXT TEST test strip USE ONCE DAILY - E11.51, E11.65 DISPENSE CONTOUR NEXT STRIPS 100 strip 3  . cyclobenzaprine (FLEXERIL) 10 MG tablet TAKE 1 TABLET BY MOUTH AT BEDTIME AS NEEDED FOR MUSCLE SPASMS 30 tablet 1  . diphenhydrAMINE (BENADRYL) 25 MG tablet     . diphenhydrAMINE-APAP, sleep, (TYLENOL PM EXTRA STRENGTH) 50-1000 MG/30ML LIQD     . EPINEPHrine 0.3 mg/0.3 mL IJ SOAJ injection Use as directed for severe allergic reaction. 2 each 3  . famotidine (PEPCID) 40 MG/5ML suspension  TAKE 2.5 MLS (20 MG TOTAL) BY MOUTH DAILY. 100 mL 2  . FIBER SELECT GUMMIES PO Take 1 tablet by mouth daily.    . finasteride (PROSCAR) 5 MG tablet Take 5 mg by mouth daily.    Marland Kitchen gabapentin (NEURONTIN) 100 MG capsule TAKE 1 CAPSULE BY MOUTH THREE TIMES A DAY 90 capsule 3  . gentamicin cream (GARAMYCIN) 0.1 % Apply 1 application topically 3 (three) times daily. 30 g 1  . GuaiFENesin 200 MG/10ML SOLN     . hydrOXYzine (ATARAX/VISTARIL) 25 MG tablet TAKE 1 TABLET BY MOUTH EVERY 8 HOURS AS NEEDED FOR ANXIETY OR ITCHING 90 tablet 0  . ibuprofen (ADVIL) 200 MG tablet     . JARDIANCE 25 MG TABS tablet TAKE 1 TABLET BY MOUTH DAILY 90 tablet 3  . levocetirizine (XYZAL) 5 MG tablet Take 1 tablet (5 mg total) by mouth daily. 30 tablet 5  . MELATONIN PO 7 mg at bedtime.    . metoprolol tartrate (LOPRESSOR) 25 MG tablet 1 po bid 180 tablet 1  . mometasone (ELOCON) 0.1 % ointment APPLY TO AFFECTED AREA EVERY DAY AS NEEDED FOR RASH 45 g 0  . mometasone (NASONEX) 50 MCG/ACT nasal spray Place 2 sprays into the nose daily. 17 g 12  . mometasone (NASONEX) 50 MCG/ACT nasal spray Place 2 sprays into the nose daily. 17 g 12  . Multiple Vitamins-Minerals (AIRBORNE GUMMIES PO) Take 1 each by mouth daily.    . NONFORMULARY OR COMPOUNDED ITEM Compression socks  #1   15-20 mm/hg  Dx edema 1 each 1  . ondansetron (ZOFRAN-ODT) 4 MG disintegrating tablet USE UP TO THREE TIMES A DAY AS NEEDED FOR NAUSEA. 10 tablet 0  . Phenylephrine-DM-GG-APAP (VICKS DAYQUIL SEVERE COLD/FLU) 5-10-200-325 MG/15ML LIQD     . polyethylene glycol powder (GLYCOLAX/MIRALAX) powder TAKE 17 GRAMS BY MOUTH 2 TIMES DAILY AS NEEDED. 527 g 5  . pravastatin (PRAVACHOL) 20 MG tablet TAKE 1 TABLET BY MOUTH EVERY DAY 90 tablet 1  . PROCTOFOAM HC rectal foam APPLY FOAM THREE TIMES A DAY AS NEEDED FOR 7 DAYS 10 g 10  . sitaGLIPtin (JANUVIA) 100 MG tablet Take 1 tablet (100 mg total) by mouth daily. 30 tablet 2  . terazosin (HYTRIN) 5 MG capsule Take by mouth  daily.  3  . triamcinolone (NASACORT) 55 MCG/ACT AERO nasal inhaler Place 2 sprays into the nose daily. 1 Inhaler 12  . Triamcinolone Acetonide (NASACORT ALLERGY 24HR NA) Place into the nose.    . triamcinolone cream (KENALOG) 0.1 % Apply 1 application topically 2 (two) times daily. 30 g 0  . venlafaxine XR (EFFEXOR-XR) 75 MG 24 hr capsule TAKE 1 CAPSULE (75 MG TOTAL) BY MOUTH DAILY WITH BREAKFAST. 90 capsule 1  . doxycycline (VIBRA-TABS) 100 MG tablet Take 1 tablet (100 mg total) by mouth 2 (  two) times daily. (Patient not taking: Reported on 05/24/2019) 20 tablet 0  . promethazine-dextromethorphan (PROMETHAZINE-DM) 6.25-15 MG/5ML syrup Take 5 mLs by mouth 4 (four) times daily as needed. (Patient not taking: Reported on 05/24/2019) 118 mL 0   No current facility-administered medications on file prior to visit.      Objective:  Objective  Physical Exam Vitals signs and nursing note reviewed.  Constitutional:      General: He is sleeping.     Appearance: He is well-developed.  HENT:     Head: Normocephalic and atraumatic.  Eyes:     Pupils: Pupils are equal, round, and reactive to light.  Neck:     Musculoskeletal: Normal range of motion and neck supple.     Thyroid: No thyromegaly.  Cardiovascular:     Rate and Rhythm: Normal rate and regular rhythm.     Heart sounds: No murmur.  Pulmonary:     Effort: Pulmonary effort is normal. No respiratory distress.     Breath sounds: Normal breath sounds. No wheezing or rales.  Chest:     Chest wall: No tenderness.  Musculoskeletal:        General: No tenderness.  Skin:    General: Skin is warm and dry.     Findings: Rash present. Rash is papular.       Neurological:     Mental Status: He is oriented to person, place, and time.  Psychiatric:        Behavior: Behavior normal.        Thought Content: Thought content normal.        Judgment: Judgment normal.    BP 110/70 (BP Location: Left Arm, Patient Position: Sitting, Cuff Size:  Normal)   Pulse (!) 107   Temp 98 F (36.7 C) (Temporal)   Resp 18   Ht 5' 9.2" (1.758 m)   Wt 184 lb 12.8 oz (83.8 kg)   SpO2 95%   BMI 27.13 kg/m  Wt Readings from Last 3 Encounters:  05/24/19 184 lb 12.8 oz (83.8 kg)  05/09/19 182 lb (82.6 kg)  08/04/18 194 lb 12.8 oz (88.4 kg)     Lab Results  Component Value Date   WBC 9.5 12/10/2017   HGB 14.1 12/10/2017   HCT 41.9 12/10/2017   PLT 350.0 12/10/2017   GLUCOSE 142 (H) 12/10/2017   CHOL 158 12/10/2017   TRIG 260.0 (H) 12/10/2017   HDL 30.00 (L) 12/10/2017   LDLDIRECT 80.0 12/10/2017   LDLCALC 102 (H) 08/04/2017   ALT 17 12/10/2017   AST 15 12/10/2017   NA 143 12/10/2017   K 4.7 12/10/2017   CL 104 12/10/2017   CREATININE 1.27 12/10/2017   BUN 12 12/10/2017   CO2 31 12/10/2017   TSH 2.89 02/19/2017   PSA 1.01 10/03/2014   INR 1.11 02/10/2015   HGBA1C 7.9 (A) 08/04/2018   MICROALBUR 0.7 08/04/2017    US Venous Img Upper Uni Right  Result Date: 02/14/2015 CLINICAL DATA:  Right arm and neck pain, recent hospitalization EXAM: Right UPPER EXTREMITY VENOUS DOPPLER ULTRASOUND TECHNIQUE: Gray-scale sonography with graded compression, as well as color Doppler and duplex ultrasound were performed to evaluate the upper extremity deep venous system from the level of the subclavian vein and including the jugular, axillary, basilic, radial, ulnar and upper cephalic vein. Spectral Doppler was utilized to evaluate flow at rest and with distal augmentation maneuvers. COMPARISON:  None. FINDINGS: Contralateral Subclavian Vein: Respiratory phasicity is normal and symmetric with the symptomatic side.  No evidence of thrombus. Normal compressibility. Internal Jugular Vein: No evidence of thrombus. Normal compressibility, respiratory phasicity and response to augmentation. Subclavian Vein: No evidence of thrombus. Normal compressibility, respiratory phasicity and response to augmentation. Axillary Vein: No evidence of thrombus. Normal  compressibility, respiratory phasicity and response to augmentation. Cephalic Vein: No evidence of thrombus. Normal compressibility, respiratory phasicity and response to augmentation. Basilic Vein: No evidence of thrombus. Normal compressibility, respiratory phasicity and response to augmentation. Brachial Veins: No evidence of thrombus. Normal compressibility, respiratory phasicity and response to augmentation. Radial Veins: No evidence of thrombus. Normal compressibility, respiratory phasicity and response to augmentation. Ulnar Veins: No evidence of thrombus. Normal compressibility, respiratory phasicity and response to augmentation. Venous Reflux:  None visualized. Other Findings:  None visualized. IMPRESSION: No evidence of deep venous thrombosis. Electronically Signed   By: Conchita Paris M.D.   On: 02/14/2015 16:30     Assessment & Plan:  Plan  I have discontinued Chase [name]'s HYDROcodone-acetaminophen. I am also having him start on clotrimazole-betamethasone. Additionally, I am having him maintain his [place] PO, ondansetron, ibuprofen, diphenhydrAMINE, guaiFENesin, mometasone, Triamcinolone Acetonide (NASACORT ALLERGY 24HR NA), gentamicin cream, Multiple Vitamins-Minerals (AIRBORNE GUMMIES PO), Bayer Microlet Lancets, polyethylene glycol powder, terazosin, triamcinolone, NONFORMULARY OR COMPOUNDED ITEM, allopurinol, betamethasone dipropionate, Proctofoam HC, gabapentin, mometasone, mometasone, FreeStyle Libre 14 Day Reader, FreeStyle Libre 14 Day Sensor, Contour Next Test, pravastatin, amLODipine, triamcinolone cream, venlafaxine XR, doxycycline, metoprolol tartrate, promethazine-dextromethorphan, sitaGLIPtin, famotidine, MELATONIN PO, Tylenol PM Extra Strength, finasteride, Vicks DayQuil Severe Cold/Flu, levocetirizine, Azelastine HCl, budesonide, EPINEPHrine, Jardiance, cyclobenzaprine, and hydrOXYzine.  Meds ordered this encounter  Medications  . DISCONTD:  HYDROcodone-acetaminophen (HYCET) 7.5-325 mg/15 ml solution    Sig: TAKE 20 ML EVERY 6 HOURS AS NEEDED FOR PAIN    Dispense:  120 mL    Refill:  0    Please use [place] office address when filling rx. [facility] error says [facility] expired, but use the [place] address and it goes thru fine. Thx  . clotrimazole-betamethasone (LOTRISONE) cream    Sig: Apply 1 application topically 2 (two) times daily.    Dispense:  30 g    Refill:  0    Problem List Items Addressed This Visit      Unprioritized   Benign neoplasm of brain ([place])   Chronic low back pain    Pt had seen Dr Louanne Skye in the past  Trying to get pt in with pain management  May need to go back to ortho first       DM (diabetes mellitus) type II uncontrolled, periph vascular disorder ([place])    F/u endo      HTN (hypertension)    Well controlled, no changes to meds. Encouraged heart healthy diet such as the DASH diet and exercise as tolerated.        Hyperlipidemia LDL goal <70    Tolerating statin, encouraged heart healthy diet, avoid trans fats, minimize simple carbs and saturated fats. Increase exercise as tolerated      Other chronic pain - Primary   Palpitations   Relevant Orders   Cardiac event monitor   Persistent atrial fibrillation (HCC)    ekg-- NSr today Pt has been having palpatations Cardiac event monitor Refer to cardiology       Rash   Relevant Medications   clotrimazole-betamethasone (LOTRISONE) cream    Other Visit Diagnoses    History of atrial fibrillation       Relevant Orders   Ambulatory referral to Cardiology  EKG 12-Lead (Completed)   TSH   Cardiac event monitor   Uncontrolled type 2 diabetes mellitus with hyperglycemia (HCC)       Relevant Orders   Hemoglobin A1c   Comprehensive metabolic panel   Hyperlipidemia associated with type 2 diabetes mellitus ([place])       Relevant Orders   Lipid panel   Comprehensive metabolic panel      Follow-up: Return in about 3 months (around 08/22/2019)  for hypertension, diabetes II, hyperlipidemia, chronic pain.  Ann Held, DO

## 2019-05-24 NOTE — Telephone Encounter (Signed)
Pt states we refused medication and I don't see that. Sending to you for confirmation.   Requesting: Hycet Contract: 08/04/2017 UDS: 08/04/2017, low risk Last OV: 04/26/2019 Next OV: 05/24/2019 Last Refill: 05/02/2019, #120 ML--0 RF Database:   Please advise

## 2019-05-24 NOTE — Patient Instructions (Signed)

## 2019-05-24 NOTE — Telephone Encounter (Signed)
Please let the pt know

## 2019-05-24 NOTE — Assessment & Plan Note (Signed)
Pt had seen Dr Louanne Skye in the past  Trying to get pt in with pain management  May need to go back to ortho first

## 2019-05-24 NOTE — Telephone Encounter (Signed)
Mitzi Hansen with pt's pharmacy called in to make provider aware that they are out of the HYDROcodone-acetaminophen (HYCET) 7.5-325 mg/15 ml solution, [facility] are [facility] [address] [facility]    Please assist.

## 2019-05-24 NOTE — Assessment & Plan Note (Addendum)
Tolerating statin, encouraged heart healthy diet, avoid trans fats, minimize simple carbs and saturated fats. Increase exercise as tolerated 

## 2019-05-24 NOTE — Telephone Encounter (Signed)
I did because it was too early

## 2019-05-24 NOTE — Assessment & Plan Note (Signed)
F/u endo  

## 2019-05-24 NOTE — Assessment & Plan Note (Signed)
ekg-- NSr today Pt has been having palpatations Cardiac event monitor Refer to cardiology

## 2019-05-24 NOTE — Telephone Encounter (Signed)
Please advise 

## 2019-05-24 NOTE — Assessment & Plan Note (Signed)
Well controlled, no changes to meds. Encouraged heart healthy diet such as the DASH diet and exercise as tolerated.  °

## 2019-05-25 ENCOUNTER — Telehealth: Payer: Self-pay

## 2019-05-25 LAB — COMPREHENSIVE METABOLIC PANEL
ALT: 13 U/L (ref 0–53)
AST: 13 U/L (ref 0–37)
Albumin: 4.5 g/dL (ref 3.5–5.2)
Alkaline Phosphatase: 103 U/L (ref 39–117)
BUN: 9 mg/dL (ref 6–23)
CO2: 33 mEq/L — ABNORMAL HIGH (ref 19–32)
Calcium: 9.7 mg/dL (ref 8.4–10.5)
Chloride: 101 mEq/L (ref 96–112)
Creatinine, Ser: 0.93 mg/dL (ref 0.40–1.50)
GFR: 83.23 mL/min (ref >60.00)
Glucose, Bld: 148 mg/dL — ABNORMAL HIGH (ref 70–99)
Potassium: 4.3 mEq/L (ref 3.5–5.1)
Sodium: 142 mEq/L (ref 135–145)
Total Bilirubin: 0.5 mg/dL (ref 0.2–1.2)
Total Protein: 7.2 g/dL (ref 6.0–8.3)

## 2019-05-25 LAB — LIPID PANEL
Cholesterol: 168 mg/dL (ref 0–200)
HDL: 33.1 mg/dL — ABNORMAL LOW (ref >39.00)
NonHDL: 134.75
Total CHOL/HDL Ratio: 5
Triglycerides: 271 mg/dL — ABNORMAL HIGH (ref 0.0–149.0)
VLDL: 54.2 mg/dL — ABNORMAL HIGH (ref 0.0–40.0)

## 2019-05-25 LAB — TSH: TSH: 2.44 u[IU]/mL (ref 0.35–4.50)

## 2019-05-25 LAB — HEMOGLOBIN A1C: Hgb A1c MFr Bld: 8.3 % — ABNORMAL HIGH (ref 4.6–6.5)

## 2019-05-25 LAB — LDL CHOLESTEROL, DIRECT: Direct LDL: 87 mg/dL

## 2019-05-25 MED ORDER — JARDIANCE 25 MG PO TABS: 25.0000 mg | ORAL_TABLET | Freq: Every day | ORAL | 3 refills | Status: DC

## 2019-05-25 NOTE — Telephone Encounter (Signed)
My chart message sent to pt.

## 2019-05-25 NOTE — Telephone Encounter (Signed)
Prior authorization for Chase [name] has been approved by patient's insurance.  Coverage is effective 05/19/2019 to 05/18/2020  PA Approval/Reference Number [id number]

## 2019-05-30 ENCOUNTER — Ambulatory Visit (INDEPENDENT_AMBULATORY_CARE_PROVIDER_SITE_OTHER): Payer: BC Managed Care – PPO

## 2019-05-30 ENCOUNTER — Other Ambulatory Visit: Payer: Self-pay

## 2019-05-30 DIAGNOSIS — J309 Allergic rhinitis, unspecified: Secondary | ICD-10-CM | POA: Diagnosis not present

## 2019-05-30 NOTE — Progress Notes (Signed)
Immunotherapy   Patient Details  Name: Chase [name] MRN: [id number] Date of Birth: [birth year]  05/30/2019  [name] started his allergy injections today. Patient received 0.05 of both his blue vials. One with Pollens and the other with Molds. Patient waited in an exam room for 30 minutes with no problems. Following schedule: B Frequency: 1-2 times weekly Epi-Pen: Yes Consent signed and patient instructions given.   [name] 05/30/2019, 1:43 PM

## 2019-06-07 ENCOUNTER — Other Ambulatory Visit: Payer: Self-pay | Admitting: Family Medicine

## 2019-06-07 DIAGNOSIS — G8929 Other chronic pain: Secondary | ICD-10-CM

## 2019-06-08 ENCOUNTER — Other Ambulatory Visit: Payer: Self-pay

## 2019-06-08 ENCOUNTER — Encounter: Payer: Self-pay | Admitting: Cardiology

## 2019-06-08 ENCOUNTER — Ambulatory Visit (INDEPENDENT_AMBULATORY_CARE_PROVIDER_SITE_OTHER): Payer: BC Managed Care – PPO | Admitting: Cardiology

## 2019-06-08 VITALS — BP 128/88 | HR 88 | Ht 69.25 in | Wt 184.0 lb

## 2019-06-08 DIAGNOSIS — R002 Palpitations: Secondary | ICD-10-CM

## 2019-06-08 DIAGNOSIS — R0789 Other chest pain: Secondary | ICD-10-CM

## 2019-06-08 DIAGNOSIS — I10 Essential (primary) hypertension: Secondary | ICD-10-CM

## 2019-06-08 DIAGNOSIS — E1151 Type 2 diabetes mellitus with diabetic peripheral angiopathy without gangrene: Secondary | ICD-10-CM

## 2019-06-08 DIAGNOSIS — E1165 Type 2 diabetes mellitus with hyperglycemia: Secondary | ICD-10-CM

## 2019-06-08 DIAGNOSIS — IMO0002 Reserved for concepts with insufficient information to code with codable children: Secondary | ICD-10-CM

## 2019-06-08 HISTORY — DX: Other chest pain: R07.89

## 2019-06-08 NOTE — Progress Notes (Signed)
Cardiology Office Note:    Date:  06/08/2019   ID:  Chase [name], DOB May 24, 1961, MRN [id number]  PCP:  Ann Held, DO  Cardiologist:  Jenean Lindau, MD   Referring MD: Carollee Herter, Alferd Apa, *    ASSESSMENT:    1. Essential hypertension   2. DM (diabetes mellitus) type II uncontrolled, periph vascular disorder (HCC)   3. Chest discomfort   4. Palpitations    PLAN:    In order of problems listed above:  1. Palpitations: I discussed my findings with patient at extensive length.  His thyroid evaluation recently was unremarkable.  He will have a 2-week ZIO monitoring to evaluate this.  No history of dizziness or any syncope. 2. Essential hypertension: Blood pressure is stable and salt issues were discussed.  Echocardiogram will be done to assess murmur heard on auscultation. 3. Diabetes mellitus and dyslipidemia: Patient is on appropriate therapy.  His primary care physician and endocrinologist are doing their best to get his blood sugars under control.  Lipids are acceptable as far as the numbers are concerned. 4. Chest discomfort: Atypical however in view of multiple risk factors we will do a Lexiscan sestamibi. 5. Patient will be seen in follow-up appointment in the next 4 to 6 weeks or earlier if he has any concerns.  He knows to go to the nearest emergency room for any concerning symptoms.   Medication Adjustments/Labs and Tests Ordered: Current medicines are reviewed at length with the patient today.  Concerns regarding medicines are outlined above.  No orders of the defined types were placed in this encounter.  No orders of the defined types were placed in this encounter.    History of Present Illness:    Chase [name] is a 58 y.o. male who is being seen today for the evaluation of palpitations and chest discomfort at the request of Ann Held, *.  Patient is a pleasant 58 year old male.  He has past medical history of essential hypertension,  uncontrolled diabetes mellitus, dyslipidemia.  He gives history of palpitations and for this reason he was referred here.  He leads a sedentary lifestyle.  He tells me that he has chest discomfort on and off and not related to exertion.  At the time of my evaluation, the patient is alert awake oriented and in no distress.  He is a poor historian.  The chart mentions that he has history of stroke.  His palpitations are of concern to me and he is here for evaluation.  Past Medical History:  Diagnosis Date  . Allergy   . Arthritis   . Blood in stool   . Brain cyst   . Chest pain   . Constipation   . Diabetes mellitus without complication ([place])   . Difficulty urinating   . GERD (gastroesophageal reflux disease)   . Headache(784.0)   . Hearing loss   . Hyperlipidemia   . Hypertension   . Leg swelling   . Nasal congestion   . PONV (postoperative nausea and vomiting)   . Rectal pain   . Rectal ulcer with bleeding after hemorrhoid banding 02/11/2015  . Sleep apnea   . Stroke ([place])   . Stroke ([place])   . Thrombosed hemorrhoids   . Trouble swallowing     Past Surgical History:  Procedure Laterality Date  . CATARACT EXTRACTION     x 3  . [place]   cataracts  . FLEXIBLE SIGMOIDOSCOPY  N/A 02/11/2015   Procedure: FLEXIBLE SIGMOIDOSCOPY;  Surgeon: Gatha Mayer, MD;  Location: [facility];  Service: Endoscopy;  Laterality: N/A;  . INNER EAR SURGERY     rt ear  . INNER EAR SURGERY  1992  . surgery - right arm     1983    Current Medications: Current Meds  Medication Sig  . allopurinol (ZYLOPRIM) 100 MG tablet Take 1 tablet (100 mg total) by mouth daily.  Marland Kitchen amLODipine (NORVASC) 5 MG tablet TAKE 1 TABLET BY MOUTH EVERY DAY  . Azelastine HCl 0.15 % SOLN Two sprays each nostril 1-2 times a day as needed.  Marland Kitchen BAYER MICROLET LANCETS lancets Use 1x a day  . betamethasone dipropionate (DIPROLENE) 0.05 % ointment Apply topically 2 (two) times daily.  . budesonide  (PULMICORT) 0.5 MG/2ML nebulizer solution Mix with 240 cc saline in the [employer]Med bottle as directed and rinse sinuses twice a day.  . clotrimazole-betamethasone (LOTRISONE) cream Apply 1 application topically 2 (two) times daily.  . Continuous Blood Gluc Receiver (FREESTYLE LIBRE 14 DAY READER) DEVI 1 Device by Does not apply route every 14 (fourteen) days.  . Continuous Blood Gluc Sensor (FREESTYLE LIBRE 14 DAY SENSOR) MISC 1 each by Subdermal route every 14 (fourteen) days.  . CONTOUR NEXT TEST test strip USE ONCE DAILY - E11.51, E11.65 DISPENSE CONTOUR NEXT STRIPS  . cyclobenzaprine (FLEXERIL) 10 MG tablet TAKE 1 TABLET BY MOUTH AT BEDTIME AS NEEDED FOR MUSCLE SPASMS  . diphenhydrAMINE (BENADRYL) 25 MG tablet   . diphenhydrAMINE-APAP, sleep, (TYLENOL PM EXTRA STRENGTH) 50-1000 MG/30ML LIQD   . empagliflozin (JARDIANCE) 25 MG TABS tablet Take 25 mg by mouth daily.  Marland Kitchen EPINEPHrine 0.3 mg/0.3 mL IJ SOAJ injection Use as directed for severe allergic reaction.  . famotidine (PEPCID) 40 MG/5ML suspension TAKE 2.5 MLS (20 MG TOTAL) BY MOUTH DAILY.  Marland Kitchen FIBER SELECT GUMMIES PO Take 1 tablet by mouth daily.  . finasteride (PROSCAR) 5 MG tablet Take 5 mg by mouth daily.  Marland Kitchen gabapentin (NEURONTIN) 100 MG capsule TAKE 1 CAPSULE BY MOUTH THREE TIMES A DAY  . gentamicin cream (GARAMYCIN) 0.1 % Apply 1 application topically 3 (three) times daily.  . GuaiFENesin 200 MG/10ML SOLN   . HYDROcodone-acetaminophen (HYCET) 7.5-325 mg/15 ml solution TAKE 20 ML EVERY 6 HOURS AS NEEDED FOR PAIN  . hydrOXYzine (ATARAX/VISTARIL) 25 MG tablet TAKE 1 TABLET BY MOUTH EVERY 8 HOURS AS NEEDED FOR ANXIETY OR ITCHING  . ibuprofen (ADVIL) 200 MG tablet   . metoprolol tartrate (LOPRESSOR) 25 MG tablet 1 po bid  . mometasone (ELOCON) 0.1 % ointment APPLY TO AFFECTED AREA EVERY DAY AS NEEDED FOR RASH  . mometasone (NASONEX) 50 MCG/ACT nasal spray Place 2 sprays into the nose daily.  . Multiple Vitamins-Minerals (AIRBORNE GUMMIES PO)  Take 1 each by mouth daily.  . NONFORMULARY OR COMPOUNDED ITEM Compression socks  #1   15-20 mm/hg  Dx edema  . Phenylephrine-DM-GG-APAP (VICKS DAYQUIL SEVERE COLD/FLU) 5-10-200-325 MG/15ML LIQD   . polyethylene glycol powder (GLYCOLAX/MIRALAX) powder TAKE 17 GRAMS BY MOUTH 2 TIMES DAILY AS NEEDED.  Marland Kitchen pravastatin (PRAVACHOL) 20 MG tablet TAKE 1 TABLET BY MOUTH EVERY DAY  . PROCTOFOAM HC rectal foam APPLY FOAM THREE TIMES A DAY AS NEEDED FOR 7 DAYS  . sitaGLIPtin (JANUVIA) 100 MG tablet Take 1 tablet (100 mg total) by mouth daily.  Marland Kitchen terazosin (HYTRIN) 5 MG capsule Take by mouth daily.  Marland Kitchen triamcinolone (NASACORT) 55 MCG/ACT AERO nasal inhaler Place 2 sprays  into the nose daily.  . Triamcinolone Acetonide (NASACORT ALLERGY 24HR NA) Place into the nose.  . triamcinolone cream (KENALOG) 0.1 % Apply 1 application topically 2 (two) times daily.  Marland Kitchen venlafaxine XR (EFFEXOR-XR) 75 MG 24 hr capsule TAKE 1 CAPSULE (75 MG TOTAL) BY MOUTH DAILY WITH BREAKFAST.     Allergies:   Amoxicillin, Fluticasone, Sulfa antibiotics, Flonase [fluticasone propionate], and Terfenadine   Social History   Socioeconomic History  . Marital status: Single    Spouse name: Not on file  . Number of children: 0  . Years of education: Not on file  . Highest education level: Not on file  Occupational History  . Not on file  Tobacco Use  . Smoking status: Never Smoker  . Smokeless tobacco: Never Used  Substance and Sexual Activity  . Alcohol use: No    Alcohol/week: 0.0 standard drinks    Comment: none  . Drug use: No  . Sexual activity: Never  Other Topics Concern  . Not on file  Social History Narrative   No caffeine intake except for chocolate.  Exercised-walking   Social Determinants of Health   Financial Resource Strain:   . Difficulty of Paying Living Expenses: Not on file  Food Insecurity:   . Worried About Charity fundraiser in the Last Year: Not on file  . Ran Out of Food in the Last Year: Not on  file  Transportation Needs:   . Lack of Transportation (Medical): Not on file  . Lack of Transportation (Non-Medical): Not on file  Physical Activity:   . Days of Exercise per Week: Not on file  . Minutes of Exercise per Session: Not on file  Stress:   . Feeling of Stress : Not on file  Social Connections:   . Frequency of Communication with Friends and Family: Not on file  . Frequency of Social Gatherings with Friends and Family: Not on file  . Attends Religious Services: Not on file  . Active Member of Clubs or Organizations: Not on file  . Attends Archivist Meetings: Not on file  . Marital Status: Not on file     Family History: The patient's family history includes Breast cancer (age of onset: 59) in his mother; Heart attack in his father; Heart disease in his father; Huntington's disease in his mother; Hypertension in his brother and father; Stroke in his father. There is no history of Hyperlipidemia or Diabetes.  ROS:   Please see the history of present illness.    All other systems reviewed and are negative.  EKGs/Labs/Other Studies Reviewed:    The following studies were reviewed today: EKG done revealed sinus rhythm and nonspecific ST-T changes.   Recent Labs: 05/24/2019: ALT 13; BUN 9; Creatinine, Ser 0.93; Potassium 4.3; Sodium 142; TSH 2.44  Recent Lipid Panel    Component Value Date/Time   CHOL 168 05/24/2019 1546   TRIG 271.0 (H) 05/24/2019 1546   HDL 33.10 (L) 05/24/2019 1546   CHOLHDL 5 05/24/2019 1546   VLDL 54.2 (H) 05/24/2019 1546   LDLCALC 102 (H) 08/04/2017 1151   LDLDIRECT 87.0 05/24/2019 1546    Physical Exam:    VS:  BP 128/88 (BP Location: Right Arm, Patient Position: Sitting, Cuff Size: Normal)   Pulse 88   Ht 5' 9.25" (1.759 m)   Wt 184 lb (83.5 kg)   SpO2 98%   BMI 26.98 kg/m     Wt Readings from Last 3 Encounters:  06/08/19 184 lb (  83.5 kg)  05/24/19 184 lb 12.8 oz (83.8 kg)  05/09/19 182 lb (82.6 kg)     GEN: Patient  is in no acute distress HEENT: Normal NECK: No JVD; No carotid bruits LYMPHATICS: No lymphadenopathy CARDIAC: S1 S2 regular, 2/6 systolic murmur at the apex. RESPIRATORY:  Clear to auscultation without rales, wheezing or rhonchi  ABDOMEN: Soft, non-tender, non-distended MUSCULOSKELETAL:  No edema; No deformity  SKIN: Warm and dry NEUROLOGIC:  Alert and oriented x 3 PSYCHIATRIC:  Normal affect    Signed, Jenean Lindau, MD  06/08/2019 2:09 PM    Circle

## 2019-06-08 NOTE — Patient Instructions (Addendum)
Medication Instructions:  Your physician recommends that you continue on your current medications as directed. Please refer to the Current Medication list given to you today.  If you need a refill on your cardiac medications before your next appointment, please call your pharmacy.   Lab work: NONE If you have labs (blood work) drawn today and your tests are completely normal, you will receive your results only by: Marland Kitchen MyChart Message (if you have MyChart) OR . A paper copy in the mail If you have any lab test that is abnormal or we need to change your treatment, we will call you to review the results.  Testing/Procedures: You had an EKG performed today.  Your physician has requested that you have an echocardiogram. Echocardiography is a painless test that uses sound waves to create images of your heart. It provides your doctor with information about the size and shape of your heart and how well your heart's chambers and valves are working. This procedure takes approximately one hour. There are no restrictions for this procedure.  Your physician has requested that you have a lexiscan myoview. For further information please visit [email or web address]. Please follow instruction sheet, as given.  Your physician has recommended that you wear a ZIO monitor. ZIO monitors are medical devices that record the heart's electrical activity. Doctors most often use these monitors to diagnose arrhythmias. Arrhythmias are problems with the speed or rhythm of the heartbeat. The monitor is a small, portable device. You can wear one while you do your normal daily activities. This is usually used to diagnose what is causing palpitations/syncope (passing out).You will wear this device for 14 days     Follow-Up: At [facility], you and your health needs are our priority.  As part of our continuing mission to provide you with exceptional heart care, we have created designated Provider Care Teams.  These Care Teams  include your primary Cardiologist (physician) and Advanced Practice Providers (APPs -  Physician Assistants and Nurse Practitioners) who all work together to provide you with the care you need, when you need it. You will need a follow up appointment in 3 months.   Any Other Special Instructions Will Be Listed Below  Regadenoson injection What is this medicine? REGADENOSON is used to test the heart for coronary artery disease. It is used in patients who can not exercise for their stress test. This medicine may be used for other purposes; ask your health care provider or pharmacist if you have questions. COMMON BRAND NAME(S): Lexiscan What should I tell my health care provider before I take this medicine? They need to know if you have any of these conditions:  heart problems  lung or breathing disease, like asthma or COPD  an unusual or allergic reaction to regadenoson, other medicines, foods, dyes, or preservatives  pregnant or trying to get pregnant  breast-feeding How should I use this medicine? This medicine is for injection into a vein. It is given by a health care professional in a hospital or clinic setting. Talk to your pediatrician regarding the use of this medicine in children. Special care may be needed. Overdosage: If you think you have taken too much of this medicine contact a poison control center or emergency room at once. NOTE: This medicine is only for you. Do not share this medicine with others. What if I miss a dose? This does not apply. What may interact with this medicine?  caffeine  dipyridamole  guarana  theophylline This list may not describe all  possible interactions. Give your health care provider a list of all the medicines, herbs, non-prescription drugs, or dietary supplements you use. Also tell them if you smoke, drink alcohol, or use illegal drugs. Some items may interact with your medicine. What should I watch for while using this medicine? Your  condition will be monitored carefully while you are receiving this medicine. Do not take medicines, foods, or drinks with caffeine (like coffee, tea, or colas) for at least 12 hours before your test. If you do not know if something contains caffeine, ask your health care professional. What side effects may I notice from receiving this medicine? Side effects that you should report to your doctor or health care professional as soon as possible:  allergic reactions like skin rash, itching or hives, swelling of the face, lips, or tongue  breathing problems  chest pain, tightness or palpitations  severe headache Side effects that usually do not require medical attention (report to your doctor or health care professional if they continue or are bothersome):  flushing  headache  irritation or pain at site where injected  nausea, vomiting This list may not describe all possible side effects. Call your doctor for medical advice about side effects. You may report side effects to FDA at 1-800-FDA-1088. Where should I keep my medicine? This drug is given in a hospital or clinic and will not be stored at home. NOTE: This sheet is a summary. It may not cover all possible information. If you have questions about this medicine, talk to your doctor, pharmacist, or health care provider.  2020 Elsevier/Gold Standard (2008-01-31 15:08:13)  Cardiac Nuclear Scan A cardiac nuclear scan is a test that is done to check the flow of blood to your heart. It is done when you are resting and when you are exercising. The test looks for problems such as:  Not enough blood reaching a portion of the heart.  The heart muscle not working as it should. You may need this test if:  You have heart disease.  You have had lab results that are not normal.  You have had heart surgery or a balloon procedure to open up blocked arteries (angioplasty).  You have chest pain.  You have shortness of breath. In this test,  a special dye (tracer) is put into your bloodstream. The tracer will travel to your heart. A camera will then take pictures of your heart to see how the tracer moves through your heart. This test is usually done at a hospital and takes 2-4 hours. Tell a doctor about:  Any allergies you have.  All medicines you are taking, including vitamins, herbs, eye drops, creams, and over-the-counter medicines.  Any problems you or family members have had with anesthetic medicines.  Any blood disorders you have.  Any surgeries you have had.  Any medical conditions you have.  Whether you are pregnant or may be pregnant. What are the risks? Generally, this is a safe test. However, problems may occur, such as:  Serious chest pain and heart attack. This is only a risk if the stress portion of the test is done.  Rapid heartbeat.  A feeling of warmth in your chest. This feeling usually does not last long.  Allergic reaction to the tracer. What happens before the test?  Ask your doctor about changing or stopping your normal medicines. This is important.  Follow instructions from your doctor about what you cannot eat or drink.  Remove your jewelry on the day of the test.  What happens during the test?  An IV tube will be inserted into one of your veins.  Your doctor will give you a small amount of tracer through the IV tube.  You will wait for 20-40 minutes while the tracer moves through your bloodstream.  Your heart will be monitored with an electrocardiogram (ECG).  You will lie down on an exam table.  Pictures of your heart will be taken for about 15-20 minutes.  You may also have a stress test. For this test, one of these things may be done: ? You will be asked to exercise on a treadmill or a stationary bike. ? You will be given medicines that will make your heart work harder. This is done if you are unable to exercise.  When blood flow to your heart has peaked, a tracer will again  be given through the IV tube.  After 20-40 minutes, you will get back on the exam table. More pictures will be taken of your heart.  Depending on the tracer that is used, more pictures may need to be taken 3-4 hours later.  Your IV tube will be removed when the test is over. The test may vary among doctors and hospitals. What happens after the test?  Ask your doctor: ? Whether you can return to your normal schedule, including diet, activities, and medicines. ? Whether you should drink more fluids. This will help to remove the tracer from your body. Drink enough fluid to keep your pee (urine) pale yellow.  Ask your doctor, or the department that is doing the test: ? When will my results be ready? ? How will I get my results? Summary  A cardiac nuclear scan is a test that is done to check the flow of blood to your heart.  Tell your doctor whether you are pregnant or may be pregnant.  Before the test, ask your doctor about changing or stopping your normal medicines. This is important.  Ask your doctor whether you can return to your normal activities. You may be asked to drink more fluids. This information is not intended to replace advice given to you by your health care provider. Make sure you discuss any questions you have with your health care provider. Document Released: 11/16/2017 Document Revised: 09/22/2018 Document Reviewed: 11/16/2017 Elsevier Patient Education  [place].  Echocardiogram An echocardiogram is a procedure that uses painless sound waves (ultrasound) to produce an image of the heart. Images from an echocardiogram can provide important information about:  Signs of coronary artery disease (CAD).  Aneurysm detection. An aneurysm is a weak or damaged part of an artery wall that bulges out from the normal force of blood pumping through the body.  Heart size and shape. Changes in the size or shape of the heart can be associated with certain conditions,  including heart failure, aneurysm, and CAD.  Heart muscle function.  Heart valve function.  Signs of a past heart attack.  Fluid buildup around the heart.  Thickening of the heart muscle.  A tumor or infectious growth around the heart valves. Tell a health care provider about:  Any allergies you have.  All medicines you are taking, including vitamins, herbs, eye drops, creams, and over-the-counter medicines.  Any blood disorders you have.  Any surgeries you have had.  Any medical conditions you have.  Whether you are pregnant or may be pregnant. What are the risks? Generally, this is a safe procedure. However, problems may occur, including:  Allergic reaction to  dye (contrast) that may be used during the procedure. What happens before the procedure? No specific preparation is needed. You may eat and drink normally. What happens during the procedure?   An IV tube may be inserted into one of your veins.  You may receive contrast through this tube. A contrast is an injection that improves the quality of the pictures from your heart.  A gel will be applied to your chest.  A wand-like tool (transducer) will be moved over your chest. The gel will help to transmit the sound waves from the transducer.  The sound waves will harmlessly bounce off of your heart to allow the heart images to be captured in real-time motion. The images will be recorded on a computer. The procedure may vary among health care providers and hospitals. What happens after the procedure?  You may return to your normal, everyday life, including diet, activities, and medicines, unless your health care provider tells you not to do that. Summary  An echocardiogram is a procedure that uses painless sound waves (ultrasound) to produce an image of the heart.  Images from an echocardiogram can provide important information about the size and shape of your heart, heart muscle function, heart valve function,  and fluid buildup around your heart.  You do not need to do anything to prepare before this procedure. You may eat and drink normally.  After the echocardiogram is completed, you may return to your normal, everyday life, unless your health care provider tells you not to do that. This information is not intended to replace advice given to you by your health care provider. Make sure you discuss any questions you have with your health care provider. Document Released: 05/30/2000 Document Revised: 09/23/2018 Document Reviewed: 07/05/2016 Elsevier Patient Education  2020 [employer].

## 2019-06-08 NOTE — Telephone Encounter (Signed)
Requesting:Hydrocodone Contract: none needs csc UDS:08/04/2017 Last Visit:05/24/2019 Next Visit:08/22/2019 Last Refill:05/24/2019  Please Advise

## 2019-06-14 ENCOUNTER — Encounter: Payer: Self-pay | Admitting: Family Medicine

## 2019-06-14 ENCOUNTER — Ambulatory Visit (HOSPITAL_BASED_OUTPATIENT_CLINIC_OR_DEPARTMENT_OTHER)
Admission: RE | Admit: 2019-06-14 | Discharge: 2019-06-14 | Disposition: A | Discharge status: Home / Self Care | Payer: BC Managed Care – PPO | Source: Ambulatory Visit | Attending: Cardiology | Admitting: Cardiology

## 2019-06-14 ENCOUNTER — Other Ambulatory Visit: Payer: Self-pay

## 2019-06-14 DIAGNOSIS — R002 Palpitations: Secondary | ICD-10-CM | POA: Insufficient documentation

## 2019-06-14 DIAGNOSIS — R0789 Other chest pain: Secondary | ICD-10-CM

## 2019-06-14 DIAGNOSIS — I10 Essential (primary) hypertension: Secondary | ICD-10-CM | POA: Diagnosis not present

## 2019-06-14 NOTE — Telephone Encounter (Signed)
He had asked for it too soon I think it can't be refilled until first week in Jan She was supposed to f/u with his ortho to see if anthing else can be done for him otherwise he needs a pain management referral

## 2019-06-14 NOTE — Progress Notes (Signed)
  Echocardiogram 2D Echocardiogram has been performed.  Chase [name] 06/14/2019, 2:13 PM

## 2019-06-15 ENCOUNTER — Other Ambulatory Visit: Payer: Self-pay | Admitting: Family Medicine

## 2019-06-15 ENCOUNTER — Telehealth (HOSPITAL_COMMUNITY): Payer: Self-pay

## 2019-06-15 DIAGNOSIS — E785 Hyperlipidemia, unspecified: Secondary | ICD-10-CM

## 2019-06-15 NOTE — Telephone Encounter (Signed)
Spoke with the patient, instructions given. He stated that he would be here for his test. Asked to call back with any questions. S.Karrah Mangini EMTP 

## 2019-06-16 ENCOUNTER — Other Ambulatory Visit: Payer: Self-pay

## 2019-06-16 ENCOUNTER — Ambulatory Visit (HOSPITAL_COMMUNITY): Payer: BC Managed Care – PPO | Attending: Cardiology

## 2019-06-16 VITALS — Ht 69.25 in | Wt 184.0 lb

## 2019-06-16 DIAGNOSIS — I10 Essential (primary) hypertension: Secondary | ICD-10-CM | POA: Diagnosis not present

## 2019-06-16 DIAGNOSIS — R002 Palpitations: Secondary | ICD-10-CM | POA: Insufficient documentation

## 2019-06-16 DIAGNOSIS — R0789 Other chest pain: Secondary | ICD-10-CM | POA: Insufficient documentation

## 2019-06-16 LAB — MYOCARDIAL PERFUSION IMAGING
LV dias vol: 93 mL (ref 62–150)
LV sys vol: 37 mL
Peak HR: 106 {beats}/min
Rest HR: 80 {beats}/min
SDS: 0
SRS: 0
SSS: 0
TID: 1.01

## 2019-06-16 MED ORDER — REGADENOSON 0.4 MG/5ML IV SOLN
0.4000 mg | Freq: Once | INTRAVENOUS | Status: AC
  Administered 2019-06-16: 0.4 mg via INTRAVENOUS

## 2019-06-16 MED ORDER — TECHNETIUM TC 99M TETROFOSMIN IV KIT
9.5000 | PACK | Freq: Once | INTRAVENOUS | Status: AC | PRN
  Administered 2019-06-16: 9.5 via INTRAVENOUS
  Filled 2019-06-16: qty 10

## 2019-06-16 MED ORDER — TECHNETIUM TC 99M TETROFOSMIN IV KIT
31.5000 | PACK | Freq: Once | INTRAVENOUS | Status: AC | PRN
  Administered 2019-06-16: 31.5 via INTRAVENOUS
  Filled 2019-06-16: qty 32

## 2019-06-21 ENCOUNTER — Encounter: Payer: Self-pay | Admitting: *Deleted

## 2019-06-22 ENCOUNTER — Other Ambulatory Visit: Payer: Self-pay | Admitting: Podiatry

## 2019-06-30 ENCOUNTER — Encounter: Payer: Self-pay | Admitting: Family Medicine

## 2019-07-01 NOTE — Telephone Encounter (Signed)
It may help if pt f/u with [facility] first to see if anything else can be done

## 2019-07-18 ENCOUNTER — Other Ambulatory Visit: Payer: Self-pay | Admitting: Family Medicine

## 2019-07-18 DIAGNOSIS — K219 Gastro-esophageal reflux disease without esophagitis: Secondary | ICD-10-CM

## 2019-07-19 ENCOUNTER — Ambulatory Visit: Payer: Medicaid Other | Admitting: Allergy & Immunology

## 2019-07-26 ENCOUNTER — Other Ambulatory Visit: Payer: Self-pay | Admitting: Family Medicine

## 2019-07-26 DIAGNOSIS — M25511 Pain in right shoulder: Secondary | ICD-10-CM

## 2019-07-27 NOTE — Telephone Encounter (Signed)
RF request for Flexeril LOV:05/24/19 Next ov: 08/22/19 Last written:05/23/19(30,1)  Please Advise. Medication pending

## 2019-08-08 ENCOUNTER — Encounter: Payer: Self-pay | Admitting: Neurology

## 2019-08-08 ENCOUNTER — Ambulatory Visit (INDEPENDENT_AMBULATORY_CARE_PROVIDER_SITE_OTHER): Payer: BC Managed Care – PPO | Admitting: Neurology

## 2019-08-08 ENCOUNTER — Other Ambulatory Visit: Payer: Self-pay

## 2019-08-08 VITALS — BP 127/75 | HR 81 | Temp 98.9°F | Ht 69.5 in | Wt 184.0 lb

## 2019-08-08 DIAGNOSIS — F332 Major depressive disorder, recurrent severe without psychotic features: Secondary | ICD-10-CM | POA: Diagnosis not present

## 2019-08-08 DIAGNOSIS — F5104 Psychophysiologic insomnia: Secondary | ICD-10-CM

## 2019-08-08 DIAGNOSIS — K922 Gastrointestinal hemorrhage, unspecified: Secondary | ICD-10-CM | POA: Diagnosis not present

## 2019-08-08 DIAGNOSIS — G4730 Sleep apnea, unspecified: Secondary | ICD-10-CM

## 2019-08-08 DIAGNOSIS — R351 Nocturia: Secondary | ICD-10-CM | POA: Diagnosis not present

## 2019-08-08 MED ORDER — CYPROHEPTADINE HCL 4 MG PO TABS: 4.0000 mg | ORAL_TABLET | Freq: Every day | ORAL | 0 refills | Status: DC

## 2019-08-08 NOTE — Patient Instructions (Signed)

## 2019-08-08 NOTE — Progress Notes (Signed)
SLEEP MEDICINE CLINIC   Provider:  Larey Seat, MD   Primary Care Physician:  Carollee Herter, Alferd Apa, [name]   Referring Provider: Carollee Herter, Alferd Apa, *    Chief Complaint  Patient presents with  . New Patient (Initial Visit)    pt alone, rm 11. pt states that he is having increase in daytime sleepiness, memory difficulties and headaches. current CPAP user, this past June have been 5 years with current machine. Last sleep study completed here on 11/17/2012.     HPI:  Chase [name] is a 59 y.o. male , seen here as in a re-referral from Dr. Carollee Herter for further evaluation of CPAP needs, in the setting of cognitive challenges.   I had the pleasure of meeting again with [name] today he is meanwhile 59 years old today is 08 August 2019.  The patient has been struggling with sleep concerns he often has a congested nasal passage and it has caused difficulties to use his CPAP machine.  He is trying to use it most nights but sometimes does not make the 4-hour mark.  He had was then embolized but not getting supplies as he was deemed noncompliant.  His describes his nose "closes up" but there is no runny nose unless he eats.  He was given an allergy medication but he has not found it working. He was given a nasal rinse, he reports. Sinus flow. He can't stand it. He has frequent sinus headaches, but the CPAP had helped those.  He has atrial fibrillation and was seen for a stroke in 2016. I offered today to use periactin.    05-10-2018 , I have the pleasure of meeting today with [name] on 10 May 2018.  [name], 59 years old,  had undergone a baseline polysomnography on 01 March 2018.  He reported trouble sleeping through the night excessive daytime sleepiness and pain at the jaw and atypical facial pain. He often wakes up with headaches but he also has a long history of snoring and excessive daytime sleepiness.  His repeat sleep study showed an AHI of only  5.8/h, there was no REM sleep recorded, and all sleep in nonsupine position.  A total sleep time of only 145 minutes was recorded sleep efficiency was extremely poor at only 38.3% of the recorded night.  [name] struggled with airway congestion sniffling and coughing all of which delayed his sleep onset and reduce the recorded sleep time.  I wrote that the apnea would be to mild and apparently is not excessively associated hypoxemia to require Pap treatment.  I requested neuropsychological evaluation, Dr. Etter Sjogren had seen the patient after the sleep test found that he had suffered from acute sinusitis and had begun treating him for this.  Soon after the treatment was initiated he felt better. He continues to use his CPAP at night as this helps him stay awake in daytime.  He had multiple mini-strokes in the past, and feels this therapy will protect him. He needs a new machine - his is over 16 years old.  He has a lot of sinus disease. ENT has not seen  him - I like for him to see Dr. Wilburn Cornelia.   Auto titration machine will be ordered and he follow up with NP. I will order 5-15 cm water pressure, his preferred mask is a nasal mask, by [employer]Med in medium . He also had a dream wear nasal cradle mask, [facility].  Chief complaint according to patient : [name] states that he is having increase in daytime sleepiness, memory difficulties and headaches. He is a current CPAP user, this past June has been 5 years with current CPAP machine. DM is worse, aphasia increased, headaches are bad and back. Last sleep study completed here on 11/17/2012.  I have the pleasure of meeting today with [name], and meanwhile 59 year old Caucasian right-handed gentleman primary care patient of Dr. Garnet [name].  Over 5 years ago the patient was referred by Dr. Drusilla Kanner, at the time at [facility].  He was 59 years old, had suffered several strokes, suffered from headaches, GERD, osteoarthritis, respiratory allergies,  uncontrolled diabetes and hypertension hyperlipidemia, upper airway resistance the syndrome and mild obstructive sleep apnea had been already diagnosed in 2008, at the time by Dr. Danton [name].  He reported trouble sleeping through the night, excessive daytime sleepiness and pain at the jaw which made it difficult to sleep tolerate certain headgear's.  He was known to snore and he woke up this morning headaches.  His Epworth Sleepiness Scale had been endorsed at 17 points his AHI was 74.9, REM sleep had not been observed.  The study was split and CPAP titrated to 7 cmH2O REM sleep was still not seen, only 10 central apneas remained all obstructive apneas have been covered.  7 cm water pressure seemed to be partially effective.  The patient also brought to his compliance record here today, in the last 30 days he has used the machine 27 out of 30 days and 18 of those days over 4 hours, average use of time is just below 4 hours at 3 hours 50 minutes, CPAP is still set at 7 cm water pressure with 3 cm EPR, residual AHI is 4.7.  Central apneas occupied 3.8/h obstructive apneas 0.4/h there is actually no major air leak noted.  The current interface seems to work well for him.  He had a sudden rise in apnea since 30 January 2018- he is not sure if this would correlate with a new mask he just received ( August 9 th ).   HPI "  Sleep habits are as follows: Dinner time is between 5- 9 PM, a wide range. Watches Tv after dinner, multiple hours. Goes to the bedroom between 11 PMand 8 AM ( yes, 9 hours) and is most nights asleep promptly- he sleeps on his back and sides, one pillow. Bedroom is cool and quiet , not all dark.  He uses electronics in the bedroom, he has his smart phone and grabs it when he goes to the bathroom. Nocturia 1-3 times.  He sleeps a total of about 7 hours. When he wakes up he reports headaches, sinus headaches, of a quality that he described as dull, throbbing and generalized- forehead, top of the  head, an he feels sinus pressure, too. He wakes up spontanously - cannot recall he last time he felt refreshed, restored.  Sleep medical history and family sleep history:  No family history of OSA, brother has HTN, thyroid disease.  Mother had Huntington's disease- he and his brother have not had signs.  She died of breast cancer.  Social history:   Non smoker, but a lot of second hand exposure, ETOH none, caffeine - rare.  Disabled for over 5 years- since CVA. He lives with his best friend " [name] ".     Review of Systems: Out of a complete 14 system review, the patient complains of  only the following symptoms, and all other reviewed systems are negative.  Snoring, EDS, major depression, nocturia, poor sleep habits, cataract vision impairment, multiple strokes ( Dr. Erling Cruz Dr. Leonie Man) . Facial asymmetry.   Epworth Sleepiness score: 14/ 24  ,  Fatigue severity score 49/ 63 , depression score  During this pandemic increased to 12/ 15 major depression- on effexor.    Social History   Socioeconomic History  . Marital status: Single    Spouse name: Not on file  . Number of children: 0  . Years of education: Not on file  . Highest education level: Not on file  Occupational History  . Not on file  Tobacco Use  . Smoking status: Never Smoker  . Smokeless tobacco: Never Used  Substance and Sexual Activity  . Alcohol use: No    Alcohol/week: 0.0 standard drinks    Comment: none  . Drug use: No  . Sexual activity: Never  Other Topics Concern  . Not on file  Social History Narrative   No caffeine intake except for chocolate.  Exercised-walking   Social Determinants of Health   Financial Resource Strain:   . Difficulty of Paying Living Expenses: Not on file  Food Insecurity:   . Worried About Charity fundraiser in the Last Year: Not on file  . Ran Out of Food in the Last Year: Not on file  Transportation Needs:   . Lack of Transportation (Medical): Not on file  . Lack of  Transportation (Non-Medical): Not on file  Physical Activity:   . Days of Exercise per Week: Not on file  . Minutes of Exercise per Session: Not on file  Stress:   . Feeling of Stress : Not on file  Social Connections:   . Frequency of Communication with Friends and Family: Not on file  . Frequency of Social Gatherings with Friends and Family: Not on file  . Attends Religious Services: Not on file  . Active Member of Clubs or Organizations: Not on file  . Attends Archivist Meetings: Not on file  . Marital Status: Not on file  Intimate Partner Violence:   . Fear of Current or Ex-Partner: Not on file  . Emotionally Abused: Not on file  . Physically Abused: Not on file  . Sexually Abused: Not on file    Family History  Problem Relation Age of Onset  . Breast cancer Mother 59       breast  . Huntington's disease Mother   . Stroke Father   . Heart disease Father   . Hypertension Father   . Heart attack Father   . Hypertension Brother   . Hyperlipidemia Neg Hx   . Diabetes Neg Hx     Past Medical History:  Diagnosis Date  . Allergy   . Arthritis   . Blood in stool   . Brain cyst   . Chest pain   . Constipation   . Diabetes mellitus without complication ([place])   . Difficulty urinating   . GERD (gastroesophageal reflux disease)   . Headache(784.0)   . Hearing loss   . Hyperlipidemia   . Hypertension   . Leg swelling   . Nasal congestion   . PONV (postoperative nausea and vomiting)   . Rectal pain   . Rectal ulcer with bleeding after hemorrhoid banding 02/11/2015  . Sleep apnea   . Stroke ([place])   . Stroke ([place])   . Thrombosed hemorrhoids   . Trouble swallowing  Past Surgical History:  Procedure Laterality Date  . CATARACT EXTRACTION     x 3  . [place]   cataracts  . FLEXIBLE SIGMOIDOSCOPY N/A 02/11/2015   Procedure: FLEXIBLE SIGMOIDOSCOPY;  Surgeon: Gatha Mayer, MD;  Location: [facility];  Service: Endoscopy;   Laterality: N/A;  . INNER EAR SURGERY     rt ear  . INNER EAR SURGERY  1992  . surgery - right arm     1983    Current Outpatient Medications  Medication Sig Dispense Refill  . allopurinol (ZYLOPRIM) 100 MG tablet Take 1 tablet (100 mg total) by mouth daily. 90 tablet 1  . amLODipine (NORVASC) 5 MG tablet TAKE 1 TABLET BY MOUTH EVERY DAY 90 tablet 1  . Azelastine HCl 0.15 % SOLN Two sprays each nostril 1-2 times a day as needed. 30 mL 5  . BAYER MICROLET LANCETS lancets Use 1x a day 100 each 4  . betamethasone dipropionate (DIPROLENE) 0.05 % ointment Apply topically 2 (two) times daily. 30 g 0  . clotrimazole-betamethasone (LOTRISONE) cream Apply 1 application topically 2 (two) times daily. 30 g 0  . Continuous Blood Gluc Receiver (FREESTYLE LIBRE 14 DAY READER) DEVI 1 Device by Does not apply route every 14 (fourteen) days. 1 Device 0  . Continuous Blood Gluc Sensor (FREESTYLE LIBRE 14 DAY SENSOR) MISC 1 each by Subdermal route every 14 (fourteen) days. 6 each 2  . CONTOUR NEXT TEST test strip USE ONCE DAILY - E11.51, E11.65 DISPENSE CONTOUR NEXT STRIPS 100 strip 3  . cyclobenzaprine (FLEXERIL) 10 MG tablet TAKE 1 TABLET BY MOUTH AT BEDTIME AS NEEDED FOR MUSCLE SPASMS 30 tablet 1  . diphenhydrAMINE (BENADRYL) 25 MG tablet     . empagliflozin (JARDIANCE) 25 MG TABS tablet Take 25 mg by mouth daily. 90 tablet 3  . EPINEPHrine 0.3 mg/0.3 mL IJ SOAJ injection Use as directed for severe allergic reaction. 2 each 3  . famotidine (PEPCID) 40 MG/5ML suspension TAKE 2.5 MLS (20 MG TOTAL) BY MOUTH DAILY. 100 mL 2  . FIBER SELECT GUMMIES PO Take 1 tablet by mouth daily.    . finasteride (PROSCAR) 5 MG tablet Take 5 mg by mouth daily.    Marland Kitchen gabapentin (NEURONTIN) 100 MG capsule TAKE 1 CAPSULE BY MOUTH THREE TIMES A DAY 90 capsule 3  . gentamicin cream (GARAMYCIN) 0.1 % Apply 1 application topically 3 (three) times daily. 30 g 1  . GuaiFENesin 200 MG/10ML SOLN     . hydrOXYzine (ATARAX/VISTARIL) 25  MG tablet TAKE 1 TABLET BY MOUTH EVERY 8 HOURS AS NEEDED FOR ANXIETY OR ITCHING 270 tablet 1  . Ibuprofen-Acetaminophen (ADVIL DUAL ACTION) 125-250 MG TABS Take 2 tablets by mouth daily.    Marland Kitchen levocetirizine (XYZAL) 5 MG tablet Take 1 tablet (5 mg total) by mouth daily. 30 tablet 5  . metoprolol tartrate (LOPRESSOR) 25 MG tablet 1 po bid 180 tablet 1  . mometasone (ELOCON) 0.1 % ointment APPLY TO AFFECTED AREA EVERY DAY AS NEEDED FOR RASH 45 g 0  . Multiple Vitamins-Minerals (AIRBORNE GUMMIES PO) Take 1 each by mouth daily.    . NONFORMULARY OR COMPOUNDED ITEM Compression socks  #1   15-20 mm/hg  Dx edema 1 each 1  . ondansetron (ZOFRAN-ODT) 4 MG disintegrating tablet USE UP TO THREE TIMES A DAY AS NEEDED FOR NAUSEA. 10 tablet 0  . polyethylene glycol powder (GLYCOLAX/MIRALAX) powder TAKE 17 GRAMS BY MOUTH 2 TIMES DAILY AS NEEDED. (Patient  taking differently: daily. ) 527 g 5  . pravastatin (PRAVACHOL) 20 MG tablet TAKE 1 TABLET BY MOUTH EVERY DAY 90 tablet 0  . PROCTOFOAM HC rectal foam APPLY FOAM THREE TIMES A DAY AS NEEDED FOR 7 DAYS 10 g 10  . sitaGLIPtin (JANUVIA) 100 MG tablet Take 1 tablet (100 mg total) by mouth daily. 30 tablet 2  . terazosin (HYTRIN) 5 MG capsule Take by mouth daily.  3  . triamcinolone cream (KENALOG) 0.1 % Apply 1 application topically 2 (two) times daily. 30 g 0  . venlafaxine XR (EFFEXOR-XR) 75 MG 24 hr capsule TAKE 1 CAPSULE (75 MG TOTAL) BY MOUTH DAILY WITH BREAKFAST. 90 capsule 1  . budesonide (PULMICORT) 0.5 MG/2ML nebulizer solution Mix with 240 cc saline in the [employer] bottle as directed and rinse sinuses twice a day. (Patient not taking: Reported on 08/08/2019) 120 mL 5   No current facility-administered medications for this visit.    Allergies as of 08/08/2019 - Review Complete 08/08/2019  Allergen Reaction Noted  . Amoxicillin Hives, Itching, and Other (See Comments) 12/23/2006  . Fluticasone  05/02/2015  . Sulfa antibiotics Hives and Rash 01/30/2016  .  Flonase [fluticasone propionate]  08/25/2014  . Terfenadine  12/09/2007    Vitals: BP 127/75   Pulse 81   Temp 98.9 F (37.2 C)   Ht 5' 9.5" (1.765 m)   Wt 184 lb (83.5 kg)   BMI 26.78 kg/m  Last Weight:  Wt Readings from Last 1 Encounters:  08/08/19 184 lb (83.5 kg)   [id number] mass index is 26.78 kg/m.     Last Height:   Ht Readings from Last 1 Encounters:  08/08/19 5' 9.5" (1.765 m)    Physical exam:  General: The patient is awake, alert and appears distressed. The patient is well groomed. He doesn't make eye contact..  Head: Normocephalic, atraumatic. Neck is supple. Mallampati 5- lost many teeth, poor dental status.  neck circumference:15.5 ". Nasal airflow patent, TMJ click and pain more on the left than right. Retrognathia is not seen.  Cardiovascular:  Regular rate and rhythm , without  murmurs or carotid bruit, and without distended neck veins. Respiratory: Lungs are clear to auscultation. Skin:   Ruddy skin, facial acne scars.  Without evidence of edema, or rash Trunk: BMI is 28. The patient's posture is stooped, he is frequently moving.    Neurologic exam : The patient is awake and alert, oriented to place and time.    Attention span & concentration ability appears limited. Speech; hoarseness with aphasia. Haltering speech, very low vlume.   Mood and affect are severely depressed.   Cranial nerves: Pupils are equal and briskly reactive to light. Left eye deviates, esotropia,  Right eye vsion loss,Hearing loss- hearing aids.   Facial sensation intact to fine touch. Reports  Allodynia at times - trigeminal neuralgia?  Facial motor strength is asymmetric, but tongue in midline.  No fasciculations. Shoulder shrug was symmetrical.   Motor exam: diffuse elevated tone, symmetric muscle bulk in all extremities. He has restriction in ROM of several joints, shoulder rotation, pronation and supination.  Sensory:  Fine touch, pinprick and vibration were  normal.  Finger-to-nose maneuver  normal without evidence of ataxia, dysmetria or tremor. Gait and station: Patient walks without assistive device . Deep tendon reflexes: in the upper and lower extremities are symmetric and intact.   Assessment:  After physical and neurologic examination, review of laboratory studies,  Personal review of imaging studies, reports of other /  same  Imaging studies, results of polysomnography and / or neurophysiology testing and pre-existing records as far as provided in visit., my assessment is:    1)  His original main complaint  Was excessive daytime sleepiness - AHI was 5.8/h  By PSG- enough to continue CPAP. This time with autotitration device.  He has been not fully compliant partially due to vasomotor rhinitis.  No hypoxemia, and given his sinus headaches history that is cause of the morning headaches  Headaches improved on CPAP>  He needs new supplies.  He shall start on periactin.  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  [ pseudor demntia may be the explanaition g for his subjective cognitive changes.    The patient was advised of the nature of the diagnosed disorder ( mild OSA )  , the treatment options and the  risks for general health and wellness arising from not treating the condition.   I spent more than 25 minutes of face to face time with the patient.  I will order new supplies for Mr Mcfarren, who has failed to document the needed compliance. He remains on an autotitration CPAP 5-15 cm water, with 2 cm EPR , heated humidity and nasal mask of patient's choice.  [facility].  He has a ResMed F 30 I FFM in small with small headgear. .  Mr. Batt needs new supplies for his CPAP.  By dates his user time has been 28 out of 30 days or 93% compliance, however, by hours he has only fulfilled 43% compliance.   He needs urgently a new interface as he has developed large  air leaks.  His AHI is still well controlled at 2.3/h.  I will reorder a HST if [facility] can not refill supplies, to allow a new prescription.   Larey Seat, MD [id number], [id number] PM  Certified in Neurology by ABPN Certified in Sleep Medicine by [facility] [address], [place], [place] [address]    CC Dr Antony Contras, MD Cc Dr Roma Schanz.

## 2019-08-17 DIAGNOSIS — G4733 Obstructive sleep apnea (adult) (pediatric): Secondary | ICD-10-CM | POA: Diagnosis not present

## 2019-08-19 ENCOUNTER — Other Ambulatory Visit: Payer: Self-pay

## 2019-08-22 ENCOUNTER — Ambulatory Visit (INDEPENDENT_AMBULATORY_CARE_PROVIDER_SITE_OTHER): Payer: BC Managed Care – PPO | Admitting: Family Medicine

## 2019-08-22 ENCOUNTER — Encounter: Payer: Self-pay | Admitting: Family Medicine

## 2019-08-22 ENCOUNTER — Other Ambulatory Visit: Payer: Self-pay

## 2019-08-22 VITALS — BP 100/58 | HR 89 | Temp 97.9°F | Resp 18 | Ht 69.5 in | Wt 187.6 lb

## 2019-08-22 DIAGNOSIS — Z23 Encounter for immunization: Secondary | ICD-10-CM

## 2019-08-22 DIAGNOSIS — I639 Cerebral infarction, unspecified: Secondary | ICD-10-CM

## 2019-08-22 DIAGNOSIS — E1151 Type 2 diabetes mellitus with diabetic peripheral angiopathy without gangrene: Secondary | ICD-10-CM | POA: Diagnosis not present

## 2019-08-22 DIAGNOSIS — M1A09X0 Idiopathic chronic gout, multiple sites, without tophus (tophi): Secondary | ICD-10-CM | POA: Diagnosis not present

## 2019-08-22 DIAGNOSIS — M25511 Pain in right shoulder: Secondary | ICD-10-CM

## 2019-08-22 DIAGNOSIS — Z79899 Other long term (current) drug therapy: Secondary | ICD-10-CM

## 2019-08-22 DIAGNOSIS — I10 Essential (primary) hypertension: Secondary | ICD-10-CM

## 2019-08-22 DIAGNOSIS — G8929 Other chronic pain: Secondary | ICD-10-CM

## 2019-08-22 DIAGNOSIS — E1165 Type 2 diabetes mellitus with hyperglycemia: Secondary | ICD-10-CM | POA: Diagnosis not present

## 2019-08-22 DIAGNOSIS — E785 Hyperlipidemia, unspecified: Secondary | ICD-10-CM | POA: Diagnosis not present

## 2019-08-22 DIAGNOSIS — M5440 Lumbago with sciatica, unspecified side: Secondary | ICD-10-CM

## 2019-08-22 DIAGNOSIS — K649 Unspecified hemorrhoids: Secondary | ICD-10-CM | POA: Diagnosis not present

## 2019-08-22 DIAGNOSIS — T148XXA Other injury of unspecified body region, initial encounter: Secondary | ICD-10-CM

## 2019-08-22 DIAGNOSIS — IMO0002 Reserved for concepts with insufficient information to code with codable children: Secondary | ICD-10-CM

## 2019-08-22 MED ORDER — HYDROCODONE-ACETAMINOPHEN 7.5-325 MG/15ML PO SOLN: ORAL | 0 refills | Status: DC

## 2019-08-22 MED ORDER — ALLOPURINOL 100 MG PO TABS: 100.0000 mg | ORAL_TABLET | Freq: Every day | ORAL | 1 refills | Status: DC

## 2019-08-22 MED ORDER — POLYETHYLENE GLYCOL 3350 17 GM/SCOOP PO POWD: ORAL | 5 refills | Status: DC

## 2019-08-22 MED FILL — HYDROCOD-APAP 7.5-325/15ML: 7.5-325 | 8 days supply | Qty: 473 | Fill #0

## 2019-08-22 NOTE — Patient Instructions (Signed)
DASH Eating Plan DASH stands for "Dietary Approaches to Stop Hypertension." The DASH eating plan is a healthy eating plan that has been shown to reduce high blood pressure (hypertension). It may also reduce your risk for type 2 diabetes, heart disease, and stroke. The DASH eating plan may also help with weight loss. What are tips for following this plan?  General guidelines  Avoid eating more than 2,300 mg (milligrams) of salt (sodium) a day. If you have hypertension, you may need to reduce your sodium intake to 1,500 mg a day.  Limit alcohol intake to no more than 1 drink a day for nonpregnant women and 2 drinks a day for men. One drink equals 12 oz of beer, 5 oz of wine, or 1 oz of hard liquor.  Work with your health care provider to maintain a healthy body weight or to lose weight. Ask what an ideal weight is for you.  Get at least 30 minutes of exercise that causes your heart to beat faster (aerobic exercise) most days of the week. Activities may include walking, swimming, or biking.  Work with your health care provider or diet and nutrition specialist (dietitian) to adjust your eating plan to your individual calorie needs. Reading food labels   Check food labels for the amount of sodium per serving. Choose foods with less than 5 percent of the Daily Value of sodium. Generally, foods with less than 300 mg of sodium per serving fit into this eating plan.  To find whole grains, look for the word "whole" as the first word in the ingredient list. Shopping  Buy products labeled as "low-sodium" or "no salt added."  Buy fresh foods. Avoid canned foods and premade or frozen meals. Cooking  Avoid adding salt when cooking. Use salt-free seasonings or herbs instead of table salt or sea salt. Check with your health care provider or pharmacist before using salt substitutes.  Do not fry foods. Cook foods using healthy methods such as baking, boiling, grilling, and broiling instead.  Cook with  heart-healthy oils, such as olive, canola, soybean, or sunflower oil. Meal planning  Eat a balanced diet that includes: ? 5 or more servings of fruits and vegetables each day. At each meal, try to fill half of your plate with fruits and vegetables. ? Up to 6-8 servings of whole grains each day. ? Less than 6 oz of lean meat, poultry, or fish each day. A 3-oz serving of meat is about the same size as a deck of cards. One egg equals 1 oz. ? 2 servings of low-fat dairy each day. ? A serving of nuts, seeds, or beans 5 times each week. ? Heart-healthy fats. Healthy fats called Omega-3 fatty acids are found in foods such as flaxseeds and coldwater fish, like sardines, salmon, and mackerel.  Limit how much you eat of the following: ? Canned or prepackaged foods. ? Food that is high in trans fat, such as fried foods. ? Food that is high in saturated fat, such as fatty meat. ? Sweets, desserts, sugary drinks, and other foods with added sugar. ? Full-fat dairy products.  Do not salt foods before eating.  Try to eat at least 2 vegetarian meals each week.  Eat more home-cooked food and less restaurant, buffet, and fast food.  When eating at a restaurant, ask that your food be prepared with less salt or no salt, if possible. What foods are recommended? The items listed may not be a complete list. Talk with your dietitian about   what dietary choices are best for you. Grains Whole-grain or whole-wheat bread. Whole-grain or whole-wheat pasta. Brown rice. Oatmeal. Quinoa. Bulgur. Whole-grain and low-sodium cereals. Pita bread. Low-fat, low-sodium crackers. Whole-wheat flour tortillas. Vegetables Fresh or frozen vegetables (raw, steamed, roasted, or grilled). Low-sodium or reduced-sodium tomato and vegetable juice. Low-sodium or reduced-sodium tomato sauce and tomato paste. Low-sodium or reduced-sodium canned vegetables. Fruits All fresh, dried, or frozen fruit. Canned fruit in natural juice (without  added sugar). Meat and other protein foods Skinless chicken or turkey. Ground chicken or turkey. Pork with fat trimmed off. Fish and seafood. Egg whites. Dried beans, peas, or lentils. Unsalted nuts, nut butters, and seeds. Unsalted canned beans. Lean cuts of beef with fat trimmed off. Low-sodium, lean deli meat. Dairy Low-fat (1%) or fat-free (skim) milk. Fat-free, low-fat, or reduced-fat cheeses. Nonfat, low-sodium ricotta or cottage cheese. Low-fat or nonfat yogurt. Low-fat, low-sodium cheese. Fats and oils Soft margarine without trans fats. Vegetable oil. Low-fat, reduced-fat, or light mayonnaise and salad dressings (reduced-sodium). Canola, safflower, olive, soybean, and sunflower oils. Avocado. Seasoning and other foods Herbs. Spices. Seasoning mixes without salt. Unsalted popcorn and pretzels. Fat-free sweets. What foods are not recommended? The items listed may not be a complete list. Talk with your dietitian about what dietary choices are best for you. Grains Baked goods made with fat, such as croissants, muffins, or some breads. Dry pasta or rice meal packs. Vegetables Creamed or fried vegetables. Vegetables in a cheese sauce. Regular canned vegetables (not low-sodium or reduced-sodium). Regular canned tomato sauce and paste (not low-sodium or reduced-sodium). Regular tomato and vegetable juice (not low-sodium or reduced-sodium). Pickles. Olives. Fruits Canned fruit in a light or heavy syrup. Fried fruit. Fruit in cream or butter sauce. Meat and other protein foods Fatty cuts of meat. Ribs. Fried meat. Bacon. Sausage. Bologna and other processed lunch meats. Salami. Fatback. Hotdogs. Bratwurst. Salted nuts and seeds. Canned beans with added salt. Canned or smoked fish. Whole eggs or egg yolks. Chicken or turkey with skin. Dairy Whole or 2% milk, cream, and half-and-half. Whole or full-fat cream cheese. Whole-fat or sweetened yogurt. Full-fat cheese. Nondairy creamers. Whipped toppings.  Processed cheese and cheese spreads. Fats and oils Butter. Stick margarine. Lard. Shortening. Ghee. Bacon fat. Tropical oils, such as coconut, palm kernel, or palm oil. Seasoning and other foods Salted popcorn and pretzels. Onion salt, garlic salt, seasoned salt, table salt, and sea salt. Worcestershire sauce. Tartar sauce. Barbecue sauce. Teriyaki sauce. Soy sauce, including reduced-sodium. Steak sauce. Canned and packaged gravies. Fish sauce. Oyster sauce. Cocktail sauce. Horseradish that you find on the shelf. Ketchup. Mustard. Meat flavorings and tenderizers. Bouillon cubes. Hot sauce and Tabasco sauce. Premade or packaged marinades. Premade or packaged taco seasonings. Relishes. Regular salad dressings. Where to find more information:  National Heart, Lung, and Blood Institute: www.nhlbi.nih.gov  American Heart Association: www.heart.org Summary  The DASH eating plan is a healthy eating plan that has been shown to reduce high blood pressure (hypertension). It may also reduce your risk for type 2 diabetes, heart disease, and stroke.  With the DASH eating plan, you should limit salt (sodium) intake to 2,300 mg a day. If you have hypertension, you may need to reduce your sodium intake to 1,500 mg a day.  When on the DASH eating plan, aim to eat more fresh fruits and vegetables, whole grains, lean proteins, low-fat dairy, and heart-healthy fats.  Work with your health care provider or diet and nutrition specialist (dietitian) to adjust your eating plan to your   individual calorie needs. This information is not intended to replace advice given to you by your health care provider. Make sure you discuss any questions you have with your health care provider. Document Revised: 05/15/2017 Document Reviewed: 05/26/2016 Elsevier Patient Education  2020 Elsevier Inc.  

## 2019-08-22 NOTE — Assessment & Plan Note (Signed)
Well controlled, no changes to meds. Encouraged heart healthy diet such as the DASH diet and exercise as tolerated.  °

## 2019-08-22 NOTE — Assessment & Plan Note (Signed)
History of No new symptoms

## 2019-08-22 NOTE — Assessment & Plan Note (Signed)
He will f/u with ortho

## 2019-08-22 NOTE — Assessment & Plan Note (Signed)
F/u ortho  If nothing can be done he will need pain management

## 2019-08-22 NOTE — Assessment & Plan Note (Signed)
Per endo °

## 2019-08-22 NOTE — Assessment & Plan Note (Signed)
Back , neck and shoulder Pt will f/u ortho and many need pain management

## 2019-08-22 NOTE — Progress Notes (Signed)
Patient ID: Chase [name], male    DOB: [birth year]  Age: 59 y.o. MRN: [id number]    Subjective:  Subjective  HPI Chase [name] presents for f/u pain meds and has mult questiong today.  He needs to f/u with ortho.  Pt never got pain mangement app.  She c/o discoloration on his feel , bruising on his arm  Review of Systems  Constitutional: Negative for fever.  HENT: Negative for congestion.   Respiratory: Negative for cough and shortness of breath.   Cardiovascular: Negative for chest pain, palpitations and leg swelling.  Gastrointestinal: Negative for vomiting.  Musculoskeletal: Positive for back pain and neck pain.  Skin: Negative for rash.  Neurological: Negative for headaches.  Hematological: Bruises/bleeds easily.    History Past Medical History:  Diagnosis Date  . Allergy   . Arthritis   . Blood in stool   . Brain cyst   . Chest pain   . Constipation   . Diabetes mellitus without complication ([place])   . Difficulty urinating   . GERD (gastroesophageal reflux disease)   . Headache(784.0)   . Hearing loss   . Hyperlipidemia   . Hypertension   . Leg swelling   . Nasal congestion   . PONV (postoperative nausea and vomiting)   . Rectal pain   . Rectal ulcer with bleeding after hemorrhoid banding 02/11/2015  . Sleep apnea   . Stroke ([place])   . Stroke ([place])   . Thrombosed hemorrhoids   . Trouble swallowing     He has a past surgical history that includes Cataract extraction; Inner ear surgery; Eye surgery ([place]); Inner ear surgery (1992); surgery - right arm; and Flexible sigmoidoscopy (N/A, 02/11/2015).   His family history includes Breast cancer (age of onset: 77) in his mother; Heart attack in his father; Heart disease in his father; Huntington's disease in his mother; Hypertension in his brother and father; Stroke in his father.He reports that he has never smoked. He has never used smokeless tobacco. He reports that he does not drink alcohol or use  drugs.  Current Outpatient Medications on File Prior to Visit  Medication Sig Dispense Refill  . amLODipine (NORVASC) 5 MG tablet TAKE 1 TABLET BY MOUTH EVERY DAY 90 tablet 1  . Azelastine HCl 0.15 % SOLN Two sprays each nostril 1-2 times a day as needed. 30 mL 5  . BAYER MICROLET LANCETS lancets Use 1x a day 100 each 4  . betamethasone dipropionate (DIPROLENE) 0.05 % ointment Apply topically 2 (two) times daily. 30 g 0  . clotrimazole-betamethasone (LOTRISONE) cream Apply 1 application topically 2 (two) times daily. 30 g 0  . Continuous Blood Gluc Receiver (FREESTYLE LIBRE 14 DAY READER) DEVI 1 Device by Does not apply route every 14 (fourteen) days. 1 Device 0  . Continuous Blood Gluc Sensor (FREESTYLE LIBRE 14 DAY SENSOR) MISC 1 each by Subdermal route every 14 (fourteen) days. 6 each 2  . CONTOUR NEXT TEST test strip USE ONCE DAILY - E11.51, E11.65 DISPENSE CONTOUR NEXT STRIPS 100 strip 3  . cyclobenzaprine (FLEXERIL) 10 MG tablet TAKE 1 TABLET BY MOUTH AT BEDTIME AS NEEDED FOR MUSCLE SPASMS 30 tablet 1  . cyproheptadine (PERIACTIN) 4 MG tablet Take 1 tablet (4 mg total) by mouth at bedtime. 30 tablet 0  . diphenhydrAMINE (BENADRYL) 25 MG tablet     . empagliflozin (JARDIANCE) 25 MG TABS tablet Take 25 mg by mouth daily. 90 tablet 3  . EPINEPHrine 0.3 mg/0.3 mL  IJ SOAJ injection Use as directed for severe allergic reaction. 2 each 3  . famotidine (PEPCID) 40 MG/5ML suspension TAKE 2.5 MLS (20 MG TOTAL) BY MOUTH DAILY. 100 mL 2  . FIBER SELECT GUMMIES PO Take 1 tablet by mouth daily.    . finasteride (PROSCAR) 5 MG tablet Take 5 mg by mouth daily.    Marland Kitchen gabapentin (NEURONTIN) 100 MG capsule TAKE 1 CAPSULE BY MOUTH THREE TIMES A DAY 90 capsule 3  . gentamicin cream (GARAMYCIN) 0.1 % Apply 1 application topically 3 (three) times daily. 30 g 1  . GuaiFENesin 200 MG/10ML SOLN     . hydrOXYzine (ATARAX/VISTARIL) 25 MG tablet TAKE 1 TABLET BY MOUTH EVERY 8 HOURS AS NEEDED FOR ANXIETY OR ITCHING  270 tablet 1  . Ibuprofen-Acetaminophen (ADVIL DUAL ACTION) 125-250 MG TABS Take 2 tablets by mouth daily.    Marland Kitchen levocetirizine (XYZAL) 5 MG tablet Take 1 tablet (5 mg total) by mouth daily. 30 tablet 5  . metoprolol tartrate (LOPRESSOR) 25 MG tablet 1 po bid 180 tablet 1  . mometasone (ELOCON) 0.1 % ointment APPLY TO AFFECTED AREA EVERY DAY AS NEEDED FOR RASH 45 g 0  . Multiple Vitamins-Minerals (AIRBORNE GUMMIES PO) Take 1 each by mouth daily.    . NONFORMULARY OR COMPOUNDED ITEM Compression socks  #1   15-20 mm/hg  Dx edema 1 each 1  . ondansetron (ZOFRAN-ODT) 4 MG disintegrating tablet USE UP TO THREE TIMES A DAY AS NEEDED FOR NAUSEA. 10 tablet 0  . pravastatin (PRAVACHOL) 20 MG tablet TAKE 1 TABLET BY MOUTH EVERY DAY 90 tablet 0  . PROCTOFOAM HC rectal foam APPLY FOAM THREE TIMES A DAY AS NEEDED FOR 7 DAYS 10 g 10  . sitaGLIPtin (JANUVIA) 100 MG tablet Take 1 tablet (100 mg total) by mouth daily. 30 tablet 2  . terazosin (HYTRIN) 5 MG capsule Take by mouth daily.  3  . triamcinolone cream (KENALOG) 0.1 % Apply 1 application topically 2 (two) times daily. 30 g 0  . venlafaxine XR (EFFEXOR-XR) 75 MG 24 hr capsule TAKE 1 CAPSULE (75 MG TOTAL) BY MOUTH DAILY WITH BREAKFAST. 90 capsule 1  . budesonide (PULMICORT) 0.5 MG/2ML nebulizer solution Mix with 240 cc saline in the [facility] bottle as directed and rinse sinuses twice a day. (Patient not taking: Reported on 08/08/2019) 120 mL 5   No current facility-administered medications on file prior to visit.     Objective:  Objective  Physical Exam Vitals and nursing note reviewed.  Constitutional:      General: He is sleeping.     Appearance: He is well-developed.  HENT:     Head: Normocephalic and atraumatic.  Eyes:     Pupils: Pupils are equal, round, and reactive to light.  Neck:     Thyroid: No thyromegaly.  Cardiovascular:     Rate and Rhythm: Normal rate and regular rhythm.     Heart sounds: No murmur.  Pulmonary:     Effort:  Pulmonary effort is normal. No respiratory distress.     Breath sounds: Normal breath sounds. No wheezing or rales.  Chest:     Chest wall: No tenderness.  Musculoskeletal:        General: No tenderness.     Cervical back: Normal range of motion and neck supple.  Skin:    General: Skin is warm and dry.     Findings: Bruising present.  Neurological:     Mental Status: He is oriented to person, place,  and time.  Psychiatric:        Behavior: Behavior normal.        Thought Content: Thought content normal.        Judgment: Judgment normal.    BP (!) 100/58 (BP Location: Right Arm, Patient Position: Sitting, Cuff Size: Normal)   Pulse 89   Temp 97.9 F (36.6 C) (Temporal)   Resp 18   Ht 5' 9.5" (1.765 m)   Wt 187 lb 9.6 oz (85.1 kg)   SpO2 97%   BMI 27.31 kg/m  Wt Readings from Last 3 Encounters:  08/22/19 187 lb 9.6 oz (85.1 kg)  08/08/19 184 lb (83.5 kg)  06/16/19 184 lb (83.5 kg)     Lab Results  Component Value Date   WBC 9.5 12/10/2017   HGB 14.1 12/10/2017   HCT 41.9 12/10/2017   PLT 350.0 12/10/2017   GLUCOSE 148 (H) 05/24/2019   CHOL 168 05/24/2019   TRIG 271.0 (H) 05/24/2019   HDL 33.10 (L) 05/24/2019   LDLDIRECT 87.0 05/24/2019   LDLCALC 102 (H) 08/04/2017   ALT 13 05/24/2019   AST 13 05/24/2019   NA 142 05/24/2019   K 4.3 05/24/2019   CL 101 05/24/2019   CREATININE 0.93 05/24/2019   BUN 9 05/24/2019   CO2 33 (H) 05/24/2019   TSH 2.44 05/24/2019   PSA 1.01 10/03/2014   INR 1.11 02/10/2015   HGBA1C 8.3 (H) 05/24/2019   MICROALBUR 0.7 08/04/2017    ECHOCARDIOGRAM COMPLETE  Result Date: 06/15/2019   ECHOCARDIOGRAM REPORT   Patient Name:   ZADIEL BREDAHL Date of Exam: 06/14/2019 Medical Rec #:  [id number]      Height:       69.3 in Accession #:    [id number]     Weight:       184.0 lb Date of Birth:  01-20-1961      BSA:          2.00 m Patient Age:    23 years       BP:           128/88 mmHg Patient Gender: M              HR:           88 bpm. Exam  Location:  [facility] Procedure: 2D Echo Indications:    I10 Hypertension. Chest Discomfort R07.89. Palpitations R00.2  History:        Patient has prior history of Echocardiogram examinations.  Sonographer:    Jannett Celestine RDCS (AE) Referring Phys: [place] Ferrari [place]  1. Left ventricular ejection fraction, by visual estimation, is 60 to 65%. There is mildly increased left ventricular hypertrophy.  2. Left ventricular diastolic parameters are consistent with Grade I diastolic dysfunction (impaired relaxation).  3. Left atrial size was normal.  4. Right atrial size was normal.  5. The mitral valve is normal in structure. Trivial mitral valve regurgitation.  6. The tricuspid valve is normal in structure.  7. The aortic valve is normal in structure. FINDINGS  Left Ventricle: Left ventricular ejection fraction, by visual estimation, is 60 to 65%. The left ventricle has normal function. The left ventricle has no regional wall motion abnormalities. There is mildly increased left ventricular hypertrophy. Left ventricular diastolic parameters are consistent with Grade I diastolic dysfunction (impaired relaxation). Normal left atrial pressure. Right Ventricle: The right ventricular size is normal. No increase in right ventricular wall thickness. Global RV systolic function is has normal  systolic function. Left Atrium: Left atrial size was normal in size. Right Atrium: Right atrial size was normal in size Pericardium: There is no evidence of pericardial effusion. Mitral Valve: The mitral valve is normal in structure. Trivial mitral valve regurgitation. No evidence of mitral valve stenosis by observation. Tricuspid Valve: The tricuspid valve is normal in structure. Tricuspid valve regurgitation is not demonstrated. Aortic Valve: The aortic valve is normal in structure. Aortic valve regurgitation is not visualized. The aortic valve is structurally normal, with no evidence of sclerosis or stenosis. Pulmonic  Valve: The pulmonic valve was normal in structure. Pulmonic valve regurgitation is not visualized. Pulmonic regurgitation is not visualized. Aorta: The aortic root, ascending aorta and aortic arch are all structurally normal, with no evidence of dilitation or obstruction. Venous: The inferior vena cava is normal in size with greater than 50% respiratory variability, suggesting right atrial pressure of 3 mmHg. IAS/Shunts: No atrial level shunt detected by color flow Doppler. There is no evidence of a patent foramen ovale. No ventricular septal defect is seen or detected. There is no evidence of an atrial septal defect.  LEFT VENTRICLE PLAX 2D LVIDd:         4.14 cm  Diastology LVIDs:         2.61 cm  LV e' lateral:   8.05 cm/s LV PW:         1.16 cm  LV E/e' lateral: 12.2 LV IVS:        1.35 cm  LV e' medial:    6.64 cm/s LVOT diam:     1.90 cm  LV E/e' medial:  14.8 LV SV:         51 ml LV SV Index:   25.12 LVOT Area:     2.84 cm  RIGHT VENTRICLE RV S prime:     12.30 cm/s TAPSE (M-mode): 2.3 cm LEFT ATRIUM             Index       RIGHT ATRIUM           Index LA diam:        3.30 cm 1.65 cm/m  RA Area:     10.80 cm LA Vol (A2C):   36.2 ml 18.11 ml/m RA Volume:   20.00 ml  10.00 ml/m LA Vol (A4C):   53.0 ml 26.51 ml/m LA Biplane Vol: 45.9 ml 22.96 ml/m  AORTIC VALVE LVOT Vmax:   124.00 cm/s LVOT Vmean:  88.100 cm/s LVOT VTI:    0.253 m  AORTA Ao Root diam: 2.90 cm MITRAL VALVE MV Area (PHT): 2.96 cm              SHUNTS MV PHT:        74.24 msec            Systemic VTI:  0.25 m MV Decel Time: 256 msec              Systemic Diam: 1.90 cm MV E velocity: 98.50 cm/s  103 cm/s MV A velocity: 141.00 cm/s 70.3 cm/s MV E/A ratio:  0.70        1.5  Jenne Campus MD Electronically signed by Jenne Campus MD Signature Date/Time: 06/15/2019/12:29:46 PM    Final      Assessment & Plan:  Plan  I have changed Dellis Filbert L. Stamey's polyethylene glycol powder. I am also having him maintain his [place] PO,  ondansetron, diphenhydrAMINE, guaiFENesin, mometasone, gentamicin cream, Multiple Vitamins-Minerals (AIRBORNE GUMMIES PO), Bayer Microlet  Lancets, terazosin, NONFORMULARY OR COMPOUNDED ITEM, betamethasone dipropionate, Proctofoam HC, FreeStyle Libre 14 Day Reader, FreeStyle Libre 14 Day Sensor, Contour Next Test, amLODipine, triamcinolone cream, venlafaxine XR, metoprolol tartrate, sitaGLIPtin, finasteride, levocetirizine, Azelastine HCl, budesonide, EPINEPHrine, clotrimazole-betamethasone, Jardiance, hydrOXYzine, pravastatin, gabapentin, famotidine, cyclobenzaprine, Advil Dual Action, cyproheptadine, allopurinol, and HYDROcodone-acetaminophen.  Meds ordered this encounter  Medications  . allopurinol (ZYLOPRIM) 100 MG tablet    Sig: Take 1 tablet (100 mg total) by mouth daily.    Dispense:  90 tablet    Refill:  1  . polyethylene glycol powder (GLYCOLAX/MIRALAX) 17 GM/SCOOP powder    Sig: TAKE 17 GRAMS BY MOUTH 2 TIMES DAILY AS NEEDED.    Dispense:  527 g    Refill:  5  . DISCONTD: HYDROcodone-acetaminophen (HYCET) 7.5-325 mg/15 ml solution    Sig: TAKE 15 ML EVERY 6 HOURS AS NEEDED FOR PAIN    Dispense:  473 mL    Refill:  0  . HYDROcodone-acetaminophen (HYCET) 7.5-325 mg/15 ml solution    Sig: TAKE 15 ML EVERY 6 HOURS AS NEEDED FOR PAIN    Dispense:  473 mL    Refill:  0    Problem List Items Addressed This Visit      Unprioritized   Chronic low back pain    He will f/u with ortho       Relevant Medications   HYDROcodone-acetaminophen (HYCET) 7.5-325 mg/15 ml solution   DM (diabetes mellitus) type II uncontrolled, periph vascular disorder (HCC)    Per endo      Essential hypertension   Hemorrhoids   Relevant Medications   polyethylene glycol powder (GLYCOLAX/MIRALAX) 17 GM/SCOOP powder   Hyperlipidemia LDL goal <70    Encouraged heart healthy diet, increase exercise, avoid trans fats, consider a krill oil cap daily      Other chronic pain    Back , neck and shoulder Pt  will f/u ortho and many need pain management       Relevant Medications   HYDROcodone-acetaminophen (HYCET) 7.5-325 mg/15 ml solution   Other Relevant Orders   Pain Mgmt, Profile 8 w/Conf, U   Right shoulder pain    F/u ortho  If nothing can be done he will need pain management       Stroke ([place])    History of No new symptoms        Other Visit Diagnoses    Chronic gout of multiple sites, unspecified cause    -  Primary   Relevant Medications   allopurinol (ZYLOPRIM) 100 MG tablet   HYDROcodone-acetaminophen (HYCET) 7.5-325 mg/15 ml solution   Hyperlipidemia, unspecified hyperlipidemia type       Bruise       Need for tetanus booster       Relevant Orders   Td vaccine greater than or equal to 7yo preservative free IM (Completed)   Type 2 diabetes mellitus with hyperglycemia, without long-term current use of insulin ([place])       Relevant Orders   Ambulatory referral to Podiatry   High risk medication use       Relevant Orders   Pain Mgmt, Profile 8 w/Conf, U      Follow-up: Return in about 3 months (around 11/22/2019).  Ann Held, DO

## 2019-08-22 NOTE — Assessment & Plan Note (Signed)
Encouraged heart healthy diet, increase exercise, avoid trans fats, consider a krill oil cap daily 

## 2019-08-23 LAB — PAIN MGMT, PROFILE 8 W/CONF, U
6 Acetylmorphine: NEGATIVE ng/mL
Alcohol Metabolites: NEGATIVE ng/mL (ref <500)
Amphetamines: NEGATIVE ng/mL
Benzodiazepines: NEGATIVE ng/mL
Buprenorphine, Urine: NEGATIVE ng/mL
Cocaine Metabolite: NEGATIVE ng/mL
Creatinine: 63 mg/dL
MDMA: NEGATIVE ng/mL
Marijuana Metabolite: NEGATIVE ng/mL
Opiates: NEGATIVE ng/mL
Oxidant: NEGATIVE ug/mL
Oxycodone: NEGATIVE ng/mL
pH: 7.3 (ref 4.5–9.0)

## 2019-08-31 ENCOUNTER — Other Ambulatory Visit: Payer: Self-pay | Admitting: Family Medicine

## 2019-08-31 ENCOUNTER — Encounter: Payer: Self-pay | Admitting: Family Medicine

## 2019-08-31 DIAGNOSIS — E785 Hyperlipidemia, unspecified: Secondary | ICD-10-CM

## 2019-08-31 DIAGNOSIS — I10 Essential (primary) hypertension: Secondary | ICD-10-CM

## 2019-09-01 ENCOUNTER — Encounter: Payer: Self-pay | Admitting: Family Medicine

## 2019-09-01 ENCOUNTER — Ambulatory Visit (INDEPENDENT_AMBULATORY_CARE_PROVIDER_SITE_OTHER): Payer: BC Managed Care – PPO | Admitting: Family Medicine

## 2019-09-01 ENCOUNTER — Other Ambulatory Visit: Payer: Self-pay

## 2019-09-01 VITALS — BP 108/70 | HR 87 | Temp 97.4°F | Resp 18 | Ht 69.5 in | Wt 189.0 lb

## 2019-09-01 DIAGNOSIS — S20469A Insect bite (nonvenomous) of unspecified back wall of thorax, initial encounter: Secondary | ICD-10-CM | POA: Diagnosis not present

## 2019-09-01 MED ORDER — DOXYCYCLINE HYCLATE 100 MG PO TABS: 100.0000 mg | ORAL_TABLET | Freq: Two times a day (BID) | ORAL | 0 refills | Status: DC

## 2019-09-01 MED FILL — DOXYCYCLINE HYCLATE 100 MG: 100 | 10 days supply | Qty: 20 | Fill #0

## 2019-09-01 NOTE — Patient Instructions (Signed)
Insect Bite, Adult An insect bite can make your skin red, itchy, and swollen. Some insects can spread disease to people with a bite. However, most insect bites do not lead to disease, and most are not serious. What are the causes? Insects may bite for many reasons, including:  Hunger.  To defend themselves. Insects that bite include:  Spiders.  Mosquitoes.  Ticks.  Fleas.  Ants.  Flies.  Kissing bugs.  Chiggers. What are the signs or symptoms? Symptoms of this condition include:  Itching or pain in the bite area.  Redness and swelling in the bite area.  An open wound (skin ulcer). Symptoms often last for 2-4 days. In rare cases, a person may have a very bad allergic reaction (anaphylactic reaction) to a bite. Symptoms of an anaphylactic reaction may include:  Feeling warm in the face (flushed). Your face may turn red.  Itchy, red, swollen areas of skin (hives).  Swelling of the: ? Eyes. ? Lips. ? Face. ? Mouth. ? Tongue. ? Throat.  Trouble with any of these: ? Breathing. ? Talking. ? Swallowing.  Loud breathing (wheezing).  Feeling dizzy or light-headed.  Passing out (fainting).  Pain or cramps in your belly.  Throwing up (vomiting).  Watery poop (diarrhea). How is this treated? Treatment is usually not needed. Symptoms often go away on their own. When treatment is needed, it may involve:  Putting a cream or lotion on the bite area. This helps with itching.  Taking an antibiotic medicine. This treatment is needed if the bite area gets infected.  Getting a tetanus shot, if you are not up to date on this vaccine.  Putting ice on the affected area.  Using medicines called antihistamines. This treatment may be needed if you have itching or an allergic reaction to the insect bite.  Giving yourself a shot of medicine (epinephrine) using an auto-injector "pen" if you have an anaphylactic reaction to a bite. Your doctor will teach you how to use  this pen. Follow these instructions at home: Bite area care   Do not scratch the bite area.  Keep the bite area clean and dry.  Wash the bite area every day with soap and water as told by your doctor.  Check the bite area every day for signs of infection. Check for: ? Redness, swelling, or pain. ? Fluid or blood. ? Warmth. ? Pus or a bad smell. Managing pain, itching, and swelling   You may put any of these on the bite area as told by your doctor: ? A paste made of baking soda and water. ? Cortisone cream. ? Calamine lotion.  If told, put ice on the bite area. ? Put ice in a plastic bag. ? Place a towel between your skin and the bag. ? Leave the ice on for 20 minutes, 2-3 times a day. General instructions  Apply or take over-the-counter and prescription medicines only as told by your doctor.  If you were prescribed an antibiotic medicine, take or apply it as told by your doctor. Do not stop using the antibiotic even if your condition improves.  Keep all follow-up visits as told by your doctor. This is important. How is this prevented? To help you have a lower risk of insect bites:  When you are outside, wear clothing that covers your arms and legs.  Use insect repellent. The best insect repellents contain one of these: ? DEET. ? Picaridin. ? Oil of lemon eucalyptus (OLE). ? [id number].  Consider spraying your   clothing with a pesticide called permethrin. Permethrin helps prevent insect bites. It works for several weeks and for up to 5-6 clothing washes. Do not apply permethrin directly to the skin.  If your home windows do not have screens, think about putting some in.  If you will be sleeping in an area where there are mosquitoes, consider covering your sleeping area with a mosquito net. Contact a doctor if:  You have redness, swelling, or pain in the bite area.  You have fluid or blood coming from the bite area.  The bite area feels warm to the touch.  You have  pus or a bad smell coming from the bite area.  You have a fever. Get help right away if:  You have joint pain.  You have a rash.  You feel more tired or sleepy than you normally do.  You have neck pain.  You have a headache.  You feel weaker than you normally do.  You have signs of an anaphylactic reaction. Signs may include: ? Feeling warm in the face. ? Itchy, red, swollen areas of skin. ? Swelling of your:  Eyes.  Lips.  Face.  Mouth.  Tongue.  Throat. ? Trouble with any of these:  Breathing.  Talking.  Swallowing. ? Loud breathing. ? Feeling dizzy or light-headed. ? Passing out. ? Pain or cramps in your belly. ? Throwing up. ? Watery poop. These symptoms may be an emergency. Do not wait to see if the symptoms will go away. Do this right away:  Use your auto-injector pen as you have been told.  Get medical help. Call your local emergency services (911 in the [place] Do not drive yourself to the hospital. Summary  An insect bite can make your skin red, itchy, and swollen.  Treatment is usually not needed. Symptoms often go away on their own.  Do not scratch the bite area. Keep it clean and dry.  Ice can help with pain and itching from the bite. This information is not intended to replace advice given to you by your health care provider. Make sure you discuss any questions you have with your health care provider. Document Revised: 12/11/2017 Document Reviewed: 12/11/2017 Elsevier Patient Education  2020 [employer] Inc.  

## 2019-09-01 NOTE — Progress Notes (Signed)
Patient ID: Chase [name], male    DOB: May 04, 1961  Age: 59 y.o. MRN: [id number]    Subjective:  Subjective  HPI NOAAH SCHUELE presents for insect bite on Lside upper back.  He felt it yesterday  It felt like a bee sting or spider bite per pt Review of Systems  Constitutional: Negative for appetite change, diaphoresis, fatigue and unexpected weight change.  Eyes: Negative for pain, redness and visual disturbance.  Respiratory: Negative for cough, chest tightness, shortness of breath and wheezing.   Cardiovascular: Negative for chest pain, palpitations and leg swelling.  Endocrine: Negative for cold intolerance, heat intolerance, polydipsia, polyphagia and polyuria.  Genitourinary: Negative for difficulty urinating, dysuria and frequency.  Skin: Positive for wound.  Neurological: Negative for dizziness, light-headedness, numbness and headaches.             History Past Medical History:  Diagnosis Date  . Allergy   . Arthritis   . Blood in stool   . Brain cyst   . Chest pain   . Constipation   . Diabetes mellitus without complication ([place])   . Difficulty urinating   . GERD (gastroesophageal reflux disease)   . Headache(784.0)   . Hearing loss   . Hyperlipidemia   . Hypertension   . Leg swelling   . Nasal congestion   . PONV (postoperative nausea and vomiting)   . Rectal pain   . Rectal ulcer with bleeding after hemorrhoid banding 02/11/2015  . Sleep apnea   . Stroke ([place])   . Stroke ([place])   . Thrombosed hemorrhoids   . Trouble swallowing     He has a past surgical history that includes Cataract extraction; Inner ear surgery; Eye surgery ([place]); Inner ear surgery (1992); surgery - right arm; and Flexible sigmoidoscopy (N/A, 02/11/2015).   His family history includes Breast cancer (age of onset: 33) in his mother; Heart attack in his father; Heart disease in his father; Huntington's disease in his mother; Hypertension in his brother and father; Stroke  in his father.He reports that he has never smoked. He has never used smokeless tobacco. He reports that he does not drink alcohol or use drugs.  Current Outpatient Medications on File Prior to Visit  Medication Sig Dispense Refill  . allopurinol (ZYLOPRIM) 100 MG tablet Take 1 tablet (100 mg total) by mouth daily. 90 tablet 1  . amLODipine (NORVASC) 5 MG tablet TAKE 1 TABLET BY MOUTH EVERY DAY 90 tablet 0  . Azelastine HCl 0.15 % SOLN Two sprays each nostril 1-2 times a day as needed. 30 mL 5  . BAYER MICROLET LANCETS lancets Use 1x a day 100 each 4  . betamethasone dipropionate (DIPROLENE) 0.05 % ointment Apply topically 2 (two) times daily. 30 g 0  . clotrimazole-betamethasone (LOTRISONE) cream Apply 1 application topically 2 (two) times daily. 30 g 0  . Continuous Blood Gluc Receiver (FREESTYLE LIBRE 14 DAY READER) DEVI 1 Device by Does not apply route every 14 (fourteen) days. 1 Device 0  . Continuous Blood Gluc Sensor (FREESTYLE LIBRE 14 DAY SENSOR) MISC 1 each by Subdermal route every 14 (fourteen) days. 6 each 2  . CONTOUR NEXT TEST test strip USE ONCE DAILY - E11.51, E11.65 DISPENSE CONTOUR NEXT STRIPS 100 strip 3  . cyclobenzaprine (FLEXERIL) 10 MG tablet TAKE 1 TABLET BY MOUTH AT BEDTIME AS NEEDED FOR MUSCLE SPASMS 30 tablet 1  . cyproheptadine (PERIACTIN) 4 MG tablet Take 1 tablet (4 mg total) by mouth at bedtime.  30 tablet 0  . diphenhydrAMINE (BENADRYL) 25 MG tablet     . empagliflozin (JARDIANCE) 25 MG TABS tablet Take 25 mg by mouth daily. 90 tablet 3  . EPINEPHrine 0.3 mg/0.3 mL IJ SOAJ injection Use as directed for severe allergic reaction. 2 each 3  . famotidine (PEPCID) 40 MG/5ML suspension TAKE 2.5 MLS (20 MG TOTAL) BY MOUTH DAILY. 100 mL 2  . FIBER SELECT GUMMIES PO Take 1 tablet by mouth daily.    . finasteride (PROSCAR) 5 MG tablet Take 5 mg by mouth daily.    Marland Kitchen gabapentin (NEURONTIN) 100 MG capsule TAKE 1 CAPSULE BY MOUTH THREE TIMES A DAY 90 capsule 3  . gentamicin  cream (GARAMYCIN) 0.1 % Apply 1 application topically 3 (three) times daily. 30 g 1  . GuaiFENesin 200 MG/10ML SOLN     . HYDROcodone-acetaminophen (HYCET) 7.5-325 mg/15 ml solution TAKE 15 ML EVERY 6 HOURS AS NEEDED FOR PAIN 473 mL 0  . hydrOXYzine (ATARAX/VISTARIL) 25 MG tablet TAKE 1 TABLET BY MOUTH EVERY 8 HOURS AS NEEDED FOR ANXIETY OR ITCHING 270 tablet 1  . Ibuprofen-Acetaminophen (ADVIL DUAL ACTION) 125-250 MG TABS Take 2 tablets by mouth daily.    Marland Kitchen levocetirizine (XYZAL) 5 MG tablet Take 1 tablet (5 mg total) by mouth daily. 30 tablet 5  . metoprolol tartrate (LOPRESSOR) 25 MG tablet 1 po bid 180 tablet 1  . mometasone (ELOCON) 0.1 % ointment APPLY TO AFFECTED AREA EVERY DAY AS NEEDED FOR RASH 45 g 0  . Multiple Vitamins-Minerals (AIRBORNE GUMMIES PO) Take 1 each by mouth daily.    . NONFORMULARY OR COMPOUNDED ITEM Compression socks  #1   15-20 mm/hg  Dx edema 1 each 1  . ondansetron (ZOFRAN-ODT) 4 MG disintegrating tablet USE UP TO THREE TIMES A DAY AS NEEDED FOR NAUSEA. 10 tablet 0  . polyethylene glycol powder (GLYCOLAX/MIRALAX) 17 GM/SCOOP powder TAKE 17 GRAMS BY MOUTH 2 TIMES DAILY AS NEEDED. 527 g 5  . pravastatin (PRAVACHOL) 20 MG tablet TAKE 1 TABLET BY MOUTH EVERY DAY 90 tablet 0  . PROCTOFOAM HC rectal foam APPLY FOAM THREE TIMES A DAY AS NEEDED FOR 7 DAYS 10 g 10  . sitaGLIPtin (JANUVIA) 100 MG tablet Take 1 tablet (100 mg total) by mouth daily. 30 tablet 2  . terazosin (HYTRIN) 5 MG capsule Take by mouth daily.  3  . triamcinolone cream (KENALOG) 0.1 % Apply 1 application topically 2 (two) times daily. 30 g 0  . venlafaxine XR (EFFEXOR-XR) 75 MG 24 hr capsule TAKE 1 CAPSULE (75 MG TOTAL) BY MOUTH DAILY WITH BREAKFAST. 90 capsule 1  . budesonide (PULMICORT) 0.5 MG/2ML nebulizer solution Mix with 240 cc saline in the [facility] bottle as directed and rinse sinuses twice a day. (Patient not taking: Reported on 08/08/2019) 120 mL 5   No current facility-administered medications on  file prior to visit.     Objective:  Objective  Physical Exam Vitals and nursing note reviewed.  Skin:    Findings: Rash present. Rash is papular.         BP 108/70 (BP Location: Left Arm, Patient Position: Sitting, Cuff Size: Normal)   Pulse 87   Temp (!) 97.4 F (36.3 C) (Temporal)   Resp 18   Ht 5' 9.5" (1.765 m)   Wt 189 lb (85.7 kg)   SpO2 98%   BMI 27.51 kg/m  Wt Readings from Last 3 Encounters:  09/01/19 189 lb (85.7 kg)  08/22/19 187 lb 9.6 oz (  85.1 kg)  08/08/19 184 lb (83.5 kg)     Lab Results  Component Value Date   WBC 9.5 12/10/2017   HGB 14.1 12/10/2017   HCT 41.9 12/10/2017   PLT 350.0 12/10/2017   GLUCOSE 148 (H) 05/24/2019   CHOL 168 05/24/2019   TRIG 271.0 (H) 05/24/2019   HDL 33.10 (L) 05/24/2019   LDLDIRECT 87.0 05/24/2019   LDLCALC 102 (H) 08/04/2017   ALT 13 05/24/2019   AST 13 05/24/2019   NA 142 05/24/2019   K 4.3 05/24/2019   CL 101 05/24/2019   CREATININE 0.93 05/24/2019   BUN 9 05/24/2019   CO2 33 (H) 05/24/2019   TSH 2.44 05/24/2019   PSA 1.01 10/03/2014   INR 1.11 02/10/2015   HGBA1C 8.3 (H) 05/24/2019   MICROALBUR 0.7 08/04/2017    ECHOCARDIOGRAM COMPLETE  Result Date: 06/15/2019   ECHOCARDIOGRAM REPORT   Patient Name:   CASEY SADLOWSKI Date of Exam: 06/14/2019 Medical Rec #:  [id number]      Height:       69.3 in Accession #:    [id number]     Weight:       184.0 lb Date of Birth:  1961-04-16      BSA:          2.00 m Patient Age:    17 years       BP:           128/88 mmHg Patient Gender: M              HR:           88 bpm. Exam Location:  [facility] Procedure: 2D Echo Indications:    I10 Hypertension. Chest Discomfort R07.89. Palpitations R00.2  History:        Patient has prior history of Echocardiogram examinations.  Sonographer:    Jannett Celestine RDCS (AE) Referring Phys: Waverly Ferrari Craig  1. Left ventricular ejection fraction, by visual estimation, is 60 to 65%. There is mildly increased left ventricular  hypertrophy.  2. Left ventricular diastolic parameters are consistent with Grade I diastolic dysfunction (impaired relaxation).  3. Left atrial size was normal.  4. Right atrial size was normal.  5. The mitral valve is normal in structure. Trivial mitral valve regurgitation.  6. The tricuspid valve is normal in structure.  7. The aortic valve is normal in structure. FINDINGS  Left Ventricle: Left ventricular ejection fraction, by visual estimation, is 60 to 65%. The left ventricle has normal function. The left ventricle has no regional wall motion abnormalities. There is mildly increased left ventricular hypertrophy. Left ventricular diastolic parameters are consistent with Grade I diastolic dysfunction (impaired relaxation). Normal left atrial pressure. Right Ventricle: The right ventricular size is normal. No increase in right ventricular wall thickness. Global RV systolic function is has normal systolic function. Left Atrium: Left atrial size was normal in size. Right Atrium: Right atrial size was normal in size Pericardium: There is no evidence of pericardial effusion. Mitral Valve: The mitral valve is normal in structure. Trivial mitral valve regurgitation. No evidence of mitral valve stenosis by observation. Tricuspid Valve: The tricuspid valve is normal in structure. Tricuspid valve regurgitation is not demonstrated. Aortic Valve: The aortic valve is normal in structure. Aortic valve regurgitation is not visualized. The aortic valve is structurally normal, with no evidence of sclerosis or stenosis. Pulmonic Valve: The pulmonic valve was normal in structure. Pulmonic valve regurgitation is not visualized. Pulmonic regurgitation is not visualized.  Aorta: The aortic root, ascending aorta and aortic arch are all structurally normal, with no evidence of dilitation or obstruction. Venous: The inferior vena cava is normal in size with greater than 50% respiratory variability, suggesting right atrial pressure of 3  mmHg. IAS/Shunts: No atrial level shunt detected by color flow Doppler. There is no evidence of a patent foramen ovale. No ventricular septal defect is seen or detected. There is no evidence of an atrial septal defect.  LEFT VENTRICLE PLAX 2D LVIDd:         4.14 cm  Diastology LVIDs:         2.61 cm  LV e' lateral:   8.05 cm/s LV PW:         1.16 cm  LV E/e' lateral: 12.2 LV IVS:        1.35 cm  LV e' medial:    6.64 cm/s LVOT diam:     1.90 cm  LV E/e' medial:  14.8 LV SV:         51 ml LV SV Index:   25.12 LVOT Area:     2.84 cm  RIGHT VENTRICLE RV S prime:     12.30 cm/s TAPSE (M-mode): 2.3 cm LEFT ATRIUM             Index       RIGHT ATRIUM           Index LA diam:        3.30 cm 1.65 cm/m  RA Area:     10.80 cm LA Vol (A2C):   36.2 ml 18.11 ml/m RA Volume:   20.00 ml  10.00 ml/m LA Vol (A4C):   53.0 ml 26.51 ml/m LA Biplane Vol: 45.9 ml 22.96 ml/m  AORTIC VALVE LVOT Vmax:   124.00 cm/s LVOT Vmean:  88.100 cm/s LVOT VTI:    0.253 m  AORTA Ao Root diam: 2.90 cm MITRAL VALVE MV Area (PHT): 2.96 cm              SHUNTS MV PHT:        74.24 msec            Systemic VTI:  0.25 m MV Decel Time: 256 msec              Systemic Diam: 1.90 cm MV E velocity: 98.50 cm/s  103 cm/s MV A velocity: 141.00 cm/s 70.3 cm/s MV E/A ratio:  0.70        1.5  Jenne Campus MD Electronically signed by Jenne Campus MD Signature Date/Time: 06/15/2019/12:29:46 PM    Final      Assessment & Plan:  Plan  I am having [name] start on doxycycline. I am also having him maintain his [place] PO, ondansetron, diphenhydrAMINE, guaiFENesin, mometasone, gentamicin cream, Multiple Vitamins-Minerals (AIRBORNE GUMMIES PO), Bayer Microlet Lancets, terazosin, NONFORMULARY OR COMPOUNDED ITEM, betamethasone dipropionate, Proctofoam HC, FreeStyle Libre 14 Day Reader, FreeStyle Libre 14 Day Sensor, Contour Next Test, triamcinolone cream, venlafaxine XR, metoprolol tartrate, sitaGLIPtin, finasteride, levocetirizine,  Azelastine HCl, budesonide, EPINEPHrine, clotrimazole-betamethasone, Jardiance, hydrOXYzine, gabapentin, famotidine, cyclobenzaprine, Advil Dual Action, cyproheptadine, allopurinol, polyethylene glycol powder, HYDROcodone-acetaminophen, amLODipine, and pravastatin.  Meds ordered this encounter  Medications  . doxycycline (VIBRA-TABS) 100 MG tablet    Sig: Take 1 tablet (100 mg total) by mouth 2 (two) times daily.    Dispense:  20 tablet    Refill:  0    Problem List Items Addressed This Visit    None  Visit Diagnoses    Insect bite of back, unspecified laterality, initial encounter    -  Primary   Relevant Medications   doxycycline (VIBRA-TABS) 100 MG tablet     abx oint with bandaid placed Tetanus utd   Follow-up: Return if symptoms worsen or fail to improve.  Ann Held, DO

## 2019-09-02 ENCOUNTER — Ambulatory Visit: Payer: BC Managed Care – PPO | Admitting: Cardiology

## 2019-09-02 ENCOUNTER — Ambulatory Visit: Payer: BC Managed Care – PPO | Admitting: Family Medicine

## 2019-09-05 ENCOUNTER — Other Ambulatory Visit: Payer: Self-pay | Admitting: Neurology

## 2019-09-07 DIAGNOSIS — Z79899 Other long term (current) drug therapy: Secondary | ICD-10-CM | POA: Diagnosis not present

## 2019-09-07 DIAGNOSIS — M545 Low back pain: Secondary | ICD-10-CM | POA: Diagnosis not present

## 2019-09-07 DIAGNOSIS — M542 Cervicalgia: Secondary | ICD-10-CM | POA: Diagnosis not present

## 2019-09-07 DIAGNOSIS — Z79891 Long term (current) use of opiate analgesic: Secondary | ICD-10-CM | POA: Diagnosis not present

## 2019-09-07 DIAGNOSIS — G894 Chronic pain syndrome: Secondary | ICD-10-CM | POA: Diagnosis not present

## 2019-09-14 ENCOUNTER — Encounter: Payer: Self-pay | Admitting: Family Medicine

## 2019-09-19 ENCOUNTER — Encounter: Payer: Self-pay | Admitting: Internal Medicine

## 2019-09-19 ENCOUNTER — Other Ambulatory Visit: Payer: Self-pay

## 2019-09-19 ENCOUNTER — Ambulatory Visit (INDEPENDENT_AMBULATORY_CARE_PROVIDER_SITE_OTHER): Payer: BC Managed Care – PPO | Admitting: Podiatry

## 2019-09-19 DIAGNOSIS — E0843 Diabetes mellitus due to underlying condition with diabetic autonomic (poly)neuropathy: Secondary | ICD-10-CM

## 2019-09-19 DIAGNOSIS — T7840XD Allergy, unspecified, subsequent encounter: Secondary | ICD-10-CM

## 2019-09-19 MED ORDER — JARDIANCE 25 MG PO TABS: 25.0000 mg | ORAL_TABLET | Freq: Every day | ORAL | 3 refills | Status: DC

## 2019-09-20 ENCOUNTER — Other Ambulatory Visit: Payer: Self-pay | Admitting: Family Medicine

## 2019-09-20 ENCOUNTER — Ambulatory Visit (INDEPENDENT_AMBULATORY_CARE_PROVIDER_SITE_OTHER): Payer: BC Managed Care – PPO | Admitting: Cardiology

## 2019-09-20 ENCOUNTER — Ambulatory Visit (INDEPENDENT_AMBULATORY_CARE_PROVIDER_SITE_OTHER): Payer: BC Managed Care – PPO

## 2019-09-20 ENCOUNTER — Encounter: Payer: Self-pay | Admitting: Cardiology

## 2019-09-20 VITALS — BP 112/78 | HR 90 | Ht 69.5 in | Wt 186.0 lb

## 2019-09-20 DIAGNOSIS — F322 Major depressive disorder, single episode, severe without psychotic features: Secondary | ICD-10-CM

## 2019-09-20 DIAGNOSIS — R002 Palpitations: Secondary | ICD-10-CM

## 2019-09-20 DIAGNOSIS — I498 Other specified cardiac arrhythmias: Secondary | ICD-10-CM

## 2019-09-20 DIAGNOSIS — I10 Essential (primary) hypertension: Secondary | ICD-10-CM

## 2019-09-20 NOTE — Patient Instructions (Signed)
Medication Instructions:  No medication changes *If you need a refill on your cardiac medications before your next appointment, please call your pharmacy*   Lab Work: None ordered If you have labs (blood work) drawn today and your tests are completely normal, you will receive your results only by: Marland Kitchen MyChart Message (if you have MyChart) OR . A paper copy in the mail If you have any lab test that is abnormal or we need to change your treatment, we will call you to review the results.   Testing/Procedures:  WHY IS MY DOCTOR PRESCRIBING ZIO? The Zio system is proven and trusted by physicians to detect and diagnose irregular heart rhythms -- and has been prescribed to hundreds of thousands of patients.  The FDA has cleared the Zio system to monitor for many different kinds of irregular heart rhythms. In a study, physicians were able to reach a diagnosis 90% of the time with the Zio system1.  You can wear the Zio monitor -- a small, discreet, comfortable patch -- during your normal day-to-day activity, including while you sleep, shower, and exercise, while it records every single heartbeat for analysis.  1Barrett, P., et al. Comparison of 24 Hour Holter Monitoring Versus 14 Day Novel Adhesive Patch Electrocardiographic Monitoring. H. Rivera Colon, 2014.  ZIO VS. HOLTER MONITORING The Zio monitor can be comfortably worn for up to 14 days. Holter monitors can be worn for 24 to 48 hours, limiting the time to record any irregular heart rhythms you may have. Zio is able to capture data for the 51% of patients who have their first symptom-triggered arrhythmia after 48 hours.1  LIVE WITHOUT RESTRICTIONS The Zio ambulatory cardiac monitor is a small, unobtrusive, and water-resistant patch--you might even forget you're wearing it. The Zio monitor records and stores every beat of your heart, whether you're sleeping, working out, or showering.  Wear the monitor for 2 weeks. Remove on  10/04/19 in the pm.   Follow-Up: At [facility], you and your health needs are our priority.  As part of our continuing mission to provide you with exceptional heart care, we have created designated Provider Care Teams.  These Care Teams include your primary Cardiologist (physician) and Advanced Practice Providers (APPs -  Physician Assistants and Nurse Practitioners) who all work together to provide you with the care you need, when you need it.  We recommend signing up for the patient portal called "MyChart".  Sign up information is provided on this After Visit Summary.  MyChart is used to connect with patients for Virtual Visits (Telemedicine).  Patients are able to view lab/test results, encounter notes, upcoming appointments, etc.  Non-urgent messages can be sent to your provider as well.   To learn more about what you can do with MyChart, go to [email or web address].    Your next appointment:   6 month(s)  The format for your next appointment:   In Person  Provider:   Jyl Heinz, MD   Other Instructions NA

## 2019-09-20 NOTE — Progress Notes (Signed)
Cardiology Office Note:    Date:  09/20/2019   ID:  Chase [name], DOB June 27, 1960, MRN [id number]  PCP:  Ann Held, DO  Cardiologist:  Jenean Lindau, MD   Referring MD: Carollee Herter, Alferd Apa, *    ASSESSMENT:    No diagnosis found. PLAN:    In order of problems listed above:  1. Primary prevention stressed with the patient.  Importance of compliance with diet medication stressed and he vocalized understanding. 2. Essential hypertension: Blood pressure is stable 3. Mixed dyslipidemia: Lipids are fine and managed by his primary care physician.  I discussed this with him at length. 4. Palpitations: Patient continues to have palpitations and a TSH was unremarkable.  Is you monitor was lost in the mail and we will repeat this. 5. Patient will be seen in follow-up appointment in 6 months or earlier if the patient has any concerns    Medication Adjustments/Labs and Tests Ordered: Current medicines are reviewed at length with the patient today.  Concerns regarding medicines are outlined above.  No orders of the defined types were placed in this encounter.  No orders of the defined types were placed in this encounter.    Chief Complaint  Patient presents with  . Follow-up    3 Months     History of Present Illness:    Chase [name] is a 59 y.o. male.  Patient has past medical history of palpitations.  He was evaluated by me and an echocardiogram and stress test was done and the patient did well on these tests.  He leads a fairly active lifestyle.  He is a poor historian.  No chest pain orthopnea or PND.  At the time of my evaluation, the patient is alert awake oriented and in no distress.  Past Medical History:  Diagnosis Date  . Allergy   . Arthritis   . Blood in stool   . Brain cyst   . Chest pain   . Constipation   . Diabetes mellitus without complication ([place])   . Difficulty urinating   . GERD (gastroesophageal reflux disease)   . Headache(784.0)   .  Hearing loss   . Hyperlipidemia   . Hypertension   . Leg swelling   . Nasal congestion   . PONV (postoperative nausea and vomiting)   . Rectal pain   . Rectal ulcer with bleeding after hemorrhoid banding 02/11/2015  . Sleep apnea   . Stroke ([place])   . Stroke ([place])   . Thrombosed hemorrhoids   . Trouble swallowing     Past Surgical History:  Procedure Laterality Date  . CATARACT EXTRACTION     x 3  . [place]   cataracts  . FLEXIBLE SIGMOIDOSCOPY N/A 02/11/2015   Procedure: FLEXIBLE SIGMOIDOSCOPY;  Surgeon: Gatha Mayer, MD;  Location: [facility] END[facility]COPY;  Service: Endoscopy;  Laterality: N/A;  . INNER EAR SURGERY     rt ear  . INNER EAR SURGERY  1992  . surgery - right arm     1983    Current Medications: Current Meds  Medication Sig  . allopurinol (ZYLOPRIM) 100 MG tablet Take 1 tablet (100 mg total) by mouth daily.  Marland Kitchen amLODipine (NORVASC) 5 MG tablet TAKE 1 TABLET BY MOUTH EVERY DAY  . Azelastine HCl 0.15 % SOLN Two sprays each nostril 1-2 times a day as needed.  Marland Kitchen BAYER MICROLET LANCETS lancets Use 1x a day  . betamethasone dipropionate (DIPROLENE) 0.05 %  ointment Apply topically 2 (two) times daily.  . budesonide (PULMICORT) 0.5 MG/2ML nebulizer solution Mix with 240 cc saline in the [employer] bottle as directed and rinse sinuses twice a day.  . clotrimazole-betamethasone (LOTRISONE) cream Apply 1 application topically 2 (two) times daily.  . Continuous Blood Gluc Receiver (FREESTYLE LIBRE 14 DAY READER) DEVI 1 Device by Does not apply route every 14 (fourteen) days.  . Continuous Blood Gluc Sensor (FREESTYLE LIBRE 14 DAY SENSOR) MISC 1 each by Subdermal route every 14 (fourteen) days.  . CONTOUR NEXT TEST test strip USE ONCE DAILY - E11.51, E11.65 DISPENSE CONTOUR NEXT STRIPS  . cyclobenzaprine (FLEXERIL) 10 MG tablet TAKE 1 TABLET BY MOUTH AT BEDTIME AS NEEDED FOR MUSCLE SPASMS  . cyproheptadine (PERIACTIN) 4 MG tablet TAKE 1 TABLET (4 MG TOTAL) BY MOUTH  AT BEDTIME.  . diphenhydrAMINE (BENADRYL) 25 MG tablet   . empagliflozin (JARDIANCE) 25 MG TABS tablet Take 25 mg by mouth daily.  Marland Kitchen EPINEPHrine 0.3 mg/0.3 mL IJ SOAJ injection Use as directed for severe allergic reaction.  . famotidine (PEPCID) 40 MG/5ML suspension TAKE 2.5 MLS (20 MG TOTAL) BY MOUTH DAILY.  Marland Kitchen FIBER SELECT GUMMIES PO Take 1 tablet by mouth daily.  . finasteride (PROSCAR) 5 MG tablet Take 5 mg by mouth daily.  Marland Kitchen gabapentin (NEURONTIN) 100 MG capsule TAKE 1 CAPSULE BY MOUTH THREE TIMES A DAY  . gentamicin cream (GARAMYCIN) 0.1 % Apply 1 application topically 3 (three) times daily.  . GuaiFENesin 200 MG/10ML SOLN   . HYDROcodone-acetaminophen (HYCET) 7.5-325 mg/15 ml solution TAKE 15 ML EVERY 6 HOURS AS NEEDED FOR PAIN  . hydrOXYzine (ATARAX/VISTARIL) 25 MG tablet TAKE 1 TABLET BY MOUTH EVERY 8 HOURS AS NEEDED FOR ANXIETY OR ITCHING  . Ibuprofen-Acetaminophen (ADVIL DUAL ACTION) 125-250 MG TABS Take 2 tablets by mouth daily.  . metoprolol tartrate (LOPRESSOR) 25 MG tablet 1 po bid  . mometasone (ELOCON) 0.1 % ointment APPLY TO AFFECTED AREA EVERY DAY AS NEEDED FOR RASH  . Multiple Vitamins-Minerals (AIRBORNE GUMMIES PO) Take 1 each by mouth daily.  . NONFORMULARY OR COMPOUNDED ITEM Compression socks  #1   15-20 mm/hg  Dx edema  . ondansetron (ZOFRAN-ODT) 4 MG disintegrating tablet USE UP TO THREE TIMES A DAY AS NEEDED FOR NAUSEA.  . polyethylene glycol powder (GLYCOLAX/MIRALAX) 17 GM/SCOOP powder TAKE 17 GRAMS BY MOUTH 2 TIMES DAILY AS NEEDED.  Marland Kitchen pravastatin (PRAVACHOL) 20 MG tablet TAKE 1 TABLET BY MOUTH EVERY DAY  . PROCTOFOAM HC rectal foam APPLY FOAM THREE TIMES A DAY AS NEEDED FOR 7 DAYS  . sitaGLIPtin (JANUVIA) 100 MG tablet Take 1 tablet (100 mg total) by mouth daily.  Marland Kitchen terazosin (HYTRIN) 5 MG capsule Take by mouth daily.  Marland Kitchen triamcinolone cream (KENALOG) 0.1 % Apply 1 application topically 2 (two) times daily.  Marland Kitchen venlafaxine XR (EFFEXOR-XR) 75 MG 24 hr capsule TAKE 1  CAPSULE (75 MG TOTAL) BY MOUTH DAILY WITH BREAKFAST.     Allergies:   Amoxicillin, Fluticasone, Sulfa antibiotics, Flonase [fluticasone propionate], and Terfenadine   Social History   Socioeconomic History  . Marital status: Single    Spouse name: Not on file  . Number of children: 0  . Years of education: Not on file  . Highest education level: Not on file  Occupational History  . Not on file  Tobacco Use  . Smoking status: Never Smoker  . Smokeless tobacco: Never Used  Substance and Sexual Activity  . Alcohol use: No  Alcohol/week: 0.0 standard drinks    Comment: none  . Drug use: No  . Sexual activity: Never  Other Topics Concern  . Not on file  Social History Narrative   No caffeine intake except for chocolate.  Exercised-walking   Social Determinants of Radio broadcast assistant Strain:   . Difficulty of Paying Living Expenses:   Food Insecurity:   . Worried About Charity fundraiser in the Last Year:   . Arboriculturist in the Last Year:   Transportation Needs:   . Film/video editor (Medical):   Marland Kitchen Lack of Transportation (Non-Medical):   Physical Activity:   . Days of Exercise per Week:   . Minutes of Exercise per Session:   Stress:   . Feeling of Stress :   Social Connections:   . Frequency of Communication with Friends and Family:   . Frequency of Social Gatherings with Friends and Family:   . Attends Religious Services:   . Active Member of Clubs or Organizations:   . Attends Archivist Meetings:   Marland Kitchen Marital Status:      Family History: The patient's family history includes Breast cancer (age of onset: 64) in his mother; Heart attack in his father; Heart disease in his father; Huntington's disease in his mother; Hypertension in his brother and father; Stroke in his father. There is no history of Hyperlipidemia or Diabetes.  ROS:   Please see the history of present illness.    All other systems reviewed and are  negative.  EKGs/Labs/Other Studies Reviewed:    The following studies were reviewed today: Study Highlights    The left ventricular ejection fraction is normal (55-65%).  Nuclear stress EF: 61%.  There was no ST segment deviation noted during stress.  The study is normal.  This is a low risk study.   Normal pharmacologic nuclear stress test with no evidence for prior infarct or ischemia. Normal LVEF.   IMPRESSIONS    1. Left ventricular ejection fraction, by visual estimation, is 60 to  65%. There is mildly increased left ventricular hypertrophy.  2. Left ventricular diastolic parameters are consistent with Grade I  diastolic dysfunction (impaired relaxation).  3. Left atrial size was normal.  4. Right atrial size was normal.  5. The mitral valve is normal in structure. Trivial mitral valve  regurgitation.  6. The tricuspid valve is normal in structure.  7. The aortic valve is normal in structure.    Recent Labs: 05/24/2019: ALT 13; BUN 9; Creatinine, Ser 0.93; Potassium 4.3; Sodium 142; TSH 2.44  Recent Lipid Panel    Component Value Date/Time   CHOL 168 05/24/2019 1546   TRIG 271.0 (H) 05/24/2019 1546   HDL 33.10 (L) 05/24/2019 1546   CHOLHDL 5 05/24/2019 1546   VLDL 54.2 (H) 05/24/2019 1546   LDLCALC 102 (H) 08/04/2017 1151   LDLDIRECT 87.0 05/24/2019 1546    Physical Exam:    VS:  BP 112/78   Pulse 90   Ht 5' 9.5" (1.765 m)   Wt 186 lb (84.4 kg)   SpO2 97%   BMI 27.07 kg/m     Wt Readings from Last 3 Encounters:  09/20/19 186 lb (84.4 kg)  09/01/19 189 lb (85.7 kg)  08/22/19 187 lb 9.6 oz (85.1 kg)     GEN: Patient is in no acute distress HEENT: Normal NECK: No JVD; No carotid bruits LYMPHATICS: No lymphadenopathy CARDIAC: Hear sounds regular, 2/6 systolic murmur at the apex.  RESPIRATORY:  Clear to auscultation without rales, wheezing or rhonchi  ABDOMEN: Soft, non-tender, non-distended MUSCULOSKELETAL:  No edema; No deformity   SKIN: Warm and dry NEUROLOGIC:  Alert and oriented x 3 PSYCHIATRIC:  Normal affect   Signed, Jenean Lindau, MD  09/20/2019 3:36 PM    [facility]

## 2019-09-22 NOTE — Progress Notes (Signed)
   HPI: 59 y.o. male presenting today for diabetic foot care. He reports some intermittent pain of the bilateral feet caused by possible neuropathy. He has been taking Gabapentin for treatment with some relief. He denies worsening factors. Patient is here for further evaluation and treatment.  Past Medical History:  Diagnosis Date  . Allergy   . Arthritis   . Blood in stool   . Brain cyst   . Chest pain   . Constipation   . Diabetes mellitus without complication ([place])   . Difficulty urinating   . GERD (gastroesophageal reflux disease)   . Headache(784.0)   . Hearing loss   . Hyperlipidemia   . Hypertension   . Leg swelling   . Nasal congestion   . PONV (postoperative nausea and vomiting)   . Rectal pain   . Rectal ulcer with bleeding after hemorrhoid banding 02/11/2015  . Sleep apnea   . Stroke ([place])   . Stroke ([place])   . Thrombosed hemorrhoids   . Trouble swallowing      Physical Exam: General: The patient is alert and oriented x3 in no acute distress.  Dermatology: Skin is warm, dry and supple bilateral lower extremities. Negative for open lesions or macerations.  Vascular: Palpable pedal pulses bilaterally. No edema or erythema noted. Capillary refill within normal limits.  Neurological: Epicritic and protective threshold hypersensitive bilaterally.   Musculoskeletal Exam: Range of motion within normal limits to all pedal and ankle joints bilateral. Muscle strength 5/5 in all groups bilateral.   Assessment: 1. 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 Patient evaluated.  2. Refill prescription for Gabapentin 100 mg TID provided to patient.  3. Recommended OTC topical anesthesia.  4. Continue wearing good shoe gear.  5. Return to clinic as needed.      Edrick Kins, DPM [facility]  Dr. Edrick Kins, DPM    [address],  [address]                  Office [phone]  Fax [phone]

## 2019-09-25 ENCOUNTER — Other Ambulatory Visit: Payer: Self-pay | Admitting: Family Medicine

## 2019-09-25 DIAGNOSIS — M25511 Pain in right shoulder: Secondary | ICD-10-CM

## 2019-09-26 ENCOUNTER — Encounter: Payer: Self-pay | Admitting: Family Medicine

## 2019-09-26 ENCOUNTER — Telehealth (INDEPENDENT_AMBULATORY_CARE_PROVIDER_SITE_OTHER): Payer: BC Managed Care – PPO | Admitting: Family Medicine

## 2019-09-26 DIAGNOSIS — J0141 Acute recurrent pansinusitis: Secondary | ICD-10-CM

## 2019-09-26 MED ORDER — AZITHROMYCIN 250 MG PO TABS: ORAL_TABLET | ORAL | 1 refills | Status: DC

## 2019-09-26 NOTE — Progress Notes (Signed)
Virtual Visit via Video Note  I connected with [name] on 09/26/19 at  3:40 PM EDT by a video enabled telemedicine application and verified that I am speaking with the correct person using two identifiers.  Location: Patient: home  Provider: office    I discussed the limitations of evaluation and management by telemedicine and the availability of in person appointments. The patient expressed understanding and agreed to proceed.  History of Present Illness: Pt is home c/o sinus infection --- head pressure,  X 2 weeks    They doxy helped some but it is back in full force    Observations/Objective: There were no vitals filed for this visit. Had to transition to tele visit  Pt is in NAD No fevers   Assessment and Plan: 1. Acute recurrent pansinusitis Cont' astelin and xyzal  abx orders  - azithromycin (ZITHROMAX Z-PAK) 250 MG tablet; As directed  Dispense: 6 each; Refill: 1   Follow Up Instructions:    I discussed the assessment and treatment plan with the patient. The patient was provided an opportunity to ask questions and all were answered. The patient agreed with the plan and demonstrated an understanding of the instructions.   The patient was advised to call back or seek an in-person evaluation if the symptoms worsen or if the condition fails to improve as anticipated.  I provided 30 minutes of non-face-to-face time during this encounter.   Ann Held, DO

## 2019-10-03 ENCOUNTER — Encounter: Payer: Self-pay | Admitting: Internal Medicine

## 2019-10-03 ENCOUNTER — Other Ambulatory Visit: Payer: Self-pay

## 2019-10-03 MED ORDER — JARDIANCE 25 MG PO TABS: 25.0000 mg | ORAL_TABLET | Freq: Every day | ORAL | 3 refills | Status: DC

## 2019-10-05 ENCOUNTER — Other Ambulatory Visit: Payer: Self-pay | Admitting: Pain Medicine

## 2019-10-05 ENCOUNTER — Other Ambulatory Visit: Payer: Self-pay

## 2019-10-05 ENCOUNTER — Ambulatory Visit
Admission: RE | Admit: 2019-10-05 | Discharge: 2019-10-05 | Disposition: A | Discharge status: Home / Self Care | Payer: BC Managed Care – PPO | Source: Ambulatory Visit | Attending: Pain Medicine | Admitting: Pain Medicine

## 2019-10-05 DIAGNOSIS — M47812 Spondylosis without myelopathy or radiculopathy, cervical region: Secondary | ICD-10-CM | POA: Diagnosis not present

## 2019-10-05 DIAGNOSIS — M545 Low back pain, unspecified: Secondary | ICD-10-CM

## 2019-10-05 DIAGNOSIS — M542 Cervicalgia: Secondary | ICD-10-CM

## 2019-10-05 DIAGNOSIS — G894 Chronic pain syndrome: Secondary | ICD-10-CM | POA: Diagnosis not present

## 2019-10-06 ENCOUNTER — Other Ambulatory Visit: Payer: Self-pay

## 2019-10-06 MED ORDER — JARDIANCE 25 MG PO TABS: 25.0000 mg | ORAL_TABLET | Freq: Every day | ORAL | 6 refills | Status: DC

## 2019-10-06 NOTE — Telephone Encounter (Signed)
Prior authorization for Chase [name] has been approved by patient's insurance.  Coverage is effective 10/06/2019 to 09/30/2020 30 tabs per 30 days  PA Approval/Reference Number [id number]  Approval letter has been sent to scanning.

## 2019-10-10 ENCOUNTER — Other Ambulatory Visit: Payer: Self-pay | Admitting: Family Medicine

## 2019-10-10 DIAGNOSIS — K649 Unspecified hemorrhoids: Secondary | ICD-10-CM

## 2019-10-17 DIAGNOSIS — M47812 Spondylosis without myelopathy or radiculopathy, cervical region: Secondary | ICD-10-CM | POA: Diagnosis not present

## 2019-10-19 DIAGNOSIS — R002 Palpitations: Secondary | ICD-10-CM | POA: Diagnosis not present

## 2019-10-21 NOTE — Addendum Note (Signed)
Addended by: Truddie Hidden on: 10/21/2019 02:29 PM   Modules accepted: Orders

## 2019-10-31 DIAGNOSIS — G894 Chronic pain syndrome: Secondary | ICD-10-CM | POA: Diagnosis not present

## 2019-10-31 DIAGNOSIS — M546 Pain in thoracic spine: Secondary | ICD-10-CM | POA: Diagnosis not present

## 2019-10-31 DIAGNOSIS — M47812 Spondylosis without myelopathy or radiculopathy, cervical region: Secondary | ICD-10-CM | POA: Diagnosis not present

## 2019-10-31 DIAGNOSIS — Z79891 Long term (current) use of opiate analgesic: Secondary | ICD-10-CM | POA: Diagnosis not present

## 2019-10-31 DIAGNOSIS — Z79899 Other long term (current) drug therapy: Secondary | ICD-10-CM | POA: Diagnosis not present

## 2019-10-31 DIAGNOSIS — M9903 Segmental and somatic dysfunction of lumbar region: Secondary | ICD-10-CM | POA: Diagnosis not present

## 2019-10-31 DIAGNOSIS — M542 Cervicalgia: Secondary | ICD-10-CM | POA: Diagnosis not present

## 2019-10-31 DIAGNOSIS — M722 Plantar fascial fibromatosis: Secondary | ICD-10-CM | POA: Diagnosis not present

## 2019-10-31 DIAGNOSIS — M545 Low back pain: Secondary | ICD-10-CM | POA: Diagnosis not present

## 2019-11-01 ENCOUNTER — Other Ambulatory Visit: Payer: Self-pay | Admitting: Podiatry

## 2019-11-03 DIAGNOSIS — M722 Plantar fascial fibromatosis: Secondary | ICD-10-CM | POA: Diagnosis not present

## 2019-11-03 DIAGNOSIS — M546 Pain in thoracic spine: Secondary | ICD-10-CM | POA: Diagnosis not present

## 2019-11-03 DIAGNOSIS — M9903 Segmental and somatic dysfunction of lumbar region: Secondary | ICD-10-CM | POA: Diagnosis not present

## 2019-11-03 DIAGNOSIS — M542 Cervicalgia: Secondary | ICD-10-CM | POA: Diagnosis not present

## 2019-11-08 DIAGNOSIS — M9903 Segmental and somatic dysfunction of lumbar region: Secondary | ICD-10-CM | POA: Diagnosis not present

## 2019-11-08 DIAGNOSIS — M546 Pain in thoracic spine: Secondary | ICD-10-CM | POA: Diagnosis not present

## 2019-11-08 DIAGNOSIS — M722 Plantar fascial fibromatosis: Secondary | ICD-10-CM | POA: Diagnosis not present

## 2019-11-08 DIAGNOSIS — M542 Cervicalgia: Secondary | ICD-10-CM | POA: Diagnosis not present

## 2019-11-17 ENCOUNTER — Other Ambulatory Visit: Payer: Self-pay | Admitting: Family Medicine

## 2019-11-17 ENCOUNTER — Other Ambulatory Visit: Payer: Self-pay | Admitting: Internal Medicine

## 2019-11-17 DIAGNOSIS — E785 Hyperlipidemia, unspecified: Secondary | ICD-10-CM

## 2019-11-17 DIAGNOSIS — I10 Essential (primary) hypertension: Secondary | ICD-10-CM

## 2019-11-22 ENCOUNTER — Ambulatory Visit (INDEPENDENT_AMBULATORY_CARE_PROVIDER_SITE_OTHER): Payer: BC Managed Care – PPO | Admitting: Family Medicine

## 2019-11-22 ENCOUNTER — Encounter: Payer: Self-pay | Admitting: Family Medicine

## 2019-11-22 ENCOUNTER — Other Ambulatory Visit: Payer: Self-pay

## 2019-11-22 VITALS — BP 90/60 | HR 90 | Temp 97.4°F | Resp 18 | Ht 69.6 in | Wt 183.8 lb

## 2019-11-22 DIAGNOSIS — M1A09X0 Idiopathic chronic gout, multiple sites, without tophus (tophi): Secondary | ICD-10-CM | POA: Diagnosis not present

## 2019-11-22 DIAGNOSIS — G894 Chronic pain syndrome: Secondary | ICD-10-CM | POA: Diagnosis not present

## 2019-11-22 DIAGNOSIS — W57XXXA Bitten or stung by nonvenomous insect and other nonvenomous arthropods, initial encounter: Secondary | ICD-10-CM

## 2019-11-22 DIAGNOSIS — E785 Hyperlipidemia, unspecified: Secondary | ICD-10-CM

## 2019-11-22 DIAGNOSIS — S30860A Insect bite (nonvenomous) of lower back and pelvis, initial encounter: Secondary | ICD-10-CM | POA: Diagnosis not present

## 2019-11-22 DIAGNOSIS — H9193 Unspecified hearing loss, bilateral: Secondary | ICD-10-CM

## 2019-11-22 LAB — LIPID PANEL
Cholesterol: 185 mg/dL (ref 0–200)
HDL: 34.8 mg/dL — ABNORMAL LOW (ref >39.00)
NonHDL: 149.79
Total CHOL/HDL Ratio: 5
Triglycerides: 230 mg/dL — ABNORMAL HIGH (ref 0.0–149.0)
VLDL: 46 mg/dL — ABNORMAL HIGH (ref 0.0–40.0)

## 2019-11-22 LAB — COMPREHENSIVE METABOLIC PANEL
ALT: 15 U/L (ref 0–53)
AST: 12 U/L (ref 0–37)
Albumin: 4.3 g/dL (ref 3.5–5.2)
Alkaline Phosphatase: 97 U/L (ref 39–117)
BUN: 8 mg/dL (ref 6–23)
CO2: 31 mEq/L (ref 19–32)
Calcium: 9.6 mg/dL (ref 8.4–10.5)
Chloride: 102 mEq/L (ref 96–112)
Creatinine, Ser: 1.09 mg/dL (ref 0.40–1.50)
GFR: 69.18 mL/min (ref >60.00)
Glucose, Bld: 280 mg/dL — ABNORMAL HIGH (ref 70–99)
Potassium: 4 mEq/L (ref 3.5–5.1)
Sodium: 139 mEq/L (ref 135–145)
Total Bilirubin: 0.5 mg/dL (ref 0.2–1.2)
Total Protein: 6.6 g/dL (ref 6.0–8.3)

## 2019-11-22 LAB — URIC ACID: Uric Acid, Serum: 3.6 mg/dL — ABNORMAL LOW (ref 4.0–7.8)

## 2019-11-22 LAB — LDL CHOLESTEROL, DIRECT: Direct LDL: 100 mg/dL

## 2019-11-22 MED ORDER — DOXYCYCLINE HYCLATE 100 MG PO TABS: 100.0000 mg | ORAL_TABLET | Freq: Two times a day (BID) | ORAL | 0 refills | Status: DC

## 2019-11-22 NOTE — Progress Notes (Signed)
Patient ID: Chase [name], male    DOB: Jan 08, 1961  Age: 59 y.o. MRN: [id number]    Subjective:  Subjective  HPI Chase [name] presents for f/u gout and c/o tick bite on back last week.  A friend took it off for him/  The area is still sore -- no fever no other rash  He is also c/o decreased hearing --- he has hearing aids but his hearing has gotten worse over the last year  Pt is in pain management now   Review of Systems  Constitutional: Negative for appetite change, diaphoresis, fatigue and unexpected weight change.  Eyes: Negative for pain, redness and visual disturbance.  Respiratory: Negative for cough, chest tightness, shortness of breath and wheezing.   Cardiovascular: Negative for chest pain, palpitations and leg swelling.  Endocrine: Negative for cold intolerance, heat intolerance, polydipsia, polyphagia and polyuria.  Genitourinary: Negative for difficulty urinating, dysuria and frequency.  Musculoskeletal: Positive for arthralgias and back pain. Negative for gait problem and myalgias.  Neurological: Negative for dizziness, light-headedness, numbness and headaches.    History Past Medical History:  Diagnosis Date  . Allergy   . Arthritis   . Blood in stool   . Brain cyst   . Chest pain   . Constipation   . Diabetes mellitus without complication ([place])   . Difficulty urinating   . GERD (gastroesophageal reflux disease)   . Headache(784.0)   . Hearing loss   . Hyperlipidemia   . Hypertension   . Leg swelling   . Nasal congestion   . PONV (postoperative nausea and vomiting)   . Rectal pain   . Rectal ulcer with bleeding after hemorrhoid banding 02/11/2015  . Sleep apnea   . Stroke ([place])   . Stroke ([place])   . Thrombosed hemorrhoids   . Trouble swallowing     He has a past surgical history that includes Cataract extraction; Inner ear surgery; Eye surgery ([place]); Inner ear surgery (1992); surgery - right arm; and Flexible sigmoidoscopy (N/A, 02/11/2015).    His family history includes Breast cancer (age of onset: 59) in his mother; Heart attack in his father; Heart disease in his father; Huntington's disease in his mother; Hypertension in his brother and father; Stroke in his father.He reports that he has never smoked. He has never used smokeless tobacco. He reports that he does not drink alcohol or use drugs.  Current Outpatient Medications on File Prior to Visit  Medication Sig Dispense Refill  . allopurinol (ZYLOPRIM) 100 MG tablet Take 1 tablet (100 mg total) by mouth daily. 90 tablet 1  . amLODipine (NORVASC) 5 MG tablet TAKE 1 TABLET BY MOUTH EVERY DAY 90 tablet 0  . Azelastine HCl 0.15 % SOLN Two sprays each nostril 1-2 times a day as needed. 30 mL 5  . BAYER MICROLET LANCETS lancets Use 1x a day 100 each 4  . betamethasone dipropionate (DIPROLENE) 0.05 % ointment Apply topically 2 (two) times daily. 30 g 0  . budesonide (PULMICORT) 0.5 MG/2ML nebulizer solution Mix with 240 cc saline in the [facility] bottle as directed and rinse sinuses twice a day. 120 mL 5  . clotrimazole-betamethasone (LOTRISONE) cream Apply 1 application topically 2 (two) times daily. 30 g 0  . Continuous Blood Gluc Receiver (FREESTYLE LIBRE 14 DAY READER) DEVI 1 Device by Does not apply route every 14 (fourteen) days. 1 Device 0  . Continuous Blood Gluc Sensor (FREESTYLE LIBRE 14 DAY SENSOR) MISC 1 each by Subdermal route  every 14 (fourteen) days. 6 each 2  . CONTOUR NEXT TEST test strip USE ONCE DAILY - E11.51, E11.65 DISPENSE CONTOUR NEXT STRIPS 100 strip 3  . cyclobenzaprine (FLEXERIL) 10 MG tablet TAKE 1 TABLET BY MOUTH AT BEDTIME AS NEEDED FOR MUSCLE SPASMS 30 tablet 1  . cyproheptadine (PERIACTIN) 4 MG tablet TAKE 1 TABLET (4 MG TOTAL) BY MOUTH AT BEDTIME. 90 tablet 1  . diphenhydrAMINE (BENADRYL) 25 MG tablet     . empagliflozin (JARDIANCE) 25 MG TABS tablet Take 25 mg by mouth daily. 30 tablet 6  . EPINEPHrine 0.3 mg/0.3 mL IJ SOAJ injection Use as directed for  severe allergic reaction. 2 each 3  . famotidine (PEPCID) 40 MG/5ML suspension TAKE 2.5 MLS (20 MG TOTAL) BY MOUTH DAILY. 100 mL 2  . FIBER SELECT GUMMIES PO Take 1 tablet by mouth daily.    . finasteride (PROSCAR) 5 MG tablet Take 5 mg by mouth daily.    Marland Kitchen gabapentin (NEURONTIN) 100 MG capsule TAKE 1 CAPSULE BY MOUTH THREE TIMES A DAY 90 capsule 3  . gentamicin cream (GARAMYCIN) 0.1 % Apply 1 application topically 3 (three) times daily. 30 g 1  . GuaiFENesin 200 MG/10ML SOLN     . HYDROcodone-acetaminophen (HYCET) 7.5-325 mg/15 ml solution TAKE 15 ML EVERY 6 HOURS AS NEEDED FOR PAIN 473 mL 0  . hydrOXYzine (ATARAX/VISTARIL) 25 MG tablet TAKE 1 TABLET BY MOUTH EVERY 8 HOURS AS NEEDED FOR ANXIETY OR ITCHING 270 tablet 1  . Ibuprofen-Acetaminophen (ADVIL DUAL ACTION) 125-250 MG TABS Take 2 tablets by mouth daily.    . metoprolol tartrate (LOPRESSOR) 25 MG tablet 1 po bid 180 tablet 1  . mometasone (ELOCON) 0.1 % ointment APPLY TO AFFECTED AREA EVERY DAY AS NEEDED FOR RASH 45 g 0  . Multiple Vitamins-Minerals (AIRBORNE GUMMIES PO) Take 1 each by mouth daily.    . NONFORMULARY OR COMPOUNDED ITEM Compression socks  #1   15-20 mm/hg  Dx edema 1 each 1  . ondansetron (ZOFRAN-ODT) 4 MG disintegrating tablet USE UP TO THREE TIMES A DAY AS NEEDED FOR NAUSEA. 10 tablet 0  . polyethylene glycol powder (GLYCOLAX/MIRALAX) 17 GM/SCOOP powder TAKE 17 GRAMS BY MOUTH 2 TIMES DAILY AS NEEDED. 527 g 5  . pravastatin (PRAVACHOL) 20 MG tablet TAKE 1 TABLET BY MOUTH EVERY DAY 90 tablet 0  . PROCTOFOAM HC rectal foam APPLY FOAM THREE TIMES A DAY AS NEEDED FOR 7 DAYS 10 g 2  . sitaGLIPtin (JANUVIA) 100 MG tablet Take 1 tablet (100 mg total) by mouth daily. 30 tablet 2  . terazosin (HYTRIN) 5 MG capsule Take by mouth daily.  3  . triamcinolone cream (KENALOG) 0.1 % Apply 1 application topically 2 (two) times daily. 30 g 0  . venlafaxine XR (EFFEXOR-XR) 75 MG 24 hr capsule TAKE 1 CAPSULE (75 MG TOTAL) BY MOUTH DAILY WITH  BREAKFAST. 90 capsule 1  . azithromycin (ZITHROMAX Z-PAK) 250 MG tablet As directed (Patient not taking: Reported on 11/22/2019) 6 each 1   No current facility-administered medications on file prior to visit.     Objective:  Objective  Physical Exam Vitals and nursing note reviewed.  Constitutional:      General: He is sleeping.     Appearance: He is well-developed.  HENT:     Head: Normocephalic and atraumatic.  Eyes:     Pupils: Pupils are equal, round, and reactive to light.  Neck:     Thyroid: No thyromegaly.  Cardiovascular:  Rate and Rhythm: Normal rate and regular rhythm.     Heart sounds: No murmur.  Pulmonary:     Effort: Pulmonary effort is normal. No respiratory distress.     Breath sounds: Normal breath sounds. No wheezing or rales.  Chest:     Chest wall: No tenderness.  Musculoskeletal:        General: No tenderness.     Cervical back: Normal range of motion and neck supple.  Skin:    General: Skin is warm and dry.     Findings: Lesion present.       Neurological:     Mental Status: He is oriented to person, place, and time.  Psychiatric:        Behavior: Behavior normal.        Thought Content: Thought content normal.        Judgment: Judgment normal.    BP 90/60 (BP Location: Right Arm, Patient Position: Sitting, Cuff Size: Normal)   Pulse 90   Temp (!) 97.4 F (36.3 C) (Temporal)   Resp 18   Ht 5' 9.6" (1.768 m)   Wt 183 lb 12.8 oz (83.4 kg)   SpO2 97%   BMI 26.68 kg/m  Wt Readings from Last 3 Encounters:  11/22/19 183 lb 12.8 oz (83.4 kg)  09/20/19 186 lb (84.4 kg)  09/01/19 189 lb (85.7 kg)     Lab Results  Component Value Date   WBC 9.5 12/10/2017   HGB 14.1 12/10/2017   HCT 41.9 12/10/2017   PLT 350.0 12/10/2017   GLUCOSE 280 (H) 11/22/2019   CHOL 185 11/22/2019   TRIG 230.0 (H) 11/22/2019   HDL 34.80 (L) 11/22/2019   LDLDIRECT 100.0 11/22/2019   LDLCALC 102 (H) 08/04/2017   ALT 15 11/22/2019   AST 12 11/22/2019   NA 139  11/22/2019   K 4.0 11/22/2019   CL 102 11/22/2019   CREATININE 1.09 11/22/2019   BUN 8 11/22/2019   CO2 31 11/22/2019   TSH 2.44 05/24/2019   PSA 1.01 10/03/2014   INR 1.11 02/10/2015   HGBA1C 8.3 (H) 05/24/2019   MICROALBUR 0.7 08/04/2017    DG Cervical Spine 2 or 3 views  Result Date: 10/06/2019 CLINICAL DATA:  Chronic neck pain.  No recent injury. EXAM: CERVICAL SPINE - 2-3 VIEW COMPARISON:  Plain film cervical spine 03/25/2018. FINDINGS: Vertebral body height and alignment are normal. There is loss of disc space height and endplate spurring at R1-5 and C5-6. The facet joints appear normal. Prevertebral soft tissues are normal. Lung apices are clear. IMPRESSION: No change in the appearance of degenerative disc disease C5-6 and C6-7. Electronically Signed   By: Inge Rise M.D.   On: 10/06/2019 08:30   DG Lumbar Spine Complete  Result Date: 10/06/2019 CLINICAL DATA:  Chronic low back pain.  No recent injury. EXAM: LUMBAR SPINE - COMPLETE 4+ VIEW COMPARISON:  MRI lumbar spine 10/25/2005. FINDINGS: There is no evidence of lumbar spine fracture. Alignment is normal. Intervertebral disc spaces are maintained. IMPRESSION: Normal exam. Electronically Signed   By: Inge Rise M.D.   On: 10/06/2019 08:28     Assessment & Plan:  Plan  I am having Chase [name] start on doxycycline. I am also having him maintain his [place] PO, ondansetron, diphenhydrAMINE, guaiFENesin, mometasone, gentamicin cream, Multiple Vitamins-Minerals (AIRBORNE GUMMIES PO), Bayer Microlet Lancets, terazosin, NONFORMULARY OR COMPOUNDED ITEM, betamethasone dipropionate, FreeStyle Libre 14 Day Reader, FreeStyle Libre 14 Day Sensor, triamcinolone cream, metoprolol tartrate, sitaGLIPtin,  finasteride, Azelastine HCl, budesonide, EPINEPHrine, clotrimazole-betamethasone, hydrOXYzine, famotidine, Advil Dual Action, allopurinol, polyethylene glycol powder, HYDROcodone-acetaminophen, cyproheptadine, venlafaxine  XR, cyclobenzaprine, azithromycin, Jardiance, Proctofoam HC, gabapentin, Contour Next Test, pravastatin, and amLODipine.  Meds ordered this encounter  Medications  . doxycycline (VIBRA-TABS) 100 MG tablet    Sig: Take 1 tablet (100 mg total) by mouth 2 (two) times daily.    Dispense:  20 tablet    Refill:  0    Problem List Items Addressed This Visit      Unprioritized   Bilateral hearing loss    Refer to audiologist       Relevant Orders   Ambulatory referral to Audiology   Chronic pain syndrome    Per pain management       Hyperlipidemia    Tolerating statin, encouraged heart healthy diet, avoid trans fats, minimize simple carbs and saturated fats. Increase exercise as tolerated      Relevant Orders   Lipid panel (Completed)   Comprehensive metabolic panel (Completed)   Idiopathic chronic gout of multiple sites without tophus - Primary   Relevant Orders   Uric acid (Completed)   Tick bite of back    abx per orders Check labs       Relevant Medications   doxycycline (VIBRA-TABS) 100 MG tablet   Other Relevant Orders   [place] mtn spotted fvr abs pnl(IgG+IgM)   B. burgdorfi antibodies (Completed)      Follow-up: Return in about 6 months (around 05/23/2020), or if symptoms worsen or fail to improve, for hypertension, hyperlipidemia.  Ann Held, DO

## 2019-11-22 NOTE — Patient Instructions (Signed)
Gout  Gout is a condition that causes painful swelling of the joints. Gout is a type of inflammation of the joints (arthritis). This condition is caused by having too much uric acid in the body. Uric acid is a chemical that forms when the body breaks down substances called purines. Purines are important for building body proteins. When the body has too much uric acid, sharp crystals can form and build up inside the joints. This causes pain and swelling. Gout attacks can happen quickly and may be very painful (acute gout). Over time, the attacks can affect more joints and become more frequent (chronic gout). Gout can also cause uric acid to build up under the skin and inside the kidneys. What are the causes? This condition is caused by too much uric acid in your blood. This can happen because:  Your kidneys do not remove enough uric acid from your blood. This is the most common cause.  Your body makes too much uric acid. This can happen with some cancers and cancer treatments. It can also occur if your body is breaking down too many red blood cells (hemolytic anemia).  You eat too many foods that are high in purines. These foods include organ meats and some seafood. Alcohol, especially beer, is also high in purines. A gout attack may be triggered by trauma or stress. What increases the risk? You are more likely to develop this condition if you:  Have a family history of gout.  Are male and middle-aged.  Are male and have gone through menopause.  Are obese.  Frequently drink alcohol, especially beer.  Are dehydrated.  Lose weight too quickly.  Have an organ transplant.  Have lead poisoning.  Take certain medicines, including aspirin, cyclosporine, diuretics, levodopa, and niacin.  Have kidney disease.  Have a skin condition called psoriasis. What are the signs or symptoms? An attack of acute gout happens quickly. It usually occurs in just one joint. The most common place is  the big toe. Attacks often start at night. Other joints that may be affected include joints of the feet, ankle, knee, fingers, wrist, or elbow. Symptoms of this condition may include:  Severe pain.  Warmth.  Swelling.  Stiffness.  Tenderness. The affected joint may be very painful to touch.  Shiny, red, or purple skin.  Chills and fever. Chronic gout may cause symptoms more frequently. More joints may be involved. You may also have white or yellow lumps (tophi) on your hands or feet or in other areas near your joints. How is this diagnosed? This condition is diagnosed based on your symptoms, medical history, and physical exam. You may have tests, such as:  Blood tests to measure uric acid levels.  Removal of joint fluid with a thin needle (aspiration) to look for uric acid crystals.  X-rays to look for joint damage. How is this treated? Treatment for this condition has two phases: treating an acute attack and preventing future attacks. Acute gout treatment may include medicines to reduce pain and swelling, including:  NSAIDs.  Steroids. These are strong anti-inflammatory medicines that can be taken by mouth (orally) or injected into a joint.  Colchicine. This medicine relieves pain and swelling when it is taken soon after an attack. It can be given by mouth or through an IV. Preventive treatment may include:  Daily use of smaller doses of NSAIDs or colchicine.  Use of a medicine that reduces uric acid levels in your blood.  Changes to your diet. You may   need to see a dietitian about what to eat and drink to prevent gout. Follow these instructions at home: During a gout attack   If directed, put ice on the affected area: ? Put ice in a plastic bag. ? Place a towel between your skin and the bag. ? Leave the ice on for 20 minutes, 2-3 times a day.  Raise (elevate) the affected joint above the level of your heart as often as possible.  Rest the joint as much as possible.  If the affected joint is in your leg, you may be given crutches to use.  Follow instructions from your health care provider about eating or drinking restrictions. Avoiding future gout attacks  Follow a low-purine diet as told by your dietitian or health care provider. Avoid foods and drinks that are high in purines, including liver, kidney, anchovies, asparagus, herring, mushrooms, mussels, and beer.  Maintain a healthy weight or lose weight if you are overweight. If you want to lose weight, talk with your health care provider. It is important that you do not lose weight too quickly.  Start or maintain an exercise program as told by your health care provider. Eating and drinking  Drink enough fluids to keep your urine pale yellow.  If you drink alcohol: ? Limit how much you use to:  0-1 drink a day for women.  0-2 drinks a day for men. ? Be aware of how much alcohol is in your drink. In the [place] one drink equals one 12 oz bottle of beer (355 mL) one 5 oz glass of wine (148 mL), or one 1 oz glass of hard liquor (44 mL). General instructions  Take over-the-counter and prescription medicines only as told by your health care provider.  Do not drive or use heavy machinery while taking prescription pain medicine.  Return to your normal activities as told by your health care provider. Ask your health care provider what activities are safe for you.  Keep all follow-up visits as told by your health care provider. This is important. Contact a health care provider if you have:  Another gout attack.  Continuing symptoms of a gout attack after 10 days of treatment.  Side effects from your medicines.  Chills or a fever.  Burning pain when you urinate.  Pain in your lower back or belly. Get help right away if you:  Have severe or uncontrolled pain.  Cannot urinate. Summary  Gout is painful swelling of the joints caused by inflammation.  The most common site of pain is the big  toe, but it can affect other joints in the body.  Medicines and dietary changes can help to prevent and treat gout attacks. This information is not intended to replace advice given to you by your health care provider. Make sure you discuss any questions you have with your health care provider. Document Revised: 12/23/2017 Document Reviewed: 12/23/2017 Elsevier Patient Education  2020 [employer]  

## 2019-11-23 DIAGNOSIS — G894 Chronic pain syndrome: Secondary | ICD-10-CM

## 2019-11-23 DIAGNOSIS — M1A09X0 Idiopathic chronic gout, multiple sites, without tophus (tophi): Secondary | ICD-10-CM

## 2019-11-23 DIAGNOSIS — H9193 Unspecified hearing loss, bilateral: Secondary | ICD-10-CM | POA: Insufficient documentation

## 2019-11-23 DIAGNOSIS — S30860A Insect bite (nonvenomous) of lower back and pelvis, initial encounter: Secondary | ICD-10-CM

## 2019-11-23 DIAGNOSIS — E785 Hyperlipidemia, unspecified: Secondary | ICD-10-CM | POA: Insufficient documentation

## 2019-11-23 DIAGNOSIS — W57XXXA Bitten or stung by nonvenomous insect and other nonvenomous arthropods, initial encounter: Secondary | ICD-10-CM

## 2019-11-23 HISTORY — DX: Chronic pain syndrome: G89.4

## 2019-11-23 HISTORY — DX: Unspecified hearing loss, bilateral: H91.93

## 2019-11-23 HISTORY — DX: Idiopathic chronic gout, multiple sites, without tophus (tophi): M1A.09X0

## 2019-11-23 HISTORY — DX: Bitten or stung by nonvenomous insect and other nonvenomous arthropods, initial encounter: W57.XXXA

## 2019-11-23 HISTORY — DX: Bitten or stung by nonvenomous insect and other nonvenomous arthropods, initial encounter: S30.860A

## 2019-11-23 LAB — ROCKY MTN SPOTTED FVR ABS PNL(IGG+IGM)
RMSF IgG: NOT DETECTED
RMSF IgM: NOT DETECTED

## 2019-11-23 LAB — B. BURGDORFI ANTIBODIES: B burgdorferi Ab IgG+IgM: <0.9 index

## 2019-11-23 NOTE — Assessment & Plan Note (Signed)
abx per orders  Check labs  

## 2019-11-23 NOTE — Assessment & Plan Note (Signed)
Tolerating statin, encouraged heart healthy diet, avoid trans fats, minimize simple carbs and saturated fats. Increase exercise as tolerated 

## 2019-11-23 NOTE — Assessment & Plan Note (Signed)
Per pain management.  

## 2019-11-23 NOTE — Assessment & Plan Note (Signed)
Refer to audiologist

## 2019-11-27 ENCOUNTER — Other Ambulatory Visit: Payer: Self-pay | Admitting: Family Medicine

## 2019-11-27 ENCOUNTER — Other Ambulatory Visit: Payer: Self-pay | Admitting: Internal Medicine

## 2019-11-27 DIAGNOSIS — E1165 Type 2 diabetes mellitus with hyperglycemia: Secondary | ICD-10-CM

## 2019-11-27 DIAGNOSIS — IMO0002 Reserved for concepts with insufficient information to code with codable children: Secondary | ICD-10-CM

## 2019-11-27 DIAGNOSIS — E1151 Type 2 diabetes mellitus with diabetic peripheral angiopathy without gangrene: Secondary | ICD-10-CM

## 2019-11-27 DIAGNOSIS — M25511 Pain in right shoulder: Secondary | ICD-10-CM

## 2019-11-28 DIAGNOSIS — M545 Low back pain: Secondary | ICD-10-CM | POA: Diagnosis not present

## 2019-11-28 DIAGNOSIS — G894 Chronic pain syndrome: Secondary | ICD-10-CM | POA: Diagnosis not present

## 2019-11-28 DIAGNOSIS — M542 Cervicalgia: Secondary | ICD-10-CM | POA: Diagnosis not present

## 2019-11-28 DIAGNOSIS — M47812 Spondylosis without myelopathy or radiculopathy, cervical region: Secondary | ICD-10-CM | POA: Diagnosis not present

## 2019-12-06 ENCOUNTER — Other Ambulatory Visit: Payer: Self-pay | Admitting: Pain Medicine

## 2019-12-06 DIAGNOSIS — M25519 Pain in unspecified shoulder: Secondary | ICD-10-CM

## 2019-12-06 DIAGNOSIS — M545 Low back pain, unspecified: Secondary | ICD-10-CM

## 2019-12-06 DIAGNOSIS — M549 Dorsalgia, unspecified: Secondary | ICD-10-CM

## 2019-12-06 DIAGNOSIS — G8929 Other chronic pain: Secondary | ICD-10-CM

## 2019-12-06 DIAGNOSIS — M542 Cervicalgia: Secondary | ICD-10-CM

## 2019-12-11 ENCOUNTER — Other Ambulatory Visit: Payer: Self-pay | Admitting: Family Medicine

## 2019-12-21 DIAGNOSIS — F4542 Pain disorder with related psychological factors: Secondary | ICD-10-CM | POA: Diagnosis not present

## 2019-12-21 DIAGNOSIS — G894 Chronic pain syndrome: Secondary | ICD-10-CM | POA: Diagnosis not present

## 2019-12-21 DIAGNOSIS — M792 Neuralgia and neuritis, unspecified: Secondary | ICD-10-CM | POA: Diagnosis not present

## 2019-12-21 DIAGNOSIS — G609 Hereditary and idiopathic neuropathy, unspecified: Secondary | ICD-10-CM | POA: Diagnosis not present

## 2019-12-27 ENCOUNTER — Other Ambulatory Visit: Payer: Self-pay | Admitting: Family Medicine

## 2019-12-27 DIAGNOSIS — K219 Gastro-esophageal reflux disease without esophagitis: Secondary | ICD-10-CM

## 2019-12-27 DIAGNOSIS — S30860A Insect bite (nonvenomous) of lower back and pelvis, initial encounter: Secondary | ICD-10-CM

## 2019-12-27 DIAGNOSIS — W57XXXA Bitten or stung by nonvenomous insect and other nonvenomous arthropods, initial encounter: Secondary | ICD-10-CM

## 2019-12-30 ENCOUNTER — Other Ambulatory Visit: Payer: Self-pay | Admitting: Family Medicine

## 2019-12-30 DIAGNOSIS — I10 Essential (primary) hypertension: Secondary | ICD-10-CM

## 2020-01-03 ENCOUNTER — Encounter: Payer: Self-pay | Admitting: Family Medicine

## 2020-01-04 ENCOUNTER — Other Ambulatory Visit: Payer: Self-pay | Admitting: Family Medicine

## 2020-01-04 DIAGNOSIS — J329 Chronic sinusitis, unspecified: Secondary | ICD-10-CM

## 2020-01-04 NOTE — Telephone Encounter (Signed)
Okay to place referral

## 2020-01-04 NOTE — Telephone Encounter (Signed)
Referral placed.

## 2020-01-07 ENCOUNTER — Other Ambulatory Visit: Payer: Self-pay

## 2020-01-07 ENCOUNTER — Ambulatory Visit
Admission: RE | Admit: 2020-01-07 | Discharge: 2020-01-07 | Disposition: A | Discharge status: Home / Self Care | Payer: BC Managed Care – PPO | Source: Ambulatory Visit | Attending: Pain Medicine | Admitting: Pain Medicine

## 2020-01-07 DIAGNOSIS — G8929 Other chronic pain: Secondary | ICD-10-CM

## 2020-01-07 DIAGNOSIS — M545 Low back pain, unspecified: Secondary | ICD-10-CM

## 2020-01-07 DIAGNOSIS — M47814 Spondylosis without myelopathy or radiculopathy, thoracic region: Secondary | ICD-10-CM | POA: Diagnosis not present

## 2020-01-07 DIAGNOSIS — M25519 Pain in unspecified shoulder: Secondary | ICD-10-CM

## 2020-01-07 DIAGNOSIS — N3289 Other specified disorders of bladder: Secondary | ICD-10-CM | POA: Diagnosis not present

## 2020-01-07 DIAGNOSIS — M549 Dorsalgia, unspecified: Secondary | ICD-10-CM

## 2020-01-07 DIAGNOSIS — M5124 Other intervertebral disc displacement, thoracic region: Secondary | ICD-10-CM | POA: Diagnosis not present

## 2020-01-07 DIAGNOSIS — M4802 Spinal stenosis, cervical region: Secondary | ICD-10-CM | POA: Diagnosis not present

## 2020-01-08 ENCOUNTER — Other Ambulatory Visit: Payer: Self-pay | Admitting: Allergy & Immunology

## 2020-01-09 DIAGNOSIS — G9589 Other specified diseases of spinal cord: Secondary | ICD-10-CM | POA: Diagnosis not present

## 2020-01-09 DIAGNOSIS — M5134 Other intervertebral disc degeneration, thoracic region: Secondary | ICD-10-CM | POA: Diagnosis not present

## 2020-01-09 DIAGNOSIS — G894 Chronic pain syndrome: Secondary | ICD-10-CM | POA: Diagnosis not present

## 2020-01-09 DIAGNOSIS — M5030 Other cervical disc degeneration, unspecified cervical region: Secondary | ICD-10-CM | POA: Diagnosis not present

## 2020-01-23 ENCOUNTER — Encounter: Payer: Self-pay | Admitting: Family Medicine

## 2020-01-23 DIAGNOSIS — W57XXXA Bitten or stung by nonvenomous insect and other nonvenomous arthropods, initial encounter: Secondary | ICD-10-CM

## 2020-01-23 DIAGNOSIS — S30860A Insect bite (nonvenomous) of lower back and pelvis, initial encounter: Secondary | ICD-10-CM

## 2020-01-23 NOTE — Telephone Encounter (Signed)
Please advise 

## 2020-01-23 NOTE — Telephone Encounter (Signed)
Ok to send in doxy 100 mg 1 po bid x 10 days

## 2020-01-24 MED ORDER — DOXYCYCLINE HYCLATE 100 MG PO CAPS: 100.0000 mg | ORAL_CAPSULE | Freq: Two times a day (BID) | ORAL | 0 refills | Status: DC

## 2020-01-25 DIAGNOSIS — M5030 Other cervical disc degeneration, unspecified cervical region: Secondary | ICD-10-CM | POA: Diagnosis not present

## 2020-01-25 DIAGNOSIS — M47812 Spondylosis without myelopathy or radiculopathy, cervical region: Secondary | ICD-10-CM | POA: Diagnosis not present

## 2020-01-25 DIAGNOSIS — G894 Chronic pain syndrome: Secondary | ICD-10-CM | POA: Diagnosis not present

## 2020-01-25 DIAGNOSIS — Z79891 Long term (current) use of opiate analgesic: Secondary | ICD-10-CM | POA: Diagnosis not present

## 2020-01-25 DIAGNOSIS — Z79899 Other long term (current) drug therapy: Secondary | ICD-10-CM | POA: Diagnosis not present

## 2020-01-25 MED ORDER — DOXYCYCLINE HYCLATE 100 MG PO TABS: 100.0000 mg | ORAL_TABLET | Freq: Two times a day (BID) | ORAL | 0 refills | Status: DC

## 2020-01-25 NOTE — Addendum Note (Signed)
Addended by: Sanda Linger on: 01/25/2020 10:30 AM   Modules accepted: Orders

## 2020-02-02 ENCOUNTER — Other Ambulatory Visit: Payer: Self-pay | Admitting: Family Medicine

## 2020-02-02 DIAGNOSIS — M1A09X0 Idiopathic chronic gout, multiple sites, without tophus (tophi): Secondary | ICD-10-CM

## 2020-02-02 DIAGNOSIS — M25511 Pain in right shoulder: Secondary | ICD-10-CM

## 2020-02-09 ENCOUNTER — Other Ambulatory Visit: Payer: Self-pay | Admitting: Family Medicine

## 2020-02-09 ENCOUNTER — Ambulatory Visit (INDEPENDENT_AMBULATORY_CARE_PROVIDER_SITE_OTHER): Payer: BC Managed Care – PPO | Admitting: Otolaryngology

## 2020-02-09 DIAGNOSIS — E785 Hyperlipidemia, unspecified: Secondary | ICD-10-CM

## 2020-02-11 ENCOUNTER — Other Ambulatory Visit: Payer: Self-pay | Admitting: Family Medicine

## 2020-02-11 DIAGNOSIS — K649 Unspecified hemorrhoids: Secondary | ICD-10-CM

## 2020-02-27 ENCOUNTER — Other Ambulatory Visit: Payer: Self-pay | Admitting: Internal Medicine

## 2020-02-27 DIAGNOSIS — E1165 Type 2 diabetes mellitus with hyperglycemia: Secondary | ICD-10-CM

## 2020-02-27 DIAGNOSIS — M5134 Other intervertebral disc degeneration, thoracic region: Secondary | ICD-10-CM | POA: Diagnosis not present

## 2020-02-27 DIAGNOSIS — IMO0002 Reserved for concepts with insufficient information to code with codable children: Secondary | ICD-10-CM

## 2020-02-27 DIAGNOSIS — E1151 Type 2 diabetes mellitus with diabetic peripheral angiopathy without gangrene: Secondary | ICD-10-CM

## 2020-03-04 ENCOUNTER — Other Ambulatory Visit: Payer: Self-pay | Admitting: Allergy & Immunology

## 2020-03-06 ENCOUNTER — Telehealth: Payer: Self-pay

## 2020-03-06 NOTE — Telephone Encounter (Signed)
PA initiated via Covermymeds; KEY: [id number]. Awaiting determination.

## 2020-03-09 NOTE — Telephone Encounter (Signed)
PA denied. Awaiting denial information.  °

## 2020-03-14 ENCOUNTER — Other Ambulatory Visit: Payer: Self-pay | Admitting: Family Medicine

## 2020-03-14 ENCOUNTER — Other Ambulatory Visit: Payer: Self-pay | Admitting: Podiatry

## 2020-03-14 DIAGNOSIS — F322 Major depressive disorder, single episode, severe without psychotic features: Secondary | ICD-10-CM

## 2020-03-14 DIAGNOSIS — W57XXXA Bitten or stung by nonvenomous insect and other nonvenomous arthropods, initial encounter: Secondary | ICD-10-CM

## 2020-03-14 DIAGNOSIS — S30860A Insect bite (nonvenomous) of lower back and pelvis, initial encounter: Secondary | ICD-10-CM

## 2020-03-14 NOTE — Telephone Encounter (Signed)
Please advise 

## 2020-03-15 DIAGNOSIS — E348 Other specified endocrine disorders: Secondary | ICD-10-CM | POA: Diagnosis not present

## 2020-03-15 DIAGNOSIS — M542 Cervicalgia: Secondary | ICD-10-CM

## 2020-03-15 DIAGNOSIS — D497 Neoplasm of unspecified behavior of endocrine glands and other parts of nervous system: Secondary | ICD-10-CM

## 2020-03-15 DIAGNOSIS — Z6826 Body mass index (BMI) 26.0-26.9, adult: Secondary | ICD-10-CM

## 2020-03-15 DIAGNOSIS — M5416 Radiculopathy, lumbar region: Secondary | ICD-10-CM

## 2020-03-15 DIAGNOSIS — M545 Low back pain: Secondary | ICD-10-CM | POA: Diagnosis not present

## 2020-03-15 HISTORY — DX: Radiculopathy, lumbar region: M54.16

## 2020-03-15 HISTORY — DX: Body mass index (BMI) 26.0-26.9, adult: Z68.26

## 2020-03-15 HISTORY — DX: Cervicalgia: M54.2

## 2020-03-15 HISTORY — DX: Neoplasm of unspecified behavior of endocrine glands and other parts of nervous system: D49.7

## 2020-03-23 DIAGNOSIS — G894 Chronic pain syndrome: Secondary | ICD-10-CM | POA: Diagnosis not present

## 2020-03-23 DIAGNOSIS — M545 Low back pain, unspecified: Secondary | ICD-10-CM | POA: Diagnosis not present

## 2020-03-23 DIAGNOSIS — M5134 Other intervertebral disc degeneration, thoracic region: Secondary | ICD-10-CM | POA: Diagnosis not present

## 2020-03-23 DIAGNOSIS — G9589 Other specified diseases of spinal cord: Secondary | ICD-10-CM | POA: Diagnosis not present

## 2020-03-31 ENCOUNTER — Other Ambulatory Visit: Payer: Self-pay | Admitting: Family Medicine

## 2020-03-31 DIAGNOSIS — I10 Essential (primary) hypertension: Secondary | ICD-10-CM

## 2020-04-04 ENCOUNTER — Other Ambulatory Visit: Payer: Self-pay | Admitting: Family Medicine

## 2020-04-04 DIAGNOSIS — M25511 Pain in right shoulder: Secondary | ICD-10-CM

## 2020-04-05 ENCOUNTER — Ambulatory Visit (INDEPENDENT_AMBULATORY_CARE_PROVIDER_SITE_OTHER): Payer: BC Managed Care – PPO | Admitting: Medical

## 2020-04-05 ENCOUNTER — Other Ambulatory Visit: Payer: Self-pay

## 2020-04-05 VITALS — BP 118/65 | HR 85 | Temp 98.1°F | Resp 18 | Ht 69.0 in | Wt 184.0 lb

## 2020-04-05 DIAGNOSIS — E1165 Type 2 diabetes mellitus with hyperglycemia: Secondary | ICD-10-CM

## 2020-04-05 DIAGNOSIS — H938X3 Other specified disorders of ear, bilateral: Secondary | ICD-10-CM

## 2020-04-05 DIAGNOSIS — R21 Rash and other nonspecific skin eruption: Secondary | ICD-10-CM | POA: Diagnosis not present

## 2020-04-05 DIAGNOSIS — J302 Other seasonal allergic rhinitis: Secondary | ICD-10-CM

## 2020-04-05 DIAGNOSIS — R197 Diarrhea, unspecified: Secondary | ICD-10-CM | POA: Diagnosis not present

## 2020-04-05 DIAGNOSIS — J3089 Other allergic rhinitis: Secondary | ICD-10-CM | POA: Diagnosis not present

## 2020-04-05 NOTE — Patient Instructions (Signed)
You have some recent ear pressure with mild nasal congestion and some sneezing.  I think this represents eustachian tube dysfunction and allergic rhinitis.  Will prescribe Astelin nasal spray since he cannot take Flonase.  If ear pain occurs or increasing pressure please let us know.  Recent rash as diffuse and started 2 days after finishing clindamycin.  Considering possible allergic reaction.  No shortness of breath reported.  No lip or tongue swelling.  Would recommend Xyzal antihistamine at night for itching and will prescribe topical triamcinolone to use on the lower back area.  Hopefully your rash will taper off.  If not then have to consider tapered course of prednisone.  However you are diabetic and your last A1c was high so would have to do that with caution.  If we have to give oral prednisone then would have to provide mealtime sliding scale insulin.  For diabetes continue Januvia and Jardiance.  Will get CMP and A1c today.  You have diarrhea recently for couple days after taking clindamycin.  Please pick up stool kit in the lab and if you have loose stools that persist go ahead and turn in stool kit today or tomorrow morning at the latest.  If diarrhea worsens let us know.  After turning in stool kit you could use Imodium for relief.  Also make sure that you stay well-hydrated and eat bland foods.  Follow-up in 7 to 10 days or as needed.

## 2020-04-05 NOTE — Progress Notes (Signed)
Subjective:    Patient ID: Chase [name], male    DOB: [birth year], 59 y.o.   MRN: [id number]  HPI Pt in for evaluation.  Pt has hearing aids. He states he can't hear well baseline and recently worse.  Pt states ear pressure, popping and crackling for over a week. Pt has been nasal congestion and some sneezing. No fever, no chills or sweating.   Symptoms for 7 days.   Pt had covid vaccine. Done in April.  Pt has some recent diarrhea since yesterday on and off.   Pt states also rash is everywhere. Torso, arms, legs and lower back. He is only itching on lower back. No other area.  Pt just finished clindamycin 2 days ago. Took clindamycin for 7 days.  Pt is diabetic. 10 months ago a1c was 8.3.  Pt is on [place].     Review of Systems  Constitutional: Negative for chills, fatigue and fever.  Respiratory: Negative for cough, chest tightness, shortness of breath and wheezing.   Cardiovascular: Negative for chest pain and palpitations.  Gastrointestinal: Positive for diarrhea. Negative for abdominal pain, nausea and vomiting.  Musculoskeletal: Negative for back pain and neck pain.  Skin: Negative for rash.  Hematological: Negative for adenopathy. Does not bruise/bleed easily.  Psychiatric/Behavioral: Negative for behavioral problems and confusion.    Past Medical History:  Diagnosis Date  . Allergy   . Arthritis   . Blood in stool   . Brain cyst   . Chest pain   . Constipation   . Diabetes mellitus without complication ([place])   . Difficulty urinating   . GERD (gastroesophageal reflux disease)   . Headache(784.0)   . Hearing loss   . Hyperlipidemia   . Hypertension   . Leg swelling   . Nasal congestion   . PONV (postoperative nausea and vomiting)   . Rectal pain   . Rectal ulcer with bleeding after hemorrhoid banding 02/11/2015  . Sleep apnea   . Stroke ([place])   . Stroke ([place])   . Thrombosed hemorrhoids   . Trouble swallowing      Social History    Socioeconomic History  . Marital status: Single    Spouse name: Not on file  . Number of children: 0  . Years of education: Not on file  . Highest education level: Not on file  Occupational History  . Not on file  Tobacco Use  . Smoking status: Never Smoker  . Smokeless tobacco: Never Used  Vaping Use  . Vaping Use: Never used  Substance and Sexual Activity  . Alcohol use: No    Alcohol/week: 0.0 standard drinks    Comment: none  . Drug use: No  . Sexual activity: Never  Other Topics Concern  . Not on file  Social History Narrative   No caffeine intake except for chocolate.  Exercised-walking   Social Determinants of Health   Financial Resource Strain:   . Difficulty of Paying Living Expenses: Not on file  Food Insecurity:   . Worried About Charity fundraiser in the Last Year: Not on file  . Ran Out of Food in the Last Year: Not on file  Transportation Needs:   . Lack of Transportation (Medical): Not on file  . Lack of Transportation (Non-Medical): Not on file  Physical Activity:   . Days of Exercise per Week: Not on file  . Minutes of Exercise per Session: Not on file  Stress:   . Feeling of  Stress : Not on file  Social Connections:   . Frequency of Communication with Friends and Family: Not on file  . Frequency of Social Gatherings with Friends and Family: Not on file  . Attends Religious Services: Not on file  . Active Member of Clubs or Organizations: Not on file  . Attends Banker Meetings: Not on file  . Marital Status: Not on file  Intimate Partner Violence:   . Fear of Current or Ex-Partner: Not on file  . Emotionally Abused: Not on file  . Physically Abused: Not on file  . Sexually Abused: Not on file    Past Surgical History:  Procedure Laterality Date  . CATARACT EXTRACTION     x 3  . EYE SURGERY  1968, 1985, 1987   cataracts  . FLEXIBLE SIGMOIDOSCOPY N/A 02/11/2015   Procedure: FLEXIBLE SIGMOIDOSCOPY;  Surgeon: Iva Boop,  MD;  Location: [facility];  Service: Endoscopy;  Laterality: N/A;  . INNER EAR SURGERY     rt ear  . INNER EAR SURGERY  1992  . surgery - right arm     1983    Family History  Problem Relation Age of Onset  . Breast cancer Mother 71       breast  . Huntington's disease Mother   . Stroke Father   . Heart disease Father   . Hypertension Father   . Heart attack Father   . Hypertension Brother   . Hyperlipidemia Neg Hx   . Diabetes Neg Hx     Allergies  Allergen Reactions  . Amoxicillin Hives, Itching and Other (See Comments)    White tongue; ? hives  . Fluticasone     Other reaction(s): Other Sores inside nose  . Sulfa Antibiotics Hives and Rash  . Flonase [Fluticasone Propionate]     Nasal Sores  . Terfenadine     ? reaction    Current Outpatient Medications on File Prior to Visit  Medication Sig Dispense Refill  . allopurinol (ZYLOPRIM) 100 MG tablet Take 1 tablet (100 mg total) by mouth daily. 90 tablet 1  . amLODipine (NORVASC) 5 MG tablet TAKE 1 TABLET BY MOUTH EVERY DAY 90 tablet 0  . Azelastine HCl 0.15 % SOLN Two sprays each nostril 1-2 times a day as needed. 30 mL 5  . BAYER MICROLET LANCETS lancets Use 1x a day 100 each 4  . betamethasone dipropionate (DIPROLENE) 0.05 % ointment Apply topically 2 (two) times daily. 30 g 0  . budesonide (PULMICORT) 0.5 MG/2ML nebulizer solution MIX WITH 240 CC SALINE IN THE NEILMED BOTTLE AS DIRECTED AND RINSE SINUSES TWICE A DAY. 360 mL 1  . clotrimazole-betamethasone (LOTRISONE) cream Apply 1 application topically 2 (two) times daily. 30 g 0  . Continuous Blood Gluc Receiver (FREESTYLE LIBRE 14 DAY READER) DEVI 1 Device by Does not apply route every 14 (fourteen) days. 1 Device 0  . Continuous Blood Gluc Sensor (FREESTYLE LIBRE 14 DAY SENSOR) MISC 1 each by Subdermal route every 14 (fourteen) days. 6 each 2  . CONTOUR NEXT TEST test strip USE ONCE DAILY - E11.51, E11.65 DISPENSE CONTOUR NEXT STRIPS 100 strip 3  .  cyclobenzaprine (FLEXERIL) 10 MG tablet Take 1 tablet (10 mg total) by mouth at bedtime as needed for muscle spasms. 30 tablet 1  . cyproheptadine (PERIACTIN) 4 MG tablet TAKE 1 TABLET (4 MG TOTAL) BY MOUTH AT BEDTIME. 90 tablet 1  . diphenhydrAMINE (BENADRYL) 25 MG tablet     .  empagliflozin (JARDIANCE) 25 MG TABS tablet Take 25 mg by mouth daily. 30 tablet 6  . EPINEPHrine 0.3 mg/0.3 mL IJ SOAJ injection Use as directed for severe allergic reaction. 2 each 3  . famotidine (PEPCID) 40 MG/5ML suspension TAKE 2.5 MLS (20 MG TOTAL) BY MOUTH DAILY. 100 mL 2  . FIBER SELECT GUMMIES PO Take 1 tablet by mouth daily.    . finasteride (PROSCAR) 5 MG tablet Take 5 mg by mouth daily.    Marland Kitchen gabapentin (NEURONTIN) 100 MG capsule TAKE 1 CAPSULE BY MOUTH THREE TIMES A DAY 90 capsule 3  . gentamicin cream (GARAMYCIN) 0.1 % Apply 1 application topically 3 (three) times daily. 30 g 1  . GuaiFENesin 200 MG/10ML SOLN     . HYDROcodone-acetaminophen (HYCET) 7.5-325 mg/15 ml solution TAKE 15 ML EVERY 6 HOURS AS NEEDED FOR PAIN 473 mL 0  . hydrOXYzine (ATARAX/VISTARIL) 25 MG tablet Take 1 tablet by mouth every 8 hours as needed for anxiety or itching 270 tablet 1  . Ibuprofen-Acetaminophen (ADVIL DUAL ACTION) 125-250 MG TABS Take 2 tablets by mouth daily.    Marland Kitchen JANUVIA 100 MG tablet TAKE 1 TABLET BY MOUTH EVERY DAY 30 tablet 2  . levocetirizine (XYZAL) 5 MG tablet TAKE 1 TABLET BY MOUTH EVERY DAY 90 tablet 0  . metoprolol tartrate (LOPRESSOR) 25 MG tablet TAKE 1 TABLET BY MOUTH TWICE A DAY 180 tablet 1  . mometasone (ELOCON) 0.1 % ointment APPLY TO AFFECTED AREA EVERY DAY AS NEEDED FOR RASH 45 g 0  . Multiple Vitamins-Minerals (AIRBORNE GUMMIES PO) Take 1 each by mouth daily.    . NONFORMULARY OR COMPOUNDED ITEM Compression socks  #1   15-20 mm/hg  Dx edema 1 each 1  . polyethylene glycol powder (GLYCOLAX/MIRALAX) 17 GM/SCOOP powder TAKE 17 GRAMS BY MOUTH 2 TIMES DAILY AS NEEDED. 527 g 5  . pravastatin (PRAVACHOL) 20  MG tablet TAKE 1 TABLET BY MOUTH EVERY DAY 90 tablet 0  . PROCTOFOAM HC rectal foam APPLY FOAM THREE TIMES A DAY AS NEEDED FOR 7 DAYS 10 g 2  . terazosin (HYTRIN) 5 MG capsule Take by mouth daily.  3  . triamcinolone cream (KENALOG) 0.1 % Apply 1 application topically 2 (two) times daily. 30 g 0  . venlafaxine XR (EFFEXOR-XR) 75 MG 24 hr capsule TAKE 1 CAPSULE (75 MG TOTAL) BY MOUTH DAILY WITH BREAKFAST. 90 capsule 1  . azithromycin (ZITHROMAX Z-PAK) 250 MG tablet As directed (Patient not taking: Reported on 11/22/2019) 6 each 1  . doxycycline (VIBRA-TABS) 100 MG tablet Take 1 tablet (100 mg total) by mouth 2 (two) times daily. (Patient not taking: Reported on 04/05/2020) 20 tablet 0  . ondansetron (ZOFRAN-ODT) 4 MG disintegrating tablet USE UP TO THREE TIMES A DAY AS NEEDED FOR NAUSEA. (Patient not taking: Reported on 04/05/2020) 10 tablet 0   No current facility-administered medications on file prior to visit.    BP 118/65   Pulse 85   Temp 98.1 F (36.7 C) (Oral)   Resp 18   Ht $R'5\' 9"'Cw$  (1.753 m)   Wt 184 lb (83.5 kg)   SpO2 100%   BMI 27.17 kg/m       Objective:   Physical Exam  General Mental Status- Alert. General Appearance- Not in acute distress.   Skin General: Color- Normal Color. Moisture- Normal Moisture.  Neck Carotid Arteries- Normal color. Moisture- Normal Moisture. No carotid bruits. No JVD.  Chest and Lung Exam Auscultation: Breath Sounds:-Normal.  Cardiovascular Auscultation:Rythm- Regular.  Murmurs & Other Heart Sounds:Auscultation of the heart reveals- No Murmurs.  Abdomen Inspection:-Inspeection Normal. Palpation/Percussion:Note:No mass. Palpation and Percussion of the abdomen reveal- Non Tender, Non Distended + BS, no rebound or guarding.  Skin- scattered rash all over. Worse lower back and groin area.  heent- canals clear, tm mild pink but not red. No sinus pressure.  Neurologic Cranial Nerve exam:- CN III-XII intact(No nystagmus), symmetric  smile. Drift Test:- No drift. Romberg Exam:- Negative.  Heal to Toe Gait exam:-Normal. Finger to Nose:- Normal/Intact Strength:- 5/5 equal and symmetric strength both upper and lower extremities.      Assessment & Plan:  You have some recent ear pressure with mild nasal congestion and some sneezing.  I think this represents eustachian tube dysfunction and allergic rhinitis.  Will prescribe Astelin nasal spray since he cannot take Flonase.  If ear pain occurs or increasing pressure please let us know.  Recent rash as diffuse and started 2 days after finishing clindamycin.  Considering possible allergic reaction.  No shortness of breath reported.  No lip or tongue swelling.  Would recommend Xyzal antihistamine at night for itching and will prescribe topical triamcinolone to use on the lower back area.  Hopefully your rash will taper off.  If not then have to consider tapered course of prednisone.  However you are diabetic and your last A1c was high so would have to do that with caution.  If we have to give oral prednisone then would have to provide mealtime sliding scale insulin.  For diabetes continue Januvia and Jardiance.  Will get CMP and A1c today.  You have diarrhea recently for couple days after taking clindamycin.  Please pick up stool kit in the lab and if you have loose stools that persist go ahead and turn in stool kit today or tomorrow morning at the latest.  If diarrhea worsens let us know.  After turning in stool kit you could use Imodium for relief.  Also make sure that you stay well-hydrated and eat bland foods.  Follow-up in 7 to 10 days or as needed.  Mackie Pai, PA-C   Time spent with patient today was 40 minutes which consisted of chart review, discussing diagnoses, work up, treatment, answering questions and documentation.

## 2020-04-06 LAB — COMPREHENSIVE METABOLIC PANEL
AG Ratio: 2 (calc) (ref 1.0–2.5)
ALT: 19 U/L (ref 9–46)
AST: 16 U/L (ref 10–35)
Albumin: 4.3 g/dL (ref 3.6–5.1)
Alkaline phosphatase (APISO): 80 U/L (ref 35–144)
BUN: 9 mg/dL (ref 7–25)
CO2: 30 mmol/L (ref 20–32)
Calcium: 9.4 mg/dL (ref 8.6–10.3)
Chloride: 101 mmol/L (ref 98–110)
Creat: 1.01 mg/dL (ref 0.70–1.33)
Globulin: 2.2 g/dL (calc) (ref 1.9–3.7)
Glucose, Bld: 143 mg/dL — ABNORMAL HIGH (ref 65–99)
Potassium: 4.7 mmol/L (ref 3.5–5.3)
Sodium: 140 mmol/L (ref 135–146)
Total Bilirubin: 0.5 mg/dL (ref 0.2–1.2)
Total Protein: 6.5 g/dL (ref 6.1–8.1)

## 2020-04-06 LAB — HEMOGLOBIN A1C
Hgb A1c MFr Bld: 9.7 % of total Hgb — ABNORMAL HIGH (ref <5.7)
Mean Plasma Glucose: 232 (calc)
eAG (mmol/L): 12.8 (calc)

## 2020-04-10 ENCOUNTER — Telehealth: Payer: Self-pay | Admitting: Medical

## 2020-04-10 MED ORDER — METFORMIN HCL 500 MG PO TABS: 500.0000 mg | ORAL_TABLET | Freq: Two times a day (BID) | ORAL | 3 refills | Status: DC

## 2020-04-10 NOTE — Telephone Encounter (Signed)
Rx metformin sent to pt pharmacy. 

## 2020-04-10 NOTE — Telephone Encounter (Signed)
Opened to review 

## 2020-04-12 ENCOUNTER — Encounter: Payer: Self-pay | Admitting: Family Medicine

## 2020-04-16 ENCOUNTER — Other Ambulatory Visit: Payer: Self-pay | Admitting: Neurosurgery

## 2020-04-16 DIAGNOSIS — D497 Neoplasm of unspecified behavior of endocrine glands and other parts of nervous system: Secondary | ICD-10-CM

## 2020-04-24 DIAGNOSIS — Z79891 Long term (current) use of opiate analgesic: Secondary | ICD-10-CM | POA: Diagnosis not present

## 2020-04-24 DIAGNOSIS — Z79899 Other long term (current) drug therapy: Secondary | ICD-10-CM | POA: Diagnosis not present

## 2020-04-24 DIAGNOSIS — M5134 Other intervertebral disc degeneration, thoracic region: Secondary | ICD-10-CM | POA: Diagnosis not present

## 2020-04-24 DIAGNOSIS — M5030 Other cervical disc degeneration, unspecified cervical region: Secondary | ICD-10-CM | POA: Diagnosis not present

## 2020-04-24 DIAGNOSIS — G894 Chronic pain syndrome: Secondary | ICD-10-CM | POA: Diagnosis not present

## 2020-04-24 DIAGNOSIS — M47812 Spondylosis without myelopathy or radiculopathy, cervical region: Secondary | ICD-10-CM | POA: Diagnosis not present

## 2020-04-28 ENCOUNTER — Other Ambulatory Visit: Payer: Self-pay | Admitting: Internal Medicine

## 2020-04-28 DIAGNOSIS — E1151 Type 2 diabetes mellitus with diabetic peripheral angiopathy without gangrene: Secondary | ICD-10-CM

## 2020-04-28 DIAGNOSIS — IMO0002 Reserved for concepts with insufficient information to code with codable children: Secondary | ICD-10-CM

## 2020-04-30 NOTE — Telephone Encounter (Signed)
Last OV 11/17/2018 Please advise.

## 2020-04-30 NOTE — Telephone Encounter (Signed)
He was lost for follow-up for me.  Refills per PCP.  He does need a visit for further refills if he decides to return to see me.

## 2020-05-04 ENCOUNTER — Encounter: Payer: Self-pay | Admitting: Family Medicine

## 2020-05-06 ENCOUNTER — Other Ambulatory Visit: Payer: Self-pay | Admitting: Internal Medicine

## 2020-05-06 DIAGNOSIS — IMO0002 Reserved for concepts with insufficient information to code with codable children: Secondary | ICD-10-CM

## 2020-05-06 DIAGNOSIS — E1151 Type 2 diabetes mellitus with diabetic peripheral angiopathy without gangrene: Secondary | ICD-10-CM

## 2020-05-07 NOTE — Telephone Encounter (Signed)
Dr Renne Crigler refilled it yesterday

## 2020-05-14 ENCOUNTER — Other Ambulatory Visit: Payer: BC Managed Care – PPO

## 2020-05-15 ENCOUNTER — Ambulatory Visit
Admission: RE | Admit: 2020-05-15 | Discharge: 2020-05-15 | Disposition: A | Discharge status: Home / Self Care | Payer: BC Managed Care – PPO | Source: Ambulatory Visit | Attending: Neurosurgery | Admitting: Neurosurgery

## 2020-05-15 ENCOUNTER — Other Ambulatory Visit: Payer: Self-pay

## 2020-05-15 DIAGNOSIS — D497 Neoplasm of unspecified behavior of endocrine glands and other parts of nervous system: Secondary | ICD-10-CM | POA: Diagnosis not present

## 2020-05-15 DIAGNOSIS — D334 Benign neoplasm of spinal cord: Secondary | ICD-10-CM | POA: Diagnosis not present

## 2020-05-15 DIAGNOSIS — G834 Cauda equina syndrome: Secondary | ICD-10-CM | POA: Diagnosis not present

## 2020-05-15 DIAGNOSIS — C720 Malignant neoplasm of spinal cord: Secondary | ICD-10-CM | POA: Diagnosis not present

## 2020-05-15 MED ORDER — GADOBENATE DIMEGLUMINE 529 MG/ML IV SOLN
17.0000 mL | Freq: Once | INTRAVENOUS | Status: AC | PRN
  Administered 2020-05-15: 17 mL via INTRAVENOUS

## 2020-05-16 ENCOUNTER — Other Ambulatory Visit: Payer: Self-pay | Admitting: Family Medicine

## 2020-05-16 DIAGNOSIS — E785 Hyperlipidemia, unspecified: Secondary | ICD-10-CM

## 2020-05-22 DIAGNOSIS — M5030 Other cervical disc degeneration, unspecified cervical region: Secondary | ICD-10-CM | POA: Diagnosis not present

## 2020-05-22 DIAGNOSIS — M5134 Other intervertebral disc degeneration, thoracic region: Secondary | ICD-10-CM | POA: Diagnosis not present

## 2020-05-22 DIAGNOSIS — G9589 Other specified diseases of spinal cord: Secondary | ICD-10-CM | POA: Diagnosis not present

## 2020-05-22 DIAGNOSIS — G894 Chronic pain syndrome: Secondary | ICD-10-CM | POA: Diagnosis not present

## 2020-05-24 ENCOUNTER — Other Ambulatory Visit: Payer: Self-pay

## 2020-05-24 ENCOUNTER — Encounter: Payer: Self-pay | Admitting: Family Medicine

## 2020-05-24 ENCOUNTER — Ambulatory Visit (INDEPENDENT_AMBULATORY_CARE_PROVIDER_SITE_OTHER): Payer: BC Managed Care – PPO | Admitting: Family Medicine

## 2020-05-24 VITALS — BP 114/72 | HR 81 | Temp 98.2°F | Resp 18 | Ht 69.0 in | Wt 183.6 lb

## 2020-05-24 DIAGNOSIS — T7840XA Allergy, unspecified, initial encounter: Secondary | ICD-10-CM

## 2020-05-24 DIAGNOSIS — E1165 Type 2 diabetes mellitus with hyperglycemia: Secondary | ICD-10-CM | POA: Diagnosis not present

## 2020-05-24 DIAGNOSIS — I10 Essential (primary) hypertension: Secondary | ICD-10-CM | POA: Diagnosis not present

## 2020-05-24 DIAGNOSIS — E1169 Type 2 diabetes mellitus with other specified complication: Secondary | ICD-10-CM | POA: Diagnosis not present

## 2020-05-24 DIAGNOSIS — E785 Hyperlipidemia, unspecified: Secondary | ICD-10-CM

## 2020-05-24 HISTORY — DX: Allergy, unspecified, initial encounter: T78.40XA

## 2020-05-24 LAB — LIPID PANEL
Cholesterol: 153 mg/dL (ref 0–200)
HDL: 31.3 mg/dL — ABNORMAL LOW (ref >39.00)
NonHDL: 121.29
Total CHOL/HDL Ratio: 5
Triglycerides: 222 mg/dL — ABNORMAL HIGH (ref 0.0–149.0)
VLDL: 44.4 mg/dL — ABNORMAL HIGH (ref 0.0–40.0)

## 2020-05-24 LAB — COMPREHENSIVE METABOLIC PANEL
ALT: 23 U/L (ref 0–53)
AST: 19 U/L (ref 0–37)
Albumin: 4.4 g/dL (ref 3.5–5.2)
Alkaline Phosphatase: 82 U/L (ref 39–117)
BUN: 8 mg/dL (ref 6–23)
CO2: 33 mEq/L — ABNORMAL HIGH (ref 19–32)
Calcium: 9.2 mg/dL (ref 8.4–10.5)
Chloride: 100 mEq/L (ref 96–112)
Creatinine, Ser: 0.92 mg/dL (ref 0.40–1.50)
GFR: 90.9 mL/min (ref >60.00)
Glucose, Bld: 179 mg/dL — ABNORMAL HIGH (ref 70–99)
Potassium: 3.2 mEq/L — ABNORMAL LOW (ref 3.5–5.1)
Sodium: 141 mEq/L (ref 135–145)
Total Bilirubin: 0.8 mg/dL (ref 0.2–1.2)
Total Protein: 6.8 g/dL (ref 6.0–8.3)

## 2020-05-24 LAB — MICROALBUMIN / CREATININE URINE RATIO
Creatinine,U: 47.2 mg/dL
Microalb Creat Ratio: 1.5 mg/g (ref 0.0–30.0)
Microalb, Ur: <0.7 mg/dL (ref 0.0–1.9)

## 2020-05-24 LAB — HEMOGLOBIN A1C: Hgb A1c MFr Bld: 8.3 % — ABNORMAL HIGH (ref 4.6–6.5)

## 2020-05-24 LAB — LDL CHOLESTEROL, DIRECT: Direct LDL: 75 mg/dL

## 2020-05-24 MED ORDER — FLUTICASONE PROPIONATE 0.05 % EX CREA: TOPICAL_CREAM | Freq: Two times a day (BID) | CUTANEOUS | 0 refills | Status: DC

## 2020-05-24 NOTE — Patient Instructions (Signed)
Drug Allergy A drug allergy is when your body reacts in a bad way to a medicine. The reaction may be mild or very bad. In some cases, it can be life-threatening. If you have an allergic reaction, get help right away. You should get help even if the reaction seems mild. What are the causes? This condition is caused by a reaction in your body's defense system (immune system). The system sees a medicine as being harmful when it is not. What are the signs or symptoms? Symptoms of a mild reaction  A stuffy nose (nasal congestion).  Tingling in your mouth.  An itchy, red rash. Symptoms of a very bad reaction  Swelling of your eyes, lips, face, or tongue.  Swelling of the back of your mouth and your throat.  Breathing loudly (wheezing).  A hoarse voice.  Itchy, red, swollen areas of skin (hives).  Feeling dizzy or light-headed.  Passing out (fainting).  Feeling worried or nervous (anxiety).  Feeling mixed up (confused).  Pain in your belly (abdomen).  Trouble with breathing, talking, or swallowing.  A tight feeling in your chest.  Fast or uneven heartbeats (palpitations).  Throwing up (vomiting).  Watery poop (diarrhea). How is this treated? There is no cure for allergies. An allergic reaction can be treated with:  Medicines to help your symptoms.  Medicines that you breathe into your lungs (respiratory inhalers).  An allergy shot (epinephrine injection). For a very bad reaction, you may need to stay in the hospital. Your doctor may teach you how to use an allergy kit (anaphylaxis kit) and how to give yourself an allergy shot. You can give yourself an allergy shot with what is called an auto-injector "pen." Follow these instructions at home: If you have a very bad allergy:   Always keep an auto-injector pen or your allergy kit with you. These could save your life. Use them as told by your doctor.  Make sure that you, the people who live with you, and your employer  know: ? How to use your allergy kit. ? How to use an auto-injector pen to give you an allergy shot.  If you used your auto-injector pen: ? Get more medicine for it right away. This is important in case you have another reaction. ? Get help right away.  Wear a medical alert bracelet or necklace that says you have an allergy, if your doctor tells you to do this. General instructions  Avoid medicines that you are allergic to.  Take over-the-counter and prescription medicines only as told by your doctor.  Do not drive until your doctor says it is safe.  If you have itchy, red, swollen areas of skin or a rash: ? Use an over-the-counter medicine (antihistamine) as told by your doctor. ? Put cold, wet cloths (cold compresses) on your skin. ? Take baths or showers in cool water. Avoid hot water.  If you had tests done, it is up to you to get your test results. Ask your doctor when your results will be ready.  Tell any doctors who care for you that you have a drug allergy.  Keep all follow-up visits as told by your doctor. This is important. Contact a doctor if:  You think that you are having a mild allergic reaction.  You have symptoms that last more than 2 days after your reaction.  Your symptoms get worse.  You get new symptoms. Get help right away if:  You had to use your auto-injector pen. You must go   to the emergency room, even if the medicine seems to be working.  You have symptoms of a very bad allergic reaction. These symptoms may be an emergency. Do not wait to see if the symptoms will go away. Use your auto-injector pen or allergy kit as you have been told. Get medical help right away. Call your local emergency services (911 in the [place] Do not drive yourself to the hospital. Summary  A drug allergy is when your body reacts in a bad way to a medicine.  Take medicines only as told by your doctor.  Tell any doctors who care for you that you have a drug  allergy.  Always keep an auto-injector pen or your allergy kit with you if you have a very bad allergy. This information is not intended to replace advice given to you by your health care provider. Make sure you discuss any questions you have with your health care provider. Document Revised: 12/16/2017 Document Reviewed: 12/16/2017 Elsevier Patient Education  2020 [employer]  

## 2020-05-24 NOTE — Assessment & Plan Note (Signed)
Steroid cream sent to pharmacy Do not want to give prednisone due to DM Pt needs to f/u endo

## 2020-05-24 NOTE — Progress Notes (Signed)
Patient ID: Chase [name], male    DOB: 02-Sep-1960  Age: 59 y.o. MRN: [id number]    Subjective:  Subjective  HPI BLUE WINTHER presents for f/u bp and cholesterol .   He needs a new referral for endo.   Pt also c/o rash on L forearm after MRI.  It is very itchy.      Review of Systems  Constitutional: Negative for appetite change, diaphoresis, fatigue and unexpected weight change.  Eyes: Negative for pain, redness and visual disturbance.  Respiratory: Negative for cough, chest tightness, shortness of breath and wheezing.   Cardiovascular: Negative for chest pain, palpitations and leg swelling.  Endocrine: Negative for cold intolerance, heat intolerance, polydipsia, polyphagia and polyuria.  Genitourinary: Negative for difficulty urinating, dysuria and frequency.  Skin: Positive for rash.  Neurological: Negative for dizziness, light-headedness, numbness and headaches.    History Past Medical History:  Diagnosis Date  . Allergy   . Arthritis   . Blood in stool   . Brain cyst   . Chest pain   . Constipation   . Diabetes mellitus without complication ([place])   . Difficulty urinating   . GERD (gastroesophageal reflux disease)   . Headache(784.0)   . Hearing loss   . Hyperlipidemia   . Hypertension   . Leg swelling   . Nasal congestion   . PONV (postoperative nausea and vomiting)   . Rectal pain   . Rectal ulcer with bleeding after hemorrhoid banding 02/11/2015  . Sleep apnea   . Stroke ([place])   . Stroke ([place])   . Thrombosed hemorrhoids   . Trouble swallowing     He has a past surgical history that includes Cataract extraction; Inner ear surgery; Eye surgery ([place]); Inner ear surgery (1992); surgery - right arm; and Flexible sigmoidoscopy (N/A, 02/11/2015).   His family history includes Breast cancer (age of onset: 62) in his mother; Heart attack in his father; Heart disease in his father; Huntington's disease in his mother; Hypertension in his brother and father;  Stroke in his father.He reports that he has never smoked. He has never used smokeless tobacco. He reports that he does not drink alcohol and does not use drugs.  Current Outpatient Medications on File Prior to Visit  Medication Sig Dispense Refill  . allopurinol (ZYLOPRIM) 100 MG tablet Take 1 tablet (100 mg total) by mouth daily. 90 tablet 1  . amLODipine (NORVASC) 5 MG tablet TAKE 1 TABLET BY MOUTH EVERY DAY 90 tablet 0  . Azelastine HCl 0.15 % SOLN Two sprays each nostril 1-2 times a day as needed. 30 mL 5  . BAYER MICROLET LANCETS lancets Use 1x a day 100 each 4  . betamethasone dipropionate (DIPROLENE) 0.05 % ointment Apply topically 2 (two) times daily. 30 g 0  . budesonide (PULMICORT) 0.5 MG/2ML nebulizer solution MIX WITH 240 CC SALINE IN THE NEILMED BOTTLE AS DIRECTED AND RINSE SINUSES TWICE A DAY. 360 mL 1  . clotrimazole-betamethasone (LOTRISONE) cream Apply 1 application topically 2 (two) times daily. 30 g 0  . Continuous Blood Gluc Receiver (FREESTYLE LIBRE 14 DAY READER) DEVI 1 Device by Does not apply route every 14 (fourteen) days. 1 Device 0  . Continuous Blood Gluc Sensor (FREESTYLE LIBRE 14 DAY SENSOR) MISC 1 each by Subdermal route every 14 (fourteen) days. 6 each 2  . CONTOUR NEXT TEST test strip USE ONCE DAILY - E11.51, E11.65 DISPENSE CONTOUR NEXT STRIPS 100 strip 3  . cyclobenzaprine (FLEXERIL) 10 MG  tablet Take 1 tablet (10 mg total) by mouth at bedtime as needed for muscle spasms. 30 tablet 1  . cyproheptadine (PERIACTIN) 4 MG tablet TAKE 1 TABLET (4 MG TOTAL) BY MOUTH AT BEDTIME. 90 tablet 1  . diphenhydrAMINE (BENADRYL) 25 MG tablet     . empagliflozin (JARDIANCE) 25 MG TABS tablet Take 25 mg by mouth daily. 30 tablet 6  . EPINEPHrine 0.3 mg/0.3 mL IJ SOAJ injection Use as directed for severe allergic reaction. 2 each 3  . famotidine (PEPCID) 40 MG/5ML suspension TAKE 2.5 MLS (20 MG TOTAL) BY MOUTH DAILY. 100 mL 2  . FIBER SELECT GUMMIES PO Take 1 tablet by mouth  daily.    . finasteride (PROSCAR) 5 MG tablet Take 5 mg by mouth daily.    Marland Kitchen gabapentin (NEURONTIN) 100 MG capsule TAKE 1 CAPSULE BY MOUTH THREE TIMES A DAY 90 capsule 3  . gentamicin cream (GARAMYCIN) 0.1 % Apply 1 application topically 3 (three) times daily. 30 g 1  . GuaiFENesin 200 MG/10ML SOLN     . HYDROcodone-acetaminophen (HYCET) 7.5-325 mg/15 ml solution TAKE 15 ML EVERY 6 HOURS AS NEEDED FOR PAIN 473 mL 0  . hydrOXYzine (ATARAX/VISTARIL) 25 MG tablet Take 1 tablet by mouth every 8 hours as needed for anxiety or itching 270 tablet 1  . Ibuprofen-Acetaminophen (ADVIL DUAL ACTION) 125-250 MG TABS Take 2 tablets by mouth daily.    Marland Kitchen JANUVIA 100 MG tablet TAKE 1 TABLET BY MOUTH EVERY DAY 30 tablet 2  . levocetirizine (XYZAL) 5 MG tablet TAKE 1 TABLET BY MOUTH EVERY DAY 90 tablet 0  . metFORMIN (GLUCOPHAGE) 500 MG tablet Take 1 tablet (500 mg total) by mouth 2 (two) times daily with a meal. 60 tablet 3  . metoprolol tartrate (LOPRESSOR) 25 MG tablet TAKE 1 TABLET BY MOUTH TWICE A DAY 180 tablet 1  . mometasone (ELOCON) 0.1 % ointment APPLY TO AFFECTED AREA EVERY DAY AS NEEDED FOR RASH 45 g 0  . Multiple Vitamins-Minerals (AIRBORNE GUMMIES PO) Take 1 each by mouth daily.    . NONFORMULARY OR COMPOUNDED ITEM Compression socks  #1   15-20 mm/hg  Dx edema 1 each 1  . ondansetron (ZOFRAN-ODT) 4 MG disintegrating tablet USE UP TO THREE TIMES A DAY AS NEEDED FOR NAUSEA. 10 tablet 0  . polyethylene glycol powder (GLYCOLAX/MIRALAX) 17 GM/SCOOP powder TAKE 17 GRAMS BY MOUTH 2 TIMES DAILY AS NEEDED. 527 g 5  . pravastatin (PRAVACHOL) 20 MG tablet Take 1 tablet (20 mg total) by mouth daily. 90 tablet 0  . PROCTOFOAM HC rectal foam APPLY FOAM THREE TIMES A DAY AS NEEDED FOR 7 DAYS 10 g 2  . terazosin (HYTRIN) 5 MG capsule Take by mouth daily.  3  . triamcinolone cream (KENALOG) 0.1 % Apply 1 application topically 2 (two) times daily. 30 g 0  . venlafaxine XR (EFFEXOR-XR) 75 MG 24 hr capsule TAKE 1  CAPSULE (75 MG TOTAL) BY MOUTH DAILY WITH BREAKFAST. 90 capsule 1  . azithromycin (ZITHROMAX Z-PAK) 250 MG tablet As directed (Patient not taking: No sig reported) 6 each 1  . doxycycline (VIBRA-TABS) 100 MG tablet Take 1 tablet (100 mg total) by mouth 2 (two) times daily. (Patient not taking: No sig reported) 20 tablet 0   No current facility-administered medications on file prior to visit.     Objective:  Objective  Physical Exam Vitals and nursing note reviewed.  Constitutional:      General: He is sleeping. Vital signs are  normal.     Appearance: He is well-developed and well-nourished.  HENT:     Head: Normocephalic and atraumatic.     Mouth/Throat:     Mouth: Oropharynx is clear and moist.  Eyes:     Extraocular Movements: EOM normal.     Pupils: Pupils are equal, round, and reactive to light.  Neck:     Thyroid: No thyromegaly.  Cardiovascular:     Rate and Rhythm: Normal rate and regular rhythm.     Heart sounds: No murmur heard.   Pulmonary:     Effort: Pulmonary effort is normal. No respiratory distress.     Breath sounds: Normal breath sounds. No wheezing or rales.  Chest:     Chest wall: No tenderness.  Musculoskeletal:        General: No tenderness or edema.     Cervical back: Normal range of motion and neck supple.  Skin:    General: Skin is warm and dry.     Findings: Erythema and rash present.  Neurological:     Mental Status: He is oriented to person, place, and time.  Psychiatric:        Mood and Affect: Mood and affect normal.        Behavior: Behavior normal.        Thought Content: Thought content normal.        Judgment: Judgment normal.    BP 114/72 (BP Location: Right Arm, Patient Position: Sitting, Cuff Size: Normal)   Pulse 81   Temp 98.2 F (36.8 C) (Oral)   Resp 18   Ht 5\' 9"  (1.753 m)   Wt 183 lb 9.6 oz (83.3 kg)   SpO2 98%   BMI 27.11 kg/m  Wt Readings from Last 3 Encounters:  05/24/20 183 lb 9.6 oz (83.3 kg)  04/05/20 184 lb  (83.5 kg)  11/22/19 183 lb 12.8 oz (83.4 kg)     Lab Results  Component Value Date   WBC 9.5 12/10/2017   HGB 14.1 12/10/2017   HCT 41.9 12/10/2017   PLT 350.0 12/10/2017   GLUCOSE 143 (H) 04/05/2020   CHOL 185 11/22/2019   TRIG 230.0 (H) 11/22/2019   HDL 34.80 (L) 11/22/2019   LDLDIRECT 100.0 11/22/2019   LDLCALC 102 (H) 08/04/2017   ALT 19 04/05/2020   AST 16 04/05/2020   NA 140 04/05/2020   K 4.7 04/05/2020   CL 101 04/05/2020   CREATININE 1.01 04/05/2020   BUN 9 04/05/2020   CO2 30 04/05/2020   TSH 2.44 05/24/2019   PSA 1.01 10/03/2014   INR 1.11 02/10/2015   HGBA1C 9.7 (H) 04/05/2020   MICROALBUR <0.7 05/24/2020    MR Lumbar Spine W Wo Contrast  Result Date: 05/16/2020 CLINICAL DATA:  Intradural, extramedullary spinal tumor. EXAM: MRI LUMBAR SPINE WITHOUT AND WITH CONTRAST TECHNIQUE: Multiplanar and multiecho pulse sequences of the lumbar spine were obtained without and with intravenous contrast. CONTRAST:  73mL MULTIHANCE GADOBENATE DIMEGLUMINE 529 MG/ML IV SOLN COMPARISON:  MRI of the lumbar spine January 07, 2020. FINDINGS: Segmentation:  Standard. Alignment:  Physiologic. Vertebrae:  No fracture, evidence of discitis, or bone lesion. Conus medullaris and cauda equina: Conus extends to the L2 level. Conus appear normal. No significant change in size of the intradural, extramedullary lesion in the posterior aspect of the thecal sac at the L2 level, measuring 10 mm. Postcontrast images show intense contrast enhancement of this lesion without evidence of additional focus of abnormal contrast enhancement within the thecal sac. The  lesion causes mild anterior displacement of the conus. Paraspinal and other soft tissues: Negative. Disc levels: No significant disc bulge or herniation, spinal canal or neural foraminal stenosis at any level. IMPRESSION: 1. No significant change in size of the intradural, extramedullary lesion in the posterior aspect of the thecal sac at the L2 level,  measuring 10 mm, and demonstrating intense contrast enhancement. Differential diagnosis remains the same and includes nerve sheath tumor, myxopapillary ependymoma and metastasis. 2. No significant degenerative changes of the lumbar spine. Electronically Signed   By: Pedro Earls M.D.   On: 05/16/2020 09:17     Assessment & Plan:  Plan  I am having [name] "Jeff" start on fluticasone. I am also having him maintain his [place] PO, ondansetron, diphenhydrAMINE, guaiFENesin, mometasone, gentamicin cream, Multiple Vitamins-Minerals (AIRBORNE GUMMIES PO), Bayer Microlet Lancets, terazosin, NONFORMULARY OR COMPOUNDED ITEM, betamethasone dipropionate, FreeStyle Libre 14 Day Reader, FreeStyle Libre 14 Day Sensor, triamcinolone, finasteride, Azelastine HCl, EPINEPHrine, clotrimazole-betamethasone, Advil Dual Action, polyethylene glycol powder, HYDROcodone-acetaminophen, cyproheptadine, azithromycin, Jardiance, Contour Next Test, hydrOXYzine, famotidine, metoprolol tartrate, budesonide, doxycycline, allopurinol, Proctofoam HC, levocetirizine, gabapentin, venlafaxine XR, amLODipine, cyclobenzaprine, metFORMIN, Januvia, and pravastatin.  Meds ordered this encounter  Medications  . fluticasone (CUTIVATE) 0.05 % cream    Sig: Apply topically 2 (two) times daily.    Dispense:  30 g    Refill:  0    Problem List Items Addressed This Visit      Unprioritized   HTN (hypertension)   Relevant Orders   Lipid panel   Comprehensive metabolic panel   Microalbumin / creatinine urine ratio (Completed)    Other Visit Diagnoses    Hyperlipidemia associated with type 2 diabetes mellitus ([place])    -  Primary   Relevant Orders   Lipid panel   Comprehensive metabolic panel   Uncontrolled type 2 diabetes mellitus with hyperglycemia ([place])       Relevant Orders   Ambulatory referral to Endocrinology   Hemoglobin A1c   Microalbumin / creatinine urine ratio (Completed)   Allergic  reaction to drug, initial encounter       Relevant Medications   fluticasone (CUTIVATE) 0.05 % cream      Follow-up: Return in about 6 months (around 11/22/2020), or if symptoms worsen or fail to improve, for annual exam, fasting.  Ann Held, DO

## 2020-05-24 NOTE — Assessment & Plan Note (Signed)
Encouraged heart healthy diet, increase exercise, avoid trans fats, consider a krill oil cap daily 

## 2020-05-24 NOTE — Assessment & Plan Note (Signed)
Well controlled, no changes to meds. Encouraged heart healthy diet such as the DASH diet and exercise as tolerated.  °

## 2020-05-26 ENCOUNTER — Other Ambulatory Visit: Payer: Self-pay | Admitting: Family Medicine

## 2020-05-26 ENCOUNTER — Other Ambulatory Visit: Payer: Self-pay | Admitting: Allergy & Immunology

## 2020-05-26 DIAGNOSIS — K649 Unspecified hemorrhoids: Secondary | ICD-10-CM

## 2020-05-26 DIAGNOSIS — J0141 Acute recurrent pansinusitis: Secondary | ICD-10-CM

## 2020-05-28 ENCOUNTER — Other Ambulatory Visit: Payer: Self-pay | Admitting: Family Medicine

## 2020-05-28 DIAGNOSIS — E876 Hypokalemia: Secondary | ICD-10-CM

## 2020-05-28 DIAGNOSIS — Z6826 Body mass index (BMI) 26.0-26.9, adult: Secondary | ICD-10-CM | POA: Diagnosis not present

## 2020-05-28 DIAGNOSIS — D497 Neoplasm of unspecified behavior of endocrine glands and other parts of nervous system: Secondary | ICD-10-CM | POA: Diagnosis not present

## 2020-05-28 DIAGNOSIS — I10 Essential (primary) hypertension: Secondary | ICD-10-CM | POA: Diagnosis not present

## 2020-05-28 NOTE — Telephone Encounter (Signed)
Patient uses for recurrent sinusitis.  Do you want to continue?

## 2020-05-29 ENCOUNTER — Other Ambulatory Visit: Payer: Self-pay | Admitting: Family Medicine

## 2020-05-29 ENCOUNTER — Other Ambulatory Visit: Payer: Self-pay | Admitting: *Deleted

## 2020-05-29 DIAGNOSIS — R21 Rash and other nonspecific skin eruption: Secondary | ICD-10-CM

## 2020-05-29 DIAGNOSIS — E876 Hypokalemia: Secondary | ICD-10-CM

## 2020-05-29 DIAGNOSIS — I10 Essential (primary) hypertension: Secondary | ICD-10-CM

## 2020-05-29 DIAGNOSIS — E1165 Type 2 diabetes mellitus with hyperglycemia: Secondary | ICD-10-CM

## 2020-05-29 DIAGNOSIS — E785 Hyperlipidemia, unspecified: Secondary | ICD-10-CM

## 2020-05-29 MED ORDER — PRAVASTATIN SODIUM 20 MG PO TABS: 40.0000 mg | ORAL_TABLET | Freq: Every day | ORAL | 1 refills | Status: DC

## 2020-05-29 NOTE — Addendum Note (Signed)
Addended by: Kem Boroughs D on: 05/29/2020 12:01 PM   Modules accepted: Orders

## 2020-05-30 ENCOUNTER — Other Ambulatory Visit: Payer: Self-pay

## 2020-05-30 ENCOUNTER — Other Ambulatory Visit (INDEPENDENT_AMBULATORY_CARE_PROVIDER_SITE_OTHER): Payer: BC Managed Care – PPO

## 2020-05-30 DIAGNOSIS — E785 Hyperlipidemia, unspecified: Secondary | ICD-10-CM | POA: Diagnosis not present

## 2020-05-30 DIAGNOSIS — I10 Essential (primary) hypertension: Secondary | ICD-10-CM | POA: Diagnosis not present

## 2020-05-30 DIAGNOSIS — E1165 Type 2 diabetes mellitus with hyperglycemia: Secondary | ICD-10-CM | POA: Diagnosis not present

## 2020-05-30 DIAGNOSIS — E876 Hypokalemia: Secondary | ICD-10-CM

## 2020-05-31 LAB — COMPREHENSIVE METABOLIC PANEL
ALT: 25 U/L (ref 0–53)
AST: 19 U/L (ref 0–37)
Albumin: 4.3 g/dL (ref 3.5–5.2)
Alkaline Phosphatase: 83 U/L (ref 39–117)
BUN: 6 mg/dL (ref 6–23)
CO2: 32 mEq/L (ref 19–32)
Calcium: 9.2 mg/dL (ref 8.4–10.5)
Chloride: 101 mEq/L (ref 96–112)
Creatinine, Ser: 0.91 mg/dL (ref 0.40–1.50)
GFR: 92.09 mL/min (ref >60.00)
Glucose, Bld: 174 mg/dL — ABNORMAL HIGH (ref 70–99)
Potassium: 3.3 mEq/L — ABNORMAL LOW (ref 3.5–5.1)
Sodium: 142 mEq/L (ref 135–145)
Total Bilirubin: 0.8 mg/dL (ref 0.2–1.2)
Total Protein: 6.8 g/dL (ref 6.0–8.3)

## 2020-05-31 LAB — LIPID PANEL
Cholesterol: 145 mg/dL (ref 0–200)
HDL: 31.3 mg/dL — ABNORMAL LOW (ref >39.00)
LDL Cholesterol: 76 mg/dL (ref 0–99)
NonHDL: 113.73
Total CHOL/HDL Ratio: 5
Triglycerides: 189 mg/dL — ABNORMAL HIGH (ref 0.0–149.0)
VLDL: 37.8 mg/dL (ref 0.0–40.0)

## 2020-05-31 LAB — HEMOGLOBIN A1C: Hgb A1c MFr Bld: 8.2 % — ABNORMAL HIGH (ref 4.6–6.5)

## 2020-06-04 ENCOUNTER — Other Ambulatory Visit: Payer: Self-pay | Admitting: Family Medicine

## 2020-06-04 DIAGNOSIS — E1169 Type 2 diabetes mellitus with other specified complication: Secondary | ICD-10-CM

## 2020-06-04 DIAGNOSIS — E785 Hyperlipidemia, unspecified: Secondary | ICD-10-CM

## 2020-06-05 ENCOUNTER — Other Ambulatory Visit: Payer: Self-pay | Admitting: Family Medicine

## 2020-06-05 ENCOUNTER — Other Ambulatory Visit: Payer: Self-pay

## 2020-06-05 DIAGNOSIS — M25511 Pain in right shoulder: Secondary | ICD-10-CM

## 2020-06-05 DIAGNOSIS — E785 Hyperlipidemia, unspecified: Secondary | ICD-10-CM

## 2020-06-05 DIAGNOSIS — E1169 Type 2 diabetes mellitus with other specified complication: Secondary | ICD-10-CM

## 2020-06-05 MED ORDER — FENOFIBRATE 160 MG PO TABS: 160.0000 mg | ORAL_TABLET | Freq: Every day | ORAL | 0 refills | Status: DC

## 2020-06-12 ENCOUNTER — Encounter: Payer: Self-pay | Admitting: Internal Medicine

## 2020-06-12 ENCOUNTER — Other Ambulatory Visit: Payer: Self-pay

## 2020-06-12 ENCOUNTER — Encounter: Payer: BC Managed Care – PPO | Attending: Internal Medicine | Admitting: Nutrition

## 2020-06-12 ENCOUNTER — Ambulatory Visit (INDEPENDENT_AMBULATORY_CARE_PROVIDER_SITE_OTHER): Payer: BC Managed Care – PPO | Admitting: Internal Medicine

## 2020-06-12 VITALS — BP 128/88 | HR 91 | Ht 69.0 in | Wt 187.0 lb

## 2020-06-12 DIAGNOSIS — E1151 Type 2 diabetes mellitus with diabetic peripheral angiopathy without gangrene: Secondary | ICD-10-CM | POA: Diagnosis not present

## 2020-06-12 DIAGNOSIS — Z23 Encounter for immunization: Secondary | ICD-10-CM

## 2020-06-12 DIAGNOSIS — E1165 Type 2 diabetes mellitus with hyperglycemia: Secondary | ICD-10-CM

## 2020-06-12 DIAGNOSIS — E785 Hyperlipidemia, unspecified: Secondary | ICD-10-CM

## 2020-06-12 DIAGNOSIS — IMO0002 Reserved for concepts with insufficient information to code with codable children: Secondary | ICD-10-CM

## 2020-06-12 MED ORDER — DEXCOM G6 SENSOR MISC: 1.0000 | 3 refills | Status: DC

## 2020-06-12 MED ORDER — OZEMPIC (0.25 OR 0.5 MG/DOSE) 2 MG/1.5ML ~~LOC~~ SOPN: 0.5000 mg | PEN_INJECTOR | SUBCUTANEOUS | 3 refills | Status: DC

## 2020-06-12 MED ORDER — DEXCOM G6 TRANSMITTER MISC: 1.0000 | 3 refills | Status: DC

## 2020-06-12 NOTE — Patient Instructions (Addendum)
Please continue: - Jardiance 25 mg before breakfast - Januvia 100 mg before breakfast  Try to start: - Metformin 500 mg with dinner x 4 days then increase to 500 mg 2x day with meals. Please crush the tablets.  Please start Ozempic 0.25 mg weekly in a.m. (for example on Sunday morning) x 4 weeks, then increase to 0.5 mg weekly in a.m. if no nausea or hypoglycemia.  Please stop Januvia 100 after you inject the second Ozempic dose.  Please come back for a follow-up appointment in 3 months.

## 2020-06-12 NOTE — Progress Notes (Signed)
Patient ID: Chase [name], male   DOB: Nov 23, 1960, 59 y.o.   MRN: [id number]  This visit occurred during the SARS-CoV-2 public health emergency.  Safety protocols were in place, including screening questions prior to the visit, additional usage of staff PPE, and extensive cleaning of exam room while observing appropriate contact time as indicated for disinfecting solutions.   HPI: Chase [name] is a 59 y.o.-year-old male, presenting for follow-up for DM2, dx in 2014, non-insulin-dependent, uncontrolled, with complications (PVD, cerebro-vascular ds - s/p "mini"strokes, PN). Last visit 1.5 years ago.  HbA1c levels reviewed: Lab Results  Component Value Date   HGBA1C 8.2 (H) 05/30/2020   HGBA1C 8.3 (H) 05/24/2020   HGBA1C 9.7 (H) 04/05/2020   HGBA1C 8.3 (H) 05/24/2019   HGBA1C 7.9 (A) 08/04/2018   HGBA1C 7.5 (A) 12/21/2017   HGBA1C 7.4 09/17/2017   HGBA1C 7.8 06/19/2017   HGBA1C 7.1 (H) 02/19/2017   HGBA1C 7.4 02/21/2016   HGBA1C 9.8 (H) 11/13/2015   HGBA1C 6.8 (H) 02/11/2015   HGBA1C 6.8 (H) 10/03/2014   HGBA1C 6.6 (H) 05/26/2014   HGBA1C 6.4 06/03/2013   HGBA1C 6.7 (H) 09/17/2012   HGBA1C 6.2 03/22/2012   HGBA1C 6.2 11/03/2011   HGBA1C 5.6 03/27/2006   Pt is on a regimen of: - Jardiance 25 mg in am (restarted back ~1 mo ago after being out) - Januvia 100 mg in am (restarted back ~1 mo agoafter being out) In 11/2018, we tried to change from Januvia to Ozempic but this was not covered. Stopped Glipizide b/c rash. Stopped Invokana 100 mg in am >> only got this for 1 mo He was on Metformin 500 mg po bid - in liquid form, as he could not swallow pills. He gets gi upset. He stopped this in 2014.  He is not checking sugars-fingerstick values are not accurate due to Raynaud's syndrome, however, freestyle libre CGM was not approved by his insurance.  Sugars from last visit: - brunch: 130-150 >> 153, 228 >> ? >> 157, 181  - 2h after brunch:  312 >> 150-220 >> n/c >> 206, 243 - before  dinner: 216 >> 150-200 >> n/c - 2h after dinner:  180-210 >> 260 >> n/c - bedtime: 172-214 >> 208 >> ? >> 164, 173 - nighttime: n/c Lowest sugar was 100s >> 157 >> ?;  he has hypoglycemia awareness in the 70s. Highest sugar was "almost 300" >> 246 (January) >> 260.  Glucometer: [employer] Next  At last OV, he was occasional lemonade and ginger ale >> advised to stop.  -+ Mild CKD, last BUN/creatinine:  Lab Results  Component Value Date   BUN 6 05/30/2020   BUN 8 05/24/2020   CREATININE 0.91 05/30/2020   CREATININE 0.92 05/24/2020   -+ HL.  last set of lipids: Lab Results  Component Value Date   CHOL 145 05/30/2020   HDL 31.30 (L) 05/30/2020   LDLCALC 76 05/30/2020   LDLDIRECT 75.0 05/24/2020   TRIG 189.0 (H) 05/30/2020   CHOLHDL 5 05/30/2020  On pravastatin 40. - last eye exam was in 2021: No DR reportedly.  Dr. Lucina Mellow. 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 He has anxiety, PTSD from childhood. Has varicose veins >> wears compresion hoses. He also has a h/o kidney stones.  On allopurinol for gout.  ROS: Constitutional: no weight gain/no weight loss, no fatigue,  no subjective hyperthermia, no subjective hypothermia Eyes: no blurry vision, no xerophthalmia ENT: no sore throat, no nodules palpated in neck, no dysphagia, no odynophagia, no hoarseness Cardiovascular: no CP/no SOB/no palpitations/no leg swelling Respiratory: no cough/no SOB/no wheezing Gastrointestinal: no N/no V/no D/no C/no acid reflux Musculoskeletal: no muscle aches/no joint aches Skin: no rashes, no hair loss Neurological: no tremors/+ numbness/+ tingling/no dizziness  I reviewed pt's medications, allergies, PMH, social hx, family hx, and changes were documented in the history of present illness. Otherwise, unchanged from my initial visit note.  Past Medical History:  Diagnosis Date  .  Allergy   . Arthritis   . Blood in stool   . Brain cyst   . Chest pain   . Constipation   . Diabetes mellitus without complication ([place])   . Difficulty urinating   . GERD (gastroesophageal reflux disease)   . Headache(784.0)   . Hearing loss   . Hyperlipidemia   . Hypertension   . Leg swelling   . Nasal congestion   . PONV (postoperative nausea and vomiting)   . Rectal pain   . Rectal ulcer with bleeding after hemorrhoid banding 02/11/2015  . Sleep apnea   . Stroke ([place])   . Stroke ([place])   . Thrombosed hemorrhoids   . Trouble swallowing    Past Surgical History:  Procedure Laterality Date  . CATARACT EXTRACTION     x 3  . [place]   cataracts  . FLEXIBLE SIGMOIDOSCOPY N/A 02/11/2015   Procedure: FLEXIBLE SIGMOIDOSCOPY;  Surgeon: Gatha Mayer, MD;  Location: [facility];  Service: Endoscopy;  Laterality: N/A;  . INNER EAR SURGERY     rt ear  . INNER EAR SURGERY  1992  . surgery - right arm     1983   Social History   Socioeconomic History  . Marital status: Single    Spouse name: Not on file  . Number of children: 0  . Years of education: Not on file  . Highest education level: Not on file  Occupational History  . Not on file  Tobacco Use  . Smoking status: Never Smoker  . Smokeless tobacco: Never Used  Vaping Use  . Vaping Use: Never used  Substance and Sexual Activity  . Alcohol use: No    Alcohol/week: 0.0 standard drinks    Comment: none  . Drug use: No  . Sexual activity: Never  Other Topics Concern  . Not on file  Social History Narrative   No caffeine intake except for chocolate.  Exercised-walking   Social Determinants of Radio broadcast assistant Strain: Not on file  Food Insecurity: Not on file  Transportation Needs: Not on file  Physical Activity: Not on file  Stress: Not on file  Social Connections: Not on file  Intimate Partner Violence: Not on file   Current Outpatient Medications on File Prior to Visit   Medication Sig Dispense Refill  . allopurinol (ZYLOPRIM) 100 MG tablet Take 1 tablet (100 mg total) by mouth daily. 90 tablet 1  . amLODipine (NORVASC) 5 MG tablet TAKE 1 TABLET BY MOUTH EVERY DAY 90 tablet 0  . Azelastine HCl 0.15 % SOLN Two sprays each nostril 1-2 times a day as needed. 30 mL 5  . BAYER MICROLET LANCETS lancets Use 1x a day 100 each 4  . betamethasone dipropionate (DIPROLENE) 0.05 % ointment Apply topically 2 (two) times daily. 30 g 0  . budesonide (PULMICORT) 0.5 MG/2ML nebulizer solution  MIX WITH 240 CC SALINE IN THE NEILMED BOTTLE AS DIRECTED AND RINSE SINUSES TWICE A DAY. 360 mL 1  . clotrimazole-betamethasone (LOTRISONE) cream APPLY TO AFFECTED AREA TWICE A DAY 30 g 0  . Continuous Blood Gluc Receiver (FREESTYLE LIBRE 14 DAY READER) DEVI 1 Device by Does not apply route every 14 (fourteen) days. 1 Device 0  . Continuous Blood Gluc Sensor (FREESTYLE LIBRE 14 DAY SENSOR) MISC 1 each by Subdermal route every 14 (fourteen) days. 6 each 2  . CONTOUR NEXT TEST test strip USE ONCE DAILY - E11.51, E11.65 DISPENSE CONTOUR NEXT STRIPS 100 strip 3  . cyclobenzaprine (FLEXERIL) 10 MG tablet TAKE 1 TABLET BY MOUTH AT BEDTIME AS NEEDED FOR MUSCLE SPASMS 30 tablet 1  . cyproheptadine (PERIACTIN) 4 MG tablet TAKE 1 TABLET (4 MG TOTAL) BY MOUTH AT BEDTIME. 90 tablet 1  . diphenhydrAMINE (BENADRYL) 25 MG tablet     . empagliflozin (JARDIANCE) 25 MG TABS tablet Take 25 mg by mouth daily. 30 tablet 6  . EPINEPHrine 0.3 mg/0.3 mL IJ SOAJ injection Use as directed for severe allergic reaction. 2 each 3  . famotidine (PEPCID) 40 MG/5ML suspension TAKE 2.5 MLS (20 MG TOTAL) BY MOUTH DAILY. 100 mL 2  . fenofibrate 160 MG tablet Take 1 tablet (160 mg total) by mouth daily. 90 tablet 0  . FIBER SELECT GUMMIES PO Take 1 tablet by mouth daily.    . finasteride (PROSCAR) 5 MG tablet Take 5 mg by mouth daily.    . fluticasone (CUTIVATE) 0.05 % cream Apply topically 2 (two) times daily. 30 g 0  .  gabapentin (NEURONTIN) 100 MG capsule TAKE 1 CAPSULE BY MOUTH THREE TIMES A DAY 90 capsule 3  . gentamicin cream (GARAMYCIN) 0.1 % Apply 1 application topically 3 (three) times daily. 30 g 1  . GuaiFENesin 200 MG/10ML SOLN     . HYDROcodone-acetaminophen (HYCET) 7.5-325 mg/15 ml solution TAKE 15 ML EVERY 6 HOURS AS NEEDED FOR PAIN 473 mL 0  . hydrOXYzine (ATARAX/VISTARIL) 25 MG tablet TAKE 1 TABLET BY MOUTH EVERY 8 HOURS AS NEEDED FOR ANXIETY OR ITCHING 270 tablet 1  . Ibuprofen-Acetaminophen (ADVIL DUAL ACTION) 125-250 MG TABS Take 2 tablets by mouth daily.    Marland Kitchen JANUVIA 100 MG tablet TAKE 1 TABLET BY MOUTH EVERY DAY 30 tablet 2  . levocetirizine (XYZAL) 5 MG tablet TAKE 1 TABLET BY MOUTH EVERY DAY 90 tablet 0  . metFORMIN (GLUCOPHAGE) 500 MG tablet Take 1 tablet (500 mg total) by mouth 2 (two) times daily with a meal. 60 tablet 3  . metoprolol tartrate (LOPRESSOR) 25 MG tablet TAKE 1 TABLET BY MOUTH TWICE A DAY 180 tablet 1  . mometasone (ELOCON) 0.1 % ointment APPLY TO AFFECTED AREA EVERY DAY AS NEEDED FOR RASH 45 g 0  . Multiple Vitamins-Minerals (AIRBORNE GUMMIES PO) Take 1 each by mouth daily.    . NONFORMULARY OR COMPOUNDED ITEM Compression socks  #1   15-20 mm/hg  Dx edema 1 each 1  . ondansetron (ZOFRAN-ODT) 4 MG disintegrating tablet USE UP TO THREE TIMES A DAY AS NEEDED FOR NAUSEA. 10 tablet 0  . polyethylene glycol powder (GLYCOLAX/MIRALAX) 17 GM/SCOOP powder TAKE 17 GRAMS BY MOUTH 2 TIMES DAILY AS NEEDED. 527 g 5  . pravastatin (PRAVACHOL) 20 MG tablet Take 2 tablets (40 mg total) by mouth daily. 180 tablet 1  . PROCTOFOAM HC rectal foam APPLY FOAM THREE TIMES A DAY AS NEEDED FOR 7 DAYS 10 g 2  .  terazosin (HYTRIN) 5 MG capsule Take by mouth daily.  3  . triamcinolone cream (KENALOG) 0.1 % Apply 1 application topically 2 (two) times daily. 30 g 0  . venlafaxine XR (EFFEXOR-XR) 75 MG 24 hr capsule TAKE 1 CAPSULE (75 MG TOTAL) BY MOUTH DAILY WITH BREAKFAST. 90 capsule 1   No current  facility-administered medications on file prior to visit.   Allergies  Allergen Reactions  . Amoxicillin Hives, Itching and Other (See Comments)    White tongue; ? hives  . Fluticasone     Other reaction(s): Other Sores inside nose  . Sulfa Antibiotics Hives and Rash  . Flonase [Fluticasone Propionate]     Nasal Sores  . Terfenadine     ? reaction   Family History  Problem Relation Age of Onset  . Breast cancer Mother 73       breast  . Huntington's disease Mother   . Stroke Father   . Heart disease Father   . Hypertension Father   . Heart attack Father   . Hypertension Brother   . Hyperlipidemia Neg Hx   . Diabetes Neg Hx    PE: BP 128/88   Pulse 91   Ht 5\' 9"  (1.753 m)   Wt 187 lb (84.8 kg)   SpO2 98%   BMI 27.62 kg/m  Wt Readings from Last 3 Encounters:  06/12/20 187 lb (84.8 kg)  05/24/20 183 lb 9.6 oz (83.3 kg)  04/05/20 184 lb (83.5 kg)   Constitutional: Slightly overweight, in NAD Eyes: PERRLA, EOMI, no exophthalmos ENT: moist mucous membranes, no thyromegaly, no cervical lymphadenopathy Cardiovascular: RRR, No MRG Respiratory: CTA B Gastrointestinal: abdomen soft, NT, ND, BS+ Musculoskeletal: no deformities, strength intact in all 4 Skin: moist, warm, no rashes Neurological: no tremor with outstretched hands, DTR normal in all 4  ASSESSMENT: 1. DM2, non-insulin-dependent, uncontrolled, with complications - PVD - cerebro-vascular ds - s/p "mini"strokes - PN  2. HL  3. Dietary counseling  PLAN:  1. Patient with history of uncontrolled type 2 diabetes, on DPP 4 inhibitor and SGLT2 inhibitor.  He returns after a longer absence, of 1.5 years.  In the past, we had a lot of problems with his insurance not covering either Januvia or Jardiance.  We had to complete PAs every year.   Also, his insurance did not cover a freestyle libre CGM despite the fact that patient has Raynaud's phenomenon and he could not check his sugars frequently and when he was  checking, this was not accurate. -When I last saw him I suggested to switch from Januvia to Melvin but he did not obtain this. -His HbA1c increased to 9.7% after our last visit but it started to improve and the latest level was 8.2% 2 weeks ago. -At today's visit, he is not checking sugars at all.  I discussed with the diabetes educator Vaughan Basta Spagnola) and, after the visit, we attached a Dexcom 6 CGM and send supplies to [place] for him.  These should be covered by his insurance. -Based on the latest HbA1c, his diabetes is still not controlled.  For now, I advised him to Metformin 500 mg twice a day but to crush it as he could not swallow the whole tablet in the past.  However, in the meantime, I referred him to [facility] to hopefully get assistance obtaining Ozempic.  He does have a history of heart disease and has had 2 small strokes.  He also has a history of mild CKD, which improved.  She will be an excellent candidate for Ozempic.  If he is able to obtain this, will need to stop Januvia. -We would also send a PA for his Jardiance, which expires this month. - I suggested to:  Patient Instructions  Please continue: - Jardiance 25 mg before breakfast - Januvia 100 mg before breakfast  Please start Ozempic 0.25 mg weekly in a.m. (for example on Sunday morning) x 4 weeks, then increase to 0.5 mg weekly in a.m. if no nausea or hypoglycemia.  Please stop Januvia 100 after you inject the second Ozempic dose.  Please come back for a follow-up appointment in 3-4 months.  - advised to check sugars at different times of the day - 4x a day, rotating check times - advised for yearly eye exams >> he is UTD - return to clinic in 3-4 months     2. HL -Reviewed latest lipid panel from earlier this month: LDL slightly above goal, triglycerides high, HDL low: Lab Results  Component Value Date   CHOL 145 05/30/2020   HDL 31.30 (L) 05/30/2020   LDLCALC 76 05/30/2020   LDLDIRECT 75.0 05/24/2020   TRIG  189.0 (H) 05/30/2020   CHOLHDL 5 05/30/2020  -Continues Pravachol 40 without side effects  + Flu shot today  Orders Placed This Encounter  Procedures  . Flu Vaccine QUAD 36+ mos IM  . AMB Referral to [place] Management  . Amb Referral to DSME/T   Philemon Kingdom, MD PhD [facility] Endocrinology

## 2020-06-13 ENCOUNTER — Other Ambulatory Visit: Payer: Self-pay

## 2020-06-13 NOTE — Patient Outreach (Signed)
Care Coordination  06/13/2020  Chase [name] 1961/06/05 [id number]  Chase [name] is a 59 y.o. year old male who sees Zola Button, Grayling Congress, DO for primary care. The  [facility] Managed Care team was consulted for assistance with Medication and MRI assistance. Mr. Doggett was given information about Care Management services, agreed to services, and verbal consent for services was obtained.  Interventions:  . Patient interviewed and appropriate assessments performed . Collaborated with clinical team regarding patient needs  . SDOH (Social Determinants of Health) assessments performed: Yes    . BSW will research and provide patient with resources for medication and MRI assistance.  Plan:  . Over the next 60 days, patient will work with BSW to address needs related to Medication and MRI assistance . Social Worker will follow up with patient in 14 days.   . Patient has an appointment with [facility] pharmacist on 06/18/2020  Gus Puma, BSW, MHA [facility]  [place]  High Risk Managed Medicaid Team

## 2020-06-18 ENCOUNTER — Telehealth: Payer: Self-pay | Admitting: Nutrition

## 2020-06-18 ENCOUNTER — Other Ambulatory Visit: Payer: Self-pay

## 2020-06-18 NOTE — Telephone Encounter (Signed)
Message left on my machine:  Patient reported that when he got home his dexcom would not read, and said there was a sensor error.  I phoned him back and left a voice mail to call me to schedule another appointment to restart sensor.

## 2020-06-18 NOTE — Progress Notes (Signed)
Patient was trained on how to use the Dexcom G6 CGM.  We reviewed the difference between sensor readings and blood glucose readings, and when he will need to test blood sugars.  He reported good understanding of this. He applied the sensor to his left arm and linked this to his phone and to our system.  He had no final questions

## 2020-06-18 NOTE — Patient Instructions (Signed)
Change sensor every 10 days.  Keep transmitter and reapply it to the new sensor. Change sensor every 3 months.

## 2020-06-19 ENCOUNTER — Other Ambulatory Visit: Payer: Self-pay | Admitting: Family Medicine

## 2020-06-19 ENCOUNTER — Other Ambulatory Visit: Payer: Self-pay | Admitting: Allergy & Immunology

## 2020-06-19 DIAGNOSIS — I10 Essential (primary) hypertension: Secondary | ICD-10-CM

## 2020-06-21 ENCOUNTER — Other Ambulatory Visit: Payer: Self-pay | Admitting: Family Medicine

## 2020-06-21 ENCOUNTER — Other Ambulatory Visit: Payer: Self-pay

## 2020-06-21 DIAGNOSIS — E785 Hyperlipidemia, unspecified: Secondary | ICD-10-CM

## 2020-06-21 MED ORDER — PRAVASTATIN SODIUM 40 MG PO TABS: 40.0000 mg | ORAL_TABLET | Freq: Every day | ORAL | 3 refills | Status: DC

## 2020-06-21 NOTE — Patient Instructions (Signed)
Visit Information  Mr. Beckley was given information about Medicaid Managed Care team care coordination services as a part of their Healthy [facility] Medicaid benefit. Birdena Crandall verbally consented to engagement with the [facility] Managed Care team.   For questions related to your Healthy [facility] health plan, please call: [phone] or visit the homepage here: [email or web address]  If you would like to schedule transportation through your Healthy [facility] plan, please call the following number at least 2 days in advance of your appointment: [phone]  Goals Addressed   None    Patient Care Plan: Medication Management    Problem Identified: Health Promotion or Disease Self-Management (General Plan of Care)     Goal: Self-Management Plan Developed   Note:   Evidence-based guidance:   Review biopsychosocial determinants of health screens.   Determine level of modifiable health risk.   Assess level of patient activation, level of readiness, importance and confidence to make changes.   Evoke change talk using open-ended questions, pros and cons, as well as looking forward.   Identify areas where behavior change may lead to improved health.   Partner with patient to develop a robust self-management plan that includes lifestyle factors, such as weight loss, exercise and healthy nutrition, as well as goals specific to disease risks.   Support patient and family/caregiver active participation in decision-making and self-management plan.   Implement additional goals and interventions based on identified risk factors to reduce health risk.   Facilitate advance care planning.   Review need for preventive screening based on age, sex, family history and health history.   Notes:    Task: Medication Management   Note:   Current Barriers:  . Unable to independently monitor therapeutic efficacy . Unable to achieve control of DM and HTN   . Patient suffered multiple mini-strokes and has aphasia, he is self-conscious about talking because of it . Patient has very low health literacy, has no idea what medicines are for or what disease states do in the body. Spent a majority of visit explaining using metaphors  Pharmacist Clinical Goal(s):  Marland Kitchen Over the next  7 days, patient will achieve adherence to monitoring guidelines and medication adherence to achieve therapeutic efficacy through collaboration with PharmD and provider.  o Patient will test BP daily  Interventions: . Inter-disciplinary care team collaboration (see longitudinal plan of care) . Comprehensive medication review performed; medication list updated in electronic medical record  @RXCPDIABETES @ @RXCPHYPERTENSION @ @RXCPHYPERLIPIDEMIA @ @RXCPMENTALHEALTH @  Patient Goals/Self-Care Activities . Over the next 7 days, patient will:  - check blood pressure Daily, document, and provide at future appointments  Follow Up Plan: The care management team will reach out to the patient again over the next 7 days.       Please see education materials related to DM and HTN provided as print materials.   Patient verbalizes understanding of instructions provided today.   The Managed Medicaid care management team will reach out to the patient again over the next 7 days.   , [facility]

## 2020-06-21 NOTE — Patient Outreach (Signed)
Medicaid Managed Care    Pharmacy Note  06/21/2020 Name: Chase [name] MRN: [id number] DOB: [birth year]  Chase [name] is a 60 y.o. year old male who is a primary care patient of Chase [name], Chase R, DO. [facility] consulted for assistance with disease management and care coordination needs.     Engaged with patient Engaged with patient by telephone for initial visit in response to referral for case management and/or care coordination services.  Chase [name] was given information about Managed Medicaid Care Coordination team services today. Chase [name] agreed to services and verbal consent obtained.   Objective:  Lab Results  Component Value Date   CREATININE 0.91 05/30/2020   CREATININE 0.92 05/24/2020   CREATININE 1.01 04/05/2020    Lab Results  Component Value Date   HGBA1C 8.2 (H) 05/30/2020       Component Value Date/Time   CHOL 145 05/30/2020 1504   TRIG 189.0 (H) 05/30/2020 1504   HDL 31.30 (L) 05/30/2020 1504   CHOLHDL 5 05/30/2020 1504   VLDL 37.8 05/30/2020 1504   LDLCALC 76 05/30/2020 1504   LDLDIRECT 75.0 05/24/2020 1052    Other: (TSH, CBC, Vit D, etc.)  Clinical ASCVD: Yes  The ASCVD Risk score Denman George(Goff DC Jr., et al., 2013) failed to calculate for the following reasons:   The patient has a prior MI or stroke diagnosis    Other: (CHADS2VASc if Afib, PHQ9 if depression, MMRC or CAT for COPD, ACT, DEXA)  BP Readings from Last 3 Encounters:  06/12/20 128/88  05/24/20 114/72  04/05/20 118/65    Assessment/Interventions: Review of patient past medical history, allergies, medications, health status, including review of consultants reports, laboratory and other test data, was performed as part of comprehensive evaluation and provision of chronic care management services.   DM2 -Dx 2014 Jardiance 25mg  Januvia 100mg  (Pharmacy having trouble running medication) Metformin 500mg  (New trial) Ozempic Dexcom: Average is  193 Tried/Failed: Glipizide (Rash), Metformin (GI). Can't take ER because hard for him to swallow Plan: Called pt's pharmacy, they gave me a number [phone]) to call but it was unhelpful. Will coordinate with patient next week to determine plan B   Lipids Pravastatin 20mg  2QD -Only taking 1QD, has trouble swallowing Plan: F/U with PCP to get 1 tablet   Cardio Amlodipine 5mg  Metoprolol Tartrate 25mg  BID Allopurinol -Doesn't check  06/22/19 BP: 165/80  Plan: Check BP daily for 1 week, will f/u next week to see if additional therapy needed  Kidney Stones/Gout -Unsure when last Gout attack Allopurinol 100mg  Plan: At goal,  patient stable/ symptoms controlled   BPH -Painful urination and frequency Terazosin 5mg  Finasteride 5mg  Plan: At goal,  patient stable/ symptoms controlled   Pain Pain scale: With meds:4/10       Without: 7/10 Gabapentin TID (Does work) Venlafaxine XR 75mg  Cyclobenzaprine 10mg  HS Plan: At goal,  patient stable/ symptoms controlled   Stool Softener PEG 17g/scoop PRN Plan: At goal,  patient stable/ symptoms controlled   GERD Famotidine 40mg /575ml Plan: At goal,  patient stable/ symptoms controlled    Depression Depression screen [facility] 2[facility] 10/21/2018 08/04/2017 02/19/2017  Decreased Interest 0 (No Data) 1  Down, Depressed, Hopeless 1 2 2   PHQ - 2 Score 1 2 3   Altered sleeping 3 1 1   Tired, decreased energy 1 2 2   Change in appetite 0 2 2  Feeling bad or failure about yourself  0 3 2  Trouble concentrating 1 3 3  Moving slowly or fidgety/restless 1 2 1   Suicidal thoughts 0 0 1  PHQ-9 Score 7 15 15   Difficult doing work/chores Somewhat difficult - -  Some recent data might be hidden   Venlafaxine XR 75mg  Hydroxyzine 25mg  TID (Taking every day) -Patient states he's content and therapy and doesn't want to change things Plan: At goal,  patient stable/ symptoms controlled     SDOH (Social Determinants of Health) assessments and interventions  performed:    Care Plan  Allergies  Allergen Reactions  . Amoxicillin Hives, Itching and Other (See Comments)    White tongue; ? hives  . Fluticasone     Other reaction(s): Other Sores inside nose  . Sulfa Antibiotics Hives and Rash  . Flonase [Fluticasone Propionate]     Nasal Sores  . Terfenadine     ? reaction    Medications Reviewed Today    Reviewed by Lane Hacker, [facility] (Pharmacist) on 06/21/20 at 1324  Med List Status: <None>  Medication Order Taking? Sig Documenting Provider Last Dose Status Informant  allopurinol (ZYLOPRIM) 100 MG tablet [id number] Yes Take 1 tablet (100 mg total) by mouth daily. Carollee Herter, Kendrick Fries R, DO Taking Active   amLODipine (NORVASC) 5 MG tablet [id number] Yes TAKE 1 TABLET BY MOUTH EVERY DAY Carollee Herter, Alferd Apa, DO Taking Active   Azelastine HCl 0.15 % SOLN [id number] Yes Two sprays each nostril 1-2 times a day as needed. Valentina Shaggy, MD Taking Active   BAYER MICROLET LANCETS lancets [id number] No Use 1x a day  Patient not taking: Reported on 06/21/2020   Philemon Kingdom, MD Not Taking Consider Medication Status and Discontinue   betamethasone dipropionate (DIPROLENE) 0.05 % ointment [id number] No Apply topically 2 (two) times daily.  Patient not taking: Reported on 06/21/2020   Edrick Kins, DPM Not Taking Consider Medication Status and Discontinue   budesonide (PULMICORT) 0.5 MG/2ML nebulizer solution [id number] Yes MIX WITH 240 CC SALINE IN THE NEILMED BOTTLE AS DIRECTED AND RINSE SINUSES TWICE A DAY. Valentina Shaggy, MD Taking Active   clotrimazole-betamethasone Donalynn Furlong) cream [id number] Yes APPLY TO AFFECTED AREA TWICE A DAY Carollee Herter, Alferd Apa, DO Taking Active   Continuous Blood Gluc Receiver (FREESTYLE LIBRE 14 DAY READER) DEVI [id number] No 1 Device by Does not apply route every 14 (fourteen) days.  Patient not taking: No sig reported   Philemon Kingdom, MD Not Taking Consider Medication Status and Discontinue    Continuous Blood Gluc Sensor (DEXCOM G6 SENSOR) MISC [id number] Yes Inject 1 Device into the skin continuous for 10 days. Philemon Kingdom, MD Taking Active   Continuous Blood Gluc Transmit (DEXCOM G6 TRANSMITTER) MISC [id number] Yes 1 Device by Does not apply route every 3 (three) months. Philemon Kingdom, MD Taking Active   CONTOUR NEXT TEST test strip [id number] No USE ONCE DAILY - E11.51, E11.65 DISPENSE CONTOUR NEXT STRIPS  Patient not taking: Reported on 06/21/2020   Philemon Kingdom, MD Not Taking Consider Medication Status and Discontinue   cyclobenzaprine (FLEXERIL) 10 MG tablet [id number] Yes TAKE 1 TABLET BY MOUTH AT BEDTIME AS NEEDED FOR MUSCLE SPASMS Ann Held, DO Taking Active   cyproheptadine (PERIACTIN) 4 MG tablet [id number] Yes TAKE 1 TABLET (4 MG TOTAL) BY MOUTH AT BEDTIME. Dohmeier, Asencion Partridge, MD Taking Active   diphenhydrAMINE (BENADRYL) 25 MG tablet [id number] No   Patient not taking: Reported on 06/21/2020   [provider] Not Taking Active   empagliflozin (JARDIANCE) 25 MG TABS tablet [id number] Yes Take  25 mg by mouth daily. Carlus Pavlov, MD Taking Active   EPINEPHrine 0.3 mg/0.3 mL IJ SOAJ injection [id number] Yes Use as directed for severe allergic reaction. Alfonse Spruce, MD Taking Active   famotidine (PEPCID) 40 MG/5ML suspension [id number] Yes TAKE 2.5 MLS (20 MG TOTAL) BY MOUTH DAILY. Zola Button, Myrene Buddy R, DO Taking Active   FIBER SELECT GUMMIES PO [id number] Yes Take 1 tablet by mouth daily. [provider] Taking Active Self  finasteride (PROSCAR) 5 MG tablet [id number] Yes Take 5 mg by mouth daily. [provider] Taking Active   fluticasone (CUTIVATE) 0.05 % cream [id number] Yes Apply topically 2 (two) times daily. Zola Button, Myrene Buddy R, DO Taking Active   gabapentin (NEURONTIN) 100 MG capsule [id number] Yes TAKE 1 CAPSULE BY MOUTH THREE TIMES A DAY Logan Bores, Larena Glassman, DPM Taking Active   gentamicin cream (GARAMYCIN) 0.1 %  [id number] No Apply 1 application topically 3 (three) times daily.  Patient not taking: Reported on 06/21/2020   Felecia Shelling, DPM Not Taking Consider Medication Status and Discontinue   GuaiFENesin 200 MG/10ML SOLN [id number] Yes  [provider] Taking Active   HYDROcodone-acetaminophen (HYCET) 7.5-325 mg/15 ml solution [id number] Yes TAKE 15 ML EVERY 6 HOURS AS NEEDED FOR PAIN Chase Schultz, DO Taking Active   hydrOXYzine (ATARAX/VISTARIL) 25 MG tablet [id number] Yes TAKE 1 TABLET BY MOUTH EVERY 8 HOURS AS NEEDED FOR ANXIETY OR ITCHING Lowne Chase, Myrene Buddy R, DO Taking Active   Ibuprofen-Acetaminophen (ADVIL DUAL ACTION) 125-250 MG TABS [id number] No Take 2 tablets by mouth daily.  Patient not taking: Reported on 06/21/2020   [provider] Not Taking Active   JANUVIA 100 MG tablet [id number] Yes TAKE 1 TABLET BY MOUTH EVERY DAY Carlus Pavlov, MD Taking Active   levocetirizine (XYZAL) 5 MG tablet [id number] No TAKE 1 TABLET BY MOUTH EVERY DAY  Patient not taking: Reported on 06/21/2020   Alfonse Spruce, MD Not Taking Consider Medication Status and Discontinue   metFORMIN (GLUCOPHAGE) 500 MG tablet [id number] Yes Take 1 tablet (500 mg total) by mouth 2 (two) times daily with a meal. Saguier, Ramon Dredge, PA-C Taking Active   metoprolol tartrate (LOPRESSOR) 25 MG tablet [id number] Yes TAKE 1 TABLET BY MOUTH TWICE A DAY Lowne Chase, Chase R, DO Taking Active   mometasone (ELOCON) 0.1 % ointment [id number] No APPLY TO AFFECTED AREA EVERY DAY AS NEEDED FOR RASH  Patient not taking: Reported on 06/21/2020   Chase Schultz, DO Not Taking Consider Medication Status and Discontinue   Multiple Vitamins-Minerals (AIRBORNE GUMMIES PO) [id number] Yes Take 1 each by mouth daily. [provider] Taking Active   NONFORMULARY OR COMPOUNDED ITEM [id number] No Compression socks  #1   15-20 mm/hg  Dx edema  Patient not taking: Reported on 06/21/2020   Chase Schultz, DO Not  Taking Consider Medication Status and Discontinue   ondansetron (ZOFRAN-ODT) 4 MG disintegrating tablet [id number] No USE UP TO THREE TIMES A DAY AS NEEDED FOR NAUSEA.  Patient not taking: Reported on 06/21/2020   Reather Littler, MD Not Taking Active   polyethylene glycol powder (GLYCOLAX/MIRALAX) 17 GM/SCOOP powder [id number] Yes TAKE 17 GRAMS BY MOUTH 2 TIMES DAILY AS NEEDED. Seabron Spates R, DO Taking Active   pravastatin (PRAVACHOL) 20 MG tablet [id number] Yes Take 2 tablets (40 mg total) by mouth daily. Chase Schultz, DO Taking Active            Med Note Kyung Rudd, Onalee Steinbach  K   Thu Jun 21, 2020  1:01 PM) Taking 1 QD  PROCTOFOAM [facility] rectal foam [id number] No APPLY FOAM THREE TIMES A DAY AS NEEDED FOR 7 DAYS  Patient not taking: Reported on 06/21/2020   Ann Held, DO Not Taking Consider Medication Status and Discontinue   Semaglutide,0.25 or 0.5MG /DOS, (OZEMPIC, 0.25 OR 0.5 MG/DOSE,) 2 MG/1.5ML SOPN [id number] No Inject 0.5 mg into the skin once a week.  Patient not taking: Reported on 06/21/2020   Philemon Kingdom, MD Not Taking Active            Med Note Cato Mulligan Jun 21, 2020 12:46 PM) Insurance won't pay at all  terazosin (HYTRIN) 5 MG capsule [id number] Yes Take by mouth daily. [provider] Taking Active   triamcinolone cream (KENALOG) 0.1 % [id number] No Apply 1 application topically 2 (two) times daily.  Patient not taking: Reported on 06/21/2020   Ann Held, DO Not Taking Consider Medication Status and Discontinue   venlafaxine XR (EFFEXOR-XR) 75 MG 24 hr capsule [id number] Yes TAKE 1 CAPSULE (75 MG TOTAL) BY MOUTH DAILY WITH BREAKFAST. Ann Held, DO Taking Active           Patient Active Problem List   Diagnosis Date Noted  . Allergic drug reaction 05/24/2020  . 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/23/2019  . Hyperlipidemia 11/23/2019  . Bilateral hearing loss 11/23/2019  . Tick bite of back  11/23/2019  . Chronic pain syndrome 11/23/2019  . Chest discomfort 06/08/2019  . Benign neoplasm of brain ([place]) 05/24/2019  . Rash 05/24/2019  . Dry skin 05/09/2019  . Seasonal and perennial allergic rhinitis 05/09/2019  . Food intolerance 05/09/2019  . DDD (degenerative disc disease), cervical 04/26/2019  . DDD (degenerative disc disease), lumbar 04/26/2019  . Chronic pansinusitis 06/30/2018  . Deviated septum 05/31/2018  . Nasal turbinate hypertrophy 05/31/2018  . Insect bite 03/16/2018  . Central sleep apnea associated with atrial fibrillation ([place]) 03/12/2018  . Excessive daytime sleepiness 02/11/2018  . Sleep apnea with use of continuous positive airway pressure (CPAP) 02/11/2018  . 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 Nocturia more than twice per night 02/11/2018  . Bilateral leg and foot pain 03/02/2017  . Fibromyalgia 03/02/2017  . Localized swelling of both lower legs 03/02/2017  . Other chronic pain 03/02/2017  . Raynaud's disease 03/02/2017  . Raynaud's phenomenon 03/02/2017  . Pain in joint of right shoulder 02/21/2017  . DM (diabetes mellitus) type II uncontrolled, periph vascular disorder ([place]) 01/02/2016  . Right shoulder pain 02/15/2015  . Rectal bleeding 02/12/2015  . Rectal ulcer with bleeding after hemorrhoid banding 02/11/2015  . Lower GI bleed   . Blood in stool, frank 02/10/2015  . Anemia 02/10/2015  . Acute renal insufficiency 02/10/2015  . Hemorrhoids 02/10/2015  . GI bleed 02/10/2015  . Acute GI bleeding   . Stroke ([place]) 01/26/2015  . Palpitations 01/26/2015  . Essential hypertension 01/26/2015  . Hyperlipidemia LDL goal <70 01/26/2015  . Cerebrovascular accident, old 11/16/2014  . Internal hemorrhoids 10/22/2014  . 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 OSA on CPAP 10/17/2014  . Persistent headaches 10/17/2014  . Subacute confusional state 10/17/2014  . Chronic cough 08/27/2014  . Sinus  infection 06/30/2014  . Sinusitis, acute, maxillary 02/02/2014  . 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  . Prolapsed hemorrhoids 10/05/2013  . Hemangioma 02/12/2013  . Decreased  hearing of both ears 02/12/2013  . Abnormality of gait 11/10/2012  . Hypersomnia, persistent 11/05/2012  . Unspecified sleep apnea 11/05/2012  . Pain in finger of right hand 10/06/2012  . Internal and external hemorrhoids without complication [id number]  . Varicose veins of lower extremities with other complications [id number]  . Acute upper respiratory infection 08/10/2012  . Bigeminy ? 03/22/2012  . Vertigo 03/19/2012  . Annual physical exam 11/26/2011  . Sleep apnea 11/03/2011  . Insomnia 08/05/2011  . Difficulty urinating   . Hemorrhoid 05/22/2011  . Dysuria 11/29/2010  . Arthralgia 07/25/2010  . Paresthesia 07/25/2010  . HTN (hypertension) 02/04/2010  . ANXIETY DEPRESSION 12/09/2007  . GERD 12/09/2007  . FATIGUE 12/09/2007  . 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 Chronic low back pain 07/20/2007  . SYMPTOM, HYPERSOMNIA NOS 02/19/2007  . Rhinitis, chronic 12/23/2006  . HEADACHE 12/23/2006  . NEPHROLITHIASIS 08/13/2006    Conditions to be addressed/monitored: HTN, DM and Depression  Patient Care Plan: Medication Management    Problem Identified: Health Promotion or Disease Self-Management (General Plan of Care)     Goal: Self-Management Plan Developed   Note:   Evidence-based guidance:   Review biopsychosocial determinants of health screens.   Determine level of modifiable health risk.   Assess level of patient activation, level of readiness, importance and confidence to make changes.   Evoke change talk using open-ended questions, pros and cons, as well as looking forward.   Identify areas where behavior change may lead to improved health.   Partner with patient to develop a robust self-management plan that includes lifestyle factors, such as weight loss, exercise and healthy nutrition, as well  as goals specific to disease risks.   Support patient and family/caregiver active participation in decision-making and self-management plan.   Implement additional goals and interventions based on identified risk factors to reduce health risk.   Facilitate advance care planning.   Review need for preventive screening based on age, sex, family history and health history.   Notes:    Task: Medication Management   Note:   Current Barriers:  . Unable to independently monitor therapeutic efficacy . Unable to achieve control of DM and HTN  . Patient suffered multiple mini-strokes and has aphasia, he is self-conscious about talking because of it .   Pharmacist Clinical Goal(s):  Marland Kitchen Over the next  7 days, patient will achieve adherence to monitoring guidelines and medication adherence to achieve therapeutic efficacy through collaboration with PharmD and provider.  o Patient will test BP daily  Interventions: . Inter-disciplinary care team collaboration (see longitudinal plan of care) . Comprehensive medication review performed; medication list updated in electronic medical record  @RXCPDIABETES @ @RXCPHYPERTENSION @ @RXCPHYPERLIPIDEMIA @ @RXCPMENTALHEALTH @  Patient Goals/Self-Care Activities . Over the next 7 days, patient will:  - check blood pressure Daily, document, and provide at future appointments  Follow Up Plan: The care management team will reach out to the patient again over the next 7 days.       Medication Assistance: Needs PA of DM meds monthly, will coordinate with Pharmacy and look into this more at f/u visit. Called Pharmacy today but they were unable to help  Follow up: Agree/Does not agree  Plan: The care management team will reach out to the patient again over the next 7 days.   Date/time of next scheduled Pharmacist care management/care coordination outreach:  06/28/20  [place] Constable, Pharm.D., Managed Medicaid Pharmacist - [phone]

## 2020-06-25 ENCOUNTER — Other Ambulatory Visit: Payer: Self-pay

## 2020-06-25 NOTE — Patient Instructions (Signed)
Visit Information  Chase [name] was given information about Medicaid Managed Care team care coordination services as a part of their [place] Medicaid benefit. Chase [name] verbally consented to engagement with the [facility] Managed Care team.   For questions related to your [facility], please call: [phone] or visit the homepage here: [email or web address]  If you would like to schedule transportation through your [facility], please call the following number at least 2 days in advance of your appointment: [phone]  Goals Addressed   None    Patient Care Plan: Medication Management    Problem Identified: Health Promotion or Disease Self-Management (General Plan of Care)     Goal: Self-Management Plan Developed   Note:   Evidence-based guidance:   Review biopsychosocial determinants of health screens.   Determine level of modifiable health risk.   Assess level of patient activation, level of readiness, importance and confidence to make changes.   Evoke change talk using open-ended questions, pros and cons, as well as looking forward.   Identify areas where behavior change may lead to improved health.   Partner with patient to develop a robust self-management plan that includes lifestyle factors, such as weight loss, exercise and healthy nutrition, as well as goals specific to disease risks.   Support patient and family/caregiver active participation in decision-making and self-management plan.   Implement additional goals and interventions based on identified risk factors to reduce health risk.   Facilitate advance care planning.   Review need for preventive screening based on age, sex, family history and health history.   Notes:    Task: Medication Management   Note:   Current Barriers:  . Unable to independently monitor therapeutic  efficacy . Unable to achieve control of DM and HTN  . Patient suffered multiple mini-strokes and has aphasia, he is self-conscious about talking because of it . Patient has very low health literacy, has no idea what medicines are for or what disease states do in the body. Spent a majority of visit explaining using metaphors  Pharmacist Clinical Goal(s):  Marland Kitchen Over the next  7 days, patient will achieve adherence to monitoring guidelines and medication adherence to achieve therapeutic efficacy through collaboration with PharmD and provider.  o Patient will test BP daily  Interventions: . Inter-disciplinary care team collaboration (see longitudinal plan of care) . Comprehensive medication review performed; medication list updated in electronic medical record  @RXCPDIABETES @ @RXCPHYPERTENSION @ @RXCPHYPERLIPIDEMIA @ @RXCPMENTALHEALTH @  Patient Goals/Self-Care Activities . Over the next 7 days, patient will:  - check blood pressure Daily, document, and provide at future appointments  Follow Up Plan: The care management team will reach out to the patient again over the next 7 days.        Social Worker will follow up and provide patient with MRI assistance and medication assistance programs.   [name]

## 2020-06-25 NOTE — Patient Instructions (Signed)
Visit Information  Mr. Keetch was given information about Medicaid Managed Care team care coordination services as a part of their [place] Medicaid benefit. [name] verbally consented to engagement with the [facility] Managed Care team.   Per your request, below are the resources for MRI and medication assistance:  [facility] [phone], [place] [phone], and [employer] Patient Assistance Program [phone]. If you have any questions I can be reached at [phone].  For questions related to your [facility], please call: [phone] or visit the homepage here: [email or web address]  If you would like to schedule transportation through your [facility], please call the following number at least 2 days in advance of your appointment: [phone]  Goals Addressed   None    Patient Care Plan: Medication Management    Problem Identified: Health Promotion or Disease Self-Management (General Plan of Care)     Goal: Self-Management Plan Developed   Note:   Evidence-based guidance:   Review biopsychosocial determinants of health screens.   Determine level of modifiable health risk.   Assess level of patient activation, level of readiness, importance and confidence to make changes.   Evoke change talk using open-ended questions, pros and cons, as well as looking forward.   Identify areas where behavior change may lead to improved health.   Partner with patient to develop a robust self-management plan that includes lifestyle factors, such as weight loss, exercise and healthy nutrition, as well as goals specific to disease risks.   Support patient and family/caregiver active participation in decision-making and self-management plan.   Implement additional goals and interventions based on identified risk factors to  reduce health risk.   Facilitate advance care planning.   Review need for preventive screening based on age, sex, family history and health history.   Notes:    Task: Medication Management   Note:   Current Barriers:  . Unable to independently monitor therapeutic efficacy . Unable to achieve control of DM and HTN  . Patient suffered multiple mini-strokes and has aphasia, he is self-conscious about talking because of it . Patient has very low health literacy, has no idea what medicines are for or what disease states do in the body. Spent a majority of visit explaining using metaphors  Pharmacist Clinical Goal(s):  Marland Kitchen Over the next  7 days, patient will achieve adherence to monitoring guidelines and medication adherence to achieve therapeutic efficacy through collaboration with PharmD and provider.  o Patient will test BP daily  Interventions: . Inter-disciplinary care team collaboration (see longitudinal plan of care) . Comprehensive medication review performed; medication list updated in electronic medical record  @RXCPDIABETES @ @RXCPHYPERTENSION @ @RXCPHYPERLIPIDEMIA @ @RXCPMENTALHEALTH @  Patient Goals/Self-Care Activities . Over the next 7 days, patient will:  - check blood pressure Daily, document, and provide at future appointments  Follow Up Plan: The care management team will reach out to the patient again over the next 7 days.       Social Worker will follow up in 30 days.   [name]

## 2020-06-25 NOTE — Patient Outreach (Signed)
Medicaid Managed Care Social Work Note  06/25/2020 Name:  [name] [name] MRN:  [id number] DOB:  25-Apr-1961  [name] [name] is an 60 y.o. year old male who is a primary patient of Chase Held, Chase.  The [facility] Managed Care Coordination team was consulted for assistance with:  MRI and Medication assistance  [name] [name] was given information about Medicaid Managed CareCoordination services today. [name] [name] agreed to services and verbal consent obtained.  Engaged with patient  for by telephone forfollow up visit in response to referral for case management and/or care coordination services.   Assessments/Interventions:  Review of past medical history, allergies, medications, health status, including review of consultants reports, laboratory and other test data, was performed as part of comprehensive evaluation and provision of chronic care management services.  SDOH: (Social Determinant of Health) assessments and interventions performed:  BSW provided patient with information for MRI assistance: [facility] [phone], Medication Assistance Program [phone], and [employer] Patient Assistance Program [phone].   Advanced Directives Status:  Not addressed in this encounter.  Care Plan                 Allergies  Allergen Reactions  . Amoxicillin Hives, Itching and Other (See Comments)    White tongue; ? hives  . Fluticasone     Other reaction(s): Other Sores inside nose  . Sulfa Antibiotics Hives and Rash  . Flonase [Fluticasone Propionate]     Nasal Sores  . Terfenadine     ? reaction    Medications Reviewed Today    Reviewed by [name] [name], [name] Apa, Chase (Physician) on 06/21/20 at 2028  Med List Status: <None>  Medication Order Taking? Sig Documenting Provider Last Dose Status Informant  allopurinol (ZYLOPRIM) 100 MG tablet [id number] No Take 1 tablet (100 mg total) by mouth daily. [name] [name], [name] Fries [name], Chase Taking Active   amLODipine (NORVASC) 5 MG tablet  [id number] No TAKE 1 TABLET BY MOUTH EVERY DAY [name] [name], [name] Apa, Chase Taking Active   Azelastine HCl 0.15 % SOLN [id number] No Two sprays each nostril 1-2 times a day as needed. [name] Shaggy, MD Taking Active   BAYER MICROLET LANCETS lancets [id number] No Use 1x a day  Patient not taking: Reported on 06/21/2020   [name] Kingdom, MD Not Taking Active   betamethasone dipropionate (DIPROLENE) 0.05 % ointment [id number] No Apply topically 2 (two) times daily.  Patient not taking: Reported on 06/21/2020   [name] [name], DPM Not Taking Active   budesonide (PULMICORT) 0.5 MG/2ML nebulizer solution [id number] No MIX WITH 240 CC SALINE IN THE NEILMED BOTTLE AS DIRECTED AND RINSE SINUSES TWICE A DAY. [name] Shaggy, MD Taking Active   clotrimazole-betamethasone Donalynn Furlong) cream [id number] No APPLY TO AFFECTED AREA TWICE A DAY [name] [name], [name] Apa, Chase Taking Active   Continuous Blood Gluc Receiver (FREESTYLE LIBRE 14 DAY READER) DEVI [id number] No 1 Device by Does not apply route every 14 (fourteen) days.  Patient not taking: No sig reported   [name] Kingdom, MD Not Taking Active   Continuous Blood Gluc Sensor (DEXCOM G6 SENSOR) MISC [id number] No Inject 1 Device into the skin continuous for 10 days. [name] Kingdom, MD Taking Active   Continuous Blood Gluc Transmit (DEXCOM G6 TRANSMITTER) MISC [id number] No 1 Device by Does not apply route every 3 (three) months. [name] Kingdom, MD Taking Active   CONTOUR NEXT TEST test strip [id number] No USE ONCE DAILY - E11.51, E11.65 DISPENSE CONTOUR NEXT STRIPS  Patient not taking: Reported on 06/21/2020   [name] Kingdom, MD Not Taking Active   cyclobenzaprine (FLEXERIL) 10 MG tablet [id number] No TAKE 1 TABLET BY MOUTH AT BEDTIME AS NEEDED FOR MUSCLE SPASMS Chase Held, Chase Taking Active   cyproheptadine (PERIACTIN) 4 MG tablet [id number] No TAKE 1 TABLET (4 MG TOTAL) BY MOUTH AT BEDTIME. [name], [name] Partridge, MD Taking Active   diphenhydrAMINE  (BENADRYL) 25 MG tablet [id number] No   Patient not taking: Reported on 06/21/2020   [provider] Not Taking Active   empagliflozin (JARDIANCE) 25 MG TABS tablet [id number] No Take 25 mg by mouth daily. [name] Kingdom, MD Taking Active   EPINEPHrine 0.3 mg/0.3 mL IJ SOAJ injection [id number] No Use as directed for severe allergic reaction. [name] Shaggy, MD Taking Active   famotidine (PEPCID) 40 MG/5ML suspension [id number] No TAKE 2.5 MLS (20 MG TOTAL) BY MOUTH DAILY. [name] [name], [name] Fries [name], Chase Taking Active   FIBER SELECT GUMMIES PO [id number] No Take 1 tablet by mouth daily. [provider] Taking Active Self  finasteride (PROSCAR) 5 MG tablet [id number] No Take 5 mg by mouth daily. [provider] Taking Active   fluticasone (CUTIVATE) 0.05 % cream [id number] No Apply topically 2 (two) times daily. [name] [name], [name] Fries [name], Chase Taking Active   gabapentin (NEURONTIN) 100 MG capsule [id number] No TAKE 1 CAPSULE BY MOUTH THREE TIMES A DAY [name] [name], [name] [name], DPM Taking Active   gentamicin cream (GARAMYCIN) 0.1 % [id number] No Apply 1 application topically 3 (three) times daily.  Patient not taking: Reported on 06/21/2020   [name] [name], DPM Not Taking Active   GuaiFENesin 200 MG/10ML SOLN [id number] No  [provider] Taking Active   HYDROcodone-acetaminophen (HYCET) 7.5-325 mg/15 ml solution [id number] No TAKE 15 ML EVERY 6 HOURS AS NEEDED FOR PAIN Chase Held, Chase Taking Active   hydrOXYzine (ATARAX/VISTARIL) 25 MG tablet [id number] No TAKE 1 TABLET BY MOUTH EVERY 8 HOURS AS NEEDED FOR ANXIETY OR ITCHING [name] [name], [name] Fries [name], Chase Taking Active   Ibuprofen-Acetaminophen (ADVIL DUAL ACTION) 125-250 MG TABS [id number] No Take 2 tablets by mouth daily.  Patient not taking: Reported on 06/21/2020   [provider] Not Taking Active   JANUVIA 100 MG tablet [id number] No TAKE 1 TABLET BY MOUTH EVERY DAY [name] Kingdom, MD Taking Active   levocetirizine  (XYZAL) 5 MG tablet [id number] No TAKE 1 TABLET BY MOUTH EVERY DAY  Patient not taking: Reported on 06/21/2020   [name] Shaggy, MD Not Taking Active   metFORMIN (GLUCOPHAGE) 500 MG tablet [id number] No Take 1 tablet (500 mg total) by mouth 2 (two) times daily with a meal. Saguier, Percell Miller, PA-C Taking Active   metoprolol tartrate (LOPRESSOR) 25 MG tablet [id number] No TAKE 1 TABLET BY MOUTH TWICE A DAY [name] [name], Yvonne [name], Chase Taking Active   mometasone (ELOCON) 0.1 % ointment [id number] No APPLY TO AFFECTED AREA EVERY DAY AS NEEDED FOR RASH  Patient not taking: Reported on 06/21/2020   Chase Held, Chase Not Taking Active   Multiple Vitamins-Minerals (AIRBORNE GUMMIES PO) [id number] No Take 1 each by mouth daily. [provider] Taking Active   NONFORMULARY OR COMPOUNDED ITEM [id number] No Compression socks  #1   15-20 mm/hg  Dx edema  Patient not taking: Reported on 06/21/2020   Chase Held, Chase Not Taking Active   ondansetron (ZOFRAN-ODT) 4 MG disintegrating tablet [id number] No USE UP TO THREE TIMES A DAY AS  NEEDED FOR NAUSEA.  Patient not taking: Reported on 06/21/2020   Elayne Snare, MD Not Taking Active   polyethylene glycol powder (GLYCOLAX/MIRALAX) 17 GM/SCOOP powder [id number] No TAKE 17 GRAMS BY MOUTH 2 TIMES DAILY AS NEEDED. Chase Held, Chase Taking Active   pravastatin (PRAVACHOL) 20 MG tablet [id number] No Take 2 tablets (40 mg total) by mouth daily. Chase Held, Chase Taking Active            Med Note Cato Mulligan Jun 21, 2020  1:01 PM) Taking 1 QD  PROCTOFOAM [facility] rectal foam [id number] No APPLY FOAM THREE TIMES A DAY AS NEEDED FOR 7 DAYS  Patient not taking: Reported on 06/21/2020   Chase Held, Chase Not Taking Active   Semaglutide,0.25 or 0.5MG /DOS, (OZEMPIC, 0.25 OR 0.5 MG/DOSE,) 2 MG/1.5ML SOPN [id number] No Inject 0.5 mg into the skin once a week.  Patient not taking: Reported on 06/21/2020   [name] Kingdom, MD Not Taking Active             Med Note Merrilyn Puma, Lorine Bears Jun 21, 2020 12:46 PM) Insurance won't pay at all  terazosin (HYTRIN) 5 MG capsule [id number] No Take by mouth daily. [provider] Taking Active   triamcinolone cream (KENALOG) 0.1 % [id number] No Apply 1 application topically 2 (two) times daily.  Patient not taking: Reported on 06/21/2020   Chase Held, Chase Not Taking Active   venlafaxine XR (EFFEXOR-XR) 75 MG 24 hr capsule [id number] No TAKE 1 CAPSULE (75 MG TOTAL) BY MOUTH DAILY WITH BREAKFAST. Chase Held, Chase Taking Active           Patient Active Problem List   Diagnosis Date Noted  . Allergic drug reaction 05/24/2020  . 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/23/2019  . Hyperlipidemia 11/23/2019  . Bilateral hearing loss 11/23/2019  . Tick bite of back 11/23/2019  . Chronic pain syndrome 11/23/2019  . Chest discomfort 06/08/2019  . Benign neoplasm of brain ([place]) 05/24/2019  . Rash 05/24/2019  . Dry skin 05/09/2019  . Seasonal and perennial allergic rhinitis 05/09/2019  . Food intolerance 05/09/2019  . DDD (degenerative disc disease), cervical 04/26/2019  . DDD (degenerative disc disease), lumbar 04/26/2019  . Chronic pansinusitis 06/30/2018  . Deviated septum 05/31/2018  . Nasal turbinate hypertrophy 05/31/2018  . Insect bite 03/16/2018  . Central sleep apnea associated with atrial fibrillation ([place]) 03/12/2018  . Excessive daytime sleepiness 02/11/2018  . Sleep apnea with use of continuous positive airway pressure (CPAP) 02/11/2018  . 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 Nocturia more than twice per night 02/11/2018  . Bilateral leg and foot pain 03/02/2017  . Fibromyalgia 03/02/2017  . Localized swelling of both lower legs 03/02/2017  . Other chronic pain 03/02/2017  . Raynaud's disease 03/02/2017  . Raynaud's phenomenon 03/02/2017  . Pain in joint of right shoulder 02/21/2017  . DM (diabetes mellitus) type II  uncontrolled, periph vascular disorder ([place]) 01/02/2016  . Right shoulder pain 02/15/2015  . Rectal bleeding 02/12/2015  . Rectal ulcer with bleeding after hemorrhoid banding 02/11/2015  . Lower GI bleed   . Blood in stool, frank 02/10/2015  . Anemia 02/10/2015  . Acute renal insufficiency 02/10/2015  . Hemorrhoids 02/10/2015  . GI bleed 02/10/2015  . Acute GI bleeding   . Stroke ([place]) 01/26/2015  . Palpitations 01/26/2015  . Essential hypertension 01/26/2015  . Hyperlipidemia LDL goal <70  01/26/2015  . Cerebrovascular accident, old 11/16/2014  . Internal hemorrhoids 10/22/2014  . 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 OSA on CPAP 10/17/2014  . Persistent headaches 10/17/2014  . Subacute confusional state 10/17/2014  . Chronic cough 08/27/2014  . Sinus infection 06/30/2014  . Sinusitis, acute, maxillary 02/02/2014  . 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  . Prolapsed hemorrhoids 10/05/2013  . Hemangioma 02/12/2013  . Decreased hearing of both ears 02/12/2013  . Abnormality of gait 11/10/2012  . Hypersomnia, persistent 11/05/2012  . Unspecified sleep apnea 11/05/2012  . Pain in finger of right hand 10/06/2012  . Internal and external hemorrhoids without complication [id number]  . Varicose veins of lower extremities with other complications [id number]  . Acute upper respiratory infection 08/10/2012  . Bigeminy ? 03/22/2012  . Vertigo 03/19/2012  . Annual physical exam 11/26/2011  . Sleep apnea 11/03/2011  . Insomnia 08/05/2011  . Difficulty urinating   . Hemorrhoid 05/22/2011  . Dysuria 11/29/2010  . Arthralgia 07/25/2010  . Paresthesia 07/25/2010  . HTN (hypertension) 02/04/2010  . ANXIETY DEPRESSION 12/09/2007  . GERD 12/09/2007  . FATIGUE 12/09/2007  . 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 Chronic low back pain 07/20/2007  . SYMPTOM, HYPERSOMNIA NOS 02/19/2007  . Rhinitis, chronic 12/23/2006  . HEADACHE 12/23/2006  .  NEPHROLITHIASIS 08/13/2006    Conditions to be addressed/monitored per PCP order:  medication  Patient Care Plan: Medication Management    Problem Identified: Health Promotion or Disease Self-Management (General Plan of Care)     Goal: Self-Management Plan Developed   Note:   Evidence-based guidance:   Review biopsychosocial determinants of health screens.   Determine level of modifiable health risk.   Assess level of patient activation, level of readiness, importance and confidence to make changes.   Evoke change talk using open-ended questions, pros and cons, as well as looking forward.   Identify areas where behavior change may lead to improved health.   Partner with patient to develop a robust self-management plan that includes lifestyle factors, such as weight loss, exercise and healthy nutrition, as well as goals specific to disease risks.   Support patient and family/caregiver active participation in decision-making and self-management plan.   Implement additional goals and interventions based on identified risk factors to reduce health risk.   Facilitate advance care planning.   Review need for preventive screening based on age, sex, family history and health history.   Notes:    Task: Medication Management   Note:   Current Barriers:  . Unable to independently monitor therapeutic efficacy . Unable to achieve control of DM and HTN  . Patient suffered multiple mini-strokes and has aphasia, he is self-conscious about talking because of it . Patient has very low health literacy, has no idea what medicines are for or what disease states Chase in the body. Spent a majority of visit explaining using metaphors  Pharmacist Clinical Goal(s):  Marland Kitchen Over the next  7 days, patient will achieve adherence to monitoring guidelines and medication adherence to achieve therapeutic efficacy through collaboration with PharmD and provider.  o Patient will test BP  daily  Interventions: . Inter-disciplinary care team collaboration (see longitudinal plan of care) . Comprehensive medication review performed; medication list updated in electronic medical record  @RXCPDIABETES @ @RXCPHYPERTENSION @ @RXCPHYPERLIPIDEMIA @ @RXCPMENTALHEALTH @  Patient Goals/Self-Care Activities . Over the next 7 days, patient will:  - check blood pressure Daily, document, and provide at future appointments  Follow Up Plan: The care  management team will reach out to the patient again over the next 7 days.       Follow up:  Patient agrees to Care Plan and Follow-up.  Plan: The Managed Medicaid care management team will reach out to the patient again over the next 30 days.  Date/time of next scheduled Social Work care management/care coordination outreach:  07/26/20  Mickel Fuchs, [place], [place]  High Risk Managed Medicaid Team

## 2020-06-26 DIAGNOSIS — M5134 Other intervertebral disc degeneration, thoracic region: Secondary | ICD-10-CM | POA: Diagnosis not present

## 2020-06-26 DIAGNOSIS — M5030 Other cervical disc degeneration, unspecified cervical region: Secondary | ICD-10-CM | POA: Diagnosis not present

## 2020-06-26 DIAGNOSIS — G894 Chronic pain syndrome: Secondary | ICD-10-CM | POA: Diagnosis not present

## 2020-06-26 DIAGNOSIS — M5459 Other low back pain: Secondary | ICD-10-CM | POA: Diagnosis not present

## 2020-06-27 ENCOUNTER — Telehealth: Payer: Self-pay

## 2020-06-28 ENCOUNTER — Other Ambulatory Visit: Payer: Self-pay

## 2020-06-28 NOTE — Patient Outreach (Signed)
Jan 13th: BP:06/28/20:122/73 06/25/20: 126/73, 130/78 06/22/20: 119/66 Plan: At goal,  patient stable/ symptoms controlled  DM: 202 after eating.  06/22/20: 148 Plan: Will start testing before food Plan: Patient's DEXCOM wasn't approved per txt. Called Pharmacy and per "Data" (Person's name) the PCP PA went through, waiting on the insurance Plan: Ozempic and other DM Meds still require PA. [facility] called [facility] and it's too early to fill. Asked PCP to send in [place] to [facility] (Hasn't been filled in a year) and I'll call [facility] and see if I can get it approved/what is needed. Also, inquired that if this is approved should I tell patient to DC Sitagliptin.  F/U 7 Days with Patient

## 2020-06-29 ENCOUNTER — Other Ambulatory Visit: Payer: Self-pay | Admitting: Internal Medicine

## 2020-06-29 ENCOUNTER — Telehealth: Payer: Self-pay | Admitting: Internal Medicine

## 2020-06-29 ENCOUNTER — Telehealth: Payer: Self-pay | Admitting: Family Medicine

## 2020-06-29 MED ORDER — OZEMPIC (0.25 OR 0.5 MG/DOSE) 2 MG/1.5ML ~~LOC~~ SOPN
0.5000 mg | PEN_INJECTOR | SUBCUTANEOUS | 3 refills | Status: DC
Start: 2020-06-29 — End: 2020-06-29

## 2020-06-29 MED ORDER — OZEMPIC (0.25 OR 0.5 MG/DOSE) 2 MG/1.5ML ~~LOC~~ SOPN
0.5000 mg | PEN_INJECTOR | SUBCUTANEOUS | 3 refills | Status: DC
Start: 2020-06-29 — End: 2020-07-03

## 2020-06-29 NOTE — Telephone Encounter (Signed)
Called and spoke with insurance rep requesting they re fax the PA paperwork. Will complete and fax back when completed.

## 2020-06-29 NOTE — Telephone Encounter (Signed)
Insurance called regarding patient's Dexcom. They stated they needed prior authorization and needed to speak with Gherghe's assistant.   Can speak to anyone on this number regarding this. Please call: [phone]

## 2020-06-29 NOTE — Telephone Encounter (Signed)
Patient dropped off form to be signed  Put into lowne bin  Would like for it too be faxed and emailed to number & email on paper

## 2020-07-02 ENCOUNTER — Telehealth: Payer: BC Managed Care – PPO | Admitting: Family Medicine

## 2020-07-02 ENCOUNTER — Encounter: Payer: Self-pay | Admitting: Family Medicine

## 2020-07-03 ENCOUNTER — Ambulatory Visit (INDEPENDENT_AMBULATORY_CARE_PROVIDER_SITE_OTHER): Payer: BC Managed Care – PPO | Admitting: Family Medicine

## 2020-07-03 ENCOUNTER — Encounter: Payer: Self-pay | Admitting: Family Medicine

## 2020-07-03 ENCOUNTER — Other Ambulatory Visit: Payer: Self-pay | Admitting: Internal Medicine

## 2020-07-03 ENCOUNTER — Ambulatory Visit (HOSPITAL_BASED_OUTPATIENT_CLINIC_OR_DEPARTMENT_OTHER)
Admission: RE | Admit: 2020-07-03 | Discharge: 2020-07-03 | Disposition: A | Discharge status: Home / Self Care | Payer: BC Managed Care – PPO | Source: Ambulatory Visit | Attending: Family Medicine | Admitting: Family Medicine

## 2020-07-03 ENCOUNTER — Other Ambulatory Visit: Payer: Self-pay

## 2020-07-03 VITALS — BP 98/78 | HR 88 | Temp 98.3°F | Resp 18 | Ht 69.0 in | Wt 188.6 lb

## 2020-07-03 DIAGNOSIS — Z01818 Encounter for other preprocedural examination: Secondary | ICD-10-CM

## 2020-07-03 DIAGNOSIS — E1165 Type 2 diabetes mellitus with hyperglycemia: Secondary | ICD-10-CM | POA: Diagnosis not present

## 2020-07-03 DIAGNOSIS — E1151 Type 2 diabetes mellitus with diabetic peripheral angiopathy without gangrene: Secondary | ICD-10-CM | POA: Diagnosis not present

## 2020-07-03 DIAGNOSIS — R600 Localized edema: Secondary | ICD-10-CM

## 2020-07-03 DIAGNOSIS — I959 Hypotension, unspecified: Secondary | ICD-10-CM | POA: Diagnosis not present

## 2020-07-03 DIAGNOSIS — R0602 Shortness of breath: Secondary | ICD-10-CM | POA: Diagnosis not present

## 2020-07-03 DIAGNOSIS — J302 Other seasonal allergic rhinitis: Secondary | ICD-10-CM

## 2020-07-03 DIAGNOSIS — R5383 Other fatigue: Secondary | ICD-10-CM | POA: Diagnosis not present

## 2020-07-03 DIAGNOSIS — IMO0002 Reserved for concepts with insufficient information to code with codable children: Secondary | ICD-10-CM

## 2020-07-03 DIAGNOSIS — J3089 Other allergic rhinitis: Secondary | ICD-10-CM | POA: Diagnosis not present

## 2020-07-03 DIAGNOSIS — I10 Essential (primary) hypertension: Secondary | ICD-10-CM

## 2020-07-03 LAB — COMPREHENSIVE METABOLIC PANEL
ALT: 20 U/L (ref 0–53)
AST: 21 U/L (ref 0–37)
Albumin: 4.4 g/dL (ref 3.5–5.2)
Alkaline Phosphatase: 75 U/L (ref 39–117)
BUN: 10 mg/dL (ref 6–23)
CO2: 29 mEq/L (ref 19–32)
Calcium: 9.3 mg/dL (ref 8.4–10.5)
Chloride: 103 mEq/L (ref 96–112)
Creatinine, Ser: 0.92 mg/dL (ref 0.40–1.50)
GFR: 90.83 mL/min (ref >60.00)
Glucose, Bld: 217 mg/dL — ABNORMAL HIGH (ref 70–99)
Potassium: 3.3 mEq/L — ABNORMAL LOW (ref 3.5–5.1)
Sodium: 139 mEq/L (ref 135–145)
Total Bilirubin: 0.5 mg/dL (ref 0.2–1.2)
Total Protein: 6.4 g/dL (ref 6.0–8.3)

## 2020-07-03 LAB — CBC WITH DIFFERENTIAL/PLATELET
Basophils Absolute: 0 10*3/uL (ref 0.0–0.1)
Basophils Relative: 0.4 % (ref 0.0–3.0)
Eosinophils Absolute: 0.2 10*3/uL (ref 0.0–0.7)
Eosinophils Relative: 3.2 % (ref 0.0–5.0)
HCT: 40.8 % (ref 39.0–52.0)
Hemoglobin: 13.8 g/dL (ref 13.0–17.0)
Lymphocytes Relative: 23.1 % (ref 12.0–46.0)
Lymphs Abs: 1.7 10*3/uL (ref 0.7–4.0)
MCHC: 33.8 g/dL (ref 30.0–36.0)
MCV: 87 fl (ref 78.0–100.0)
Monocytes Absolute: 0.5 10*3/uL (ref 0.1–1.0)
Monocytes Relative: 7.5 % (ref 3.0–12.0)
Neutro Abs: 4.8 10*3/uL (ref 1.4–7.7)
Neutrophils Relative %: 65.8 % (ref 43.0–77.0)
Platelets: 287 10*3/uL (ref 150.0–400.0)
RBC: 4.69 Mil/uL (ref 4.22–5.81)
RDW: 13 % (ref 11.5–15.5)
WBC: 7.2 10*3/uL (ref 4.0–10.5)

## 2020-07-03 LAB — VITAMIN B12: Vitamin B-12: 389 pg/mL (ref 211–911)

## 2020-07-03 LAB — TSH: TSH: 4.4 u[IU]/mL (ref 0.35–4.50)

## 2020-07-03 MED ORDER — AZELASTINE HCL 0.15 % NA SOLN
NASAL | 5 refills | Status: DC
Start: 2020-07-03 — End: 2020-08-06

## 2020-07-03 MED ORDER — FUROSEMIDE 20 MG PO TABS: 20.0000 mg | ORAL_TABLET | Freq: Every day | ORAL | 3 refills | Status: DC

## 2020-07-03 NOTE — Telephone Encounter (Signed)
Surgical clearance faxed  

## 2020-07-03 NOTE — Telephone Encounter (Signed)
Pt has appt today

## 2020-07-03 NOTE — Progress Notes (Signed)
Patient ID: Chase [name], male    DOB: [birth year]  Age: 60 y.o. MRN: [id number]    Subjective:  Subjective  HPI Chase [name] presents for surgical clearance for tooth extraction under local anesthesia  He has never had trouble with anesthesia before.    He is c/o of swelling in low ext that is worse -- no chest pain but some dizziness that is ongoing with the pt , not new   He also c/o ears feeling full and he ran out of his nasal spray.   Review of Systems  Constitutional: Negative.   HENT: Positive for ear pain. Negative for congestion, hearing loss, nosebleeds, postnasal drip, rhinorrhea, sinus pressure, sneezing and tinnitus.   Eyes: Negative for photophobia, discharge, itching and visual disturbance.  Respiratory: Negative.   Cardiovascular: Positive for palpitations and leg swelling.  Gastrointestinal: Negative for abdominal distention, abdominal pain, anal bleeding, blood in stool and constipation.  Endocrine: Negative.   Genitourinary: Negative.   Musculoskeletal: Negative.   Skin: Negative.   Allergic/Immunologic: Negative.   Neurological: Positive for dizziness. Negative for weakness, light-headedness, numbness and headaches.  Psychiatric/Behavioral: Negative for agitation, confusion, decreased concentration, dysphoric mood, sleep disturbance and suicidal ideas. The patient is not nervous/anxious.     History Past Medical History:  Diagnosis Date  . Allergy   . Arthritis   . Blood in stool   . Brain cyst   . Chest pain   . Constipation   . Diabetes mellitus without complication ([place])   . Difficulty urinating   . GERD (gastroesophageal reflux disease)   . Headache(784.0)   . Hearing loss   . Hyperlipidemia   . Hypertension   . Leg swelling   . Nasal congestion   . PONV (postoperative nausea and vomiting)   . Rectal pain   . Rectal ulcer with bleeding after hemorrhoid banding 02/11/2015  . Sleep apnea   . Stroke ([place])   . Stroke ([place])   . Thrombosed  hemorrhoids   . Trouble swallowing     He has a past surgical history that includes Cataract extraction; Inner ear surgery; Eye surgery ([place]); Inner ear surgery (1992); surgery - right arm; and Flexible sigmoidoscopy (N/A, 02/11/2015).   His family history includes Breast cancer (age of onset: 76) in his mother; Heart attack in his father; Heart disease in his father; Huntington's disease in his mother; Hypertension in his brother and father; Stroke in his father.He reports that he has never smoked. He has never used smokeless tobacco. He reports that he does not drink alcohol and does not use drugs.  Current Outpatient Medications on File Prior to Visit  Medication Sig Dispense Refill  . allopurinol (ZYLOPRIM) 100 MG tablet Take 1 tablet (100 mg total) by mouth daily. 90 tablet 1  . budesonide (PULMICORT) 0.5 MG/2ML nebulizer solution MIX WITH 240 CC SALINE IN THE NEILMED BOTTLE AS DIRECTED AND RINSE SINUSES TWICE A DAY. 360 mL 1  . clotrimazole-betamethasone (LOTRISONE) cream APPLY TO AFFECTED AREA TWICE A DAY 30 g 0  . Continuous Blood Gluc Transmit (DEXCOM G6 TRANSMITTER) MISC 1 Device by Does not apply route every 3 (three) months. 1 each 3  . cyclobenzaprine (FLEXERIL) 10 MG tablet TAKE 1 TABLET BY MOUTH AT BEDTIME AS NEEDED FOR MUSCLE SPASMS 30 tablet 1  . cyproheptadine (PERIACTIN) 4 MG tablet TAKE 1 TABLET (4 MG TOTAL) BY MOUTH AT BEDTIME. 90 tablet 1  . empagliflozin (JARDIANCE) 25 MG TABS tablet Take 25 mg by  mouth daily. 30 tablet 6  . EPINEPHrine 0.3 mg/0.3 mL IJ SOAJ injection Use as directed for severe allergic reaction. 2 each 3  . famotidine (PEPCID) 40 MG/5ML suspension TAKE 2.5 MLS (20 MG TOTAL) BY MOUTH DAILY. 100 mL 2  . FIBER SELECT GUMMIES PO Take 1 tablet by mouth daily.    . finasteride (PROSCAR) 5 MG tablet Take 5 mg by mouth daily.    . fluticasone (CUTIVATE) 0.05 % cream Apply topically 2 (two) times daily. 30 g 0  . gabapentin (NEURONTIN) 100 MG capsule  TAKE 1 CAPSULE BY MOUTH THREE TIMES A DAY 90 capsule 3  . GuaiFENesin 200 MG/10ML SOLN     . HYDROcodone-acetaminophen (HYCET) 7.5-325 mg/15 ml solution TAKE 15 ML EVERY 6 HOURS AS NEEDED FOR PAIN 473 mL 0  . hydrOXYzine (ATARAX/VISTARIL) 25 MG tablet TAKE 1 TABLET BY MOUTH EVERY 8 HOURS AS NEEDED FOR ANXIETY OR ITCHING 270 tablet 1  . metFORMIN (GLUCOPHAGE) 500 MG tablet Take 1 tablet (500 mg total) by mouth 2 (two) times daily with a meal. 60 tablet 3  . metoprolol tartrate (LOPRESSOR) 25 MG tablet TAKE 1 TABLET BY MOUTH TWICE A DAY 180 tablet 1  . Multiple Vitamins-Minerals (AIRBORNE GUMMIES PO) Take 1 each by mouth daily.    . polyethylene glycol powder (GLYCOLAX/MIRALAX) 17 GM/SCOOP powder TAKE 17 GRAMS BY MOUTH 2 TIMES DAILY AS NEEDED. 527 g 5  . pravastatin (PRAVACHOL) 40 MG tablet Take 1 tablet (40 mg total) by mouth daily. 90 tablet 3  . terazosin (HYTRIN) 5 MG capsule Take by mouth daily.  3  . venlafaxine XR (EFFEXOR-XR) 75 MG 24 hr capsule TAKE 1 CAPSULE (75 MG TOTAL) BY MOUTH DAILY WITH BREAKFAST. 90 capsule 1  . Continuous Blood Gluc Sensor (DEXCOM G6 SENSOR) MISC Inject 1 Device into the skin continuous for 10 days. 9 each 3  . PROCTOFOAM HC rectal foam APPLY FOAM THREE TIMES A DAY AS NEEDED FOR 7 DAYS (Patient not taking: No sig reported) 10 g 2   No current facility-administered medications on file prior to visit.   ekg-- nsr    Objective:  Objective  Physical Exam Vitals and nursing note reviewed.  Constitutional:      General: He is sleeping. Vital signs are normal.     Appearance: He is well-developed and well-nourished.  HENT:     Head: Normocephalic and atraumatic.     Right Ear: Decreased hearing noted. A middle ear effusion is present.     Left Ear: Decreased hearing noted. A middle ear effusion is present.     Mouth/Throat:     Mouth: Oropharynx is clear and moist.  Eyes:     Extraocular Movements: EOM normal.     Pupils: Pupils are equal, round, and reactive  to light.  Neck:     Thyroid: No thyromegaly.  Cardiovascular:     Rate and Rhythm: Normal rate and regular rhythm.     Heart sounds: No murmur heard.   Pulmonary:     Effort: Pulmonary effort is normal. No respiratory distress.     Breath sounds: Normal breath sounds. No wheezing or rales.  Chest:     Chest wall: No tenderness.  Musculoskeletal:        General: Swelling present. No tenderness.     Cervical back: Normal range of motion and neck supple.     Right lower leg: 2+ Pitting Edema present.     Left lower leg: 1+ Pitting Edema present.  Skin:    General: Skin is warm and dry.  Neurological:     Mental Status: He is oriented to person, place, and time.  Psychiatric:        Mood and Affect: Mood and affect normal.        Behavior: Behavior normal.        Thought Content: Thought content normal.        Judgment: Judgment normal.    BP 98/78   Pulse 88   Temp 98.3 F (36.8 C) (Oral)   Resp 18   Ht 5\' 9"  (1.753 m)   Wt 188 lb 9.6 oz (85.5 kg)   SpO2 98%   BMI 27.85 kg/m  Wt Readings from Last 3 Encounters:  07/03/20 188 lb 9.6 oz (85.5 kg)  06/12/20 187 lb (84.8 kg)  05/24/20 183 lb 9.6 oz (83.3 kg)   Repeat bp 100/78  Lab Results  Component Value Date   WBC 7.2 07/03/2020   HGB 13.8 07/03/2020   HCT 40.8 07/03/2020   PLT 287.0 07/03/2020   GLUCOSE 217 (H) 07/03/2020   CHOL 145 05/30/2020   TRIG 189.0 (H) 05/30/2020   HDL 31.30 (L) 05/30/2020   LDLDIRECT 75.0 05/24/2020   LDLCALC 76 05/30/2020   ALT 20 07/03/2020   AST 21 07/03/2020   NA 139 07/03/2020   K 3.3 (L) 07/03/2020   CL 103 07/03/2020   CREATININE 0.92 07/03/2020   BUN 10 07/03/2020   CO2 29 07/03/2020   TSH 4.40 07/03/2020   PSA 1.01 10/03/2014   INR 1.11 02/10/2015   HGBA1C 8.2 (H) 05/30/2020   MICROALBUR <0.7 05/24/2020    MR Lumbar Spine W Wo Contrast  Result Date: 05/16/2020 CLINICAL DATA:  Intradural, extramedullary spinal tumor. EXAM: MRI LUMBAR SPINE WITHOUT AND WITH  CONTRAST TECHNIQUE: Multiplanar and multiecho pulse sequences of the lumbar spine were obtained without and with intravenous contrast. CONTRAST:  82mL MULTIHANCE GADOBENATE DIMEGLUMINE 529 MG/ML IV SOLN COMPARISON:  MRI of the lumbar spine January 07, 2020. FINDINGS: Segmentation:  Standard. Alignment:  Physiologic. Vertebrae:  No fracture, evidence of discitis, or bone lesion. Conus medullaris and cauda equina: Conus extends to the L2 level. Conus appear normal. No significant change in size of the intradural, extramedullary lesion in the posterior aspect of the thecal sac at the L2 level, measuring 10 mm. Postcontrast images show intense contrast enhancement of this lesion without evidence of additional focus of abnormal contrast enhancement within the thecal sac. The lesion causes mild anterior displacement of the conus. Paraspinal and other soft tissues: Negative. Disc levels: No significant disc bulge or herniation, spinal canal or neural foraminal stenosis at any level. IMPRESSION: 1. No significant change in size of the intradural, extramedullary lesion in the posterior aspect of the thecal sac at the L2 level, measuring 10 mm, and demonstrating intense contrast enhancement. Differential diagnosis remains the same and includes nerve sheath tumor, myxopapillary ependymoma and metastasis. 2. No significant degenerative changes of the lumbar spine. Electronically Signed   By: Baldemar Lenis M.D.   On: 05/16/2020 09:17     Assessment & Plan:  Plan  I have discontinued Chase Crandall "Jeff"'s ondansetron, diphenhydrAMINE, mometasone, gentamicin cream, Bayer Microlet Lancets, NONFORMULARY OR COMPOUNDED ITEM, betamethasone dipropionate, FreeStyle Libre 14 Day Reader, triamcinolone, Advil Dual Action, Contour Next Test, levocetirizine, and amLODipine. I am also having him start on furosemide. Additionally, I am having him maintain his FIBER SELECT GUMMIES PO, guaiFENesin, Multiple Vitamins-Minerals  (AIRBORNE GUMMIES PO),  terazosin, finasteride, EPINEPHrine, polyethylene glycol powder, HYDROcodone-acetaminophen, cyproheptadine, Jardiance, famotidine, budesonide, allopurinol, gabapentin, venlafaxine XR, metFORMIN, fluticasone, Proctofoam HC, hydrOXYzine, clotrimazole-betamethasone, cyclobenzaprine, Dexcom G6 Sensor, Dexcom G6 Transmitter, metoprolol tartrate, pravastatin, and Azelastine HCl.  Meds ordered this encounter  Medications  . furosemide (LASIX) 20 MG tablet    Sig: Take 1 tablet (20 mg total) by mouth daily.    Dispense:  30 tablet    Refill:  3  . Azelastine HCl 0.15 % SOLN    Sig: Two sprays each nostril 1-2 times a day as needed.    Dispense:  30 mL    Refill:  5    Problem List Items Addressed This Visit      Unprioritized   DM (diabetes mellitus) type II uncontrolled, periph vascular disorder ([place])    Per endo Needs f/u      Relevant Medications   furosemide (LASIX) 20 MG tablet   Essential hypertension    Running low Hold norvasc F/u 2 weeks      Relevant Medications   furosemide (LASIX) 20 MG tablet   Hypotension    Stop norvasc F/u 2 weeks or sooner prn       Relevant Medications   furosemide (LASIX) 20 MG tablet   Other Relevant Orders   EKG 12-Lead (Completed)   Lower extremity edema   Relevant Medications   furosemide (LASIX) 20 MG tablet   Other Relevant Orders   Comprehensive metabolic panel (Completed)   CBC with Differential/Platelet (Completed)   TSH (Completed)   Vitamin B12 (Completed)   DG Chest 2 View (Completed)   Other fatigue   Relevant Medications   furosemide (LASIX) 20 MG tablet   Other Relevant Orders   CBC with Differential/Platelet (Completed)   TSH (Completed)   Vitamin B12 (Completed)   DG Chest 2 View (Completed)   Preoperative clearance - Primary    Pt is cleared to have tooth extracted       Seasonal and perennial allergic rhinitis    Refill astelin nose spray con't allergy meds meds      SOB (shortness  of breath)    Check labs and cxr  ekg normal          Follow-up: Return in about 2 weeks (around 07/17/2020) for hypertension.  Ann Held, DO

## 2020-07-03 NOTE — Patient Instructions (Signed)

## 2020-07-04 DIAGNOSIS — R5383 Other fatigue: Secondary | ICD-10-CM

## 2020-07-04 DIAGNOSIS — Z01818 Encounter for other preprocedural examination: Secondary | ICD-10-CM

## 2020-07-04 DIAGNOSIS — R0602 Shortness of breath: Secondary | ICD-10-CM

## 2020-07-04 DIAGNOSIS — I959 Hypotension, unspecified: Secondary | ICD-10-CM

## 2020-07-04 DIAGNOSIS — R600 Localized edema: Secondary | ICD-10-CM | POA: Insufficient documentation

## 2020-07-04 HISTORY — DX: Hypotension, unspecified: I95.9

## 2020-07-04 HISTORY — DX: Other fatigue: R53.83

## 2020-07-04 HISTORY — DX: Localized edema: R60.0

## 2020-07-04 HISTORY — DX: Shortness of breath: R06.02

## 2020-07-04 HISTORY — DX: Encounter for other preprocedural examination: Z01.818

## 2020-07-04 NOTE — Assessment & Plan Note (Signed)
Pt is cleared to have tooth extracted

## 2020-07-04 NOTE — Assessment & Plan Note (Signed)
Stop norvasc F/u 2 weeks or sooner prn

## 2020-07-04 NOTE — Assessment & Plan Note (Signed)
Refill astelin nose spray con't allergy meds meds

## 2020-07-04 NOTE — Assessment & Plan Note (Signed)
Per endo Needs f/u

## 2020-07-04 NOTE — Assessment & Plan Note (Signed)
Check labs and cxr  ekg normal

## 2020-07-04 NOTE — Assessment & Plan Note (Signed)
Running low Hold norvasc F/u 2 weeks

## 2020-07-05 ENCOUNTER — Telehealth: Payer: Self-pay | Admitting: *Deleted

## 2020-07-05 ENCOUNTER — Other Ambulatory Visit: Payer: Self-pay | Admitting: Internal Medicine

## 2020-07-05 ENCOUNTER — Encounter: Payer: Self-pay | Admitting: Internal Medicine

## 2020-07-05 ENCOUNTER — Other Ambulatory Visit: Payer: Self-pay

## 2020-07-05 MED ORDER — VICTOZA 18 MG/3ML ~~LOC~~ SOPN: PEN_INJECTOR | SUBCUTANEOUS | 3 refills | Status: DC

## 2020-07-05 NOTE — Telephone Encounter (Signed)
[employer] called again regarding prior authorization for Dexcom G6 sensor and meter. Insurance denied receiver but still needs the sensor and meter. She was wondering if has been signed yet.

## 2020-07-05 NOTE — Patient Outreach (Signed)
Medicaid Managed Care    Pharmacy Note  07/05/2020 Name: Chase [name] MRN: [id number] DOB: 1960-10-29  Chase [name] is a 60 y.o. year old male who is a primary care Chase [name] of Ann Held, DO. The [facility] Managed Care Coordination team was consulted for assistance with disease management and care coordination needs.    Engaged with Chase [name] Engaged with Chase [name] by telephone for follow up visit in response to referral for case management and/or care coordination services.  Chase [name] was given information about Managed Medicaid Care Coordination team services today. Chase [name] agreed to services and verbal consent obtained.   Objective:  Lab Results  Component Value Date   CREATININE 0.92 07/03/2020   CREATININE 0.91 05/30/2020   CREATININE 0.92 05/24/2020    Lab Results  Component Value Date   HGBA1C 8.2 (H) 05/30/2020       Component Value Date/Time   CHOL 145 05/30/2020 1504   TRIG 189.0 (H) 05/30/2020 1504   HDL 31.30 (L) 05/30/2020 1504   CHOLHDL 5 05/30/2020 1504   VLDL 37.8 05/30/2020 1504   LDLCALC 76 05/30/2020 1504   LDLDIRECT 75.0 05/24/2020 1052    Other: (TSH, CBC, Vit D, etc.)  Clinical ASCVD: No  The ASCVD Risk score Mikey Bussing DC Jr., et al., 2013) failed to calculate for the following reasons:   The Chase [name] has a prior MI or stroke diagnosis    Other: (CHADS2VASc if Afib, PHQ9 if depression, MMRC or CAT for COPD, ACT, DEXA)  BP Readings from Last 3 Encounters:  07/03/20 98/78  06/12/20 128/88  05/24/20 114/72    Assessment/Interventions: Review of Chase [name] past medical history, allergies, medications, health status, including review of consultants reports, laboratory and other test data, was performed as part of comprehensive evaluation and provision of chronic care management services.    DM2 -Dx 2014 Jardiance 25mg  Januvia 100mg  (Pharmacy having trouble running medication) Metformin 500mg  (New trial) Ozempic Dexcom: Average is  193 Tried/Failed: Glipizide (Rash), Metformin (GI). Can't take ER because hard for him to swallow December 2021 Plan: Called pt's pharmacy, they gave me a number [phone]) to call but it was unhelpful. Will coordinate with Chase [name] next week to determine plan  B Jan 2022: Ozempic NF, will ask Endo to change to Victoza. Januvia and Jardiance PA's went through. Will also ask Endo about [place] if GLP approved. Will focus on DEXCOM approval at f/u   Lipids Pravastatin 40mg   -Only taking 1QD, has trouble swallowing Plan: At goal,  Chase [name] stable/ symptoms controlled   Cardio Metoprolol Tartrate 25mg  BID Allopurinol 06/22/19 BP: 165/80  07/05/20: 128/78, 122/73 Tried/Failed: Amlodipine 5mg  (Hypotension) Jan 2022: PCP stopped Amlodipine due to hypotension Plan: At goal,  Chase [name] stable/ symptoms controlled   Kidney Stones/Gout -Unsure when last Gout attack Allopurinol 100mg  Plan: At goal,  Chase [name] stable/ symptoms controlled   BPH -Painful urination and frequency Terazosin 5mg  Finasteride 5mg  Plan: At goal,  Chase [name] stable/ symptoms controlled   Pain Pain scale: With meds:4/10       Without: 7/10 Gabapentin TID (Does work) Venlafaxine XR 75mg  Cyclobenzaprine 10mg  HS Plan: At goal,  Chase [name] stable/ symptoms controlled   Stool Softener PEG 17g/scoop PRN Plan: At goal,  Chase [name] stable/ symptoms controlled   GERD Famotidine 40mg /65ml Plan: At goal,  Chase [name] stable/ symptoms controlled    Depression Depression screen [facility] 2/9 10/21/2018 08/04/2017 02/19/2017  Decreased Interest 0 (No Data) 1  Down, Depressed, Hopeless 1 2 2   PHQ - 2 Score  1 2 3   Altered sleeping 3 1 1   Tired, decreased energy 1 2  2   Change in appetite 0 2 2  Feeling bad or failure about yourself  0 3 2  Trouble concentrating 1 3 3   Moving slowly or fidgety/restless 1 2 1   Suicidal thoughts 0 0 1  PHQ-9 Score 7 15 15   Difficult doing work/chores Somewhat difficult - -  Some  recent data might be hidden   Venlafaxine XR 75mg  Hydroxyzine 25mg  TID (Taking every day) -Chase [name] states he's content and therapy and doesn't want to change things Plan: At goal,  Chase [name] stable/ symptoms controlled   SDOH (Social Determinants of Health) assessments and interventions performed:    Care Plan Care Plan : Medication Management  Updates made by Lane Hacker, [place] since 07/05/2020 12:00 AM    Problem: Health Promotion or Disease Self-Management (General Plan of Care)     Goal: Self-Management Plan Developed   Note:   Current Barriers:  . Unable to independently afford treatment regimen . Unable to independently monitor therapeutic efficacy o Chase [name]'s Ozempic, Januvia, and Jardiance aren't covered (Was able to get Januvia and Jardiance covered today)  Pharmacist Clinical Goal(s):  Marland Kitchen Over the next 20 days, Chase [name] will verbalize ability to afford treatment regimen . achieve adherence to monitoring guidelines and medication adherence to achieve therapeutic efficacy . contact provider office for questions/concerns as evidenced notation of same in electronic health record through collaboration with PharmD and provider.  .   Interventions: . Inter-disciplinary care team collaboration (see longitudinal plan of care) . Comprehensive medication review performed; medication list updated in electronic medical record  @RXCPDIABETES @ @RXCPHYPERTENSION @ @RXCPHYPERLIPIDEMIA @  Chase [name] Goals/Self-Care Activities . Over the next 20 days, Chase [name] will:  - take medications as prescribed check blood pressure and sugars, document, and provide at future appointments  Follow Up Plan: The care management team will reach out to the Chase [name] again over the next 20 days.       Conditions to be addressed/monitored: HTN, HLD, Hypertriglyceridemia and DM  Chase [name] Care Plan: Medication Management    Problem Identified: Health Promotion or Disease Self-Management (General Plan of  Care)     Goal: Self-Management Plan Developed   Note:   Current Barriers:  . Unable to independently afford treatment regimen . Unable to independently monitor therapeutic efficacy o Chase [name]'s Ozempic, Januvia, and Jardiance aren't covered (Was able to get Januvia and Jardiance covered today)  Pharmacist Clinical Goal(s):  Marland Kitchen Over the next 20 days, Chase [name] will verbalize ability to afford treatment regimen . achieve adherence to monitoring guidelines and medication adherence to achieve therapeutic efficacy . contact provider office for questions/concerns as evidenced notation of same in electronic health record through collaboration with PharmD and provider.  .   Interventions: . Inter-disciplinary care team collaboration (see longitudinal plan of care) . Comprehensive medication review performed; medication list updated in electronic medical record  @RXCPDIABETES @ @RXCPHYPERTENSION @ @RXCPHYPERLIPIDEMIA @  Chase [name] Goals/Self-Care Activities . Over the next 20 days, Chase [name] will:  - take medications as prescribed check blood pressure and sugars, document, and provide at future appointments  Follow Up Plan: The care management team will reach out to the Chase [name] again over the next 20 days.     Task: Medication Management   Note:   Current Barriers:  . Unable to independently monitor therapeutic efficacy . Unable to achieve control of DM and HTN  . Chase [name] suffered multiple mini-strokes and has aphasia, he is self-conscious about talking because of it . Chase [name]  has very low health literacy, has no idea what medicines are for or what disease states do in the body. Spent a majority of visit explaining using metaphors  Pharmacist Clinical Goal(s):  Marland Kitchen Over the next  7 days, Chase [name] will achieve adherence to monitoring guidelines and medication adherence to achieve therapeutic efficacy through collaboration with PharmD and provider.  o Chase [name] will test BP  daily  Interventions: . Inter-disciplinary care team collaboration (see longitudinal plan of care) . Comprehensive medication review performed; medication list updated in electronic medical record  @RXCPDIABETES @ @RXCPHYPERTENSION @ @RXCPHYPERLIPIDEMIA @ @RXCPMENTALHEALTH @  Chase [name] Goals/Self-Care Activities . Over the next 7 days, Chase [name] will:  - check blood pressure Daily, document, and provide at future appointments  Follow Up Plan: The care management team will reach out to the Chase [name] again over the next 7 days.       Medication Assistance: Application for Ozempic medication assistance program in process. Anticipated assistance start date Soon once we change from [place] (NF) to Victoza. See plan of care above for additional detail.   Follow up: Agree/   Plan: The care management team will reach out to the Chase [name] again over the next 20 days.   [place] Constable, Pharm.D., Managed Medicaid Pharmacist - [phone]

## 2020-07-05 NOTE — Patient Instructions (Signed)
Visit Information  [name] was given information about Medicaid Managed Care team care coordination services as a part of their [place] Medicaid benefit. Chase [name] verbally consented to engagement with the [facility] Managed Care team.   For questions related to your [facility], please call: [phone] or visit the homepage here: [email or web address]  If you would like to schedule transportation through your [facility], please call the following number at least 2 days in advance of your appointment: [phone]  Chase [name] - following are the goals we discussed in your visit today:  Goals Addressed            This Visit's Progress   . Self Monitoring       Timeframe:  Short-Term Goal Priority:  High Start Date:              07/05/20               Expected End Date:                       Follow Up Date 07/20/20   - call for medicine refill 2 or 3 days before it runs out - keep a list of all the medicines I take; vitamins and herbals too    Why is this important?   . These steps will help you keep on track with your medicines.   Notes:  -Patient hasn't tested sugars or BP in 7 days. Patient wants to get back to daily testing       Please see education materials related to DM provided as print materials.   Patient verbalizes understanding of instructions provided today.   The Managed Medicaid care management team will reach out to the patient again over the next 20 days.   Lane Hacker, [facility]  Following is a copy of your plan of care:  Patient Care Plan: Medication Management    Problem Identified: Health Promotion or Disease Self-Management (General Plan of Care)     Goal: Self-Management Plan Developed   Note:   Current Barriers:  . Unable to independently afford treatment regimen . Unable to independently monitor therapeutic  efficacy o Patient's Ozempic, Januvia, and Jardiance aren't covered (Was able to get Januvia and Jardiance covered today)  Pharmacist Clinical Goal(s):  Marland Kitchen Over the next 20 days, patient will verbalize ability to afford treatment regimen . achieve adherence to monitoring guidelines and medication adherence to achieve therapeutic efficacy . contact provider office for questions/concerns as evidenced notation of same in electronic health record through collaboration with PharmD and provider.  .   Interventions: . Inter-disciplinary care team collaboration (see longitudinal plan of care) . Comprehensive medication review performed; medication list updated in electronic medical record  @RXCPDIABETES @ @RXCPHYPERTENSION @ @RXCPHYPERLIPIDEMIA @  Patient Goals/Self-Care Activities . Over the next 20 days, patient will:  - take medications as prescribed check blood pressure and sugars, document, and provide at future appointments  Follow Up Plan: The care management team will reach out to the patient again over the next 20 days.     Task: Medication Management   Note:   Current Barriers:  . Unable to independently monitor therapeutic efficacy . Unable to achieve control of DM and HTN  . Patient suffered multiple mini-strokes and has aphasia, he is self-conscious about talking because of it . Patient has very low health literacy, has no idea what medicines are for or what disease states  do in the body. Spent a majority of visit explaining using metaphors  Pharmacist Clinical Goal(s):  Marland Kitchen Over the next  7 days, patient will achieve adherence to monitoring guidelines and medication adherence to achieve therapeutic efficacy through collaboration with PharmD and provider.  o Patient will test BP daily  Interventions: . Inter-disciplinary care team collaboration (see longitudinal plan of care) . Comprehensive medication review performed; medication list updated in electronic medical  record  @RXCPDIABETES @ @RXCPHYPERTENSION @ @RXCPHYPERLIPIDEMIA @ @RXCPMENTALHEALTH @  Patient Goals/Self-Care Activities . Over the next 7 days, patient will:  - check blood pressure Daily, document, and provide at future appointments  Follow Up Plan: The care management team will reach out to the patient again over the next 7 days.

## 2020-07-05 NOTE — Telephone Encounter (Signed)
T, Do you know what she means? C

## 2020-07-05 NOTE — Telephone Encounter (Signed)
Called to advise Prior auth paperwork submitted and currently awaiting a determination.

## 2020-07-08 ENCOUNTER — Other Ambulatory Visit: Payer: Self-pay | Admitting: Internal Medicine

## 2020-07-08 MED ORDER — INSULIN PEN NEEDLE 32G X 4 MM MISC: 3 refills | Status: DC

## 2020-07-09 NOTE — Telephone Encounter (Signed)
Do you remember what this was regarding?

## 2020-07-09 NOTE — Telephone Encounter (Signed)
No idea. sorry

## 2020-07-14 ENCOUNTER — Encounter: Payer: Self-pay | Admitting: Family Medicine

## 2020-07-15 ENCOUNTER — Other Ambulatory Visit: Payer: Self-pay | Admitting: Family Medicine

## 2020-07-15 ENCOUNTER — Other Ambulatory Visit: Payer: Self-pay | Admitting: Allergy & Immunology

## 2020-07-15 DIAGNOSIS — T7840XA Allergy, unspecified, initial encounter: Secondary | ICD-10-CM

## 2020-07-16 NOTE — Telephone Encounter (Signed)
Norvasc may have been contributing to the swelling  He can take lasix in am -- -but try off of it to see if the swelling comes back' He should make f/u visit for bp check  He was also supposed to make an app to f/u with cardiology

## 2020-07-17 NOTE — Telephone Encounter (Signed)
Bp readings are a little higher that I like but not terrible---- better than too low -----lets see him next week Tell him to bring in his meters and we can check them with ours

## 2020-07-19 ENCOUNTER — Ambulatory Visit: Payer: BC Managed Care – PPO | Admitting: Internal Medicine

## 2020-07-20 ENCOUNTER — Other Ambulatory Visit: Payer: Self-pay

## 2020-07-20 NOTE — Patient Outreach (Signed)
Medicaid Managed Care    Pharmacy Note  07/20/2020 Name: Chase [name] MRN: [id number] DOB: 1961/04/16  Chase [name] is a 60 y.o. year old male who is a primary care patient of Ann Held, DO. The [facility] Managed Care Coordination team was consulted for assistance with disease management and care coordination needs.    Engaged with patient Engaged with patient by telephone for follow up visit in response to referral for case management and/or care coordination services.  Mr. Marrow was given information about Managed Medicaid Care Coordination team services today. [name] agreed to services and verbal consent obtained.   Objective:  Lab Results  Component Value Date   CREATININE 0.92 07/03/2020   CREATININE 0.91 05/30/2020   CREATININE 0.92 05/24/2020    Lab Results  Component Value Date   HGBA1C 8.2 (H) 05/30/2020       Component Value Date/Time   CHOL 145 05/30/2020 1504   TRIG 189.0 (H) 05/30/2020 1504   HDL 31.30 (L) 05/30/2020 1504   CHOLHDL 5 05/30/2020 1504   VLDL 37.8 05/30/2020 1504   LDLCALC 76 05/30/2020 1504   LDLDIRECT 75.0 05/24/2020 1052    Other: (TSH, CBC, Vit D, etc.)  Clinical ASCVD: No  The ASCVD Risk score Mikey Bussing DC Jr., et al., 2013) failed to calculate for the following reasons:   The patient has a prior MI or stroke diagnosis    Other: (CHADS2VASc if Afib, PHQ9 if depression, MMRC or CAT for COPD, ACT, DEXA)  BP Readings from Last 3 Encounters:  07/03/20 98/78  06/12/20 128/88  05/24/20 114/72    Assessment/Interventions: Review of patient past medical history, allergies, medications, health status, including review of consultants reports, laboratory and other test data, was performed as part of comprehensive evaluation and provision of chronic care management services.    DM2 -Dx 2014 Jardiance 25mg  Januvia 100mg  (Pharmacy having trouble running medication) Metformin 500mg  (New trial) Ozempic Dexcom: Average is  193 Tried/Failed: Glipizide (Rash), Metformin (GI). Can't take ER because hard for him to swallow December 2021 Plan: Called pt's pharmacy, they gave me a number [phone]) to call but it was unhelpful. Will coordinate with patient next week to determine plan  B Jan 2022: Ozempic NF, will ask Endo to change to Victoza. Januvia and Jardiance PA's went through. Will also ask Endo about Gore if GLP approved. Will focus on DEXCOM approval at f/u Feb 2022:  07/20/20: 112 07/19/20: 122 07/19/20: 146 07/18/20: 136 07/14/20: 229 07/12/20: 170 Plan: At goal,  patient stable/ symptoms controlled    Lipids Pravastatin 40mg   -Only taking 1QD, has trouble swallowing Plan: At goal,  patient stable/ symptoms controlled   Cardio Metoprolol Tartrate 25mg  BID Allopurinol 06/22/19 BP: 165/80  07/05/20: 128/78, 122/73 Tried/Failed: Amlodipine 5mg  (Hypotension) Jan 2022: PCP stopped Amlodipine due to hypotension Plan: At goal,  patient stable/ symptoms controlled   Kidney Stones/Gout -Unsure when last Gout attack Allopurinol 100mg  Plan: At goal,  patient stable/ symptoms controlled   BPH -Painful urination and frequency Terazosin 5mg  Finasteride 5mg  Plan: At goal,  patient stable/ symptoms controlled   Pain Pain scale: With meds:4/10       Without: 7/10 Gabapentin TID (Does work) Venlafaxine XR 75mg  Cyclobenzaprine 10mg  HS Plan: At goal,  patient stable/ symptoms controlled   Stool Softener PEG 17g/scoop PRN Plan: At goal,  patient stable/ symptoms controlled   GERD Famotidine 40mg /73ml Plan: At goal,  patient stable/ symptoms controlled    Depression Depression screen  [facility] 2/9 10/21/2018 08/04/2017 02/19/2017  Decreased Interest 0 (No Data) 1  Down, Depressed, Hopeless 1 2 2   PHQ - 2 Score 1 2 3   Altered sleeping 3 1 1   Tired, decreased energy 1 2  2   Change in appetite 0 2 2  Feeling bad or failure about yourself  0 3 2  Trouble concentrating 1 3 3    Moving slowly or fidgety/restless 1 2 1   Suicidal thoughts 0 0 1  PHQ-9 Score 7 15 15   Difficult doing work/chores Somewhat difficult - -  Some recent data might be hidden   Venlafaxine XR 75mg  Hydroxyzine 25mg  TID (Taking every day) -Patient states he's content and therapy and doesn't want to change things Plan: At goal,  patient stable/ symptoms controlled   SDOH (Social Determinants of Health) assessments and interventions performed:    Care Plan There are no care plans that you recently modified to display for this patient.   Conditions to be addressed/monitored: HTN, HLD, Hypertriglyceridemia and DM  Patient Care Plan: Medication Management    Problem Identified: Health Promotion or Disease Self-Management (General Plan of Care)     Goal: Self-Management Plan Developed   Note:   Current Barriers:  . Unable to independently afford treatment regimen . Unable to independently monitor therapeutic efficacy o Patient's Ozempic, Januvia, and Jardiance aren't covered (Was able to get Januvia and Jardiance covered today)  Pharmacist Clinical Goal(s):  Marland Kitchen Over the next 20 days, patient will verbalize ability to afford treatment regimen . achieve adherence to monitoring guidelines and medication adherence to achieve therapeutic efficacy . contact provider office for questions/concerns as evidenced notation of same in electronic health record through collaboration with PharmD and provider.  .   Interventions: . Inter-disciplinary care team collaboration (see longitudinal plan of care) . Comprehensive medication review performed; medication list updated in electronic medical record  @RXCPDIABETES @ @RXCPHYPERTENSION @ @RXCPHYPERLIPIDEMIA @  Patient Goals/Self-Care Activities . Over the next 20 days, patient will:  - take medications as prescribed check blood pressure and sugars, document, and provide at future appointments  Follow Up Plan: The care management team will reach  out to the patient again over the next 20 days.     Task: Medication Management   Note:   Current Barriers:  . Unable to independently monitor therapeutic efficacy . Unable to achieve control of DM and HTN  . Patient suffered multiple mini-strokes and has aphasia, he is self-conscious about talking because of it . Patient has very low health literacy, has no idea what medicines are for or what disease states do in the body. Spent a majority of visit explaining using metaphors  Pharmacist Clinical Goal(s):  Marland Kitchen Over the next  7 days, patient will achieve adherence to monitoring guidelines and medication adherence to achieve therapeutic efficacy through collaboration with PharmD and provider.  o Patient will test BP daily  Interventions: . Inter-disciplinary care team collaboration (see longitudinal plan of care) . Comprehensive medication review performed; medication list updated in electronic medical record  @RXCPDIABETES @ @RXCPHYPERTENSION @ @RXCPHYPERLIPIDEMIA @ @RXCPMENTALHEALTH @  Patient Goals/Self-Care Activities . Over the next 7 days, patient will:  - check blood pressure Daily, document, and provide at future appointments  Follow Up Plan: The care management team will reach out to the patient again over the next 7 days.       Medication Assistance: None required. Patient affirms current coverage meets needs.   Follow up: Agree  Plan: The care management team will reach out to the patient again over the  next 30 days.   [place] Constable, Pharm.D., Managed Medicaid Pharmacist - [phone]

## 2020-07-21 ENCOUNTER — Other Ambulatory Visit: Payer: Self-pay | Admitting: Family Medicine

## 2020-07-21 DIAGNOSIS — M1A09X0 Idiopathic chronic gout, multiple sites, without tophus (tophi): Secondary | ICD-10-CM

## 2020-07-21 DIAGNOSIS — K219 Gastro-esophageal reflux disease without esophagitis: Secondary | ICD-10-CM

## 2020-07-22 ENCOUNTER — Encounter: Payer: Self-pay | Admitting: Internal Medicine

## 2020-07-22 DIAGNOSIS — IMO0002 Reserved for concepts with insufficient information to code with codable children: Secondary | ICD-10-CM

## 2020-07-22 DIAGNOSIS — E1151 Type 2 diabetes mellitus with diabetic peripheral angiopathy without gangrene: Secondary | ICD-10-CM

## 2020-07-23 ENCOUNTER — Ambulatory Visit: Payer: BC Managed Care – PPO

## 2020-07-23 ENCOUNTER — Other Ambulatory Visit: Payer: Self-pay

## 2020-07-23 ENCOUNTER — Other Ambulatory Visit: Payer: Self-pay | Admitting: Internal Medicine

## 2020-07-23 DIAGNOSIS — I10 Essential (primary) hypertension: Secondary | ICD-10-CM

## 2020-07-23 MED ORDER — GLUCOSE BLOOD VI STRP: ORAL_STRIP | 3 refills | Status: DC

## 2020-07-23 MED ORDER — LANCETS THIN MISC: 3 refills | Status: DC

## 2020-07-23 MED ORDER — METOPROLOL TARTRATE 50 MG PO TABS: 50.0000 mg | ORAL_TABLET | Freq: Every day | ORAL | 1 refills | Status: DC

## 2020-07-23 NOTE — Progress Notes (Signed)
Pt here for Blood pressure check per   Pt currently takes: Metoprolol 25 MG, 1 tablet, PO, daily   Pt reports compliance with medication.  BP today @ = 140/90 HR = 98  Pt advised per  Dr. Etter Sjogren, increase metoprolol to 50 MG. Dr. Etter Sjogren wants patient to record blood pressures and send via Mychart over the next 2 to 3 weeks and follow up at the next appt in June or sooner if the blood pressures do not come down.

## 2020-07-24 ENCOUNTER — Other Ambulatory Visit: Payer: Self-pay | Admitting: Internal Medicine

## 2020-07-24 DIAGNOSIS — IMO0002 Reserved for concepts with insufficient information to code with codable children: Secondary | ICD-10-CM

## 2020-07-24 DIAGNOSIS — M5030 Other cervical disc degeneration, unspecified cervical region: Secondary | ICD-10-CM | POA: Diagnosis not present

## 2020-07-24 DIAGNOSIS — G894 Chronic pain syndrome: Secondary | ICD-10-CM | POA: Diagnosis not present

## 2020-07-24 DIAGNOSIS — Z79899 Other long term (current) drug therapy: Secondary | ICD-10-CM | POA: Diagnosis not present

## 2020-07-24 DIAGNOSIS — M5459 Other low back pain: Secondary | ICD-10-CM | POA: Diagnosis not present

## 2020-07-24 DIAGNOSIS — E1151 Type 2 diabetes mellitus with diabetic peripheral angiopathy without gangrene: Secondary | ICD-10-CM

## 2020-07-24 DIAGNOSIS — M5134 Other intervertebral disc degeneration, thoracic region: Secondary | ICD-10-CM | POA: Diagnosis not present

## 2020-07-24 DIAGNOSIS — Z79891 Long term (current) use of opiate analgesic: Secondary | ICD-10-CM | POA: Diagnosis not present

## 2020-07-24 MED ORDER — ACCU-CHEK GUIDE VI STRP: ORAL_STRIP | 3 refills | Status: DC

## 2020-07-24 MED ORDER — ACCU-CHEK GUIDE W/DEVICE KIT: PACK | 0 refills | Status: DC

## 2020-07-24 MED ORDER — ACCU-CHEK SOFTCLIX LANCETS MISC: 3 refills | Status: DC

## 2020-07-24 NOTE — Telephone Encounter (Signed)
That is correct   50 mg bid

## 2020-07-24 NOTE — Telephone Encounter (Signed)
You wanted him to take 50 MG once a day? Please advise

## 2020-07-26 ENCOUNTER — Other Ambulatory Visit: Payer: Self-pay

## 2020-07-26 NOTE — Patient Instructions (Signed)
Visit Information  Chase [name] was given information about Medicaid Managed Care team care coordination services as a part of their [place] Medicaid benefit. Chase [name] verbally consented to engagement with the [facility] Managed Care team.   . Chase [name] here are the resources that may be able to assist you with your MRI and medication: [employer] [phone], [facility] Medication Assistance Program for [place], [employer] Patient Assistance Program [phone].   For questions related to your [facility], please call: [phone] or visit the homepage here: [email or web address]  If you would like to schedule transportation through your [facility], please call the following number at least 2 days in advance of your appointment: [phone]  Chase [name] - following are the goals we discussed in your visit today:  Goals Addressed   None       Social Worker will follow up in 30 days.   [name]  Following is a copy of your plan of care:  Patient Care Plan: Medication Management    Problem Identified: Health Promotion or Disease Self-Management (General Plan of Care)     Goal: Self-Management Plan Developed   Note:   Current Barriers:  . Unable to independently afford treatment regimen . Unable to independently monitor therapeutic efficacy o Patient's Ozempic, Januvia, and Jardiance aren't covered (Was able to get Januvia and Jardiance covered today)  Pharmacist Clinical Goal(s):  Marland Kitchen Over the next 20 days, patient will verbalize ability to afford treatment regimen . achieve adherence to monitoring guidelines and medication adherence to achieve therapeutic efficacy . contact provider office for questions/concerns as evidenced notation of same in electronic health record through collaboration with PharmD and provider.   .   Interventions: . Inter-disciplinary care team collaboration (see longitudinal plan of care) . Comprehensive medication review performed; medication list updated in electronic medical record  @RXCPDIABETES @ @RXCPHYPERTENSION @ @RXCPHYPERLIPIDEMIA @  Patient Goals/Self-Care Activities . Over the next 20 days, patient will:  - take medications as prescribed check blood pressure and sugars, document, and provide at future appointments  Follow Up Plan: The care management team will reach out to the patient again over the next 20 days.     Task: Medication Management   Note:   Current Barriers:  . Unable to independently monitor therapeutic efficacy . Unable to achieve control of DM and HTN  . Patient suffered multiple mini-strokes and has aphasia, he is self-conscious about talking because of it . Patient has very low health literacy, has no idea what medicines are for or what disease states do in the body. Spent a majority of visit explaining using metaphors  Pharmacist Clinical Goal(s):  Marland Kitchen Over the next  7 days, patient will achieve adherence to monitoring guidelines and medication adherence to achieve therapeutic efficacy through collaboration with PharmD and provider.  o Patient will test BP daily  Interventions: . Inter-disciplinary care team collaboration (see longitudinal plan of care) . Comprehensive medication review performed; medication list updated in electronic medical record  @RXCPDIABETES @ @RXCPHYPERTENSION @ @RXCPHYPERLIPIDEMIA @ @RXCPMENTALHEALTH @  Patient Goals/Self-Care Activities . Over the next 7 days, patient will:  - check blood pressure Daily, document, and provide at future appointments  Follow Up Plan: The care management team will reach out to the patient again over the next 7 days.

## 2020-07-26 NOTE — Patient Outreach (Addendum)
Medicaid Managed Care Social Work Note  07/26/2020 Name:  Chase [name] MRN:  [id number] DOB:  [birth year]  Chase [name] is an 60 y.o. year old male who is a primary patient of Ann Held, DO.  The [facility] Managed Care Coordination team was consulted for assistance with:  Community Resources   Chase [name] was given information about Medicaid Managed CareCoordination services today. Chase [name] agreed to services and verbal consent obtained.  Engaged with patient  for by telephone forfollow up visit in response to referral for case management and/or care coordination services.   Assessments/Interventions:  Review of past medical history, allergies, medications, health status, including review of consultants reports, laboratory and other test data, was performed as part of comprehensive evaluation and provision of chronic care management services.  SDOH: (Social Determinant of Health) assessments and interventions performed:   . BSW provided patient with MRI assistance program [facility] [phone], [facility] Medication Assistance Program for [employer] [phone], [employer] Patient Assistance Program [phone]. Marland Kitchen Update 07/26/20: Patient stated he does not remember getting resources from [facility] and he has not scheduled an MRI. Patient asked if BSW could put resources in his MyChart so he would not have to write them down.   Advanced Directives Status:  Not addressed in this encounter.  Care Plan                 Allergies  Allergen Reactions  . Amoxicillin Hives, Itching and Other (See Comments)    White tongue; ? hives  . Fluticasone     Other reaction(s): Other Sores inside nose  . Sulfa Antibiotics Hives and Rash  . Flonase [Fluticasone Propionate]     Nasal Sores  . Terfenadine     ? reaction    Medications Reviewed Today    Reviewed by Carollee Herter, Alferd Apa, DO (Physician) on 07/04/20 at 1333  Med List Status: <None>  Medication Order Taking? Sig  Documenting Provider Last Dose Status Informant  allopurinol (ZYLOPRIM) 100 MG tablet [id number] Yes Take 1 tablet (100 mg total) by mouth daily. Roma Schanz R, DO Taking Active   Azelastine HCl 0.15 % SOLN [id number]  Two sprays each nostril 1-2 times a day as needed. Carollee Herter, Kendrick Fries R, DO  Active   budesonide (PULMICORT) 0.5 MG/2ML nebulizer solution [id number] Yes MIX WITH 240 CC SALINE IN THE NEILMED BOTTLE AS DIRECTED AND RINSE SINUSES TWICE A DAY. Valentina Shaggy, MD Taking Active   clotrimazole-betamethasone Donalynn Furlong) cream [id number] Yes APPLY TO AFFECTED AREA TWICE A DAY Carollee Herter, Alferd Apa, DO Taking Active   Continuous Blood Gluc Sensor (DEXCOM G6 SENSOR) MISC [id number]  Inject 1 Device into the skin continuous for 10 days. Philemon Kingdom, MD  Expired 06/22/20 2359   Continuous Blood Gluc Transmit (DEXCOM G6 TRANSMITTER) MISC [id number] Yes 1 Device by Does not apply route every 3 (three) months. Philemon Kingdom, MD Taking Active   cyclobenzaprine (FLEXERIL) 10 MG tablet [id number] Yes TAKE 1 TABLET BY MOUTH AT BEDTIME AS NEEDED FOR MUSCLE SPASMS Ann Held, DO Taking Active   cyproheptadine (PERIACTIN) 4 MG tablet [id number] Yes TAKE 1 TABLET (4 MG TOTAL) BY MOUTH AT BEDTIME. Dohmeier, Asencion Partridge, MD Taking Active   empagliflozin (JARDIANCE) 25 MG TABS tablet [id number] Yes Take 25 mg by mouth daily. Philemon Kingdom, MD Taking Active   EPINEPHrine 0.3 mg/0.3 mL IJ SOAJ injection [id number] Yes Use as directed for severe allergic reaction. Valentina Shaggy, MD Taking Active  famotidine (PEPCID) 40 MG/5ML suspension [id number] Yes TAKE 2.5 MLS (20 MG TOTAL) BY MOUTH DAILY. Carollee Herter, Kendrick Fries R, DO Taking Active   FIBER SELECT GUMMIES PO [id number] Yes Take 1 tablet by mouth daily. [provider] Taking Active Self  finasteride (PROSCAR) 5 MG tablet [id number] Yes Take 5 mg by mouth daily. [provider] Taking Active   fluticasone (CUTIVATE)  0.05 % cream [id number] Yes Apply topically 2 (two) times daily. Roma Schanz R, DO Taking Active   furosemide (LASIX) 20 MG tablet [id number] Yes Take 1 tablet (20 mg total) by mouth daily. Carollee Herter, Kendrick Fries R, DO  Active   gabapentin (NEURONTIN) 100 MG capsule [id number] Yes TAKE 1 CAPSULE BY MOUTH THREE TIMES A Adela Ports, DPM Taking Active   GuaiFENesin 200 MG/10ML SOLN [id number] Yes  [provider] Taking Active   HYDROcodone-acetaminophen (HYCET) 7.5-325 mg/15 ml solution [id number] Yes TAKE 15 ML EVERY 6 HOURS AS NEEDED FOR PAIN Ann Held, DO Taking Active   hydrOXYzine (ATARAX/VISTARIL) 25 MG tablet [id number] Yes TAKE 1 TABLET BY MOUTH EVERY 8 HOURS AS NEEDED FOR ANXIETY OR ITCHING Ann Held, DO Taking Active   metFORMIN (GLUCOPHAGE) 500 MG tablet [id number] Yes Take 1 tablet (500 mg total) by mouth 2 (two) times daily with a meal. Saguier, Percell Miller, PA-C Taking Active   metoprolol tartrate (LOPRESSOR) 25 MG tablet [id number] Yes TAKE 1 TABLET BY MOUTH TWICE A DAY Roma Schanz R, DO Taking Active   Multiple Vitamins-Minerals (AIRBORNE GUMMIES PO) [id number] Yes Take 1 each by mouth daily. [provider] Taking Active   OZEMPIC, 0.25 OR 0.5 MG/DOSE, 2 MG/1.5ML SOPN [id number]  INJECT 0.5 MG INTO THE SKIN ONCE A WEEK. Philemon Kingdom, MD  Active   polyethylene glycol powder [facility]) 17 GM/SCOOP powder [id number] Yes TAKE 17 GRAMS BY MOUTH 2 TIMES DAILY AS NEEDED. Ann Held, DO Taking Active   pravastatin (PRAVACHOL) 40 MG tablet [id number] Yes Take 1 tablet (40 mg total) by mouth daily. Carollee Herter, Kendrick Fries R, DO Taking Active   PROCTOFOAM [facility] rectal foam [id number] No APPLY FOAM THREE TIMES A DAY AS NEEDED FOR 7 DAYS  Patient not taking: No sig reported   Ann Held, DO Not Taking Active   terazosin (HYTRIN) 5 MG capsule [id number] Yes Take by mouth daily. [provider] Taking Active   venlafaxine  XR (EFFEXOR-XR) 75 MG 24 hr capsule [id number] Yes TAKE 1 CAPSULE (75 MG TOTAL) BY MOUTH DAILY WITH BREAKFAST. Ann Held, DO Taking Active           Patient Active Problem List   Diagnosis Date Noted  . Lower extremity edema 07/04/2020  . Other fatigue 07/04/2020  . Preoperative clearance 07/04/2020  . Hypotension 07/04/2020  . SOB (shortness of breath) 07/04/2020  . Allergic drug reaction 05/24/2020  . 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/23/2019  . Hyperlipidemia 11/23/2019  . Bilateral hearing loss 11/23/2019  . Tick bite of back 11/23/2019  . Chronic pain syndrome 11/23/2019  . Chest discomfort 06/08/2019  . Benign neoplasm of brain ([place]) 05/24/2019  . Rash 05/24/2019  . Dry skin 05/09/2019  . Seasonal and perennial allergic rhinitis 05/09/2019  . Food intolerance 05/09/2019  . DDD (degenerative disc disease), cervical 04/26/2019  . DDD (degenerative disc disease), lumbar 04/26/2019  . Chronic pansinusitis 06/30/2018  . Deviated septum 05/31/2018  . Nasal turbinate hypertrophy 05/31/2018  . Insect bite  03/16/2018  . 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 Excessive daytime sleepiness 02/11/2018  . Sleep apnea with use of continuous positive airway pressure (CPAP) 02/11/2018  . 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 Nocturia more than twice per night 02/11/2018  . Bilateral leg and foot pain 03/02/2017  . Fibromyalgia 03/02/2017  . Localized swelling of both lower legs 03/02/2017  . Other chronic pain 03/02/2017  . Raynaud's disease 03/02/2017  . Raynaud's phenomenon 03/02/2017  . Pain in joint of right shoulder 02/21/2017  . DM (diabetes mellitus) type II uncontrolled, periph vascular disorder ([place]) 01/02/2016  . Right shoulder pain 02/15/2015  . Rectal bleeding 02/12/2015  . Rectal ulcer with bleeding after hemorrhoid banding 02/11/2015  . Lower GI bleed   . Blood in stool, frank 02/10/2015   . Anemia 02/10/2015  . Acute renal insufficiency 02/10/2015  . Hemorrhoids 02/10/2015  . GI bleed 02/10/2015  . Acute GI bleeding   . Stroke ([place]) 01/26/2015  . Palpitations 01/26/2015  . Essential hypertension 01/26/2015  . Hyperlipidemia LDL goal <70 01/26/2015  . Cerebrovascular accident, old 11/16/2014  . Internal hemorrhoids 10/22/2014  . 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 OSA on CPAP 10/17/2014  . Persistent headaches 10/17/2014  . Subacute confusional state 10/17/2014  . Chronic cough 08/27/2014  . Sinus infection 06/30/2014  . Sinusitis, acute, maxillary 02/02/2014  . 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  . Prolapsed hemorrhoids 10/05/2013  . Hemangioma 02/12/2013  . Decreased hearing of both ears 02/12/2013  . Abnormality of gait 11/10/2012  . Hypersomnia, persistent 11/05/2012  . Unspecified sleep apnea 11/05/2012  . Pain in finger of right hand 10/06/2012  . Internal and external hemorrhoids without complication 33/00/7622  . Varicose veins of lower extremities with other complications 63/33/5456  . Acute upper respiratory infection 08/10/2012  . Bigeminy ? 03/22/2012  . Vertigo 03/19/2012  . Annual physical exam 11/26/2011  . Sleep apnea 11/03/2011  . Insomnia 08/05/2011  . Difficulty urinating   . Hemorrhoid 05/22/2011  . Dysuria 11/29/2010  . Arthralgia 07/25/2010  . Paresthesia 07/25/2010  . HTN (hypertension) 02/04/2010  . ANXIETY DEPRESSION 12/09/2007  . GERD 12/09/2007  . FATIGUE 12/09/2007  . 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 Chronic low back pain 07/20/2007  . SYMPTOM, HYPERSOMNIA NOS 02/19/2007  . Rhinitis, chronic 12/23/2006  . HEADACHE 12/23/2006  . NEPHROLITHIASIS 08/13/2006    Conditions to be addressed/monitored per PCP order:  MRI assistance  There are no care plans that you recently modified to display for this patient.   Follow up:  Patient agrees to Care Plan and Follow-up.  Plan:  The Managed Medicaid care management team will reach out to the patient again over the next 30 days.  Date/time of next scheduled Social Work care management/care coordination outreach:  08/23/20  Mickel Fuchs, [place], [place]  High Risk Managed Medicaid Team

## 2020-07-27 ENCOUNTER — Other Ambulatory Visit: Payer: Self-pay | Admitting: Podiatry

## 2020-07-28 ENCOUNTER — Other Ambulatory Visit: Payer: Self-pay | Admitting: Internal Medicine

## 2020-07-28 DIAGNOSIS — IMO0002 Reserved for concepts with insufficient information to code with codable children: Secondary | ICD-10-CM

## 2020-07-28 DIAGNOSIS — E1151 Type 2 diabetes mellitus with diabetic peripheral angiopathy without gangrene: Secondary | ICD-10-CM

## 2020-07-30 ENCOUNTER — Encounter: Payer: Self-pay | Admitting: Internal Medicine

## 2020-07-30 NOTE — Telephone Encounter (Signed)
Blood pressures printed and given to [place] to review

## 2020-08-01 ENCOUNTER — Other Ambulatory Visit: Payer: Self-pay | Admitting: Family Medicine

## 2020-08-01 DIAGNOSIS — M25511 Pain in right shoulder: Secondary | ICD-10-CM

## 2020-08-01 DIAGNOSIS — K649 Unspecified hemorrhoids: Secondary | ICD-10-CM

## 2020-08-06 ENCOUNTER — Other Ambulatory Visit: Payer: Self-pay

## 2020-08-06 DIAGNOSIS — K6289 Other specified diseases of anus and rectum: Secondary | ICD-10-CM | POA: Insufficient documentation

## 2020-08-06 DIAGNOSIS — K219 Gastro-esophageal reflux disease without esophagitis: Secondary | ICD-10-CM | POA: Insufficient documentation

## 2020-08-06 DIAGNOSIS — M25512 Pain in left shoulder: Secondary | ICD-10-CM

## 2020-08-06 DIAGNOSIS — R112 Nausea with vomiting, unspecified: Secondary | ICD-10-CM | POA: Insufficient documentation

## 2020-08-06 DIAGNOSIS — R1310 Dysphagia, unspecified: Secondary | ICD-10-CM | POA: Insufficient documentation

## 2020-08-06 DIAGNOSIS — G8929 Other chronic pain: Secondary | ICD-10-CM

## 2020-08-06 DIAGNOSIS — K645 Perianal venous thrombosis: Secondary | ICD-10-CM | POA: Insufficient documentation

## 2020-08-06 DIAGNOSIS — R0981 Nasal congestion: Secondary | ICD-10-CM | POA: Insufficient documentation

## 2020-08-06 DIAGNOSIS — I10 Essential (primary) hypertension: Secondary | ICD-10-CM | POA: Insufficient documentation

## 2020-08-06 DIAGNOSIS — E119 Type 2 diabetes mellitus without complications: Secondary | ICD-10-CM | POA: Insufficient documentation

## 2020-08-06 DIAGNOSIS — T7840XA Allergy, unspecified, initial encounter: Secondary | ICD-10-CM | POA: Insufficient documentation

## 2020-08-06 DIAGNOSIS — M1990 Unspecified osteoarthritis, unspecified site: Secondary | ICD-10-CM | POA: Insufficient documentation

## 2020-08-06 DIAGNOSIS — M7989 Other specified soft tissue disorders: Secondary | ICD-10-CM | POA: Insufficient documentation

## 2020-08-06 DIAGNOSIS — K921 Melena: Secondary | ICD-10-CM | POA: Insufficient documentation

## 2020-08-06 DIAGNOSIS — G930 Cerebral cysts: Secondary | ICD-10-CM | POA: Insufficient documentation

## 2020-08-06 HISTORY — DX: Other chronic pain: G89.29

## 2020-08-06 HISTORY — DX: Pain in left shoulder: M25.512

## 2020-08-08 ENCOUNTER — Other Ambulatory Visit (HOSPITAL_COMMUNITY): Payer: Self-pay | Admitting: Cardiology

## 2020-08-08 ENCOUNTER — Encounter: Payer: Self-pay | Admitting: Cardiology

## 2020-08-08 ENCOUNTER — Other Ambulatory Visit: Payer: Self-pay

## 2020-08-08 ENCOUNTER — Ambulatory Visit (INDEPENDENT_AMBULATORY_CARE_PROVIDER_SITE_OTHER): Payer: BC Managed Care – PPO | Admitting: Cardiology

## 2020-08-08 VITALS — BP 148/88 | HR 90 | Ht 69.0 in | Wt 179.0 lb

## 2020-08-08 DIAGNOSIS — E1165 Type 2 diabetes mellitus with hyperglycemia: Secondary | ICD-10-CM

## 2020-08-08 DIAGNOSIS — E785 Hyperlipidemia, unspecified: Secondary | ICD-10-CM

## 2020-08-08 DIAGNOSIS — IMO0002 Reserved for concepts with insufficient information to code with codable children: Secondary | ICD-10-CM

## 2020-08-08 DIAGNOSIS — E1151 Type 2 diabetes mellitus with diabetic peripheral angiopathy without gangrene: Secondary | ICD-10-CM

## 2020-08-08 DIAGNOSIS — I10 Essential (primary) hypertension: Secondary | ICD-10-CM

## 2020-08-08 MED ORDER — LOSARTAN POTASSIUM 50 MG PO TABS: 50.0000 mg | ORAL_TABLET | Freq: Every day | ORAL | 6 refills | Status: DC

## 2020-08-08 MED FILL — LOSARTAN POTASSIUM 50 MG TA: 50 | 30 days supply | Qty: 30 | Fill #0

## 2020-08-08 NOTE — Progress Notes (Signed)
Cardiology Office Note:    Date:  08/08/2020   ID:  Chase [name], DOB 03/16/1961, MRN [id number]  PCP:  Ann Held, DO  Cardiologist:  Jenean Lindau, MD   Referring MD: Carollee Herter, Alferd Apa, *    ASSESSMENT:    1. Primary hypertension   2. Essential (primary) hypertension   3. Hyperlipidemia LDL goal <70   4. DM (diabetes mellitus) type II uncontrolled, periph vascular disorder ([place])    PLAN:    In order of problems listed above:  1. Primary prevention stressed with the patient.  Importance of compliance with diet medication stressed any vocalized understanding.  He was advised to walk on a regular basis to the best of his ability. 2. Essential hypertension: Blood pressure stable and diet was emphasized.  I have added losartan 50 mg to his medications.  He will have a Chem-7 today.  Salt intake issues were discussed.  He will keep a track of his blood pressures and will be seen in follow-up appointment in a month or earlier if he has any concerns. 3. Mixed dyslipidemia: Diet was emphasized.  Lipids were reviewed and they were fine 4. Diabetes mellitus: Elevated hemoglobin A1c and I discussed this with the patient and this is followed by primary care. 5. Patient will be seen in follow-up appointment in 6 months or earlier if the patient has any concerns    Medication Adjustments/Labs and Tests Ordered: Current medicines are reviewed at length with the patient today.  Concerns regarding medicines are outlined above.  Orders Placed This Encounter  Procedures  . Basic metabolic panel   Meds ordered this encounter  Medications  . losartan (COZAAR) 50 MG tablet    Sig: Take 1 tablet (50 mg total) by mouth daily.    Dispense:  30 tablet    Refill:  6     No chief complaint on file.    History of Present Illness:    DVAUGHN FICKLE is a 60 y.o. male.  Patient has past medical history of essential hypertension dyslipidemia, diabetes mellitus. He denies any  problems at this time and takes care of activities of daily living.  No chest pain orthopnea or PND.  Patient has had history of stroke.  At the time of my evaluation, the patient is alert awake oriented and in no distress.  He mentions to me that his blood pressure is elevated.  His primary care provider discontinued his amlodipine.   Past Medical History:  Diagnosis Date  . Abnormality of gait 11/10/2012  . Acute GI bleeding   . Acute renal insufficiency 02/10/2015  . Acute upper respiratory infection 08/10/2012  . Allergic drug reaction 05/24/2020  . Allergy   . Anemia 02/10/2015  . Annual physical exam 11/26/2011  . 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 Arthralgia 07/25/2010  . Arthritis   . Benign neoplasm of brain ([place]) 05/24/2019  . Bigeminy ? 03/22/2012  . Bilateral hearing loss 11/23/2019  . Bilateral leg and foot pain 03/02/2017  . Blood in stool   . Blood in stool, frank 02/10/2015  . Body mass index (BMI) 26.0-26.9, adult 03/15/2020  . Brain cyst   . 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 Cerebrovascular accident, old 11/16/2014  . Chest discomfort 06/08/2019  . Chest pain   . Chronic cough 08/27/2014   CXR  08/2014:  Mild hyperinflation, no acute process Arlyce Harman 09/2014:  Difficulty with procedure, but essentially normal +CVA's in past, with aspiration risk by history Speech evaluation 2016:     . Chronic left shoulder pain 08/06/2020  . Chronic pain syndrome 11/23/2019  . Chronic pansinusitis 06/30/2018  . 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  . 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 Constipation   . 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 DDD (degenerative disc disease), cervical 04/26/2019  . DDD (degenerative disc disease), lumbar 04/26/2019  . Decreased hearing of both ears 02/12/2013  . Deviated septum 05/31/2018  . Diabetes mellitus without complication ([place])    . Difficulty urinating   . DM (diabetes mellitus) type II uncontrolled, periph vascular disorder ([place]) 01/02/2016  . Dry skin 05/09/2019  . Dysuria 11/29/2010  . Essential (primary) hypertension 01/26/2015  . Excessive daytime sleepiness 02/11/2018  . FATIGUE 12/09/2007   Qualifier: Diagnosis of  By: Cori Razor RN, Mikal Plane Fibromyalgia 03/02/2017  . Food intolerance 05/09/2019  . GERD 12/09/2007   Qualifier: Diagnosis of  By: Niel Hummer MD, Lorinda Creed   . GERD (gastroesophageal reflux disease)   . GI bleed 02/10/2015  . Headache(784.0)   . Hearing loss   . Hemangioma 02/12/2013  . Hemorrhoid 05/22/2011  . Hemorrhoids 02/10/2015  . HTN (hypertension) 02/04/2010   Qualifier: Diagnosis of  By: Charlett Blake MD, Erline Levine    . Hyperlipidemia   . Hyperlipidemia LDL goal <70 01/26/2015  . Hypersomnia, persistent 11/05/2012   epworth of 18 points, was placed on CPAP in 2008 with very low AHI ( no number  Mentioned) and struggles ever since with PAP. .    . Hypertension   . Hypotension 07/04/2020  . 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/23/2019  . Insect bite 03/16/2018  . Insomnia 08/05/2011  . Internal and external hemorrhoids without complication 06/18/7251  . Internal hemorrhoids 10/22/2014  . 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 Leg swelling   . Localized swelling of both lower legs 03/02/2017  . Low back pain 07/20/2007   Qualifier: Diagnosis of  By: Niel Hummer MD, Lorinda Creed   . Lower extremity edema 07/04/2020  . Lower GI bleed   . Nasal congestion   . Nasal turbinate hypertrophy 05/31/2018  . Neck pain 03/15/2020  . NEPHROLITHIASIS 08/13/2006   Qualifier: Diagnosis of  By: Beryle Lathe    . Nocturia more than twice per night 02/11/2018  . OSA on CPAP 10/17/2014  . Other chronic pain 03/02/2017  . Other fatigue 07/04/2020  . Pain in finger of right hand 10/06/2012  . Pain in joint of right shoulder 02/21/2017  . Palpitations 01/26/2015  . Paresthesia 07/25/2010  .  Persistent headaches 10/17/2014  . 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 Preoperative clearance 07/04/2020  . Prolapsed hemorrhoids 10/05/2013  . Radiculopathy, lumbar region 03/15/2020  . Rash 05/24/2019  . Raynaud's disease 03/02/2017  . Raynaud's phenomenon 03/02/2017  . Rectal bleeding 02/12/2015  . Rectal pain   . Rectal ulcer with bleeding after hemorrhoid banding 02/11/2015  . Rhinitis, chronic 12/23/2006   Qualifier: Diagnosis of  By: Cori Razor RN, Mikal Plane Right shoulder pain 02/15/2015  . Seasonal and perennial allergic rhinitis 05/09/2019  . Sinus infection 06/30/2014  . Sinusitis, acute, maxillary 02/02/2014  . Sleep apnea   . Sleep apnea with use of  continuous positive airway pressure (CPAP) 02/11/2018  . SOB (shortness of breath) 07/04/2020  . Stroke ([place])   . Stroke ([place])   . Subacute confusional state 10/17/2014  . SYMPTOM, HYPERSOMNIA NOS 02/19/2007   Qualifier: Diagnosis of  By: [place]    . Thrombosed hemorrhoids   . Tick bite of back 11/23/2019  . Trouble swallowing   . Unspecified sleep apnea 11/05/2012  . Varicose veins of lower extremities with other complications 02/01/2992  . Vertigo 03/19/2012    Past Surgical History:  Procedure Laterality Date  . CATARACT EXTRACTION     x 3  . [place]   cataracts  . FLEXIBLE SIGMOIDOSCOPY N/A 02/11/2015   Procedure: FLEXIBLE SIGMOIDOSCOPY;  Surgeon: Gatha Mayer, MD;  Location: [facility] ENDOSCOPY;  Service: Endoscopy;  Laterality: N/A;  . INNER EAR SURGERY     rt ear  . INNER EAR SURGERY  1992  . surgery - right arm     1983    Current Medications: Current Meds  Medication Sig  . allopurinol (ZYLOPRIM) 100 MG tablet Take 100 mg by mouth 2 (two) times daily.  Marland Kitchen azelastine (ASTELIN) 0.1 % nasal spray Place 2 sprays into both nostrils 2 (two) times daily.  . budesonide (PULMICORT) 0.5 MG/2ML nebulizer solution MIX  WITH 240 CC SALINE IN THE NEILMED BOTTLE AS DIRECTED AND RINSE SINUSES TWICE A DAY.  . clotrimazole-betamethasone (LOTRISONE) cream APPLY TO AFFECTED AREA TWICE A DAY  . cyclobenzaprine (FLEXERIL) 10 MG tablet TAKE 1 TABLET BY MOUTH AT BEDTIME AS NEEDED FOR MUSCLE SPASMS.  . cyproheptadine (PERIACTIN) 4 MG tablet TAKE 1 TABLET (4 MG TOTAL) BY MOUTH AT BEDTIME.  . empagliflozin (JARDIANCE) 25 MG TABS tablet Take 25 mg by mouth daily.  Marland Kitchen EPINEPHrine 0.3 mg/0.3 mL IJ SOAJ injection Inject 0.3 mg into the muscle as needed for anaphylaxis.  . famotidine (PEPCID) 40 MG/5ML suspension TAKE 2.5 MLS (20 MG TOTAL) BY MOUTH DAILY.  Marland Kitchen FIBER SELECT GUMMIES PO Take 1 tablet by mouth daily.  . finasteride (PROSCAR) 5 MG tablet Take 5 mg by mouth daily.  . fluticasone (CUTIVATE) 0.05 % cream Apply 1 application topically 2 (two) times daily.  Marland Kitchen gabapentin (NEURONTIN) 100 MG capsule TAKE 1 CAPSULE BY MOUTH THREE TIMES A DAY  . hydrOXYzine (ATARAX/VISTARIL) 25 MG tablet TAKE 1 TABLET BY MOUTH EVERY 8 HOURS AS NEEDED FOR ANXIETY OR ITCHING  . levocetirizine (XYZAL) 5 MG tablet Take 5 mg by mouth daily.  Marland Kitchen liraglutide (VICTOZA) 18 MG/3ML SOPN Inject under skin 1.8 mg once a day before breakfast  . losartan (COZAAR) 50 MG tablet Take 1 tablet (50 mg total) by mouth daily.  . metFORMIN (GLUCOPHAGE) 500 MG tablet Take 1 tablet (500 mg total) by mouth 2 (two) times daily with a meal.  . metoprolol tartrate (LOPRESSOR) 100 MG tablet Take 100 mg by mouth daily.  . Multiple Vitamins-Minerals (AIRBORNE GUMMIES PO) Take 1 each by mouth daily.  . polyethylene glycol powder (GLYCOLAX/MIRALAX) 17 GM/SCOOP powder Take 17 g by mouth daily as needed for constipation.  . pravastatin (PRAVACHOL) 40 MG tablet Take 40 mg by mouth 2 (two) times daily.  Marland Kitchen terazosin (HYTRIN) 5 MG capsule Take 5 mg by mouth daily.  Marland Kitchen venlafaxine XR (EFFEXOR-XR) 75 MG 24 hr capsule TAKE 1 CAPSULE (75 MG TOTAL) BY MOUTH DAILY WITH BREAKFAST.      Allergies:   Amoxicillin, Sulfa antibiotics, and Terfenadine   Social History   Socioeconomic History  .  Marital status: Single    Spouse name: Not on file  . Number of children: 0  . Years of education: Not on file  . Highest education level: Not on file  Occupational History  . Not on file  Tobacco Use  . Smoking status: Never Smoker  . Smokeless tobacco: Never Used  Vaping Use  . Vaping Use: Never used  Substance and Sexual Activity  . Alcohol use: No    Alcohol/week: 0.0 standard drinks    Comment: none  . Drug use: No  . Sexual activity: Never  Other Topics Concern  . Not on file  Social History Narrative   No caffeine intake except for chocolate.  Exercised-walking   Social Determinants of Radio broadcast assistant Strain: Not on file  Food Insecurity: Not on file  Transportation Needs: Not on file  Physical Activity: Not on file  Stress: Not on file  Social Connections: Not on file     Family History: The patient's family history includes Breast cancer (age of onset: 33) in his mother; Heart attack in his father; Heart disease in his father; Huntington's disease in his mother; Hypertension in his brother and father; Stroke in his father. There is no history of Hyperlipidemia or Diabetes.  ROS:   Please see the history of present illness.    All other systems reviewed and are negative.  EKGs/Labs/Other Studies Reviewed:    The following studies were reviewed today: I discussed my findings with the patient at length   Recent Labs: 07/03/2020: ALT 20; BUN 10; Creatinine, Ser 0.92; Hemoglobin 13.8; Platelets 287.0; Potassium 3.3; Sodium 139; TSH 4.40  Recent Lipid Panel    Component Value Date/Time   CHOL 145 05/30/2020 1504   TRIG 189.0 (H) 05/30/2020 1504   HDL 31.30 (L) 05/30/2020 1504   CHOLHDL 5 05/30/2020 1504   VLDL 37.8 05/30/2020 1504   LDLCALC 76 05/30/2020 1504   LDLDIRECT 75.0 05/24/2020 1052    Physical Exam:    VS:  BP (!)  148/88   Pulse 90   Ht 5\' 9"  (1.753 m)   Wt 179 lb (81.2 kg)   SpO2 96%   BMI 26.43 kg/m     Wt Readings from Last 3 Encounters:  08/08/20 179 lb (81.2 kg)  07/03/20 188 lb 9.6 oz (85.5 kg)  06/12/20 187 lb (84.8 kg)     GEN: Patient is in no acute distress HEENT: Normal NECK: No JVD; No carotid bruits LYMPHATICS: No lymphadenopathy CARDIAC: Hear sounds regular, 2/6 systolic murmur at the apex. RESPIRATORY:  Clear to auscultation without rales, wheezing or rhonchi  ABDOMEN: Soft, non-tender, non-distended MUSCULOSKELETAL:  No edema; No deformity  SKIN: Warm and dry NEUROLOGIC:  Alert and oriented x 3 PSYCHIATRIC:  Normal affect   Signed, Jenean Lindau, MD  08/08/2020 9:23 AM    [facility]

## 2020-08-08 NOTE — Patient Instructions (Signed)
Medication Instructions:  Your physician has recommended you make the following change in your medication:   Start Losartan 50 mg daily.  *If you need a refill on your cardiac medications before your next appointment, please call your pharmacy*   Lab Work: Your physician recommends that you have a BMET today in the office.  If you have labs (blood work) drawn today and your tests are completely normal, you will receive your results only by: Marland Kitchen MyChart Message (if you have MyChart) OR . A paper copy in the mail If you have any lab test that is abnormal or we need to change your treatment, we will call you to review the results.   Testing/Procedures: None ordered   Follow-Up: At [facility], you and your health needs are our priority.  As part of our continuing mission to provide you with exceptional heart care, we have created designated Provider Care Teams.  These Care Teams include your primary Cardiologist (physician) and Advanced Practice Providers (APPs -  Physician Assistants and Nurse Practitioners) who all work together to provide you with the care you need, when you need it.  We recommend signing up for the patient portal called "MyChart".  Sign up information is provided on this After Visit Summary.  MyChart is used to connect with patients for Virtual Visits (Telemedicine).  Patients are able to view lab/test results, encounter notes, upcoming appointments, etc.  Non-urgent messages can be sent to your provider as well.   To learn more about what you can do with MyChart, go to [email or web address].    Your next appointment:   1 month(s)  The format for your next appointment:   In Person  Provider:   Jyl Heinz, MD   Other Instructions Losartan Tablets What is this medicine? LOSARTAN (loe SAR tan) is an angiotensin II receptor blocker, also known as an ARB. It treats high blood pressure. It can slow kidney damage in some patients. It may also be used to lower  the risk of stroke. This medicine may be used for other purposes; ask your health care provider or pharmacist if you have questions. COMMON BRAND NAME(S): Cozaar What should I tell my health care provider before I take this medicine? They need to know if you have any of these conditions:  heart failure  kidney disease  liver disease  an unusual or allergic reaction to losartan, other medicines, foods, dyes, or preservatives  pregnant or trying to get pregnant  breast-feeding How should I use this medicine? Take this medicine by mouth. Take it as directed on the prescription label at the same time every day. You can take it with or without food. If it upsets your stomach, take it with food. Keep taking it unless your health care provider tells you to stop. Talk to your health care provider about the use of this medicine in children. While it may be prescribed for children as young as 6 for selected conditions, precautions do apply. Overdosage: If you think you have taken too much of this medicine contact a poison control center or emergency room at once. NOTE: This medicine is only for you. Do not share this medicine with others. What if I miss a dose? If you miss a dose, take it as soon as you can. If it is almost time for your next dose, take only that dose. Do not take double or extra doses. What may interact with this medicine?  aliskiren  ACE inhibitors, like enalapril or lisinopril  diuretics,  especially amiloride, eplerenone, spironolactone, or triamterene  lithium  NSAIDs, medicines for pain and inflammation, like ibuprofen or naproxen  potassium salts or potassium supplements This list may not describe all possible interactions. Give your health care provider a list of all the medicines, herbs, non-prescription drugs, or dietary supplements you use. Also tell them if you smoke, drink alcohol, or use illegal drugs. Some items may interact with your medicine. What should I  watch for while using this medicine? Visit your health care provider for regular check ups. Check your blood pressure as directed. Ask your health care provider what your blood pressure should be. Also, find out when you should contact him or her. Do not treat yourself for coughs, colds, or pain while you are using this medicine without asking your health care provider for advice. Some medicines may increase your blood pressure. Women should inform their health care provider if they wish to become pregnant or think they might be pregnant. There is a potential for serious side effects to an unborn child. Talk to your health care provider for more information. You may get drowsy or dizzy. Do not drive, use machinery, or do anything that needs mental alertness until you know how this medicine affects you. Do not stand or sit up quickly, especially if you are an older patient. This reduces the risk of dizzy or fainting spells. Alcohol can make you more drowsy and dizzy. Avoid alcoholic drinks. Avoid salt substitutes unless you are told otherwise by your health care provider. What side effects may I notice from receiving this medicine? Side effects that you should report to your doctor or health care professional as soon as possible:  allergic reactions (skin rash, itching or hives, swelling of the hands, feet, face, lips, throat, or tongue)  breathing problems  high potassium levels (chest pain; or fast, irregular heartbeat; muscle weakness)  kidney injury (trouble passing urine or change in the amount of urine)  low blood pressure (dizziness; feeling faint or lightheaded, falls; unusually weak or tired) Side effects that usually do not require medical attention (report to your doctor or health care professional if they continue or are bothersome):  cough  headache  nasal congestion or stuffiness  nausea or stomach pain This list may not describe all possible side effects. Call your doctor for  medical advice about side effects. You may report side effects to FDA at 1-800-FDA-1088. Where should I keep my medicine? Keep out of the reach of children and pets. Store at room temperature between 20 and 25 degrees C (68 and 77 degrees F). Protect from light. Keep the container tightly closed. Get rid of any unused medicine after the expiration date. To get rid of medicines that are no longer needed or have expired:  Take the medicine to a medicine take-back program. Check with your pharmacy or law enforcement to find a location.  If you cannot return the medicine, check the label or package insert to see if the medicine should be thrown out in the garbage or flushed down the toilet. If you are not sure, ask your health care provider. If it is safe to put in the trash, empty the medicine out of the container. Mix the medicine with cat litter, dirt, coffee grounds, or other unwanted substance. Seal the mixture in a bag or container. Put it in the trash. NOTE: This sheet is a summary. It may not cover all possible information. If you have questions about this medicine, talk to your doctor, pharmacist,  or health care provider.  2021 Elsevier/Gold Standard (2019-08-11 16:16:09)

## 2020-08-09 ENCOUNTER — Telehealth: Payer: Self-pay | Admitting: Licensed Clinical Social Worker

## 2020-08-09 ENCOUNTER — Other Ambulatory Visit (HOSPITAL_COMMUNITY): Payer: Self-pay | Admitting: Cardiology

## 2020-08-09 LAB — BASIC METABOLIC PANEL
BUN/Creatinine Ratio: 9 (ref 9–20)
BUN: 9 mg/dL (ref 6–24)
CO2: 25 mmol/L (ref 20–29)
Calcium: 9.9 mg/dL (ref 8.7–10.2)
Chloride: 99 mmol/L (ref 96–106)
Creatinine, Ser: 1.04 mg/dL (ref 0.76–1.27)
GFR calc Af Amer: 90 mL/min/{1.73_m2} (ref >59)
GFR calc non Af Amer: 78 mL/min/{1.73_m2} (ref >59)
Glucose: 121 mg/dL — ABNORMAL HIGH (ref 65–99)
Potassium: 3.3 mmol/L — ABNORMAL LOW (ref 3.5–5.2)
Sodium: 140 mmol/L (ref 134–144)

## 2020-08-09 MED ORDER — POTASSIUM CHLORIDE CRYS ER 20 MEQ PO TBCR: 40.0000 meq | EXTENDED_RELEASE_TABLET | Freq: Every day | ORAL | 3 refills | Status: DC

## 2020-08-09 NOTE — Addendum Note (Signed)
Addended by: Truddie Hidden on: 08/09/2020 01:36 PM   Modules accepted: Orders

## 2020-08-09 NOTE — Telephone Encounter (Signed)
CSW received referral to assist patient with a BP cuff. Unfortunately, CSW unable to ship cuff to a PO Box and have left message for patient to return call with an address. [name], [place], [place]

## 2020-08-09 NOTE — Addendum Note (Signed)
Addended by: Truddie Hidden on: 08/09/2020 01:25 PM   Modules accepted: Orders

## 2020-08-09 NOTE — Telephone Encounter (Signed)
CSW referred to assist patient with obtaining a BP cuff. CSW contacted patient to inform cuff will be delivered to home. Patient grateful for support and assistance. CSW available as needed. Jackie Brennan, LCSW, CCSW-MCS [phone]  

## 2020-08-14 ENCOUNTER — Other Ambulatory Visit: Payer: BC Managed Care – PPO

## 2020-08-21 ENCOUNTER — Other Ambulatory Visit (INDEPENDENT_AMBULATORY_CARE_PROVIDER_SITE_OTHER): Payer: BC Managed Care – PPO

## 2020-08-21 ENCOUNTER — Other Ambulatory Visit: Payer: Self-pay

## 2020-08-21 DIAGNOSIS — E1169 Type 2 diabetes mellitus with other specified complication: Secondary | ICD-10-CM | POA: Diagnosis not present

## 2020-08-21 DIAGNOSIS — E785 Hyperlipidemia, unspecified: Secondary | ICD-10-CM

## 2020-08-21 DIAGNOSIS — E876 Hypokalemia: Secondary | ICD-10-CM

## 2020-08-21 LAB — COMPREHENSIVE METABOLIC PANEL
ALT: 28 U/L (ref 0–53)
AST: 24 U/L (ref 0–37)
Albumin: 4.3 g/dL (ref 3.5–5.2)
Alkaline Phosphatase: 79 U/L (ref 39–117)
BUN: 8 mg/dL (ref 6–23)
CO2: 35 mEq/L — ABNORMAL HIGH (ref 19–32)
Calcium: 9.4 mg/dL (ref 8.4–10.5)
Chloride: 101 mEq/L (ref 96–112)
Creatinine, Ser: 0.93 mg/dL (ref 0.40–1.50)
GFR: 89.57 mL/min (ref >60.00)
Glucose, Bld: 120 mg/dL — ABNORMAL HIGH (ref 70–99)
Potassium: 3.4 mEq/L — ABNORMAL LOW (ref 3.5–5.1)
Sodium: 142 mEq/L (ref 135–145)
Total Bilirubin: 0.9 mg/dL (ref 0.2–1.2)
Total Protein: 6.5 g/dL (ref 6.0–8.3)

## 2020-08-21 LAB — LIPID PANEL
Cholesterol: 122 mg/dL (ref 0–200)
HDL: 28.5 mg/dL — ABNORMAL LOW (ref >39.00)
NonHDL: 93.72
Total CHOL/HDL Ratio: 4
Triglycerides: 249 mg/dL — ABNORMAL HIGH (ref 0.0–149.0)
VLDL: 49.8 mg/dL — ABNORMAL HIGH (ref 0.0–40.0)

## 2020-08-21 LAB — LDL CHOLESTEROL, DIRECT: Direct LDL: 56 mg/dL

## 2020-08-21 NOTE — Addendum Note (Signed)
Addended by: Manuela Schwartz on: 08/21/2020 09:26 AM   Modules accepted: Orders

## 2020-08-23 ENCOUNTER — Other Ambulatory Visit: Payer: Self-pay

## 2020-08-23 NOTE — Patient Instructions (Signed)
Visit Information  Chase [name] was given information about Medicaid Managed Care team care coordination services as a part of their [place] Medicaid benefit. Chase [name] verbally consented to engagement with the [facility] Managed Care team.   For questions related to your [facility], please call: [phone] or visit the homepage here: [email or web address]  If you would like to schedule transportation through your [facility], please call the following number at least 2 days in advance of your appointment: [phone].   Chase [name] - following are the goals we discussed in your visit today:  Goals Addressed   None     The patient has been provided with contact information for the Managed Medicaid care management team and has been advised to call with any health related questions or concerns.   Chase [name]  Following is a copy of your plan of care:  Patient Care Plan: Medication Management    Problem Identified: Health Promotion or Disease Self-Management (General Plan of Care)     Goal: Self-Management Plan Developed   Note:   Current Barriers:  . Unable to independently afford treatment regimen . Unable to independently monitor therapeutic efficacy o Patient's Ozempic, Januvia, and Jardiance aren't covered (Was able to get Januvia and Jardiance covered today)  Pharmacist Clinical Goal(s):  Marland Kitchen Over the next 20 days, patient will verbalize ability to afford treatment regimen . achieve adherence to monitoring guidelines and medication adherence to achieve therapeutic efficacy . contact provider office for questions/concerns as evidenced notation of same in electronic health record through collaboration with PharmD and provider.  .   Interventions: . Inter-disciplinary care team collaboration (see longitudinal plan of care) . Comprehensive medication  review performed; medication list updated in electronic medical record  @RXCPDIABETES @ @RXCPHYPERTENSION @ @RXCPHYPERLIPIDEMIA @  Patient Goals/Self-Care Activities . Over the next 20 days, patient will:  - take medications as prescribed check blood pressure and sugars, document, and provide at future appointments  Follow Up Plan: The care management team will reach out to the patient again over the next 20 days.     Task: Medication Management   Note:   Current Barriers:  . Unable to independently monitor therapeutic efficacy . Unable to achieve control of DM and HTN  . Patient suffered multiple mini-strokes and has aphasia, he is self-conscious about talking because of it . Patient has very low health literacy, has no idea what medicines are for or what disease states do in the body. Spent a majority of visit explaining using metaphors  Pharmacist Clinical Goal(s):  Marland Kitchen Over the next  7 days, patient will achieve adherence to monitoring guidelines and medication adherence to achieve therapeutic efficacy through collaboration with PharmD and provider.  o Patient will test BP daily  Interventions: . Inter-disciplinary care team collaboration (see longitudinal plan of care) . Comprehensive medication review performed; medication list updated in electronic medical record  @RXCPDIABETES @ @RXCPHYPERTENSION @ @RXCPHYPERLIPIDEMIA @ @RXCPMENTALHEALTH @  Patient Goals/Self-Care Activities . Over the next 7 days, patient will:  - check blood pressure Daily, document, and provide at future appointments  Follow Up Plan: The care management team will reach out to the patient again over the next 7 days.

## 2020-08-23 NOTE — Patient Outreach (Signed)
Medicaid Managed Care Social Work Note  08/23/2020 Name:  Chase [name] MRN:  [id number] DOB:  25-Jan-1961  Chase [name] is an 60 y.o. year old male who is a primary patient of Ann Held, DO.  The [facility] Managed Care Coordination team was consulted for assistance with:  MRI and medication assistance  Chase [name] was given information about Medicaid Managed CareCoordination services today. [name] agreed to services and verbal consent obtained.  Engaged with patient  for by telephone forfollow up visit in response to referral for case management and/or care coordination services.   Assessments/Interventions:  Review of past medical history, allergies, medications, health status, including review of consultants reports, laboratory and other test data, was performed as part of comprehensive evaluation and provision of chronic care management services.  SDOH: (Social Determinant of Health) assessments and interventions performed:  BSW contacted patient to follow up on resources provided. Patient stated he has not had the chance to contact any of the resources, but will do so. Patient stated no other needs at this time. Advanced Directives Status:  Not addressed in this encounter.  Care Plan                 Allergies  Allergen Reactions  . Amoxicillin Hives, Itching and Other (See Comments)    White tongue; ? hives  . Sulfa Antibiotics Hives and Rash  . Terfenadine     ? reaction    Medications Reviewed Today    Reviewed by Lowella Grip, CMA (Certified Medical Assistant) on 08/08/20 at [phone]  Med List Status: <None>  Medication Order Taking? Sig Documenting Provider Last Dose Status Informant  Accu-Chek Softclix Lancets lancets [id number]  Use as instructed to check blood sugar 4 times daily Philemon Kingdom, MD  Active   allopurinol (ZYLOPRIM) 100 MG tablet [id number]  Take 100 mg by mouth 2 (two) times daily. [provider]  Active   azelastine  (ASTELIN) 0.1 % nasal spray [id number]  Place 2 sprays into both nostrils 2 (two) times daily. [provider]  Active   Blood Glucose Monitoring Suppl (ACCU-CHEK GUIDE) w/Device KIT [id number]  Use as instructed to check blood sugar 4 times daily. Philemon Kingdom, MD  Active   budesonide (PULMICORT) 0.5 MG/2ML nebulizer solution [id number]  MIX WITH 240 CC SALINE IN THE NEILMED BOTTLE AS DIRECTED AND RINSE SINUSES TWICE A DAY. Valentina Shaggy, MD  Active   clotrimazole-betamethasone Donalynn Furlong) cream [id number]  APPLY TO AFFECTED AREA TWICE A DAY Carollee Herter, Alferd Apa, DO  Active   Continuous Blood Gluc Transmit (DEXCOM G6 TRANSMITTER) MISC [id number]  1 Device by Does not apply route every 3 (three) months. Philemon Kingdom, MD  Active   cyclobenzaprine (FLEXERIL) 10 MG tablet [id number]  TAKE 1 TABLET BY MOUTH AT BEDTIME AS NEEDED FOR MUSCLE SPASMS. Carollee Herter, Kendrick Fries R, DO  Active   cyproheptadine (PERIACTIN) 4 MG tablet [id number]  TAKE 1 TABLET (4 MG TOTAL) BY MOUTH AT BEDTIME. Dohmeier, Asencion Partridge, MD  Active   empagliflozin (JARDIANCE) 25 MG TABS tablet [id number]  Take 25 mg by mouth daily. Philemon Kingdom, MD  Active   EPINEPHrine 0.3 mg/0.3 mL IJ SOAJ injection [id number]  Use as directed for severe allergic reaction. Valentina Shaggy, MD  Active   famotidine (PEPCID) 40 MG/5ML suspension [id number]  TAKE 2.5 MLS (20 MG TOTAL) BY MOUTH DAILY. [address], Alferd Apa, DO  Active   FIBER SELECT GUMMIES PO [id number]  Take 1 tablet  by mouth daily. [provider]  Active Self  finasteride (PROSCAR) 5 MG tablet [id number]  Take 5 mg by mouth daily. [provider]  Active   fluticasone (CUTIVATE) 0.05 % cream [id number]  Apply topically 2 (two) times daily. Carollee Herter, Kendrick Fries R, DO  Active   gabapentin (NEURONTIN) 100 MG capsule [id number]  TAKE 1 CAPSULE BY MOUTH THREE TIMES A DAY Evans, Dorathy Daft, DPM  Active   glucose blood (ACCU-CHEK GUIDE) test strip [id number]  USE AS  INSTRUCTED TO CHECK SUGARS 4X A Billie Ruddy, MD  Active   hydrOXYzine (ATARAX/VISTARIL) 25 MG tablet [id number]  TAKE 1 TABLET BY MOUTH EVERY 8 HOURS AS NEEDED FOR ANXIETY OR ITCHING [place] Chase, Alferd Apa, DO  Active   levocetirizine (XYZAL) 5 MG tablet [id number]  Take 5 mg by mouth daily. [provider]  Active   liraglutide (VICTOZA) 18 MG/3ML SOPN [id number]  Inject under skin 1.8 mg once a day before breakfast Philemon Kingdom, MD  Active   metFORMIN (GLUCOPHAGE) 500 MG tablet [id number]  Take 1 tablet (500 mg total) by mouth 2 (two) times daily with a meal. Saguier, Percell Miller, PA-C  Active   metoprolol tartrate (LOPRESSOR) 100 MG tablet [id number]  Take 100 mg by mouth daily. [provider]  Active   Multiple Vitamins-Minerals (AIRBORNE GUMMIES PO) [id number]  Take 1 each by mouth daily. [provider]  Active   polyethylene glycol powder (MIRALAX) 17 GM/SCOOP powder [id number]  Take 17 g by mouth daily as needed for constipation. [provider]  Active   pravastatin (PRAVACHOL) 40 MG tablet [id number]  Take 40 mg by mouth 2 (two) times daily. [provider]  Active   PROCTOFOAM [facility] rectal foam [id number]  APPLY FOAM THREE TIMES A DAY AS NEEDED FOR 7 DAYS Roma Schanz R, DO  Active   terazosin (HYTRIN) 5 MG capsule [id number]  Take 5 mg by mouth daily. [provider]  Active   venlafaxine XR (EFFEXOR-XR) 75 MG 24 hr capsule [id number]  TAKE 1 CAPSULE (75 MG TOTAL) BY MOUTH DAILY WITH BREAKFAST. Ann Held, DO  Active           Patient Active Problem List   Diagnosis Date Noted  . Chronic left shoulder pain 08/06/2020  . Allergy   . Arthritis   . Blood in stool   . Brain cyst   . Diabetes mellitus without complication ([place])   . GERD (gastroesophageal reflux disease)   . Hypertension   . Leg swelling   . Nasal congestion   . PONV (postoperative nausea and vomiting)   . Rectal pain   . Thrombosed hemorrhoids    . Trouble swallowing   . Lower extremity edema 07/04/2020  . Other fatigue 07/04/2020  . Preoperative clearance 07/04/2020  . Hypotension 07/04/2020  . SOB (shortness of breath) 07/04/2020  . Allergic drug reaction 05/24/2020  . Body mass index (BMI) 26.0-26.9, adult 03/15/2020  . 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 Radiculopathy, lumbar region 03/15/2020  . Neck pain 03/15/2020  . 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/23/2019  . Hyperlipidemia 11/23/2019  . Bilateral hearing loss 11/23/2019  . Tick bite of back 11/23/2019  . Chronic pain syndrome 11/23/2019  . Chest discomfort 06/08/2019  . Benign neoplasm of brain ([place]) 05/24/2019  . Rash 05/24/2019  . Dry skin 05/09/2019  . Seasonal and perennial allergic rhinitis 05/09/2019  . Food intolerance 05/09/2019  . DDD (degenerative disc  disease), cervical 04/26/2019  . DDD (degenerative disc disease), lumbar 04/26/2019  . Chronic pansinusitis 06/30/2018  . Deviated septum 05/31/2018  . Nasal turbinate hypertrophy 05/31/2018  . Insect bite 03/16/2018  . 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 Excessive daytime sleepiness 02/11/2018  . Sleep apnea with use of continuous positive airway pressure (CPAP) 02/11/2018  . 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 Nocturia more than twice per night 02/11/2018  . Bilateral leg and foot pain 03/02/2017  . Fibromyalgia 03/02/2017  . Localized swelling of both lower legs 03/02/2017  . Other chronic pain 03/02/2017  . Raynaud's disease 03/02/2017  . Raynaud's phenomenon 03/02/2017  . Pain in joint of right shoulder 02/21/2017  . DM (diabetes mellitus) type II uncontrolled, periph vascular disorder ([place]) 01/02/2016  . Right shoulder pain 02/15/2015  . Rectal bleeding 02/12/2015  . Rectal ulcer with bleeding after hemorrhoid banding 02/11/2015  . Lower GI bleed   . Blood in stool, frank 02/10/2015  . Anemia  02/10/2015  . Acute renal insufficiency 02/10/2015  . Hemorrhoids 02/10/2015  . GI bleed 02/10/2015  . Acute GI bleeding   . Stroke ([place]) 01/26/2015  . Palpitations 01/26/2015  . Essential (primary) hypertension 01/26/2015  . Hyperlipidemia LDL goal <70 01/26/2015  . Cerebrovascular accident, old 11/16/2014  . Internal hemorrhoids 10/22/2014  . 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 OSA on CPAP 10/17/2014  . Persistent headaches 10/17/2014  . Subacute confusional state 10/17/2014  . Chronic cough 08/27/2014  . Sinus infection 06/30/2014  . Sinusitis, acute, maxillary 02/02/2014  . 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  . Prolapsed hemorrhoids 10/05/2013  . Hemangioma 02/12/2013  . Decreased hearing of both ears 02/12/2013  . Abnormality of gait 11/10/2012  . Hypersomnia, persistent 11/05/2012  . Unspecified sleep apnea 11/05/2012  . Pain in finger of right hand 10/06/2012  . Internal and external hemorrhoids without complication 44/96/7591  . Varicose veins of lower extremities with other complications 63/84/6659  . Acute upper respiratory infection 08/10/2012  . Bigeminy ? 03/22/2012  . Vertigo 03/19/2012  . Annual physical exam 11/26/2011  . Sleep apnea 11/03/2011  . Insomnia 08/05/2011  . Difficulty urinating   . Hemorrhoid 05/22/2011  . Dysuria 11/29/2010  . Arthralgia 07/25/2010  . Paresthesia 07/25/2010  . HTN (hypertension) 02/04/2010  . ANXIETY DEPRESSION 12/09/2007  . GERD 12/09/2007  . FATIGUE 12/09/2007  . 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 Low back pain 07/20/2007  . SYMPTOM, HYPERSOMNIA NOS 02/19/2007  . Rhinitis, chronic 12/23/2006  . HEADACHE 12/23/2006  . NEPHROLITHIASIS 08/13/2006    Conditions to be addressed/monitored per PCP order:  MRI and medication assistance  There are no care plans that you recently modified to display for this patient.   Follow up:  Patient agrees to Care Plan and  Follow-up.  Plan: The patient has been provided with contact information for the Managed Medicaid care management team and has been advised to call with any health related questions or concerns.    Mickel Fuchs, BSW, [place]  High Risk Managed Medicaid Team

## 2020-08-24 ENCOUNTER — Other Ambulatory Visit: Payer: Self-pay | Admitting: Medical

## 2020-08-24 DIAGNOSIS — M549 Dorsalgia, unspecified: Secondary | ICD-10-CM | POA: Diagnosis not present

## 2020-08-24 DIAGNOSIS — M5134 Other intervertebral disc degeneration, thoracic region: Secondary | ICD-10-CM | POA: Diagnosis not present

## 2020-08-24 DIAGNOSIS — M5030 Other cervical disc degeneration, unspecified cervical region: Secondary | ICD-10-CM | POA: Diagnosis not present

## 2020-08-24 DIAGNOSIS — G894 Chronic pain syndrome: Secondary | ICD-10-CM | POA: Diagnosis not present

## 2020-08-27 ENCOUNTER — Telehealth: Payer: Self-pay | Admitting: *Deleted

## 2020-08-27 NOTE — Telephone Encounter (Signed)
He should not need labs here --- he sees endo for his dm

## 2020-08-27 NOTE — Telephone Encounter (Signed)
Pt is scheduled for lab appt tomorrow. There are no future lab orders in Epic. Schedule notes say cmp, lipid, a1c.  Looks like pt had cmp and lipid on 3/8. Last a1c was 05/30/20.  Does pt still need to come in for the hgb a1c?  If so, can you place future order?  If it is no longer needed please call pt and cancel lab appt.

## 2020-08-28 ENCOUNTER — Ambulatory Visit (INDEPENDENT_AMBULATORY_CARE_PROVIDER_SITE_OTHER): Payer: BC Managed Care – PPO | Admitting: Family Medicine

## 2020-08-28 ENCOUNTER — Other Ambulatory Visit (HOSPITAL_COMMUNITY): Payer: Self-pay | Admitting: Family Medicine

## 2020-08-28 ENCOUNTER — Encounter: Payer: Self-pay | Admitting: Family Medicine

## 2020-08-28 ENCOUNTER — Other Ambulatory Visit: Payer: BC Managed Care – PPO

## 2020-08-28 ENCOUNTER — Other Ambulatory Visit: Payer: Self-pay

## 2020-08-28 VITALS — BP 140/90 | HR 87 | Temp 98.5°F | Resp 18 | Ht 69.0 in | Wt 177.2 lb

## 2020-08-28 DIAGNOSIS — I4819 Other persistent atrial fibrillation: Secondary | ICD-10-CM | POA: Diagnosis not present

## 2020-08-28 DIAGNOSIS — H9193 Unspecified hearing loss, bilateral: Secondary | ICD-10-CM | POA: Diagnosis not present

## 2020-08-28 DIAGNOSIS — F332 Major depressive disorder, recurrent severe without psychotic features: Secondary | ICD-10-CM | POA: Diagnosis not present

## 2020-08-28 DIAGNOSIS — I10 Essential (primary) hypertension: Secondary | ICD-10-CM

## 2020-08-28 DIAGNOSIS — E876 Hypokalemia: Secondary | ICD-10-CM

## 2020-08-28 DIAGNOSIS — D332 Benign neoplasm of brain, unspecified: Secondary | ICD-10-CM | POA: Diagnosis not present

## 2020-08-28 DIAGNOSIS — E348 Other specified endocrine disorders: Secondary | ICD-10-CM | POA: Diagnosis not present

## 2020-08-28 DIAGNOSIS — N400 Enlarged prostate without lower urinary tract symptoms: Secondary | ICD-10-CM | POA: Diagnosis not present

## 2020-08-28 DIAGNOSIS — H9313 Tinnitus, bilateral: Secondary | ICD-10-CM | POA: Diagnosis not present

## 2020-08-28 MED ORDER — LOSARTAN POTASSIUM 100 MG PO TABS: 100.0000 mg | ORAL_TABLET | Freq: Every day | ORAL | 3 refills | Status: DC

## 2020-08-28 MED ORDER — FINASTERIDE 5 MG PO TABS: 5.0000 mg | ORAL_TABLET | Freq: Every day | ORAL | 3 refills | Status: DC

## 2020-08-28 NOTE — Telephone Encounter (Signed)
Can we cancel this appt. Pt doesn't need labs for Low[place] for 6 months

## 2020-08-28 NOTE — Progress Notes (Signed)
Patient ID: QUENCY TOBER, male    DOB: [birth year]  Age: 60 y.o. MRN: [id number]    Subjective:  Subjective  HPI Chase [name] presents for possible side effects to KCL--- it makes him nauseous and jittery so he stopped it and he would like to get it rechecked     Review of Systems  Constitutional: Negative for appetite change, diaphoresis, fatigue and unexpected weight change.  HENT: Positive for hearing loss and tinnitus.   Eyes: Negative for pain, redness and visual disturbance.  Respiratory: Negative for cough, chest tightness, shortness of breath and wheezing.   Cardiovascular: Negative for chest pain, palpitations and leg swelling.  Endocrine: Negative for cold intolerance, heat intolerance, polydipsia, polyphagia and polyuria.  Genitourinary: Negative for difficulty urinating, dysuria and frequency.  Neurological: Positive for dizziness. Negative for light-headedness, numbness and headaches.    History Past Medical History:  Diagnosis Date  . Abnormality of gait 11/10/2012  . Acute GI bleeding   . Acute renal insufficiency 02/10/2015  . Acute upper respiratory infection 08/10/2012  . Allergic drug reaction 05/24/2020  . Allergy   . Anemia 02/10/2015  . Annual physical exam 11/26/2011  . 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 Arthralgia 07/25/2010  . Arthritis   . Benign neoplasm of brain ([place]) 05/24/2019  . Bigeminy ? 03/22/2012  . Bilateral hearing loss 11/23/2019  . Bilateral leg and foot pain 03/02/2017  . Blood in stool   . Blood in stool, frank 02/10/2015  . Body mass index (BMI) 26.0-26.9, adult 03/15/2020  . Brain cyst   . 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 Cerebrovascular accident, old 11/16/2014  . Chest discomfort 06/08/2019  . Chest pain   . Chronic cough 08/27/2014   CXR 08/2014:  Mild hyperinflation, no acute process Arlyce Harman 09/2014:   Difficulty with procedure, but essentially normal +CVA's in past, with aspiration risk by history Speech evaluation 2016:     . Chronic left shoulder pain 08/06/2020  . Chronic pain syndrome 11/23/2019  . Chronic pansinusitis 06/30/2018  . 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  . 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 Constipation   . 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 DDD (degenerative disc disease), cervical 04/26/2019  . DDD (degenerative disc disease), lumbar 04/26/2019  . Decreased hearing of both ears 02/12/2013  . Deviated septum 05/31/2018  . Diabetes mellitus without complication ([place])   . Difficulty urinating   . DM (diabetes mellitus) type II uncontrolled, periph vascular disorder ([place]) 01/02/2016  . Dry skin 05/09/2019  . Dysuria 11/29/2010  . Essential (primary) hypertension 01/26/2015  . Excessive daytime sleepiness 02/11/2018  . FATIGUE 12/09/2007   Qualifier: Diagnosis of  By: Cori Razor RN, Mikal Plane Fibromyalgia 03/02/2017  . Food intolerance 05/09/2019  . GERD 12/09/2007   Qualifier: Diagnosis of  By: Niel Hummer MD, Lorinda Creed   . GERD (gastroesophageal reflux disease)   . GI bleed 02/10/2015  . Headache(784.0)   . Hearing loss   . Hemangioma 02/12/2013  . Hemorrhoid 05/22/2011  . Hemorrhoids 02/10/2015  . HTN (hypertension) 02/04/2010   Qualifier: Diagnosis of  By: Charlett Blake MD, Erline Levine    . Hyperlipidemia   . Hyperlipidemia LDL goal <70 01/26/2015  . Hypersomnia, persistent 11/05/2012   epworth of 18 points, was placed on CPAP in 2008 with very low AHI ( no number  Mentioned)  and struggles ever since with PAP. .    . Hypertension   . Hypotension 07/04/2020  . 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/23/2019  . Insect bite 03/16/2018  . Insomnia 08/05/2011  . Internal and external hemorrhoids without complication 2/54/2706  . Internal hemorrhoids 10/22/2014  . 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 Leg swelling   . Localized swelling of both lower legs  03/02/2017  . Low back pain 07/20/2007   Qualifier: Diagnosis of  By: Niel Hummer MD, Lorinda Creed   . Lower extremity edema 07/04/2020  . Lower GI bleed   . Nasal congestion   . Nasal turbinate hypertrophy 05/31/2018  . Neck pain 03/15/2020  . NEPHROLITHIASIS 08/13/2006   Qualifier: Diagnosis of  By: Beryle Lathe    . Nocturia more than twice per night 02/11/2018  . OSA on CPAP 10/17/2014  . Other chronic pain 03/02/2017  . Other fatigue 07/04/2020  . Pain in finger of right hand 10/06/2012  . Pain in joint of right shoulder 02/21/2017  . Palpitations 01/26/2015  . Paresthesia 07/25/2010  . Persistent headaches 10/17/2014  . 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 Preoperative clearance 07/04/2020  . Prolapsed hemorrhoids 10/05/2013  . Radiculopathy, lumbar region 03/15/2020  . Rash 05/24/2019  . Raynaud's disease 03/02/2017  . Raynaud's phenomenon 03/02/2017  . Rectal bleeding 02/12/2015  . Rectal pain   . Rectal ulcer with bleeding after hemorrhoid banding 02/11/2015  . Rhinitis, chronic 12/23/2006   Qualifier: Diagnosis of  By: Cori Razor RN, Mikal Plane Right shoulder pain 02/15/2015  . Seasonal and perennial allergic rhinitis 05/09/2019  . Sinus infection 06/30/2014  . Sinusitis, acute, maxillary 02/02/2014  . Sleep apnea   . Sleep apnea with use of continuous positive airway pressure (CPAP) 02/11/2018  . SOB (shortness of breath) 07/04/2020  . Stroke ([place])   . Stroke ([place])   . Subacute confusional state 10/17/2014  . SYMPTOM, HYPERSOMNIA NOS 02/19/2007   Qualifier: Diagnosis of  By: [place]    . Thrombosed hemorrhoids   . Tick bite of back 11/23/2019  . Trouble swallowing   . Unspecified sleep apnea 11/05/2012  . Varicose veins of lower extremities with other complications 2/37/6283  . Vertigo 03/19/2012    He has a past surgical history that includes Cataract extraction; Inner ear surgery; Eye surgery  ([place]); Inner ear surgery (1992); surgery - right arm; and Flexible sigmoidoscopy (N/A, 02/11/2015).   His family history includes Breast cancer (age of onset: 27) in his mother; Heart attack in his father; Heart disease in his father; Huntington's disease in his mother; Hypertension in his brother and father; Stroke in his father.He reports that he has never smoked. He has never used smokeless tobacco. He reports that he does not drink alcohol and does not use drugs.  Current Outpatient Medications on File Prior to Visit  Medication Sig Dispense Refill  . allopurinol (ZYLOPRIM) 100 MG tablet Take 100 mg by mouth 2 (two) times daily.    Marland Kitchen azelastine (ASTELIN) 0.1 % nasal spray Place 2 sprays into both nostrils 2 (two) times daily.    . budesonide (PULMICORT) 0.5 MG/2ML nebulizer solution MIX WITH 240 CC SALINE IN THE NEILMED BOTTLE AS DIRECTED AND RINSE SINUSES TWICE A DAY. 360 mL 1  . clotrimazole-betamethasone (LOTRISONE) cream APPLY TO AFFECTED AREA TWICE A DAY 30  g 0  . cyclobenzaprine (FLEXERIL) 10 MG tablet TAKE 1 TABLET BY MOUTH AT BEDTIME AS NEEDED FOR MUSCLE SPASMS. 30 tablet 1  . cyproheptadine (PERIACTIN) 4 MG tablet TAKE 1 TABLET (4 MG TOTAL) BY MOUTH AT BEDTIME. 90 tablet 1  . empagliflozin (JARDIANCE) 25 MG TABS tablet Take 25 mg by mouth daily. 30 tablet 6  . EPINEPHrine 0.3 mg/0.3 mL IJ SOAJ injection Inject 0.3 mg into the muscle as needed for anaphylaxis.    . famotidine (PEPCID) 40 MG/5ML suspension TAKE 2.5 MLS (20 MG TOTAL) BY MOUTH DAILY. 50 mL 5  . FIBER SELECT GUMMIES PO Take 1 tablet by mouth daily.    . fluticasone (CUTIVATE) 0.05 % cream Apply 1 application topically 2 (two) times daily.    Marland Kitchen gabapentin (NEURONTIN) 100 MG capsule TAKE 1 CAPSULE BY MOUTH THREE TIMES A DAY 90 capsule 3  . hydrOXYzine (ATARAX/VISTARIL) 25 MG tablet TAKE 1 TABLET BY MOUTH EVERY 8 HOURS AS NEEDED FOR ANXIETY OR ITCHING 270 tablet 1  . liraglutide (VICTOZA) 18 MG/3ML SOPN Inject  under skin 1.8 mg once a day before breakfast 9 mL 3  . metFORMIN (GLUCOPHAGE) 500 MG tablet TAKE 1 TABLET BY MOUTH 2 TIMES DAILY WITH A MEAL. 180 tablet 1  . metoprolol tartrate (LOPRESSOR) 100 MG tablet Take 100 mg by mouth daily.    . Multiple Vitamins-Minerals (AIRBORNE GUMMIES PO) Take 1 each by mouth daily.    . polyethylene glycol powder (GLYCOLAX/MIRALAX) 17 GM/SCOOP powder Take 17 g by mouth daily as needed for constipation.    . potassium chloride SA (KLOR-CON) 20 MEQ tablet Take 2 tablets (40 mEq total) by mouth daily. 60 tablet 3  . pravastatin (PRAVACHOL) 40 MG tablet Take 40 mg by mouth 2 (two) times daily.    Marland Kitchen terazosin (HYTRIN) 5 MG capsule Take 5 mg by mouth daily.    Marland Kitchen venlafaxine XR (EFFEXOR-XR) 75 MG 24 hr capsule TAKE 1 CAPSULE (75 MG TOTAL) BY MOUTH DAILY WITH BREAKFAST. 90 capsule 1   No current facility-administered medications on file prior to visit.     Objective:  Objective  Physical Exam Vitals and nursing note reviewed.  Constitutional:      General: He is sleeping.     Appearance: He is well-developed.  HENT:     Head: Normocephalic and atraumatic.  Eyes:     Pupils: Pupils are equal, round, and reactive to light.  Neck:     Thyroid: No thyromegaly.  Cardiovascular:     Rate and Rhythm: Normal rate and regular rhythm.     Heart sounds: No murmur heard.   Pulmonary:     Effort: Pulmonary effort is normal. No respiratory distress.     Breath sounds: Normal breath sounds. No wheezing or rales.  Chest:     Chest wall: No tenderness.  Musculoskeletal:        General: No tenderness.     Cervical back: Normal range of motion and neck supple.  Skin:    General: Skin is warm and dry.  Neurological:     Mental Status: He is oriented to person, place, and time.  Psychiatric:        Behavior: Behavior normal.        Thought Content: Thought content normal.        Judgment: Judgment normal.    BP 140/90 (BP Location: Right Arm, Patient Position:  Sitting, Cuff Size: Normal)   Pulse 87   Temp 98.5 F (36.9 C) (  Oral)   Resp 18   Ht 5\' 9"  (1.753 m)   Wt 177 lb 3.2 oz (80.4 kg)   SpO2 97%   BMI 26.17 kg/m  Wt Readings from Last 3 Encounters:  08/28/20 177 lb 3.2 oz (80.4 kg)  08/08/20 179 lb (81.2 kg)  07/03/20 188 lb 9.6 oz (85.5 kg)     Lab Results  Component Value Date   WBC 7.2 07/03/2020   HGB 13.8 07/03/2020   HCT 40.8 07/03/2020   PLT 287.0 07/03/2020   GLUCOSE 120 (H) 08/21/2020   CHOL 122 08/21/2020   TRIG 249.0 (H) 08/21/2020   HDL 28.50 (L) 08/21/2020   LDLDIRECT 56.0 08/21/2020   LDLCALC 76 05/30/2020   ALT 28 08/21/2020   AST 24 08/21/2020   NA 142 08/21/2020   K 3.4 (L) 08/21/2020   CL 101 08/21/2020   CREATININE 0.93 08/21/2020   BUN 8 08/21/2020   CO2 35 (H) 08/21/2020   TSH 4.40 07/03/2020   PSA 1.01 10/03/2014   INR 1.11 02/10/2015   HGBA1C 8.2 (H) 05/30/2020   MICROALBUR <0.7 05/24/2020    DG Chest 2 View  Result Date: 07/03/2020 CLINICAL DATA:  Shortness of breath x1 week. EXAM: CHEST - 2 VIEW COMPARISON:  August 25, 2014 FINDINGS: Mild, stable diffusely increased lung markings are seen without evidence of acute infiltrate, pleural effusion or pneumothorax. The heart size and mediastinal contours are within normal limits. The visualized skeletal structures are unremarkable. IMPRESSION: Stable exam without active cardiopulmonary disease. Electronically Signed   By: Virgina Norfolk M.D.   On: 07/03/2020 14:58     Assessment & Plan:  Plan  I have discontinued Chase Fanny "Jeff"'s levocetirizine and losartan. I have also changed his finasteride. Additionally, I am having him start on losartan. Lastly, I am having him maintain his [place] PO, Multiple Vitamins-Minerals (AIRBORNE GUMMIES PO), cyproheptadine, Jardiance, budesonide, venlafaxine XR, hydrOXYzine, clotrimazole-betamethasone, Victoza, famotidine, gabapentin, cyclobenzaprine, allopurinol, azelastine, polyethylene glycol  powder, terazosin, metoprolol tartrate, pravastatin, EPINEPHrine, fluticasone, potassium chloride SA, and metFORMIN.  Meds ordered this encounter  Medications  . finasteride (PROSCAR) 5 MG tablet    Sig: Take 1 tablet (5 mg total) by mouth daily.    Dispense:  90 tablet    Refill:  3  . losartan (COZAAR) 100 MG tablet    Sig: Take 1 tablet (100 mg total) by mouth daily.    Dispense:  90 tablet    Refill:  3    Problem List Items Addressed This Visit      Unprioritized   Benign neoplasm of brain ([place])   Bilateral hearing loss    F/u ent       Relevant Orders   Ambulatory referral to ENT   BPH without urinary obstruction    Stable as long as he stays on meds      Relevant Medications   finasteride (PROSCAR) 5 MG tablet   HTN (hypertension)   Relevant Medications   losartan (COZAAR) 100 MG tablet   Hypokalemia - Primary    Recheck  Labs       Relevant Orders   Basic metabolic panel   Persistent atrial fibrillation (HCC)    F/u cardiology      Relevant Medications   losartan (COZAAR) 100 MG tablet   Pineal gland cyst    Per neuro surgery       Severe episode of recurrent major depressive disorder, without psychotic features (HCC)   Tinnitus of both ears  F/u ent       Relevant Orders   Ambulatory referral to ENT      Follow-up: Return in about 3 months (around 11/28/2020), or if symptoms worsen or fail to improve, for hypertension, hyperlipidemia.  Ann Held, DO

## 2020-08-28 NOTE — Telephone Encounter (Signed)
I canceled appt

## 2020-08-28 NOTE — Patient Instructions (Signed)

## 2020-08-29 ENCOUNTER — Other Ambulatory Visit: Payer: Self-pay | Admitting: Family Medicine

## 2020-08-29 DIAGNOSIS — H9313 Tinnitus, bilateral: Secondary | ICD-10-CM

## 2020-08-29 DIAGNOSIS — F332 Major depressive disorder, recurrent severe without psychotic features: Secondary | ICD-10-CM

## 2020-08-29 DIAGNOSIS — E1169 Type 2 diabetes mellitus with other specified complication: Secondary | ICD-10-CM

## 2020-08-29 DIAGNOSIS — E876 Hypokalemia: Secondary | ICD-10-CM | POA: Insufficient documentation

## 2020-08-29 DIAGNOSIS — N400 Enlarged prostate without lower urinary tract symptoms: Secondary | ICD-10-CM | POA: Insufficient documentation

## 2020-08-29 DIAGNOSIS — I4819 Other persistent atrial fibrillation: Secondary | ICD-10-CM

## 2020-08-29 DIAGNOSIS — E785 Hyperlipidemia, unspecified: Secondary | ICD-10-CM

## 2020-08-29 HISTORY — DX: Tinnitus, bilateral: H93.13

## 2020-08-29 HISTORY — DX: Hypokalemia: E87.6

## 2020-08-29 HISTORY — DX: Major depressive disorder, recurrent severe without psychotic features: F33.2

## 2020-08-29 HISTORY — DX: Other persistent atrial fibrillation: I48.19

## 2020-08-29 HISTORY — DX: Benign prostatic hyperplasia without lower urinary tract symptoms: N40.0

## 2020-08-29 LAB — BASIC METABOLIC PANEL
BUN: 9 mg/dL (ref 6–23)
CO2: 32 mEq/L (ref 19–32)
Calcium: 9.4 mg/dL (ref 8.4–10.5)
Chloride: 100 mEq/L (ref 96–112)
Creatinine, Ser: 0.93 mg/dL (ref 0.40–1.50)
GFR: 89.56 mL/min (ref >60.00)
Glucose, Bld: 143 mg/dL — ABNORMAL HIGH (ref 70–99)
Potassium: 3.5 mEq/L (ref 3.5–5.1)
Sodium: 140 mEq/L (ref 135–145)

## 2020-08-29 NOTE — Assessment & Plan Note (Signed)
Per neurosurgery  

## 2020-08-29 NOTE — Assessment & Plan Note (Signed)
F/u cardiology 

## 2020-08-29 NOTE — Assessment & Plan Note (Signed)
F/u ent

## 2020-08-29 NOTE — Assessment & Plan Note (Signed)
Recheck  Labs

## 2020-08-29 NOTE — Assessment & Plan Note (Signed)
Stable as long as he stays on meds

## 2020-08-30 ENCOUNTER — Ambulatory Visit: Payer: BC Managed Care – PPO | Admitting: Family Medicine

## 2020-08-30 ENCOUNTER — Encounter: Payer: Self-pay | Admitting: Family Medicine

## 2020-08-30 NOTE — Telephone Encounter (Signed)
See labs 

## 2020-09-06 ENCOUNTER — Encounter: Payer: Self-pay | Admitting: Family Medicine

## 2020-09-06 ENCOUNTER — Other Ambulatory Visit: Payer: Self-pay | Admitting: Family Medicine

## 2020-09-06 DIAGNOSIS — F322 Major depressive disorder, single episode, severe without psychotic features: Secondary | ICD-10-CM

## 2020-09-06 NOTE — Telephone Encounter (Signed)
Referral was placed on 3/15

## 2020-09-10 ENCOUNTER — Encounter: Payer: Self-pay | Admitting: Internal Medicine

## 2020-09-10 ENCOUNTER — Ambulatory Visit (INDEPENDENT_AMBULATORY_CARE_PROVIDER_SITE_OTHER): Payer: BC Managed Care – PPO | Admitting: Internal Medicine

## 2020-09-10 ENCOUNTER — Other Ambulatory Visit: Payer: Self-pay

## 2020-09-10 VITALS — BP 138/82 | HR 81 | Ht 69.0 in | Wt 177.6 lb

## 2020-09-10 DIAGNOSIS — E1151 Type 2 diabetes mellitus with diabetic peripheral angiopathy without gangrene: Secondary | ICD-10-CM

## 2020-09-10 DIAGNOSIS — IMO0002 Reserved for concepts with insufficient information to code with codable children: Secondary | ICD-10-CM

## 2020-09-10 DIAGNOSIS — E785 Hyperlipidemia, unspecified: Secondary | ICD-10-CM

## 2020-09-10 DIAGNOSIS — E1165 Type 2 diabetes mellitus with hyperglycemia: Secondary | ICD-10-CM

## 2020-09-10 LAB — POCT GLYCOSYLATED HEMOGLOBIN (HGB A1C): Hemoglobin A1C: 6.7 % — AB (ref 4.0–5.6)

## 2020-09-10 NOTE — Progress Notes (Signed)
Patient ID: Chase [name], male   DOB: 03-27-1961, 60 y.o.   MRN: [id number]  This visit occurred during the SARS-CoV-2 public health emergency.  Safety protocols were in place, including screening questions prior to the visit, additional usage of staff PPE, and extensive cleaning of exam room while observing appropriate contact time as indicated for disinfecting solutions.   HPI: Chase [name] is a 60 y.o.-year-old male, presenting for follow-up for DM2, dx in 2014, non-insulin-dependent, uncontrolled, with complications (PVD, cerebro-vascular ds - s/p "mini"strokes, PN). Last visit 3 months ago.  Interim history: At this visit, he does not have any new complaints.   His leg swelling improved after stopping amlodipine. He could not start Ozempic as prescribed at last visit since this was not covered by his insurance, but he was finally able to start Victoza.  Sugars improved.  He tolerates Victoza well. We tried again to obtain a CGM for him, but this was not covered.  HbA1c levels reviewed: Lab Results  Component Value Date   HGBA1C 8.2 (H) 05/30/2020   HGBA1C 8.3 (H) 05/24/2020   HGBA1C 9.7 (H) 04/05/2020   HGBA1C 8.3 (H) 05/24/2019   HGBA1C 7.9 (A) 08/04/2018   HGBA1C 7.5 (A) 12/21/2017   HGBA1C 7.4 09/17/2017   HGBA1C 7.8 06/19/2017   HGBA1C 7.1 (H) 02/19/2017   HGBA1C 7.4 02/21/2016   HGBA1C 9.8 (H) 11/13/2015   HGBA1C 6.8 (H) 02/11/2015   HGBA1C 6.8 (H) 10/03/2014   HGBA1C 6.6 (H) 05/26/2014   HGBA1C 6.4 06/03/2013   HGBA1C 6.7 (H) 09/17/2012   HGBA1C 6.2 03/22/2012   HGBA1C 6.2 11/03/2011   HGBA1C 5.6 03/27/2006   Pt is on a regimen of:  - Metformin 500 mg 2x a day - Jardiance 25 mg in am  - Januvia 100 mg in am >> Victoza 1.8 mg weekly-started 05/2020 In 11/2018, we tried to change from Januvia to [place] but this was not covered. Stopped Glipizide b/c rash. Stopped Invokana 100 mg in am >> only got this for 1 mo He was on Metformin 500 mg po bid - in liquid form,  as he could not swallow pills. He gets gi upset. He stopped this in 2014.  He is not checking sugars-fingerstick values are not accurate due to Raynaud's syndrome, however, freestyle libre CGM was not approved by his insurance.  Sugars per review of his meter download: - brunch: 130-150 >> 153, 228 >> ? >> 157, 181  >> 111-135, 190 - 2h after brunch:  312 >> 150-220 >> n/c >> 206, 243 >> 119 - before dinner: 216 >> 150-200 >> n/c >> 115 - 2h after dinner:  180-210 >> 260 >> n/c - bedtime: 172-214 >> 208 >> ? >> 164, 173 >> 121 - nighttime: n/c >> 108-138, 151 Lowest sugar was 100s >> 157 >> 111;  he has hypoglycemia awareness in the 70s. Highest sugar was 246 (January) >> 260 >> 190.  Glucometer: [employer] Next >> ?  In the past, he was occasional lemonade and ginger ale >> advised to stop.  -+ Mild CKD, last BUN/creatinine:  Lab Results  Component Value Date   BUN 9 08/28/2020   BUN 8 08/21/2020   CREATININE 0.93 08/28/2020   CREATININE 0.93 08/21/2020   -+ HL.  last set of lipids: Lab Results  Component Value Date   CHOL 122 08/21/2020   HDL 28.50 (L) 08/21/2020   LDLCALC 76 05/30/2020   LDLDIRECT 56.0 08/21/2020   TRIG 249.0 (H) 08/21/2020  CHOLHDL 4 08/21/2020  On pravastatin 40. - last eye exam was in 2021: No DR reportedly.  Dr. Jabier Mutton. 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 He has anxiety, PTSD from childhood. Has varicose veins >> wears compresion hoses. He also has a h/o kidney stones.  On allopurinol for gout.  ROS: Constitutional: no weight gain/no weight loss, no fatigue, no subjective hyperthermia, no subjective hypothermia Eyes: no blurry vision, no xerophthalmia ENT: no sore throat, no nodules palpated in neck, no dysphagia, no odynophagia, no hoarseness Cardiovascular: no CP/no SOB/no palpitations/+ leg swelling Respiratory: no cough/no SOB/no  wheezing Gastrointestinal: no N/no V/no D/no C/no acid reflux Musculoskeletal: no muscle aches/no joint aches Skin: no rashes, no hair loss Neurological: no tremors/+ numbness/+ tingling/no dizziness  I reviewed pt's medications, allergies, PMH, social hx, family hx, and changes were documented in the history of present illness. Otherwise, unchanged from my initial visit note.  Past Medical History:  Diagnosis Date  . Abnormality of gait 11/10/2012  . Acute GI bleeding   . Acute renal insufficiency 02/10/2015  . Acute upper respiratory infection 08/10/2012  . Allergic drug reaction 05/24/2020  . Allergy   . Anemia 02/10/2015  . Annual physical exam 11/26/2011  . 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 Arthralgia 07/25/2010  . Arthritis   . Benign neoplasm of brain ([place]) 05/24/2019  . Bigeminy ? 03/22/2012  . Bilateral hearing loss 11/23/2019  . Bilateral leg and foot pain 03/02/2017  . Blood in stool   . Blood in stool, frank 02/10/2015  . Body mass index (BMI) 26.0-26.9, adult 03/15/2020  . Brain cyst   . Central sleep apnea associated with atrial fibrillation ([place]) 03/12/2018  . Cerebrovascular accident, old 11/16/2014  . Chest discomfort 06/08/2019  . Chest pain   . Chronic cough 08/27/2014   CXR 08/2014:  Mild hyperinflation, no acute process Arlyce Harman 09/2014:  Difficulty with procedure, but essentially normal +CVA's in past, with aspiration risk by history Speech evaluation 2016:     . Chronic left shoulder pain 08/06/2020  . Chronic pain syndrome 11/23/2019  . Chronic pansinusitis 06/30/2018  . 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  . 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 Constipation   . 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 DDD (degenerative disc disease), cervical 04/26/2019  . DDD (degenerative disc disease), lumbar 04/26/2019  . Decreased hearing of both ears 02/12/2013  . Deviated septum 05/31/2018  .  Diabetes mellitus without complication ([place])   . Difficulty urinating   . DM (diabetes mellitus) type II uncontrolled, periph vascular disorder ([place]) 01/02/2016  . Dry skin 05/09/2019  . Dysuria 11/29/2010  . Essential (primary) hypertension 01/26/2015  . Excessive daytime sleepiness 02/11/2018  . FATIGUE 12/09/2007   Qualifier: Diagnosis of  By: Cori Razor RN, Mikal Plane Fibromyalgia 03/02/2017  . Food intolerance 05/09/2019  . GERD 12/09/2007   Qualifier: Diagnosis of  By: Niel Hummer MD, Lorinda Creed   . GERD (gastroesophageal reflux disease)   . GI bleed 02/10/2015  . Headache(784.0)   . Hearing loss   . Hemangioma 02/12/2013  . Hemorrhoid 05/22/2011  . Hemorrhoids 02/10/2015  . HTN (hypertension) 02/04/2010   Qualifier: Diagnosis of  By: Charlett Blake MD, Erline Levine    . Hyperlipidemia   .  Hyperlipidemia LDL goal <70 01/26/2015  . Hypersomnia, persistent 11/05/2012   epworth of 18 points, was placed on CPAP in 2008 with very low AHI ( no number  Mentioned) and struggles ever since with PAP. .    . Hypertension   . Hypotension 07/04/2020  . 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/23/2019  . Insect bite 03/16/2018  . Insomnia 08/05/2011  . Internal and external hemorrhoids without complication 12/15/6376  . Internal hemorrhoids 10/22/2014  . 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 Leg swelling   . Localized swelling of both lower legs 03/02/2017  . Low back pain 07/20/2007   Qualifier: Diagnosis of  By: Niel Hummer MD, Lorinda Creed   . Lower extremity edema 07/04/2020  . Lower GI bleed   . Nasal congestion   . Nasal turbinate hypertrophy 05/31/2018  . Neck pain 03/15/2020  . NEPHROLITHIASIS 08/13/2006   Qualifier: Diagnosis of  By: Beryle Lathe    . Nocturia more than twice per night 02/11/2018  . OSA on CPAP 10/17/2014  . Other chronic pain 03/02/2017  . Other fatigue 07/04/2020  . Pain in finger of right hand 10/06/2012  . Pain in joint of right shoulder 02/21/2017  . Palpitations  01/26/2015  . Paresthesia 07/25/2010  . Persistent headaches 10/17/2014  . 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   Qualifier: Diagnosis of  By: Niel Hummer MD, [place] PONV (postoperative nausea and vomiting)   . Preoperative clearance 07/04/2020  . Prolapsed hemorrhoids 10/05/2013  . Radiculopathy, lumbar region 03/15/2020  . Rash 05/24/2019  . Raynaud's disease 03/02/2017  . Raynaud's phenomenon 03/02/2017  . Rectal bleeding 02/12/2015  . Rectal pain   . Rectal ulcer with bleeding after hemorrhoid banding 02/11/2015  . Rhinitis, chronic 12/23/2006   Qualifier: Diagnosis of  By: Cori Razor RN, Mikal Plane Right shoulder pain 02/15/2015  . Seasonal and perennial allergic rhinitis 05/09/2019  . Sinus infection 06/30/2014  . Sinusitis, acute, maxillary 02/02/2014  . Sleep apnea   . Sleep apnea with use of continuous positive airway pressure (CPAP) 02/11/2018  . SOB (shortness of breath) 07/04/2020  . Stroke ([place])   . Stroke ([place])   . Subacute confusional state 10/17/2014  . SYMPTOM, HYPERSOMNIA NOS 02/19/2007   Qualifier: Diagnosis of  By: [place] Dome    . Thrombosed hemorrhoids   . Tick bite of back 11/23/2019  . Trouble swallowing   . Unspecified sleep apnea 11/05/2012  . Varicose veins of lower extremities with other complications 5/88/5027  . Vertigo 03/19/2012   Past Surgical History:  Procedure Laterality Date  . CATARACT EXTRACTION     x 3  . [place]   cataracts  . FLEXIBLE SIGMOIDOSCOPY N/A 02/11/2015   Procedure: FLEXIBLE SIGMOIDOSCOPY;  Surgeon: Gatha Mayer, MD;  Location: [facility];  Service: Endoscopy;  Laterality: N/A;  . INNER EAR SURGERY     rt ear  . INNER EAR SURGERY  1992  . surgery - right arm     1983   Social History   Socioeconomic History  . Marital status: Single    Spouse name: Not on file  . Number of children: 0  . Years of education: Not on file  . Highest education level: Not on file  Occupational History  . Not on file  Tobacco Use  .  Smoking status: Never Smoker  . Smokeless tobacco: Never Used  Vaping Use  . Vaping Use: Never used  Substance and Sexual Activity  . Alcohol use: No    Alcohol/week: 0.0 standard drinks    Comment: none  . Drug use: No  . Sexual activity: Never  Other Topics Concern  . Not on file  Social History Narrative   No caffeine intake except for chocolate.  Exercised-walking   Social Determinants of Radio broadcast assistant Strain: Not on file  Food Insecurity: Not on file  Transportation Needs: Not on file  Physical Activity: Not on file  Stress: Not on file  Social Connections: Not on file  Intimate Partner Violence: Not on file   Current Outpatient Medications on File Prior to Visit  Medication Sig Dispense Refill  . allopurinol (ZYLOPRIM) 100 MG tablet Take 100 mg by mouth 2 (two) times daily.    Marland Kitchen azelastine (ASTELIN) 0.1 % nasal spray Place 2 sprays into both nostrils 2 (two) times daily.    . budesonide (PULMICORT) 0.5 MG/2ML nebulizer solution MIX WITH 240 CC SALINE IN THE NEILMED BOTTLE AS DIRECTED AND RINSE SINUSES TWICE A DAY. 360 mL 1  . clotrimazole-betamethasone (LOTRISONE) cream APPLY TO AFFECTED AREA TWICE A DAY 30 g 0  . cyclobenzaprine (FLEXERIL) 10 MG tablet TAKE 1 TABLET BY MOUTH AT BEDTIME AS NEEDED FOR MUSCLE SPASMS. 30 tablet 1  . cyproheptadine (PERIACTIN) 4 MG tablet TAKE 1 TABLET (4 MG TOTAL) BY MOUTH AT BEDTIME. 90 tablet 1  . empagliflozin (JARDIANCE) 25 MG TABS tablet Take 25 mg by mouth daily. 30 tablet 6  . EPINEPHrine 0.3 mg/0.3 mL IJ SOAJ injection Inject 0.3 mg into the muscle as needed for anaphylaxis.    . famotidine (PEPCID) 40 MG/5ML suspension TAKE 2.5 MLS (20 MG TOTAL) BY MOUTH DAILY. 50 mL 5  . FIBER SELECT GUMMIES PO Take 1 tablet by mouth daily.    . finasteride (PROSCAR) 5 MG tablet Take 1 tablet (5 mg total) by mouth daily. 90 tablet 3  . fluticasone (CUTIVATE) 0.05 % cream Apply 1 application topically 2 (two) times daily.    Marland Kitchen  gabapentin (NEURONTIN) 100 MG capsule TAKE 1 CAPSULE BY MOUTH THREE TIMES A DAY 90 capsule 3  . hydrOXYzine (ATARAX/VISTARIL) 25 MG tablet TAKE 1 TABLET BY MOUTH EVERY 8 HOURS AS NEEDED FOR ANXIETY OR ITCHING 270 tablet 1  . liraglutide (VICTOZA) 18 MG/3ML SOPN Inject under skin 1.8 mg once a day before breakfast 9 mL 3  . losartan (COZAAR) 100 MG tablet Take 1 tablet (100 mg total) by mouth daily. 90 tablet 3  . metFORMIN (GLUCOPHAGE) 500 MG tablet TAKE 1 TABLET BY MOUTH 2 TIMES DAILY WITH A MEAL. 180 tablet 1  . metoprolol tartrate (LOPRESSOR) 100 MG tablet Take 100 mg by mouth daily.    . Multiple Vitamins-Minerals (AIR[place]ORNE GUMMIES PO) Take 1 each by mouth daily.    . polyethylene glycol powder (GLYCOLAX/MIRALAX) 17 GM/SCOOP powder Take 17 g by mouth daily as needed for constipation.    . pravastatin (PRAVACHOL) 40 MG tablet Take 40 mg by mouth 2 (two) times daily.    Marland Kitchen terazosin (HYTRIN) 5 MG capsule Take 5 mg by mouth daily.    Marland Kitchen venlafaxine XR (EFFEXOR-XR) 75 MG 24 hr capsule TAKE 1 CAPSULE (75 MG TOTAL) BY MOUTH DAILY WITH BREAKFAST. 90 capsule 1  . potassium chloride SA (KLOR-CON) 20 MEQ tablet Take 2 tablets (40 mEq total) by mouth daily. (Patient not taking: Reported on 09/10/2020) 60 tablet 3   No current facility-administered medications on file prior to visit.  Allergies  Allergen Reactions  . Amoxicillin Hives, Itching and Other (See Comments)    White tongue; ? hives  . Sulfa Antibiotics Hives and Rash  . Terfenadine     ? reaction   Family History  Problem Relation Age of Onset  . Breast cancer Mother 32       breast  . Huntington's disease Mother   . Stroke Father   . Heart disease Father   . Hypertension Father   . Heart attack Father   . Hypertension Brother   . Hyperlipidemia Neg Hx   . Diabetes Neg Hx    PE: BP 138/82 (BP Location: Right Arm, Patient Position: Sitting, Cuff Size: Normal)   Pulse 81   Ht 5\' 9"  (1.753 m)   Wt 177 lb 9.6 oz (80.6 kg)   SpO2  98%   BMI 26.23 kg/m  Wt Readings from Last 3 Encounters:  09/10/20 177 lb 9.6 oz (80.6 kg)  08/28/20 177 lb 3.2 oz (80.4 kg)  08/08/20 179 lb (81.2 kg)   Constitutional: Slightly overweight, in NAD Eyes: PERRLA, EOMI, no exophthalmos ENT: moist mucous membranes, no thyromegaly, no cervical lymphadenopathy Cardiovascular: RRR, No MRG, + mild swelling R LE  Respiratory: CTA B Gastrointestinal: abdomen soft, NT, ND, BS+ Musculoskeletal: no deformities, strength intact in all 4 Skin: moist, warm, no rashes Neurological: no tremor with outstretched hands, DTR normal in all 4  ASSESSMENT: 1. DM2, non-insulin-dependent, uncontrolled, with complications - PVD - cerebro-vascular ds - s/p "mini"strokes - PN  2. HL  PLAN:  1. Patient with history of uncontrolled type 2 diabetes, on Metformin, SGLT2 inhibitor, and now also daily GLP-1 receptor agonist started after last visit. Ozempic was not covered, but we were able to start Victoza.  He stopped Januvia when he started Victoza.  Sugars improved on this regimen and they are mostly at goal with only few mildly hyperglycemic values in a.m. - We tried very hard to obtain a CGM morning since patient has Raynaud's phenomenon and he could not check his sugars frequently but this was not covered for him. -At today's visit, due to the significant improvement in the blood sugars and the fact that he does not have any low blood sugars or significant highs, we discussed about continuing the same regimen. - I suggested to:  Patient Instructions  Please continue:  - Metformin 500 mg 2x a day - Jardiance 25 mg in am  - Victoza 1.8 mg weekly  Please come back for a follow-up appointment in 4 months.  - we checked his HbA1c: 6.7% (better) - advised to check sugars at different times of the day - 1-2x a day, rotating check times - advised for yearly eye exams >> he is UTD - return to clinic in 4 months     2. HL -Reviewed latest lipid panel from  earlier this month: LDL at goal, triglycerides high, HDL low: Lab Results  Component Value Date   CHOL 122 08/21/2020   HDL 28.50 (L) 08/21/2020   LDLCALC 76 05/30/2020   LDLDIRECT 56.0 08/21/2020   TRIG 249.0 (H) 08/21/2020   CHOLHDL 4 08/21/2020  -Continues Pravachol 40 mg daily without side effects  Philemon Kingdom, MD PhD [facility] Endocrinology

## 2020-09-10 NOTE — Patient Instructions (Addendum)
Please continue:  - Metformin 500 mg 2x a day - Jardiance 25 mg in am  - Victoza 1.8 mg weekly  Please come back for a follow-up appointment in 4 months.

## 2020-09-15 ENCOUNTER — Other Ambulatory Visit: Payer: Self-pay | Admitting: Internal Medicine

## 2020-09-17 ENCOUNTER — Other Ambulatory Visit: Payer: Self-pay

## 2020-09-17 DIAGNOSIS — H9190 Unspecified hearing loss, unspecified ear: Secondary | ICD-10-CM | POA: Insufficient documentation

## 2020-09-17 DIAGNOSIS — K5900 Constipation, unspecified: Secondary | ICD-10-CM | POA: Insufficient documentation

## 2020-09-18 ENCOUNTER — Other Ambulatory Visit: Payer: Self-pay

## 2020-09-18 ENCOUNTER — Encounter: Payer: Self-pay | Admitting: Cardiology

## 2020-09-18 ENCOUNTER — Ambulatory Visit (INDEPENDENT_AMBULATORY_CARE_PROVIDER_SITE_OTHER): Payer: BC Managed Care – PPO | Admitting: Cardiology

## 2020-09-18 ENCOUNTER — Other Ambulatory Visit: Payer: Self-pay | Admitting: Family Medicine

## 2020-09-18 VITALS — BP 142/86 | HR 98 | Ht 69.0 in | Wt 176.0 lb

## 2020-09-18 DIAGNOSIS — E785 Hyperlipidemia, unspecified: Secondary | ICD-10-CM

## 2020-09-18 DIAGNOSIS — E1165 Type 2 diabetes mellitus with hyperglycemia: Secondary | ICD-10-CM

## 2020-09-18 DIAGNOSIS — E1151 Type 2 diabetes mellitus with diabetic peripheral angiopathy without gangrene: Secondary | ICD-10-CM | POA: Diagnosis not present

## 2020-09-18 DIAGNOSIS — IMO0002 Reserved for concepts with insufficient information to code with codable children: Secondary | ICD-10-CM

## 2020-09-18 DIAGNOSIS — I10 Essential (primary) hypertension: Secondary | ICD-10-CM

## 2020-09-18 DIAGNOSIS — M47812 Spondylosis without myelopathy or radiculopathy, cervical region: Secondary | ICD-10-CM | POA: Diagnosis not present

## 2020-09-18 DIAGNOSIS — M5030 Other cervical disc degeneration, unspecified cervical region: Secondary | ICD-10-CM | POA: Diagnosis not present

## 2020-09-18 DIAGNOSIS — G894 Chronic pain syndrome: Secondary | ICD-10-CM | POA: Diagnosis not present

## 2020-09-18 DIAGNOSIS — M5134 Other intervertebral disc degeneration, thoracic region: Secondary | ICD-10-CM | POA: Diagnosis not present

## 2020-09-18 MED ORDER — AMLODIPINE BESYLATE 2.5 MG PO TABS: 2.5000 mg | ORAL_TABLET | Freq: Every day | ORAL | 3 refills | Status: DC

## 2020-09-18 NOTE — Progress Notes (Signed)
Cardiology Office Note:    Date:  09/18/2020   ID:  Chase [name], DOB 08/17/1960, MRN [id number]  PCP:  Ann Held, DO  Cardiologist:  Jenean Lindau, MD   Referring MD: Carollee Herter, Alferd Apa, *    ASSESSMENT:    1. Primary hypertension   2. Hyperlipidemia LDL goal <70   3. DM (diabetes mellitus) type II uncontrolled, periph vascular disorder ([place])    PLAN:    In order of problems listed above:  1. Primary prevention stressed with the patient.  Importance of compliance with diet medication stressed any vocalized understanding. 2. Essential hypertension: Blood pressure is elevated and he is concerned about it.  He has brought blood pressure readings which are similar and he wants his blood pressure lower.  I will add amlodipine again 2.5 mg to his regimen.  Hopefully this will not give him any significant pedal edema.  He is willing to do this.  Diet, lifestyle modification and salt intake issues were discussed. 3. Diabetes mellitus: Managed by primary care provider and diet was emphasized. 4. Mixed dyslipidemia: Managed by primary care.  Patient on statin therapy.  Lipids followed by primary care.  Diet emphasized. 5. Patient will be seen in follow-up appointment in 2 months or earlier if the patient has any concerns    Medication Adjustments/Labs and Tests Ordered: Current medicines are reviewed at length with the patient today.  Concerns regarding medicines are outlined above.  No orders of the defined types were placed in this encounter.  Meds ordered this encounter  Medications  . amLODipine (NORVASC) 2.5 MG tablet    Sig: Take 1 tablet (2.5 mg total) by mouth daily.    Dispense:  180 tablet    Refill:  3     No chief complaint on file.    History of Present Illness:    Chase [name] is a 60 y.o. male.  Patient has past medical history of essential hypertension dyslipidemia and diabetes mellitus.  He denies any problems at this time and takes care of  activities of daily living.  No chest pain orthopnea or PND.  At the time of my evaluation, the patient is alert awake oriented and in no distress.  Past Medical History:  Diagnosis Date  . Abnormality of gait 11/10/2012  . Acute GI bleeding   . Acute renal insufficiency 02/10/2015  . Acute upper respiratory infection 08/10/2012  . Allergic drug reaction 05/24/2020  . Allergy   . Anemia 02/10/2015  . Annual physical exam 11/26/2011  . 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 Arthralgia 07/25/2010  . Arthritis   . Benign neoplasm of brain ([place]) 05/24/2019  . Bigeminy ? 03/22/2012  . Bilateral hearing loss 11/23/2019  . Bilateral leg and foot pain 03/02/2017  . Blood in stool   . Blood in stool, frank 02/10/2015  . Body mass index (BMI) 26.0-26.9, adult 03/15/2020  . BPH without urinary obstruction 08/29/2020  . Brain cyst   . 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 Cerebrovascular accident, old 11/16/2014  . Chest discomfort 06/08/2019  . Chest pain   . Chronic cough 08/27/2014   CXR 08/2014:  Mild hyperinflation, no acute process Arlyce Harman 09/2014:  Difficulty with procedure, but essentially normal +CVA's in past, with aspiration risk by history Speech evaluation 2016:     . Chronic  left shoulder pain 08/06/2020  . Chronic pain syndrome 11/23/2019  . Chronic pansinusitis 06/30/2018  . 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  . 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 Constipation   . Constipation   . 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 DDD (degenerative disc disease), cervical 04/26/2019  . DDD (degenerative disc disease), lumbar 04/26/2019  . Decreased hearing of both ears 02/12/2013  . Deviated septum 05/31/2018  . Diabetes mellitus without complication ([place])   . Difficulty urinating   . DM (diabetes mellitus) type II uncontrolled, periph vascular disorder ([place]) 01/02/2016  . Dry skin  05/09/2019  . Dysuria 11/29/2010  . Essential (primary) hypertension 01/26/2015  . Excessive daytime sleepiness 02/11/2018  . FATIGUE 12/09/2007   Qualifier: Diagnosis of  By: Cori Razor RN, Mikal Plane Fibromyalgia 03/02/2017  . Food intolerance 05/09/2019  . GERD 12/09/2007   Qualifier: Diagnosis of  By: Niel Hummer MD, Lorinda Creed   . GERD (gastroesophageal reflux disease)   . GI bleed 02/10/2015  . Headache(784.0)   . Hearing loss   . Hearing loss   . Hemangioma 02/12/2013  . Hemorrhoid 05/22/2011  . Hemorrhoids 02/10/2015  . HTN (hypertension) 02/04/2010   Qualifier: Diagnosis of  By: Charlett Blake MD, Erline Levine    . Hyperlipidemia   . Hyperlipidemia LDL goal <70 01/26/2015  . Hypersomnia, persistent 11/05/2012   epworth of 18 points, was placed on CPAP in 2008 with very low AHI ( no number  Mentioned) and struggles ever since with PAP. .    . Hypertension   . Hypokalemia 08/29/2020  . Hypotension 07/04/2020  . 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/23/2019  . Insect bite 03/16/2018  . Insomnia 08/05/2011  . Internal and external hemorrhoids without complication 1/32/4401  . Internal hemorrhoids 10/22/2014  . 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 Leg swelling   . Localized swelling of both lower legs 03/02/2017  . Low back pain 07/20/2007   Qualifier: Diagnosis of  By: Niel Hummer MD, Lorinda Creed   . Lower extremity edema 07/04/2020  . Lower GI bleed   . Nasal congestion   . Nasal turbinate hypertrophy 05/31/2018  . Neck pain 03/15/2020  . NEPHROLITHIASIS 08/13/2006   Qualifier: Diagnosis of  By: Beryle Lathe    . Nocturia more than twice per night 02/11/2018  . OSA on CPAP 10/17/2014  . Other chronic pain 03/02/2017  . Other fatigue 07/04/2020  . Pain in finger of right hand 10/06/2012  . Pain in joint of right shoulder 02/21/2017  . Palpitations 01/26/2015  . Paresthesia 07/25/2010  . Persistent atrial fibrillation ([place]) 08/29/2020  . Persistent headaches 10/17/2014  . 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 Preoperative clearance 07/04/2020  . Prolapsed hemorrhoids 10/05/2013  . Radiculopathy, lumbar region 03/15/2020  . Rash 05/24/2019  . Raynaud's disease 03/02/2017  . Raynaud's phenomenon 03/02/2017  . Rectal bleeding 02/12/2015  . Rectal pain   . Rectal ulcer with bleeding after hemorrhoid banding 02/11/2015  . Rhinitis, chronic 12/23/2006   Qualifier: Diagnosis of  By: Cori Razor RN, Mikal Plane Right shoulder pain 02/15/2015  . Seasonal and perennial allergic rhinitis 05/09/2019  . Severe episode of recurrent major depressive disorder, without psychotic features ([place]) 08/29/2020  . Sinus infection 06/30/2014  . Sinusitis, acute, maxillary 02/02/2014  . Sleep apnea   . Sleep apnea with use  of continuous positive airway pressure (CPAP) 02/11/2018  . SOB (shortness of breath) 07/04/2020  . Stroke ([place])   . Stroke ([place])   . Subacute confusional state 10/17/2014  . SYMPTOM, HYPERSOMNIA NOS 02/19/2007   Qualifier: Diagnosis of  By: [place] Dome    . Thrombosed hemorrhoids   . Tick bite of back 11/23/2019  . Tinnitus of both ears 08/29/2020  . Trouble swallowing   . Unspecified sleep apnea 11/05/2012  . Varicose veins of lower extremities with other complications 12/29/9676  . Vertigo 03/19/2012    Past Surgical History:  Procedure Laterality Date  . CATARACT EXTRACTION     x 3  . [place]   cataracts  . FLEXIBLE SIGMOIDOSCOPY N/A 02/11/2015   Procedure: FLEXIBLE SIGMOIDOSCOPY;  Surgeon: Gatha Mayer, MD;  Location: [facility];  Service: Endoscopy;  Laterality: N/A;  . INNER EAR SURGERY     rt ear  . INNER EAR SURGERY  1992  . surgery - right arm     1983    Current Medications: Current Meds  Medication Sig  . allopurinol (ZYLOPRIM) 100 MG tablet Take 100 mg by mouth 2 (two) times daily.  Marland Kitchen amLODipine (NORVASC) 2.5 MG tablet Take 1 tablet (2.5 mg  total) by mouth daily.  Marland Kitchen azelastine (ASTELIN) 0.1 % nasal spray Place 2 sprays into both nostrils 2 (two) times daily.  . clotrimazole-betamethasone (LOTRISONE) cream Apply 1 application topically 2 (two) times daily.  . cyclobenzaprine (FLEXERIL) 10 MG tablet Take 10 mg by mouth as needed for muscle spasms.  . cyproheptadine (PERIACTIN) 4 MG tablet Take 4 mg by mouth at bedtime.  . empagliflozin (JARDIANCE) 25 MG TABS tablet Take 25 mg by mouth daily.  Marland Kitchen EPINEPHrine 0.3 mg/0.3 mL IJ SOAJ injection Inject 0.3 mg into the muscle as needed for anaphylaxis.  . famotidine (PEPCID) 40 MG/5ML suspension Take 2.5 mLs by mouth daily.  Marland Kitchen FIBER SELECT GUMMIES PO Take 1 tablet by mouth daily.  . finasteride (PROSCAR) 5 MG tablet Take 1 tablet (5 mg total) by mouth daily.  . fluticasone (CUTIVATE) 0.05 % cream Apply 1 application topically 2 (two) times daily.  Marland Kitchen gabapentin (NEURONTIN) 100 MG capsule Take 100 mg by mouth 3 (three) times daily.  Marland Kitchen HYDROcodone-acetaminophen (HYCET) 7.5-325 mg/15 ml solution Take 15 mLs by mouth 4 (four) times daily as needed for pain.  . hydrOXYzine (ATARAX/VISTARIL) 25 MG tablet Take 25 mg by mouth as needed for itching or anxiety.  . liraglutide (VICTOZA) 18 MG/3ML SOPN Inject under skin 1.8 mg once a day before breakfast  . losartan (COZAAR) 100 MG tablet Take 1 tablet (100 mg total) by mouth daily.  . metFORMIN (GLUCOPHAGE) 500 MG tablet Take by mouth 2 (two) times daily with a meal.  . metoprolol tartrate (LOPRESSOR) 100 MG tablet Take 100 mg by mouth daily.  . metoprolol tartrate (LOPRESSOR) 25 MG tablet Take 25 mg by mouth 2 (two) times daily.  . Multiple Vitamins-Minerals (AIRBORNE GUMMIES PO) Take 1 each by mouth daily.  . polyethylene glycol powder (GLYCOLAX/MIRALAX) 17 GM/SCOOP powder Take 17 g by mouth daily as needed for constipation.  . potassium chloride 20 MEQ/15ML (10%) SOLN Take 30 mLs by mouth daily.  . pravastatin (PRAVACHOL) 40 MG tablet Take 40 mg by  mouth 2 (two) times daily.  Marland Kitchen PROCTOFOAM HC rectal foam Place 1 applicator rectally as needed for hemorrhoids.  Marland Kitchen terazosin (HYTRIN) 5 MG capsule Take 5 mg by mouth daily.  Marland Kitchen  venlafaxine XR (EFFEXOR-XR) 75 MG 24 hr capsule Take 75 mg by mouth daily with breakfast.     Allergies:   Amoxicillin, Sulfa antibiotics, and Terfenadine   Social History   Socioeconomic History  . Marital status: Single    Spouse name: Not on file  . Number of children: 0  . Years of education: Not on file  . Highest education level: Not on file  Occupational History  . Not on file  Tobacco Use  . Smoking status: Never Smoker  . Smokeless tobacco: Never Used  Vaping Use  . Vaping Use: Never used  Substance and Sexual Activity  . Alcohol use: No    Alcohol/week: 0.0 standard drinks    Comment: none  . Drug use: No  . Sexual activity: Never  Other Topics Concern  . Not on file  Social History Narrative   No caffeine intake except for chocolate.  Exercised-walking   Social Determinants of Radio broadcast assistant Strain: Not on file  Food Insecurity: Not on file  Transportation Needs: Not on file  Physical Activity: Not on file  Stress: Not on file  Social Connections: Not on file     Family History: The patient's family history includes Breast cancer (age of onset: 18) in his mother; Heart attack in his father; Heart disease in his father; Huntington's disease in his mother; Hypertension in his brother and father; Stroke in his father. There is no history of Hyperlipidemia or Diabetes.  ROS:   Please see the history of present illness.    All other systems reviewed and are negative.  EKGs/Labs/Other Studies Reviewed:    The following studies were reviewed today: I discussed my findings with the patient at length.   Recent Labs: 07/03/2020: Hemoglobin 13.8; Platelets 287.0; TSH 4.40 08/21/2020: ALT 28 08/28/2020: BUN 9; Creatinine, Ser 0.93; Potassium 3.5; Sodium 140  Recent Lipid  Panel    Component Value Date/Time   CHOL 122 08/21/2020 0919   TRIG 249.0 (H) 08/21/2020 0919   HDL 28.50 (L) 08/21/2020 0919   CHOLHDL 4 08/21/2020 0919   VLDL 49.8 (H) 08/21/2020 0919   LDLCALC 76 05/30/2020 1504   LDLDIRECT 56.0 08/21/2020 0919    Physical Exam:    VS:  BP (!) 142/86   Pulse 98   Ht 5\' 9"  (1.753 m)   Wt 176 lb (79.8 kg)   SpO2 97%   BMI 25.99 kg/m     Wt Readings from Last 3 Encounters:  09/18/20 176 lb (79.8 kg)  09/10/20 177 lb 9.6 oz (80.6 kg)  08/28/20 177 lb 3.2 oz (80.4 kg)     GEN: Patient is in no acute distress HEENT: Normal NECK: No JVD; No carotid bruits LYMPHATICS: No lymphadenopathy CARDIAC: Hear sounds regular, 2/6 systolic murmur at the apex. RESPIRATORY:  Clear to auscultation without rales, wheezing or rhonchi  ABDOMEN: Soft, non-tender, non-distended MUSCULOSKELETAL:  No edema; No deformity  SKIN: Warm and dry NEUROLOGIC:  Alert and oriented x 3 PSYCHIATRIC:  Normal affect   Signed, Jenean Lindau, MD  09/18/2020 5:21 PM    [place]

## 2020-09-18 NOTE — Patient Instructions (Addendum)
Medication Instructions:  Your physician has recommended you make the following change in your medication:   Take Amlodipine 2.5 mg daily.  *If you need a refill on your cardiac medications before your next appointment, please call your pharmacy*   Lab Work: None ordered If you have labs (blood work) drawn today and your tests are completely normal, you will receive your results only by: Marland Kitchen MyChart Message (if you have MyChart) OR . A paper copy in the mail If you have any lab test that is abnormal or we need to change your treatment, we will call you to review the results.   Testing/Procedures: None ordered   Follow-Up: At [facility], you and your health needs are our priority.  As part of our continuing mission to provide you with exceptional heart care, we have created designated Provider Care Teams.  These Care Teams include your primary Cardiologist (physician) and Advanced Practice Providers (APPs -  Physician Assistants and Nurse Practitioners) who all work together to provide you with the care you need, when you need it.  We recommend signing up for the patient portal called "MyChart".  Sign up information is provided on this After Visit Summary.  MyChart is used to connect with patients for Virtual Visits (Telemedicine).  Patients are able to view lab/test results, encounter notes, upcoming appointments, etc.  Non-urgent messages can be sent to your provider as well.   To learn more about what you can do with MyChart, go to [email or web address].    Your next appointment:   4 month(s)  The format for your next appointment:   In Person  Provider:   Jyl Heinz, MD   Other Instructions Amlodipine Tablets What is this medicine? AMLODIPINE (am LOE di peen) is a calcium channel blocker. It relaxes your blood vessels and decreases the amount of work the heart has to do. It treats high blood pressure and/or prevents chest pain (also called angina). This medicine may  be used for other purposes; ask your health care provider or pharmacist if you have questions. COMMON BRAND NAME(S): Norvasc What should I tell my health care provider before I take this medicine? They need to know if you have any of these conditions:  heart disease  liver disease  an unusual or allergic reaction to amlodipine, other drugs, foods, dyes, or preservatives  pregnant or trying to get pregnant  breast-feeding How should I use this medicine? Take this medicine by mouth. Take it as directed on the prescription label at the same time every day. You can take it with or without food. If it upsets your stomach, take it with food. Keep taking it unless your health care provider tells you to stop. Talk to your health care provider about the use of this medicine in children. While it may be prescribed for children as young as 6 for selected conditions, precautions do apply. Overdosage: If you think you have taken too much of this medicine contact a poison control center or emergency room at once. NOTE: This medicine is only for you. Do not share this medicine with others. What if I miss a dose? If you miss a dose, take it as soon as you can. If it is almost time for your next dose, take only that dose. Do not take double or extra doses. What may interact with this medicine? This medicine may interact with the following medications:  clarithromycin  cyclosporine  diltiazem  itraconazole  simvastatin  tacrolimus This list may not describe all  possible interactions. Give your health care provider a list of all the medicines, herbs, non-prescription drugs, or dietary supplements you use. Also tell them if you smoke, drink alcohol, or use illegal drugs. Some items may interact with your medicine. What should I watch for while using this medicine? Visit your health care provider for regular checks on your progress. Check your blood pressure as directed. Ask your health care provider  what your blood pressure should be. Also, find out when you should contact him or her. Do not treat yourself for coughs, colds, or pain while you are using this medicine without asking your health care provider for advice. Some medicines may increase your blood pressure. You may get drowsy or dizzy. Do not drive, use machinery, or do anything that needs mental alertness until you know how this medicine affects you. Do not stand up or sit up quickly, especially if you are an older patient. This reduces the risk of dizzy or fainting spells. Alcohol can make you more drowsy and dizzy. Avoid alcoholic drinks. What side effects may I notice from receiving this medicine? Side effects that you should report to your doctor or health care provider as soon as possible:  allergic reactions (skin rash, itching or hives; swelling of the face, lips, or tongue)  heart attack (trouble breathing; pain or tightness in the chest, neck, back or arms; unusually weak or tired)  low blood pressure (dizziness; feeling faint or lightheaded, falls; unusually weak or tired) Side effects that usually do not require medical attention (report these to your doctor or health care provider if they continue or are bothersome):  facial flushing  nausea  palpitations  stomach pain  sudden weight gain  swelling of the ankles, feet, hands This list may not describe all possible side effects. Call your doctor for medical advice about side effects. You may report side effects to FDA at 1-800-FDA-1088. Where should I keep my medicine? Keep out of the reach of children and pets. Store at room temperature between 20 and 25 degrees C (68 and 77 degrees F). Protect from light and moisture. Keep the container tightly closed. Get rid of any unused medicine after the expiration date. To get rid of medicines that are no longer needed or have expired:  Take the medicine to a medicine take-back program. Check with your pharmacy or law  enforcement to find a location.  If you cannot return the medicine, check the label or package insert to see if the medicine should be thrown out in the garbage or flushed down the toilet. If you are not sure, ask your health care provider. If it is safe to put in the trash, empty the medicine out of the container. Mix the medicine with cat litter, dirt, coffee grounds, or other unwanted substance. Seal the mixture in a bag or container. Put it in the trash. NOTE: This sheet is a summary. It may not cover all possible information. If you have questions about this medicine, talk to your doctor, pharmacist, or health care provider.  2021 Elsevier/Gold Standard (2020-04-28 14:59:47)   Fat and Cholesterol Restricted Eating Plan Getting too much fat and cholesterol in your diet may cause health problems. Choosing the right foods helps keep your fat and cholesterol at normal levels. This can keep you from getting certain diseases.  What are tips for following this plan? Meal planning  At meals, divide your plate into four equal parts: ? Fill one-half of your plate with vegetables and green salads. ?  Fill one-fourth of your plate with whole grains. ? Fill one-fourth of your plate with low-fat (lean) protein foods.  Eat fish that is high in omega-3 fats at least two times a week. This includes mackerel, tuna, sardines, and salmon.  Eat foods that are high in fiber, such as whole grains, beans, apples, broccoli, carrots, peas, and barley. General tips  Work with your doctor to lose weight if you need to.  Avoid: ? Foods with added sugar. ? Fried foods. ? Foods with partially hydrogenated oils.  Limit alcohol intake to no more than 1 drink a day for nonpregnant women and 2 drinks a day for men. One drink equals 12 oz of beer, 5 oz of wine, or 1 oz of hard liquor.   Reading food labels  Check food labels for: ? Trans fats. ? Partially hydrogenated oils. ? Saturated fat (g) in each  serving. ? Cholesterol (mg) in each serving. ? Fiber (g) in each serving.  Choose foods with healthy fats, such as: ? Monounsaturated fats. ? Polyunsaturated fats. ? Omega-3 fats.  Choose grain products that have whole grains. Look for the word "whole" as the first word in the ingredient list. Cooking  Cook foods using low-fat methods. These include baking, boiling, grilling, and broiling.  Eat more home-cooked foods. Eat at restaurants and buffets less often.  Avoid cooking using saturated fats, such as butter, cream, palm oil, palm kernel oil, and coconut oil. Recommended foods Fruits  All fresh, canned (in natural juice), or frozen fruits. Vegetables  Fresh or frozen vegetables (raw, steamed, roasted, or grilled). Green salads. Grains  Whole grains, such as whole wheat or whole grain breads, crackers, cereals, and pasta. Unsweetened oatmeal, bulgur, barley, quinoa, or brown rice. Corn or whole wheat flour tortillas. Meats and other protein foods  Ground beef (85% or leaner), grass-fed beef, or beef trimmed of fat. Skinless chicken or [place]. Ground chicken or [place]. Pork trimmed of fat. All fish and seafood. Egg whites. Dried beans, peas, or lentils. Unsalted nuts or seeds. Unsalted canned beans. Nut butters without added sugar or oil. Dairy  Low-fat or nonfat dairy products, such as skim or 1% milk, 2% or reduced-fat cheeses, low-fat and fat-free ricotta or cottage cheese, or plain low-fat and nonfat yogurt. Fats and oils  Tub margarine without trans fats. Light or reduced-fat mayonnaise and salad dressings. Avocado. Olive, canola, sesame, or safflower oils. The items listed above may not be a complete list of foods and beverages you can eat. Contact a dietitian for more information.   Foods to avoid Fruits  Canned fruit in heavy syrup. Fruit in cream or butter sauce. Fried fruit. Vegetables  Vegetables cooked in cheese, cream, or butter sauce. Fried  vegetables. Grains  White bread. White pasta. White rice. Cornbread. Bagels, pastries, and croissants. Crackers and snack foods that contain trans fat and hydrogenated oils. Meats and other protein foods  Fatty cuts of meat. Ribs, chicken wings, bacon, sausage, bologna, salami, chitterlings, fatback, hot dogs, bratwurst, and packaged lunch meats. Liver and organ meats. Whole eggs and egg yolks. Chicken and [place] with skin. Fried meat. Dairy  Whole or 2% milk, cream, half-and-half, and cream cheese. Whole milk cheeses. Whole-fat or sweetened yogurt. Full-fat cheeses. Nondairy creamers and whipped toppings. Processed cheese, cheese spreads, and cheese curds. Beverages  Alcohol. Sugar-sweetened drinks such as sodas, lemonade, and fruit drinks. Fats and oils  Butter, stick margarine, lard, shortening, ghee, or bacon fat. Coconut, palm kernel, and palm oils. Sweets and desserts  Corn syrup, sugars, honey, and molasses. Candy. Jam and jelly. Syrup. Sweetened cereals. Cookies, pies, cakes, donuts, muffins, and ice cream. The items listed above may not be a complete list of foods and beverages you should avoid. Contact a dietitian for more information. Summary  Choosing the right foods helps keep your fat and cholesterol at normal levels. This can keep you from getting certain diseases.  At meals, fill one-half of your plate with vegetables and green salads.  Eat high-fiber foods, like whole grains, beans, apples, carrots, peas, and barley.  Limit added sugar, saturated fats, alcohol, and fried foods. This information is not intended to replace advice given to you by your health care provider. Make sure you discuss any questions you have with your health care provider. Document Revised: 10/05/2019 Document Reviewed: 10/05/2019 Elsevier Patient Education  2021 [employer].    [email or web address]">  DASH Eating Plan DASH stands for Dietary  Approaches to Stop Hypertension. The DASH eating plan is a healthy eating plan that has been shown to:  Reduce high blood pressure (hypertension).  Reduce your risk for type 2 diabetes, heart disease, and stroke.  Help with weight loss. What are tips for following this plan? Reading food labels  Check food labels for the amount of salt (sodium) per serving. Choose foods with less than 5 percent of the Daily Value of sodium. Generally, foods with less than 300 milligrams (mg) of sodium per serving fit into this eating plan.  To find whole grains, look for the word "whole" as the first word in the ingredient list. Shopping  Buy products labeled as "low-sodium" or "no salt added."  Buy fresh foods. Avoid canned foods and pre-made or frozen meals. Cooking  Avoid adding salt when cooking. Use salt-free seasonings or herbs instead of table salt or sea salt. Check with your health care provider or pharmacist before using salt substitutes.  Do not fry foods. Cook foods using healthy methods such as baking, boiling, grilling, roasting, and broiling instead.  Cook with heart-healthy oils, such as olive, canola, avocado, soybean, or sunflower oil. Meal planning  Eat a balanced diet that includes: ? 4 or more servings of fruits and 4 or more servings of vegetables each day. Try to fill one-half of your plate with fruits and vegetables. ? 6-8 servings of whole grains each day. ? Less than 6 oz (170 g) of lean meat, poultry, or fish each day. A 3-oz (85-g) serving of meat is about the same size as a deck of cards. One egg equals 1 oz (28 g). ? 2-3 servings of low-fat dairy each day. One serving is 1 cup (237 mL). ? 1 serving of nuts, seeds, or beans 5 times each week. ? 2-3 servings of heart-healthy fats. Healthy fats called omega-3 fatty acids are found in foods such as walnuts, flaxseeds, fortified milks, and eggs. These fats are also found in cold-water fish, such as sardines, salmon, and  mackerel.  Limit how much you eat of: ? Canned or prepackaged foods. ? Food that is high in trans fat, such as some fried foods. ? Food that is high in saturated fat, such as fatty meat. ? Desserts and other sweets, sugary drinks, and other foods with added sugar. ? Full-fat dairy products.  Do not salt foods before eating.  Do not eat more than 4 egg yolks a week.  Try to eat at least 2 vegetarian meals a week.  Eat more home-cooked food and less restaurant, buffet, and fast food.  Lifestyle  When eating at a restaurant, ask that your food be prepared with less salt or no salt, if possible.  If you drink alcohol: ? Limit how much you use to:  0-1 drink a day for women who are not pregnant.  0-2 drinks a day for men. ? Be aware of how much alcohol is in your drink. In the [place] one drink equals one 12 oz bottle of beer (355 mL), one 5 oz glass of wine (148 mL), or one 1 oz glass of hard liquor (44 mL). General information  Avoid eating more than 2,300 mg of salt a day. If you have hypertension, you may need to reduce your sodium intake to 1,500 mg a day.  Work with your health care provider to maintain a healthy body weight or to lose weight. Ask what an ideal weight is for you.  Get at least 30 minutes of exercise that causes your heart to beat faster (aerobic exercise) most days of the week. Activities may include walking, swimming, or biking.  Work with your health care provider or dietitian to adjust your eating plan to your individual calorie needs. What foods should I eat? Fruits All fresh, dried, or frozen fruit. Canned fruit in natural juice (without added sugar). Vegetables Fresh or frozen vegetables (raw, steamed, roasted, or grilled). Low-sodium or reduced-sodium tomato and vegetable juice. Low-sodium or reduced-sodium tomato sauce and tomato paste. Low-sodium or reduced-sodium canned vegetables. Grains Whole-grain or whole-wheat bread. Whole-grain or  whole-wheat pasta. Brown rice. [place]. Bulgur. Whole-grain and low-sodium cereals. Pita bread. Low-fat, low-sodium crackers. Whole-wheat flour tortillas. Meats and other proteins Skinless chicken or [place]. Ground chicken or [place]. Pork with fat trimmed off. Fish and seafood. Egg whites. Dried beans, peas, or lentils. Unsalted nuts, nut butters, and seeds. Unsalted canned beans. Lean cuts of beef with fat trimmed off. Low-sodium, lean precooked or cured meat, such as sausages or meat loaves. Dairy Low-fat (1%) or fat-free (skim) milk. Reduced-fat, low-fat, or fat-free cheeses. Nonfat, low-sodium ricotta or cottage cheese. Low-fat or nonfat yogurt. Low-fat, low-sodium cheese. Fats and oils Soft margarine without trans fats. Vegetable oil. Reduced-fat, low-fat, or light mayonnaise and salad dressings (reduced-sodium). Canola, safflower, olive, avocado, soybean, and sunflower oils. Avocado. Seasonings and condiments Herbs. Spices. Seasoning mixes without salt. Other foods Unsalted popcorn and pretzels. Fat-free sweets. The items listed above may not be a complete list of foods and beverages you can eat. Contact a dietitian for more information. What foods should I avoid? Fruits Canned fruit in a light or heavy syrup. Fried fruit. Fruit in cream or butter sauce. Vegetables Creamed or fried vegetables. Vegetables in a cheese sauce. Regular canned vegetables (not low-sodium or reduced-sodium). Regular canned tomato sauce and paste (not low-sodium or reduced-sodium). Regular tomato and vegetable juice (not low-sodium or reduced-sodium). Angie Fava. Olives. Grains Baked goods made with fat, such as croissants, muffins, or some breads. Dry pasta or rice meal packs. Meats and other proteins Fatty cuts of meat. Ribs. Fried meat. Berniece Salines. Bologna, salami, and other precooked or cured meats, such as sausages or meat loaves. Fat from the back of a pig (fatback). Bratwurst. Salted nuts and seeds. Canned  beans with added salt. Canned or smoked fish. Whole eggs or egg yolks. Chicken or [place] with skin. Dairy Whole or 2% milk, cream, and half-and-half. Whole or full-fat cream cheese. Whole-fat or sweetened yogurt. Full-fat cheese. Nondairy creamers. Whipped toppings. Processed cheese and cheese spreads. Fats and oils Butter. Stick margarine. Lard. Shortening. Ghee. Bacon fat.  Tropical oils, such as coconut, palm kernel, or palm oil. Seasonings and condiments Onion salt, garlic salt, seasoned salt, table salt, and sea salt. Worcestershire sauce. Tartar sauce. Barbecue sauce. Teriyaki sauce. Soy sauce, including reduced-sodium. Steak sauce. Canned and packaged gravies. Fish sauce. Oyster sauce. Cocktail sauce. Store-bought horseradish. Ketchup. Mustard. Meat flavorings and tenderizers. Bouillon cubes. Hot sauces. Pre-made or packaged marinades. Pre-made or packaged taco seasonings. Relishes. Regular salad dressings. Other foods Salted popcorn and pretzels. The items listed above may not be a complete list of foods and beverages you should avoid. Contact a dietitian for more information. Where to find more information  National Heart, Lung, and Blood Institute: https://wilson-eaton.com/  American Heart Association: www.heart.org  Academy of Nutrition and Dietetics: www.eatright.[place]: www.kidney.org Summary  The DASH eating plan is a healthy eating plan that has been shown to reduce high blood pressure (hypertension). It may also reduce your risk for type 2 diabetes, heart disease, and stroke.  When on the DASH eating plan, aim to eat more fresh fruits and vegetables, whole grains, lean proteins, low-fat dairy, and heart-healthy fats.  With the DASH eating plan, you should limit salt (sodium) intake to 2,300 mg a day. If you have hypertension, you may need to reduce your sodium intake to 1,500 mg a day.  Work with your health care provider or dietitian to adjust your eating  plan to your individual calorie needs. This information is not intended to replace advice given to you by your health care provider. Make sure you discuss any questions you have with your health care provider. Document Revised: 05/06/2019 Document Reviewed: 05/06/2019 Elsevier Patient Education  2021 [employer].

## 2020-10-04 ENCOUNTER — Other Ambulatory Visit: Payer: Self-pay | Admitting: Family Medicine

## 2020-10-04 DIAGNOSIS — M25511 Pain in right shoulder: Secondary | ICD-10-CM

## 2020-10-05 ENCOUNTER — Encounter: Payer: Self-pay | Admitting: Family Medicine

## 2020-10-05 DIAGNOSIS — J0140 Acute pansinusitis, unspecified: Secondary | ICD-10-CM

## 2020-10-08 MED ORDER — MOMETASONE FUROATE 50 MCG/ACT NA SUSP: 2.0000 | Freq: Every day | NASAL | 12 refills | Status: DC

## 2020-10-08 MED ORDER — CYCLOBENZAPRINE HCL 10 MG PO TABS: 10.0000 mg | ORAL_TABLET | Freq: Two times a day (BID) | ORAL | 0 refills | Status: DC | PRN

## 2020-10-16 DIAGNOSIS — H90A31 Mixed conductive and sensorineural hearing loss, unilateral, right ear with restricted hearing on the contralateral side: Secondary | ICD-10-CM

## 2020-10-16 DIAGNOSIS — J309 Allergic rhinitis, unspecified: Secondary | ICD-10-CM | POA: Insufficient documentation

## 2020-10-16 DIAGNOSIS — H90A22 Sensorineural hearing loss, unilateral, left ear, with restricted hearing on the contralateral side: Secondary | ICD-10-CM | POA: Diagnosis not present

## 2020-10-16 DIAGNOSIS — Z974 Presence of external hearing-aid: Secondary | ICD-10-CM | POA: Diagnosis not present

## 2020-10-16 HISTORY — DX: Allergic rhinitis, unspecified: J30.9

## 2020-10-16 HISTORY — DX: Mixed conductive and sensorineural hearing loss, unilateral, right ear with restricted hearing on the contralateral side: H90.A31

## 2020-10-24 ENCOUNTER — Other Ambulatory Visit: Payer: Self-pay | Admitting: Family Medicine

## 2020-10-25 ENCOUNTER — Other Ambulatory Visit: Payer: Self-pay | Admitting: Neurosurgery

## 2020-10-25 DIAGNOSIS — D497 Neoplasm of unspecified behavior of endocrine glands and other parts of nervous system: Secondary | ICD-10-CM

## 2020-10-30 DIAGNOSIS — M5134 Other intervertebral disc degeneration, thoracic region: Secondary | ICD-10-CM | POA: Diagnosis not present

## 2020-10-30 DIAGNOSIS — G9589 Other specified diseases of spinal cord: Secondary | ICD-10-CM | POA: Diagnosis not present

## 2020-10-30 DIAGNOSIS — Z79891 Long term (current) use of opiate analgesic: Secondary | ICD-10-CM | POA: Diagnosis not present

## 2020-10-30 DIAGNOSIS — M5030 Other cervical disc degeneration, unspecified cervical region: Secondary | ICD-10-CM | POA: Diagnosis not present

## 2020-10-30 DIAGNOSIS — Z79899 Other long term (current) drug therapy: Secondary | ICD-10-CM | POA: Diagnosis not present

## 2020-10-30 DIAGNOSIS — G894 Chronic pain syndrome: Secondary | ICD-10-CM | POA: Diagnosis not present

## 2020-11-04 ENCOUNTER — Other Ambulatory Visit: Payer: Self-pay | Admitting: Family Medicine

## 2020-11-04 DIAGNOSIS — K649 Unspecified hemorrhoids: Secondary | ICD-10-CM

## 2020-11-09 ENCOUNTER — Other Ambulatory Visit: Payer: Self-pay

## 2020-11-09 ENCOUNTER — Ambulatory Visit
Admission: RE | Admit: 2020-11-09 | Discharge: 2020-11-09 | Disposition: A | Discharge status: Home / Self Care | Payer: BC Managed Care – PPO | Source: Ambulatory Visit | Attending: Neurosurgery | Admitting: Neurosurgery

## 2020-11-09 DIAGNOSIS — G9589 Other specified diseases of spinal cord: Secondary | ICD-10-CM | POA: Diagnosis not present

## 2020-11-09 DIAGNOSIS — D497 Neoplasm of unspecified behavior of endocrine glands and other parts of nervous system: Secondary | ICD-10-CM

## 2020-11-09 MED ORDER — GADOBENATE DIMEGLUMINE 529 MG/ML IV SOLN
15.0000 mL | Freq: Once | INTRAVENOUS | Status: AC | PRN
  Administered 2020-11-09: 15 mL via INTRAVENOUS

## 2020-11-22 ENCOUNTER — Other Ambulatory Visit: Payer: Self-pay

## 2020-11-22 ENCOUNTER — Ambulatory Visit (INDEPENDENT_AMBULATORY_CARE_PROVIDER_SITE_OTHER): Payer: BC Managed Care – PPO | Admitting: Family Medicine

## 2020-11-22 ENCOUNTER — Encounter: Payer: Self-pay | Admitting: Family Medicine

## 2020-11-22 VITALS — BP 110/70 | HR 88 | Temp 98.7°F | Resp 18 | Ht 69.0 in | Wt 170.6 lb

## 2020-11-22 DIAGNOSIS — I10 Essential (primary) hypertension: Secondary | ICD-10-CM | POA: Diagnosis not present

## 2020-11-22 DIAGNOSIS — E785 Hyperlipidemia, unspecified: Secondary | ICD-10-CM | POA: Diagnosis not present

## 2020-11-22 DIAGNOSIS — K649 Unspecified hemorrhoids: Secondary | ICD-10-CM

## 2020-11-22 DIAGNOSIS — E1169 Type 2 diabetes mellitus with other specified complication: Secondary | ICD-10-CM

## 2020-11-22 HISTORY — DX: Type 2 diabetes mellitus with other specified complication: E11.69

## 2020-11-22 MED ORDER — LOSARTAN POTASSIUM 100 MG PO TABS: 100.0000 mg | ORAL_TABLET | Freq: Every day | ORAL | 3 refills | Status: DC

## 2020-11-22 MED ORDER — PROCTOFOAM HC 1-1 % EX FOAM: CUTANEOUS | 2 refills | Status: DC

## 2020-11-22 MED ORDER — FINASTERIDE 5 MG PO TABS: ORAL_TABLET | Freq: Every day | ORAL | 3 refills | Status: AC

## 2020-11-22 NOTE — Patient Instructions (Signed)

## 2020-11-22 NOTE — Assessment & Plan Note (Signed)
Well controlled, no changes to meds. Encouraged heart healthy diet such as the DASH diet and exercise as tolerated.  °

## 2020-11-22 NOTE — Progress Notes (Signed)
Subjective:   By signing my name below, I, Shehryar Baig, attest that this documentation has been prepared under the direction and in the presence of Dr. Roma Schanz, DO. 11/22/2020      Patient ID: Chase [name], male    DOB: 1960/10/06, 60 y.o.   MRN: [id number]  Chief Complaint  Patient presents with   Hyperlipidemia   Follow-up    HPI Patient is in today for a office visit to f/u cholesterol .Marland Kitchen  He is requesting a refill on 5 mg finasteride daily PO, hydrocortisone-pramoxine daily 3x daily and 100 mg losartan daily PO. He reports that his dizziness has improved since his last visit.  He continues seeing Dr. Cruzita Lederer to manage his diabetes.  Lab Results  Component Value Date   HGBA1C 6.7 (A) 09/10/2020   His blood pressure is well managed at this time. BP Readings from Last 3 Encounters:  11/22/20 110/70  09/18/20 (!) 142/86  09/10/20 138/82    Past Medical History:  Diagnosis Date   Abnormality of gait 11/10/2012   Acute GI bleeding    Acute renal insufficiency 02/10/2015   Acute upper respiratory infection 08/10/2012   Allergic drug reaction 05/24/2020   Allergy    Anemia 02/10/2015   Annual physical exam 11/26/2011   ANXIETY DEPRESSION 12/09/2007   Qualifier: Diagnosis of  By: Cori Razor RN, Mikal Plane    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 10/17/2014   Arthralgia 07/25/2010   Arthritis    Benign neoplasm of brain ([place]) 05/24/2019   Bigeminy ? 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 ([place]) 03/12/2018   Cerebrovascular accident, old 11/16/2014   Chest discomfort 06/08/2019   Chest pain    Chronic cough 08/27/2014   CXR 08/2014:  Mild hyperinflation, no acute process Arlyce Harman 09/2014:  Difficulty with procedure,  but essentially normal +CVA's in past, with aspiration risk by history Speech evaluation 2016:      Chronic left shoulder pain 08/06/2020   Chronic pain syndrome 11/23/2019   Chronic pansinusitis 06/30/2018   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   Constipation    Constipation    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   DDD (degenerative disc disease), cervical 04/26/2019   DDD (degenerative disc disease), lumbar 04/26/2019   Decreased hearing of both ears 02/12/2013   Deviated septum 05/31/2018   Diabetes mellitus without complication ([place])    Difficulty urinating    DM (diabetes mellitus) type II uncontrolled, periph vascular disorder ([place]) 01/02/2016   Dry skin 05/09/2019   Dysuria 11/29/2010   Essential (primary) hypertension 01/26/2015   Excessive daytime sleepiness 02/11/2018   FATIGUE 12/09/2007   Qualifier: Diagnosis of  By: Aurther Loft    Fibromyalgia 03/02/2017   Food intolerance 05/09/2019   GERD 12/09/2007   Qualifier: Diagnosis of  By: Niel Hummer MD, Lorinda Creed    GERD (gastroesophageal reflux disease)    GI bleed 02/10/2015   Headache(784.0)    Hearing loss    Hearing loss    Hemangioma 02/12/2013   Hemorrhoid 05/22/2011   Hemorrhoids 02/10/2015   HTN (hypertension) 02/04/2010   Qualifier: Diagnosis of  By: Charlett Blake MD, Erline Levine  Hyperlipidemia    Hyperlipidemia LDL goal <70 01/26/2015   Hypersomnia, persistent 11/05/2012   epworth of 18 points, was placed on CPAP in 2008 with very low AHI ( no number  Mentioned) and struggles ever since with PAP. 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   Leg swelling    Localized swelling of both lower legs 03/02/2017   Low  back pain 07/20/2007   Qualifier: Diagnosis of  By: Niel Hummer MD, Lorinda Creed    Lower extremity edema 07/04/2020   Lower GI bleed    Nasal congestion    Nasal turbinate hypertrophy 05/31/2018   Neck pain 03/15/2020   NEPHROLITHIASIS 08/13/2006   Qualifier: Diagnosis of  By: Beryle Lathe     Nocturia more than twice per night 02/11/2018   OSA on CPAP 10/17/2014   Other chronic pain 03/02/2017   Other fatigue 07/04/2020   Pain in finger of right hand 10/06/2012   Pain in joint of right shoulder 02/21/2017   Palpitations 01/26/2015   Paresthesia 07/25/2010   Persistent atrial fibrillation ([place]) 08/29/2020   Persistent headaches 10/17/2014   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   Qualifier: Diagnosis of  By: Niel Hummer MD, Izell [place] R    PONV (postoperative nausea and vomiting)    Preoperative clearance 07/04/2020   Prolapsed hemorrhoids 10/05/2013   Radiculopathy, lumbar region 03/15/2020   Rash 05/24/2019   Raynaud's disease 03/02/2017   Raynaud's phenomenon 03/02/2017   Rectal bleeding 02/12/2015   Rectal pain    Rectal ulcer with bleeding after hemorrhoid banding 02/11/2015   Rhinitis, chronic 12/23/2006   Qualifier: Diagnosis of  By: Cori Razor RN, Regina G    Right shoulder pain 02/15/2015   Seasonal and perennial allergic rhinitis 05/09/2019   Severe episode of recurrent major depressive disorder, without psychotic features ([place]) 08/29/2020   Sinus infection 06/30/2014   Sinusitis, acute, maxillary 02/02/2014   Sleep apnea    Sleep apnea with use of continuous positive airway pressure (CPAP) 02/11/2018   SOB (shortness of breath) 07/04/2020   Stroke ([place])    Stroke ([place])    Subacute confusional state 10/17/2014   SYMPTOM, HYPERSOMNIA NOS 02/19/2007   Qualifier: Diagnosis of  By: Tilden Dome     Thrombosed hemorrhoids    Tick bite of back 11/23/2019   Tinnitus of both ears 08/29/2020   Trouble swallowing    Unspecified sleep apnea 11/05/2012   Varicose veins of lower extremities with other complications 5/88/5027    Vertigo 03/19/2012    Past Surgical History:  Procedure Laterality Date   CATARACT EXTRACTION     x 3   EYE SURGERY  1968, 1985, 1987   cataracts   FLEXIBLE SIGMOIDOSCOPY N/A 02/11/2015   Procedure: FLEXIBLE SIGMOIDOSCOPY;  Surgeon: Gatha Mayer, MD;  Location: [facility];  Service: Endoscopy;  Laterality: N/A;   INNER EAR SURGERY     rt ear   INNER EAR SURGERY  1992   surgery - right arm     1983    Family History  Problem Relation Age of Onset   Breast cancer Mother 82       breast   Huntington's disease Mother    Stroke Father    Heart  disease Father    Hypertension Father    Heart attack Father    Hypertension Brother    Hyperlipidemia Neg Hx    Diabetes Neg Hx     Social History   Socioeconomic History   Marital status: Single    Spouse name: Not on file   Number of children: 0   Years of education: Not on file   Highest education level: Not on file  Occupational History   Not on file  Tobacco Use   Smoking status: Never   Smokeless tobacco: Never  Vaping Use   Vaping Use: Never used  Substance and Sexual Activity   Alcohol use: No    Alcohol/week: 0.0 standard drinks    Comment: none   Drug use: No   Sexual activity: Never  Other Topics Concern   Not on file  Social History Narrative   No caffeine intake except for chocolate.  Exercised-walking   Social Determinants of Radio broadcast assistant Strain: Not on file  Food Insecurity: Not on file  Transportation Needs: Not on file  Physical Activity: Not on file  Stress: Not on file  Social Connections: Not on file  Intimate Partner Violence: Not on file    Outpatient Medications Prior to Visit  Medication Sig Dispense Refill   allopurinol (ZYLOPRIM) 100 MG tablet Take 100 mg by mouth 2 (two) times daily.     amLODipine (NORVASC) 2.5 MG tablet Take 1 tablet (2.5 mg total) by mouth daily. 180 tablet 3   azelastine (ASTELIN) 0.1 % nasal spray Place 2 sprays into both nostrils 2 (two) times  daily.     clotrimazole-betamethasone (LOTRISONE) cream Apply 1 application topically 2 (two) times daily.     cyclobenzaprine (FLEXERIL) 10 MG tablet TAKE 1 TABLET BY MOUTH TWICE A DAY AS NEEDED FOR MUSCLE SPASMS 30 tablet 0   cyproheptadine (PERIACTIN) 4 MG tablet Take 4 mg by mouth at bedtime.     empagliflozin (JARDIANCE) 25 MG TABS tablet Take 25 mg by mouth daily.     EPINEPHrine 0.3 mg/0.3 mL IJ SOAJ injection Inject 0.3 mg into the muscle as needed for anaphylaxis.     famotidine (PEPCID) 40 MG/5ML suspension Take 2.5 mLs by mouth daily.     FIBER SELECT GUMMIES PO Take 1 tablet by mouth daily.     finasteride (PROSCAR) 5 MG tablet Take 1 tablet (5 mg total) by mouth daily. 90 tablet 3   fluticasone (CUTIVATE) 0.05 % cream Apply 1 application topically 2 (two) times daily.     gabapentin (NEURONTIN) 100 MG capsule Take 100 mg by mouth 3 (three) times daily.     HYDROcodone-acetaminophen (HYCET) 7.5-325 mg/15 ml solution Take 15 mLs by mouth 4 (four) times daily as needed for pain.     liraglutide (VICTOZA) 18 MG/3ML SOPN Inject under skin 1.8 mg once a day before breakfast 9 mL 3   metFORMIN (GLUCOPHAGE) 500 MG tablet Take by mouth 2 (two) times daily with a meal.     mometasone (NASONEX) 50 MCG/ACT nasal spray Place 2 sprays into the nose daily. 17 g 12   Multiple Vitamins-Minerals (AIRBORNE GUMMIES PO) Take 1 each by mouth daily.     polyethylene glycol powder (GLYCOLAX/MIRALAX) 17 GM/SCOOP powder Take 17 g by mouth daily as needed for constipation.     potassium chloride SA (KLOR-CON) 20 MEQ tablet TAKE 2 TABLETS BY MOUTH DAILY 60 tablet 3   pravastatin (PRAVACHOL) 20 MG tablet Take 20 mg by  mouth daily.     terazosin (HYTRIN) 5 MG capsule Take 5 mg by mouth daily.     venlafaxine XR (EFFEXOR-XR) 75 MG 24 hr capsule Take 75 mg by mouth daily with breakfast.     finasteride (PROSCAR) 5 MG tablet TAKE 1 TABLET BY MOUTH ONCE DAILY 90 tablet 3   losartan (COZAAR) 100 MG tablet Take 1  tablet (100 mg total) by mouth daily. 90 tablet 3   metoprolol tartrate (LOPRESSOR) 100 MG tablet Take 100 mg by mouth daily.     metoprolol tartrate (LOPRESSOR) 25 MG tablet Take 25 mg by mouth 2 (two) times daily.     pravastatin (PRAVACHOL) 40 MG tablet Take 20 mg by mouth 2 (two) times daily.     PROCTOFOAM HC rectal foam APPLY FOAM THREE TIMES A DAY AS NEEDED FOR 7 DAYS 10 g 2   hydrOXYzine (ATARAX/VISTARIL) 25 MG tablet TAKE 1 TABLET BY MOUTH EVERY 8 HOURS AS NEEDED FOR ANXIETY OR ITCHING (Patient not taking: Reported on 11/22/2020) 270 tablet 1   metoprolol tartrate (LOPRESSOR) 50 MG tablet Take 50 mg by mouth daily.     losartan (COZAAR) 100 MG tablet TAKE 1 TABLET (100 MG TOTAL) BY MOUTH DAILY. (Patient not taking: Reported on 11/22/2020) 90 tablet 3   losartan (COZAAR) 50 MG tablet TAKE 1 TABLET (50 MG TOTAL) BY MOUTH DAILY. (Patient not taking: Reported on 11/22/2020) 30 tablet 6   potassium chloride 20 MEQ/15ML (10%) SOLN Take 30 mLs by mouth daily.     No facility-administered medications prior to visit.    Allergies  Allergen Reactions   Amoxicillin Hives, Itching and Other (See Comments)    White tongue; ? hives   Sulfa Antibiotics Hives and Rash   Terfenadine     ? reaction    ROS     Objective:    Physical Exam Constitutional:      General: He is not in acute distress.    Appearance: Normal appearance. He is not ill-appearing.  HENT:     Head: Normocephalic and atraumatic.     Right Ear: External ear normal.     Left Ear: External ear normal.  Eyes:     Extraocular Movements: Extraocular movements intact.     Pupils: Pupils are equal, round, and reactive to light.  Cardiovascular:     Rate and Rhythm: Normal rate and regular rhythm.     Pulses: Normal pulses.     Heart sounds: Normal heart sounds. No murmur heard.   No gallop.  Pulmonary:     Effort: Pulmonary effort is normal. No respiratory distress.     Breath sounds: Normal breath sounds. No wheezing,  rhonchi or rales.  Skin:    General: Skin is warm and dry.  Neurological:     Mental Status: He is alert and oriented to person, place, and time.  Psychiatric:        Behavior: Behavior normal.    BP 110/70 (BP Location: Right Arm, Patient Position: Sitting, Cuff Size: Normal)   Pulse 88   Temp 98.7 F (37.1 C) (Oral)   Resp 18   Ht 5\' 9"  (1.753 m)   Wt 170 lb 9.6 oz (77.4 kg)   SpO2 97%   BMI 25.19 kg/m  Wt Readings from Last 3 Encounters:  11/22/20 170 lb 9.6 oz (77.4 kg)  09/18/20 176 lb (79.8 kg)  09/10/20 177 lb 9.6 oz (80.6 kg)    Diabetic Foot Exam - Simple   No  data filed    Lab Results  Component Value Date   WBC 7.2 07/03/2020   HGB 13.8 07/03/2020   HCT 40.8 07/03/2020   PLT 287.0 07/03/2020   GLUCOSE 143 (H) 08/28/2020   CHOL 122 08/21/2020   TRIG 249.0 (H) 08/21/2020   HDL 28.50 (L) 08/21/2020   LDLDIRECT 56.0 08/21/2020   LDLCALC 76 05/30/2020   ALT 28 08/21/2020   AST 24 08/21/2020   NA 140 08/28/2020   K 3.5 08/28/2020   CL 100 08/28/2020   CREATININE 0.93 08/28/2020   BUN 9 08/28/2020   CO2 32 08/28/2020   TSH 4.40 07/03/2020   PSA 1.01 10/03/2014   INR 1.11 02/10/2015   HGBA1C 6.7 (A) 09/10/2020   MICROALBUR <0.7 05/24/2020    Lab Results  Component Value Date   TSH 4.40 07/03/2020   Lab Results  Component Value Date   WBC 7.2 07/03/2020   HGB 13.8 07/03/2020   HCT 40.8 07/03/2020   MCV 87.0 07/03/2020   PLT 287.0 07/03/2020   Lab Results  Component Value Date   NA 140 08/28/2020   K 3.5 08/28/2020   CO2 32 08/28/2020   GLUCOSE 143 (H) 08/28/2020   BUN 9 08/28/2020   CREATININE 0.93 08/28/2020   BILITOT 0.9 08/21/2020   ALKPHOS 79 08/21/2020   AST 24 08/21/2020   ALT 28 08/21/2020   PROT 6.5 08/21/2020   ALBUMIN 4.3 08/21/2020   CALCIUM 9.4 08/28/2020   ANIONGAP 9 02/12/2015   GFR 89.56 08/28/2020   Lab Results  Component Value Date   CHOL 122 08/21/2020   Lab Results  Component Value Date   HDL 28.50 (L)  08/21/2020   Lab Results  Component Value Date   LDLCALC 76 05/30/2020   Lab Results  Component Value Date   TRIG 249.0 (H) 08/21/2020   Lab Results  Component Value Date   CHOLHDL 4 08/21/2020   Lab Results  Component Value Date   HGBA1C 6.7 (A) 09/10/2020       Assessment & Plan:   Problem List Items Addressed This Visit       Unprioritized   HTN (hypertension)    Well controlled, no changes to meds. Encouraged heart healthy diet such as the DASH diet and exercise as tolerated.        Relevant Medications   metoprolol tartrate (LOPRESSOR) 50 MG tablet   pravastatin (PRAVACHOL) 20 MG tablet   losartan (COZAAR) 100 MG tablet   Hyperlipidemia associated with type 2 diabetes mellitus ([place]) - Primary    Tolerating statin, encouraged heart healthy diet, avoid trans fats, minimize simple carbs and saturated fats. Increase exercise as tolerated       Relevant Medications   pravastatin (PRAVACHOL) 20 MG tablet   losartan (COZAAR) 100 MG tablet   Other Relevant Orders   Lipid panel   Comprehensive metabolic panel   Unspecified hemorrhoids    Refill meds        Relevant Medications   metoprolol tartrate (LOPRESSOR) 50 MG tablet   hydrocortisone-pramoxine (PROCTOFOAM HC) rectal foam   pravastatin (PRAVACHOL) 20 MG tablet   losartan (COZAAR) 100 MG tablet     Meds ordered this encounter  Medications   finasteride (PROSCAR) 5 MG tablet    Sig: TAKE 1 TABLET BY MOUTH ONCE DAILY    Dispense:  90 tablet    Refill:  3   hydrocortisone-pramoxine (PROCTOFOAM HC) rectal foam    Sig: APPLY FOAM THREE TIMES A DAY AS  NEEDED FOR 7 DAYS    Dispense:  10 g    Refill:  2   losartan (COZAAR) 100 MG tablet    Sig: Take 1 tablet (100 mg total) by mouth daily.    Dispense:  90 tablet    Refill:  3    I, Dr. Roma Schanz, DO, personally preformed the services described in this documentation.  All medical record entries made by the scribe were at my direction and in  my presence.  I have reviewed the chart and discharge instructions (if applicable) and agree that the record reflects my personal performance and is accurate and complete. 11/22/2020   I,Shehryar Baig,acting as a scribe for Ann Held, DO.,have documented all relevant documentation on the behalf of Ann Held, DO,as directed by  Ann Held, DO while in the presence of Ann Held, DO.   Ann Held, DO

## 2020-11-22 NOTE — Assessment & Plan Note (Signed)
Tolerating statin, encouraged heart healthy diet, avoid trans fats, minimize simple carbs and saturated fats. Increase exercise as tolerated 

## 2020-11-22 NOTE — Assessment & Plan Note (Signed)
Refill meds

## 2020-11-23 ENCOUNTER — Other Ambulatory Visit: Payer: Self-pay | Admitting: Podiatry

## 2020-11-26 DIAGNOSIS — M5030 Other cervical disc degeneration, unspecified cervical region: Secondary | ICD-10-CM | POA: Diagnosis not present

## 2020-11-26 DIAGNOSIS — M47812 Spondylosis without myelopathy or radiculopathy, cervical region: Secondary | ICD-10-CM | POA: Diagnosis not present

## 2020-11-26 DIAGNOSIS — J300 Vasomotor rhinitis: Secondary | ICD-10-CM | POA: Diagnosis not present

## 2020-11-26 DIAGNOSIS — G894 Chronic pain syndrome: Secondary | ICD-10-CM | POA: Diagnosis not present

## 2020-11-26 DIAGNOSIS — J309 Allergic rhinitis, unspecified: Secondary | ICD-10-CM | POA: Diagnosis not present

## 2020-11-26 DIAGNOSIS — M5134 Other intervertebral disc degeneration, thoracic region: Secondary | ICD-10-CM | POA: Diagnosis not present

## 2020-11-28 DIAGNOSIS — D497 Neoplasm of unspecified behavior of endocrine glands and other parts of nervous system: Secondary | ICD-10-CM | POA: Diagnosis not present

## 2020-11-28 DIAGNOSIS — G8929 Other chronic pain: Secondary | ICD-10-CM | POA: Insufficient documentation

## 2020-11-28 DIAGNOSIS — M549 Dorsalgia, unspecified: Secondary | ICD-10-CM | POA: Diagnosis not present

## 2020-11-28 HISTORY — DX: Other chronic pain: G89.29

## 2020-11-29 ENCOUNTER — Other Ambulatory Visit: Payer: Self-pay

## 2020-11-29 ENCOUNTER — Other Ambulatory Visit (INDEPENDENT_AMBULATORY_CARE_PROVIDER_SITE_OTHER): Payer: BC Managed Care – PPO

## 2020-11-29 ENCOUNTER — Ambulatory Visit: Payer: BC Managed Care – PPO | Admitting: Family Medicine

## 2020-11-29 DIAGNOSIS — E1169 Type 2 diabetes mellitus with other specified complication: Secondary | ICD-10-CM

## 2020-11-29 DIAGNOSIS — E785 Hyperlipidemia, unspecified: Secondary | ICD-10-CM

## 2020-11-29 LAB — COMPREHENSIVE METABOLIC PANEL
ALT: 22 U/L (ref 0–53)
AST: 17 U/L (ref 0–37)
Albumin: 4.3 g/dL (ref 3.5–5.2)
Alkaline Phosphatase: 77 U/L (ref 39–117)
BUN: 12 mg/dL (ref 6–23)
CO2: 32 mEq/L (ref 19–32)
Calcium: 9.4 mg/dL (ref 8.4–10.5)
Chloride: 101 mEq/L (ref 96–112)
Creatinine, Ser: 1.02 mg/dL (ref 0.40–1.50)
GFR: 80.02 mL/min (ref >60.00)
Glucose, Bld: 109 mg/dL — ABNORMAL HIGH (ref 70–99)
Potassium: 3.6 mEq/L (ref 3.5–5.1)
Sodium: 142 mEq/L (ref 135–145)
Total Bilirubin: 0.9 mg/dL (ref 0.2–1.2)
Total Protein: 6.4 g/dL (ref 6.0–8.3)

## 2020-11-29 LAB — LIPID PANEL
Cholesterol: 126 mg/dL (ref 0–200)
HDL: 32.3 mg/dL — ABNORMAL LOW (ref >39.00)
LDL Cholesterol: 74 mg/dL (ref 0–99)
NonHDL: 93.98
Total CHOL/HDL Ratio: 4
Triglycerides: 99 mg/dL (ref 0.0–149.0)
VLDL: 19.8 mg/dL (ref 0.0–40.0)

## 2020-12-01 ENCOUNTER — Other Ambulatory Visit: Payer: Self-pay | Admitting: Family Medicine

## 2020-12-01 DIAGNOSIS — E785 Hyperlipidemia, unspecified: Secondary | ICD-10-CM

## 2020-12-02 ENCOUNTER — Other Ambulatory Visit: Payer: Self-pay | Admitting: Internal Medicine

## 2020-12-05 ENCOUNTER — Other Ambulatory Visit: Payer: Self-pay | Admitting: Family Medicine

## 2020-12-05 DIAGNOSIS — I10 Essential (primary) hypertension: Secondary | ICD-10-CM

## 2020-12-30 ENCOUNTER — Other Ambulatory Visit: Payer: Self-pay | Admitting: Family Medicine

## 2020-12-30 DIAGNOSIS — I10 Essential (primary) hypertension: Secondary | ICD-10-CM

## 2020-12-30 DIAGNOSIS — M1A09X0 Idiopathic chronic gout, multiple sites, without tophus (tophi): Secondary | ICD-10-CM

## 2021-01-03 ENCOUNTER — Ambulatory Visit (INDEPENDENT_AMBULATORY_CARE_PROVIDER_SITE_OTHER): Payer: BC Managed Care – PPO | Admitting: Internal Medicine

## 2021-01-03 ENCOUNTER — Encounter: Payer: Self-pay | Admitting: Internal Medicine

## 2021-01-03 ENCOUNTER — Other Ambulatory Visit: Payer: Self-pay

## 2021-01-03 VITALS — BP 138/82 | HR 81 | Ht 69.0 in | Wt 170.8 lb

## 2021-01-03 DIAGNOSIS — E1151 Type 2 diabetes mellitus with diabetic peripheral angiopathy without gangrene: Secondary | ICD-10-CM

## 2021-01-03 DIAGNOSIS — Z23 Encounter for immunization: Secondary | ICD-10-CM | POA: Diagnosis not present

## 2021-01-03 DIAGNOSIS — E785 Hyperlipidemia, unspecified: Secondary | ICD-10-CM | POA: Diagnosis not present

## 2021-01-03 DIAGNOSIS — E1165 Type 2 diabetes mellitus with hyperglycemia: Secondary | ICD-10-CM

## 2021-01-03 DIAGNOSIS — IMO0002 Reserved for concepts with insufficient information to code with codable children: Secondary | ICD-10-CM

## 2021-01-03 LAB — POCT GLYCOSYLATED HEMOGLOBIN (HGB A1C): Hemoglobin A1C: 6.2 % — AB (ref 4.0–5.6)

## 2021-01-03 MED ORDER — EMPAGLIFLOZIN 25 MG PO TABS: 25.0000 mg | ORAL_TABLET | Freq: Every day | ORAL | 3 refills | Status: DC

## 2021-01-03 NOTE — Progress Notes (Signed)
Patient ID: OTHNIEL MARET, male   DOB: [birth year], 60 y.o.   MRN: [id number]  This visit occurred during the SARS-CoV-2 public health emergency.  Safety protocols were in place, including screening questions prior to the visit, additional usage of staff PPE, and extensive cleaning of exam room while observing appropriate contact time as indicated for disinfecting solutions.   HPI: JORI THRALL is a 60 y.o.-year-old male, presenting for follow-up for DM2, dx in 2014, non-insulin-dependent, uncontrolled, with complications (PVD, cerebro-vascular ds - s/p "mini"strokes, PN). Last visit 4 months ago.  Interim history: No increased urination, chest pain. He has blurry vision. Occasional nausea after Victoza, mild. He was diagnosed with an intradural extramedullary spinal tumor.  It did not change in appearance on a recent MRI.  HbA1c levels reviewed: Lab Results  Component Value Date   HGBA1C 6.7 (A) 09/10/2020   HGBA1C 8.2 (H) 05/30/2020   HGBA1C 8.3 (H) 05/24/2020   HGBA1C 9.7 (H) 04/05/2020   HGBA1C 8.3 (H) 05/24/2019   HGBA1C 7.9 (A) 08/04/2018   HGBA1C 7.5 (A) 12/21/2017   HGBA1C 7.4 09/17/2017   HGBA1C 7.8 06/19/2017   HGBA1C 7.1 (H) 02/19/2017   HGBA1C 7.4 02/21/2016   HGBA1C 9.8 (H) 11/13/2015   HGBA1C 6.8 (H) 02/11/2015   HGBA1C 6.8 (H) 10/03/2014   HGBA1C 6.6 (H) 05/26/2014   HGBA1C 6.4 06/03/2013   HGBA1C 6.7 (H) 09/17/2012   HGBA1C 6.2 03/22/2012   HGBA1C 6.2 11/03/2011   HGBA1C 5.6 03/27/2006   Pt is on a regimen of:  - Metformin 500 mg 1-2x a day  - Jardiance 25 mg in am  - Januvia 100 mg in am >> Victoza 1.8 mg weekly-started 05/2020 In 11/2018, we tried to change from Januvia to [place] but this was not covered. Stopped Glipizide b/c rash. Stopped Invokana 100 mg in am >> only got this for 1 mo He was on Metformin 500 mg po bid - in liquid form, as he could not swallow pills. He gets gi upset. He stopped this in 2014.  He is not checking sugars-fingerstick  values are not accurate due to Raynaud's syndrome, however, freestyle libre CGM was not approved by his insurance.  He checks sugars approximately 1-2x a day: - brunch: 153, 228 >> ? >> 157, 181  >> 111-135, 190 >> 108-134, 144 - 2h after brunch:  150-220 >> n/c >> 206, 243 >> 119 >>116 - before dinner: 216 >> 150-200 >> n/c >> 115 >> 109, 149 - 2h after dinner:  180-210 >> 260 >> n/c - bedtime: 172-214 >> 208 >> ? >> 164, 173 >> 121 >> n/c - nighttime: n/c >> 108-138, 151 Lowest sugar was 157 >> 111 >> 108;  he has hypoglycemia awareness in the 70s. Highest sugar was 260 >> 190 >> 149.  Glucometer: [employer] Next >> AccuChek guide  In the past, he was occasional lemonade and ginger ale >> advised to stop - still drinking these but rarely.  -+ Mild CKD, last BUN/creatinine:  Lab Results  Component Value Date   BUN 12 11/29/2020   BUN 9 08/28/2020   CREATININE 1.02 11/29/2020   CREATININE 0.93 08/28/2020   -+ HL.  last set of lipids: Lab Results  Component Value Date   CHOL 126 11/29/2020   HDL 32.30 (L) 11/29/2020   LDLCALC 74 11/29/2020   LDLDIRECT 56.0 08/21/2020   TRIG 99.0 11/29/2020   CHOLHDL 4 11/29/2020  On pravastatin 40.  - last eye exam was in 2021:  No DR reportedly.  Dr. Jabier Mutton.  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 Has varicose veins >> wears compresion hoses. He also has a h/o kidney stones.  On allopurinol for gout. He has anxiety, PTSD from childhood.   ROS: + See HPI.  Past Medical History:  Diagnosis Date   Abnormality of gait 11/10/2012   Acute GI bleeding    Acute renal insufficiency 02/10/2015   Acute upper respiratory infection 08/10/2012   Allergic drug reaction 05/24/2020   Allergy    Anemia 02/10/2015   Annual physical exam 11/26/2011   ANXIETY DEPRESSION 12/09/2007   Qualifier: Diagnosis of  By: Cori Razor RN, Mikal Plane    Aphasia due  to old cerebral infarction 10/17/2014   Arthralgia 07/25/2010   Arthritis    Benign neoplasm of brain ([place]) 05/24/2019   Bigeminy ? 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 ([place]) 03/12/2018   Cerebrovascular accident, old 11/16/2014   Chest discomfort 06/08/2019   Chest pain    Chronic cough 08/27/2014   CXR 08/2014:  Mild hyperinflation, no acute process Arlyce Harman 09/2014:  Difficulty with procedure, but essentially normal +CVA's in past, with aspiration risk by history Speech evaluation 2016:      Chronic left shoulder pain 08/06/2020   Chronic pain syndrome 11/23/2019   Chronic pansinusitis 06/30/2018   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   Constipation    Constipation    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   DDD (degenerative disc disease), cervical 04/26/2019   DDD (degenerative disc disease), lumbar 04/26/2019   Decreased hearing of both ears 02/12/2013   Deviated septum 05/31/2018   Diabetes mellitus without complication ([place])    Difficulty urinating    DM (diabetes mellitus) type II uncontrolled, periph vascular disorder ([place]) 01/02/2016   Dry skin 05/09/2019   Dysuria 11/29/2010   Essential (primary) hypertension 01/26/2015   Excessive daytime sleepiness 02/11/2018   FATIGUE 12/09/2007   Qualifier: Diagnosis of  By: Aurther Loft    Fibromyalgia 03/02/2017   Food intolerance 05/09/2019   GERD 12/09/2007   Qualifier: Diagnosis of  By: Niel Hummer MD, Lorinda Creed    GERD (gastroesophageal reflux disease)    GI bleed 02/10/2015   Headache(784.0)    Hearing loss    Hearing loss    Hemangioma 02/12/2013   Hemorrhoid 05/22/2011   Hemorrhoids 02/10/2015   HTN (hypertension) 02/04/2010   Qualifier: Diagnosis  of  By: Charlett Blake MD, Stacey     Hyperlipidemia    Hyperlipidemia LDL goal <70 01/26/2015   Hypersomnia, persistent 11/05/2012   epworth of 18 points, was placed on CPAP in 2008 with very low AHI ( no number  Mentioned) and struggles ever since with PAP. Marland Kitchen     Hypertension    Hypokalemia 08/29/2020   Hypotension 07/04/2020   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/23/2019   Insect bite 03/16/2018   Insomnia 08/05/2011   Internal and external hemorrhoids without complication 3/87/5643   Internal hemorrhoids 10/22/2014   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  Leg swelling    Localized swelling of both lower legs 03/02/2017   Low back pain 07/20/2007   Qualifier: Diagnosis of  By: Niel Hummer MD, Lorinda Creed    Lower extremity edema 07/04/2020   Lower GI bleed    Nasal congestion    Nasal turbinate hypertrophy 05/31/2018   Neck pain 03/15/2020   NEPHROLITHIASIS 08/13/2006   Qualifier: Diagnosis of  By: Beryle Lathe     Nocturia more than twice per night 02/11/2018   OSA on CPAP 10/17/2014   Other chronic pain 03/02/2017   Other fatigue 07/04/2020   Pain in finger of right hand 10/06/2012   Pain in joint of right shoulder 02/21/2017   Palpitations 01/26/2015   Paresthesia 07/25/2010   Persistent atrial fibrillation ([place]) 08/29/2020   Persistent headaches 10/17/2014   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   Qualifier: Diagnosis of  By: Niel Hummer MD, Izell Bowles R    PONV (postoperative nausea and vomiting)    Preoperative clearance 07/04/2020   Prolapsed hemorrhoids 10/05/2013   Radiculopathy, lumbar region 03/15/2020   Rash 05/24/2019   Raynaud's disease 03/02/2017   Raynaud's phenomenon 03/02/2017   Rectal bleeding 02/12/2015   Rectal pain    Rectal ulcer with bleeding after hemorrhoid banding 02/11/2015   Rhinitis, chronic 12/23/2006   Qualifier: Diagnosis of  By: Cori Razor RN, Regina G    Right shoulder pain 02/15/2015   Seasonal and perennial allergic rhinitis 05/09/2019   Severe episode of  recurrent major depressive disorder, without psychotic features ([place]) 08/29/2020   Sinus infection 06/30/2014   Sinusitis, acute, maxillary 02/02/2014   Sleep apnea    Sleep apnea with use of continuous positive airway pressure (CPAP) 02/11/2018   SOB (shortness of breath) 07/04/2020   Stroke ([facility])    Stroke ([place])    Subacute confusional state 10/17/2014   SYMPTOM, HYPERSOMNIA NOS 02/19/2007   Qualifier: Diagnosis of  By: Tilden Dome     Thrombosed hemorrhoids    Tick bite of back 11/23/2019   Tinnitus of both ears 08/29/2020   Trouble swallowing    Unspecified sleep apnea 11/05/2012   Varicose veins of lower extremities with other complications 9/38/1017   Vertigo 03/19/2012   Past Surgical History:  Procedure Laterality Date   CATARACT EXTRACTION     x 3   EYE SURGERY  1968, 1985, 1987   cataracts   FLEXIBLE SIGMOIDOSCOPY N/A 02/11/2015   Procedure: FLEXIBLE SIGMOIDOSCOPY;  Surgeon: Gatha Mayer, MD;  Location: [facility];  Service: Endoscopy;  Laterality: N/A;   INNER EAR SURGERY     rt ear   INNER EAR SURGERY  1992   surgery - right arm     1983   Social History   Socioeconomic History   Marital status: Single    Spouse name: Not on file   Number of children: 0   Years of education: Not on file   Highest education level: Not on file  Occupational History   Not on file  Tobacco Use   Smoking status: Never   Smokeless tobacco: Never  Vaping Use   Vaping Use: Never used  Substance and Sexual Activity   Alcohol use: No    Alcohol/week: 0.0 standard drinks    Comment: none   Drug use: No   Sexual activity: Never  Other Topics Concern   Not on file  Social History Narrative   No caffeine intake except for chocolate.  Exercised-walking   Social Determinants of Health   Financial Resource Strain:  Not on file  Food Insecurity: Not on file  Transportation Needs: Not on file  Physical Activity: Not on file  Stress: Not on file  Social Connections: Not on file   Intimate Partner Violence: Not on file   Current Outpatient Medications on File Prior to Visit  Medication Sig Dispense Refill   allopurinol (ZYLOPRIM) 100 MG tablet Take 100 mg by mouth 2 (two) times daily.     amLODipine (NORVASC) 2.5 MG tablet Take 1 tablet (2.5 mg total) by mouth daily. 180 tablet 3   azelastine (ASTELIN) 0.1 % nasal spray Place 2 sprays into both nostrils 2 (two) times daily.     clotrimazole-betamethasone (LOTRISONE) cream Apply 1 application topically 2 (two) times daily.     cyclobenzaprine (FLEXERIL) 10 MG tablet TAKE 1 TABLET BY MOUTH TWICE A DAY AS NEEDED FOR MUSCLE SPASM 30 tablet 0   cyproheptadine (PERIACTIN) 4 MG tablet Take 4 mg by mouth at bedtime.     empagliflozin (JARDIANCE) 25 MG TABS tablet Take 25 mg by mouth daily.     EPINEPHrine 0.3 mg/0.3 mL IJ SOAJ injection Inject 0.3 mg into the muscle as needed for anaphylaxis.     famotidine (PEPCID) 40 MG/5ML suspension Take 2.5 mLs by mouth daily.     FIBER SELECT GUMMIES PO Take 1 tablet by mouth daily.     finasteride (PROSCAR) 5 MG tablet Take 1 tablet (5 mg total) by mouth daily. 90 tablet 3   finasteride (PROSCAR) 5 MG tablet TAKE 1 TABLET BY MOUTH ONCE DAILY 90 tablet 3   fluticasone (CUTIVATE) 0.05 % cream Apply 1 application topically 2 (two) times daily.     gabapentin (NEURONTIN) 100 MG capsule TAKE 1 CAPSULE BY MOUTH THREE TIMES A DAY 90 capsule 3   HYDROcodone-acetaminophen (HYCET) 7.5-325 mg/15 ml solution Take 15 mLs by mouth 4 (four) times daily as needed for pain.     hydrocortisone-pramoxine (PROCTOFOAM HC) rectal foam APPLY FOAM THREE TIMES A DAY AS NEEDED FOR 7 DAYS 10 g 2   hydrOXYzine (ATARAX/VISTARIL) 25 MG tablet TAKE 1 TABLET BY MOUTH EVERY 8 HOURS AS NEEDED FOR ANXIETY OR ITCHING (Patient not taking: Reported on 11/22/2020) 270 tablet 1   liraglutide (VICTOZA) 18 MG/3ML SOPN INJECT 1.8 MG UNDER THE SKIN ONCE A DAY BEFORE BREAKFAST. 9 mL 3   losartan (COZAAR) 100 MG tablet Take 1 tablet  (100 mg total) by mouth daily. 90 tablet 3   metFORMIN (GLUCOPHAGE) 500 MG tablet Take by mouth 2 (two) times daily with a meal.     metoprolol tartrate (LOPRESSOR) 50 MG tablet Take 50 mg by mouth daily.     mometasone (NASONEX) 50 MCG/ACT nasal spray Place 2 sprays into the nose daily. 17 g 12   Multiple Vitamins-Minerals (AIR[place] GUMMIES PO) Take 1 each by mouth daily.     polyethylene glycol powder (GLYCOLAX/MIRALAX) 17 GM/SCOOP powder Take 17 g by mouth daily as needed for constipation.     potassium chloride SA (KLOR-CON) 20 MEQ tablet TAKE 2 TABLETS BY MOUTH DAILY 60 tablet 3   pravastatin (PRAVACHOL) 20 MG tablet TAKE 2 TABLETS (40 MG TOTAL) BY MOUTH DAILY. 180 tablet 1   terazosin (HYTRIN) 5 MG capsule Take 5 mg by mouth daily.     venlafaxine XR (EFFEXOR-XR) 75 MG 24 hr capsule Take 75 mg by mouth daily with breakfast.     No current facility-administered medications on file prior to visit.   Allergies  Allergen Reactions   Amoxicillin  Hives, Itching and Other (See Comments)    White tongue; ? hives   Sulfa Antibiotics Hives and Rash   Terfenadine     ? reaction   Family History  Problem Relation Age of Onset   Breast cancer Mother 74       breast   Huntington's disease Mother    Stroke Father    Heart disease Father    Hypertension Father    Heart attack Father    Hypertension Brother    Hyperlipidemia Neg Hx    Diabetes Neg Hx    PE: BP 138/82 (BP Location: Right Arm, Patient Position: Sitting, Cuff Size: Normal)   Pulse 81   Ht 5\' 9"  (1.753 m)   Wt 170 lb 12.8 oz (77.5 kg)   SpO2 99%   BMI 25.22 kg/m  Wt Readings from Last 3 Encounters:  01/03/21 170 lb 12.8 oz (77.5 kg)  11/22/20 170 lb 9.6 oz (77.4 kg)  09/18/20 176 lb (79.8 kg)   Constitutional: Slightly overweight, in NAD Eyes: PERRLA, EOMI, no exophthalmos ENT: moist mucous membranes, no thyromegaly, no cervical lymphadenopathy Cardiovascular: RRR, No MRG, + mild swelling R LE  Respiratory: CTA  B Gastrointestinal: abdomen soft, NT, ND, BS+ Musculoskeletal: no deformities, strength intact in all 4 Skin: moist, warm, no rashes Neurological: no tremor with outstretched hands, DTR normal in all 4  ASSESSMENT: 1. DM2, non-insulin-dependent, uncontrolled, with complications - PVD - cerebro-vascular ds - s/p "mini"strokes - PN  2. HL  PLAN:  1. Patient with history of uncontrolled type 2 diabetes, on metformin, SGLT2 inhibitor and daily GLP-1 receptor agonist starting before last visit.  Ozempic was not covered but we were able to start Victoza and stopped Januvia.  Sugars improved on this regimen and they were mostly at goal at last visit with only few mildly hyperglycemic values in the morning.  We did not change his regimen at that time.  HbA1c was excellent, at 6.7%, decreased.  He is tolerating Victoza well.  Of note, we tried very hard to obtain a CGM for him as he has Raynaud's phenomenon and he could not check blood sugars frequently, but this was not approved by his insurance. -At today's visit, we reviewed his blood sugar values on his phone.  He checks them with his Accu-Chek guide meter which communicates to his phone.  Sugars are mostly at goal in the morning with only an occasional higher blood sugar in the 130s or 140s.  This may happen when he misses Victoza.  Also, he occasionally forgets the second metformin dose.  He describes occasional mild nausea with Victoza, but not enough to desire a change in therapy.  For now, we will continue the current regimen. -I refilled his Jardiance.  However, he is not sure to which pharmacy I need to send it so I [facility] it to both [facility] that [facility]. - I suggested to:  Patient Instructions  Please continue:  - Metformin 500 mg 2x a day - Jardiance 25 mg in am  - Victoza 1.8 mg weekly  Please come back for a follow-up appointment in 4 months.  - we checked his HbA1c: 6.2% (better) - advised to check sugars at different times of  the day - 1x a day, rotating check times - advised for yearly eye exams >> he is due soon - return to clinic in 4 months    2. HL -Reviewed latest lipid panel from last month: LDL only slightly above goal, triglycerides  excellent, slightly low HDL: Lab Results  Component Value Date   CHOL 126 11/29/2020   HDL 32.30 (L) 11/29/2020   LDLCALC 74 11/29/2020   LDLDIRECT 56.0 08/21/2020   TRIG 99.0 11/29/2020   CHOLHDL 4 11/29/2020  -continues Pravastatin 40 mg daily w/o SEs  Philemon Kingdom, MD PhD [facility] Endocrinology

## 2021-01-03 NOTE — Patient Instructions (Signed)
Please continue:  - Metformin 500 mg 2x a day - Jardiance 25 mg in am  - Victoza 1.8 mg weekly  Please come back for a follow-up appointment in 4 months.

## 2021-01-09 ENCOUNTER — Other Ambulatory Visit: Payer: Self-pay

## 2021-01-09 ENCOUNTER — Other Ambulatory Visit: Payer: Self-pay | Admitting: Family Medicine

## 2021-01-09 DIAGNOSIS — G930 Cerebral cysts: Secondary | ICD-10-CM | POA: Insufficient documentation

## 2021-01-11 ENCOUNTER — Ambulatory Visit (INDEPENDENT_AMBULATORY_CARE_PROVIDER_SITE_OTHER): Payer: BC Managed Care – PPO | Admitting: Cardiology

## 2021-01-11 ENCOUNTER — Encounter: Payer: Self-pay | Admitting: Cardiology

## 2021-01-11 ENCOUNTER — Other Ambulatory Visit: Payer: Self-pay

## 2021-01-11 VITALS — BP 118/76 | HR 84 | Ht 69.0 in | Wt 169.0 lb

## 2021-01-11 DIAGNOSIS — E1151 Type 2 diabetes mellitus with diabetic peripheral angiopathy without gangrene: Secondary | ICD-10-CM | POA: Diagnosis not present

## 2021-01-11 DIAGNOSIS — E1165 Type 2 diabetes mellitus with hyperglycemia: Secondary | ICD-10-CM

## 2021-01-11 DIAGNOSIS — E782 Mixed hyperlipidemia: Secondary | ICD-10-CM

## 2021-01-11 DIAGNOSIS — I10 Essential (primary) hypertension: Secondary | ICD-10-CM | POA: Diagnosis not present

## 2021-01-11 DIAGNOSIS — M5134 Other intervertebral disc degeneration, thoracic region: Secondary | ICD-10-CM | POA: Diagnosis not present

## 2021-01-11 DIAGNOSIS — M47812 Spondylosis without myelopathy or radiculopathy, cervical region: Secondary | ICD-10-CM | POA: Diagnosis not present

## 2021-01-11 DIAGNOSIS — G894 Chronic pain syndrome: Secondary | ICD-10-CM | POA: Diagnosis not present

## 2021-01-11 DIAGNOSIS — IMO0002 Reserved for concepts with insufficient information to code with codable children: Secondary | ICD-10-CM

## 2021-01-11 DIAGNOSIS — M5030 Other cervical disc degeneration, unspecified cervical region: Secondary | ICD-10-CM | POA: Diagnosis not present

## 2021-01-11 NOTE — Patient Instructions (Signed)

## 2021-01-11 NOTE — Progress Notes (Signed)
Cardiology Office Note:    Date:  01/11/2021   ID:  Chase [name], DOB 1961-02-07, MRN [id number]  PCP:  Ann Held, DO  Cardiologist:  Jenean Lindau, MD   Referring MD: Carollee Herter, Alferd Apa, *    ASSESSMENT:    1. Primary hypertension   2. Mixed hyperlipidemia   3. DM (diabetes mellitus) type II uncontrolled, periph vascular disorder (HCC)    PLAN:    In order of problems listed above:  Primary prevention stressed with the patient.  Importance of compliance with diet medication stressed and he vocalized understanding.  He leads a sedentary lifestyle.  Importance of regular exercise stressed and he promises to do better. Essential hypertension: Blood pressure stable and diet was emphasized. Mixed dyslipidemia: Lipids were reviewed and patient is on statin therapy and doing fine. Diabetes mellitus: Managed by primary care.  Diet was emphasized. I told him to walk at least half an hour a day 5 days a week and he promises to do so.Patient will be seen in follow-up appointment in 6 months or earlier if the patient has any concerns    Medication Adjustments/Labs and Tests Ordered: Current medicines are reviewed at length with the patient today.  Concerns regarding medicines are outlined above.  No orders of the defined types were placed in this encounter.  No orders of the defined types were placed in this encounter.    No chief complaint on file.    History of Present Illness:    Chase [name] is a 60 y.o. male.  Patient has past medical history of essential hypertension dyslipidemia and diabetes mellitus.  He denies any problems at this time and takes care of activities of daily living.  No chest pain orthopnea or PND.  At the time of my evaluation, the patient is alert awake oriented and in no distress.  Past Medical History:  Diagnosis Date   Abnormality of gait 11/10/2012   Acute GI bleeding    Acute renal insufficiency 02/10/2015   Acute upper  respiratory infection 08/10/2012   Allergic drug reaction 05/24/2020   Allergic rhinitis 10/16/2020   Allergy    Anemia 02/10/2015   Annual physical exam 11/26/2011   ANXIETY DEPRESSION 12/09/2007   Qualifier: Diagnosis of  By: Cori Razor RN, Mikal Plane    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 10/17/2014   Arthralgia 07/25/2010   Arthritis    Benign neoplasm of brain ([place]) 05/24/2019   Bigeminy ? 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 ([place]) 03/12/2018   Cerebrovascular accident, old 11/16/2014   Chest discomfort 06/08/2019   Chronic back pain 11/28/2020   Chronic cough 08/27/2014   CXR 08/2014:  Mild hyperinflation, no acute process Arlyce Harman 09/2014:  Difficulty with procedure, but essentially normal +CVA's in past, with aspiration risk by history Speech evaluation 2016:      Chronic left shoulder pain 08/06/2020   Chronic pain syndrome 11/23/2019   Chronic pansinusitis 06/30/2018   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   Constipation    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   DDD (degenerative disc disease), cervical 04/26/2019   DDD (degenerative disc disease), lumbar 04/26/2019  Decreased hearing of both ears 02/12/2013   Deviated septum 05/31/2018   Diabetes mellitus without complication ([place])    Difficulty urinating    DM (diabetes mellitus) type II uncontrolled, periph vascular disorder ([place]) 01/02/2016   Dry skin 05/09/2019   Dysuria 11/29/2010   Essential (primary) hypertension 01/26/2015   Excessive daytime sleepiness 02/11/2018   FATIGUE 12/09/2007   Qualifier: Diagnosis of  By: Aurther Loft    Fibromyalgia 03/02/2017   Food intolerance 05/09/2019   GERD 12/09/2007    Qualifier: Diagnosis of  By: Niel Hummer MD, Lorinda Creed    GERD (gastroesophageal reflux disease)    GI bleed 02/10/2015   Headache(784.0)    Hearing loss    Hemangioma 02/12/2013   Hemorrhoid 05/22/2011   HTN (hypertension) 02/04/2010   Qualifier: Diagnosis of  By: Charlett Blake MD, Stacey     Hyperlipidemia    Hyperlipidemia associated with type 2 diabetes mellitus ([place]) 11/22/2020   Hyperlipidemia LDL goal <70 01/26/2015   Hypersomnia, persistent 11/05/2012   epworth of 18 points, was placed on CPAP in 2008 with very low AHI ( no number  Mentioned) and struggles ever since with PAP. 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   Leg swelling    Localized swelling of both lower legs 03/02/2017   Low back pain 07/20/2007   Qualifier: Diagnosis of  By: Niel Hummer MD, Lorinda Creed    Lower extremity edema 07/04/2020   Lower GI bleed    Mixed conductive and sensorineural hearing loss of right ear with restricted hearing of left ear 10/16/2020   Nasal congestion    Nasal turbinate hypertrophy 05/31/2018   Neck pain 03/15/2020   NEPHROLITHIASIS 08/13/2006   Qualifier: Diagnosis of  By: Beryle Lathe     Nocturia more than twice per night 02/11/2018   OSA on CPAP 10/17/2014   Other chronic pain 03/02/2017   Other fatigue 07/04/2020   Pain in finger of right hand 10/06/2012   Pain in joint of right shoulder 02/21/2017   Palpitations 01/26/2015   Paresthesia 07/25/2010   Persistent atrial fibrillation ([place]) 08/29/2020   Persistent headaches 10/17/2014   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   Qualifier: Diagnosis of  By: Niel Hummer MD, Izell [place] R    PONV (postoperative nausea and vomiting)    Preoperative  clearance 07/04/2020   Prolapsed hemorrhoids 10/05/2013   Radiculopathy, lumbar region 03/15/2020   Rash 05/24/2019   Raynaud's disease 03/02/2017   Raynaud's phenomenon 03/02/2017   Rectal bleeding 02/12/2015   Rectal pain    Rectal ulcer with bleeding after hemorrhoid banding 02/11/2015   Rhinitis, chronic 12/23/2006   Qualifier: Diagnosis of  By: Cori Razor RN, Regina G    Right shoulder pain 02/15/2015   Seasonal and perennial allergic rhinitis 05/09/2019   Severe episode of recurrent major depressive disorder, without psychotic features ([place]) 08/29/2020   Sinus infection 06/30/2014   Sinusitis, acute, maxillary 02/02/2014   Sleep apnea    Sleep apnea with use of continuous positive airway pressure (CPAP) 02/11/2018   SOB (shortness of breath) 07/04/2020   Stroke ([place])    Stroke ([place])    Subacute confusional state 10/17/2014   SYMPTOM, HYPERSOMNIA NOS 02/19/2007   Qualifier: Diagnosis  of  By: Tilden Dome     Thrombosed hemorrhoids    Tick bite of back 11/23/2019   Tinnitus of both ears 08/29/2020   Trouble swallowing    Unspecified hemorrhoids 02/10/2015   Unspecified sleep apnea 11/05/2012   Varicose veins of lower extremities with other complications [id number]   Vertigo 03/19/2012    Past Surgical History:  Procedure Laterality Date   CATARACT EXTRACTION     x 3   EYE SURGERY  1968, 1985, 1987   cataracts   FLEXIBLE SIGMOIDOSCOPY N/A 02/11/2015   Procedure: FLEXIBLE SIGMOIDOSCOPY;  Surgeon: Gatha Mayer, MD;  Location: [facility];  Service: Endoscopy;  Laterality: N/A;   INNER EAR SURGERY     rt ear   INNER EAR SURGERY  1992   surgery - right arm     1983    Current Medications: Current Meds  Medication Sig   allopurinol (ZYLOPRIM) 100 MG tablet Take 1 tablet (100 mg total) by mouth 2 (two) times daily.   amLODipine (NORVASC) 2.5 MG tablet Take 2.5 mg by mouth daily.   azelastine (ASTELIN) 0.1 % nasal spray Place 2 sprays into both nostrils 2 (two) times  daily.   clotrimazole-betamethasone (LOTRISONE) cream Apply 1 application topically as needed (rash).   cyclobenzaprine (FLEXERIL) 10 MG tablet TAKE 1 TABLET BY MOUTH TWICE A DAY AS NEEDED FOR MUSCLE SPASM   empagliflozin (JARDIANCE) 25 MG TABS tablet Take 1 tablet (25 mg total) by mouth daily.   EPINEPHrine 0.3 mg/0.3 mL IJ SOAJ injection Inject 0.3 mg into the muscle as needed for anaphylaxis.   famotidine (PEPCID) 40 MG/5ML suspension Take 2.5 mLs by mouth daily.   FIBER SELECT GUMMIES PO Take 1 tablet by mouth daily.   finasteride (PROSCAR) 5 MG tablet TAKE 1 TABLET BY MOUTH ONCE DAILY   fluticasone (CUTIVATE) 0.05 % cream Apply 1 application topically 2 (two) times daily.   gabapentin (NEURONTIN) 100 MG capsule TAKE 1 CAPSULE BY MOUTH THREE TIMES A DAY   HYDROcodone-acetaminophen (HYCET) 7.5-325 mg/15 ml solution Take 15 mLs by mouth 4 (four) times daily as needed for pain.   hydrocortisone-pramoxine (PROCTOFOAM HC) rectal foam APPLY FOAM THREE TIMES A DAY AS NEEDED FOR 7 DAYS   hydrOXYzine (ATARAX/VISTARIL) 25 MG tablet TAKE 1 TABLET BY MOUTH EVERY 8 HOURS AS NEEDED FOR ANXIETY OR ITCHING   liraglutide (VICTOZA) 18 MG/3ML SOPN INJECT 1.8 MG UNDER THE SKIN ONCE A DAY BEFORE BREAKFAST.   losartan (COZAAR) 100 MG tablet Take 1 tablet (100 mg total) by mouth daily.   metFORMIN (GLUCOPHAGE) 500 MG tablet Take 500 mg by mouth 2 (two) times daily with a meal.   metoprolol tartrate (LOPRESSOR) 50 MG tablet Take 50 mg by mouth daily.   mometasone (NASONEX) 50 MCG/ACT nasal spray Place 2 sprays into the nose daily.   Multiple Vitamins-Minerals (AIRBORNE GUMMIES PO) Take 1 each by mouth daily.   polyethylene glycol powder (GLYCOLAX/MIRALAX) 17 GM/SCOOP powder Take 17 g by mouth daily as needed for constipation.   pravastatin (PRAVACHOL) 20 MG tablet TAKE 2 TABLETS (40 MG TOTAL) BY MOUTH DAILY.   terazosin (HYTRIN) 5 MG capsule Take 5 mg by mouth daily.   venlafaxine XR (EFFEXOR-XR) 75 MG 24 hr  capsule Take 75 mg by mouth daily with breakfast.     Allergies:   Amoxicillin, Sulfa antibiotics, and Terfenadine   Social History   Socioeconomic History   Marital status: Single    Spouse name: Not on file  Number of children: 0   Years of education: Not on file   Highest education level: Not on file  Occupational History   Not on file  Tobacco Use   Smoking status: Never   Smokeless tobacco: Never  Vaping Use   Vaping Use: Never used  Substance and Sexual Activity   Alcohol use: No    Alcohol/week: 0.0 standard drinks    Comment: none   Drug use: No   Sexual activity: Never  Other Topics Concern   Not on file  Social History Narrative   No caffeine intake except for chocolate.  Exercised-walking   Social Determinants of Radio broadcast assistant Strain: Not on file  Food Insecurity: Not on file  Transportation Needs: Not on file  Physical Activity: Not on file  Stress: Not on file  Social Connections: Not on file     Family History: The patient's family history includes Breast cancer (age of onset: 59) in his mother; Heart attack in his father; Heart disease in his father; Huntington's disease in his mother; Hypertension in his brother and father; Stroke in his father. There is no history of Hyperlipidemia or Diabetes.  ROS:   Please see the history of present illness.    All other systems reviewed and are negative.  EKGs/Labs/Other Studies Reviewed:    The following studies were reviewed today: I discussed my findings with the patient at length.   Recent Labs: 07/03/2020: Hemoglobin 13.8; Platelets 287.0; TSH 4.40 11/29/2020: ALT 22; BUN 12; Creatinine, Ser 1.02; Potassium 3.6; Sodium 142  Recent Lipid Panel    Component Value Date/Time   CHOL 126 11/29/2020 0958   TRIG 99.0 11/29/2020 0958   HDL 32.30 (L) 11/29/2020 0958   CHOLHDL 4 11/29/2020 0958   VLDL 19.8 11/29/2020 0958   LDLCALC 74 11/29/2020 0958   LDLDIRECT 56.0 08/21/2020 0919     Physical Exam:    VS:  BP 118/76   Pulse 84   Ht '5\' 9"'$  (1.753 m)   Wt 169 lb (76.7 kg)   SpO2 96%   BMI 24.96 kg/m     Wt Readings from Last 3 Encounters:  01/11/21 169 lb (76.7 kg)  01/03/21 170 lb 12.8 oz (77.5 kg)  11/22/20 170 lb 9.6 oz (77.4 kg)     GEN: Patient is in no acute distress HEENT: Normal NECK: No JVD; No carotid bruits LYMPHATICS: No lymphadenopathy CARDIAC: Hear sounds regular, 2/6 systolic murmur at the apex. RESPIRATORY:  Clear to auscultation without rales, wheezing or rhonchi  ABDOMEN: Soft, non-tender, non-distended MUSCULOSKELETAL:  No edema; No deformity  SKIN: Warm and dry NEUROLOGIC:  Alert and oriented x 3 PSYCHIATRIC:  Normal affect   Signed, Jenean Lindau, MD  01/11/2021 3:15 PM    [facility]

## 2021-01-15 ENCOUNTER — Other Ambulatory Visit: Payer: Self-pay | Admitting: Family Medicine

## 2021-01-18 ENCOUNTER — Other Ambulatory Visit (HOSPITAL_COMMUNITY): Payer: Self-pay

## 2021-01-31 ENCOUNTER — Other Ambulatory Visit (HOSPITAL_COMMUNITY): Payer: Self-pay

## 2021-01-31 ENCOUNTER — Telehealth: Payer: Self-pay | Admitting: Pharmacy Technician

## 2021-01-31 DIAGNOSIS — E1151 Type 2 diabetes mellitus with diabetic peripheral angiopathy without gangrene: Secondary | ICD-10-CM

## 2021-01-31 DIAGNOSIS — IMO0002 Reserved for concepts with insufficient information to code with codable children: Secondary | ICD-10-CM

## 2021-01-31 MED ORDER — GLUCOSE BLOOD VI STRP: ORAL_STRIP | 3 refills | Status: DC

## 2021-01-31 NOTE — Telephone Encounter (Signed)
OK to send any type of strips covered.

## 2021-01-31 NOTE — Telephone Encounter (Signed)
Patient Advocate Encounter   Received notification from CoverMyMeds/[employer] that prior authorization for accu-check test strips  is required.  However it appears the pt needs to use Contour for his ins to cover. Notes look like he has that system already.  Please send new rx for contour test strips.    [facility] will continue to follow:   Armanda Magic, CPhT Patient Advocate [facility] Phone: [phone] Fax:  [phone]

## 2021-01-31 NOTE — Telephone Encounter (Signed)
Rx sent 

## 2021-01-31 NOTE — Addendum Note (Signed)
Addended by: Lauralyn Primes on: 01/31/2021 04:49 PM   Modules accepted: Orders

## 2021-02-07 ENCOUNTER — Other Ambulatory Visit: Payer: Self-pay | Admitting: Family Medicine

## 2021-02-07 DIAGNOSIS — F322 Major depressive disorder, single episode, severe without psychotic features: Secondary | ICD-10-CM

## 2021-02-07 DIAGNOSIS — G894 Chronic pain syndrome: Secondary | ICD-10-CM | POA: Diagnosis not present

## 2021-02-07 DIAGNOSIS — M5134 Other intervertebral disc degeneration, thoracic region: Secondary | ICD-10-CM | POA: Diagnosis not present

## 2021-02-07 DIAGNOSIS — M47812 Spondylosis without myelopathy or radiculopathy, cervical region: Secondary | ICD-10-CM | POA: Diagnosis not present

## 2021-02-07 DIAGNOSIS — M5459 Other low back pain: Secondary | ICD-10-CM | POA: Diagnosis not present

## 2021-02-21 NOTE — Telephone Encounter (Signed)
Na

## 2021-03-05 ENCOUNTER — Other Ambulatory Visit: Payer: Self-pay | Admitting: Medical

## 2021-03-11 DIAGNOSIS — M5134 Other intervertebral disc degeneration, thoracic region: Secondary | ICD-10-CM | POA: Diagnosis not present

## 2021-03-11 DIAGNOSIS — M5459 Other low back pain: Secondary | ICD-10-CM | POA: Diagnosis not present

## 2021-03-11 DIAGNOSIS — M5030 Other cervical disc degeneration, unspecified cervical region: Secondary | ICD-10-CM | POA: Diagnosis not present

## 2021-03-11 DIAGNOSIS — G894 Chronic pain syndrome: Secondary | ICD-10-CM | POA: Diagnosis not present

## 2021-03-23 ENCOUNTER — Other Ambulatory Visit: Payer: Self-pay | Admitting: Podiatry

## 2021-04-05 ENCOUNTER — Other Ambulatory Visit: Payer: Self-pay | Admitting: Family Medicine

## 2021-04-06 ENCOUNTER — Other Ambulatory Visit: Payer: Self-pay | Admitting: Internal Medicine

## 2021-04-11 DIAGNOSIS — G894 Chronic pain syndrome: Secondary | ICD-10-CM | POA: Diagnosis not present

## 2021-04-11 DIAGNOSIS — M5134 Other intervertebral disc degeneration, thoracic region: Secondary | ICD-10-CM | POA: Diagnosis not present

## 2021-04-11 DIAGNOSIS — G9589 Other specified diseases of spinal cord: Secondary | ICD-10-CM | POA: Diagnosis not present

## 2021-04-11 DIAGNOSIS — M5030 Other cervical disc degeneration, unspecified cervical region: Secondary | ICD-10-CM | POA: Diagnosis not present

## 2021-04-16 ENCOUNTER — Other Ambulatory Visit: Payer: Self-pay | Admitting: Family Medicine

## 2021-04-17 ENCOUNTER — Telehealth (INDEPENDENT_AMBULATORY_CARE_PROVIDER_SITE_OTHER): Payer: BC Managed Care – PPO | Admitting: Family Medicine

## 2021-04-17 ENCOUNTER — Encounter: Payer: Self-pay | Admitting: Family Medicine

## 2021-04-17 ENCOUNTER — Other Ambulatory Visit: Payer: Self-pay

## 2021-04-17 DIAGNOSIS — J0110 Acute frontal sinusitis, unspecified: Secondary | ICD-10-CM | POA: Diagnosis not present

## 2021-04-17 MED ORDER — DOXYCYCLINE HYCLATE 100 MG PO TABS: 100.0000 mg | ORAL_TABLET | Freq: Two times a day (BID) | ORAL | 0 refills | Status: DC

## 2021-04-17 MED ORDER — DOXYCYCLINE CALCIUM 50 MG/5ML PO SYRP: 100.0000 mg | ORAL_SOLUTION | Freq: Two times a day (BID) | ORAL | 0 refills | Status: DC

## 2021-04-17 NOTE — Addendum Note (Signed)
Addended by: Ames Coupe on: 04/17/2021 01:15 PM   Modules accepted: Orders

## 2021-04-17 NOTE — Progress Notes (Signed)
Chief Complaint  Patient presents with   Sinus Problem    [name] here for URI complaints. Due to COVID-19 pandemic, we are interacting via telephone. I verified patient's ID using 2 identifiers. Patient agreed to proceed with visit via this method. Patient is at home, I am at office. Patient and I are present for visit.   Duration:  14  days  Associated symptoms: sinus congestion, sinus pain, rhinorrhea, ear pain, diarrhea Denies: wheezing, shortness of breath, myalgia, and fever, myalgias, N/V, loss of taste/smell Treatment to date: robitussin, Astelin nasal spray, Nasonex Sick contacts: No Has not tested for covid.   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 ([place]) 03/12/2018   Cerebrovascular accident, old 11/16/2014   Chest discomfort 06/08/2019   Chronic back pain 11/28/2020   Chronic cough 08/27/2014   CXR 08/2014:  Mild hyperinflation, no acute process Arlyce Harman 09/2014:  Difficulty with procedure, but essentially normal +CVA's in past, with aspiration risk by history Speech evaluation 2016:      Chronic left shoulder pain 08/06/2020   Chronic pain syndrome 11/23/2019    Chronic pansinusitis 06/30/2018   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   Constipation    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   DDD (degenerative disc disease), cervical 04/26/2019   DDD (degenerative disc disease), lumbar 04/26/2019   Decreased hearing of both ears 02/12/2013   Deviated septum 05/31/2018   Diabetes mellitus without complication ([place])    Difficulty urinating    DM (diabetes mellitus) type II uncontrolled, periph vascular disorder 01/02/2016   Dry skin 05/09/2019   Dysuria 11/29/2010   Essential (primary) hypertension 01/26/2015   Excessive daytime sleepiness 02/11/2018   FATIGUE 12/09/2007   Qualifier: Diagnosis of  By: Cori Razor RN, Mikal Plane    Fibromyalgia 03/02/2017   Food intolerance 05/09/2019   GERD 12/09/2007   Qualifier: Diagnosis of  By: Niel Hummer MD, Lorinda Creed    GERD (gastroesophageal reflux disease)    GI bleed 02/10/2015   Headache(784.0)    Hearing loss    Hemangioma 02/12/2013   Hemorrhoid 05/22/2011   HTN (hypertension) 02/04/2010   Qualifier: Diagnosis of  By: Charlett Blake MD, Stacey     Hyperlipidemia    Hyperlipidemia associated with type 2 diabetes mellitus ([place]) 11/22/2020   Hyperlipidemia LDL goal <70 01/26/2015   Hypersomnia, persistent 11/05/2012   epworth of 18 points, was placed on CPAP in 2008 with very low AHI ( no number  Mentioned) and struggles ever since with PAP. 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   Leg swelling    Localized swelling of both lower legs 03/02/2017   Low back pain 07/20/2007   Qualifier: Diagnosis of  By: Niel Hummer MD, Lorinda Creed    Lower extremity edema 07/04/2020   Lower GI bleed     Mixed conductive and sensorineural hearing loss of right ear with restricted hearing of left ear 10/16/2020   Nasal congestion    Nasal turbinate hypertrophy 05/31/2018   Neck pain 03/15/2020   NEPHROLITHIASIS 08/13/2006   Qualifier: Diagnosis of  By: Beryle Lathe     Nocturia more than twice per night 02/11/2018   OSA on CPAP 10/17/2014   Other chronic pain 03/02/2017   Other fatigue 07/04/2020   Pain in finger of right hand 10/06/2012   Pain in joint of right shoulder 02/21/2017   Palpitations 01/26/2015   Paresthesia 07/25/2010   Persistent atrial fibrillation ([place]) 08/29/2020   Persistent headaches 10/17/2014   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   Qualifier: Diagnosis of  By: Niel Hummer MD, Izell [place] R    PONV (postoperative nausea and vomiting)    Preoperative clearance 07/04/2020   Prolapsed hemorrhoids 10/05/2013   Radiculopathy, lumbar region 03/15/2020   Rash 05/24/2019   Raynaud's disease 03/02/2017   Raynaud's phenomenon 03/02/2017   Rectal bleeding 02/12/2015   Rectal pain    Rectal ulcer with bleeding after hemorrhoid banding 02/11/2015   Rhinitis, chronic 12/23/2006   Qualifier: Diagnosis of  By: Cori Razor RN, Regina G    Right shoulder pain 02/15/2015   Seasonal and perennial allergic rhinitis 05/09/2019   Severe episode of recurrent major depressive disorder, without psychotic features ([place]) 08/29/2020   Sinus infection 06/30/2014   Sinusitis, acute, maxillary 02/02/2014   Sleep apnea    Sleep apnea with use of continuous positive airway pressure (CPAP) 02/11/2018   SOB (shortness of breath) 07/04/2020   Stroke ([place])    Stroke ([place])    Subacute confusional state 10/17/2014   SYMPTOM, HYPERSOMNIA NOS 02/19/2007   Qualifier: Diagnosis of  By: Tilden Dome     Thrombosed hemorrhoids    Tick bite of back 11/23/2019   Tinnitus of both ears 08/29/2020   Trouble swallowing    Unspecified hemorrhoids 02/10/2015   Unspecified sleep apnea 11/05/2012   Varicose veins of  lower extremities with other complications 62/83/1517   Vertigo 03/19/2012    Objective No conversational dyspnea Age appropriate judgment and insight Some word finding trouble Nml affect and mood  Acute frontal sinusitis, recurrence not specified - Plan: doxycycline (VIBRA-TABS) 100 MG tablet  Given duration will tx w 7 d of doxy.  Continue to push fluids, practice good hand hygiene, cover mouth when coughing. F/u prn. If starting to experience fevers, shaking, or shortness of breath, seek immediate care. Total time: 11 min Pt voiced understanding and agreement to the plan.  [place], DO 04/17/21 1:10 PM

## 2021-05-08 ENCOUNTER — Other Ambulatory Visit: Payer: Self-pay | Admitting: Neurosurgery

## 2021-05-08 DIAGNOSIS — D497 Neoplasm of unspecified behavior of endocrine glands and other parts of nervous system: Secondary | ICD-10-CM

## 2021-05-13 ENCOUNTER — Ambulatory Visit: Payer: BC Managed Care – PPO | Admitting: Internal Medicine

## 2021-05-13 ENCOUNTER — Telehealth: Payer: Self-pay | Admitting: *Deleted

## 2021-05-13 NOTE — Telephone Encounter (Signed)
Chief Complaint Dizziness Reason for Call Symptomatic / Request for Health Information Initial Comment caller states had virtual visit with provider and the provider gave him an antibiotic and once he completed it he has gotten worse, symptoms severe headaches, sensitivity with movement, pressure, runny nose, sore throat, congestion, dizziness triggering his vertigo,inbalance. Dr Etter Sjogren location verified Translation No Nurse Assessment Nurse: Kathi Ludwig, RN, Leana Roe Date/Time Eilene Ghazi Time): 05/10/2021 9:19:09 AM Confirm and document reason for call. If symptomatic, describe symptoms. ---Caller states had virtual visit with provider 11/2, finished antibiotic 4 days ago. worsening sx, severe headaches, sensitivity with movement, pressure, runny nose, sore throat, congestion, dizziness triggering his vertigo. temp 98.7  Time) Disposition Final User 05/10/2021 9:24:22 AM Go to ED Now (or PCP triage) Yes Kathi Ludwig, RN, Leana Roe

## 2021-05-13 NOTE — Telephone Encounter (Signed)
Patient has an appointment for tomorrow, but does say anything about dizziness.  May need sooner appointment.

## 2021-05-13 NOTE — Progress Notes (Deleted)
Patient ID: Chase [name], male   DOB: [birth year], 60 y.o.   MRN: [id number]  This visit occurred during the SARS-CoV-2 public health emergency.  Safety protocols were in place, including screening questions prior to the visit, additional usage of staff PPE, and extensive cleaning of exam room while observing appropriate contact time as indicated for disinfecting solutions.   HPI: Chase [name] is a 60 y.o.-year-old male, presenting for follow-up for DM2, dx in 2014, non-insulin-dependent, uncontrolled, with complications (PVD, cerebro-vascular ds - s/p "mini"strokes, PN). Last visit 4 months ago.  Interim history: He has blurry vision.  No increased urination, chest pain.  Only occasional nausea with Victoza.  This is mild.   HbA1c levels reviewed: Lab Results  Component Value Date   HGBA1C 6.2 (A) 01/03/2021   HGBA1C 6.7 (A) 09/10/2020   HGBA1C 8.2 (H) 05/30/2020   HGBA1C 8.3 (H) 05/24/2020   HGBA1C 9.7 (H) 04/05/2020   HGBA1C 8.3 (H) 05/24/2019   HGBA1C 7.9 (A) 08/04/2018   HGBA1C 7.5 (A) 12/21/2017   HGBA1C 7.4 09/17/2017   HGBA1C 7.8 06/19/2017   HGBA1C 7.1 (H) 02/19/2017   HGBA1C 7.4 02/21/2016   HGBA1C 9.8 (H) 11/13/2015   HGBA1C 6.8 (H) 02/11/2015   HGBA1C 6.8 (H) 10/03/2014   HGBA1C 6.6 (H) 05/26/2014   HGBA1C 6.4 06/03/2013   HGBA1C 6.7 (H) 09/17/2012   HGBA1C 6.2 03/22/2012   HGBA1C 6.2 11/03/2011   Pt is on a regimen of:  - Metformin 500 mg 1-2x a day  - Jardiance 25 mg in am  - Januvia 100 mg in am >> Victoza 1.8 mg weekly-started 05/2020 In 11/2018, we tried to change from Januvia to [place] but this was not covered. Stopped Glipizide b/c rash. Stopped Invokana 100 mg in am >> only got this for 1 mo He was on Metformin 500 mg po bid - in liquid form, as he could not swallow pills. He gets gi upset. He stopped this in 2014.  He is not checking sugars-fingerstick values are not accurate due to Raynaud's syndrome, however, freestyle libre CGM was not approved by  his insurance.  He checks sugars approximately 1-2x a day: - brunch: 157, 181  >> 111-135, 190 >> 108-134, 144 - 2h after brunch: n/c >> 206, 243 >> 119 >>116 - before dinner: 150-200 >> n/c >> 115 >> 109, 149 - 2h after dinner:  180-210 >> 260 >> n/c - bedtime: 208 >> ? >> 164, 173 >> 121 >> n/c - nighttime: n/c >> 108-138, 151 Lowest sugar was 157 >> 111 >> 108;  he has hypoglycemia awareness in the 70s. Highest sugar was 260 >> 190 >> 149.  Glucometer: [employer] Next >> AccuChek guide  In the past, he was occasional lemonade and ginger ale >> advised to stop - still drinking these but rarely.  -+ Mild CKD, last BUN/creatinine:  Lab Results  Component Value Date   BUN 12 11/29/2020   BUN 9 08/28/2020   CREATININE 1.02 11/29/2020   CREATININE 0.93 08/28/2020   -+ HL.  last set of lipids: Lab Results  Component Value Date   CHOL 126 11/29/2020   HDL 32.30 (L) 11/29/2020   LDLCALC 74 11/29/2020   LDLDIRECT 56.0 08/21/2020   TRIG 99.0 11/29/2020   CHOLHDL 4 11/29/2020  On pravastatin 40.  - last eye exam was in 2021: No DR reportedly.  Dr. Jabier Mutton.  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 Has varicose veins >> wears compresion hoses. He also has a h/o kidney stones.  On allopurinol for gout. He has anxiety, PTSD from childhood.  Early in 2022, he was diagnosed with an intradural extramedullary spinal tumor.  It did not change in appearance on a recent MRI.  ROS: + See HPI.  Past Medical History:  Diagnosis Date   Abnormality of gait 11/10/2012   Acute GI bleeding    Acute renal insufficiency 02/10/2015   Acute upper respiratory infection 08/10/2012   Allergic drug reaction 05/24/2020   Allergic rhinitis 10/16/2020   Allergy    Anemia 02/10/2015   Annual physical exam 11/26/2011   ANXIETY DEPRESSION 12/09/2007   Qualifier: Diagnosis of  By: Cori Razor RN,  Mikal Plane    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 10/17/2014   Arthralgia 07/25/2010   Arthritis    Benign neoplasm of brain ([place]) 05/24/2019   Bigeminy ? 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 ([place]) 03/12/2018   Cerebrovascular accident, old 11/16/2014   Chest discomfort 06/08/2019   Chronic back pain 11/28/2020   Chronic cough 08/27/2014   CXR 08/2014:  Mild hyperinflation, no acute process Arlyce Harman 09/2014:  Difficulty with procedure, but essentially normal +CVA's in past, with aspiration risk by history Speech evaluation 2016:      Chronic left shoulder pain 08/06/2020   Chronic pain syndrome 11/23/2019   Chronic pansinusitis 06/30/2018   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   Constipation    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   DDD (degenerative disc disease), cervical 04/26/2019   DDD (degenerative disc disease), lumbar 04/26/2019   Decreased hearing of both ears 02/12/2013   Deviated septum 05/31/2018   Diabetes mellitus without complication ([place])    Difficulty urinating    DM (diabetes mellitus) type II uncontrolled, periph vascular disorder 01/02/2016   Dry skin 05/09/2019   Dysuria 11/29/2010   Essential (primary) hypertension 01/26/2015   Excessive daytime sleepiness 02/11/2018   FATIGUE 12/09/2007   Qualifier: Diagnosis of  By: Cori Razor RN, Mikal Plane    Fibromyalgia 03/02/2017   Food intolerance 05/09/2019   GERD 12/09/2007   Qualifier: Diagnosis of  By: Niel Hummer MD, Lorinda Creed    GERD (gastroesophageal reflux disease)    GI bleed 02/10/2015   Headache(784.0)    Hearing loss    Hemangioma 02/12/2013   Hemorrhoid 05/22/2011   HTN (hypertension) 02/04/2010    Qualifier: Diagnosis of  By: Charlett Blake MD, Stacey     Hyperlipidemia    Hyperlipidemia associated with type 2 diabetes mellitus ([place]) 11/22/2020   Hyperlipidemia LDL goal <70 01/26/2015   Hypersomnia, persistent 11/05/2012   epworth of 18 points, was placed on CPAP in 2008 with very low AHI ( no number  Mentioned) and struggles ever since with PAP. Marland Kitchen     Hypertension    Hypokalemia 08/29/2020   Hypotension 07/04/2020   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/23/2019   Insect bite 03/16/2018   Insomnia 08/05/2011   Internal and external hemorrhoids without complication 13/01/6577   Internal hemorrhoids 10/22/2014   Intradural extramedullary spinal  tumor 03/15/2020   Leg swelling    Localized swelling of both lower legs 03/02/2017   Low back pain 07/20/2007   Qualifier: Diagnosis of  By: Niel Hummer MD, Lorinda Creed    Lower extremity edema 07/04/2020   Lower GI bleed    Mixed conductive and sensorineural hearing loss of right ear with restricted hearing of left ear 10/16/2020   Nasal congestion    Nasal turbinate hypertrophy 05/31/2018   Neck pain 03/15/2020   NEPHROLITHIASIS 08/13/2006   Qualifier: Diagnosis of  By: Beryle Lathe     Nocturia more than twice per night 02/11/2018   OSA on CPAP 10/17/2014   Other chronic pain 03/02/2017   Other fatigue 07/04/2020   Pain in finger of right hand 10/06/2012   Pain in joint of right shoulder 02/21/2017   Palpitations 01/26/2015   Paresthesia 07/25/2010   Persistent atrial fibrillation ([place]) 08/29/2020   Persistent headaches 10/17/2014   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   Qualifier: Diagnosis of  By: Niel Hummer MD, Izell [place] R    PONV (postoperative nausea and vomiting)    Preoperative clearance 07/04/2020   Prolapsed hemorrhoids 10/05/2013   Radiculopathy, lumbar region 03/15/2020   Rash 05/24/2019   Raynaud's disease 03/02/2017   Raynaud's phenomenon 03/02/2017   Rectal bleeding 02/12/2015   Rectal pain    Rectal ulcer  with bleeding after hemorrhoid banding 02/11/2015   Rhinitis, chronic 12/23/2006   Qualifier: Diagnosis of  By: Cori Razor RN, Regina G    Right shoulder pain 02/15/2015   Seasonal and perennial allergic rhinitis 05/09/2019   Severe episode of recurrent major depressive disorder, without psychotic features ([place]) 08/29/2020   Sinus infection 06/30/2014   Sinusitis, acute, maxillary 02/02/2014   Sleep apnea    Sleep apnea with use of continuous positive airway pressure (CPAP) 02/11/2018   SOB (shortness of breath) 07/04/2020   Stroke ([place])    Stroke ([place])    Subacute confusional state 10/17/2014   SYMPTOM, HYPERSOMNIA NOS 02/19/2007   Qualifier: Diagnosis of  By: Tilden Dome     Thrombosed hemorrhoids    Tick bite of back 11/23/2019   Tinnitus of both ears 08/29/2020   Trouble swallowing    Unspecified hemorrhoids 02/10/2015   Unspecified sleep apnea 11/05/2012   Varicose veins of lower extremities with other complications 80/99/8338   Vertigo 03/19/2012   Past Surgical History:  Procedure Laterality Date   CATARACT EXTRACTION     x 3   EYE SURGERY  1968, 1985, 1987   cataracts   FLEXIBLE SIGMOIDOSCOPY N/A 02/11/2015   Procedure: FLEXIBLE SIGMOIDOSCOPY;  Surgeon: Gatha Mayer, MD;  Location: [facility];  Service: Endoscopy;  Laterality: N/A;   INNER EAR SURGERY     rt ear   INNER EAR SURGERY  1992   surgery - right arm     1983   Social History   Socioeconomic History   Marital status: Single    Spouse name: Not on file   Number of children: 0   Years of education: Not on file   Highest education level: Not on file  Occupational History   Not on file  Tobacco Use   Smoking status: Never   Smokeless tobacco: Never  Vaping Use   Vaping Use: Never used  Substance and Sexual Activity   Alcohol use: No    Alcohol/week: 0.0 standard drinks    Comment: none   Drug use: No   Sexual activity: Never  Other Topics Concern   Not  on file  Social History Narrative   No  caffeine intake except for chocolate.  Exercised-walking   Social Determinants of Radio broadcast assistant Strain: Not on file  Food Insecurity: Not on file  Transportation Needs: Not on file  Physical Activity: Not on file  Stress: Not on file  Social Connections: Not on file  Intimate Partner Violence: Not on file   Current Outpatient Medications on File Prior to Visit  Medication Sig Dispense Refill   allopurinol (ZYLOPRIM) 100 MG tablet Take 1 tablet (100 mg total) by mouth 2 (two) times daily. 180 tablet 1   amLODipine (NORVASC) 2.5 MG tablet Take 2.5 mg by mouth daily.     azelastine (ASTELIN) 0.1 % nasal spray Place 2 sprays into both nostrils 2 (two) times daily.     Azelastine HCl 0.15 % SOLN TWO SPRAYS EACH NOSTRIL 1-2 TIMES A DAY AS NEEDED. 11 mL 5   clotrimazole-betamethasone (LOTRISONE) cream Apply 1 application topically as needed (rash).     cyclobenzaprine (FLEXERIL) 10 MG tablet TAKE 1 TABLET BY MOUTH TWICE A DAY AS NEEDED FOR MUSCLE SPASM 30 tablet 1   doxycycline (VIBRAMYCIN) 50 MG/5ML SYRP Take 10 mLs (100 mg total) by mouth 2 (two) times daily. 140 mL 0   empagliflozin (JARDIANCE) 25 MG TABS tablet Take 1 tablet (25 mg total) by mouth daily. 90 tablet 3   EPINEPHrine 0.3 mg/0.3 mL IJ SOAJ injection Inject 0.3 mg into the muscle as needed for anaphylaxis.     famotidine (PEPCID) 40 MG/5ML suspension Take 2.5 mLs by mouth daily.     FIBER SELECT GUMMIES PO Take 1 tablet by mouth daily.     finasteride (PROSCAR) 5 MG tablet TAKE 1 TABLET BY MOUTH ONCE DAILY 90 tablet 3   fluticasone (CUTIVATE) 0.05 % cream Apply 1 application topically 2 (two) times daily.     gabapentin (NEURONTIN) 100 MG capsule TAKE 1 CAPSULE BY MOUTH THREE TIMES A DAY 90 capsule 3   glucose blood test strip Use as instructed to check blood sugar 1 daily. 100 each 3   HYDROcodone-acetaminophen (HYCET) 7.5-325 mg/15 ml solution Take 15 mLs by mouth 4 (four) times daily as needed for pain.      hydrocortisone-pramoxine (PROCTOFOAM HC) rectal foam APPLY FOAM THREE TIMES A DAY AS NEEDED FOR 7 DAYS 10 g 2   hydrOXYzine (ATARAX/VISTARIL) 25 MG tablet TAKE 1 TABLET BY MOUTH EVERY 8 HOURS AS NEEDED FOR ANXIETY OR ITCHING 270 tablet 1   losartan (COZAAR) 100 MG tablet Take 1 tablet (100 mg total) by mouth daily. 90 tablet 3   metFORMIN (GLUCOPHAGE) 500 MG tablet TAKE 1 TABLET BY MOUTH 2 TIMES DAILY WITH A MEAL. 180 tablet 0   metoprolol tartrate (LOPRESSOR) 50 MG tablet TAKE 1 TABLET BY MOUTH EVERY DAY 90 tablet 1   mometasone (NASONEX) 50 MCG/ACT nasal spray Place 2 sprays into the nose daily. 17 g 12   Multiple Vitamins-Minerals (AIRBORNE GUMMIES PO) Take 1 each by mouth daily.     polyethylene glycol powder (GLYCOLAX/MIRALAX) 17 GM/SCOOP powder Take 17 g by mouth daily as needed for constipation.     pravastatin (PRAVACHOL) 20 MG tablet TAKE 2 TABLETS (40 MG TOTAL) BY MOUTH DAILY. 180 tablet 1   terazosin (HYTRIN) 5 MG capsule Take 5 mg by mouth daily.     venlafaxine XR (EFFEXOR-XR) 75 MG 24 hr capsule TAKE 1 CAPSULE BY MOUTH DAILY WITH BREAKFAST. 90 capsule 1   VICTOZA 18 MG/3ML  SOPN INJECT 1.8 MG UNDER THE SKIN ONCE A DAY BEFORE BREAKFAST. 9 mL 3   No current facility-administered medications on file prior to visit.   Allergies  Allergen Reactions   Amoxicillin Hives, Itching and Other (See Comments)    White tongue; ? hives   Sulfa Antibiotics Hives and Rash   Terfenadine     ? reaction   Family History  Problem Relation Age of Onset   Breast cancer Mother 68       breast   Huntington's disease Mother    Stroke Father    Heart disease Father    Hypertension Father    Heart attack Father    Hypertension Brother    Hyperlipidemia Neg Hx    Diabetes Neg Hx    PE: There were no vitals taken for this visit. Wt Readings from Last 3 Encounters:  01/11/21 169 lb (76.7 kg)  01/03/21 170 lb 12.8 oz (77.5 kg)  11/22/20 170 lb 9.6 oz (77.4 kg)   Constitutional: Slightly  overweight, in NAD Eyes: PERRLA, EOMI, no exophthalmos ENT: moist mucous membranes, no thyromegaly, no cervical lymphadenopathy Cardiovascular: RRR, No MRG, + mild swelling R LE  Respiratory: CTA B Musculoskeletal: no deformities, strength intact in all 4 Skin: moist, warm, no rashes Neurological: no tremor with outstretched hands, DTR normal in all 4  ASSESSMENT: 1. DM2, non-insulin-dependent, uncontrolled, with complications - PVD - cerebro-vascular ds - s/p "mini"strokes - PN  2. HL  PLAN:  1. Patient with history of uncontrolled type 2 diabetes, on metformin, SGLT2 inhibitor, and daily GLP-1 receptor agonist, with improved control after stopping Januvia and starting Victoza.  At last visit, reviewing his Accu-Chek guide meter which communicated with his phone, sugars are mostly at goal in the morning with only an occasional higher blood sugar in the 130s and 140s.  This mostly happened when he was missing Victoza.  Also, he occasionally forgot the second metformin dose.  He described occasional nausea with Victoza but not enough to desire a change in therapy.  We did not change his regimen.  At that time, HbA1c was lower, at 6.2%. -Of note, a CGM was not approved for him despite repeated requests to his insurance due to Raynaud's phenomenon in his fingers and inability to check blood sugars - I suggested to:  Patient Instructions  Please continue:  - Metformin 500 mg 2x a day - Jardiance 25 mg in am  - Victoza 1.8 mg weekly  Please come back for a follow-up appointment in 4 months.  - we checked his HbA1c: 7%  - advised to check sugars at different times of the day - 1x a day, rotating check times - advised for yearly eye exams >> he is UTD - return to clinic in 3-4 months   2. HL -Reviewed latest lipid panel from last month: LDL only slightly above goal, triglycerides excellent, slightly low HDL: Lab Results  Component Value Date   CHOL 126 11/29/2020   HDL 32.30 (L)  11/29/2020   LDLCALC 74 11/29/2020   LDLDIRECT 56.0 08/21/2020   TRIG 99.0 11/29/2020   CHOLHDL 4 11/29/2020  -continues Pravastatin 40 mg daily w/o SEs  Philemon Kingdom, MD PhD [facility] Endocrinology

## 2021-05-14 ENCOUNTER — Telehealth (INDEPENDENT_AMBULATORY_CARE_PROVIDER_SITE_OTHER): Payer: BC Managed Care – PPO | Admitting: Family Medicine

## 2021-05-14 ENCOUNTER — Other Ambulatory Visit: Payer: Self-pay

## 2021-05-14 VITALS — Temp 98.8°F

## 2021-05-14 DIAGNOSIS — J0140 Acute pansinusitis, unspecified: Secondary | ICD-10-CM

## 2021-05-14 DIAGNOSIS — R051 Acute cough: Secondary | ICD-10-CM | POA: Diagnosis not present

## 2021-05-14 MED ORDER — AZITHROMYCIN 250 MG PO TABS: ORAL_TABLET | ORAL | 0 refills | Status: DC

## 2021-05-14 MED ORDER — CYCLOBENZAPRINE HCL 10 MG PO TABS: ORAL_TABLET | ORAL | 1 refills | Status: DC

## 2021-05-14 MED ORDER — AZITHROMYCIN 250 MG PO TABS: ORAL_TABLET | ORAL | 0 refills | Status: AC

## 2021-05-14 MED ORDER — BENZONATATE 200 MG PO CAPS
200.0000 mg | ORAL_CAPSULE | Freq: Two times a day (BID) | ORAL | 0 refills | Status: DC | PRN
Start: 2021-05-14 — End: 2021-06-28

## 2021-05-14 NOTE — Telephone Encounter (Signed)
Noted. Pt has appointment today.

## 2021-05-14 NOTE — Progress Notes (Signed)
Virtual telephone visit    Virtual Visit via Telephone Note   This visit type was conducted due to national recommendations for restrictions regarding the COVID-19 Pandemic (e.g. social distancing) in an effort to limit this patient's exposure and mitigate transmission in our community. Due to his co-morbid illnesses, this patient is at least at moderate risk for complications without adequate follow up. This format is felt to be most appropriate for this patient at this time. The patient did not have access to video technology or had technical difficulties with video requiring transitioning to audio format only (telephone). Physical exam was limited to content and character of the te<<50% of the duration of the visit, at which time the remainder of the visit was completed via audio only.  Patient location: home with friend  Patient and provider in visit Provider location: Office  I discussed the limitations of evaluation and management by telemedicine and the availability of in person appointments. The patient expressed understanding and agreed to proceed.   Visit Date: 05/14/2021  Today's healthcare provider: Ann Held, DO     Subjective:    Patient ID: Chase [name], male    DOB: 01/08/1961, 60 y.o.   MRN: [id number]  Chief Complaint  Patient presents with   Sinus Problem    HPI Patient is in today for f/u sinusitis.   His sinus ha have gotten worse .   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 ([place]) 03/12/2018   Cerebrovascular accident, old 11/16/2014   Chest discomfort 06/08/2019   Chronic back pain 11/28/2020   Chronic cough 08/27/2014   CXR 08/2014:  Mild hyperinflation, no acute process Arlyce Harman 09/2014:  Difficulty with procedure, but essentially normal +CVA's in past, with aspiration risk by history Speech evaluation 2016:      Chronic left shoulder pain 08/06/2020   Chronic pain syndrome 11/23/2019   Chronic pansinusitis 06/30/2018   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   Constipation    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   DDD (degenerative disc disease), cervical 04/26/2019   DDD (degenerative disc disease), lumbar 04/26/2019   Decreased hearing of both ears 02/12/2013   Deviated septum 05/31/2018   Diabetes mellitus without complication ([place])    Difficulty urinating    DM (diabetes mellitus) type II uncontrolled, periph vascular disorder 01/02/2016   Dry skin 05/09/2019   Dysuria 11/29/2010   Essential (primary) hypertension 01/26/2015   Excessive  daytime sleepiness 02/11/2018   FATIGUE 12/09/2007   Qualifier: Diagnosis of  By: Cori Razor RN, Mikal Plane    Fibromyalgia 03/02/2017   Food intolerance 05/09/2019   GERD 12/09/2007   Qualifier: Diagnosis of  By: Niel Hummer MD, Lorinda Creed    GERD (gastroesophageal reflux disease)    GI bleed 02/10/2015   Headache(784.0)    Hearing loss    Hemangioma 02/12/2013   Hemorrhoid 05/22/2011   HTN (hypertension) 02/04/2010   Qualifier: Diagnosis of  By: Charlett Blake MD,  Stacey     Hyperlipidemia    Hyperlipidemia associated with type 2 diabetes mellitus ([place]) 11/22/2020   Hyperlipidemia LDL goal <70 01/26/2015   Hypersomnia, persistent 11/05/2012   epworth of 18 points, was placed on CPAP in 2008 with very low AHI ( no number  Mentioned) and struggles ever since with PAP. 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   Leg swelling    Localized swelling of both lower legs 03/02/2017   Low back pain 07/20/2007   Qualifier: Diagnosis of  By: Niel Hummer MD, Lorinda Creed    Lower extremity edema 07/04/2020   Lower GI bleed    Mixed conductive and sensorineural hearing loss of right ear with restricted hearing of left ear 10/16/2020   Nasal congestion    Nasal turbinate hypertrophy 05/31/2018   Neck pain 03/15/2020   NEPHROLITHIASIS 08/13/2006   Qualifier: Diagnosis of  By: Beryle Lathe     Nocturia more than twice per night 02/11/2018   OSA on CPAP 10/17/2014   Other chronic pain 03/02/2017   Other fatigue 07/04/2020   Pain in finger of right hand 10/06/2012   Pain in joint of right shoulder 02/21/2017   Palpitations 01/26/2015   Paresthesia 07/25/2010   Persistent atrial fibrillation (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   Qualifier: Diagnosis of  By: Niel Hummer MD, Izell [place] R    PONV (postoperative nausea and vomiting)    Preoperative clearance 07/04/2020   Prolapsed hemorrhoids 10/05/2013   Radiculopathy, lumbar region 03/15/2020   Rash 05/24/2019   Raynaud's disease 03/02/2017   Raynaud's phenomenon 03/02/2017   Rectal bleeding 02/12/2015   Rectal pain    Rectal ulcer with bleeding after hemorrhoid banding  02/11/2015   Rhinitis, chronic 12/23/2006   Qualifier: Diagnosis of  By: Cori Razor RN, Regina G    Right shoulder pain 02/15/2015   Seasonal and perennial allergic rhinitis 05/09/2019   Severe episode of recurrent major depressive disorder, without psychotic features ([place]) 08/29/2020   Sinus infection 06/30/2014   Sinusitis, acute, maxillary 02/02/2014   Sleep apnea    Sleep apnea with use of continuous positive airway pressure (CPAP) 02/11/2018   SOB (shortness of breath) 07/04/2020   Stroke ([place])    Stroke ([place])    Subacute confusional state 10/17/2014   SYMPTOM, HYPERSOMNIA NOS 02/19/2007   Qualifier: Diagnosis of  By: Tilden Dome     Thrombosed hemorrhoids    Tick bite of back 11/23/2019   Tinnitus of both ears 08/29/2020   Trouble swallowing    Unspecified hemorrhoids 02/10/2015   Unspecified sleep apnea 11/05/2012   Varicose veins of lower extremities with other complications 73/22/0254  Vertigo 03/19/2012    Past Surgical History:  Procedure Laterality Date   CATARACT EXTRACTION     x 3   EYE SURGERY  1968, 1985, 1987   cataracts   FLEXIBLE SIGMOIDOSCOPY N/A 02/11/2015   Procedure: FLEXIBLE SIGMOIDOSCOPY;  Surgeon: Gatha Mayer, MD;  Location: [facility];  Service: Endoscopy;  Laterality: N/A;   INNER EAR SURGERY     rt ear   INNER EAR SURGERY  1992   surgery - right arm     1983    Family History  Problem Relation Age of Onset   Breast cancer Mother 87       breast   Huntington's disease Mother    Stroke Father    Heart disease Father    Hypertension Father    Heart attack Father    Hypertension Brother    Hyperlipidemia Neg Hx    Diabetes Neg Hx     Social History   Socioeconomic History   Marital status: Single    Spouse name: Not on file   Number of children: 0   Years of education: Not on file   Highest education level: Not on file  Occupational History   Not on file  Tobacco Use   Smoking status: Never   Smokeless tobacco: Never   Vaping Use   Vaping Use: Never used  Substance and Sexual Activity   Alcohol use: No    Alcohol/week: 0.0 standard drinks    Comment: none   Drug use: No   Sexual activity: Never  Other Topics Concern   Not on file  Social History Narrative   No caffeine intake except for chocolate.  Exercised-walking   Social Determinants of Radio broadcast assistant Strain: Not on file  Food Insecurity: Not on file  Transportation Needs: Not on file  Physical Activity: Not on file  Stress: Not on file  Social Connections: Not on file  Intimate Partner Violence: Not on file    Outpatient Medications Prior to Visit  Medication Sig Dispense Refill   allopurinol (ZYLOPRIM) 100 MG tablet Take 1 tablet (100 mg total) by mouth 2 (two) times daily. 180 tablet 1   amLODipine (NORVASC) 2.5 MG tablet Take 2.5 mg by mouth daily.     azelastine (ASTELIN) 0.1 % nasal spray Place 2 sprays into both nostrils 2 (two) times daily.     Azelastine HCl 0.15 % SOLN TWO SPRAYS EACH NOSTRIL 1-2 TIMES A DAY AS NEEDED. 11 mL 5   clotrimazole-betamethasone (LOTRISONE) cream Apply 1 application topically as needed (rash).     empagliflozin (JARDIANCE) 25 MG TABS tablet Take 1 tablet (25 mg total) by mouth daily. 90 tablet 3   EPINEPHrine 0.3 mg/0.3 mL IJ SOAJ injection Inject 0.3 mg into the muscle as needed for anaphylaxis.     famotidine (PEPCID) 40 MG/5ML suspension Take 2.5 mLs by mouth daily.     FIBER SELECT GUMMIES PO Take 1 tablet by mouth daily.     finasteride (PROSCAR) 5 MG tablet TAKE 1 TABLET BY MOUTH ONCE DAILY 90 tablet 3   fluticasone (CUTIVATE) 0.05 % cream Apply 1 application topically 2 (two) times daily.     gabapentin (NEURONTIN) 100 MG capsule TAKE 1 CAPSULE BY MOUTH THREE TIMES A DAY 90 capsule 3   glucose blood test strip Use as instructed to check blood sugar 1 daily. 100 each 3   HYDROcodone-acetaminophen (HYCET) 7.5-325 mg/15 ml solution Take 15 mLs by mouth 4 (four) times daily  as needed  for pain.     hydrocortisone-pramoxine (PROCTOFOAM HC) rectal foam APPLY FOAM THREE TIMES A DAY AS NEEDED FOR 7 DAYS 10 g 2   hydrOXYzine (ATARAX/VISTARIL) 25 MG tablet TAKE 1 TABLET BY MOUTH EVERY 8 HOURS AS NEEDED FOR ANXIETY OR ITCHING 270 tablet 1   losartan (COZAAR) 100 MG tablet Take 1 tablet (100 mg total) by mouth daily. 90 tablet 3   metFORMIN (GLUCOPHAGE) 500 MG tablet TAKE 1 TABLET BY MOUTH 2 TIMES DAILY WITH A MEAL. 180 tablet 0   metoprolol tartrate (LOPRESSOR) 50 MG tablet TAKE 1 TABLET BY MOUTH EVERY DAY 90 tablet 1   mometasone (NASONEX) 50 MCG/ACT nasal spray Place 2 sprays into the nose daily. 17 g 12   Multiple Vitamins-Minerals (AIRBORNE GUMMIES PO) Take 1 each by mouth daily.     polyethylene glycol powder (GLYCOLAX/MIRALAX) 17 GM/SCOOP powder Take 17 g by mouth daily as needed for constipation.     pravastatin (PRAVACHOL) 20 MG tablet TAKE 2 TABLETS (40 MG TOTAL) BY MOUTH DAILY. 180 tablet 1   terazosin (HYTRIN) 5 MG capsule Take 5 mg by mouth daily.     venlafaxine XR (EFFEXOR-XR) 75 MG 24 hr capsule TAKE 1 CAPSULE BY MOUTH DAILY WITH BREAKFAST. 90 capsule 1   VICTOZA 18 MG/3ML SOPN INJECT 1.8 MG UNDER THE SKIN ONCE A DAY BEFORE BREAKFAST. 9 mL 3   cyclobenzaprine (FLEXERIL) 10 MG tablet TAKE 1 TABLET BY MOUTH TWICE A DAY AS NEEDED FOR MUSCLE SPASM 30 tablet 1   doxycycline (VIBRAMYCIN) 50 MG/5ML SYRP Take 10 mLs (100 mg total) by mouth 2 (two) times daily. 140 mL 0   No facility-administered medications prior to visit.    Allergies  Allergen Reactions   Amoxicillin Hives, Itching and Other (See Comments)    White tongue; ? hives   Sulfa Antibiotics Hives and Rash   Terfenadine     ? reaction    Review of Systems  Constitutional:  Negative for fever and malaise/fatigue.  HENT:  Positive for sinus pain. Negative for congestion and sore throat.   Eyes:  Negative for blurred vision.  Respiratory:  Negative for shortness of breath.   Cardiovascular:  Negative for  chest pain, palpitations and leg swelling.  Gastrointestinal:  Negative for abdominal pain, blood in stool and nausea.  Genitourinary:  Negative for dysuria and frequency.  Musculoskeletal:  Negative for falls.  Skin:  Negative for rash.  Neurological:  Negative for dizziness, loss of consciousness and headaches.  Endo/Heme/Allergies:  Negative for environmental allergies.  Psychiatric/Behavioral:  Negative for depression. The patient is not nervous/anxious.       Objective:    Physical Exam Vitals and nursing note reviewed.  Pulmonary:     Effort: Pulmonary effort is normal.  Psychiatric:        Mood and Affect: Mood normal.        Behavior: Behavior normal.        Thought Content: Thought content normal.        Judgment: Judgment normal.    Temp 98.8 F (37.1 C)  Wt Readings from Last 3 Encounters:  01/11/21 169 lb (76.7 kg)  01/03/21 170 lb 12.8 oz (77.5 kg)  11/22/20 170 lb 9.6 oz (77.4 kg)    Diabetic Foot Exam - Simple   No data filed    Lab Results  Component Value Date   WBC 7.2 07/03/2020   HGB 13.8 07/03/2020   HCT 40.8 07/03/2020   PLT 287.0  07/03/2020   GLUCOSE 109 (H) 11/29/2020   CHOL 126 11/29/2020   TRIG 99.0 11/29/2020   HDL 32.30 (L) 11/29/2020   LDLDIRECT 56.0 08/21/2020   LDLCALC 74 11/29/2020   ALT 22 11/29/2020   AST 17 11/29/2020   NA 142 11/29/2020   K 3.6 11/29/2020   CL 101 11/29/2020   CREATININE 1.02 11/29/2020   BUN 12 11/29/2020   CO2 32 11/29/2020   TSH 4.40 07/03/2020   PSA 1.01 10/03/2014   INR 1.11 02/10/2015   HGBA1C 6.2 (A) 01/03/2021   MICROALBUR <0.7 05/24/2020    Lab Results  Component Value Date   TSH 4.40 07/03/2020   Lab Results  Component Value Date   WBC 7.2 07/03/2020   HGB 13.8 07/03/2020   HCT 40.8 07/03/2020   MCV 87.0 07/03/2020   PLT 287.0 07/03/2020   Lab Results  Component Value Date   NA 142 11/29/2020   K 3.6 11/29/2020   CO2 32 11/29/2020   GLUCOSE 109 (H) 11/29/2020   BUN 12  11/29/2020   CREATININE 1.02 11/29/2020   BILITOT 0.9 11/29/2020   ALKPHOS 77 11/29/2020   AST 17 11/29/2020   ALT 22 11/29/2020   PROT 6.4 11/29/2020   ALBUMIN 4.3 11/29/2020   CALCIUM 9.4 11/29/2020   ANIONGAP 9 02/12/2015   GFR 80.02 11/29/2020   Lab Results  Component Value Date   CHOL 126 11/29/2020   Lab Results  Component Value Date   HDL 32.30 (L) 11/29/2020   Lab Results  Component Value Date   LDLCALC 74 11/29/2020   Lab Results  Component Value Date   TRIG 99.0 11/29/2020   Lab Results  Component Value Date   CHOLHDL 4 11/29/2020   Lab Results  Component Value Date   HGBA1C 6.2 (A) 01/03/2021       Assessment & Plan:   Problem List Items Addressed This Visit       Unprioritized   Sinus infection - Primary   Relevant Medications   azithromycin (ZITHROMAX) 250 MG tablet   benzonatate (TESSALON) 200 MG capsule   Other Visit Diagnoses     Acute cough       Relevant Medications   benzonatate (TESSALON) 200 MG capsule     Con't otc cough med   I have discontinued Chase [name] "Jeff"'s doxycycline. I am also having him start on benzonatate. Additionally, I am having him maintain his [place] PO, Multiple Vitamins-Minerals (AIRBORNE GUMMIES PO), azelastine, polyethylene glycol powder, terazosin, EPINEPHrine, fluticasone, HYDROcodone-acetaminophen, famotidine, clotrimazole-betamethasone, mometasone, hydrOXYzine, finasteride, Proctofoam HC, losartan, pravastatin, empagliflozin, allopurinol, amLODipine, metoprolol tartrate, glucose blood, venlafaxine XR, metFORMIN, gabapentin, Victoza, Azelastine HCl, cyclobenzaprine, and azithromycin.  Meds ordered this encounter  Medications   DISCONTD: azithromycin (ZITHROMAX) 250 MG tablet    Sig: Take 2 tablets on day 1, then 1 tablet daily on days 2 through 5    Dispense:  6 tablet    Refill:  0   cyclobenzaprine (FLEXERIL) 10 MG tablet    Sig: TAKE 1 TABLET BY MOUTH TWICE A DAY AS NEEDED FOR  MUSCLE SPASM    Dispense:  30 tablet    Refill:  1   azithromycin (ZITHROMAX) 250 MG tablet    Sig: Take 2 tablets on day 1, then 1 tablet daily on days 2 through 5    Dispense:  6 tablet    Refill:  0   benzonatate (TESSALON) 200 MG capsule    Sig: Take 1 capsule (200  mg total) by mouth 2 (two) times daily as needed for cough.    Dispense:  20 capsule    Refill:  0     I discussed the assessment and treatment plan with the patient. The patient was provided an opportunity to ask questions and all were answered. The patient agreed with the plan and demonstrated an understanding of the instructions.   The patient was advised to call back or seek an in-person evaluation if the symptoms worsen or if the condition fails to improve as anticipated.  I provided 40 minutes of non-face-to-face time during this encounter.   Ann Held, DO [place] [facility] [phone] (phone) [phone] (fax)  [place]

## 2021-05-16 ENCOUNTER — Encounter: Payer: Self-pay | Admitting: Family Medicine

## 2021-05-21 ENCOUNTER — Telehealth: Payer: Self-pay | Admitting: Family Medicine

## 2021-05-21 NOTE — Telephone Encounter (Signed)
Pt stated he finished antibiotics and it still having major sinus pressure and does not want to wait to be seen since he is in a lot of pain. He was wondering if he could get some new medication or some advice on what to do please advise.

## 2021-05-22 MED ORDER — HYDROXYZINE HCL 25 MG PO TABS: ORAL_TABLET | ORAL | 1 refills | Status: DC

## 2021-05-22 MED ORDER — DOXYCYCLINE HYCLATE 100 MG PO TABS: 100.0000 mg | ORAL_TABLET | Freq: Two times a day (BID) | ORAL | 0 refills | Status: DC

## 2021-05-22 NOTE — Telephone Encounter (Signed)
Spoke with patient. Pt made aware

## 2021-05-23 ENCOUNTER — Encounter: Payer: Self-pay | Admitting: Family Medicine

## 2021-05-29 ENCOUNTER — Other Ambulatory Visit (HOSPITAL_BASED_OUTPATIENT_CLINIC_OR_DEPARTMENT_OTHER): Payer: Self-pay

## 2021-05-29 ENCOUNTER — Encounter: Payer: Self-pay | Admitting: Family Medicine

## 2021-05-29 ENCOUNTER — Ambulatory Visit (INDEPENDENT_AMBULATORY_CARE_PROVIDER_SITE_OTHER): Payer: BC Managed Care – PPO | Admitting: Family Medicine

## 2021-05-29 VITALS — BP 130/82 | HR 93 | Temp 98.3°F | Ht 70.0 in | Wt 171.2 lb

## 2021-05-29 DIAGNOSIS — J0140 Acute pansinusitis, unspecified: Secondary | ICD-10-CM

## 2021-05-29 MED ORDER — PREDNISONE 20 MG PO TABS: 40.0000 mg | ORAL_TABLET | Freq: Every day | ORAL | 0 refills | Status: DC

## 2021-05-29 MED ORDER — PREDNISOLONE SODIUM PHOSPHATE 15 MG/5ML PO SOLN
45.0000 mg | Freq: Every day | ORAL | 0 refills | Status: DC
  Filled 2021-05-29: qty 100, 6d supply, fill #0

## 2021-05-29 MED ORDER — KETOROLAC TROMETHAMINE 60 MG/2ML IM SOLN
60.0000 mg | Freq: Once | INTRAMUSCULAR | Status: AC
  Administered 2021-05-29: 15:00:00 60 mg via INTRAMUSCULAR

## 2021-05-29 NOTE — Progress Notes (Signed)
Chief Complaint  Patient presents with   Headache    Sinus congestion     [name] here for URI complaints. Called GF during visit 2/2 aphasia.   Duration: 8 weeks  Associated symptoms: sinus headache, sinus pain, and ear pain Denies: Fever, sinus congestion, rhinorrhea, itchy watery eyes, ear drainage, sore throat, wheezing, shortness of breath, myalgia, and coughing, N/V, dental pain, fevers Treatment to date: Zpak, Doxycycline, Tessalon Perles Sick contacts: No  Past Medical History:  Diagnosis Date   Abnormality of gait 11/10/2012   Acute GI bleeding    Acute renal insufficiency 02/10/2015   Acute upper respiratory infection 08/10/2012   Allergic drug reaction 05/24/2020   Allergic rhinitis 10/16/2020   Allergy    Anemia 02/10/2015   Annual physical exam 11/26/2011   ANXIETY DEPRESSION 12/09/2007   Qualifier: Diagnosis of  By: Cori Razor RN, Mikal Plane    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 10/17/2014   Arthralgia 07/25/2010   Arthritis    Benign neoplasm of brain ([place]) 05/24/2019   Bigeminy ? 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 ([place]) 03/12/2018   Cerebrovascular accident, old 11/16/2014   Chest discomfort 06/08/2019   Chronic back pain 11/28/2020   Chronic cough 08/27/2014   CXR 08/2014:  Mild hyperinflation, no acute process Arlyce Harman 09/2014:  Difficulty with procedure, but essentially normal +CVA's in past, with aspiration risk by history Speech evaluation 2016:      Chronic left shoulder pain 08/06/2020   Chronic pain syndrome 11/23/2019   Chronic pansinusitis 06/30/2018   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   Constipation    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    DDD (degenerative disc disease), cervical 04/26/2019   DDD (degenerative disc disease), lumbar 04/26/2019   Decreased hearing of both ears 02/12/2013   Deviated septum 05/31/2018   Diabetes mellitus without complication ([place])    Difficulty urinating    DM (diabetes mellitus) type II uncontrolled, periph vascular disorder 01/02/2016   Dry skin 05/09/2019   Dysuria 11/29/2010   Essential (primary) hypertension 01/26/2015   Excessive daytime sleepiness 02/11/2018   FATIGUE 12/09/2007   Qualifier: Diagnosis of  By: Cori Razor RN, Mikal Plane    Fibromyalgia 03/02/2017   Food intolerance 05/09/2019   GERD 12/09/2007   Qualifier: Diagnosis of  By: Niel Hummer MD, Lorinda Creed    GERD (gastroesophageal reflux disease)    GI bleed 02/10/2015   Headache(784.0)    Hearing loss    Hemangioma 02/12/2013   Hemorrhoid 05/22/2011   HTN (hypertension) 02/04/2010   Qualifier: Diagnosis of  By: Charlett Blake MD, Stacey     Hyperlipidemia    Hyperlipidemia associated with type 2 diabetes mellitus ([place]) 11/22/2020   Hyperlipidemia LDL goal <70 01/26/2015   Hypersomnia, persistent 11/05/2012   epworth of 18 points, was placed on CPAP in 2008 with very low AHI ( no number  Mentioned) and struggles ever since with PAP. 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   Leg swelling    Localized swelling of both lower legs 03/02/2017   Low back pain 07/20/2007   Qualifier: Diagnosis of  By: Niel Hummer MD, Lorinda Creed    Lower extremity edema 07/04/2020   Lower GI bleed    Mixed conductive and sensorineural hearing loss of right ear with restricted hearing of left ear 10/16/2020   Nasal congestion    Nasal turbinate hypertrophy  05/31/2018   Neck pain 03/15/2020   NEPHROLITHIASIS 08/13/2006   Qualifier: Diagnosis of  By: Beryle Lathe     Nocturia more than twice per night 02/11/2018   OSA on CPAP 10/17/2014   Other chronic pain 03/02/2017   Other fatigue 07/04/2020   Pain in finger of right hand 10/06/2012   Pain in joint of right shoulder 02/21/2017   Palpitations 01/26/2015   Paresthesia 07/25/2010   Persistent atrial fibrillation ([place]) 08/29/2020   Persistent headaches 10/17/2014   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   Qualifier: Diagnosis of  By: Niel Hummer MD, Izell [place] R    PONV (postoperative nausea and vomiting)    Preoperative clearance 07/04/2020   Prolapsed hemorrhoids 10/05/2013   Radiculopathy, lumbar region 03/15/2020   Rash 05/24/2019   Raynaud's disease 03/02/2017   Raynaud's phenomenon 03/02/2017   Rectal bleeding 02/12/2015   Rectal pain    Rectal ulcer with bleeding after hemorrhoid banding 02/11/2015   Rhinitis, chronic 12/23/2006   Qualifier: Diagnosis of  By: Cori Razor RN, Regina G    Right shoulder pain 02/15/2015   Seasonal and perennial allergic rhinitis 05/09/2019   Severe episode of recurrent major depressive disorder, without psychotic features ([place]) 08/29/2020   Sinus infection 06/30/2014   Sinusitis, acute, maxillary 02/02/2014   Sleep apnea    Sleep apnea with use of continuous positive airway pressure (CPAP) 02/11/2018   SOB (shortness of breath) 07/04/2020   Stroke ([place])    Stroke ([place])    Subacute confusional state 10/17/2014   SYMPTOM, HYPERSOMNIA NOS 02/19/2007   Qualifier: Diagnosis of  By: Tilden Dome     Thrombosed hemorrhoids    Tick bite of back 11/23/2019   Tinnitus of both ears 08/29/2020   Trouble swallowing    Unspecified hemorrhoids 02/10/2015   Unspecified sleep apnea 11/05/2012   Varicose veins of lower extremities with other complications 74/94/4967   Vertigo 03/19/2012    Objective BP 130/82    Pulse 93    Temp 98.3 F (36.8 C) (Oral)    Ht 5'  10" (1.778 m)    Wt 171 lb 4 oz (77.7 kg)    SpO2 99%    BMI 24.57 kg/m  General: Awake, alert, appears stated age HEENT: AT, , ears patent b/l and TM's retracted bilaterally without fluid or purulence, there is no erythema, nares patent w/o discharge, pharynx pink and without exudates, TTP over the frontal sinuses bilaterally, some tenderness over the maxillary sinuses bilaterally though less so compared to the frontal; MMM Neck: No masses or asymmetry Heart: RRR Lungs: CTAB, no accessory muscle use Psych: Age appropriate judgment and insight, normal mood and affect  Subacute pansinusitis - Plan: prednisoLONE (ORAPRED) 15 MG/5ML solution, ketorolac (TORADOL) injection 60 mg  Toradol injection today.  Acute migraine is moving up the list on the differential given his failure of 3 different antibiotics.  Finish the doxycycline.  Start prednisolone tomorrow.  Follow-up with PCP in around 8 or 9 days.  He will let me know if  his sugar spike, prednisolone. Pt and his GF voiced understanding and agreement to the plan.  [place], DO 05/29/21 3:16 PM

## 2021-05-29 NOTE — Patient Instructions (Addendum)
Continue to push fluids, practice good hand hygiene, and cover your mouth if you cough.  If you start having fevers, shaking or shortness of breath, seek immediate care.  If you do not hear anything about your referral in the next 1-2 weeks, call our office and ask for an update.  OK to take Tylenol 1000 mg (2 extra strength tabs) or 975 mg (3 regular strength tabs) every 6 hours as needed.  Start the prednisolone tomorrow.   Let us know if you need anything.

## 2021-05-30 DIAGNOSIS — G894 Chronic pain syndrome: Secondary | ICD-10-CM | POA: Diagnosis not present

## 2021-05-30 DIAGNOSIS — M549 Dorsalgia, unspecified: Secondary | ICD-10-CM | POA: Diagnosis not present

## 2021-05-30 DIAGNOSIS — Z79891 Long term (current) use of opiate analgesic: Secondary | ICD-10-CM | POA: Diagnosis not present

## 2021-05-30 DIAGNOSIS — M5030 Other cervical disc degeneration, unspecified cervical region: Secondary | ICD-10-CM | POA: Diagnosis not present

## 2021-05-30 DIAGNOSIS — M47812 Spondylosis without myelopathy or radiculopathy, cervical region: Secondary | ICD-10-CM | POA: Diagnosis not present

## 2021-05-30 DIAGNOSIS — Z79899 Other long term (current) drug therapy: Secondary | ICD-10-CM | POA: Diagnosis not present

## 2021-05-30 DIAGNOSIS — M5134 Other intervertebral disc degeneration, thoracic region: Secondary | ICD-10-CM | POA: Diagnosis not present

## 2021-05-30 DIAGNOSIS — G9589 Other specified diseases of spinal cord: Secondary | ICD-10-CM | POA: Diagnosis not present

## 2021-06-01 ENCOUNTER — Other Ambulatory Visit: Payer: Self-pay | Admitting: Internal Medicine

## 2021-06-03 ENCOUNTER — Other Ambulatory Visit: Payer: Self-pay

## 2021-06-03 ENCOUNTER — Encounter: Payer: Self-pay | Admitting: Internal Medicine

## 2021-06-03 ENCOUNTER — Ambulatory Visit (INDEPENDENT_AMBULATORY_CARE_PROVIDER_SITE_OTHER): Payer: BC Managed Care – PPO | Admitting: Internal Medicine

## 2021-06-03 VITALS — BP 122/78 | HR 104 | Ht 70.0 in | Wt 167.8 lb

## 2021-06-03 DIAGNOSIS — E1159 Type 2 diabetes mellitus with other circulatory complications: Secondary | ICD-10-CM | POA: Diagnosis not present

## 2021-06-03 DIAGNOSIS — E1165 Type 2 diabetes mellitus with hyperglycemia: Secondary | ICD-10-CM

## 2021-06-03 DIAGNOSIS — E785 Hyperlipidemia, unspecified: Secondary | ICD-10-CM | POA: Diagnosis not present

## 2021-06-03 LAB — POCT GLYCOSYLATED HEMOGLOBIN (HGB A1C): Hemoglobin A1C: 6.7 % — AB (ref 4.0–5.6)

## 2021-06-03 MED ORDER — METFORMIN HCL 500 MG PO TABS: 500.0000 mg | ORAL_TABLET | Freq: Two times a day (BID) | ORAL | 3 refills | Status: DC

## 2021-06-03 MED ORDER — INSULIN PEN NEEDLE 32G X 4 MM MISC: 3 refills | Status: DC

## 2021-06-03 NOTE — Patient Instructions (Addendum)
Please increase:  - Metformin 500 mg 2x a day  Continue: - Jardiance 25 mg in am  - Victoza 1.8 mg weekly  Check with your insurance if the following are covered instead of Victoza: - Ozempic - Trulicity - Mounjaro  Please come back for a follow-up appointment in 4 months.

## 2021-06-03 NOTE — Progress Notes (Signed)
Patient ID: Chase [name], male   DOB: 10-28-1960, 60 y.o.   MRN: [id number]  This visit occurred during the SARS-CoV-2 public health emergency.  Safety protocols were in place, including screening questions prior to the visit, additional usage of staff PPE, and extensive cleaning of exam room while observing appropriate contact time as indicated for disinfecting solutions.   HPI: Chase [name] is a 60 y.o.-year-old male, presenting for follow-up for DM2, dx in 2014, non-insulin-dependent, uncontrolled, with complications (PVD, cerebro-vascular ds - s/p "mini"strokes, PN). Last visit 5 months ago. He now has [employer] but he is not sure whether he could afford it after the first of the year.  He may just be on Medicaid afterwards.  Interim history: He has blurry vision.  No increased urination, chest pain.  He has sinusitis - has ear pain, congestion, HAs >> was on ABx and a steroid taper. Sugars were higher. He has more anxiety.   HbA1c levels reviewed: Lab Results  Component Value Date   HGBA1C 6.2 (A) 01/03/2021   HGBA1C 6.7 (A) 09/10/2020   HGBA1C 8.2 (H) 05/30/2020   HGBA1C 8.3 (H) 05/24/2020   HGBA1C 9.7 (H) 04/05/2020   HGBA1C 8.3 (H) 05/24/2019   HGBA1C 7.9 (A) 08/04/2018   HGBA1C 7.5 (A) 12/21/2017   HGBA1C 7.4 09/17/2017   HGBA1C 7.8 06/19/2017   HGBA1C 7.1 (H) 02/19/2017   HGBA1C 7.4 02/21/2016   HGBA1C 9.8 (H) 11/13/2015   HGBA1C 6.8 (H) 02/11/2015   HGBA1C 6.8 (H) 10/03/2014   HGBA1C 6.6 (H) 05/26/2014   HGBA1C 6.4 06/03/2013   HGBA1C 6.7 (H) 09/17/2012   HGBA1C 6.2 03/22/2012   HGBA1C 6.2 11/03/2011   Pt is on a regimen of:  - Metformin 500 mg 1-2 >> taking only 1x a day  - Jardiance 25 mg in am  - Januvia 100 mg in am >> Victoza 1.8 mg weekly-started 05/2020 In 11/2018, we tried to change from Januvia to [place] but this was not covered. Stopped Glipizide b/c rash. Stopped Invokana 100 mg in am >> only got this for 1 mo He was on Metformin 500  mg po bid - in liquid form, as he could not swallow pills. He gets gi upset. He stopped this in 2014.  He is not checking sugars-fingerstick values are not accurate due to Raynaud's syndrome, however, freestyle libre CGM was not approved by his insurance.  He checks sugars approximately 1-2x a day: - brunch: 157, 181  >> 111-135, 190 >> 108-134, 144 >> 115-146 - 2h after brunch: n/c >> 206, 243 >> 119 >> 116>> 147, 154, 217 - before dinner: 150-200 >> n/c >> 115 >> 109, 149 >> see above - 2h after dinner:  180-210 >> 260 >> n/c - bedtime: 208 >> ? >> 164, 173 >> 121 >> n/c >> 145 - nighttime: n/c >> 108-138, 151 Lowest sugar was 157 >> 111 >> 108>> 115;  he has hypoglycemia awareness in the 70s. Highest sugar was 260 >> 190 >> 149 >> 217.  Glucometer: [employer] Next >> AccuChek guide  In the past, he was occasional lemonade and ginger ale >> advised to stop - still drinking these but rarely.  -+ Mild CKD, last BUN/creatinine:  Lab Results  Component Value Date   BUN 12 11/29/2020   BUN 9 08/28/2020   CREATININE 1.02 11/29/2020   CREATININE 0.93 08/28/2020   -+ HL.  last set of lipids: Lab Results  Component Value Date   CHOL  126 11/29/2020   HDL 32.30 (L) 11/29/2020   LDLCALC 74 11/29/2020   LDLDIRECT 56.0 08/21/2020   TRIG 99.0 11/29/2020   CHOLHDL 4 11/29/2020  On pravastatin 40.  - last eye exam was in 2021: No DR reportedly.  Dr. Jabier Mutton.  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 Has varicose veins >> wears compresion hoses. He also has a h/o kidney stones.  On allopurinol for gout. He has anxiety, PTSD from childhood.  Early in 2022, he was diagnosed with an intradural extramedullary spinal tumor.  It did not change in appearance on a recent MRI.  ROS: + See HPI.  Past Medical History:  Diagnosis Date   Abnormality of gait 11/10/2012   Acute GI bleeding     Acute renal insufficiency 02/10/2015   Acute upper respiratory infection 08/10/2012   Allergic drug reaction 05/24/2020   Allergic rhinitis 10/16/2020   Allergy    Anemia 02/10/2015   Annual physical exam 11/26/2011   ANXIETY DEPRESSION 12/09/2007   Qualifier: Diagnosis of  By: Cori Razor RN, Mikal Plane    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 10/17/2014   Arthralgia 07/25/2010   Arthritis    Benign neoplasm of brain ([place]) 05/24/2019   Bigeminy ? 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 ([place]) 03/12/2018   Cerebrovascular accident, old 11/16/2014   Chest discomfort 06/08/2019   Chronic back pain 11/28/2020   Chronic cough 08/27/2014   CXR 08/2014:  Mild hyperinflation, no acute process Arlyce Harman 09/2014:  Difficulty with procedure, but essentially normal +CVA's in past, with aspiration risk by history Speech evaluation 2016:      Chronic left shoulder pain 08/06/2020   Chronic pain syndrome 11/23/2019   Chronic pansinusitis 06/30/2018   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   Constipation    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   DDD (degenerative disc disease), cervical 04/26/2019   DDD (degenerative disc disease), lumbar 04/26/2019   Decreased hearing of both ears 02/12/2013   Deviated septum 05/31/2018   Diabetes mellitus without complication ([place])    Difficulty urinating    DM (diabetes mellitus) type II uncontrolled, periph vascular disorder 01/02/2016   Dry skin 05/09/2019   Dysuria 11/29/2010   Essential (primary) hypertension 01/26/2015   Excessive daytime sleepiness 02/11/2018   FATIGUE 12/09/2007   Qualifier: Diagnosis of  By: Cori Razor RN, Mikal Plane    Fibromyalgia 03/02/2017   Food  intolerance 05/09/2019   GERD 12/09/2007   Qualifier: Diagnosis of  By: Niel Hummer MD, Lorinda Creed    GERD (gastroesophageal reflux disease)    GI bleed 02/10/2015   Headache(784.0)    Hearing loss    Hemangioma 02/12/2013   Hemorrhoid 05/22/2011   HTN (hypertension) 02/04/2010   Qualifier: Diagnosis of  By: Charlett Blake MD, Stacey     Hyperlipidemia    Hyperlipidemia associated with type 2 diabetes mellitus ([place]) 11/22/2020   Hyperlipidemia LDL goal <70 01/26/2015   Hypersomnia, persistent 11/05/2012   epworth of 18 points, was placed on CPAP in 2008 with very low AHI (  no number  Mentioned) and struggles ever since with PAP. 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   Leg swelling    Localized swelling of both lower legs 03/02/2017   Low back pain 07/20/2007   Qualifier: Diagnosis of  By: Niel Hummer MD, Lorinda Creed    Lower extremity edema 07/04/2020   Lower GI bleed    Mixed conductive and sensorineural hearing loss of right ear with restricted hearing of left ear 10/16/2020   Nasal congestion    Nasal turbinate hypertrophy 05/31/2018   Neck pain 03/15/2020   NEPHROLITHIASIS 08/13/2006   Qualifier: Diagnosis of  By: Beryle Lathe     Nocturia more than twice per night 02/11/2018   OSA on CPAP 10/17/2014   Other chronic pain 03/02/2017   Other fatigue 07/04/2020   Pain in finger of right hand 10/06/2012   Pain in joint of right shoulder 02/21/2017   Palpitations 01/26/2015   Paresthesia 07/25/2010   Persistent atrial fibrillation ([place]) 08/29/2020   Persistent headaches 10/17/2014   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   Qualifier: Diagnosis of  By: Niel Hummer MD, Izell Llano R    PONV  (postoperative nausea and vomiting)    Preoperative clearance 07/04/2020   Prolapsed hemorrhoids 10/05/2013   Radiculopathy, lumbar region 03/15/2020   Rash 05/24/2019   Raynaud's disease 03/02/2017   Raynaud's phenomenon 03/02/2017   Rectal bleeding 02/12/2015   Rectal pain    Rectal ulcer with bleeding after hemorrhoid banding 02/11/2015   Rhinitis, chronic 12/23/2006   Qualifier: Diagnosis of  By: Cori Razor RN, Regina G    Right shoulder pain 02/15/2015   Seasonal and perennial allergic rhinitis 05/09/2019   Severe episode of recurrent major depressive disorder, without psychotic features ([place]) 08/29/2020   Sinus infection 06/30/2014   Sinusitis, acute, maxillary 02/02/2014   Sleep apnea    Sleep apnea with use of continuous positive airway pressure (CPAP) 02/11/2018   SOB (shortness of breath) 07/04/2020   Stroke ([place])    Stroke ([place])    Subacute confusional state 10/17/2014   SYMPTOM, HYPERSOMNIA NOS 02/19/2007   Qualifier: Diagnosis of  By: Tilden Dome     Thrombosed hemorrhoids    Tick bite of back 11/23/2019   Tinnitus of both ears 08/29/2020   Trouble swallowing    Unspecified hemorrhoids 02/10/2015   Unspecified sleep apnea 11/05/2012   Varicose veins of lower extremities with other complications 47/82/9562   Vertigo 03/19/2012   Past Surgical History:  Procedure Laterality Date   CATARACT EXTRACTION     x 3   EYE SURGERY  1968, 1985, 1987   cataracts   FLEXIBLE SIGMOIDOSCOPY N/A 02/11/2015   Procedure: FLEXIBLE SIGMOIDOSCOPY;  Surgeon: Gatha Mayer, MD;  Location: [facility];  Service: Endoscopy;  Laterality: N/A;   INNER EAR SURGERY     rt ear   INNER EAR SURGERY  1992   surgery - right arm     1983   Social History   Socioeconomic History   Marital status: Single    Spouse name: Not on file   Number of children: 0   Years of education: Not on file   Highest education level: Not on file  Occupational History  Not on file  Tobacco Use   Smoking  status: Never   Smokeless tobacco: Never  Vaping Use   Vaping Use: Never used  Substance and Sexual Activity   Alcohol use: No    Alcohol/week: 0.0 standard drinks    Comment: none   Drug use: No   Sexual activity: Never  Other Topics Concern   Not on file  Social History Narrative   No caffeine intake except for chocolate.  Exercised-walking   Social Determinants of Radio broadcast assistant Strain: Not on file  Food Insecurity: Not on file  Transportation Needs: Not on file  Physical Activity: Not on file  Stress: Not on file  Social Connections: Not on file  Intimate Partner Violence: Not on file   Current Outpatient Medications on File Prior to Visit  Medication Sig Dispense Refill   allopurinol (ZYLOPRIM) 100 MG tablet Take 1 tablet (100 mg total) by mouth 2 (two) times daily. 180 tablet 1   amLODipine (NORVASC) 2.5 MG tablet Take 2.5 mg by mouth daily.     azelastine (ASTELIN) 0.1 % nasal spray Place 2 sprays into both nostrils 2 (two) times daily.     Azelastine HCl 0.15 % SOLN TWO SPRAYS EACH NOSTRIL 1-2 TIMES A DAY AS NEEDED. 11 mL 5   benzonatate (TESSALON) 200 MG capsule Take 1 capsule (200 mg total) by mouth 2 (two) times daily as needed for cough. 20 capsule 0   clotrimazole-betamethasone (LOTRISONE) cream Apply 1 application topically as needed (rash).     cyclobenzaprine (FLEXERIL) 10 MG tablet TAKE 1 TABLET BY MOUTH TWICE A DAY AS NEEDED FOR MUSCLE SPASM 30 tablet 1   empagliflozin (JARDIANCE) 25 MG TABS tablet Take 1 tablet (25 mg total) by mouth daily. 90 tablet 3   EPINEPHrine 0.3 mg/0.3 mL IJ SOAJ injection Inject 0.3 mg into the muscle as needed for anaphylaxis.     famotidine (PEPCID) 40 MG/5ML suspension Take 2.5 mLs by mouth daily.     FIBER SELECT GUMMIES PO Take 1 tablet by mouth daily.     finasteride (PROSCAR) 5 MG tablet TAKE 1 TABLET BY MOUTH ONCE DAILY 90 tablet 3   fluticasone (CUTIVATE) 0.05 % cream Apply 1 application topically 2 (two) times  daily.     gabapentin (NEURONTIN) 100 MG capsule TAKE 1 CAPSULE BY MOUTH THREE TIMES A DAY 90 capsule 3   glucose blood test strip Use as instructed to check blood sugar 1 daily. 100 each 3   HYDROcodone-acetaminophen (HYCET) 7.5-325 mg/15 ml solution Take 15 mLs by mouth 4 (four) times daily as needed for pain.     hydrocortisone-pramoxine (PROCTOFOAM HC) rectal foam APPLY FOAM THREE TIMES A DAY AS NEEDED FOR 7 DAYS 10 g 2   hydrOXYzine (ATARAX) 25 MG tablet TAKE 1 TABLET BY MOUTH EVERY 8 HOURS AS NEEDED FOR ANXIETY OR ITCHING 270 tablet 1   losartan (COZAAR) 100 MG tablet Take 1 tablet (100 mg total) by mouth daily. 90 tablet 3   metFORMIN (GLUCOPHAGE) 500 MG tablet TAKE 1 TABLET BY MOUTH 2 TIMES DAILY WITH A MEAL. 180 tablet 0   metoprolol tartrate (LOPRESSOR) 50 MG tablet TAKE 1 TABLET BY MOUTH EVERY DAY 90 tablet 1   mometasone (NASONEX) 50 MCG/ACT nasal spray Place 2 sprays into the nose daily. 17 g 12   Multiple Vitamins-Minerals (AIRBORNE GUMMIES PO) Take 1 each by mouth daily.     polyethylene glycol powder (GLYCOLAX/MIRALAX) 17 GM/SCOOP powder Take 17 g by  mouth daily as needed for constipation.     pravastatin (PRAVACHOL) 20 MG tablet TAKE 2 TABLETS (40 MG TOTAL) BY MOUTH DAILY. 180 tablet 1   prednisoLONE (ORAPRED) 15 MG/5ML solution Take 15 mLs (45 mg total) by mouth daily before breakfast. 100 mL 0   terazosin (HYTRIN) 5 MG capsule Take 5 mg by mouth daily.     venlafaxine XR (EFFEXOR-XR) 75 MG 24 hr capsule TAKE 1 CAPSULE BY MOUTH DAILY WITH BREAKFAST. 90 capsule 1   VICTOZA 18 MG/3ML SOPN INJECT 1.8 MG UNDER THE SKIN ONCE A DAY BEFORE BREAKFAST. 9 mL 3   No current facility-administered medications on file prior to visit.   Allergies  Allergen Reactions   Amoxicillin Hives, Itching and Other (See Comments)    White tongue; ? hives   Sulfa Antibiotics Hives and Rash   Terfenadine     ? reaction   Family History  Problem Relation Age of Onset   Breast cancer Mother 42        breast   Huntington's disease Mother    Stroke Father    Heart disease Father    Hypertension Father    Heart attack Father    Hypertension Brother    Hyperlipidemia Neg Hx    Diabetes Neg Hx    PE: BP 122/78 (BP Location: Right Arm, Patient Position: Sitting, Cuff Size: Normal)    Pulse (!) 104    Ht 5\' 10"  (1.778 m)    Wt 167 lb 12.8 oz (76.1 kg)    SpO2 98%    BMI 24.08 kg/m  Wt Readings from Last 3 Encounters:  06/03/21 167 lb 12.8 oz (76.1 kg)  05/29/21 171 lb 4 oz (77.7 kg)  01/11/21 169 lb (76.7 kg)   Constitutional: Slightly overweight, in NAD Eyes: PERRLA, EOMI, no exophthalmos ENT: moist mucous membranes, no thyromegaly, no cervical lymphadenopathy Cardiovascular: RRR, No MRG, + mild swelling and pain on palpation in B LE  -wears compression hoses Respiratory: CTA B Musculoskeletal: no deformities, strength intact in all 4 Skin: moist, warm, no rashes Neurological: no tremor with outstretched hands, DTR normal in all 4  ASSESSMENT: 1. DM2, non-insulin-dependent, uncontrolled, with complications - PVD - cerebro-vascular ds - s/p "mini"strokes - PN  2. HL  PLAN:  1. Patient with history of uncontrolled type 2 diabetes, on metformin, SGLT2 inhibitor and now also daily GLP-1 receptor agonist, with improved control after stopping Januvia and starting Victoza.  We were not successful to get him on a weekly GLP-1 receptor agonist due to insurance coverage.  Also, we were not successful to get him a CGM which was repeatedly denied by his insurance, despite the fact that he has Raynaud's phenomenon in his fingers and it is difficult to check blood sugars by fingerstick.  He now has an Accu-Chek guide meter which communicates with his phone. -At last visit, sugars were at goal in the morning with only occasional higher blood sugars in the 130s and 140s when missing Victoza or metformin.  He did have some nausea occasionally with Victoza but not enough to desire a change in  therapy.  HbA1c was better, at 6.2%. -At today's visit, sugars appear to be slightly higher, with an occasional spike in the 200s, now on steroids.  He is on a prednisone taper which he will finish in 2 days.  However, even with the prednisone dosing, sugars are not much higher.  We discussed about possibly stopping cough syrup, which has a lot of sugar  and try to switch to diabetic syrup if needed.  I also advised him to increase the metformin dose back to twice a day, since he decreased the dose to once a day since last visit (he is not sure why).  I advised him to take it with meals. -We discussed about the possibility of coming off [facility] after the first of the year.  I am not sure whether he can obtain Jardiance through [facility] but we could try a PA or the patient assistance program.  I believe Victoza is covered under Medicaid, but will need to check with his particular plan.\ - I suggested to:  Patient Instructions  Please increase:  - Metformin 500 mg 2x a day  Continue: - Jardiance 25 mg in am  - Victoza 1.8 mg weekly  Check with your insurance if the following are covered instead of Victoza: - Ozempic - Trulicity - Mounjaro  Please come back for a follow-up appointment in 4 months.  - we checked his HbA1c: 6.7% (slightly higher) - advised to check sugars at different times of the day - 1x a day, rotating check times - advised for yearly eye exams >> he is not UTD - return to clinic in 4 months  2. HL -Reviewed the latest lipid panel from 11/2020: LDL only slightly above goal, HDL low: Lab Results  Component Value Date   CHOL 126 11/29/2020   HDL 32.30 (L) 11/29/2020   LDLCALC 74 11/29/2020   LDLDIRECT 56.0 08/21/2020   TRIG 99.0 11/29/2020   CHOLHDL 4 11/29/2020  -He continues on pravastatin 40 mg daily without side effects  Philemon Kingdom, MD PhD [facility] Endocrinology

## 2021-06-04 ENCOUNTER — Ambulatory Visit
Admission: RE | Admit: 2021-06-04 | Discharge: 2021-06-04 | Disposition: A | Discharge status: Home / Self Care | Payer: BC Managed Care – PPO | Source: Ambulatory Visit | Attending: Neurosurgery | Admitting: Neurosurgery

## 2021-06-04 DIAGNOSIS — R531 Weakness: Secondary | ICD-10-CM | POA: Diagnosis not present

## 2021-06-04 DIAGNOSIS — D497 Neoplasm of unspecified behavior of endocrine glands and other parts of nervous system: Secondary | ICD-10-CM

## 2021-06-04 MED ORDER — GADOBENATE DIMEGLUMINE 529 MG/ML IV SOLN
15.0000 mL | Freq: Once | INTRAVENOUS | Status: AC | PRN
  Administered 2021-06-04: 11:00:00 15 mL via INTRAVENOUS

## 2021-06-05 ENCOUNTER — Other Ambulatory Visit: Payer: Self-pay | Admitting: Family Medicine

## 2021-06-05 DIAGNOSIS — E785 Hyperlipidemia, unspecified: Secondary | ICD-10-CM

## 2021-06-06 ENCOUNTER — Ambulatory Visit (INDEPENDENT_AMBULATORY_CARE_PROVIDER_SITE_OTHER): Payer: BC Managed Care – PPO | Admitting: Family Medicine

## 2021-06-06 ENCOUNTER — Other Ambulatory Visit: Payer: Self-pay | Admitting: Family Medicine

## 2021-06-06 ENCOUNTER — Encounter: Payer: Self-pay | Admitting: Family Medicine

## 2021-06-06 VITALS — BP 124/80 | HR 92 | Temp 98.4°F | Resp 20 | Ht 70.0 in | Wt 169.4 lb

## 2021-06-06 DIAGNOSIS — J0141 Acute recurrent pansinusitis: Secondary | ICD-10-CM | POA: Diagnosis not present

## 2021-06-06 DIAGNOSIS — K649 Unspecified hemorrhoids: Secondary | ICD-10-CM

## 2021-06-06 DIAGNOSIS — E1169 Type 2 diabetes mellitus with other specified complication: Secondary | ICD-10-CM | POA: Diagnosis not present

## 2021-06-06 DIAGNOSIS — E785 Hyperlipidemia, unspecified: Secondary | ICD-10-CM

## 2021-06-06 DIAGNOSIS — H9203 Otalgia, bilateral: Secondary | ICD-10-CM

## 2021-06-06 HISTORY — DX: Otalgia, bilateral: H92.03

## 2021-06-06 MED ORDER — HYDROCORTISONE (PERIANAL) 2.5 % EX CREA: 1.0000 "application " | TOPICAL_CREAM | Freq: Two times a day (BID) | CUTANEOUS | 0 refills | Status: DC

## 2021-06-06 MED ORDER — TRIAMCINOLONE ACETONIDE 55 MCG/ACT NA AERO: 2.0000 | INHALATION_SPRAY | Freq: Every day | NASAL | 12 refills | Status: DC

## 2021-06-06 MED ORDER — AZELASTINE HCL 0.15 % NA SOLN: 1.0000 | Freq: Two times a day (BID) | NASAL | 3 refills | Status: DC

## 2021-06-06 NOTE — Assessment & Plan Note (Signed)
Pt with chronic hearing loss and trouble with ears Will refer back to his ent  Change nasonex to nasacort due to ins changes and change astelin to astepro

## 2021-06-06 NOTE — Progress Notes (Addendum)
Subjective:   By signing my name below, I, Shehryar Baig, attest that this documentation has been prepared under the direction and in the presence of Dr. Roma Schanz, DO. 06/06/2021    Patient ID: Chase [name], male    DOB: 10/26/1960, 60 y.o.   MRN: [id number]  Chief Complaint  Patient presents with   Sinusitis   Follow-up    HPI Patient is in today for a office visit.  He continues having congestion, difficulty hearing, ear pain, crackling sound in ears, and headaches. He is taking cough syrup and nasal spray to manage his symptoms. He is requesting a refill on his nasal spray. He reports his insurance is not paying for his cough syrup and nose sprays anymore. He is requesting for alternative medication his insurance can pay for. He continues wearing hearing aides at this time.   Past Medical History:  Diagnosis Date   Abnormality of gait 11/10/2012   Acute GI bleeding    Acute renal insufficiency 02/10/2015   Acute upper respiratory infection 08/10/2012   Allergic drug reaction 05/24/2020   Allergic rhinitis 10/16/2020   Allergy    Anemia 02/10/2015   Annual physical exam 11/26/2011   ANXIETY DEPRESSION 12/09/2007   Qualifier: Diagnosis of  By: Cori Razor RN, Mikal Plane    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 10/17/2014   Arthralgia 07/25/2010   Arthritis    Benign neoplasm of brain ([place]) 05/24/2019   Bigeminy ? 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 ([place]) 03/12/2018   Cerebrovascular accident, old 11/16/2014   Chest discomfort 06/08/2019   Chronic back pain 11/28/2020   Chronic cough 08/27/2014   CXR 08/2014:  Mild hyperinflation, no acute process Arlyce Harman 09/2014:  Difficulty with procedure, but  essentially normal +CVA's in past, with aspiration risk by history Speech evaluation 2016:      Chronic left shoulder pain 08/06/2020   Chronic pain syndrome 11/23/2019   Chronic pansinusitis 06/30/2018   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   Constipation    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   DDD (degenerative disc disease), cervical 04/26/2019   DDD (degenerative disc disease), lumbar 04/26/2019   Decreased hearing of both ears 02/12/2013   Deviated septum 05/31/2018   Diabetes mellitus without complication ([place])    Difficulty urinating    DM (diabetes mellitus) type II uncontrolled, periph vascular disorder 01/02/2016   Dry skin 05/09/2019   Dysuria 11/29/2010   Essential (primary) hypertension 01/26/2015   Excessive daytime sleepiness 02/11/2018   FATIGUE 12/09/2007   Qualifier: Diagnosis of  By: Aurther Loft    Fibromyalgia 03/02/2017   Food intolerance 05/09/2019   GERD 12/09/2007   Qualifier: Diagnosis of  By: Niel Hummer MD, Lorinda Creed    GERD (gastroesophageal reflux disease)    GI bleed 02/10/2015   Headache(784.0)    Hearing loss    Hemangioma 02/12/2013   Hemorrhoid 05/22/2011   HTN (hypertension) 02/04/2010   Qualifier: Diagnosis of  By: Charlett Blake MD, Stacey     Hyperlipidemia    Hyperlipidemia associated with type 2 diabetes mellitus ([place]) 11/22/2020   Hyperlipidemia LDL goal <70 01/26/2015   Hypersomnia, persistent 11/05/2012  epworth of 18 points, was placed on CPAP in 2008 with very low AHI ( no number  Mentioned) and struggles ever since with PAP. 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   Leg swelling    Localized swelling  of both lower legs 03/02/2017   Low back pain 07/20/2007   Qualifier: Diagnosis of  By: Niel Hummer MD, Lorinda Creed    Lower extremity edema 07/04/2020   Lower GI bleed    Mixed conductive and sensorineural hearing loss of right ear with restricted hearing of left ear 10/16/2020   Nasal congestion    Nasal turbinate hypertrophy 05/31/2018   Neck pain 03/15/2020   NEPHROLITHIASIS 08/13/2006   Qualifier: Diagnosis of  By: Beryle Lathe     Nocturia more than twice per night 02/11/2018   OSA on CPAP 10/17/2014   Other chronic pain 03/02/2017   Other fatigue 07/04/2020   Pain in finger of right hand 10/06/2012   Pain in joint of right shoulder 02/21/2017   Palpitations 01/26/2015   Paresthesia 07/25/2010   Persistent atrial fibrillation ([place]) 08/29/2020   Persistent headaches 10/17/2014   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   Qualifier: Diagnosis of  By: Niel Hummer MD, Izell [place] R    PONV (postoperative nausea and vomiting)    Preoperative clearance 07/04/2020   Prolapsed hemorrhoids 10/05/2013   Radiculopathy, lumbar region 03/15/2020   Rash 05/24/2019   Raynaud's disease 03/02/2017   Raynaud's phenomenon 03/02/2017   Rectal bleeding 02/12/2015   Rectal pain    Rectal ulcer with bleeding after hemorrhoid banding 02/11/2015   Rhinitis, chronic 12/23/2006   Qualifier: Diagnosis of  By: Cori Razor RN, Regina G    Right shoulder pain 02/15/2015   Seasonal and perennial allergic rhinitis 05/09/2019   Severe episode of recurrent major depressive disorder, without psychotic features ([place]) 08/29/2020   Sinus infection 06/30/2014   Sinusitis, acute, maxillary 02/02/2014   Sleep apnea    Sleep apnea with use of continuous positive airway pressure (CPAP) 02/11/2018   SOB (shortness of breath) 07/04/2020   Stroke ([place])    Stroke ([place])    Subacute confusional state 10/17/2014   SYMPTOM, HYPERSOMNIA NOS 02/19/2007   Qualifier: Diagnosis of  By: Tilden Dome     Thrombosed hemorrhoids    Tick bite  of back 11/23/2019   Tinnitus of both ears 08/29/2020   Trouble swallowing    Unspecified hemorrhoids 02/10/2015   Unspecified sleep apnea 11/05/2012   Varicose veins of lower extremities with other complications 24/46/2863   Vertigo 03/19/2012    Past Surgical History:  Procedure Laterality Date   CATARACT EXTRACTION     x 3   EYE SURGERY  1968, 1985, 1987   cataracts   FLEXIBLE SIGMOIDOSCOPY N/A 02/11/2015   Procedure: FLEXIBLE SIGMOIDOSCOPY;  Surgeon: Gatha Mayer, MD;  Location: [facility];  Service: Endoscopy;  Laterality: N/A;   INNER EAR SURGERY     rt ear   INNER EAR SURGERY  1992   surgery - right arm     1983    Family History  Problem Relation Age of Onset   Breast cancer Mother 56       breast   Huntington's disease Mother  Stroke Father    Heart disease Father    Hypertension Father    Heart attack Father    Hypertension Brother    Hyperlipidemia Neg Hx    Diabetes Neg Hx     Social History   Socioeconomic History   Marital status: Single    Spouse name: Not on file   Number of children: 0   Years of education: Not on file   Highest education level: Not on file  Occupational History   Not on file  Tobacco Use   Smoking status: Never   Smokeless tobacco: Never  Vaping Use   Vaping Use: Never used  Substance and Sexual Activity   Alcohol use: No    Alcohol/week: 0.0 standard drinks    Comment: none   Drug use: No   Sexual activity: Never  Other Topics Concern   Not on file  Social History Narrative   No caffeine intake except for chocolate.  Exercised-walking   Social Determinants of Radio broadcast assistant Strain: Not on file  Food Insecurity: Not on file  Transportation Needs: Not on file  Physical Activity: Not on file  Stress: Not on file  Social Connections: Not on file  Intimate Partner Violence: Not on file    Outpatient Medications Prior to Visit  Medication Sig Dispense Refill   allopurinol (ZYLOPRIM) 100 MG  tablet Take 1 tablet (100 mg total) by mouth 2 (two) times daily. 180 tablet 1   amLODipine (NORVASC) 2.5 MG tablet Take 2.5 mg by mouth daily.     benzonatate (TESSALON) 200 MG capsule Take 1 capsule (200 mg total) by mouth 2 (two) times daily as needed for cough. 20 capsule 0   clotrimazole-betamethasone (LOTRISONE) cream Apply 1 application topically as needed (rash).     empagliflozin (JARDIANCE) 25 MG TABS tablet Take 1 tablet (25 mg total) by mouth daily. 90 tablet 3   EPINEPHrine 0.3 mg/0.3 mL IJ SOAJ injection Inject 0.3 mg into the muscle as needed for anaphylaxis.     famotidine (PEPCID) 40 MG/5ML suspension Take 2.5 mLs by mouth daily.     FIBER SELECT GUMMIES PO Take 1 tablet by mouth daily.     finasteride (PROSCAR) 5 MG tablet TAKE 1 TABLET BY MOUTH ONCE DAILY 90 tablet 3   fluticasone (CUTIVATE) 0.05 % cream Apply 1 application topically 2 (two) times daily.     gabapentin (NEURONTIN) 100 MG capsule TAKE 1 CAPSULE BY MOUTH THREE TIMES A DAY 90 capsule 3   glucose blood test strip Use as instructed to check blood sugar 1 daily. 100 each 3   hydrocortisone-pramoxine (PROCTOFOAM HC) rectal foam APPLY FOAM THREE TIMES A DAY AS NEEDED FOR 7 DAYS 10 g 2   hydrOXYzine (ATARAX) 25 MG tablet TAKE 1 TABLET BY MOUTH EVERY 8 HOURS AS NEEDED FOR ANXIETY OR ITCHING 270 tablet 1   Insulin Pen Needle 32G X 4 MM MISC Use 1x a day 100 each 3   losartan (COZAAR) 100 MG tablet Take 1 tablet (100 mg total) by mouth daily. 90 tablet 3   metFORMIN (GLUCOPHAGE) 500 MG tablet Take 1 tablet (500 mg total) by mouth 2 (two) times daily with a meal. 180 tablet 3   metoprolol tartrate (LOPRESSOR) 50 MG tablet TAKE 1 TABLET BY MOUTH EVERY DAY 90 tablet 1   Multiple Vitamins-Minerals (AIRBORNE GUMMIES PO) Take 1 each by mouth daily.     polyethylene glycol powder (GLYCOLAX/MIRALAX) 17 GM/SCOOP powder Take 17 g by  mouth daily as needed for constipation.     pravastatin (PRAVACHOL) 20 MG tablet TAKE 2 TABLETS (40  MG TOTAL) BY MOUTH DAILY. 180 tablet 1   prednisoLONE (ORAPRED) 15 MG/5ML solution Take 15 mLs (45 mg total) by mouth daily before breakfast. 100 mL 0   terazosin (HYTRIN) 5 MG capsule Take 5 mg by mouth daily.     venlafaxine XR (EFFEXOR-XR) 75 MG 24 hr capsule TAKE 1 CAPSULE BY MOUTH DAILY WITH BREAKFAST. 90 capsule 1   VICTOZA 18 MG/3ML SOPN INJECT 1.8 MG UNDER THE SKIN ONCE A DAY BEFORE BREAKFAST. 9 mL 3   azelastine (ASTELIN) 0.1 % nasal spray Place 2 sprays into both nostrils 2 (two) times daily.     Azelastine HCl 0.15 % SOLN TWO SPRAYS EACH NOSTRIL 1-2 TIMES A DAY AS NEEDED. 11 mL 5   cyclobenzaprine (FLEXERIL) 10 MG tablet TAKE 1 TABLET BY MOUTH TWICE A DAY AS NEEDED FOR MUSCLE SPASM 30 tablet 1   HYDROcodone-acetaminophen (HYCET) 7.5-325 mg/15 ml solution Take 15 mLs by mouth 4 (four) times daily as needed for pain.     mometasone (NASONEX) 50 MCG/ACT nasal spray Place 2 sprays into the nose daily. 17 g 12   No facility-administered medications prior to visit.    Allergies  Allergen Reactions   Amoxicillin Hives, Itching and Other (See Comments)    White tongue; ? hives   Sulfa Antibiotics Hives and Rash   Terfenadine     ? reaction    Review of Systems  Constitutional:  Negative for fever and malaise/fatigue.  HENT:  Negative for congestion.        (+)difficulty hearing  Eyes:  Negative for blurred vision.  Respiratory:  Negative for shortness of breath.   Cardiovascular:  Negative for chest pain, palpitations and leg swelling.  Gastrointestinal:  Negative for abdominal pain, blood in stool and nausea.  Genitourinary:  Negative for dysuria and frequency.  Musculoskeletal:  Negative for falls.  Skin:  Negative for rash.  Neurological:  Negative for dizziness, loss of consciousness and headaches.  Endo/Heme/Allergies:  Negative for environmental allergies.  Psychiatric/Behavioral:  Negative for depression. The patient is not nervous/anxious.       Objective:     Physical Exam Vitals and nursing note reviewed.  Constitutional:      General: He is not in acute distress.    Appearance: Normal appearance. He is not ill-appearing.  HENT:     Head: Normocephalic and atraumatic.     Right Ear: Ear canal and external ear normal. Tympanic membrane is scarred.     Left Ear: Ear canal and external ear normal. Tympanic membrane is scarred.  Eyes:     Extraocular Movements: Extraocular movements intact.     Pupils: Pupils are equal, round, and reactive to light.  Cardiovascular:     Rate and Rhythm: Normal rate and regular rhythm.     Heart sounds: Normal heart sounds. No murmur heard.   No gallop.  Pulmonary:     Effort: Pulmonary effort is normal. No respiratory distress.     Breath sounds: Normal breath sounds. No wheezing or rales.  Skin:    General: Skin is warm and dry.  Neurological:     Mental Status: He is alert and oriented to person, place, and time.  Psychiatric:        Behavior: Behavior normal.    BP 124/80 (BP Location: Left Arm, Patient Position: Sitting, Cuff Size: Normal)    Pulse 92  Temp 98.4 F (36.9 C) (Oral)    Resp 20    Ht 5\' 10"  (1.778 m)    Wt 169 lb 6.4 oz (76.8 kg)    SpO2 97%    BMI 24.31 kg/m  Wt Readings from Last 3 Encounters:  06/06/21 169 lb 6.4 oz (76.8 kg)  06/03/21 167 lb 12.8 oz (76.1 kg)  05/29/21 171 lb 4 oz (77.7 kg)    Diabetic Foot Exam - Simple   No data filed    Lab Results  Component Value Date   WBC 9.8 06/06/2021   HGB 12.6 (L) 06/06/2021   HCT 37.9 (L) 06/06/2021   PLT 348.0 06/06/2021   GLUCOSE 130 (H) 06/06/2021   CHOL 135 06/06/2021   TRIG 169.0 (H) 06/06/2021   HDL 35.40 (L) 06/06/2021   LDLDIRECT 56.0 08/21/2020   LDLCALC 66 06/06/2021   ALT 186 (H) 06/06/2021   AST 85 (H) 06/06/2021   NA 142 06/06/2021   K 3.5 06/06/2021   CL 100 06/06/2021   CREATININE 0.86 06/06/2021   BUN 7 06/06/2021   CO2 33 (H) 06/06/2021   TSH 4.40 07/03/2020   PSA 1.01 10/03/2014   INR 1.11  02/10/2015   HGBA1C 6.7 (A) 06/03/2021   MICROALBUR <0.7 05/24/2020    Lab Results  Component Value Date   TSH 4.40 07/03/2020   Lab Results  Component Value Date   WBC 9.8 06/06/2021   HGB 12.6 (L) 06/06/2021   HCT 37.9 (L) 06/06/2021   MCV 87.9 06/06/2021   PLT 348.0 06/06/2021   Lab Results  Component Value Date   NA 142 06/06/2021   K 3.5 06/06/2021   CO2 33 (H) 06/06/2021   GLUCOSE 130 (H) 06/06/2021   BUN 7 06/06/2021   CREATININE 0.86 06/06/2021   BILITOT 0.7 06/06/2021   ALKPHOS 73 06/06/2021   AST 85 (H) 06/06/2021   ALT 186 (H) 06/06/2021   PROT 6.5 06/06/2021   ALBUMIN 4.3 06/06/2021   CALCIUM 9.7 06/06/2021   ANIONGAP 9 02/12/2015   GFR 93.93 06/06/2021   Lab Results  Component Value Date   CHOL 135 06/06/2021   Lab Results  Component Value Date   HDL 35.40 (L) 06/06/2021   Lab Results  Component Value Date   LDLCALC 66 06/06/2021   Lab Results  Component Value Date   TRIG 169.0 (H) 06/06/2021   Lab Results  Component Value Date   CHOLHDL 4 06/06/2021   Lab Results  Component Value Date   HGBA1C 6.7 (A) 06/03/2021       Assessment & Plan:   Problem List Items Addressed This Visit       Unprioritized   Hemorrhoid   Relevant Medications   hydrocortisone (ANUSOL-HC) 2.5 % rectal cream   Sinus infection - Primary   Relevant Medications   triamcinolone (NASACORT) 55 MCG/ACT AERO nasal inhaler   Azelastine HCl (ASTEPRO) 0.15 % SOLN   Hyperlipidemia associated with type 2 diabetes mellitus ([place])    Encourage heart healthy diet such as MIND or DASH diet, increase exercise, avoid trans fats, simple carbohydrates and processed foods, consider a krill or fish or flaxseed oil cap daily.       Relevant Orders   Comprehensive metabolic panel (Completed)   Lipid panel (Completed)   CBC with Differential/Platelet (Completed)   Otalgia of both ears    Pt with chronic hearing loss and trouble with ears Will refer back to his ent  Change  nasonex to nasacort due  to ins changes and change astelin to astepro         Relevant Orders   Ambulatory referral to ENT   Face to face time 40 min ---  greater 50% discussing his sinus issues and ear pain with hearing loss and refilling meds needed   Meds ordered this encounter  Medications   triamcinolone (NASACORT) 55 MCG/ACT AERO nasal inhaler    Sig: Place 2 sprays into the nose daily.    Dispense:  1 each    Refill:  12   hydrocortisone (ANUSOL-HC) 2.5 % rectal cream    Sig: Place 1 application rectally 2 (two) times daily.    Dispense:  30 g    Refill:  0   Azelastine HCl (ASTEPRO) 0.15 % SOLN    Sig: Place 1 spray into the nose in the morning and at bedtime.    Dispense:  1 mL    Refill:  3    I, Dr. Roma Schanz, DO, personally preformed the services described in this documentation.  All medical record entries made by the scribe were at my direction and in my presence.  I have reviewed the chart and discharge instructions (if applicable) and agree that the record reflects my personal performance and is accurate and complete. 06/06/2021   I,Shehryar Baig,acting as a scribe for Ann Held, DO.,have documented all relevant documentation on the behalf of Ann Held, DO,as directed by  Ann Held, DO while in the presence of Ann Held, DO.   Ann Held, DO

## 2021-06-06 NOTE — Assessment & Plan Note (Signed)
Encourage heart healthy diet such as MIND or DASH diet, increase exercise, avoid trans fats, simple carbohydrates and processed foods, consider a krill or fish or flaxseed oil cap daily.  °

## 2021-06-06 NOTE — Patient Instructions (Signed)
Earache, Adult An earache, or ear pain, can be caused by many things, including: An infection. Ear wax buildup. Ear pressure. Something in the ear that should not be there (foreign body). A sore throat. Tooth problems. Jaw problems. Treatment of the earache will depend on the cause. If the cause is not clear or cannot be determined, you may need to watch your symptoms until your earache goes away or until a cause is found. Follow these instructions at home: Medicines Take or apply over-the-counter and prescription medicines only as told by your health care provider. If you were prescribed an antibiotic medicine, use it as told by your health care provider. Do not stop using the antibiotic even if you start to feel better. Do not put anything in your ear other than medicine that is prescribed by your health care provider. Managing pain If directed, apply heat to the affected area as often as told by your health care provider. Use the heat source that your health care provider recommends, such as a moist heat pack or a heating pad. Place a towel between your skin and the heat source. Leave the heat on for 20-30 minutes. Remove the heat if your skin turns bright red. This is especially important if you are unable to feel pain, heat, or cold. You may have a greater risk of getting burned. If directed, put ice on the affected area as often as told by your health care provider. To do this:   Put ice in a plastic bag. Place a towel between your skin and the bag. Leave the ice on for 20 minutes, 2-3 times a day. General instructions Pay attention to any changes in your symptoms. Try resting in an upright position instead of lying down. This may help to reduce pressure in your ear and relieve pain. Chew gum if it helps to relieve your ear pain. Treat any allergies as told by your health care provider. Drink enough fluid to keep your urine pale yellow. It is up to you to get the results of any  tests that were done. Ask your health care provider, or the department that is doing the tests, when your results will be ready. Keep all follow-up visits as told by your health care provider. This is important. Contact a health care provider if: Your pain does not improve within 2 days. Your earache gets worse. You have new symptoms. You have a fever. Get help right away if you: Have a severe headache. Have a stiff neck. Have trouble swallowing. Have redness or swelling behind your ear. Have fluid or blood coming from your ear. Have hearing loss. Feel dizzy. Summary An earache, or ear pain, can be caused by many things. Treatment of the earache will depend on the cause. Follow recommendations from your health care provider to treat your ear pain. If the cause is not clear or cannot be determined, you may need to watch your symptoms until your earache goes away or until a cause is found. Keep all follow-up visits as told by your health care provider. This is important. This information is not intended to replace advice given to you by your health care provider. Make sure you discuss any questions you have with your health care provider. Document Revised: 01/08/2019 Document Reviewed: 01/08/2019 Elsevier Patient Education  2022 [employer]  

## 2021-06-07 LAB — CBC WITH DIFFERENTIAL/PLATELET
Basophils Absolute: 0.1 10*3/uL (ref 0.0–0.1)
Basophils Relative: 0.6 % (ref 0.0–3.0)
Eosinophils Absolute: 0.2 10*3/uL (ref 0.0–0.7)
Eosinophils Relative: 1.9 % (ref 0.0–5.0)
HCT: 37.9 % — ABNORMAL LOW (ref 39.0–52.0)
Hemoglobin: 12.6 g/dL — ABNORMAL LOW (ref 13.0–17.0)
Lymphocytes Relative: 30.9 % (ref 12.0–46.0)
Lymphs Abs: 3 10*3/uL (ref 0.7–4.0)
MCHC: 33.3 g/dL (ref 30.0–36.0)
MCV: 87.9 fl (ref 78.0–100.0)
Monocytes Absolute: 0.7 10*3/uL (ref 0.1–1.0)
Monocytes Relative: 7.6 % (ref 3.0–12.0)
Neutro Abs: 5.8 10*3/uL (ref 1.4–7.7)
Neutrophils Relative %: 59 % (ref 43.0–77.0)
Platelets: 348 10*3/uL (ref 150.0–400.0)
RBC: 4.31 Mil/uL (ref 4.22–5.81)
RDW: 13.6 % (ref 11.5–15.5)
WBC: 9.8 10*3/uL (ref 4.0–10.5)

## 2021-06-07 LAB — COMPREHENSIVE METABOLIC PANEL
ALT: 186 U/L — ABNORMAL HIGH (ref 0–53)
AST: 85 U/L — ABNORMAL HIGH (ref 0–37)
Albumin: 4.3 g/dL (ref 3.5–5.2)
Alkaline Phosphatase: 73 U/L (ref 39–117)
BUN: 7 mg/dL (ref 6–23)
CO2: 33 mEq/L — ABNORMAL HIGH (ref 19–32)
Calcium: 9.7 mg/dL (ref 8.4–10.5)
Chloride: 100 mEq/L (ref 96–112)
Creatinine, Ser: 0.86 mg/dL (ref 0.40–1.50)
GFR: 93.93 mL/min (ref >60.00)
Glucose, Bld: 130 mg/dL — ABNORMAL HIGH (ref 70–99)
Potassium: 3.5 mEq/L (ref 3.5–5.1)
Sodium: 142 mEq/L (ref 135–145)
Total Bilirubin: 0.7 mg/dL (ref 0.2–1.2)
Total Protein: 6.5 g/dL (ref 6.0–8.3)

## 2021-06-07 LAB — LIPID PANEL
Cholesterol: 135 mg/dL (ref 0–200)
HDL: 35.4 mg/dL — ABNORMAL LOW (ref >39.00)
LDL Cholesterol: 66 mg/dL (ref 0–99)
NonHDL: 99.4
Total CHOL/HDL Ratio: 4
Triglycerides: 169 mg/dL — ABNORMAL HIGH (ref 0.0–149.0)
VLDL: 33.8 mg/dL (ref 0.0–40.0)

## 2021-06-12 DIAGNOSIS — D497 Neoplasm of unspecified behavior of endocrine glands and other parts of nervous system: Secondary | ICD-10-CM | POA: Diagnosis not present

## 2021-06-12 DIAGNOSIS — I10 Essential (primary) hypertension: Secondary | ICD-10-CM | POA: Diagnosis not present

## 2021-06-15 ENCOUNTER — Other Ambulatory Visit: Payer: Self-pay | Admitting: Family Medicine

## 2021-06-18 ENCOUNTER — Other Ambulatory Visit: Payer: Self-pay | Admitting: Family Medicine

## 2021-06-18 DIAGNOSIS — R748 Abnormal levels of other serum enzymes: Secondary | ICD-10-CM

## 2021-06-19 NOTE — Addendum Note (Signed)
Addended by: Roma Schanz R on: 06/19/2021 12:37 PM   Modules accepted: Level of Service

## 2021-06-21 ENCOUNTER — Other Ambulatory Visit: Payer: Self-pay | Admitting: Family Medicine

## 2021-06-21 ENCOUNTER — Other Ambulatory Visit: Payer: Self-pay

## 2021-06-24 ENCOUNTER — Encounter (INDEPENDENT_AMBULATORY_CARE_PROVIDER_SITE_OTHER): Payer: Self-pay

## 2021-06-24 ENCOUNTER — Encounter: Payer: Self-pay | Admitting: Family Medicine

## 2021-06-28 ENCOUNTER — Other Ambulatory Visit (INDEPENDENT_AMBULATORY_CARE_PROVIDER_SITE_OTHER): Payer: BC Managed Care – PPO

## 2021-06-28 ENCOUNTER — Encounter: Payer: Self-pay | Admitting: Cardiology

## 2021-06-28 ENCOUNTER — Other Ambulatory Visit: Payer: Self-pay

## 2021-06-28 ENCOUNTER — Ambulatory Visit (INDEPENDENT_AMBULATORY_CARE_PROVIDER_SITE_OTHER): Payer: BC Managed Care – PPO | Admitting: Cardiology

## 2021-06-28 ENCOUNTER — Other Ambulatory Visit: Payer: Self-pay | Admitting: Internal Medicine

## 2021-06-28 VITALS — BP 120/66 | HR 90 | Ht 70.0 in | Wt 169.1 lb

## 2021-06-28 DIAGNOSIS — E119 Type 2 diabetes mellitus without complications: Secondary | ICD-10-CM | POA: Diagnosis not present

## 2021-06-28 DIAGNOSIS — E782 Mixed hyperlipidemia: Secondary | ICD-10-CM | POA: Diagnosis not present

## 2021-06-28 DIAGNOSIS — R748 Abnormal levels of other serum enzymes: Secondary | ICD-10-CM | POA: Diagnosis not present

## 2021-06-28 DIAGNOSIS — J300 Vasomotor rhinitis: Secondary | ICD-10-CM

## 2021-06-28 DIAGNOSIS — G4730 Sleep apnea, unspecified: Secondary | ICD-10-CM | POA: Diagnosis not present

## 2021-06-28 DIAGNOSIS — I10 Essential (primary) hypertension: Secondary | ICD-10-CM

## 2021-06-28 HISTORY — DX: Vasomotor rhinitis: J30.0

## 2021-06-28 NOTE — Addendum Note (Signed)
Addended by: Manuela Schwartz on: 06/28/2021 03:09 PM   Modules accepted: Orders

## 2021-06-28 NOTE — Patient Instructions (Signed)

## 2021-06-28 NOTE — Progress Notes (Signed)
Cardiology Office Note:    Date:  06/28/2021   ID:  Chase [name], DOB 1961/01/24, MRN [id number]  PCP:  Ann Held, DO  Cardiologist:  Jenean Lindau, MD   Referring MD: Carollee Herter, Alferd Apa, *    ASSESSMENT:    1. Sleep apnea, unspecified type   2. Primary hypertension   3. Mixed hyperlipidemia   4. Diabetes mellitus without complication (HCC)    PLAN:    In order of problems listed above:  Primary prevention stressed with the patient.  Importance of compliance with diet medication stressed any vocalized understanding Essential hypertension: Blood pressure stable and diet was emphasized lifestyle modification urged. Mixed dyslipidemia: Lipids were reviewed and followed by primary care.  I reassured him about my findings. Sleep apnea: Sleep issues were discussed. Patient was advised to walk half an hour a day 5 days a week and he promises to do so   Medication Adjustments/Labs and Tests Ordered: Current medicines are reviewed at length with the patient today.  Concerns regarding medicines are outlined above.  No orders of the defined types were placed in this encounter.  No orders of the defined types were placed in this encounter.    No chief complaint on file.    History of Present Illness:    Chase [name] is a 61 y.o. male.  Patient has past medical history of essential hypertension, dyslipidemia, diabetes mellitus and sleep apnea.  He denies any problems at this time from a cardiovascular standpoint.  No chest pain orthopnea or PND.  He walks on a regular basis but tells me in the past several weeks he has been lax with this.  At the time of my evaluation, the patient is alert awake oriented and in no distress.  Past Medical History:  Diagnosis Date   Abnormality of gait 11/10/2012   Acute GI bleeding    Acute renal insufficiency 02/10/2015   Acute upper respiratory infection 08/10/2012   Allergic drug reaction 05/24/2020   Allergic rhinitis  10/16/2020   Allergy    Anemia 02/10/2015   Annual physical exam 11/26/2011   ANXIETY DEPRESSION 12/09/2007   Qualifier: Diagnosis of  By: Cori Razor RN, Mikal Plane    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 10/17/2014   Arthralgia 07/25/2010   Arthritis    Benign neoplasm of brain ([place]) 05/24/2019   Bigeminy ? 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 ([place]) 03/12/2018   Cerebrovascular accident, old 11/16/2014   Chest discomfort 06/08/2019   Chronic back pain 11/28/2020   Chronic cough 08/27/2014   CXR 08/2014:  Mild hyperinflation, no acute process Arlyce Harman 09/2014:  Difficulty with procedure, but essentially normal +CVA's in past, with aspiration risk by history Speech evaluation 2016:      Chronic left shoulder pain 08/06/2020   Chronic pain syndrome 11/23/2019   Chronic pansinusitis 06/30/2018   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   Constipation    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   DDD (degenerative disc disease), cervical 04/26/2019   DDD (degenerative disc disease), lumbar 04/26/2019   Decreased hearing of both ears 02/12/2013   Deviated septum 05/31/2018   Diabetes mellitus without  complication ([place])    Difficulty urinating    DM (diabetes mellitus) type II uncontrolled, periph vascular disorder 01/02/2016   Dry skin 05/09/2019   Dysuria 11/29/2010   Essential (primary) hypertension 01/26/2015   Excessive daytime sleepiness 02/11/2018   FATIGUE 12/09/2007   Qualifier: Diagnosis of  By: Cori Razor RN, Mikal Plane    Fibromyalgia 03/02/2017   Food intolerance 05/09/2019   GERD 12/09/2007   Qualifier: Diagnosis of  By: Niel Hummer MD, Lorinda Creed    GERD (gastroesophageal reflux disease)     GI bleed 02/10/2015   Headache(784.0)    Hearing loss    Hemangioma 02/12/2013   Hemorrhoid 05/22/2011   HTN (hypertension) 02/04/2010   Qualifier: Diagnosis of  By: Charlett Blake MD, Stacey     Hyperlipidemia    Hyperlipidemia associated with type 2 diabetes mellitus ([place]) 11/22/2020   Hyperlipidemia LDL goal <70 01/26/2015   Hypersomnia, persistent 11/05/2012   epworth of 18 points, was placed on CPAP in 2008 with very low AHI ( no number  Mentioned) and struggles ever since with PAP. 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   Leg swelling    Localized swelling of both lower legs 03/02/2017   Low back pain 07/20/2007   Qualifier: Diagnosis of  By: Niel Hummer MD, Lorinda Creed    Lower extremity edema 07/04/2020   Lower GI bleed    Mixed conductive and sensorineural hearing loss of right ear with restricted hearing of left ear 10/16/2020   Nasal congestion    Neck pain 03/15/2020   NEPHROLITHIASIS 08/13/2006   Qualifier: Diagnosis of  By: Beryle Lathe     Nocturia more than twice per night 02/11/2018   OSA on CPAP 10/17/2014   Otalgia of both ears 06/06/2021   Other chronic pain 03/02/2017   Other fatigue 07/04/2020   Pain in finger of right hand 10/06/2012   Pain in joint of right shoulder 02/21/2017   Palpitations 01/26/2015   Paresthesia 07/25/2010   Persistent atrial fibrillation ([place]) 08/29/2020   Persistent headaches 10/17/2014   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   Qualifier: Diagnosis of  By: Niel Hummer MD, Izell Stone R    PONV (postoperative nausea and vomiting)    Preoperative clearance 07/04/2020   Prolapsed hemorrhoids 10/05/2013   Radiculopathy, lumbar region 03/15/2020    Rash 05/24/2019   Raynaud's disease 03/02/2017   Raynaud's phenomenon 03/02/2017   Rectal bleeding 02/12/2015   Rectal pain    Rectal ulcer with bleeding after hemorrhoid banding 02/11/2015   Rhinitis, chronic 12/23/2006   Qualifier: Diagnosis of  By: Cori Razor RN, Regina G    Right shoulder pain 02/15/2015   Seasonal and perennial allergic rhinitis 05/09/2019   Severe episode of recurrent major depressive disorder, without psychotic features ([place]) 08/29/2020   Sinus infection 06/30/2014   Sinusitis, acute, maxillary 02/02/2014   Sleep apnea    Sleep apnea with use of continuous positive airway pressure (CPAP) 02/11/2018   SOB (shortness of breath) 07/04/2020   Stroke ([place])    Stroke ([place])    Subacute confusional state 10/17/2014   SYMPTOM, HYPERSOMNIA NOS 02/19/2007   Qualifier: Diagnosis of  By: Tilden Dome     Thrombosed hemorrhoids    Tick bite  of back 11/23/2019   Tinnitus of both ears 08/29/2020   Trouble swallowing    Unspecified hemorrhoids 02/10/2015   Unspecified sleep apnea 11/05/2012   Varicose veins of lower extremities with other complications 25/42/7062   Vertigo 03/19/2012    Past Surgical History:  Procedure Laterality Date   CATARACT EXTRACTION     x 3   EYE SURGERY  1968, 1985, 1987   cataracts   FLEXIBLE SIGMOIDOSCOPY N/A 02/11/2015   Procedure: FLEXIBLE SIGMOIDOSCOPY;  Surgeon: Gatha Mayer, MD;  Location: [facility];  Service: Endoscopy;  Laterality: N/A;   INNER EAR SURGERY     rt ear   INNER EAR SURGERY  1992   surgery - right arm     1983    Current Medications: Current Meds  Medication Sig   allopurinol (ZYLOPRIM) 100 MG tablet TAKE 1 TABLET BY MOUTH TWICE A DAY   amLODipine (NORVASC) 2.5 MG tablet Take 2.5 mg by mouth daily.   Azelastine HCl (ASTEPRO) 0.15 % SOLN Place 1 spray into the nose in the morning and at bedtime.   BD PEN NEEDLE NANO 2ND GEN 32G X 4 MM MISC USE ONCE DAILY AS DIRECTED   clotrimazole-betamethasone (LOTRISONE)  cream Apply 1 application topically as needed for rash.   cyclobenzaprine (FLEXERIL) 10 MG tablet TAKE 1 TABLET BY MOUTH TWICE A DAY AS NEEDED FOR MUSCLE SPASMS   empagliflozin (JARDIANCE) 25 MG TABS tablet Take 1 tablet (25 mg total) by mouth daily.   EPINEPHrine 0.3 mg/0.3 mL IJ SOAJ injection Inject 0.3 mg into the muscle as needed for anaphylaxis.   famotidine (PEPCID) 40 MG/5ML suspension Take 2.5 mLs by mouth daily.   FIBER SELECT GUMMIES PO Take 1 tablet by mouth daily.   finasteride (PROSCAR) 5 MG tablet TAKE 1 TABLET BY MOUTH ONCE DAILY   fluticasone (CUTIVATE) 0.05 % cream Apply 1 application topically 2 (two) times daily.   gabapentin (NEURONTIN) 100 MG capsule TAKE 1 CAPSULE BY MOUTH THREE TIMES A DAY   glucose blood test strip Use as instructed to check blood sugar 1 daily.   hydrocortisone (ANUSOL-HC) 2.5 % rectal cream Place 1 application rectally 2 (two) times daily.   hydrOXYzine (ATARAX) 25 MG tablet TAKE 1 TABLET BY MOUTH EVERY 8 HOURS AS NEEDED FOR ANXIETY OR ITCHING   losartan (COZAAR) 100 MG tablet Take 1 tablet (100 mg total) by mouth daily.   metFORMIN (GLUCOPHAGE) 500 MG tablet Take 1 tablet (500 mg total) by mouth 2 (two) times daily with a meal.   metoprolol tartrate (LOPRESSOR) 50 MG tablet TAKE 1 TABLET BY MOUTH EVERY DAY   polyethylene glycol powder (GLYCOLAX/MIRALAX) 17 GM/SCOOP powder Take 17 g by mouth daily as needed for constipation.   pravastatin (PRAVACHOL) 20 MG tablet TAKE 2 TABLETS (40 MG TOTAL) BY MOUTH DAILY.   terazosin (HYTRIN) 5 MG capsule Take 5 mg by mouth daily.   triamcinolone (NASACORT) 55 MCG/ACT AERO nasal inhaler Place 2 sprays into the nose daily.   venlafaxine XR (EFFEXOR-XR) 75 MG 24 hr capsule TAKE 1 CAPSULE BY MOUTH DAILY WITH BREAKFAST.   VICTOZA 18 MG/3ML SOPN INJECT 1.8 MG UNDER THE SKIN ONCE A DAY BEFORE BREAKFAST.     Allergies:   Amoxicillin, Sulfa antibiotics, and Terfenadine   Social History   Socioeconomic History    Marital status: Single    Spouse name: Not on file   Number of children: 0   Years of education: Not on file   Highest education  level: Not on file  Occupational History   Not on file  Tobacco Use   Smoking status: Never   Smokeless tobacco: Never  Vaping Use   Vaping Use: Never used  Substance and Sexual Activity   Alcohol use: No    Alcohol/week: 0.0 standard drinks    Comment: none   Drug use: No   Sexual activity: Never  Other Topics Concern   Not on file  Social History Narrative   No caffeine intake except for chocolate.  Exercised-walking   Social Determinants of Radio broadcast assistant Strain: Not on file  Food Insecurity: Not on file  Transportation Needs: Not on file  Physical Activity: Not on file  Stress: Not on file  Social Connections: Not on file     Family History: The patient's family history includes Breast cancer (age of onset: 56) in his mother; Heart attack in his father; Heart disease in his father; Huntington's disease in his mother; Hypertension in his brother and father; Stroke in his father. There is no history of Hyperlipidemia or Diabetes.  ROS:   Please see the history of present illness.    All other systems reviewed and are negative.  EKGs/Labs/Other Studies Reviewed:    The following studies were reviewed today: I discussed my findings with the patient at length   Recent Labs: 07/03/2020: TSH 4.40 06/06/2021: ALT 186; BUN 7; Creatinine, Ser 0.86; Hemoglobin 12.6; Platelets 348.0; Potassium 3.5; Sodium 142  Recent Lipid Panel    Component Value Date/Time   CHOL 135 06/06/2021 1424   TRIG 169.0 (H) 06/06/2021 1424   HDL 35.40 (L) 06/06/2021 1424   CHOLHDL 4 06/06/2021 1424   VLDL 33.8 06/06/2021 1424   LDLCALC 66 06/06/2021 1424   LDLDIRECT 56.0 08/21/2020 0919    Physical Exam:    VS:  There were no vitals taken for this visit.    Wt Readings from Last 3 Encounters:  06/06/21 169 lb 6.4 oz (76.8 kg)  06/03/21 167 lb  12.8 oz (76.1 kg)  05/29/21 171 lb 4 oz (77.7 kg)     GEN: Patient is in no acute distress HEENT: Normal NECK: No JVD; No carotid bruits LYMPHATICS: No lymphadenopathy CARDIAC: Hear sounds regular, 2/6 systolic murmur at the apex. RESPIRATORY:  Clear to auscultation without rales, wheezing or rhonchi  ABDOMEN: Soft, non-tender, non-distended MUSCULOSKELETAL:  No edema; No deformity  SKIN: Warm and dry NEUROLOGIC:  Alert and oriented x 3 PSYCHIATRIC:  Normal affect   Signed, Jenean Lindau, MD  06/28/2021 2:42 PM    [facility]

## 2021-06-29 LAB — COMPREHENSIVE METABOLIC PANEL
AG Ratio: 1.9 (calc) (ref 1.0–2.5)
ALT: 34 U/L (ref 9–46)
AST: 25 U/L (ref 10–35)
Albumin: 4.4 g/dL (ref 3.6–5.1)
Alkaline phosphatase (APISO): 75 U/L (ref 35–144)
BUN: 8 mg/dL (ref 7–25)
CO2: 32 mmol/L (ref 20–32)
Calcium: 9.7 mg/dL (ref 8.6–10.3)
Chloride: 103 mmol/L (ref 98–110)
Creat: 0.92 mg/dL (ref 0.70–1.35)
Globulin: 2.3 g/dL (calc) (ref 1.9–3.7)
Glucose, Bld: 112 mg/dL — ABNORMAL HIGH (ref 65–99)
Potassium: 3.9 mmol/L (ref 3.5–5.3)
Sodium: 142 mmol/L (ref 135–146)
Total Bilirubin: 0.7 mg/dL (ref 0.2–1.2)
Total Protein: 6.7 g/dL (ref 6.1–8.1)

## 2021-06-29 LAB — GAMMA GT: GGT: 23 U/L (ref 3–70)

## 2021-07-02 DIAGNOSIS — M5134 Other intervertebral disc degeneration, thoracic region: Secondary | ICD-10-CM | POA: Diagnosis not present

## 2021-07-02 DIAGNOSIS — G894 Chronic pain syndrome: Secondary | ICD-10-CM | POA: Diagnosis not present

## 2021-07-02 DIAGNOSIS — M47812 Spondylosis without myelopathy or radiculopathy, cervical region: Secondary | ICD-10-CM | POA: Diagnosis not present

## 2021-07-02 DIAGNOSIS — M5030 Other cervical disc degeneration, unspecified cervical region: Secondary | ICD-10-CM | POA: Diagnosis not present

## 2021-07-09 ENCOUNTER — Other Ambulatory Visit: Payer: Self-pay | Admitting: Family Medicine

## 2021-07-25 ENCOUNTER — Other Ambulatory Visit: Payer: Self-pay | Admitting: Podiatry

## 2021-08-09 ENCOUNTER — Other Ambulatory Visit: Payer: Self-pay | Admitting: Internal Medicine

## 2021-08-17 ENCOUNTER — Other Ambulatory Visit: Payer: Self-pay | Admitting: Family Medicine

## 2021-08-22 ENCOUNTER — Other Ambulatory Visit: Payer: Self-pay | Admitting: Family Medicine

## 2021-08-22 DIAGNOSIS — F322 Major depressive disorder, single episode, severe without psychotic features: Secondary | ICD-10-CM

## 2021-09-02 ENCOUNTER — Other Ambulatory Visit: Payer: Self-pay | Admitting: Family Medicine

## 2021-09-02 DIAGNOSIS — K649 Unspecified hemorrhoids: Secondary | ICD-10-CM

## 2021-09-02 MED ORDER — HYDROCORTISONE (PERIANAL) 2.5 % EX CREA: 1.0000 "application " | TOPICAL_CREAM | Freq: Two times a day (BID) | CUTANEOUS | 0 refills | Status: DC

## 2021-09-02 MED ORDER — EPINEPHRINE 0.3 MG/0.3ML IJ SOAJ: 0.3000 mg | INTRAMUSCULAR | 0 refills | Status: DC | PRN

## 2021-09-02 MED ORDER — POLYETHYLENE GLYCOL 3350 17 GM/SCOOP PO POWD: 17.0000 g | Freq: Every day | ORAL | 0 refills | Status: DC | PRN

## 2021-09-03 DIAGNOSIS — Z79891 Long term (current) use of opiate analgesic: Secondary | ICD-10-CM | POA: Diagnosis not present

## 2021-09-03 DIAGNOSIS — M5134 Other intervertebral disc degeneration, thoracic region: Secondary | ICD-10-CM | POA: Diagnosis not present

## 2021-09-03 DIAGNOSIS — M5030 Other cervical disc degeneration, unspecified cervical region: Secondary | ICD-10-CM | POA: Diagnosis not present

## 2021-09-03 DIAGNOSIS — G9589 Other specified diseases of spinal cord: Secondary | ICD-10-CM | POA: Diagnosis not present

## 2021-09-03 DIAGNOSIS — G894 Chronic pain syndrome: Secondary | ICD-10-CM | POA: Diagnosis not present

## 2021-09-10 ENCOUNTER — Other Ambulatory Visit: Payer: Self-pay

## 2021-09-10 ENCOUNTER — Ambulatory Visit (INDEPENDENT_AMBULATORY_CARE_PROVIDER_SITE_OTHER): Payer: Medicaid Other | Admitting: Internal Medicine

## 2021-09-10 ENCOUNTER — Encounter: Payer: Self-pay | Admitting: Internal Medicine

## 2021-09-10 VITALS — BP 110/72 | HR 89 | Ht 70.0 in | Wt 181.2 lb

## 2021-09-10 DIAGNOSIS — E1159 Type 2 diabetes mellitus with other circulatory complications: Secondary | ICD-10-CM

## 2021-09-10 DIAGNOSIS — E785 Hyperlipidemia, unspecified: Secondary | ICD-10-CM | POA: Diagnosis not present

## 2021-09-10 DIAGNOSIS — E1165 Type 2 diabetes mellitus with hyperglycemia: Secondary | ICD-10-CM | POA: Diagnosis not present

## 2021-09-10 LAB — POCT GLYCOSYLATED HEMOGLOBIN (HGB A1C): Hemoglobin A1C: 6.7 % — AB (ref 4.0–5.6)

## 2021-09-10 MED ORDER — OZEMPIC (0.25 OR 0.5 MG/DOSE) 2 MG/1.5ML ~~LOC~~ SOPN
0.5000 mg | PEN_INJECTOR | SUBCUTANEOUS | 5 refills | Status: DC
Start: 2021-09-10 — End: 2022-07-23

## 2021-09-10 NOTE — Progress Notes (Signed)
Patient ID: Chase [name], male   DOB: 09-27-1960, 61 y.o.   MRN: [id number] ? ?This visit occurred during the SARS-CoV-2 public health emergency.  Safety protocols were in place, including screening questions prior to the visit, additional usage of staff PPE, and extensive cleaning of exam room while observing appropriate contact time as indicated for disinfecting solutions.  ? ?HPI: ?Chase [name] is a 61 y.o.-year-old male, presenting for follow-up for DM2, dx in 2014, non-insulin-dependent, uncontrolled, with complications (PVD, cerebro-vascular ds - s/p "mini"strokes, PN). Last visit 3 months ago. ?He had [employer], now [place] only. ? ?Interim history: ?He has blurry vision.  No increased urination, chest pain.  ?He continues to have anxiety. ? ?HbA1c levels reviewed: ?Lab Results  ?Component Value Date  ? HGBA1C 6.7 (A) 06/03/2021  ? HGBA1C 6.2 (A) 01/03/2021  ? HGBA1C 6.7 (A) 09/10/2020  ? HGBA1C 8.2 (H) 05/30/2020  ? HGBA1C 8.3 (H) 05/24/2020  ? HGBA1C 9.7 (H) 04/05/2020  ? HGBA1C 8.3 (H) 05/24/2019  ? HGBA1C 7.9 (A) 08/04/2018  ? HGBA1C 7.5 (A) 12/21/2017  ? HGBA1C 7.4 09/17/2017  ? HGBA1C 7.8 06/19/2017  ? HGBA1C 7.1 (H) 02/19/2017  ? HGBA1C 7.4 02/21/2016  ? HGBA1C 9.8 (H) 11/13/2015  ? HGBA1C 6.8 (H) 02/11/2015  ? HGBA1C 6.8 (H) 10/03/2014  ? HGBA1C 6.6 (H) 05/26/2014  ? HGBA1C 6.4 06/03/2013  ? HGBA1C 6.7 (H) 09/17/2012  ? HGBA1C 6.2 03/22/2012  ? ?Pt is on a regimen of: ? - Metformin 500 mg 1x a day >> 2x >> 1x a day b/c large puil - occasionally choking ?- Jardiance 25 mg in am >> at night  ?- Januvia 100 mg in am >> Victoza 1.8 mg weekly-started 05/2020 ?In 11/2018, we tried to change from Januvia to [place] but this was not covered. ?Stopped Glipizide b/c rash. ?Stopped Invokana 100 mg in am >> only got this for 1 mo ?He was on Metformin 500 mg po bid - in liquid form, as he could not swallow pills. He gets gi upset. He stopped this in 2014. ? ?He is not checking sugars-fingerstick  values are not accurate due to Raynaud's syndrome, however, freestyle libre CGM was not approved by his insurance.  He checks sugars approximately 1-2x a day: ?- brunch:  108-134, 144 >> 115-146 >> 107-134, 147, 160 ?- 2h after brunch: 116 >> 147, 154, 217 >> 137-151 ?- before dinner: 150-200 >> n/c >> 115 >> 109, 149 >>  n/c ?- 2h after dinner:  180-210 >> 260 >> n/c ?- bedtime: 208 >> ? >> 164, 173 >> 121 >> n/c >> 145 ?- nighttime: n/c >> 108-138, 151 ?Lowest sugar was 1108 >> 115 >> 107 ;  he has hypoglycemia awareness in the 70s. ?Highest sugar was 149 >> 217 >> 261 (Prednisone). ? ?Glucometer: [employer] Next >> AccuChek guide ? ?In the past, he was occasional lemonade and ginger ale >> advised to stop - still drinking these but rarely. ? ?-+ Mild CKD, last BUN/creatinine:  ?Lab Results  ?Component Value Date  ? BUN 8 06/28/2021  ? BUN 7 06/06/2021  ? CREATININE 0.92 06/28/2021  ? CREATININE 0.86 06/06/2021  ? ?-+ HL.  last set of lipids: ?Lab Results  ?Component Value Date  ? CHOL 135 06/06/2021  ? HDL 35.40 (L) 06/06/2021  ? [place] 66 06/06/2021  ? LDLDIRECT 56.0 08/21/2020  ? TRIG 169.0 (H) 06/06/2021  ? CHOLHDL 4 06/06/2021  ?On pravastatin 40. ? ?- last eye exam  was in 2022: No DR reportedly.  Dr. Jabier Mutton. ? ?- He has numbness and tingling in his feet, also pain. ? 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 Has varicose veins >> wears compresion hoses. He also has a h/o kidney stones.  On allopurinol for gout. ?He has anxiety, PTSD from childhood.  ?Early in 2022, he was diagnosed with an intradural extramedullary spinal tumor.  It did not change in appearance on a recent MRI. ? ?ROS: ?+ See HPI. ? ?Past Medical History:  ?Diagnosis Date  ? Abnormality of gait 11/10/2012  ? Acute GI bleeding   ? Acute renal insufficiency 02/10/2015  ? Acute upper respiratory infection 08/10/2012  ? Allergic drug reaction 05/24/2020  ? Allergic rhinitis  10/16/2020  ? Allergy   ? Anemia 02/10/2015  ? Annual physical exam 11/26/2011  ? ANXIETY DEPRESSION 12/09/2007  ? Qualifier: Diagnosis of  By: Aurther Loft   ? 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 10/17/2014  ? Arthralgia 07/25/2010  ? Arthritis   ? Benign neoplasm of brain ([place]) 05/24/2019  ? Bigeminy ? 03/22/2012  ? Bilateral hearing loss 11/23/2019  ? Bilateral leg and foot pain 03/02/2017  ? Blood in stool   ? Blood in stool, frank 02/10/2015  ? Body mass index (BMI) 26.0-26.9, adult 03/15/2020  ? BPH without urinary obstruction 08/29/2020  ? Brain cyst   ? Central sleep apnea associated with atrial fibrillation ([place]) 03/12/2018  ? Cerebrovascular accident, old 11/16/2014  ? Chest discomfort 06/08/2019  ? Chronic back pain 11/28/2020  ? Chronic cough 08/27/2014  ? CXR 08/2014:  Mild hyperinflation, no acute process Arlyce Harman 09/2014:  Difficulty with procedure, but essentially normal +CVA's in past, with aspiration risk by history Speech evaluation 2016:     ? Chronic left shoulder pain 08/06/2020  ? Chronic pain syndrome 11/23/2019  ? Chronic pansinusitis 06/30/2018  ? 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  ? 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 Constipation   ? 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 DDD (degenerative disc disease), cervical 04/26/2019  ? DDD (degenerative disc disease), lumbar 04/26/2019  ? Decreased hearing of both ears 02/12/2013  ? Deviated septum 05/31/2018  ? Diabetes mellitus without complication ([place])   ? Difficulty urinating   ? DM (diabetes mellitus) type II uncontrolled, periph vascular disorder 01/02/2016  ? Dry skin 05/09/2019  ? Dysuria 11/29/2010  ? Essential (primary) hypertension 01/26/2015  ? Excessive daytime sleepiness 02/11/2018  ? FATIGUE 12/09/2007  ? Qualifier: Diagnosis of  By: Aurther Loft   ? Fibromyalgia 03/02/2017  ? Food intolerance 05/09/2019  ? GERD 12/09/2007  ? Qualifier: Diagnosis of  By: Niel Hummer MD, Lorinda Creed   ? GERD (gastroesophageal reflux disease)    ? GI bleed 02/10/2015  ? Headache(784.0)   ? Hearing loss   ? Hemangioma 02/12/2013  ? Hemorrhoid 05/22/2011  ? HTN (hypertension) 02/04/2010  ? Qualifier: Diagnosis of  By: Charlett Blake MD, Erline Levine    ? Hyperlipidemia   ? Hyperlipidemia associated with type 2 diabetes mellitus ([place]) 11/22/2020  ? Hyperlipidemia LDL goal <70 01/26/2015  ? Hypersomnia, persistent 11/05/2012  ? epworth of 18 points, was placed on CPAP in 2008 with very low AHI ( no number  Mentioned) and struggles ever since with PAP. .    ? Hypertension   ? Hypokalemia 08/29/2020  ? Hypotension 07/04/2020  ? Idiopathic chronic gout of multiple sites without tophus  11/23/2019  ? Insect bite 03/16/2018  ? Insomnia 08/05/2011  ? Internal and external hemorrhoids without complication 91/63/8466  ? Internal hemorrhoids 10/22/2014  ? 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 Leg swelling   ? Localized swelling of both lower legs 03/02/2017  ? Low back pain 07/20/2007  ? Qualifier: Diagnosis of  By: Niel Hummer MD, Lorinda Creed   ? Lower extremity edema 07/04/2020  ? Lower GI bleed   ? Mixed conductive and sensorineural hearing loss of right ear with restricted hearing of left ear 10/16/2020  ? Nasal congestion   ? Neck pain 03/15/2020  ? NEPHROLITHIASIS 08/13/2006  ? Qualifier: Diagnosis of  By: Beryle Lathe    ? Nocturia more than twice per night 02/11/2018  ? OSA on CPAP 10/17/2014  ? Otalgia of both ears 06/06/2021  ? Other chronic pain 03/02/2017  ? Other fatigue 07/04/2020  ? Pain in finger of right hand 10/06/2012  ? Pain in joint of right shoulder 02/21/2017  ? Palpitations 01/26/2015  ? Paresthesia 07/25/2010  ? Persistent atrial fibrillation ([place]) 08/29/2020  ? Persistent headaches 10/17/2014  ? 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 Qualifier: Diagnosis of  By: Niel Hummer MD, Lorinda Creed   ? PONV (postoperative nausea and vomiting)   ? Preoperative clearance 07/04/2020  ? Prolapsed hemorrhoids 10/05/2013  ? Radiculopathy, lumbar region 03/15/2020  ?  Rash 05/24/2019  ? Raynaud's disease 03/02/2017  ? Raynaud's phenomenon 03/02/2017  ? Rectal bleeding 02/12/2015  ? Rectal pain   ? Rectal ulcer with bleeding after hemorrhoid banding 02/11/2015  ? Rhinitis

## 2021-09-10 NOTE — Patient Instructions (Addendum)
Please continue: ?- Metformin 500 mg 1x a day ?- Victoza 1.8 mg before b'fast ? ?Move: ?- Jardiance 25 mg before b'fast ? ?Try to also switch from Victoza to Ozempic 0.5 mg weekly. ? ?Please return in 3-4 months with your sugar log.  ? ?

## 2021-09-26 ENCOUNTER — Other Ambulatory Visit: Payer: Self-pay | Admitting: Cardiology

## 2021-09-27 ENCOUNTER — Other Ambulatory Visit: Payer: Self-pay | Admitting: Family Medicine

## 2021-10-01 ENCOUNTER — Other Ambulatory Visit: Payer: Self-pay | Admitting: Family Medicine

## 2021-10-04 ENCOUNTER — Encounter: Payer: Self-pay | Admitting: Internal Medicine

## 2021-10-22 ENCOUNTER — Other Ambulatory Visit: Payer: Self-pay | Admitting: Family Medicine

## 2021-10-22 DIAGNOSIS — K649 Unspecified hemorrhoids: Secondary | ICD-10-CM

## 2021-10-31 ENCOUNTER — Other Ambulatory Visit: Payer: Self-pay | Admitting: Family Medicine

## 2021-11-04 ENCOUNTER — Other Ambulatory Visit: Payer: Self-pay | Admitting: Family Medicine

## 2021-11-04 DIAGNOSIS — I10 Essential (primary) hypertension: Secondary | ICD-10-CM

## 2021-11-18 ENCOUNTER — Other Ambulatory Visit: Payer: Self-pay | Admitting: Family Medicine

## 2021-12-08 ENCOUNTER — Other Ambulatory Visit: Payer: Self-pay | Admitting: Family Medicine

## 2021-12-10 IMAGING — CR DG LUMBAR SPINE COMPLETE 4+V
5 series · 5 of 5 positions shown · non-contrast
Comparison: MRI lumbar spine 10/25/2005.

CLINICAL DATA: Chronic low back pain.  No recent injury.

EXAM:
LUMBAR SPINE - COMPLETE 4+ VIEW

[t lumbar spine ap]
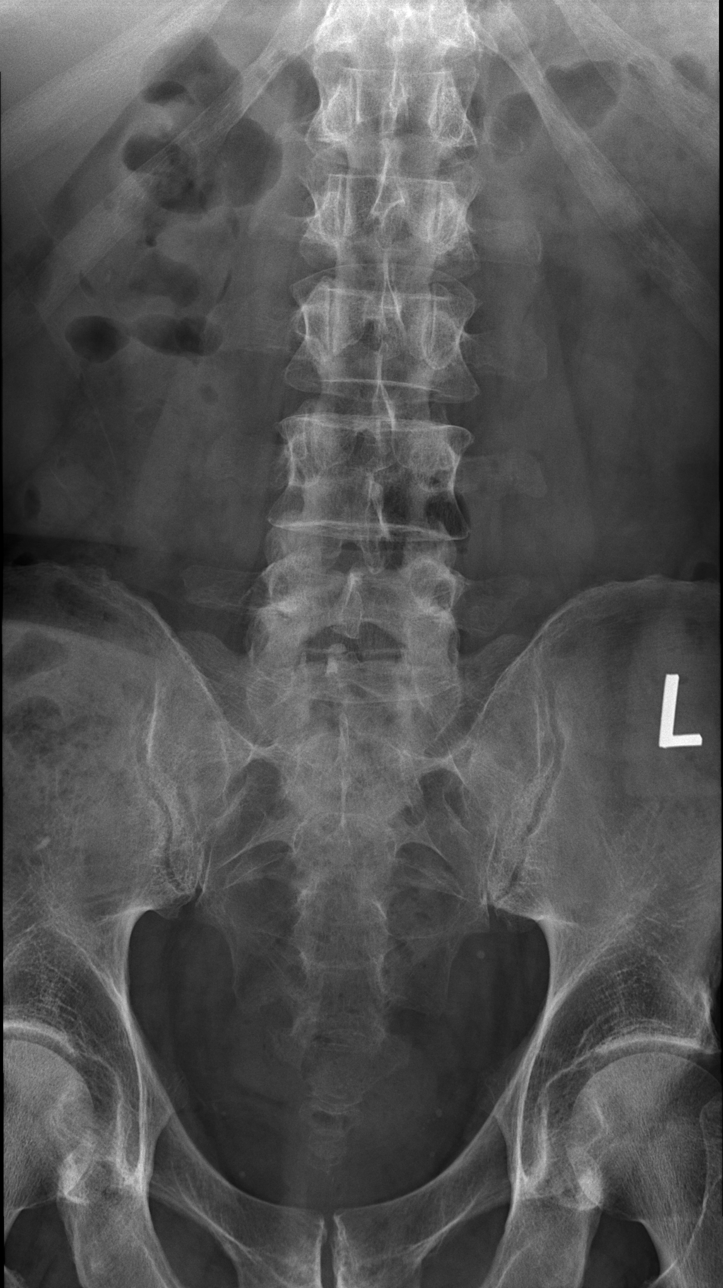

[t lumbar spine obl (1 of 2)]
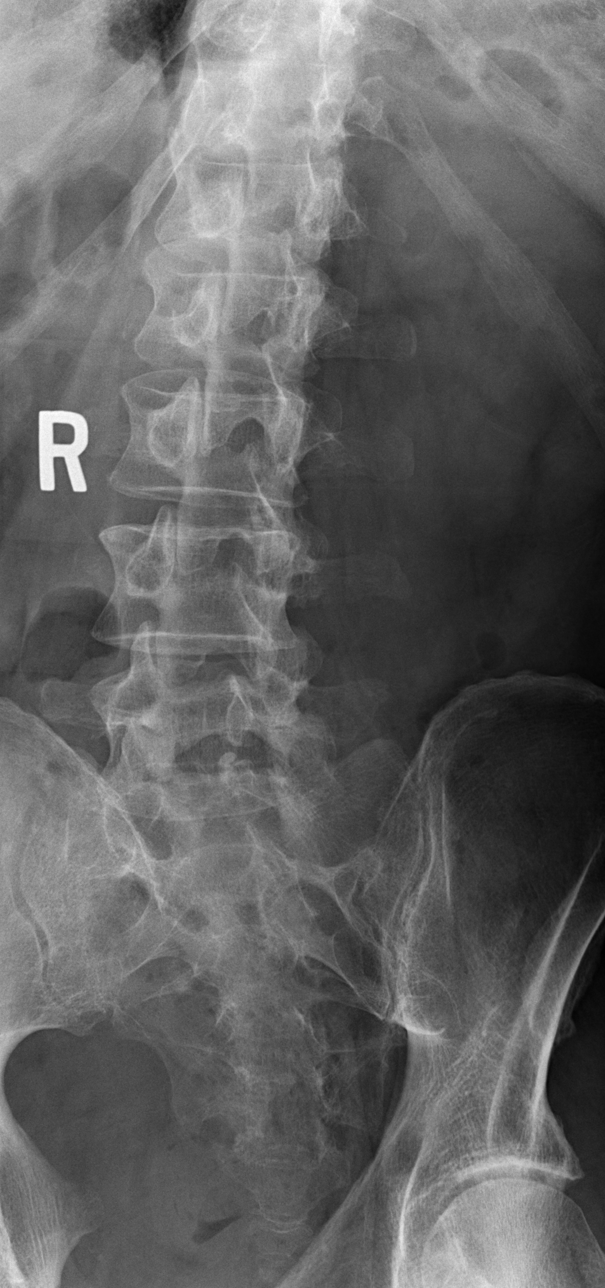

[t lumbar spine obl (2 of 2)]
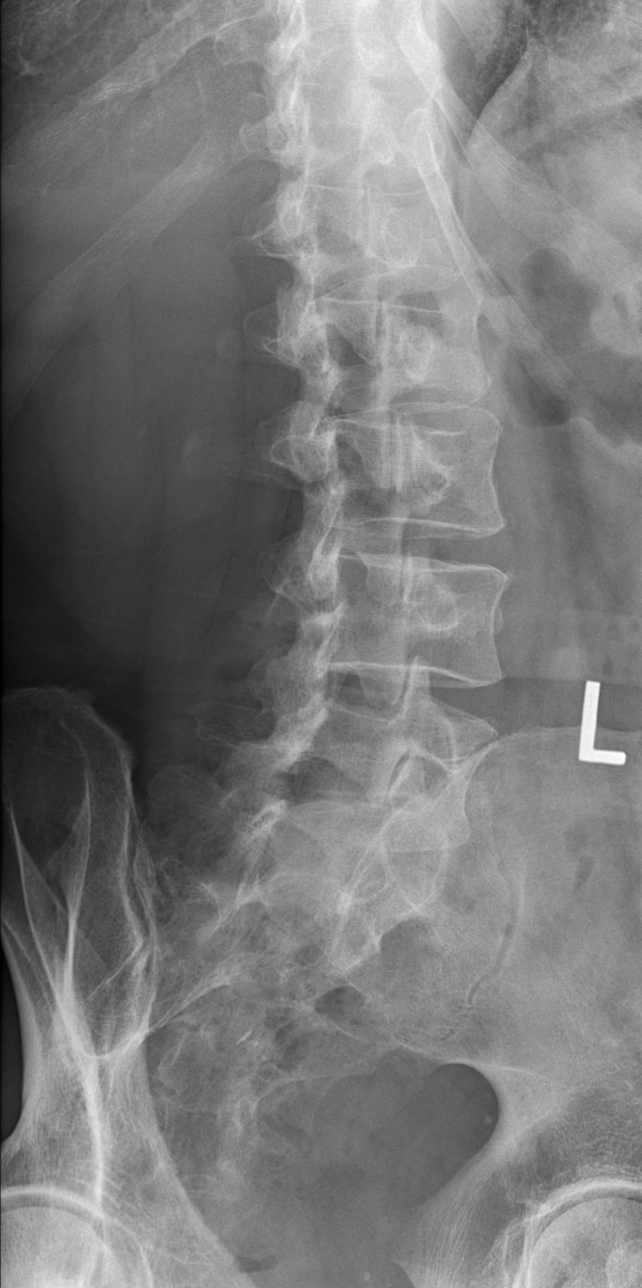

[t lumbar spine lat]
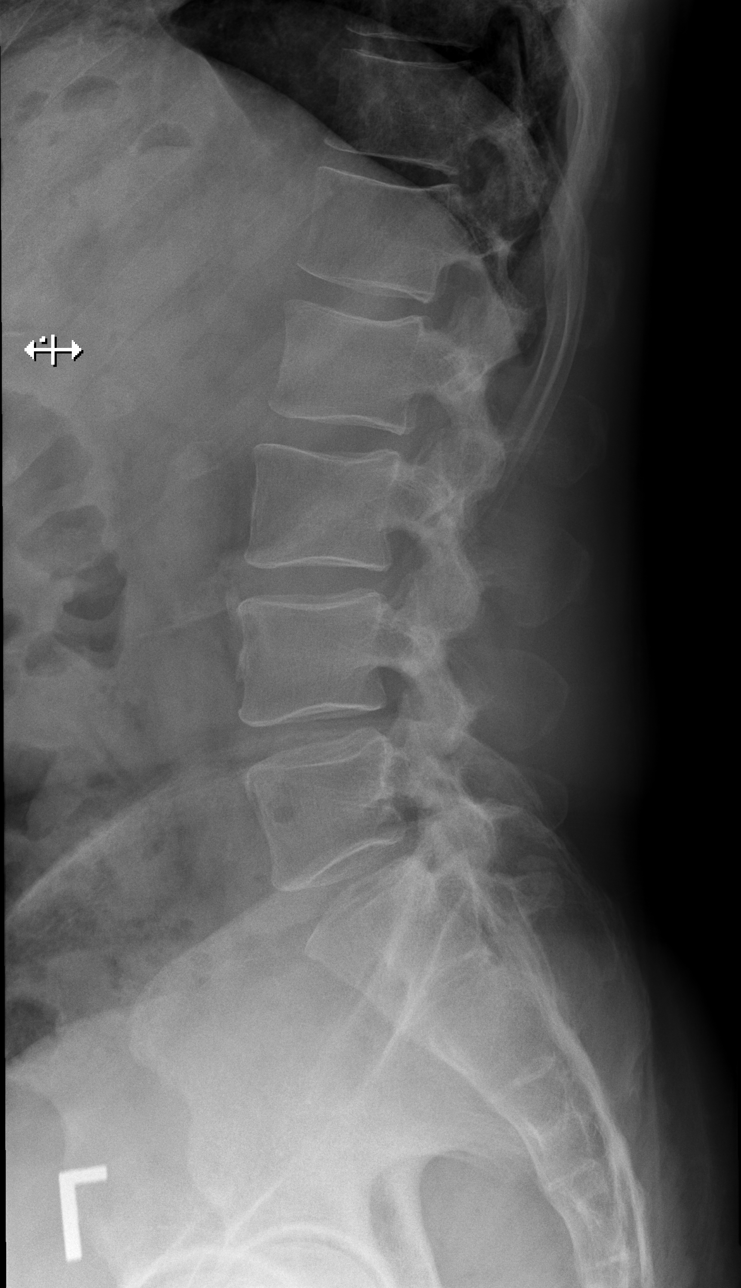

[t lumbar l-5 s-1 spot]
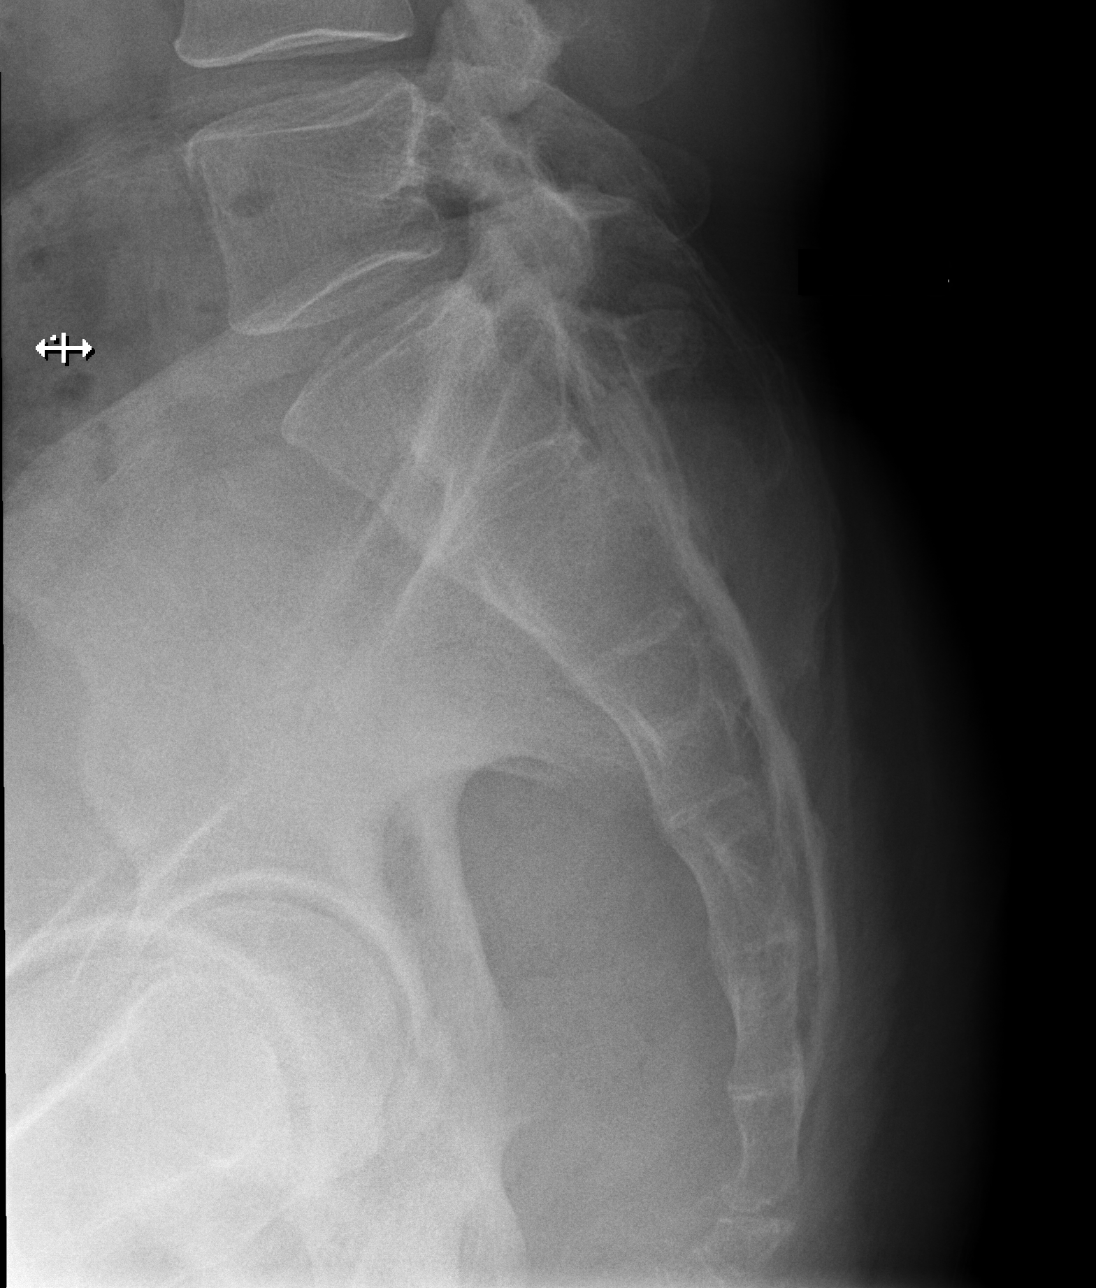

[5 of 5 positions shown; findings below may reference images not displayed]

FINDINGS: There is no evidence of lumbar spine fracture. Alignment is normal.
Intervertebral disc spaces are maintained.
IMPRESSION: Normal exam.

## 2021-12-11 ENCOUNTER — Other Ambulatory Visit: Payer: Self-pay | Admitting: Family Medicine

## 2021-12-11 DIAGNOSIS — E785 Hyperlipidemia, unspecified: Secondary | ICD-10-CM

## 2021-12-13 ENCOUNTER — Encounter: Payer: Medicaid Other | Admitting: Family Medicine

## 2021-12-16 ENCOUNTER — Other Ambulatory Visit: Payer: Self-pay | Admitting: Family Medicine

## 2021-12-16 ENCOUNTER — Encounter: Payer: Self-pay | Admitting: Family Medicine

## 2021-12-16 ENCOUNTER — Ambulatory Visit (INDEPENDENT_AMBULATORY_CARE_PROVIDER_SITE_OTHER): Payer: Medicaid Other | Admitting: Family Medicine

## 2021-12-16 ENCOUNTER — Telehealth: Payer: Self-pay

## 2021-12-16 VITALS — BP 124/80 | HR 83 | Temp 98.5°F | Resp 18 | Ht 70.0 in | Wt 177.4 lb

## 2021-12-16 DIAGNOSIS — N400 Enlarged prostate without lower urinary tract symptoms: Secondary | ICD-10-CM | POA: Diagnosis not present

## 2021-12-16 DIAGNOSIS — Z23 Encounter for immunization: Secondary | ICD-10-CM | POA: Diagnosis not present

## 2021-12-16 DIAGNOSIS — E1169 Type 2 diabetes mellitus with other specified complication: Secondary | ICD-10-CM | POA: Diagnosis not present

## 2021-12-16 DIAGNOSIS — Z0000 Encounter for general adult medical examination without abnormal findings: Secondary | ICD-10-CM | POA: Diagnosis not present

## 2021-12-16 DIAGNOSIS — I10 Essential (primary) hypertension: Secondary | ICD-10-CM | POA: Diagnosis not present

## 2021-12-16 DIAGNOSIS — E1165 Type 2 diabetes mellitus with hyperglycemia: Secondary | ICD-10-CM

## 2021-12-16 DIAGNOSIS — E785 Hyperlipidemia, unspecified: Secondary | ICD-10-CM | POA: Diagnosis not present

## 2021-12-16 DIAGNOSIS — K649 Unspecified hemorrhoids: Secondary | ICD-10-CM | POA: Diagnosis not present

## 2021-12-16 DIAGNOSIS — K921 Melena: Secondary | ICD-10-CM

## 2021-12-16 HISTORY — DX: Melena: K92.1

## 2021-12-16 LAB — COMPREHENSIVE METABOLIC PANEL
ALT: 21 U/L (ref 0–53)
AST: 16 U/L (ref 0–37)
Albumin: 4.4 g/dL (ref 3.5–5.2)
Alkaline Phosphatase: 73 U/L (ref 39–117)
BUN: 8 mg/dL (ref 6–23)
CO2: 33 mEq/L — ABNORMAL HIGH (ref 19–32)
Calcium: 9.2 mg/dL (ref 8.4–10.5)
Chloride: 101 mEq/L (ref 96–112)
Creatinine, Ser: 0.89 mg/dL (ref 0.40–1.50)
GFR: 92.62 mL/min (ref >60.00)
Glucose, Bld: 117 mg/dL — ABNORMAL HIGH (ref 70–99)
Potassium: 3.9 mEq/L (ref 3.5–5.1)
Sodium: 142 mEq/L (ref 135–145)
Total Bilirubin: 0.8 mg/dL (ref 0.2–1.2)
Total Protein: 6.6 g/dL (ref 6.0–8.3)

## 2021-12-16 LAB — CBC WITH DIFFERENTIAL/PLATELET
Basophils Absolute: 0 10*3/uL (ref 0.0–0.1)
Basophils Relative: 0.6 % (ref 0.0–3.0)
Eosinophils Absolute: 0.2 10*3/uL (ref 0.0–0.7)
Eosinophils Relative: 2.8 % (ref 0.0–5.0)
HCT: 38.6 % — ABNORMAL LOW (ref 39.0–52.0)
Hemoglobin: 13.1 g/dL (ref 13.0–17.0)
Lymphocytes Relative: 25.6 % (ref 12.0–46.0)
Lymphs Abs: 1.5 10*3/uL (ref 0.7–4.0)
MCHC: 34 g/dL (ref 30.0–36.0)
MCV: 87.3 fl (ref 78.0–100.0)
Monocytes Absolute: 0.7 10*3/uL (ref 0.1–1.0)
Monocytes Relative: 11.3 % (ref 3.0–12.0)
Neutro Abs: 3.5 10*3/uL (ref 1.4–7.7)
Neutrophils Relative %: 59.7 % (ref 43.0–77.0)
Platelets: 258 10*3/uL (ref 150.0–400.0)
RBC: 4.42 Mil/uL (ref 4.22–5.81)
RDW: 13.6 % (ref 11.5–15.5)
WBC: 5.8 10*3/uL (ref 4.0–10.5)

## 2021-12-16 LAB — LIPID PANEL
Cholesterol: 125 mg/dL (ref 0–200)
HDL: 28 mg/dL — ABNORMAL LOW (ref >39.00)
LDL Cholesterol: 67 mg/dL (ref 0–99)
NonHDL: 97.44
Total CHOL/HDL Ratio: 4
Triglycerides: 150 mg/dL — ABNORMAL HIGH (ref 0.0–149.0)
VLDL: 30 mg/dL (ref 0.0–40.0)

## 2021-12-16 LAB — PSA: PSA: 0.27 ng/mL (ref 0.10–4.00)

## 2021-12-16 MED ORDER — HYDROCORTISONE (PERIANAL) 2.5 % EX CREA: 1.0000 | TOPICAL_CREAM | Freq: Two times a day (BID) | CUTANEOUS | 2 refills | Status: DC

## 2021-12-16 NOTE — Patient Instructions (Signed)

## 2021-12-16 NOTE — Progress Notes (Signed)
Subjective:   By signing my name below, I, Chase [name], attest that this documentation has been prepared under the direction and in the presence of Ann Held DO 12/16/2021    Patient ID: Chase [name], male    DOB: [birth year], 61 y.o.   MRN: [id number]  No chief complaint on file.   HPI Patient is in today for a comprehensive physical exam  He is no longer seeing pain management and was referred to [facility] for his pain management but he was not able to get into contact with them. He was then referred to pain management at [facility] and has not received a call back yet. He has been leaving voicemail as he has not been connected with a representative.   He complains of issues passing a stool. His stool are also black and sticks to the toilet. He is not taking any OTC medications for his symptoms. He believes that his hemorrhoids are reappearing. He is interested in being referred to a urologist for urological symptoms. He believes that his last colonoscopy was around 2015.   He complains of his ears feeling "stopped up."   He has Medicaid and reports that he has never received Medicare.   He denies having any fever, new muscle pain, joint pain , new moles, congestion, sinus pain, sore throat, chest pain, palpations, cough, SOB ,wheezing,n/v/d constipation, blood in stool, dysuria, frequency, hematuria, at this time  He believes that his last colonoscopy was in 2015.  PSA last completed on 10/03/2014 He has not received the Shingles vaccine and is interested in receiving it for today's visit.   Past Medical History:  Diagnosis Date  . Abnormality of gait 11/10/2012  . Acute GI bleeding   . Acute renal insufficiency 02/10/2015  . Acute upper respiratory infection 08/10/2012  . Allergic drug reaction 05/24/2020  . Allergic rhinitis 10/16/2020  . Allergy   . Anemia 02/10/2015  . Annual physical exam 11/26/2011  . 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 Arthralgia 07/25/2010  . Arthritis   . Benign neoplasm of brain ([place]) 05/24/2019  . Bigeminy ? 03/22/2012  . Bilateral hearing loss 11/23/2019  . Bilateral leg and foot pain 03/02/2017  . Blood in stool   . Blood in stool, frank 02/10/2015  . Body mass index (BMI) 26.0-26.9, adult 03/15/2020  . BPH without urinary obstruction 08/29/2020  . Brain cyst   . 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 Cerebrovascular accident, old 11/16/2014  . Chest discomfort 06/08/2019  . Chronic back pain 11/28/2020  . Chronic cough 08/27/2014   CXR 08/2014:  Mild hyperinflation, no acute process Arlyce Harman 09/2014:  Difficulty with procedure, but essentially normal +CVA's in past, with aspiration risk by history Speech evaluation 2016:     . Chronic left shoulder pain 08/06/2020  . Chronic pain syndrome 11/23/2019  . Chronic pansinusitis 06/30/2018  . 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  . 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 Constipation   . 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 DDD (degenerative disc disease), cervical 04/26/2019  . DDD (degenerative disc disease), lumbar 04/26/2019  . Decreased hearing of both ears 02/12/2013  . Deviated septum 05/31/2018  . Diabetes mellitus without complication ([place])   . Difficulty urinating   . DM (diabetes mellitus) type II uncontrolled, periph vascular disorder 01/02/2016  . Dry skin 05/09/2019  .  Dysuria 11/29/2010  . Essential (primary) hypertension 01/26/2015  . Excessive daytime sleepiness 02/11/2018  . FATIGUE 12/09/2007   Qualifier: Diagnosis of  By: Cori Razor RN, Mikal Plane Fibromyalgia 03/02/2017  . Food intolerance 05/09/2019  . GERD 12/09/2007   Qualifier: Diagnosis of  By: Niel Hummer MD, Lorinda Creed   . GERD (gastroesophageal reflux disease)   . GI bleed 02/10/2015  . Headache(784.0)   . Hearing loss   . Hemangioma  02/12/2013  . Hemorrhoid 05/22/2011  . HTN (hypertension) 02/04/2010   Qualifier: Diagnosis of  By: Charlett Blake MD, Erline Levine    . Hyperlipidemia   . Hyperlipidemia associated with type 2 diabetes mellitus ([place]) 11/22/2020  . Hyperlipidemia LDL goal <70 01/26/2015  . Hypersomnia, persistent 11/05/2012   epworth of 18 points, was placed on CPAP in 2008 with very low AHI ( no number  Mentioned) and struggles ever since with PAP. .    . Hypertension   . Hypokalemia 08/29/2020  . Hypotension 07/04/2020  . 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/23/2019  . Insect bite 03/16/2018  . Insomnia 08/05/2011  . Internal and external hemorrhoids without complication 89/21/1941  . Internal hemorrhoids 10/22/2014  . 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 Leg swelling   . Localized swelling of both lower legs 03/02/2017  . Low back pain 07/20/2007   Qualifier: Diagnosis of  By: Niel Hummer MD, Lorinda Creed   . Lower extremity edema 07/04/2020  . Lower GI bleed   . Mixed conductive and sensorineural hearing loss of right ear with restricted hearing of left ear 10/16/2020  . Nasal congestion   . Neck pain 03/15/2020  . NEPHROLITHIASIS 08/13/2006   Qualifier: Diagnosis of  By: Beryle Lathe    . Nocturia more than twice per night 02/11/2018  . OSA on CPAP 10/17/2014  . Otalgia of both ears 06/06/2021  . Other chronic pain 03/02/2017  . Other fatigue 07/04/2020  . Pain in finger of right hand 10/06/2012  . Pain in joint of right shoulder 02/21/2017  . Palpitations 01/26/2015  . Paresthesia 07/25/2010  . Persistent atrial fibrillation ([place]) 08/29/2020  . Persistent headaches 10/17/2014  . 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 Preoperative clearance 07/04/2020  . Prolapsed hemorrhoids 10/05/2013  . Radiculopathy, lumbar region 03/15/2020  . Rash 05/24/2019  . Raynaud's  disease 03/02/2017  . Raynaud's phenomenon 03/02/2017  . Rectal bleeding 02/12/2015  . Rectal pain   . Rectal ulcer with bleeding after hemorrhoid banding 02/11/2015  . Rhinitis, chronic 12/23/2006   Qualifier: Diagnosis of  By: Cori Razor RN, Mikal Plane Right shoulder pain 02/15/2015  . Seasonal and perennial allergic rhinitis 05/09/2019  . Severe episode of recurrent major depressive disorder, without psychotic features ([place]) 08/29/2020  . Sinus infection 06/30/2014  . Sinusitis, acute, maxillary 02/02/2014  . Sleep apnea   . Sleep apnea with use of continuous positive airway pressure (CPAP) 02/11/2018  . SOB (shortness of breath) 07/04/2020  . Stroke ([place])   . Stroke ([place])   . Subacute confusional state 10/17/2014  . SYMPTOM, HYPERSOMNIA NOS 02/19/2007   Qualifier: Diagnosis of  By: [place]    . Thrombosed hemorrhoids   . Tick bite of back 11/23/2019  . Tinnitus of both ears 08/29/2020  . Trouble swallowing   . Unspecified hemorrhoids 02/10/2015  . Unspecified sleep apnea 11/05/2012  .  Varicose veins of lower extremities with other complications 96/29/5284  . Vertigo 03/19/2012    Past Surgical History:  Procedure Laterality Date  . CATARACT EXTRACTION     x 3  . [place]   cataracts  . FLEXIBLE SIGMOIDOSCOPY N/A 02/11/2015   Procedure: FLEXIBLE SIGMOIDOSCOPY;  Surgeon: Gatha Mayer, MD;  Location: [facility];  Service: Endoscopy;  Laterality: N/A;  . INNER EAR SURGERY     rt ear  . INNER EAR SURGERY  1992  . surgery - right arm     1983    Family History  Problem Relation Age of Onset  . Breast cancer Mother 76       breast  . Huntington's disease Mother   . Stroke Father   . Heart disease Father   . Hypertension Father   . Heart attack Father   . Hypertension Brother   . Hyperlipidemia Neg Hx   . Diabetes Neg Hx     Social History   Socioeconomic History  . Marital status: Single    Spouse name: Not on file  . Number of  children: 0  . Years of education: Not on file  . Highest education level: Not on file  Occupational History  . Not on file  Tobacco Use  . Smoking status: Never  . Smokeless tobacco: Never  Vaping Use  . Vaping Use: Never used  Substance and Sexual Activity  . Alcohol use: No    Alcohol/week: 0.0 standard drinks of alcohol    Comment: none  . Drug use: No  . Sexual activity: Never  Other Topics Concern  . Not on file  Social History Narrative   No caffeine intake except for chocolate.  Exercised-walking   Social Determinants of Radio broadcast assistant Strain: Not on file  Food Insecurity: Not on file  Transportation Needs: Not on file  Physical Activity: Not on file  Stress: Not on file  Social Connections: Not on file  Intimate Partner Violence: Not on file    Outpatient Medications Prior to Visit  Medication Sig Dispense Refill  . hydrocortisone (ANUSOL-HC) 2.5 % rectal cream Place 1 application. rectally 2 (two) times daily. 30 g 0  . allopurinol (ZYLOPRIM) 100 MG tablet TAKE 1 TABLET BY MOUTH TWICE A DAY 180 tablet 1  . amLODipine (NORVASC) 2.5 MG tablet Take 1 tablet (2.5 mg total) by mouth daily. 90 tablet 2  . Azelastine HCl (ASTEPRO) 0.15 % SOLN Place 1 spray into the nose in the morning and at bedtime. 1 mL 3  . BD PEN NEEDLE NANO 2ND GEN 32G X 4 MM MISC USE ONCE DAILY AS DIRECTED 100 each 3  . clotrimazole-betamethasone (LOTRISONE) cream Apply 1 application topically as needed for rash.    . cyclobenzaprine (FLEXERIL) 10 MG tablet TAKE 1 TABLET BY MOUTH TWICE A DAY AS NEEDED FOR MUSCLE SPASMS 30 tablet 1  . empagliflozin (JARDIANCE) 25 MG TABS tablet Take 1 tablet (25 mg total) by mouth daily. 90 tablet 3  . EPINEPHrine 0.3 mg/0.3 mL IJ SOAJ injection Inject 0.3 mg into the muscle as needed for anaphylaxis. 1 each 0  . famotidine (PEPCID) 40 MG/5ML suspension Take 2.5 mLs by mouth daily.    Marland Kitchen FIBER SELECT GUMMIES PO Take 1 tablet by mouth daily.    .  fluticasone (CUTIVATE) 0.05 % cream Apply 1 application topically 2 (two) times daily.    Marland Kitchen gabapentin (NEURONTIN) 100 MG capsule TAKE 1  CAPSULE BY MOUTH THREE TIMES A DAY 90 capsule 3  . GAVILAX 17 GM/SCOOP powder TAKE 17 G BY MOUTH DAILY AS NEEDED. 238 g 5  . glucose blood test strip Use as instructed to check blood sugar 1 daily. 100 each 3  . hydrOXYzine (ATARAX) 25 MG tablet TAKE 1 TABLET BY MOUTH EVERY 8 HOURS AS NEEDED FOR ANXIETY OR ITCHING 90 tablet 5  . losartan (COZAAR) 100 MG tablet TAKE 1 TABLET BY MOUTH EVERY DAY 90 tablet 1  . metFORMIN (GLUCOPHAGE) 500 MG tablet Take 1 tablet (500 mg total) by mouth 2 (two) times daily with a meal. 180 tablet 3  . metoprolol tartrate (LOPRESSOR) 50 MG tablet TAKE 1 TABLET BY MOUTH EVERY DAY 90 tablet 0  . pravastatin (PRAVACHOL) 20 MG tablet TAKE 2 TABLETS (40 MG TOTAL) BY MOUTH DAILY. 180 tablet 1  . Semaglutide,0.25 or 0.'5MG'$ /DOS, (OZEMPIC, 0.25 OR 0.5 MG/DOSE,) 2 MG/1.5ML SOPN Inject 0.5 mg into the skin once a week. 4.5 mL 5  . terazosin (HYTRIN) 5 MG capsule Take 5 mg by mouth daily.    Marland Kitchen triamcinolone (NASACORT) 55 MCG/ACT AERO nasal inhaler Place 2 sprays into the nose daily. 1 each 12  . venlafaxine XR (EFFEXOR-XR) 75 MG 24 hr capsule TAKE 1 CAPSULE BY MOUTH DAILY WITH BREAKFAST. 90 capsule 1  . VICTOZA 18 MG/3ML SOPN INJECT 1.8 MG UNDER THE SKIN ONCE A DAY BEFORE BREAKFAST. 9 mL 3   No facility-administered medications prior to visit.    Allergies  Allergen Reactions  . Amoxicillin Hives, Itching and Other (See Comments)    White tongue; ? hives  . Sulfa Antibiotics Hives and Rash  . Terfenadine     ? reaction    Review of Systems  Constitutional:  Negative for fever.  HENT:  Negative for congestion, sinus pain and sore throat.        (+) Ear Fullness  Respiratory:  Negative for cough, shortness of breath and wheezing.   Cardiovascular:  Negative for chest pain and palpitations.  Gastrointestinal:  Negative for blood in stool,  constipation, diarrhea, nausea and vomiting.       (+) Black Stools (+) Difficulty Passing Stools   Genitourinary:  Negative for dysuria, frequency and hematuria.  Musculoskeletal:  Negative for joint pain and myalgias.  Skin:        (-) [place]  Neurological:  Positive for focal weakness.       Objective:    Physical Exam Constitutional:      General: He is not in acute distress.    Appearance: Normal appearance. He is not ill-appearing.  HENT:     Head: Normocephalic and atraumatic.     Right Ear: Tympanic membrane, ear canal and external ear normal.     Left Ear: Tympanic membrane, ear canal and external ear normal.  Eyes:     Extraocular Movements: Extraocular movements intact.     Pupils: Pupils are equal, round, and reactive to light.  Cardiovascular:     Rate and Rhythm: Normal rate and regular rhythm.     Heart sounds: Normal heart sounds. No murmur heard.    No gallop.  Pulmonary:     Effort: Pulmonary effort is normal. No respiratory distress.     Breath sounds: Normal breath sounds. No wheezing or rales.  Abdominal:     General: Bowel sounds are normal. There is no distension.     Palpations: Abdomen is soft.     Tenderness: There is abdominal  tenderness. There is no guarding.  Genitourinary:    Comments: (-) Blood Skin:    General: Skin is warm and dry.  Neurological:     Mental Status: He is alert and oriented to person, place, and time.  Psychiatric:        Judgment: Judgment normal.    There were no vitals taken for this visit. Wt Readings from Last 3 Encounters:  09/10/21 181 lb 3.2 oz (82.2 kg)  06/28/21 169 lb 1.9 oz (76.7 kg)  06/06/21 169 lb 6.4 oz (76.8 kg)    Diabetic Foot Exam - Simple   No data filed    Lab Results  Component Value Date   WBC 9.8 06/06/2021   HGB 12.6 (L) 06/06/2021   HCT 37.9 (L) 06/06/2021   PLT 348.0 06/06/2021   GLUCOSE 112 (H) 06/28/2021   CHOL 135 06/06/2021   TRIG 169.0 (H) 06/06/2021   HDL 35.40 (L)  06/06/2021   LDLDIRECT 56.0 08/21/2020   LDLCALC 66 06/06/2021   ALT 34 06/28/2021   AST 25 06/28/2021   NA 142 06/28/2021   K 3.9 06/28/2021   CL 103 06/28/2021   CREATININE 0.92 06/28/2021   BUN 8 06/28/2021   CO2 32 06/28/2021   TSH 4.40 07/03/2020   PSA 1.01 10/03/2014   INR 1.11 02/10/2015   HGBA1C 6.7 (A) 06/03/2021   MICROALBUR <0.7 05/24/2020    Lab Results  Component Value Date   TSH 4.40 07/03/2020   Lab Results  Component Value Date   WBC 9.8 06/06/2021   HGB 12.6 (L) 06/06/2021   HCT 37.9 (L) 06/06/2021   MCV 87.9 06/06/2021   PLT 348.0 06/06/2021   Lab Results  Component Value Date   NA 142 06/28/2021   K 3.9 06/28/2021   CO2 32 06/28/2021   GLUCOSE 112 (H) 06/28/2021   BUN 8 06/28/2021   CREATININE 0.92 06/28/2021   BILITOT 0.7 06/28/2021   ALKPHOS 73 06/06/2021   AST 25 06/28/2021   ALT 34 06/28/2021   PROT 6.7 06/28/2021   ALBUMIN 4.3 06/06/2021   CALCIUM 9.7 06/28/2021   ANIONGAP 9 02/12/2015   GFR 93.93 06/06/2021   Lab Results  Component Value Date   CHOL 135 06/06/2021   Lab Results  Component Value Date   HDL 35.40 (L) 06/06/2021   Lab Results  Component Value Date   LDLCALC 66 06/06/2021   Lab Results  Component Value Date   TRIG 169.0 (H) 06/06/2021   Lab Results  Component Value Date   CHOLHDL 4 06/06/2021   Lab Results  Component Value Date   HGBA1C 6.7 (A) 06/03/2021       Assessment & Plan:   Problem List Items Addressed This Visit   None  No orders of the defined types were placed in this encounter.   I, Chase [name], personally preformed the services described in this documentation.  All medical record entries made by the scribe were at my direction and in my presence.  I have reviewed the chart and discharge instructions (if applicable) and agree that the record reflects my personal performance and is accurate and complete. 12/16/2021   I,Amber Collins,acting as a scribe for Ann Held,  DO.,have documented all relevant documentation on the behalf of Ann Held, DO,as directed by  Ann Held, DO while in the presence of Ann Held, DO.   [employer]

## 2021-12-16 NOTE — Telephone Encounter (Signed)
General surgery called stated that they are a pediatric office and they don't take adults. So the referral needs to be sent to a different location.

## 2021-12-19 ENCOUNTER — Other Ambulatory Visit: Payer: Self-pay | Admitting: Family Medicine

## 2021-12-26 ENCOUNTER — Other Ambulatory Visit: Payer: Self-pay | Admitting: Family Medicine

## 2021-12-27 ENCOUNTER — Other Ambulatory Visit: Payer: Self-pay | Admitting: Family Medicine

## 2021-12-31 ENCOUNTER — Ambulatory Visit (INDEPENDENT_AMBULATORY_CARE_PROVIDER_SITE_OTHER): Payer: Medicaid Other

## 2021-12-31 DIAGNOSIS — Z23 Encounter for immunization: Secondary | ICD-10-CM

## 2021-12-31 NOTE — Progress Notes (Signed)
Chase [name] is a 61 y.o. male presents to the office today for 2nd shingles injection, per physician's orders. Original order: 12/16/21 Shingrix  0.5 ml (med), was administered R Deltoid (location) today. Patient tolerated injection. Patient due for follow up labs/provider appt: No.    Creft, Kristine Garbe L

## 2022-01-10 ENCOUNTER — Telehealth: Payer: Self-pay | Admitting: Family Medicine

## 2022-01-10 NOTE — Telephone Encounter (Signed)
..   Medicaid Managed Care   Unsuccessful Outreach Note  01/10/2022 Name: Chase [name] MRN: [id number] DOB: [birth year]  Referred by: Ann Held, DO Reason for referral : High Risk Managed Medicaid (I called the patient today to get him scheduled with the MM Team. I left my name and number on his VM.)   An unsuccessful telephone outreach was attempted today. The patient was referred to the case management team for assistance with care management and care coordination.   Follow Up Plan: The care management team will reach out to the patient again over the next 7-14 days.    [place]

## 2022-01-12 ENCOUNTER — Other Ambulatory Visit: Payer: Self-pay | Admitting: Family Medicine

## 2022-01-14 ENCOUNTER — Other Ambulatory Visit: Payer: Self-pay | Admitting: Internal Medicine

## 2022-01-14 ENCOUNTER — Ambulatory Visit (INDEPENDENT_AMBULATORY_CARE_PROVIDER_SITE_OTHER): Payer: Medicaid Other | Admitting: Internal Medicine

## 2022-01-14 ENCOUNTER — Encounter: Payer: Self-pay | Admitting: Internal Medicine

## 2022-01-14 VITALS — BP 132/78 | HR 81 | Ht 70.0 in | Wt 174.0 lb

## 2022-01-14 DIAGNOSIS — E1165 Type 2 diabetes mellitus with hyperglycemia: Secondary | ICD-10-CM | POA: Diagnosis not present

## 2022-01-14 DIAGNOSIS — E1159 Type 2 diabetes mellitus with other circulatory complications: Secondary | ICD-10-CM

## 2022-01-14 DIAGNOSIS — E785 Hyperlipidemia, unspecified: Secondary | ICD-10-CM

## 2022-01-14 LAB — POCT GLYCOSYLATED HEMOGLOBIN (HGB A1C): Hemoglobin A1C: 6.5 % — AB (ref 4.0–5.6)

## 2022-01-14 MED ORDER — METFORMIN HCL 500 MG PO TABS: 500.0000 mg | ORAL_TABLET | Freq: Two times a day (BID) | ORAL | 3 refills | Status: DC

## 2022-01-14 MED ORDER — FREESTYLE LIBRE 2 READER DEVI: 1.0000 | Freq: Every day | 0 refills | Status: DC

## 2022-01-14 MED ORDER — FREESTYLE LIBRE 2 SENSOR MISC: 1.0000 | 3 refills | Status: DC

## 2022-01-14 MED ORDER — EMPAGLIFLOZIN 25 MG PO TABS
25.0000 mg | ORAL_TABLET | Freq: Every day | ORAL | 3 refills | Status: DC
Start: 2022-01-14 — End: 2022-01-15

## 2022-01-14 NOTE — Addendum Note (Signed)
Addended by: Sarina Ill on: 01/14/2022 02:34 PM   Modules accepted: Orders

## 2022-01-14 NOTE — Patient Instructions (Addendum)
Please continue: - Metformin 500 mg 1x a day - Jardiance 25 mg before b'fast - Ozempic 0.5 mg weekly  Please return in 4 months with your sugar log.

## 2022-01-14 NOTE — Progress Notes (Signed)
Patient ID: Chase [name], male   DOB: 03-Feb-1961, 61 y.o.   MRN: [id number]  HPI: Chase [name] is a 61 y.o.-year-old male, presenting for follow-up for DM2, dx in 2014, non-insulin-dependent, uncontrolled, with complications (PVD, cerebro-vascular ds - s/p "mini"strokes, PN). Last visit 4 months ago. He had [employer], now [place] only.  Interim history: He has blurry vision and increased urination.  He is on terazosin and finasteride. He continues to have memory loss and difficulty word finding.  HbA1c levels reviewed: Lab Results  Component Value Date   HGBA1C 6.7 (A) 06/03/2021   HGBA1C 6.2 (A) 01/03/2021   HGBA1C 6.7 (A) 09/10/2020   HGBA1C 8.2 (H) 05/30/2020   HGBA1C 8.3 (H) 05/24/2020   HGBA1C 9.7 (H) 04/05/2020   HGBA1C 8.3 (H) 05/24/2019   HGBA1C 7.9 (A) 08/04/2018   HGBA1C 7.5 (A) 12/21/2017   HGBA1C 7.4 09/17/2017   HGBA1C 7.8 06/19/2017   HGBA1C 7.1 (H) 02/19/2017   HGBA1C 7.4 02/21/2016   HGBA1C 9.8 (H) 11/13/2015   HGBA1C 6.8 (H) 02/11/2015   HGBA1C 6.8 (H) 10/03/2014   HGBA1C 6.6 (H) 05/26/2014   HGBA1C 6.4 06/03/2013   HGBA1C 6.7 (H) 09/17/2012   HGBA1C 6.2 03/22/2012   Pt is on a regimen of:  - Metformin 500 mg 1x a day >> 2x >> 1x a day b/c problems with swallowing - Jardiance 25 mg at night - Januvia 100 mg in am >> Victoza 1.8 mg weekly-started 05/2020 >> Ozempic 0.5 mg weekly In 11/2018, we tried to change from Januvia to Chase [name] but this was not covered. Stopped Glipizide b/c rash. Stopped Invokana 100 mg in am >> only got this for 1 mo He was on Metformin 500 mg po bid - in liquid form, as he could not swallow pills. He gets gi upset. He stopped this in 2014.  He is not checking sugars-fingerstick values are not accurate due to Raynaud's syndrome, however, freestyle libre CGM was not approved by his insurance.  He checks sugars approximately 1-2x a day: - brunch:  108-134, 144 >> 115-146 >> 107-134, 147, 160 >> 123, 124-138 - 2h after  brunch: 116 >> 147, 154, 217 >> 137-151 >> N/c >> 99-135, 145 - before dinner: 150-200 >> n/c >> 115 >> 109, 149 >> n/c - 2h after dinner:  180-210 >> 260 >> n/c >> 163, 183 - bedtime: 208 >> ? >> 164, 173 >> 121 >> n/c >> 145 - nighttime: n/c >> 108-138, 151 Lowest sugar was 108 >> 115 >> 107 >> 80s;  he has hypoglycemia awareness in the 70s. Highest sugar was 149 >> 217 >> 261 (Prednisone) >> 183.  Glucometer: Chase [name] Next >> Chase [name]  In the past, he was occasional lemonade and ginger ale >> advised to stop - still drinking these but rarely.  -+ Mild CKD, last BUN/creatinine:  Lab Results  Component Value Date   BUN 8 12/16/2021   BUN 8 06/28/2021   CREATININE 0.89 12/16/2021   CREATININE 0.92 06/28/2021   -+ HL.  last set of lipids: Lab Results  Component Value Date   CHOL 125 12/16/2021   HDL 28.00 (L) 12/16/2021   LDLCALC 67 12/16/2021   LDLDIRECT 56.0 08/21/2020   TRIG 150.0 (H) 12/16/2021   CHOLHDL 4 12/16/2021  On pravastatin 40.  - last eye exam was in 2022: No DR reportedly.  Dr. Jabier [name].  - He has numbness and tingling in his feet, also pain.  Foot exam performed  09/10/2021.  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 Has varicose veins >> wears compresion hoses. He also has a h/o kidney stones.  On allopurinol for gout. He has anxiety, PTSD from childhood.  Early in 2022, he was diagnosed with an intradural extramedullary spinal tumor.  It did not change in appearance on a recent MRI.  ROS: + See HPI.  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 ([place]) 03/12/2018   Cerebrovascular accident, old 11/16/2014   Chest discomfort 06/08/2019   Chronic back pain 11/28/2020   Chronic cough 08/27/2014   CXR 08/2014:  Mild hyperinflation, no acute process Chase [name] 09/2014:  Difficulty with procedure, but essentially normal +CVA's in past, with aspiration risk by history Speech evaluation 2016:      Chronic left shoulder pain 08/06/2020   Chronic pain syndrome 11/23/2019   Chronic pansinusitis 06/30/2018   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   Constipation    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   DDD (degenerative disc disease), cervical 04/26/2019   DDD (degenerative disc disease), lumbar 04/26/2019   Decreased hearing of both ears 02/12/2013   Deviated septum 05/31/2018   Diabetes mellitus without complication ([place])    Difficulty urinating    DM (diabetes mellitus) type II uncontrolled, periph vascular disorder 01/02/2016   Dry skin 05/09/2019   Dysuria 11/29/2010   Essential (primary) hypertension 01/26/2015   Excessive daytime sleepiness 02/11/2018   FATIGUE 12/09/2007   Qualifier: Diagnosis of  By: Chase Razor RN, Chase [name]    Fibromyalgia 03/02/2017   Food intolerance 05/09/2019   GERD 12/09/2007   Qualifier: Diagnosis of  By: Chase Hummer MD, Chase [name]    GERD (gastroesophageal reflux disease)    GI bleed 02/10/2015   Headache(784.0)    Hearing loss    Hemangioma 02/12/2013   Hemorrhoid 05/22/2011   HTN (hypertension) 02/04/2010    Qualifier: Diagnosis of  By: Charlett Blake MD, Stacey     Hyperlipidemia    Hyperlipidemia associated with type 2 diabetes mellitus ([place]) 11/22/2020   Hyperlipidemia LDL goal <70 01/26/2015   Hypersomnia, persistent 11/05/2012   epworth of 18 points, was placed on CPAP in 2008 with very low AHI ( no number  Mentioned) and struggles ever since with PAP. Marland Kitchen     Hypertension    Hypokalemia 08/29/2020   Hypotension 07/04/2020   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/23/2019   Insect bite 03/16/2018   Insomnia 08/05/2011   Internal and external hemorrhoids without complication 16/96/7893  Internal hemorrhoids 10/22/2014   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   Leg swelling    Localized swelling of both lower legs 03/02/2017   Low back pain 07/20/2007   Qualifier: Diagnosis of  By: Chase Hummer MD, Chase [name]    Lower extremity edema 07/04/2020   Lower GI bleed    Mixed conductive and sensorineural hearing loss of right ear with restricted hearing of left ear 10/16/2020   Nasal congestion    Neck pain 03/15/2020   NEPHROLITHIASIS 08/13/2006   Qualifier: Diagnosis of  By: Beryle Lathe     Nocturia more than twice per night 02/11/2018   OSA on CPAP 10/17/2014   Otalgia of both ears 06/06/2021   Other chronic pain 03/02/2017   Other fatigue 07/04/2020   Pain in finger of right hand 10/06/2012   Pain in joint of right shoulder 02/21/2017   Palpitations 01/26/2015   Paresthesia 07/25/2010   Persistent atrial fibrillation ([place]) 08/29/2020   Persistent headaches 10/17/2014   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   Qualifier: Diagnosis of  By: Chase Hummer MD, Izell [place] R    PONV (postoperative nausea and vomiting)    Preoperative clearance 07/04/2020   Prolapsed hemorrhoids 10/05/2013   Radiculopathy, lumbar region 03/15/2020   Rash 05/24/2019   Raynaud's disease 03/02/2017   Raynaud's phenomenon 03/02/2017   Rectal bleeding 02/12/2015   Rectal pain    Rectal ulcer  with bleeding after hemorrhoid banding 02/11/2015   Rhinitis, chronic 12/23/2006   Qualifier: Diagnosis of  By: Chase Razor RN, [place] G    Right shoulder pain 02/15/2015   Seasonal and perennial allergic rhinitis 05/09/2019   Severe episode of recurrent major depressive disorder, without psychotic features ([place]) 08/29/2020   Sinus infection 06/30/2014   Sinusitis, acute, maxillary 02/02/2014   Sleep apnea    Sleep apnea with use of continuous positive airway pressure (CPAP) 02/11/2018   SOB (shortness of breath) 07/04/2020   Stroke ([place])    Stroke ([place])    Subacute confusional state 10/17/2014   SYMPTOM, HYPERSOMNIA NOS 02/19/2007   Qualifier: Diagnosis of  By: Tilden Dome     Thrombosed hemorrhoids    Tick bite of back 11/23/2019   Tinnitus of both ears 08/29/2020   Trouble swallowing    Unspecified hemorrhoids 02/10/2015   Unspecified sleep apnea 11/05/2012   Varicose veins of lower extremities with other complications 58/85/0277   Vertigo 03/19/2012   Past Surgical History:  Procedure Laterality Date   CATARACT EXTRACTION     x 3   EYE SURGERY  1968, 1985, 1987   cataracts   FLEXIBLE SIGMOIDOSCOPY N/A 02/11/2015   Procedure: FLEXIBLE SIGMOIDOSCOPY;  Surgeon: Gatha Mayer, MD;  Location: [facility];  Service: Endoscopy;  Laterality: N/A;   INNER EAR SURGERY     rt ear   INNER EAR SURGERY  1992   surgery - right arm     1983   Social History   Socioeconomic History   Marital status: Single    Spouse name: Not on file   Number of children: 0   Years of education: Not on file   Highest education level: Not on file  Occupational History   Not on file  Tobacco Use   Smoking status: Never   Smokeless tobacco: Never  Vaping Use   Vaping Use: Never used  Substance and Sexual Activity   Alcohol use: No    Alcohol/week: 0.0 standard drinks of alcohol    Comment: none   Drug use: No  Sexual activity: Never  Other Topics Concern   Not on file  Social History  Narrative   No caffeine intake except for chocolate.  Exercised-walking   Social Determinants of Radio broadcast assistant Strain: Not on file  Food Insecurity: Not on file  Transportation Needs: Not on file  Physical Activity: Not on file  Stress: Not on file  Social Connections: Not on file  Intimate Partner Violence: Not on file   Current Outpatient Medications on File Prior to Visit  Medication Sig Dispense Refill   allopurinol (ZYLOPRIM) 100 MG tablet TAKE 1 TABLET BY MOUTH TWICE A DAY 180 tablet 1   amLODipine (NORVASC) 2.5 MG tablet Take 1 tablet (2.5 mg total) by mouth daily. 90 tablet 2   Azelastine HCl (ASTEPRO) 0.15 % SOLN Place 1 spray into the nose in the morning and at bedtime. 1 mL 3   BD PEN NEEDLE NANO 2ND GEN 32G X 4 MM MISC USE ONCE DAILY AS DIRECTED 100 each 3   clotrimazole-betamethasone (LOTRISONE) cream Apply 1 application topically as needed for rash.     cyclobenzaprine (FLEXERIL) 10 MG tablet TAKE 1 TABLET BY MOUTH TWICE A DAY AS NEEDED FOR MUSCLE SPASMS 30 tablet 1   empagliflozin (JARDIANCE) 25 MG TABS tablet Take 1 tablet (25 mg total) by mouth daily. 90 tablet 3   EPINEPHrine 0.3 mg/0.3 mL IJ SOAJ injection Inject 0.3 mg into the muscle as needed for anaphylaxis. 1 each 0   famotidine (PEPCID) 40 MG/5ML suspension Take 2.5 mLs by mouth daily.     FIBER SELECT GUMMIES PO Take 1 tablet by mouth daily.     fluticasone (CUTIVATE) 0.05 % cream Apply 1 application topically 2 (two) times daily.     gabapentin (NEURONTIN) 100 MG capsule TAKE 1 CAPSULE BY MOUTH THREE TIMES A DAY 90 capsule 3   GAVILAX 17 GM/SCOOP powder TAKE 17 G BY MOUTH DAILY AS NEEDED. 238 g 5   glucose blood test strip Use as instructed to check blood sugar 1 daily. 100 each 3   hydrocortisone (ANUSOL-HC) 2.5 % rectal cream Place 1 Application rectally 2 (two) times daily. 30 g 2   hydrOXYzine (ATARAX) 25 MG tablet TAKE 1 TABLET BY MOUTH EVERY 8 HOURS AS NEEDED FOR ANXIETY OR ITCHING 90  tablet 5   losartan (COZAAR) 100 MG tablet TAKE 1 TABLET BY MOUTH EVERY DAY 90 tablet 1   metFORMIN (GLUCOPHAGE) 500 MG tablet Take 1 tablet (500 mg total) by mouth 2 (two) times daily with a meal. 180 tablet 3   metoprolol tartrate (LOPRESSOR) 50 MG tablet TAKE 1 TABLET BY MOUTH EVERY DAY 90 tablet 0   pravastatin (PRAVACHOL) 20 MG tablet TAKE 2 TABLETS (40 MG TOTAL) BY MOUTH DAILY. 180 tablet 1   Semaglutide,0.25 or 0.'5MG'$ /DOS, (OZEMPIC, 0.25 OR 0.5 MG/DOSE,) 2 MG/1.5ML SOPN Inject 0.5 mg into the skin once a week. 4.5 mL 5   terazosin (HYTRIN) 5 MG capsule Take 5 mg by mouth daily.     triamcinolone (NASACORT) 55 MCG/ACT AERO nasal inhaler Place 2 sprays into the nose daily. 1 each 12   venlafaxine XR (EFFEXOR-XR) 75 MG 24 hr capsule TAKE 1 CAPSULE BY MOUTH DAILY WITH BREAKFAST. 90 capsule 1   No current facility-administered medications on file prior to visit.   Allergies  Allergen Reactions   Amoxicillin Hives, Itching and Other (See Comments)    White tongue; ? hives   Sulfa Antibiotics Hives and Rash   Terfenadine     ?  reaction   Family History  Problem Relation Age of Onset   Breast cancer Mother 58       breast   Huntington's disease Mother    Stroke Father    Heart disease Father    Hypertension Father    Heart attack Father    Hypertension Brother    Hyperlipidemia Neg Hx    Diabetes Neg Hx    PE: BP 132/78 (BP Location: Left Arm, Patient Position: Sitting, Cuff Size: Normal)   Pulse 81   Ht '5\' 10"'$  (1.778 m)   Wt 174 lb (78.9 kg)   SpO2 97%   BMI 24.97 kg/m  Wt Readings from Last 3 Encounters:  01/14/22 174 lb (78.9 kg)  12/16/21 177 lb 6.4 oz (80.5 kg)  09/10/21 181 lb 3.2 oz (82.2 kg)   Constitutional: Slightly overweight, in NAD Eyes: EOMI, no exophthalmos ENT: moist mucous membranes, no thyromegaly, no cervical lymphadenopathy Cardiovascular: RRR, No MRG, + swelling and pain on palpation in B LE  -wears compression hoses Respiratory: CTA  B Musculoskeletal: no deformities Skin: moist, warm, no rashes Neurological: no tremor with outstretched hands  ASSESSMENT: 1. DM2, non-insulin-dependent, uncontrolled, with complications - PVD - cerebro-vascular ds - s/p "mini"strokes - PN  2. HL  PLAN:  1. Patient with history of uncontrolled diabetes, on metformin, SGLT2 inhibitor, and GLP-1 receptor agonist, with improved control after switching from Januvia to Victoza.  His insurance did not cover CGM despite him having Raynaud's syndrome and having completely reliable fingersticks and despite multiple requests. -At last visit, HbA1c was stable, at goal, at 6.7%.  Sugars are mostly at goal in the morning and he was not checking later in the day.  She was taking Jardiance at bedtime and I advised him to move this before the first meal of the day. - at today's visit, he tells me that he was able to start [name] Cavanaugh since last visit.  He tolerates it well.  He lost 7 pounds since then.  Sugars remain fairly well controlled, with only occasional mild hyperglycemic spikes per review of his meter data.  Fortunately, his insurance does cover his current regimen.  He is wondering whether we can try again a freestyle libre CGM.  I sent this to the pharmacy.  For now, I advised him to continue the current regimen.  I refilled his Jardiance and metformin prescriptions. - I suggested to:  Patient Instructions  Please continue: - Metformin 500 mg 1x a day - Jardiance 25 mg before b'fast - Ozempic  0.5 mg weekly  Please return in 4 months with your sugar log.   - we checked his HbA1c: 6.5% (lower) - advised to check sugars at different times of the day - 1x a day, rotating check times - advised for yearly eye exams >> he is due - needs to schedule - return to clinic in 4 months  2. HL -Reviewed latest lipid panel from 12/2021: LDL slightly higher, above target of less than 55 due to cerebrovascular disease, HDL low: Lab Results  Component Value  Date   CHOL 125 12/16/2021   HDL 28.00 (L) 12/16/2021   LDLCALC 67 12/16/2021   LDLDIRECT 56.0 08/21/2020   TRIG 150.0 (H) 12/16/2021   CHOLHDL 4 12/16/2021  -He continues on pravastatin 40 mg daily without side effects  Philemon Kingdom, MD PhD [facility]

## 2022-01-15 ENCOUNTER — Encounter: Payer: Self-pay | Admitting: Family Medicine

## 2022-01-17 ENCOUNTER — Other Ambulatory Visit: Payer: Self-pay | Admitting: Family Medicine

## 2022-01-18 ENCOUNTER — Other Ambulatory Visit: Payer: Self-pay | Admitting: Family Medicine

## 2022-01-31 ENCOUNTER — Other Ambulatory Visit: Payer: Self-pay | Admitting: Family Medicine

## 2022-01-31 DIAGNOSIS — F322 Major depressive disorder, single episode, severe without psychotic features: Secondary | ICD-10-CM

## 2022-02-02 ENCOUNTER — Other Ambulatory Visit: Payer: Self-pay | Admitting: Family Medicine

## 2022-02-15 ENCOUNTER — Other Ambulatory Visit: Payer: Self-pay | Admitting: Family Medicine

## 2022-02-19 DIAGNOSIS — R194 Change in bowel habit: Secondary | ICD-10-CM | POA: Diagnosis not present

## 2022-02-19 DIAGNOSIS — R1310 Dysphagia, unspecified: Secondary | ICD-10-CM | POA: Diagnosis not present

## 2022-02-19 DIAGNOSIS — R197 Diarrhea, unspecified: Secondary | ICD-10-CM | POA: Diagnosis not present

## 2022-02-19 DIAGNOSIS — R1084 Generalized abdominal pain: Secondary | ICD-10-CM | POA: Diagnosis not present

## 2022-02-19 DIAGNOSIS — K219 Gastro-esophageal reflux disease without esophagitis: Secondary | ICD-10-CM | POA: Diagnosis not present

## 2022-02-19 DIAGNOSIS — R109 Unspecified abdominal pain: Secondary | ICD-10-CM | POA: Diagnosis not present

## 2022-02-19 DIAGNOSIS — K921 Melena: Secondary | ICD-10-CM | POA: Diagnosis not present

## 2022-02-22 ENCOUNTER — Other Ambulatory Visit: Payer: Self-pay | Admitting: Family Medicine

## 2022-02-26 ENCOUNTER — Encounter: Payer: Self-pay | Admitting: Family Medicine

## 2022-02-26 ENCOUNTER — Telehealth: Payer: Medicaid Other | Admitting: Internal Medicine

## 2022-02-26 ENCOUNTER — Encounter: Payer: Self-pay | Admitting: Internal Medicine

## 2022-02-26 ENCOUNTER — Other Ambulatory Visit: Payer: Self-pay | Admitting: Family Medicine

## 2022-02-26 DIAGNOSIS — G8929 Other chronic pain: Secondary | ICD-10-CM

## 2022-02-26 DIAGNOSIS — K649 Unspecified hemorrhoids: Secondary | ICD-10-CM

## 2022-02-27 ENCOUNTER — Other Ambulatory Visit (HOSPITAL_COMMUNITY): Payer: Self-pay

## 2022-02-27 ENCOUNTER — Other Ambulatory Visit: Payer: Self-pay | Admitting: Family Medicine

## 2022-02-27 NOTE — Telephone Encounter (Signed)
Oh, I hope not.Marland KitchenMarland Kitchen

## 2022-02-27 NOTE — Telephone Encounter (Signed)
Per test claim, [facility] thinks the pt has another primary ins, but he does not. I spoke with the pt and he has called them as well as [place] [employer], but will call [facility] again.  Test claim dose say "step therapy is met" so I don't think it really needs a PA.

## 2022-02-28 ENCOUNTER — Other Ambulatory Visit (HOSPITAL_COMMUNITY): Payer: Self-pay

## 2022-03-03 ENCOUNTER — Other Ambulatory Visit (HOSPITAL_COMMUNITY): Payer: Self-pay

## 2022-03-10 DIAGNOSIS — N138 Other obstructive and reflux uropathy: Secondary | ICD-10-CM | POA: Diagnosis not present

## 2022-03-10 DIAGNOSIS — N401 Enlarged prostate with lower urinary tract symptoms: Secondary | ICD-10-CM | POA: Diagnosis not present

## 2022-03-10 DIAGNOSIS — N411 Chronic prostatitis: Secondary | ICD-10-CM | POA: Diagnosis not present

## 2022-03-10 DIAGNOSIS — R3912 Poor urinary stream: Secondary | ICD-10-CM | POA: Diagnosis not present

## 2022-03-10 DIAGNOSIS — R39198 Other difficulties with micturition: Secondary | ICD-10-CM

## 2022-03-10 DIAGNOSIS — N312 Flaccid neuropathic bladder, not elsewhere classified: Secondary | ICD-10-CM

## 2022-03-10 HISTORY — DX: Other difficulties with micturition: R39.198

## 2022-03-10 HISTORY — DX: Flaccid neuropathic bladder, not elsewhere classified: N31.2

## 2022-04-13 ENCOUNTER — Other Ambulatory Visit: Payer: Self-pay | Admitting: Family Medicine

## 2022-04-14 ENCOUNTER — Other Ambulatory Visit: Payer: Self-pay | Admitting: Family Medicine

## 2022-04-18 ENCOUNTER — Other Ambulatory Visit: Payer: Self-pay | Admitting: Family Medicine

## 2022-04-18 DIAGNOSIS — I10 Essential (primary) hypertension: Secondary | ICD-10-CM

## 2022-04-24 ENCOUNTER — Other Ambulatory Visit (HOSPITAL_BASED_OUTPATIENT_CLINIC_OR_DEPARTMENT_OTHER): Payer: Self-pay

## 2022-04-24 DIAGNOSIS — N401 Enlarged prostate with lower urinary tract symptoms: Secondary | ICD-10-CM | POA: Diagnosis not present

## 2022-04-24 DIAGNOSIS — N312 Flaccid neuropathic bladder, not elsewhere classified: Secondary | ICD-10-CM | POA: Diagnosis not present

## 2022-04-24 DIAGNOSIS — R3912 Poor urinary stream: Secondary | ICD-10-CM | POA: Diagnosis not present

## 2022-04-24 DIAGNOSIS — N138 Other obstructive and reflux uropathy: Secondary | ICD-10-CM | POA: Diagnosis not present

## 2022-04-24 DIAGNOSIS — R39198 Other difficulties with micturition: Secondary | ICD-10-CM | POA: Diagnosis not present

## 2022-04-30 DIAGNOSIS — N138 Other obstructive and reflux uropathy: Secondary | ICD-10-CM | POA: Diagnosis not present

## 2022-04-30 DIAGNOSIS — N401 Enlarged prostate with lower urinary tract symptoms: Secondary | ICD-10-CM | POA: Diagnosis not present

## 2022-04-30 DIAGNOSIS — R3912 Poor urinary stream: Secondary | ICD-10-CM | POA: Diagnosis not present

## 2022-04-30 DIAGNOSIS — R338 Other retention of urine: Secondary | ICD-10-CM | POA: Diagnosis not present

## 2022-05-13 DIAGNOSIS — R82998 Other abnormal findings in urine: Secondary | ICD-10-CM | POA: Diagnosis not present

## 2022-05-13 DIAGNOSIS — R338 Other retention of urine: Secondary | ICD-10-CM | POA: Diagnosis not present

## 2022-05-13 DIAGNOSIS — R339 Retention of urine, unspecified: Secondary | ICD-10-CM | POA: Diagnosis not present

## 2022-05-19 DIAGNOSIS — N390 Urinary tract infection, site not specified: Secondary | ICD-10-CM | POA: Insufficient documentation

## 2022-05-19 HISTORY — DX: Urinary tract infection, site not specified: N39.0

## 2022-05-20 ENCOUNTER — Encounter: Payer: Self-pay | Admitting: Internal Medicine

## 2022-05-20 ENCOUNTER — Ambulatory Visit (INDEPENDENT_AMBULATORY_CARE_PROVIDER_SITE_OTHER): Payer: Medicaid Other | Admitting: Internal Medicine

## 2022-05-20 VITALS — BP 122/82 | HR 95 | Ht 70.0 in | Wt 176.4 lb

## 2022-05-20 DIAGNOSIS — E785 Hyperlipidemia, unspecified: Secondary | ICD-10-CM | POA: Diagnosis not present

## 2022-05-20 DIAGNOSIS — E1165 Type 2 diabetes mellitus with hyperglycemia: Secondary | ICD-10-CM

## 2022-05-20 DIAGNOSIS — E1159 Type 2 diabetes mellitus with other circulatory complications: Secondary | ICD-10-CM | POA: Diagnosis not present

## 2022-05-20 LAB — POCT GLYCOSYLATED HEMOGLOBIN (HGB A1C): Hemoglobin A1C: 8.2 % — AB (ref 4.0–5.6)

## 2022-05-20 MED ORDER — ACCU-CHEK SOFTCLIX LANCETS MISC: 99 refills | Status: AC

## 2022-05-20 MED ORDER — ACCU-CHEK GUIDE VI STRP: ORAL_STRIP | 99 refills | Status: AC

## 2022-05-20 NOTE — Progress Notes (Signed)
Patient ID: Chase [name], male   DOB: 1960/07/19, 61 y.o.   MRN: [id number]  HPI: Chase [name] is a 61 y.o.-year-old male, presenting for follow-up for DM2, dx in 2014, non-insulin-dependent, uncontrolled, with complications (PVD, cerebro-vascular ds - s/p "mini"strokes, PN). Last visit 4 months ago. He had [employer], now [place] only.  Interim history: He has blurry vision and increased urination.  He is on Tamsulozin (changed from terazosin) and finasteride. He is also on ABx for prostatitis + UTI >> initially Ciprofloxacin, now Nitrofurantoin.  He continues to have memory loss and difficulty word finding. He has mm aches, dry mouth, sore throat. He has been drinking more apple juice since last visit -as plain water bothers him to have indigestion.  HbA1c levels reviewed: Lab Results  Component Value Date   HGBA1C 6.5 (A) 01/14/2022   HGBA1C 6.7 (A) 09/10/2021   HGBA1C 6.7 (A) 06/03/2021   HGBA1C 6.2 (A) 01/03/2021   HGBA1C 6.7 (A) 09/10/2020   HGBA1C 8.2 (H) 05/30/2020   HGBA1C 8.3 (H) 05/24/2020   HGBA1C 9.7 (H) 04/05/2020   HGBA1C 8.3 (H) 05/24/2019   HGBA1C 7.9 (A) 08/04/2018   HGBA1C 7.5 (A) 12/21/2017   HGBA1C 7.4 09/17/2017   HGBA1C 7.8 06/19/2017   HGBA1C 7.1 (H) 02/19/2017   HGBA1C 7.4 02/21/2016   HGBA1C 9.8 (H) 11/13/2015   HGBA1C 6.8 (H) 02/11/2015   HGBA1C 6.8 (H) 10/03/2014   HGBA1C 6.6 (H) 05/26/2014   HGBA1C 6.4 06/03/2013   Pt is on a regimen of:  - Metformin 500 mg 1x a day >> 2x >> 1x a day b/c problems with swallowing - Jardiance 25 mg at night >> before breakfast - Januvia 100 mg in am >> Victoza 1.8 mg weekly-started 05/2020 >> Ozempic 0.5 mg weekly In 11/2018, we tried to change from Januvia to [place] but this was not covered. Stopped Glipizide b/c rash. Stopped Invokana 100 mg in am >> only got this for 1 mo He was on Metformin 500 mg po bid - in liquid form, as he could not swallow pills. He gets gi upset. He stopped this in  2014.  He is not checking sugars-fingerstick values are not accurate due to Raynaud's syndrome, however, freestyle libre CGM was not approved by his insurance.  He checks sugars approximately 1-2x a day: - brunch:  115-146 >> 107-134, 147, 160 >> 123, 124-138 >> 139-181, 204, 210 - 2h after brunch: 147, 154, 217 >> 137-151 >> N/c >> 99-135, 145 >> 204-214 - before dinner: 150-200 >> n/c >> 115 >> 109, 149 >> n/c  - 2h after dinner:  180-210 >> 260 >> n/c >> 163, 183 >> n/c - bedtime: 208 >> ? >> 164, 173 >> 121 >> n/c >> 145 >> n/c - nighttime: n/c >> 108-138, 151 >> n/c Lowest sugar was 108 >> 115 >> 107 >> 80s;  he has hypoglycemia awareness in the 70s. Highest sugar was 261 (Prednisone) >> 183 >> 214.  Glucometer: [employer] Next >> AccuChek guide  In the past, he was occasional lemonade and ginger ale >> advised to stop - still drinking these but rarely.  -+ Mild CKD, last BUN/creatinine:  Lab Results  Component Value Date   BUN 8 12/16/2021   BUN 8 06/28/2021   CREATININE 0.89 12/16/2021   CREATININE 0.92 06/28/2021   -+ HL.  last set of lipids: Lab Results  Component Value Date   CHOL 125 12/16/2021   HDL 28.00 (L) 12/16/2021  LDLCALC 67 12/16/2021   LDLDIRECT 56.0 08/21/2020   TRIG 150.0 (H) 12/16/2021   CHOLHDL 4 12/16/2021  On pravastatin 40.  - last eye exam was in 2022: No DR reportedly.  Dr. Jabier [name].  - He has numbness and tingling in his feet, also pain.  Foot exam performed 09/10/2021.  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 Has varicose veins >> wears compresion hoses. He also has a h/o kidney stones.  On allopurinol for gout. He has anxiety, PTSD from childhood.  Early in 2022, he was diagnosed with an intradural extramedullary spinal tumor.  It did not change in appearance on a recent MRI.  ROS: + See HPI.  Past Medical History:  Diagnosis Date   Abnormality of gait 11/10/2012   Acute  GI bleeding    Acute renal insufficiency 02/10/2015   Acute upper respiratory infection 08/10/2012   Allergic drug reaction 05/24/2020   Allergic rhinitis 10/16/2020   Allergy    Anemia 02/10/2015   Annual physical exam 11/26/2011   ANXIETY DEPRESSION 12/09/2007   Qualifier: Diagnosis of  By: Chase [name]    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 10/17/2014   Arthralgia 07/25/2010   Arthritis    Benign neoplasm of brain ([place]) 05/24/2019   Bigeminy ? 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 ([place]) 03/12/2018   Cerebrovascular accident, old 11/16/2014   Chest discomfort 06/08/2019   Chronic back pain 11/28/2020   Chronic cough 08/27/2014   CXR 08/2014:  Mild hyperinflation, no acute process Arlyce Harman 09/2014:  Difficulty with procedure, but essentially normal +CVA's in past, with aspiration risk by history Speech evaluation 2016:      Chronic left shoulder pain 08/06/2020   Chronic pain syndrome 11/23/2019   Chronic pansinusitis 06/30/2018   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   Constipation    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   DDD (degenerative disc disease), cervical 04/26/2019   DDD (degenerative disc disease), lumbar 04/26/2019   Decreased hearing of both ears 02/12/2013   Deviated septum 05/31/2018   Diabetes mellitus without complication ([place])    Difficulty urinating    DM (diabetes mellitus) type II uncontrolled, periph vascular disorder 01/02/2016   Dry skin 05/09/2019   Dysuria 11/29/2010   Essential (primary) hypertension 01/26/2015   Excessive daytime sleepiness 02/11/2018   FATIGUE 12/09/2007   Qualifier: Diagnosis of  By: Chase [name]    Fibromyalgia  03/02/2017   Food intolerance 05/09/2019   GERD 12/09/2007   Qualifier: Diagnosis of  By: Chase [name]    GERD (gastroesophageal reflux disease)    GI bleed 02/10/2015   Headache(784.0)    Hearing loss    Hemangioma 02/12/2013   Hemorrhoid 05/22/2011   HTN (hypertension) 02/04/2010   Qualifier: Diagnosis of  By: Charlett Blake MD, Stacey     Hyperlipidemia    Hyperlipidemia associated with type 2 diabetes mellitus ([place]) 11/22/2020   Hyperlipidemia LDL goal <70 01/26/2015   Hypersomnia, persistent 11/05/2012   epworth of 18 points, was placed on CPAP in 2008 with very low AHI ( no number  Mentioned) and struggles ever since with PAP. Marland Kitchen     Hypertension    Hypokalemia 08/29/2020   Hypotension 07/04/2020   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/23/2019   Insect bite 03/16/2018   Insomnia 08/05/2011   Internal and external hemorrhoids without complication 53/97/6734   Internal hemorrhoids 10/22/2014   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   Leg swelling    Localized swelling of both lower legs 03/02/2017   Low back pain 07/20/2007   Qualifier: Diagnosis of  By: Chase [name]    Lower extremity edema 07/04/2020   Lower GI bleed    Mixed conductive and sensorineural hearing loss of right ear with restricted hearing of left ear 10/16/2020   Nasal congestion    Neck pain 03/15/2020   NEPHROLITHIASIS 08/13/2006   Qualifier: Diagnosis of  By: Beryle Lathe     Nocturia more than twice per night 02/11/2018   OSA on CPAP 10/17/2014   Otalgia of both ears 06/06/2021   Other chronic pain 03/02/2017   Other fatigue 07/04/2020   Pain in finger of right hand 10/06/2012   Pain in joint of right shoulder 02/21/2017   Palpitations 01/26/2015   Paresthesia 07/25/2010   Persistent atrial fibrillation ([place]) 08/29/2020   Persistent headaches 10/17/2014   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   Qualifier: Diagnosis of  By: Chase Hummer MD, Izell [place] R     PONV (postoperative nausea and vomiting)    Preoperative clearance 07/04/2020   Prolapsed hemorrhoids 10/05/2013   Radiculopathy, lumbar region 03/15/2020   Rash 05/24/2019   Raynaud's disease 03/02/2017   Raynaud's phenomenon 03/02/2017   Rectal bleeding 02/12/2015   Rectal pain    Rectal ulcer with bleeding after hemorrhoid banding 02/11/2015   Rhinitis, chronic 12/23/2006   Qualifier: Diagnosis of  By: Chase Razor RN, Regina G    Right shoulder pain 02/15/2015   Seasonal and perennial allergic rhinitis 05/09/2019   Severe episode of recurrent major depressive disorder, without psychotic features ([place]) 08/29/2020   Sinus infection 06/30/2014   Sinusitis, acute, maxillary 02/02/2014   Sleep apnea    Sleep apnea with use of continuous positive airway pressure (CPAP) 02/11/2018   SOB (shortness of breath) 07/04/2020   Stroke ([place])    Stroke ([place])    Subacute confusional state 10/17/2014   SYMPTOM, HYPERSOMNIA NOS 02/19/2007   Qualifier: Diagnosis of  By: Tilden Dome     Thrombosed hemorrhoids    Tick bite of back 11/23/2019   Tinnitus of both ears 08/29/2020   Trouble swallowing    Unspecified hemorrhoids 02/10/2015   Unspecified sleep apnea 11/05/2012   Varicose veins of lower extremities with other complications 19/37/9024   Vertigo 03/19/2012   Past Surgical History:  Procedure Laterality Date   CATARACT EXTRACTION     x 3   EYE SURGERY  1968, 1985, 1987   cataracts   FLEXIBLE SIGMOIDOSCOPY N/A 02/11/2015   Procedure: FLEXIBLE SIGMOIDOSCOPY;  Surgeon: Gatha Mayer, MD;  Location: [facility];  Service: Endoscopy;  Laterality: N/A;   INNER EAR SURGERY     rt ear   INNER EAR SURGERY  1992   surgery - right arm     1983   Social History   Socioeconomic History   Marital status: Single    Spouse name: Not on file   Number of children: 0   Years of education: Not on file   Highest education level: Not on file  Occupational History   Not on  file  Tobacco Use    Smoking status: Never   Smokeless tobacco: Never  Vaping Use   Vaping Use: Never used  Substance and Sexual Activity   Alcohol use: No    Alcohol/week: 0.0 standard drinks of alcohol    Comment: none   Drug use: No   Sexual activity: Never  Other Topics Concern   Not on file  Social History Narrative   No caffeine intake except for chocolate.  Exercised-walking   Social Determinants of Radio broadcast assistant Strain: Not on file  Food Insecurity: Not on file  Transportation Needs: Not on file  Physical Activity: Not on file  Stress: Not on file  Social Connections: Not on file  Intimate Partner Violence: Not on file   Current Outpatient Medications on File Prior to Visit  Medication Sig Dispense Refill   allopurinol (ZYLOPRIM) 100 MG tablet TAKE 1 TABLET BY MOUTH TWICE A DAY 180 tablet 1   amLODipine (NORVASC) 2.5 MG tablet Take 1 tablet (2.5 mg total) by mouth daily. 90 tablet 2   Azelastine HCl (ASTEPRO) 0.15 % SOLN Place 1 spray into the nose in the morning and at bedtime. 1 mL 3   BD PEN NEEDLE NANO 2ND GEN 32G X 4 MM MISC USE ONCE DAILY AS DIRECTED 100 each 3   clotrimazole-betamethasone (LOTRISONE) cream Apply 1 application topically as needed for rash.     Continuous Blood Gluc Receiver (FREESTYLE LIBRE 2 READER) DEVI 1 each by Does not apply route daily. 1 each 0   Continuous Blood Gluc Sensor (FREESTYLE LIBRE 2 SENSOR) MISC 1 each by Does not apply route every 14 (fourteen) days. 6 each 3   cyclobenzaprine (FLEXERIL) 10 MG tablet TAKE 1 TABLET BY MOUTH TWICE A DAY AS NEEDED FOR MUSCLE SPASM 60 tablet 1   EPINEPHrine 0.3 mg/0.3 mL IJ SOAJ injection Inject 0.3 mg into the muscle as needed for anaphylaxis. 1 each 0   famotidine (PEPCID) 40 MG/5ML suspension Take 2.5 mLs by mouth daily.     FIBER SELECT GUMMIES PO Take 1 tablet by mouth daily.     fluticasone (CUTIVATE) 0.05 % cream Apply 1 application topically 2 (two) times daily.     gabapentin (NEURONTIN) 100 MG  capsule TAKE 1 CAPSULE BY MOUTH THREE TIMES A DAY 90 capsule 3   GAVILAX 17 GM/SCOOP powder TAKE 17 G BY MOUTH DAILY AS NEEDED. 238 g 5   glucose blood test strip Use as instructed to check blood sugar 1 daily. 100 each 3   hydrocortisone (ANUSOL-HC) 2.5 % rectal cream PLACE 1 APPLICATION RECTALLY 2 (TWO) TIMES DAILY. 30 g 2   hydrOXYzine (ATARAX) 25 MG tablet TAKE 1 TABLET (25 MG TOTAL) BY MOUTH EVERY 8 (EIGHT) HOURS AS NEEDED FOR ANXIETY OR ITCHING. 270 tablet 2   JARDIANCE 25 MG TABS tablet TAKE 1 TABLET (25 MG TOTAL) BY MOUTH DAILY. 90 tablet 3   losartan (COZAAR) 100 MG tablet TAKE 1 TABLET BY MOUTH EVERY DAY 90 tablet 1   metFORMIN (GLUCOPHAGE) 500 MG tablet Take 1 tablet (500 mg total) by mouth 2 (two) times daily with a meal. 180 tablet 3   metoprolol tartrate (LOPRESSOR) 50 MG tablet Take 1 tablet (50 mg total) by mouth daily. 90 tablet 1   pravastatin (PRAVACHOL) 20 MG tablet TAKE 2 TABLETS (40 MG TOTAL) BY MOUTH DAILY. 180 tablet 1   Semaglutide,0.25 or 0.'5MG'$ /DOS, (OZEMPIC, 0.25 OR 0.5 MG/DOSE,) 2 MG/1.5ML SOPN Inject 0.5 mg into the  skin once a week. 4.5 mL 5   terazosin (HYTRIN) 5 MG capsule Take 5 mg by mouth daily.     triamcinolone (NASACORT) 55 MCG/ACT AERO nasal inhaler Place 2 sprays into the nose daily. 1 each 12   venlafaxine XR (EFFEXOR-XR) 75 MG 24 hr capsule TAKE 1 CAPSULE BY MOUTH DAILY WITH BREAKFAST. 90 capsule 1   No current facility-administered medications on file prior to visit.   Allergies  Allergen Reactions   Amoxicillin Hives, Itching and Other (See Comments)    White tongue; ? hives   Sulfa Antibiotics Hives and Rash   Terfenadine     ? reaction   Family History  Problem Relation Age of Onset   Breast cancer Mother 74       breast   Huntington's disease Mother    Stroke Father    Heart disease Father    Hypertension Father    Heart attack Father    Hypertension Brother    Hyperlipidemia Neg Hx    Diabetes Neg Hx    PE: BP 122/82 (BP Location:  Left Arm, Patient Position: Sitting, Cuff Size: Normal)   Pulse 95   Ht '5\' 10"'$  (1.778 m)   Wt 176 lb 6.4 oz (80 kg)   SpO2 98%   BMI 25.31 kg/m  Wt Readings from Last 3 Encounters:  05/20/22 176 lb 6.4 oz (80 kg)  01/14/22 174 lb (78.9 kg)  12/16/21 177 lb 6.4 oz (80.5 kg)   Constitutional: Slightly overweight, in NAD Eyes: EOMI, no exophthalmos ENT: mno thyromegaly, no cervical lymphadenopathy Cardiovascular: tachycardia, RR, No MRG, + swelling and pain on palpation in B LE  -wears compression hoses Respiratory: CTA B Musculoskeletal: no deformities Skin: no rashes Neurological: no tremor with outstretched hands  ASSESSMENT: 1. DM2, non-insulin-dependent, uncontrolled, with complications - PVD - cerebro-vascular ds - s/p "mini"strokes - PN  2. HL  PLAN:  1. Patient with history of uncontrolled diabetes, metformin, SGLT 2 inhibitor and GLP-1 receptor agonist, with improved control after switching from Januvia to Victoza.  Afterwards, we were able to switch from Victoza to Peridot with further improvement in control.  He also lost weight afterwards.  At last visit, HbA1c was excellent, at 6.5%, improved.  We did not change his regimen at that time. -His insurance did not cover CGM despite him having Raynaud's syndrome and having completely reliable fingersticks and despite multiple requests.  At last visit I tried again to send a prescription for this to his pharmacy >> was not able to get it.  -At today's visit, he is not checking sugars frequently, but whenever he checks, they are mostly above target, even in the 200s. -We discussed about the absolute importance of stopping juice or any other sweet drinks. -He also mentions that he feels that sugars worsened after he moved to [place] before breakfast.  I advised him to try to again to take it at bedtime, but if the sugars do not improve, to let me know, so I can call in a higher dose of Ozempic to his pharmacy.  However, we have  to be careful with Ozempic due to indigestion, which he describes when drinking water. - I suggested to:  Patient Instructions  Please continue: - Metformin 500 mg 1x a day - Jardiance 25 mg before b'fast (can try again to move it at night) - Ozempic  0.5 mg weekly  STOP SWEET DRINKS!  Please return in 4 months with your sugar log.   - we  checked his HbA1c: 8.2% (higher) - advised to check sugars at different times of the day - 4x a day, rotating check times - advised for yearly eye exams >> he is UTD - return to clinic in 4 months  2. HL -Reviewed latest lipid panel from 12/2021: LDL slightly higher than our goal of less than 55 due to cardiovascular disease, HDL low: Lab Results  Component Value Date   CHOL 125 12/16/2021   HDL 28.00 (L) 12/16/2021   LDLCALC 67 12/16/2021   LDLDIRECT 56.0 08/21/2020   TRIG 150.0 (H) 12/16/2021   CHOLHDL 4 12/16/2021  -He continues on pravastatin 40 mg daily without side effects  Philemon Kingdom, MD PhD [facility] Endocrinology

## 2022-05-20 NOTE — Patient Instructions (Addendum)
Please continue: - Metformin 500 mg 1x a day - Jardiance 25 mg before b'fast (can try again to move it at night) - Ozempic  0.5 mg weekly  STOP SWEET DRINKS!  Please return in 4 months with your sugar log.

## 2022-05-24 ENCOUNTER — Other Ambulatory Visit: Payer: Self-pay | Admitting: Family Medicine

## 2022-05-29 ENCOUNTER — Encounter: Payer: Self-pay | Admitting: Family Medicine

## 2022-05-30 ENCOUNTER — Telehealth (INDEPENDENT_AMBULATORY_CARE_PROVIDER_SITE_OTHER): Payer: Medicaid Other | Admitting: Family Medicine

## 2022-05-30 ENCOUNTER — Encounter: Payer: Self-pay | Admitting: Family Medicine

## 2022-05-30 DIAGNOSIS — U071 COVID-19: Secondary | ICD-10-CM | POA: Diagnosis not present

## 2022-05-30 MED ORDER — MOLNUPIRAVIR EUA 200MG CAPSULE: 4.0000 | ORAL_CAPSULE | Freq: Two times a day (BID) | ORAL | 0 refills | Status: AC

## 2022-05-30 MED ORDER — PROMETHAZINE-DM 6.25-15 MG/5ML PO SYRP: 5.0000 mL | ORAL_SOLUTION | Freq: Four times a day (QID) | ORAL | 0 refills | Status: DC | PRN

## 2022-05-30 NOTE — Progress Notes (Unsigned)
MyChart Video Visit    Virtual Visit via Video Note   This visit type was conducted due to national recommendations for restrictions regarding the COVID-19 Pandemic (e.g. social distancing) in an effort to limit this patient's exposure and mitigate transmission in our community. This patient is at least at moderate risk for complications without adequate follow up. This format is felt to be most appropriate for this patient at this time. Physical exam was limited by quality of the video and audio technology used for the visit. Nira Conn was able to get the patient set up on a video visit.  Patient location: home Patient and provider in visit Provider location: Office  I discussed the limitations of evaluation and management by telemedicine and the availability of in person appointments. The patient expressed understanding and agreed to proceed.  Visit Date: 05/30/2022  Today's healthcare provider: Ann Held, DO     Subjective:    Patient ID: Chase [name], male    DOB: 02-23-1961, 61 y.o.   MRN: [id number]  Chief Complaint  Patient presents with   Covid Positive    HPI Patient is in today for congestion and + covid on Wednesday.  His symptoms started wed as well ---  his roommate tested + on Tuesday.  Fever 103 yesterday and cough , productive + body aches.   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 ([place]) 03/12/2018   Cerebrovascular accident, old 11/16/2014   Chest discomfort 06/08/2019   Chronic back pain 11/28/2020   Chronic cough 08/27/2014   CXR 08/2014:  Mild hyperinflation, no acute process Arlyce Harman 09/2014:  Difficulty with procedure, but essentially normal +CVA's in past, with aspiration risk by history Speech evaluation 2016:      Chronic left shoulder pain 08/06/2020   Chronic pain syndrome 11/23/2019   Chronic pansinusitis 06/30/2018   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   Constipation    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   DDD (degenerative disc disease), cervical 04/26/2019   DDD (degenerative disc disease), lumbar 04/26/2019   Decreased hearing of both ears 02/12/2013   Deviated septum 05/31/2018   Diabetes mellitus without complication ([place])    Difficulty urinating    DM (diabetes mellitus) type II uncontrolled, periph vascular disorder 01/02/2016   Dry skin 05/09/2019   Dysuria 11/29/2010   Essential (primary) hypertension 01/26/2015   Excessive daytime sleepiness 02/11/2018   FATIGUE 12/09/2007   Qualifier: Diagnosis of  By: Aurther Loft    Fibromyalgia  03/02/2017   Food intolerance 05/09/2019   GERD 12/09/2007   Qualifier: Diagnosis of  By: Niel Hummer MD, Lorinda Creed    GERD (gastroesophageal reflux disease)    GI bleed 02/10/2015   Headache(784.0)    Hearing loss    Hemangioma 02/12/2013   Hemorrhoid 05/22/2011   HTN (hypertension) 02/04/2010   Qualifier: Diagnosis of  By: Charlett Blake MD, Stacey     Hyperlipidemia    Hyperlipidemia associated with type 2 diabetes mellitus ([place]) 11/22/2020   Hyperlipidemia LDL  goal <70 01/26/2015   Hypersomnia, persistent 11/05/2012   epworth of 18 points, was placed on CPAP in 2008 with very low AHI ( no number  Mentioned) and struggles ever since with PAP. 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   Leg swelling    Localized swelling of both lower legs 03/02/2017   Low back pain 07/20/2007   Qualifier: Diagnosis of  By: Niel Hummer MD, Lorinda Creed    Lower extremity edema 07/04/2020   Lower GI bleed    Mixed conductive and sensorineural hearing loss of right ear with restricted hearing of left ear 10/16/2020   Nasal congestion    Neck pain 03/15/2020   NEPHROLITHIASIS 08/13/2006   Qualifier: Diagnosis of  By: Beryle Lathe     Nocturia more than twice per night 02/11/2018   OSA on CPAP 10/17/2014   Otalgia of both ears 06/06/2021   Other chronic pain 03/02/2017   Other fatigue 07/04/2020   Pain in finger of right hand 10/06/2012   Pain in joint of right shoulder 02/21/2017   Palpitations 01/26/2015   Paresthesia 07/25/2010   Persistent atrial fibrillation ([place]) 08/29/2020   Persistent headaches 10/17/2014   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   Qualifier: Diagnosis of  By: Niel Hummer MD, Izell [place] R    PONV (postoperative nausea and vomiting)    Preoperative clearance 07/04/2020   Prolapsed hemorrhoids 10/05/2013   Radiculopathy, lumbar region 03/15/2020   Rash 05/24/2019   Raynaud's disease 03/02/2017   Raynaud's phenomenon 03/02/2017   Rectal bleeding 02/12/2015   Rectal pain    Rectal ulcer with bleeding after hemorrhoid banding 02/11/2015   Rhinitis, chronic 12/23/2006   Qualifier: Diagnosis of  By: Cori Razor RN, Regina G    Right shoulder pain 02/15/2015    Seasonal and perennial allergic rhinitis 05/09/2019   Severe episode of recurrent major depressive disorder, without psychotic features ([place]) 08/29/2020   Sinus infection 06/30/2014   Sinusitis, acute, maxillary 02/02/2014   Sleep apnea    Sleep apnea with use of continuous positive airway pressure (CPAP) 02/11/2018   SOB (shortness of breath) 07/04/2020   Stroke ([place])    Stroke ([place])    Subacute confusional state 10/17/2014   SYMPTOM, HYPERSOMNIA NOS 02/19/2007   Qualifier: Diagnosis of  By: Tilden Dome     Thrombosed hemorrhoids    Tick bite of back 11/23/2019   Tinnitus of both ears 08/29/2020   Trouble swallowing    Unspecified hemorrhoids 02/10/2015   Unspecified sleep apnea 11/05/2012   Varicose veins of lower extremities with other complications 25/42/7062   Vertigo 03/19/2012    Past Surgical History:  Procedure Laterality Date   CATARACT EXTRACTION     x  [place] N/A 02/11/2015   Procedure: FLEXIBLE SIGMOIDOSCOPY;  Surgeon: Gatha Mayer, MD;  Location: [facility];  Service: Endoscopy;  Laterality: N/A;   INNER EAR SURGERY     rt ear   INNER EAR SURGERY  1992   surgery - right arm     1983    Family History  Problem Relation Age of Onset   Breast cancer Mother 66       breast   Huntington's disease Mother    Stroke Father    Heart disease Father    Hypertension Father    Heart attack Father    Hypertension Brother    Hyperlipidemia Neg Hx    Diabetes Neg Hx     Social History   Socioeconomic History   Marital status: Single    Spouse name: Not on file   Number of children: 0   Years of education: Not on file   Highest education level: Not on file  Occupational History   Not on file  Tobacco Use   Smoking status: Never   Smokeless tobacco: Never  Vaping Use   Vaping Use: Never used  Substance and Sexual Activity   Alcohol use: No    Alcohol/week: 0.0 standard drinks of  alcohol    Comment: none   Drug use: No   Sexual activity: Never  Other Topics Concern   Not on file  Social History Narrative   No caffeine intake except for chocolate.  Exercised-walking   Social Determinants of Radio broadcast assistant Strain: Not on file  Food Insecurity: Not on file  Transportation Needs: Not on file  Physical Activity: Not on file  Stress: Not on file  Social Connections: Not on file  Intimate Partner Violence: Not on file    Outpatient Medications Prior to Visit  Medication Sig Dispense Refill   Accu-Chek Softclix Lancets lancets Use as instructed 1x a day 100 each PRN   allopurinol (ZYLOPRIM) 100 MG tablet TAKE 1 TABLET BY MOUTH TWICE A DAY 180 tablet 1   amLODipine (NORVASC) 2.5 MG tablet Take 1 tablet (2.5 mg total) by mouth daily. 90 tablet 2   Azelastine HCl (ASTEPRO) 0.15 % SOLN Place 1 spray into the nose in the morning and at bedtime. 1 mL 3   BD PEN NEEDLE NANO 2ND GEN 32G X 4 MM MISC USE ONCE DAILY AS DIRECTED 100 each 3   clotrimazole-betamethasone (LOTRISONE) cream Apply 1 application topically as needed for rash.     Continuous Blood Gluc Receiver (FREESTYLE LIBRE 2 READER) DEVI 1 each by Does not apply route daily. 1 each 0   Continuous Blood Gluc Sensor (FREESTYLE LIBRE 2 SENSOR) MISC 1 each by Does not apply route every 14 (fourteen) days. 6 each 3   cyclobenzaprine (FLEXERIL) 10 MG tablet TAKE 1 TABLET BY MOUTH TWICE A DAY AS NEEDED FOR MUSCLE SPASMS 42 tablet 2   EPINEPHrine 0.3 mg/0.3 mL IJ SOAJ injection Inject 0.3 mg into the muscle as needed for anaphylaxis. 1 each 0   famotidine (PEPCID) 40 MG/5ML suspension Take 2.5 mLs by mouth daily.     FIBER SELECT GUMMIES PO Take 1 tablet by mouth daily.     fluticasone (CUTIVATE) 0.05 % cream Apply 1 application topically 2 (two) times daily.     gabapentin (NEURONTIN) 100 MG capsule TAKE 1 CAPSULE BY MOUTH THREE TIMES A DAY 90 capsule 3  GAVILAX 17 GM/SCOOP powder TAKE 17 G BY MOUTH DAILY  AS NEEDED. 238 g 5   glucose blood (ACCU-CHEK GUIDE) test strip Use as instructed 1x a day - 100 each PRN   hydrocortisone (ANUSOL-HC) 2.5 % rectal cream PLACE 1 APPLICATION RECTALLY 2 (TWO) TIMES DAILY. 30 g 2   hydrOXYzine (ATARAX) 25 MG tablet TAKE 1 TABLET (25 MG TOTAL) BY MOUTH EVERY 8 (EIGHT) HOURS AS NEEDED FOR ANXIETY OR ITCHING. 270 tablet 2   JARDIANCE 25 MG TABS tablet TAKE 1 TABLET (25 MG TOTAL) BY MOUTH DAILY. 90 tablet 3   losartan (COZAAR) 100 MG tablet TAKE 1 TABLET BY MOUTH EVERY DAY 90 tablet 1   metFORMIN (GLUCOPHAGE) 500 MG tablet Take 1 tablet (500 mg total) by mouth 2 (two) times daily with a meal. 180 tablet 3   metoprolol tartrate (LOPRESSOR) 50 MG tablet Take 1 tablet (50 mg total) by mouth daily. 90 tablet 1   pravastatin (PRAVACHOL) 20 MG tablet TAKE 2 TABLETS (40 MG TOTAL) BY MOUTH DAILY. 180 tablet 1   Semaglutide,0.25 or 0.'5MG'$ /DOS, (OZEMPIC, 0.25 OR 0.5 MG/DOSE,) 2 MG/1.5ML SOPN Inject 0.5 mg into the skin once a week. 4.5 mL 5   terazosin (HYTRIN) 5 MG capsule Take 5 mg by mouth daily.     triamcinolone (NASACORT) 55 MCG/ACT AERO nasal inhaler Place 2 sprays into the nose daily. 1 each 12   venlafaxine XR (EFFEXOR-XR) 75 MG 24 hr capsule TAKE 1 CAPSULE BY MOUTH DAILY WITH BREAKFAST. 90 capsule 1   No facility-administered medications prior to visit.    Allergies  Allergen Reactions   Amoxicillin Hives, Itching and Other (See Comments)    White tongue; ? hives   Sulfa Antibiotics Hives and Rash   Terfenadine     ? reaction    Review of Systems  Constitutional:  Positive for chills and fever. Negative for malaise/fatigue.  HENT:  Positive for congestion.   Eyes:  Negative for blurred vision.  Respiratory:  Positive for cough and sputum production. Negative for shortness of breath.   Cardiovascular:  Negative for chest pain, palpitations and leg swelling.  Gastrointestinal:  Negative for vomiting.  Musculoskeletal:  Negative for back pain.  Skin:   Negative for rash.  Neurological:  Negative for loss of consciousness and headaches.       Objective:    Physical Exam Vitals and nursing note reviewed.  Pulmonary:     Effort: Pulmonary effort is normal.  Psychiatric:        Mood and Affect: Mood normal.        Behavior: Behavior normal.        Thought Content: Thought content normal.        Judgment: Judgment normal.     There were no vitals taken for this visit. Wt Readings from Last 3 Encounters:  05/20/22 176 lb 6.4 oz (80 kg)  01/14/22 174 lb (78.9 kg)  12/16/21 177 lb 6.4 oz (80.5 kg)       Assessment & Plan:  COVID-19 -     molnupiravir EUA; Take 4 capsules (800 mg total) by mouth 2 (two) times daily for 5 days.  Dispense: 40 capsule; Refill: 0 -     Promethazine-DM; Take 5 mLs by mouth 4 (four) times daily as needed.  Dispense: 118 mL; Refill: 0     I discussed the assessment and treatment plan with the patient. The patient was provided an opportunity to ask questions and all were answered. The patient  agreed with the plan and demonstrated an understanding of the instructions.   The patient was advised to call back or seek an in-person evaluation if the symptoms worsen or if the condition fails to improve as anticipated.  Ann Held, DO [place] [facility] [phone] (phone) [phone] (fax)  [place]

## 2022-05-30 NOTE — Patient Instructions (Signed)
Sunday would be day 5--- as long as you are feeling better you may leave the house with a mask on for 5 more days  If not improving by mid-late next week --- return or go to ER   COVID-19 COVID-19, or coronavirus disease 2019, is an infection that is caused by a new (novel) coronavirus called SARS-CoV-2. COVID-19 can cause many symptoms. In some people, the virus may not cause any symptoms. In others, it may cause mild or severe symptoms. Some people with severe infection develop severe disease. What are the causes? This illness is caused by a virus. The virus may be in the air as tiny specks of fluid (aerosols) or droplets, or it may be on surfaces. You may catch the virus by: Breathing in droplets from an infected person. Droplets can be spread by a person breathing, speaking, singing, coughing, or sneezing. Touching something, like a table or a doorknob, that has virus on it (is contaminated) and then touching your mouth, nose, or eyes. What increases the risk? Risk for infection: You are more likely to get infected with the COVID-19 virus if: You are within 6 ft (1.8 m) of a person with COVID-19 for 15 minutes or longer. You are providing care for a person who is infected with COVID-19. You are in close personal contact with other people. Close personal contact includes hugging, kissing, or sharing eating or drinking utensils. Risk for serious illness caused by COVID-19: You are more likely to get seriously ill from the COVID-19 virus if: You have cancer. You have a long-term (chronic) disease, such as: Chronic lung disease. This includes pulmonary embolism, chronic obstructive pulmonary disease, and cystic fibrosis. Long-term disease that lowers your body's ability to fight infection (immunocompromise). Serious cardiac conditions, such as heart failure, coronary artery disease, or cardiomyopathy. Diabetes. Chronic kidney disease. Liver diseases. These include cirrhosis, nonalcoholic  fatty liver disease, alcoholic liver disease, or autoimmune hepatitis. You have obesity. You are pregnant or were recently pregnant. You have sickle cell disease. What are the signs or symptoms? Symptoms of this condition can range from mild to severe. Symptoms may appear any time from 2 to 14 days after being exposed to the virus. They include: Fever or chills. Shortness of breath or trouble breathing. Feeling tired or very tired. Headaches, body aches, or muscle aches. Runny or stuffy nose, sneezing, coughing, or sore throat. New loss of taste or smell. This is rare. Some people may also have stomach problems, such as nausea, vomiting, or diarrhea. Other people may not have any symptoms of COVID-19. How is this diagnosed? This condition may be diagnosed by testing samples to check for the COVID-19 virus. The most common tests are the PCR test and the antigen test. Tests may be done in the lab or at home. They include: Using a swab to take a sample of fluid from the back of your nose and throat (nasopharyngeal fluid), from your nose, or from your throat. Testing a sample of saliva from your mouth. Testing a sample of coughed-up mucus from your lungs (sputum). How is this treated? Treatment for COVID-19 infection depends on the severity of the condition. Mild symptoms can be managed at home with rest, fluids, and over-the-counter medicines. Serious symptoms may be treated in a hospital intensive care unit (ICU). Treatment in the ICU may include: Supplemental oxygen. Extra oxygen is given through a tube in the nose, a face mask, or a hood. Medicines. These may include: Antivirals, such as monoclonal antibodies. These help  your body fight off certain viruses that can cause disease. Anti-inflammatories, such as corticosteroids. These reduce inflammation and suppress the immune system. Antithrombotics. These prevent or treat blood clots, if they develop. Convalescent plasma. This helps boost  your immune system, if you have an underlying immunosuppressive condition or are getting immunosuppressive treatments. Prone positioning. This means you will lie on your stomach. This helps oxygen to get into your lungs. Infection control measures. If you are at risk for more serious illness caused by COVID-19, your health care provider may prescribe two long-acting monoclonal antibodies, given together every 6 months. How is this prevented? To protect yourself: Use preventive medicine (pre-exposure prophylaxis). You may get pre-exposure prophylaxis if you have moderate or severe immunocompromise. Get vaccinated. Anyone 1 months old or older who meets guidelines can get a COVID-19 vaccine or vaccine series. This includes people who are pregnant or making breast milk (lactating). Get an added dose of COVID-19 vaccine after your first vaccine or vaccine series if you have moderate to severe immunocompromise. This applies if you have had a solid organ transplant or have been diagnosed with an immunocompromising condition. You should get the added dose 4 weeks after you got the first COVID-19 vaccine or vaccine series. If you get an mRNA vaccine, you will need a 3-dose primary series. If you get the J&J/Janssen vaccine, you will need a 2-dose primary series, with the second dose being an mRNA vaccine. Talk to your health care provider about getting experimental monoclonal antibodies. This treatment is approved under emergency use authorization to prevent severe illness before or after being exposed to the COVID-19 virus. You may be given monoclonal antibodies if: You have moderate or severe immunocompromise. This includes treatments that lower your immune response. People with immunocompromise may not develop protection against COVID-19 when they are vaccinated. You cannot be vaccinated. You may not get a vaccine if you have a severe allergic reaction to the vaccine or its components. You are not fully  vaccinated. You are in a facility where COVID-19 is present and: Are in close contact with a person who is infected with the COVID-19 virus. Are at high risk of being exposed to the COVID-19 virus. You are at risk of illness from new variants of the COVID-19 virus. To protect others: If you have symptoms of COVID-19, take steps to prevent the virus from spreading to others. Stay home. Leave your house only to get medical care. Do not use public transit, if possible. Do not travel while you are sick. Wash your hands often with soap and water for at least 20 seconds. If soap and water are not available, use alcohol-based hand sanitizer. Make sure that all people in your household wash their hands well and often. Cough or sneeze into a tissue or your sleeve or elbow. Do not cough or sneeze into your hand or into the air. Where to find more information Centers for Disease Control and Prevention: [employer]arm[employer].be World Health Organization: https://www.castaneda.info/ Get help right away if: You have trouble breathing. You have pain or pressure in your chest. You are confused. You have bluish lips and fingernails. You have trouble waking from sleep. You have symptoms that get worse. These symptoms may be an emergency. Get help right away. Call 911. Do not wait to see if the symptoms will go away. Do not drive yourself to the hospital. Summary COVID-19 is an infection that is caused by a new coronavirus. Sometimes, there are no symptoms. Other times, symptoms range from mild  to severe. Some people with a severe COVID-19 infection develop severe disease. The virus that causes COVID-19 can spread from person to person through droplets or aerosols from breathing, speaking, singing, coughing, or sneezing. Mild symptoms of COVID-19 can be managed at home with rest, fluids, and over-the-counter medicines. This information is not intended to replace advice given to you by your health  care provider. Make sure you discuss any questions you have with your health care provider. Document Revised: 05/21/2021 Document Reviewed: 05/23/2021 Elsevier Patient Education  [place].

## 2022-06-11 ENCOUNTER — Other Ambulatory Visit: Payer: Self-pay | Admitting: Family Medicine

## 2022-06-11 DIAGNOSIS — K649 Unspecified hemorrhoids: Secondary | ICD-10-CM

## 2022-06-17 ENCOUNTER — Other Ambulatory Visit: Payer: Self-pay | Admitting: Cardiology

## 2022-06-19 ENCOUNTER — Ambulatory Visit: Payer: Medicaid Other | Admitting: Family Medicine

## 2022-06-20 ENCOUNTER — Encounter: Payer: Self-pay | Admitting: Family Medicine

## 2022-06-20 ENCOUNTER — Ambulatory Visit (HOSPITAL_BASED_OUTPATIENT_CLINIC_OR_DEPARTMENT_OTHER)
Admission: RE | Admit: 2022-06-20 | Discharge: 2022-06-20 | Disposition: A | Discharge status: Home / Self Care | Payer: Medicaid Other | Source: Ambulatory Visit | Attending: Family Medicine | Admitting: Family Medicine

## 2022-06-20 ENCOUNTER — Ambulatory Visit (INDEPENDENT_AMBULATORY_CARE_PROVIDER_SITE_OTHER): Payer: Medicaid Other | Admitting: Family Medicine

## 2022-06-20 VITALS — BP 120/84 | HR 90 | Temp 98.4°F | Resp 18 | Ht 70.0 in | Wt 176.6 lb

## 2022-06-20 DIAGNOSIS — I10 Essential (primary) hypertension: Secondary | ICD-10-CM | POA: Diagnosis not present

## 2022-06-20 DIAGNOSIS — E1169 Type 2 diabetes mellitus with other specified complication: Secondary | ICD-10-CM | POA: Diagnosis not present

## 2022-06-20 DIAGNOSIS — R918 Other nonspecific abnormal finding of lung field: Secondary | ICD-10-CM | POA: Diagnosis not present

## 2022-06-20 DIAGNOSIS — E785 Hyperlipidemia, unspecified: Secondary | ICD-10-CM | POA: Diagnosis not present

## 2022-06-20 DIAGNOSIS — Z09 Encounter for follow-up examination after completed treatment for conditions other than malignant neoplasm: Secondary | ICD-10-CM | POA: Insufficient documentation

## 2022-06-20 DIAGNOSIS — U071 COVID-19: Secondary | ICD-10-CM | POA: Diagnosis not present

## 2022-06-20 DIAGNOSIS — J0141 Acute recurrent pansinusitis: Secondary | ICD-10-CM

## 2022-06-20 DIAGNOSIS — Z8616 Personal history of COVID-19: Secondary | ICD-10-CM

## 2022-06-20 DIAGNOSIS — I4819 Other persistent atrial fibrillation: Secondary | ICD-10-CM

## 2022-06-20 DIAGNOSIS — E782 Mixed hyperlipidemia: Secondary | ICD-10-CM

## 2022-06-20 MED ORDER — AZELASTINE HCL 0.15 % NA SOLN: 1.0000 | Freq: Two times a day (BID) | NASAL | 3 refills | Status: AC

## 2022-06-20 MED ORDER — FAMOTIDINE 40 MG/5ML PO SUSR: 20.0000 mg | Freq: Every day | ORAL | 2 refills | Status: DC

## 2022-06-20 NOTE — Patient Instructions (Signed)

## 2022-06-20 NOTE — Progress Notes (Signed)
Subjective:   By signing my name below, I, Shehryar Baig, attest that this documentation has been prepared under the direction and in the presence of Ann Held, DO. 06/20/2022   Patient ID: Chase [name], male    DOB: Oct 04, 1960, 62 y.o.   MRN: [id number]  Chief Complaint  Patient presents with   Hyperlipidemia   Hypertension   Follow-up    HPI Patient is in today for a follow up visit.   He is requesting a refill for Azelastine nasal spray and 40 mg Pepcid.  He reports recovering from a Virus following Christmas. He continues having a cough at this time. He coughs around 2-3 times daily. He is interested in receiving a chest X-ray following this visit.  His blood pressure is doing well during this visit.  BP Readings from Last 3 Encounters:  06/20/22 120/84  05/20/22 122/82  01/14/22 132/78   Pulse Readings from Last 3 Encounters:  06/20/22 90  05/20/22 95  01/14/22 81    Past Medical History:  Diagnosis Date   Abnormality of gait 11/10/2012   Acute GI bleeding    Acute renal insufficiency 02/10/2015   Acute upper respiratory infection 08/10/2012   Allergic drug reaction 05/24/2020   Allergic rhinitis 10/16/2020   Allergy    Anemia 02/10/2015   Annual physical exam 11/26/2011   ANXIETY DEPRESSION 12/09/2007   Qualifier: Diagnosis of  By: Cori Razor RN, Mikal Plane    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 10/17/2014   Arthralgia 07/25/2010   Arthritis    Benign neoplasm of brain ([place]) 05/24/2019   Bigeminy ? 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 ([place]) 03/12/2018   Cerebrovascular accident, old 11/16/2014   Chest discomfort 06/08/2019   Chronic back pain 11/28/2020   Chronic cough  08/27/2014   CXR 08/2014:  Mild hyperinflation, no acute process Arlyce Harman 09/2014:  Difficulty with procedure, but essentially normal +CVA's in past, with aspiration risk by history Speech evaluation 2016:      Chronic left shoulder pain 08/06/2020   Chronic pain syndrome 11/23/2019   Chronic pansinusitis 06/30/2018   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   Constipation    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   DDD (degenerative disc disease), cervical 04/26/2019   DDD (degenerative disc disease), lumbar 04/26/2019   Decreased hearing of both ears 02/12/2013   Deviated septum 05/31/2018   Diabetes mellitus without complication ([place])    Difficulty urinating    DM (diabetes mellitus) type II uncontrolled, periph vascular disorder 01/02/2016   Dry skin 05/09/2019   Dysuria 11/29/2010   Essential (primary) hypertension 01/26/2015   Excessive daytime sleepiness 02/11/2018   FATIGUE 12/09/2007   Qualifier: Diagnosis of  By: Aurther Loft    Fibromyalgia 03/02/2017   Food intolerance 05/09/2019   GERD 12/09/2007   Qualifier: Diagnosis of  By: Niel Hummer MD, Lorinda Creed    GERD (gastroesophageal reflux disease)    GI bleed 02/10/2015   Headache(784.0)    Hearing loss    Hemangioma 02/12/2013   Hemorrhoid 05/22/2011   HTN (hypertension) 02/04/2010   Qualifier: Diagnosis of  By: Charlett Blake MD, Erline Levine  Hyperlipidemia    Hyperlipidemia associated with type 2 diabetes mellitus ([place]) 11/22/2020   Hyperlipidemia LDL goal <70 01/26/2015   Hypersomnia, persistent 11/05/2012   epworth of 18 points, was placed on CPAP in 2008 with very low AHI ( no number  Mentioned) and struggles ever since with PAP. 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   Leg swelling    Localized swelling of both lower legs 03/02/2017   Low back pain 07/20/2007   Qualifier: Diagnosis of  By: Niel Hummer MD, Lorinda Creed    Lower extremity edema 07/04/2020   Lower GI bleed    Mixed conductive and sensorineural hearing loss of right ear with restricted hearing of left ear 10/16/2020   Nasal congestion    Neck pain 03/15/2020   NEPHROLITHIASIS 08/13/2006   Qualifier: Diagnosis of  By: Beryle Lathe     Nocturia more than twice per night 02/11/2018   OSA on CPAP 10/17/2014   Otalgia of both ears 06/06/2021   Other chronic pain 03/02/2017   Other fatigue 07/04/2020   Pain in finger of right hand 10/06/2012   Pain in joint of right shoulder 02/21/2017   Palpitations 01/26/2015   Paresthesia 07/25/2010   Persistent atrial fibrillation ([place]) 08/29/2020   Persistent headaches 10/17/2014   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   Qualifier: Diagnosis of  By: Niel Hummer MD, Izell [place] R    PONV (postoperative nausea and vomiting)    Preoperative clearance 07/04/2020   Prolapsed hemorrhoids 10/05/2013   Radiculopathy, lumbar region 03/15/2020   Rash 05/24/2019   Raynaud's disease 03/02/2017   Raynaud's phenomenon 03/02/2017   Rectal bleeding 02/12/2015   Rectal pain    Rectal ulcer with bleeding after hemorrhoid banding 02/11/2015   Rhinitis, chronic 12/23/2006   Qualifier: Diagnosis of  By: Cori Razor RN, Regina G    Right shoulder pain 02/15/2015   Seasonal and perennial allergic rhinitis 05/09/2019   Severe episode of recurrent major depressive disorder, without psychotic features ([place]) 08/29/2020   Sinus infection 06/30/2014   Sinusitis, acute, maxillary 02/02/2014   Sleep apnea    Sleep apnea with use of continuous positive airway pressure (CPAP) 02/11/2018   SOB (shortness of breath) 07/04/2020   Stroke ([place])    Stroke ([place])    Subacute confusional state 10/17/2014   SYMPTOM,  HYPERSOMNIA NOS 02/19/2007   Qualifier: Diagnosis of  By: Tilden Dome     Thrombosed hemorrhoids    Tick bite of back 11/23/2019   Tinnitus of both ears 08/29/2020   Trouble swallowing    Unspecified hemorrhoids 02/10/2015   Unspecified sleep apnea 11/05/2012   Varicose veins of lower extremities with other complications 11/94/1740   Vertigo 03/19/2012    Past Surgical History:  Procedure Laterality Date   CATARACT EXTRACTION     x 3   EYE SURGERY  1968, 1985, 1987   cataracts   FLEXIBLE SIGMOIDOSCOPY N/A 02/11/2015   Procedure: FLEXIBLE SIGMOIDOSCOPY;  Surgeon: Gatha Mayer, MD;  Location: [facility];  Service: Endoscopy;  Laterality: N/A;   INNER EAR SURGERY     rt ear   INNER EAR SURGERY  1992   surgery - right arm     1983  Family History  Problem Relation Age of Onset   Breast cancer Mother 69       breast   Huntington's disease Mother    Stroke Father    Heart disease Father    Hypertension Father    Heart attack Father    Hypertension Brother    Hyperlipidemia Neg Hx    Diabetes Neg Hx     Social History   Socioeconomic History   Marital status: Single    Spouse name: Not on file   Number of children: 0   Years of education: Not on file   Highest education level: Not on file  Occupational History   Not on file  Tobacco Use   Smoking status: Never   Smokeless tobacco: Never  Vaping Use   Vaping Use: Never used  Substance and Sexual Activity   Alcohol use: No    Alcohol/week: 0.0 standard drinks of alcohol    Comment: none   Drug use: No   Sexual activity: Never  Other Topics Concern   Not on file  Social History Narrative   No caffeine intake except for chocolate.  Exercised-walking   Social Determinants of Radio broadcast assistant Strain: Not on file  Food Insecurity: Not on file  Transportation Needs: Not on file  Physical Activity: Not on file  Stress: Not on file  Social Connections: Not on file  Intimate Partner  Violence: Not on file    Outpatient Medications Prior to Visit  Medication Sig Dispense Refill   Accu-Chek Softclix Lancets lancets Use as instructed 1x a day 100 each PRN   allopurinol (ZYLOPRIM) 100 MG tablet TAKE 1 TABLET BY MOUTH TWICE A DAY 180 tablet 1   amLODipine (NORVASC) 2.5 MG tablet TAKE 1 TABLET BY MOUTH EVERY DAY 90 tablet 0   BD PEN NEEDLE NANO 2ND GEN 32G X 4 MM MISC USE ONCE DAILY AS DIRECTED 100 each 3   clotrimazole-betamethasone (LOTRISONE) cream Apply 1 application topically as needed for rash.     Continuous Blood Gluc Receiver (FREESTYLE LIBRE 2 READER) DEVI 1 each by Does not apply route daily. 1 each 0   Continuous Blood Gluc Sensor (FREESTYLE LIBRE 2 SENSOR) MISC 1 each by Does not apply route every 14 (fourteen) days. 6 each 3   cyclobenzaprine (FLEXERIL) 10 MG tablet TAKE 1 TABLET BY MOUTH TWICE A DAY AS NEEDED FOR MUSCLE SPASMS 42 tablet 2   EPINEPHrine 0.3 mg/0.3 mL IJ SOAJ injection Inject 0.3 mg into the muscle as needed for anaphylaxis. 1 each 0   FIBER SELECT GUMMIES PO Take 1 tablet by mouth daily.     fluticasone (CUTIVATE) 0.05 % cream Apply 1 application topically 2 (two) times daily.     gabapentin (NEURONTIN) 100 MG capsule TAKE 1 CAPSULE BY MOUTH THREE TIMES A DAY 90 capsule 3   GAVILAX 17 GM/SCOOP powder TAKE 17 G BY MOUTH DAILY AS NEEDED. 238 g 5   glucose blood (ACCU-CHEK GUIDE) test strip Use as instructed 1x a day - 100 each PRN   hydrocortisone (ANUSOL-HC) 2.5 % rectal cream PLACE 1 APPLICATION RECTALLY 2 (TWO) TIMES DAILY. 30 g 2   hydrOXYzine (ATARAX) 25 MG tablet TAKE 1 TABLET (25 MG TOTAL) BY MOUTH EVERY 8 (EIGHT) HOURS AS NEEDED FOR ANXIETY OR ITCHING. 270 tablet 2   JARDIANCE 25 MG TABS tablet TAKE 1 TABLET (25 MG TOTAL) BY MOUTH DAILY. 90 tablet 3   losartan (COZAAR) 100 MG tablet TAKE 1 TABLET  BY MOUTH EVERY DAY 90 tablet 1   metFORMIN (GLUCOPHAGE) 500 MG tablet Take 1 tablet (500 mg total) by mouth 2 (two) times daily with a meal. 180  tablet 3   metoprolol tartrate (LOPRESSOR) 50 MG tablet Take 1 tablet (50 mg total) by mouth daily. 90 tablet 1   pravastatin (PRAVACHOL) 20 MG tablet TAKE 2 TABLETS (40 MG TOTAL) BY MOUTH DAILY. 180 tablet 1   promethazine-dextromethorphan (PROMETHAZINE-DM) 6.25-15 MG/5ML syrup Take 5 mLs by mouth 4 (four) times daily as needed. 118 mL 0   Semaglutide,0.25 or 0.'5MG'$ /DOS, (OZEMPIC, 0.25 OR 0.5 MG/DOSE,) 2 MG/1.5ML SOPN Inject 0.5 mg into the skin once a week. 4.5 mL 5   tamsulosin (FLOMAX) 0.4 MG CAPS capsule      terazosin (HYTRIN) 5 MG capsule Take 5 mg by mouth daily.     triamcinolone (NASACORT) 55 MCG/ACT AERO nasal inhaler Place 2 sprays into the nose daily. 1 each 12   venlafaxine XR (EFFEXOR-XR) 75 MG 24 hr capsule TAKE 1 CAPSULE BY MOUTH DAILY WITH BREAKFAST. 90 capsule 1   Azelastine HCl (ASTEPRO) 0.15 % SOLN Place 1 spray into the nose in the morning and at bedtime. 1 mL 3   famotidine (PEPCID) 40 MG/5ML suspension Take 2.5 mLs by mouth daily.     No facility-administered medications prior to visit.    Allergies  Allergen Reactions   Amoxicillin Hives, Itching and Other (See Comments)    White tongue; ? hives   Sulfa Antibiotics Hives and Rash   Terfenadine     ? reaction    Review of Systems  Respiratory:  Positive for cough.        Objective:    Physical Exam  BP 120/84 (BP Location: Left Arm, Patient Position: Sitting, Cuff Size: Normal)   Pulse 90   Temp 98.4 F (36.9 C) (Oral)   Resp 18   Ht '5\' 10"'$  (1.778 m)   Wt 176 lb 9.6 oz (80.1 kg)   SpO2 97%   BMI 25.34 kg/m  Wt Readings from Last 3 Encounters:  06/20/22 176 lb 9.6 oz (80.1 kg)  05/20/22 176 lb 6.4 oz (80 kg)  01/14/22 174 lb (78.9 kg)       Assessment & Plan:  Hyperlipidemia associated with type 2 diabetes mellitus (HCC) -     CBC with Differential/Platelet -     Comprehensive metabolic panel -     Lipid panel -     TSH  Acute recurrent pansinusitis -     Azelastine HCl; Place 1 spray  into the nose in the morning and at bedtime.  Dispense: 1 mL; Refill: 3  Primary hypertension -     CBC with Differential/Platelet -     Comprehensive metabolic panel -     Lipid panel -     TSH  Persistent atrial fibrillation (HCC) -     CBC with Differential/Platelet -     Comprehensive metabolic panel -     Lipid panel -     TSH  Other orders -     Famotidine; Take 2.5 mLs (20 mg total) by mouth daily.  Dispense: 50 mL; Refill: 2    I, Shehryar Baig, personally preformed the services described in this documentation.  All medical record entries made by the scribe were at my direction and in my presence.  I have reviewed the chart and discharge instructions (if applicable) and agree that the record reflects my personal performance and is accurate and complete.  06/20/2022   I,Shehryar Baig,acting as a scribe for Ann Held, DO.,have documented all relevant documentation on the behalf of Ann Held, DO,as directed by  Ann Held, DO while in the presence of Ann Held, DO.   Shehryar [employer]

## 2022-06-21 ENCOUNTER — Encounter: Payer: Self-pay | Admitting: Family Medicine

## 2022-06-21 ENCOUNTER — Other Ambulatory Visit: Payer: Self-pay | Admitting: Family Medicine

## 2022-06-21 DIAGNOSIS — J189 Pneumonia, unspecified organism: Secondary | ICD-10-CM

## 2022-06-21 LAB — CBC WITH DIFFERENTIAL/PLATELET
Absolute Monocytes: 730 cells/uL (ref 200–950)
Basophils Absolute: 17 cells/uL (ref 0–200)
Basophils Relative: 0.2 %
Eosinophils Absolute: 199 cells/uL (ref 15–500)
Eosinophils Relative: 2.4 %
HCT: 39.2 % (ref 38.5–50.0)
Hemoglobin: 13.6 g/dL (ref 13.2–17.1)
Lymphs Abs: 1843 cells/uL (ref 850–3900)
MCH: 30.4 pg (ref 27.0–33.0)
MCHC: 34.7 g/dL (ref 32.0–36.0)
MCV: 87.5 fL (ref 80.0–100.0)
MPV: 12 fL (ref 7.5–12.5)
Monocytes Relative: 8.8 %
Neutro Abs: 5511 cells/uL (ref 1500–7800)
Neutrophils Relative %: 66.4 %
Platelets: 364 10*3/uL (ref 140–400)
RBC: 4.48 10*6/uL (ref 4.20–5.80)
RDW: 12.7 % (ref 11.0–15.0)
Total Lymphocyte: 22.2 %
WBC: 8.3 10*3/uL (ref 3.8–10.8)

## 2022-06-21 LAB — COMPREHENSIVE METABOLIC PANEL
AG Ratio: 2.1 (calc) (ref 1.0–2.5)
ALT: 28 U/L (ref 9–46)
AST: 21 U/L (ref 10–35)
Albumin: 4.6 g/dL (ref 3.6–5.1)
Alkaline phosphatase (APISO): 81 U/L (ref 35–144)
BUN: 8 mg/dL (ref 7–25)
CO2: 30 mmol/L (ref 20–32)
Calcium: 9.8 mg/dL (ref 8.6–10.3)
Chloride: 99 mmol/L (ref 98–110)
Creat: 0.89 mg/dL (ref 0.70–1.35)
Globulin: 2.2 g/dL (calc) (ref 1.9–3.7)
Glucose, Bld: 143 mg/dL — ABNORMAL HIGH (ref 65–99)
Potassium: 4.3 mmol/L (ref 3.5–5.3)
Sodium: 141 mmol/L (ref 135–146)
Total Bilirubin: 0.6 mg/dL (ref 0.2–1.2)
Total Protein: 6.8 g/dL (ref 6.1–8.1)

## 2022-06-21 LAB — LIPID PANEL
Cholesterol: 161 mg/dL (ref <200)
HDL: 37 mg/dL — ABNORMAL LOW (ref >=40)
LDL Cholesterol (Calc): 93 mg/dL (calc)
Non-HDL Cholesterol (Calc): 124 mg/dL (calc) (ref <130)
Total CHOL/HDL Ratio: 4.4 (calc) (ref <5.0)
Triglycerides: 216 mg/dL — ABNORMAL HIGH (ref <150)

## 2022-06-21 LAB — TSH: TSH: 3.25 mIU/L (ref 0.40–4.50)

## 2022-06-21 MED ORDER — AZITHROMYCIN 250 MG PO TABS: ORAL_TABLET | ORAL | 0 refills | Status: DC

## 2022-06-21 NOTE — Assessment & Plan Note (Signed)
Well controlled, no changes to meds. Encouraged heart healthy diet such as the DASH diet and exercise as tolerated.  °

## 2022-06-25 LAB — SARS-COV-2 SEMI-QUANTITATIVE TOTAL ANTIBODY, SPIKE: SARS COV2 AB, Total Spike Semi QN: >2500 U/mL — ABNORMAL HIGH (ref <0.8)

## 2022-06-26 ENCOUNTER — Other Ambulatory Visit: Payer: Self-pay | Admitting: Family Medicine

## 2022-06-26 DIAGNOSIS — E785 Hyperlipidemia, unspecified: Secondary | ICD-10-CM

## 2022-07-08 DIAGNOSIS — N401 Enlarged prostate with lower urinary tract symptoms: Secondary | ICD-10-CM | POA: Diagnosis not present

## 2022-07-08 DIAGNOSIS — R39198 Other difficulties with micturition: Secondary | ICD-10-CM | POA: Diagnosis not present

## 2022-07-08 DIAGNOSIS — N312 Flaccid neuropathic bladder, not elsewhere classified: Secondary | ICD-10-CM | POA: Diagnosis not present

## 2022-07-08 DIAGNOSIS — R3912 Poor urinary stream: Secondary | ICD-10-CM | POA: Diagnosis not present

## 2022-07-08 DIAGNOSIS — N3289 Other specified disorders of bladder: Secondary | ICD-10-CM | POA: Diagnosis not present

## 2022-07-23 ENCOUNTER — Other Ambulatory Visit: Payer: Self-pay

## 2022-07-23 DIAGNOSIS — R21 Rash and other nonspecific skin eruption: Secondary | ICD-10-CM

## 2022-07-23 HISTORY — DX: Rash and other nonspecific skin eruption: R21

## 2022-07-28 ENCOUNTER — Other Ambulatory Visit: Payer: Self-pay | Admitting: Family Medicine

## 2022-07-28 DIAGNOSIS — F322 Major depressive disorder, single episode, severe without psychotic features: Secondary | ICD-10-CM

## 2022-07-30 ENCOUNTER — Encounter: Payer: Self-pay | Admitting: Cardiology

## 2022-07-30 ENCOUNTER — Ambulatory Visit: Payer: Medicaid Other | Attending: Cardiology | Admitting: Cardiology

## 2022-07-30 VITALS — BP 116/70 | HR 89 | Ht 69.6 in | Wt 175.1 lb

## 2022-07-30 DIAGNOSIS — E785 Hyperlipidemia, unspecified: Secondary | ICD-10-CM | POA: Diagnosis not present

## 2022-07-30 DIAGNOSIS — I10 Essential (primary) hypertension: Secondary | ICD-10-CM | POA: Diagnosis not present

## 2022-07-30 DIAGNOSIS — E1169 Type 2 diabetes mellitus with other specified complication: Secondary | ICD-10-CM | POA: Diagnosis not present

## 2022-07-30 DIAGNOSIS — G4730 Sleep apnea, unspecified: Secondary | ICD-10-CM | POA: Diagnosis not present

## 2022-07-30 NOTE — Progress Notes (Signed)
Cardiology Office Note:    Date:  07/30/2022   ID:  Chase [name], DOB [birth year], MRN [id number]  PCP:  Chase Held, DO  Cardiologist:  Chase Lindau, [name]   Referring [name]: Chase [name], Chase [name], *    ASSESSMENT:    1. Primary hypertension   2. Essential (primary) hypertension   3. Sleep apnea with use of continuous positive airway pressure (CPAP)   4. Hyperlipidemia associated with type 2 diabetes mellitus ([place])    PLAN:    In order of problems listed above:  Primary prevention stressed with the patient.  Importance of compliance with diet medication stressed any vocalized understanding.  He was advised to be active and Keep ambulatory to the best of his ability. Essential hypertension: Blood pressure stable and diet was emphasized. Mixed dyslipidemia: Lipids were reviewed.  Triglycerides and LDL is not at goal I cautioned him against this and he promises to do better. Diabetes mellitus: Elevated hemoglobin A1c.  He promises to do better his doctor's working closely with him.  I will be seeing him in follow-up appointment in 9 months.  Diet emphasized.  If his lipids are not to goal I may switch him to a more potent statin.  He is agreeable. Patient will be seen in follow-up appointment in 9 months or earlier if the patient has any concerns    Medication Adjustments/Labs and Tests Ordered: Current medicines are reviewed at length with the patient today.  Concerns regarding medicines are outlined above.  Orders Placed This Encounter  Procedures   EKG 12-Lead   No orders of the defined types were placed in this encounter.    No chief complaint on file.    History of Present Illness:    Chase [name] is a 62 y.o. male.  Patient has past medical history of essential hypertension, dyslipidemia and diabetes mellitus.  He denies any problems at this time and takes care of activities of daily living.  No chest pain orthopnea or PND.  At the time of my evaluation,  the patient is alert awake oriented and in no distress.  Overall he is a frail gentleman and a poor historian.  Past Medical History:  Diagnosis Date   Abnormality of gait 11/10/2012   Acute GI bleeding    Acute renal insufficiency 02/10/2015   Acute upper respiratory infection 08/10/2012   Allergic drug reaction 05/24/2020   Allergic rhinitis 10/16/2020   Allergy    Anemia 02/10/2015   Annual physical exam 11/26/2011   ANXIETY DEPRESSION 12/09/2007   Qualifier: Diagnosis of  By: Chase [name], Chase [name]    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 10/17/2014   Arthralgia 07/25/2010   Arthritis    Benign neoplasm of brain ([place]) 05/24/2019   Bigeminy ? 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 ([place]) 03/12/2018   Cerebrovascular accident, old 11/16/2014   Chest discomfort 06/08/2019   Chronic back pain 11/28/2020   Chronic cough 08/27/2014   CXR 08/2014:  Mild hyperinflation, no acute process Chase [name] 09/2014:  Difficulty with procedure, but essentially normal +CVA's in past, with aspiration risk by history Speech evaluation 2016:      Chronic left shoulder pain 08/06/2020  Chronic pain syndrome 11/23/2019   Chronic pansinusitis 06/30/2018   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   Constipation    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   DDD (degenerative disc disease), cervical 04/26/2019   DDD (degenerative disc disease), lumbar 04/26/2019   Decreased hearing of both ears 02/12/2013   Deviated septum 05/31/2018   Diabetes mellitus without complication ([place])    Difficulty urinating    DM (diabetes mellitus) type II uncontrolled, periph vascular disorder 01/02/2016   Dry skin 05/09/2019    Dysuria 11/29/2010   Essential (primary) hypertension 01/26/2015   Excessive daytime sleepiness 02/11/2018   FATIGUE 12/09/2007   Qualifier: Diagnosis of  By: Chase [name]    Fibromyalgia 03/02/2017   Food intolerance 05/09/2019   GERD 12/09/2007   Qualifier: Diagnosis of  By: Chase [name], Chase [name]    GERD (gastroesophageal reflux disease)    GI bleed 02/10/2015   Headache(784.0)    Hearing loss    Hemangioma 02/12/2013   Hemorrhoid 05/22/2011   HTN (hypertension) 02/04/2010   Qualifier: Diagnosis of  By: Chase [name], Chase [name]     Hyperlipidemia    Hyperlipidemia associated with type 2 diabetes mellitus ([place]) 11/22/2020   Hyperlipidemia LDL goal <70 01/26/2015   Hypersomnia, persistent 11/05/2012   epworth of 18 points, was placed on CPAP in 2008 with very low AHI ( no number  Mentioned) and struggles ever since with PAP. 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   Leg swelling    Localized swelling of both lower legs 03/02/2017   Low back pain 07/20/2007   Qualifier: Diagnosis of  By: Chase [name], Chase [name]    Lower extremity edema 07/04/2020   Lower GI bleed    Melena 12/16/2021   Mixed conductive and sensorineural hearing loss of right ear with restricted hearing of left ear 10/16/2020   Nasal congestion    Neck pain 03/15/2020   NEPHROLITHIASIS 08/13/2006   Qualifier: Diagnosis of  By: Beryle Lathe     Nocturia more than twice per night 02/11/2018   OSA on CPAP 10/17/2014   Otalgia of both ears 06/06/2021   Other chronic pain 03/02/2017   Other fatigue 07/04/2020   Pain in finger of right  hand 10/06/2012   Pain in joint of right shoulder 02/21/2017   Palpitations 01/26/2015   Paresthesia 07/25/2010   Persistent atrial fibrillation ([place]) 08/29/2020   Persistent headaches 10/17/2014   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   Qualifier: Diagnosis of  By: Chase [name], Izell  R    PONV (postoperative nausea and vomiting)    Preoperative clearance 07/04/2020   Prolapsed hemorrhoids 10/05/2013   Radiculopathy, lumbar region 03/15/2020   Rash 05/24/2019   Rash and other nonspecific skin eruption 07/23/2022   Raynaud's disease 03/02/2017   Raynaud's phenomenon 03/02/2017   Rectal bleeding 02/12/2015   Rectal pain    Rectal ulcer with bleeding after hemorrhoid banding 02/11/2015   Rhinitis, chronic 12/23/2006   Qualifier: Diagnosis of  By: Chase [name], Regina G    Right shoulder pain 02/15/2015   Seasonal and perennial allergic rhinitis 05/09/2019  Severe episode of recurrent major depressive disorder, without psychotic features ([place]) 08/29/2020   Sinus infection 06/30/2014   Sinusitis, acute, maxillary 02/02/2014   Sleep apnea    Sleep apnea with use of continuous positive airway pressure (CPAP) 02/11/2018   SOB (shortness of breath) 07/04/2020   Stroke ([place])    Stroke ([place])    Subacute confusional state 10/17/2014   SYMPTOM, HYPERSOMNIA NOS 02/19/2007   Qualifier: Diagnosis of  By: Tilden Dome     Thrombosed hemorrhoids    Tick bite of back 11/23/2019   Tinnitus of both ears 08/29/2020   Trouble swallowing    Unspecified hemorrhoids 02/10/2015   Unspecified sleep apnea 11/05/2012   UTI (urinary tract infection) 05/19/2022   Varicose veins of lower extremities with other complications [id number]   Vasomotor rhinitis 06/28/2021   Vertigo 03/19/2012    Past Surgical History:  Procedure Laterality Date   CATARACT EXTRACTION     x 3   EYE SURGERY  1968, 1985, 1987   cataracts   FLEXIBLE SIGMOIDOSCOPY N/A 02/11/2015   Procedure: FLEXIBLE SIGMOIDOSCOPY;  Surgeon: Gatha Mayer, [name];  Location: [facility];  Service: Endoscopy;  Laterality: N/A;   INNER EAR SURGERY     rt ear   INNER EAR SURGERY  1992   surgery - right arm     1983    Current Medications: Current Meds  Medication Sig   Accu-Chek Softclix Lancets lancets Use as instructed 1x a day   allopurinol (ZYLOPRIM) 100 MG tablet TAKE 1 TABLET BY MOUTH TWICE A DAY   amLODipine (NORVASC) 2.5 MG tablet TAKE 1 TABLET BY MOUTH EVERY DAY   Azelastine HCl (ASTEPRO) 0.15 % SOLN Place 1 spray into the nose in the morning and at bedtime.   azithromycin (ZITHROMAX Z-PAK) 250 MG tablet As directed   BD PEN NEEDLE NANO 2ND GEN 32G X 4 MM MISC USE ONCE DAILY AS DIRECTED   clotrimazole-betamethasone (LOTRISONE) cream Apply 1 application topically as needed for rash.   Continuous Blood Gluc Receiver (FREESTYLE LIBRE 2 READER) DEVI 1 each by Does not apply route daily.   Continuous Blood Gluc Sensor (FREESTYLE LIBRE 2 SENSOR) MISC 1 each by Does not apply route every 14 (fourteen) days.   cyclobenzaprine (FLEXERIL) 10 MG tablet TAKE 1 TABLET BY MOUTH TWICE A DAY AS NEEDED FOR MUSCLE SPASM   EPINEPHrine 0.3 mg/0.3 mL IJ SOAJ injection Inject 0.3 mg into the muscle as needed for anaphylaxis.   famotidine (PEPCID) 40 MG/5ML suspension Take 2.5 mLs (20 mg total) by mouth daily.   FIBER SELECT GUMMIES PO Take 1 tablet by mouth daily.   finasteride (PROSCAR) 5 MG tablet Take 5 mg by mouth daily.   fluticasone (CUTIVATE) 0.05 % cream Apply 1 application topically 2 (two) times daily.   gabapentin (NEURONTIN) 100 MG capsule TAKE 1 CAPSULE BY MOUTH THREE TIMES A DAY   GAVILAX 17 GM/SCOOP powder TAKE 17 G BY MOUTH DAILY AS NEEDED.   glucose blood (ACCU-CHEK GUIDE) test strip Use as instructed 1x a day -   hydrocortisone (ANUSOL-HC) 2.5 % rectal cream PLACE 1 APPLICATION RECTALLY 2 (TWO) TIMES DAILY.   hydrOXYzine (ATARAX) 25 MG tablet TAKE 1 TABLET (25 MG TOTAL) BY MOUTH EVERY 8 (EIGHT) HOURS AS NEEDED FOR ANXIETY OR  ITCHING.   JARDIANCE 25 MG TABS tablet TAKE 1 TABLET (25 MG TOTAL) BY MOUTH DAILY.   losartan (COZAAR) 100 MG tablet TAKE 1 TABLET BY MOUTH EVERY DAY   metFORMIN (GLUCOPHAGE)  500 MG tablet Take 1 tablet (500 mg total) by mouth 2 (two) times daily with a meal.   metoprolol tartrate (LOPRESSOR) 50 MG tablet Take 1 tablet (50 mg total) by mouth daily.   OZEMPIC, 0.25 OR 0.5 MG/DOSE, 2 MG/3ML SOPN Inject 0.5 mg into the skin once a week.   pravastatin (PRAVACHOL) 20 MG tablet TAKE 2 TABLETS (40 MG TOTAL) BY MOUTH DAILY.   promethazine-dextromethorphan (PROMETHAZINE-DM) 6.25-15 MG/5ML syrup Take 5 mLs by mouth 4 (four) times daily as needed.   tamsulosin (FLOMAX) 0.4 MG CAPS capsule Take 0.4 mg by mouth daily.   terazosin (HYTRIN) 5 MG capsule Take 5 mg by mouth daily.   triamcinolone (NASACORT) 55 MCG/ACT AERO nasal inhaler Place 2 sprays into the nose daily.   venlafaxine XR (EFFEXOR-XR) 75 MG 24 hr capsule Take 1 capsule (75 mg total) by mouth daily with breakfast.     Allergies:   Amoxicillin, Sulfa antibiotics, and Terfenadine   Social History   Socioeconomic History   Marital status: Single    Spouse name: Not on file   Number of children: 0   Years of education: Not on file   Highest education level: Not on file  Occupational History   Not on file  Tobacco Use   Smoking status: Never   Smokeless tobacco: Never  Vaping Use   Vaping Use: Never used  Substance and Sexual Activity   Alcohol use: No    Alcohol/week: 0.0 standard drinks of alcohol    Comment: none   Drug use: No   Sexual activity: Never  Other Topics Concern   Not on file  Social History Narrative   No caffeine intake except for chocolate.  Exercised-walking   Social Determinants of Radio broadcast assistant Strain: Not on file  Food Insecurity: Not on file  Transportation Needs: Not on file  Physical Activity: Not on file  Stress: Not on file  Social Connections: Not on file     Family History: The  patient's family history includes Breast cancer (age of onset: 37) in his mother; Heart attack in his father; Heart disease in his father; Huntington's disease in his mother; Hypertension in his brother and father; Stroke in his father. There is no history of Hyperlipidemia or Diabetes.  ROS:   Please see the history of present illness.    All other systems reviewed and are negative.  EKGs/Labs/Other Studies Reviewed:    The following studies were reviewed today: I discussed my findings with him.  EKG reveals sinus rhythm and nonspecific ST-T changes.   Recent Labs: 06/20/2022: ALT 28; BUN 8; Creat 0.89; Hemoglobin 13.6; Platelets 364; Potassium 4.3; Sodium 141; TSH 3.25  Recent Lipid Panel    Component Value Date/Time   CHOL 161 06/20/2022 1550   TRIG 216 (H) 06/20/2022 1550   HDL 37 (L) 06/20/2022 1550   CHOLHDL 4.4 06/20/2022 1550   VLDL 30.0 12/16/2021 1118   LDLCALC 93 06/20/2022 1550   LDLDIRECT 56.0 08/21/2020 0919    Physical Exam:    VS:  BP 116/70   Pulse 89   Ht 5' 9.6" (1.768 m)   Wt 175 lb 1.9 oz (79.4 kg)   SpO2 97%   BMI 25.42 kg/m     Wt Readings from Last 3 Encounters:  07/30/22 175 lb 1.9 oz (79.4 kg)  06/20/22 176 lb 9.6 oz (80.1 kg)  05/20/22 176 lb 6.4 oz (80 kg)     GEN: Patient is in no acute  distress HEENT: Normal NECK: No JVD; No carotid bruits LYMPHATICS: No lymphadenopathy CARDIAC: Hear sounds regular, 2/6 systolic murmur at the apex. RESPIRATORY:  Clear to auscultation without rales, wheezing or rhonchi  ABDOMEN: Soft, non-tender, non-distended MUSCULOSKELETAL:  No edema; No deformity  SKIN: Warm and dry NEUROLOGIC:  Alert and oriented x 3 PSYCHIATRIC:  Normal affect   Signed, Chase Lindau, [name]  07/30/2022 4:51 PM    [facility]

## 2022-07-30 NOTE — Patient Instructions (Signed)

## 2022-08-19 ENCOUNTER — Telehealth: Payer: Self-pay

## 2022-08-19 NOTE — Telephone Encounter (Signed)
PA request received via CMM for Ozempic (0.25 or 0.5 MG/DOSE) '2MG'$ /3ML pen-injectors  PA has been submitted to [employer] and is pending determination.   Key: [id number]

## 2022-08-19 NOTE — Telephone Encounter (Signed)
PA has been APPROVED from 08/19/2022-08/19/2023

## 2022-09-01 ENCOUNTER — Other Ambulatory Visit: Payer: Self-pay | Admitting: Cardiology

## 2022-09-09 ENCOUNTER — Other Ambulatory Visit: Payer: Self-pay | Admitting: Family Medicine

## 2022-09-09 DIAGNOSIS — I10 Essential (primary) hypertension: Secondary | ICD-10-CM

## 2022-09-18 ENCOUNTER — Other Ambulatory Visit: Payer: Self-pay | Admitting: Family Medicine

## 2022-09-18 DIAGNOSIS — K649 Unspecified hemorrhoids: Secondary | ICD-10-CM

## 2022-09-19 ENCOUNTER — Ambulatory Visit (INDEPENDENT_AMBULATORY_CARE_PROVIDER_SITE_OTHER): Payer: Medicaid Other | Admitting: Internal Medicine

## 2022-09-19 ENCOUNTER — Encounter: Payer: Self-pay | Admitting: Internal Medicine

## 2022-09-19 ENCOUNTER — Telehealth: Payer: Self-pay

## 2022-09-19 VITALS — BP 120/80 | HR 84 | Ht 69.0 in | Wt 174.2 lb

## 2022-09-19 DIAGNOSIS — E1165 Type 2 diabetes mellitus with hyperglycemia: Secondary | ICD-10-CM | POA: Diagnosis not present

## 2022-09-19 DIAGNOSIS — E1159 Type 2 diabetes mellitus with other circulatory complications: Secondary | ICD-10-CM | POA: Diagnosis not present

## 2022-09-19 DIAGNOSIS — E785 Hyperlipidemia, unspecified: Secondary | ICD-10-CM

## 2022-09-19 LAB — POCT GLYCOSYLATED HEMOGLOBIN (HGB A1C): Hemoglobin A1C: 7.8 % — AB (ref 4.0–5.6)

## 2022-09-19 MED ORDER — EMPAGLIFLOZIN 25 MG PO TABS: 25.0000 mg | ORAL_TABLET | Freq: Every day | ORAL | 3 refills | Status: DC

## 2022-09-19 MED ORDER — OZEMPIC (0.25 OR 0.5 MG/DOSE) 2 MG/3ML ~~LOC~~ SOPN: 0.5000 mg | PEN_INJECTOR | SUBCUTANEOUS | 3 refills | Status: DC

## 2022-09-19 NOTE — Telephone Encounter (Signed)
Pt advised PA needed for Jardiance 25 mg.

## 2022-09-19 NOTE — Patient Instructions (Addendum)
Please continue: - Metformin 500 mg 1x a day - Jardiance 25 mg before b'fast - Ozempic  0.5 mg weekly  STOP JUICE OR OTHER SWEET DRINKS!  Check some sugars at bedtime.  Look into [employer], research (life sciences).  Please return in 4 months with your sugar log.

## 2022-09-19 NOTE — Progress Notes (Signed)
Patient ID: Chase [name], male   DOB: 1960-12-25, 62 y.o.   MRN: [id number]  HPI: Chase [name] is a 62 y.o.-year-old male, presenting for follow-up for DM2, dx in 2014, non-insulin-dependent, uncontrolled, with complications (PVD, cerebro-vascular ds - s/p "mini"strokes, PN). Last visit 4 months ago. He had [employer], now [place] only.  Interim history: He has occasional blurry vision, no increased urination. He continues to have memory loss and difficulty word finding. At last visit, drinking more apple juice as plain water bothers him to have indigestion.  I strongly advised him to stop. He did not stop. He still drinks 1-2 x12 oz glasses a day.  HbA1c levels reviewed: Lab Results  Component Value Date   HGBA1C 8.2 (A) 05/20/2022   HGBA1C 6.5 (A) 01/14/2022   HGBA1C 6.7 (A) 09/10/2021   HGBA1C 6.7 (A) 06/03/2021   HGBA1C 6.2 (A) 01/03/2021   HGBA1C 6.7 (A) 09/10/2020   HGBA1C 8.2 (H) 05/30/2020   HGBA1C 8.3 (H) 05/24/2020   HGBA1C 9.7 (H) 04/05/2020   HGBA1C 8.3 (H) 05/24/2019   HGBA1C 7.9 (A) 08/04/2018   HGBA1C 7.5 (A) 12/21/2017   HGBA1C 7.4 09/17/2017   HGBA1C 7.8 06/19/2017   HGBA1C 7.1 (H) 02/19/2017   HGBA1C 7.4 02/21/2016   HGBA1C 9.8 (H) 11/13/2015   HGBA1C 6.8 (H) 02/11/2015   HGBA1C 6.8 (H) 10/03/2014   HGBA1C 6.6 (H) 05/26/2014   Pt is on a regimen of:  - Metformin 500 mg 1x a day >> 2x >> 1x a day b/c problems with swallowing - Jardiance 25 mg at night >> before breakfast - Januvia 100 mg in am >> Victoza 1.8 mg weekly-started 05/2020 >> Ozempic 0.5 mg weekly In 11/2018, we tried to change from Januvia to Ozempic but this was not covered. Stopped Glipizide b/c rash. Stopped Invokana 100 mg in am >> only got this for 1 mo He was on Metformin 500 mg po bid - in liquid form, as he could not swallow pills. He gets gi upset. He stopped this in 2014.  He is not checking sugars-fingerstick values are not accurate due to Raynaud's syndrome, however,  freestyle libre CGM was not approved by his insurance - after several trials.  He checks sugars approximately 0-1x a day: - brunch: 123, 124-138 >> 139-181, 204, 210 >>  148, 152, 162, 163, 171 - 2h after brunch: 137-151 >> N/c >> 99-135, 145 >> 204-214 >> 145, 159 - before dinner: 150-200 >> n/c >> 115 >> 109, 149 >> n/c  - 2h after dinner:  180-210 >> 260 >> n/c >> 163, 183 >> n/c - bedtime: 208 >> ? >> 164, 173 >> 121 >> n/c >> 145 >> n/c - nighttime: n/c >> 108-138, 151 >> n/c Lowest sugar was 108 >> 115 >> 107 >> 80s;  he has hypoglycemia awareness in the 70s. Highest sugar was 261 (Prednisone) >> 183 >> 214.  Glucometer: [employer] Next >> AccuChek guide  In the past, he was occasional lemonade and ginger ale >> advised to stop - still drinking these but rarely.  -+ Mild CKD, last BUN/creatinine:  Lab Results  Component Value Date   BUN 8 06/20/2022   BUN 8 12/16/2021   CREATININE 0.89 06/20/2022   CREATININE 0.89 12/16/2021   -+ HL.  last set of lipids: Lab Results  Component Value Date   CHOL 161 06/20/2022   HDL 37 (L) 06/20/2022   LDLCALC 93 06/20/2022   LDLDIRECT 56.0 08/21/2020  TRIG 216 (H) 06/20/2022   CHOLHDL 4.4 06/20/2022  On pravastatin 40.  - last eye exam was in 2022: No DR reportedly.  Dr. Lucina Mellowoth.  - He has numbness and tingling in his feet, also pain.  Foot exam performed 09/10/2021.  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 Has varicose veins >> wears compresion hoses.He also has a h/o kidney stones.   On allopurinol for gout. He has anxiety, PTSD from childhood.  Early in 2022, he was diagnosed with an intradural extramedullary spinal tumor.  It did not change in appearance on a recent MRI. He had prostatitis and UTIs. He is on Tamsulozin (changed from terazosin) and finasteride.  He was on nitrofurantoin.  ROS: + See HPI.  Past Medical History:  Diagnosis Date   Abnormality of  gait 11/10/2012   Acute GI bleeding    Acute renal insufficiency 02/10/2015   Acute upper respiratory infection 08/10/2012   Allergic drug reaction 05/24/2020   Allergic rhinitis 10/16/2020   Allergy    Anemia 02/10/2015   Annual physical exam 11/26/2011   ANXIETY DEPRESSION 12/09/2007   Qualifier: Diagnosis of  By: Lillia MountainHudy, RN, Brien Fewegina G    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) 03/12/2018   Cerebrovascular accident, old 11/16/2014   Chest discomfort 06/08/2019   Chronic back pain 11/28/2020   Chronic cough 08/27/2014   CXR 08/2014:  Mild hyperinflation, no acute process Cleda DaubSpiro 09/2014:  Difficulty with procedure, but essentially normal +CVA's in past, with aspiration risk by history Speech evaluation 2016:      Chronic left shoulder pain 08/06/2020   Chronic pain syndrome 11/23/2019   Chronic pansinusitis 06/30/2018   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   Constipation    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   DDD (degenerative disc disease), cervical 04/26/2019   DDD (degenerative disc disease), lumbar 04/26/2019   Decreased hearing of both ears 02/12/2013   Deviated septum 05/31/2018   Diabetes mellitus without complication (HCC)    Difficulty urinating    DM (diabetes mellitus) type II uncontrolled, periph vascular disorder 01/02/2016   Dry skin 05/09/2019   Dysuria 11/29/2010   Essential (primary) hypertension 01/26/2015   Excessive daytime sleepiness 02/11/2018   FATIGUE 12/09/2007   Qualifier: Diagnosis of  By: Letta KocherHudy, RN,  Regina G    Fibromyalgia 03/02/2017   Food intolerance 05/09/2019   GERD 12/09/2007   Qualifier: Diagnosis of  By: Alphonzo SeveranceStafford Jr MD, Loni DollyWillie R    GERD (gastroesophageal reflux disease)    GI bleed 02/10/2015   Headache(784.0)    Hearing loss    Hemangioma 02/12/2013   Hemorrhoid 05/22/2011   HTN (hypertension) 02/04/2010   Qualifier: Diagnosis of  By: Abner GreenspanBlyth MD, Stacey     Hyperlipidemia    Hyperlipidemia associated with type 2 diabetes mellitus (HCC) 11/22/2020   Hyperlipidemia LDL goal <70 01/26/2015   Hypersomnia, persistent 11/05/2012   epworth of 18 points, was placed on CPAP  in 2008 with very low AHI ( no number  Mentioned) and struggles ever since with PAP. 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   Leg swelling    Localized swelling of both lower legs 03/02/2017   Low back pain 07/20/2007   Qualifier: Diagnosis of  By: Alphonzo Severance MD, Loni Dolly    Lower extremity edema 07/04/2020   Lower GI bleed    Melena 12/16/2021   Mixed conductive and sensorineural hearing loss of right ear with restricted hearing of left ear 10/16/2020   Nasal congestion    Neck pain 03/15/2020   NEPHROLITHIASIS 08/13/2006   Qualifier: Diagnosis of  By: Bradly Bienenstock     Nocturia more than twice per night 02/11/2018   OSA on CPAP 10/17/2014   Otalgia of both ears 06/06/2021   Other chronic pain 03/02/2017   Other fatigue 07/04/2020   Pain in finger of right hand 10/06/2012   Pain in joint of right shoulder 02/21/2017   Palpitations 01/26/2015   Paresthesia 07/25/2010   Persistent atrial fibrillation (HCC) 08/29/2020    Persistent headaches 10/17/2014   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   Qualifier: Diagnosis of  By: Alphonzo Severance MD, Ivar Drape R    PONV (postoperative nausea and vomiting)    Preoperative clearance 07/04/2020   Prolapsed hemorrhoids 10/05/2013   Radiculopathy, lumbar region 03/15/2020   Rash 05/24/2019   Rash and other nonspecific skin eruption 07/23/2022   Raynaud's disease 03/02/2017   Raynaud's phenomenon 03/02/2017   Rectal bleeding 02/12/2015   Rectal pain    Rectal ulcer with bleeding after hemorrhoid banding 02/11/2015   Rhinitis, chronic 12/23/2006   Qualifier: Diagnosis of  By: Lillia Mountain RN, Regina G    Right shoulder pain 02/15/2015   Seasonal and perennial allergic rhinitis 05/09/2019   Severe episode of recurrent major depressive disorder, 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   Sinus infection 06/30/2014   Sinusitis, acute, maxillary 02/02/2014   Sleep apnea    Sleep apnea with use of continuous positive airway pressure (CPAP) 02/11/2018   SOB (shortness of breath) 07/04/2020   Stroke (HCC)    Stroke (HCC)    Subacute confusional state 10/17/2014   SYMPTOM, HYPERSOMNIA NOS 02/19/2007   Qualifier: Diagnosis of  By: Vernie Murders     Thrombosed hemorrhoids    Tick bite of back 11/23/2019   Tinnitus of both ears 08/29/2020   Trouble swallowing    Unspecified hemorrhoids 02/10/2015   Unspecified sleep apnea 11/05/2012   UTI (urinary tract infection) 05/19/2022   Varicose veins of lower extremities with other complications 08/10/2012   Vasomotor rhinitis 06/28/2021   Vertigo 03/19/2012   Past Surgical History:  Procedure Laterality Date   CATARACT EXTRACTION     x 3   EYE SURGERY  1968, 1985, 1987   cataracts   FLEXIBLE SIGMOIDOSCOPY N/A 02/11/2015   Procedure: FLEXIBLE SIGMOIDOSCOPY;  Surgeon: Iva Boop, MD;  Location: [facility];  Service: Endoscopy;  Laterality: N/A;   INNER EAR SURGERY     rt ear   INNER EAR SURGERY  1992   surgery - right arm     1983    Social  History   Socioeconomic History   Marital status: Single    Spouse name: Not on file   Number of children: 0   Years of education: Not on file   Highest education level: Not on file  Occupational History   Not on file  Tobacco Use   Smoking status: Never   Smokeless tobacco: Never  Vaping Use   Vaping Use: Never used  Substance and Sexual Activity   Alcohol use: No    Alcohol/week: 0.0 standard drinks of alcohol    Comment: none   Drug use: No   Sexual activity: Never  Other Topics Concern   Not on file  Social History Narrative   No caffeine intake except for chocolate.  Exercised-walking   Social Determinants of Corporate investment banker Strain: Not on file  Food Insecurity: Not on file  Transportation Needs: Not on file  Physical Activity: Not on file  Stress: Not on file  Social Connections: Not on file  Intimate Partner Violence: Not on file   Current Outpatient Medications on File Prior to Visit  Medication Sig Dispense Refill   Accu-Chek Softclix Lancets lancets Use as instructed 1x a day 100 each PRN   allopurinol (ZYLOPRIM) 100 MG tablet TAKE 1 TABLET BY MOUTH TWICE A DAY 180 tablet 1   amLODipine (NORVASC) 2.5 MG tablet Take 1 tablet (2.5 mg total) by mouth daily. 90 tablet 2   Azelastine HCl (ASTEPRO) 0.15 % SOLN Place 1 spray into the nose in the morning and at bedtime. 1 mL 3   azithromycin (ZITHROMAX Z-PAK) 250 MG tablet As directed 6 each 0   BD PEN NEEDLE NANO 2ND GEN 32G X 4 MM MISC USE ONCE DAILY AS DIRECTED 100 each 3   clotrimazole-betamethasone (LOTRISONE) cream Apply 1 application topically as needed for rash.     Continuous Blood Gluc Receiver (FREESTYLE LIBRE 2 READER) DEVI 1 each by Does not apply route daily. 1 each 0   Continuous Blood Gluc Sensor (FREESTYLE LIBRE 2 SENSOR) MISC 1 each by Does not apply route every 14 (fourteen) days. 6 each 3   cyclobenzaprine (FLEXERIL) 10 MG tablet TAKE 1 TABLET BY MOUTH TWICE A DAY AS NEEDED  FOR MUSCLE SPASM 42 tablet 2   EPINEPHrine 0.3 mg/0.3 mL IJ SOAJ injection Inject 0.3 mg into the muscle as needed for anaphylaxis. 1 each 0   famotidine (PEPCID) 40 MG/5ML suspension Take 2.5 mLs (20 mg total) by mouth daily. 50 mL 2   FIBER SELECT GUMMIES PO Take 1 tablet by mouth daily.     finasteride (PROSCAR) 5 MG tablet Take 5 mg by mouth daily.     fluticasone (CUTIVATE) 0.05 % cream Apply 1 application topically 2 (two) times daily.     gabapentin (NEURONTIN) 100 MG capsule TAKE 1 CAPSULE BY MOUTH THREE TIMES A DAY 90 capsule 3   GAVILAX 17 GM/SCOOP powder TAKE 17 G BY MOUTH DAILY AS NEEDED. 238 g 5   glucose blood (ACCU-CHEK GUIDE) test strip Use as instructed 1x a day - 100 each PRN   hydrocortisone (ANUSOL-HC) 2.5 % rectal cream PLACE 1 APPLICATION RECTALLY 2 (TWO) TIMES DAILY. 30 g 2   hydrOXYzine (ATARAX) 25 MG tablet TAKE 1 TABLET (25 MG TOTAL) BY MOUTH EVERY 8 (EIGHT) HOURS AS NEEDED FOR ANXIETY OR ITCHING. 270 tablet 2   JARDIANCE 25 MG TABS tablet TAKE 1 TABLET (25 MG TOTAL) BY MOUTH DAILY. 90 tablet 3   losartan (COZAAR) 100 MG  tablet Take 1 tablet (100 mg total) by mouth daily. 90 tablet 1   metFORMIN (GLUCOPHAGE) 500 MG tablet Take 1 tablet (500 mg total) by mouth 2 (two) times daily with a meal. 180 tablet 3   metoprolol tartrate (LOPRESSOR) 50 MG tablet Take 1 tablet (50 mg total) by mouth daily. 90 tablet 1   OZEMPIC, 0.25 OR 0.5 MG/DOSE, 2 MG/3ML SOPN Inject 0.5 mg into the skin once a week.     pravastatin (PRAVACHOL) 20 MG tablet TAKE 2 TABLETS (40 MG TOTAL) BY MOUTH DAILY. 180 tablet 1   promethazine-dextromethorphan (PROMETHAZINE-DM) 6.25-15 MG/5ML syrup Take 5 mLs by mouth 4 (four) times daily as needed. 118 mL 0   tamsulosin (FLOMAX) 0.4 MG CAPS capsule Take 0.4 mg by mouth daily.     terazosin (HYTRIN) 5 MG capsule Take 5 mg by mouth daily.     triamcinolone (NASACORT) 55 MCG/ACT AERO nasal inhaler Place 2 sprays into the nose daily. 1 each 12   venlafaxine XR  (EFFEXOR-XR) 75 MG 24 hr capsule Take 1 capsule (75 mg total) by mouth daily with breakfast. 90 capsule 1   No current facility-administered medications on file prior to visit.   Allergies  Allergen Reactions   Amoxicillin Hives, Itching and Other (See Comments)    White tongue; ? hives   Sulfa Antibiotics Hives and Rash   Terfenadine     ? reaction   Family History  Problem Relation Age of Onset   Breast cancer Mother 45       breast   Huntington's disease Mother    Stroke Father    Heart disease Father    Hypertension Father    Heart attack Father    Hypertension Brother    Hyperlipidemia Neg Hx    Diabetes Neg Hx    PE: There were no vitals taken for this visit. Wt Readings from Last 3 Encounters:  07/30/22 175 lb 1.9 oz (79.4 kg)  06/20/22 176 lb 9.6 oz (80.1 kg)  05/20/22 176 lb 6.4 oz (80 kg)   Constitutional: Slightly overweight, in NAD Eyes: EOMI, no exophthalmos ENT: no thyromegaly, no cervical lymphadenopathy Cardiovascular: tachycardia, RR, No MRG, + swelling and pain on palpation in B LE  -wears compression hoses Respiratory: CTA B Musculoskeletal: no deformities Skin: no rashes Neurological: no tremor with outstretched hands Diabetic Foot Exam - Simple   Simple Foot Form Diabetic Foot exam was performed with the following findings: Yes 09/19/2022  1:24 PM  Visual Inspection No deformities, no ulcerations, no other skin breakdown bilaterally: Yes Sensation Testing Intact to touch and monofilament testing bilaterally: Yes Pulse Check Posterior Tibialis and Dorsalis pulse intact bilaterally: Yes Comments B varicosities      ASSESSMENT: 1. DM2, non-insulin-dependent, uncontrolled, with complications - PVD - cerebro-vascular ds - s/p "mini"strokes - PN  2. HL  PLAN:  1. Patient with history of uncontrolled diabetes, on metformin, SGLT2 inhibitor, and weekly GLP-1 receptor agonist, with improved control after switching from Januvia to Victoza and  then to Ozempic.  His HbA1c levels are excellent, down to 6.5%, however, at last visit, he does not check his sugars frequently but whenever checked, they were above target, in the 200s and HbA1c returned elevated, at 8.2%.  At that time, he was drinking juice and I strongly advised him to stop this or any other sweet drinks.  We also discussed about moving Jardiance before dinner as he felt that his sugars were send after he moved Jardiance before breakfast.  He did this ingestion with Ozempic when drinking water so we did not move the dose up at that time. -His insurance did not cover CGM despite him having Raynaud's syndrome and having completely reliable fingersticks and despite multiple requests. I tried again to send a prescription for this to his pharmacy in 2023 >> was not able to get it.  -At today's visit, sugars remain above target, but they are improved from last visit.  Upon questioning, however, he continues to drink juice, 1-2 large glasses a day.  I strongly advised him to stop.  He cannot drink water due to indigestion but he also has Gatorade 0 with him we discussed that he may drink this instead.  For now, we will not change his regimen but I did advise him to try Jardiance in the morning, as he did not feel a difference when he tried to take it in the evening -His meter gives him meaningless report, listing all of his sugars and 1 long column.  He asked me about other meters that give better records and I suggested that the [employer] order One Touch Verio reflect -he will look into these. - I suggested to:  Patient Instructions  Please continue: - Metformin 500 mg 1x a day - Jardiance 25 mg before b'fast - Ozempic  0.5 mg weekly  STOP JUICE OR OTHER SWEET DRINKS!  Check some sugars at bedtime.  Look into [employer], [employer], research (life sciences)Touch Verio Reflect.  Please return in 4 months with your sugar log.   - we checked his HbA1c: 7.8% (lower) - advised to check sugars at different times of the day  - 1x a day, rotating check times - advised for yearly eye exams >> he is UTD - return to clinic in 4 months  2. HL -Reviewed latest lipid panel from 06/2022: LDL increased, triglycerides also high, HDL low: Lab Results  Component Value Date   CHOL 161 06/20/2022   HDL 37 (L) 06/20/2022   LDLCALC 93 06/20/2022   LDLDIRECT 56.0 08/21/2020   TRIG 216 (H) 06/20/2022   CHOLHDL 4.4 06/20/2022  -He continues on pravastatin 40 mg daily without side effects  Carlus Pavlovristina Alliene Klugh, MD PhD [facility]

## 2022-09-22 ENCOUNTER — Encounter: Payer: Self-pay | Admitting: Internal Medicine

## 2022-09-26 ENCOUNTER — Telehealth: Payer: Self-pay

## 2022-09-26 ENCOUNTER — Other Ambulatory Visit (HOSPITAL_COMMUNITY): Payer: Self-pay

## 2022-09-26 NOTE — Telephone Encounter (Signed)
Patient Advocate Encounter   Received notification from pt msgs that prior authorization is required for Jardiance 25MG  tablets  Submitted: 09/26/22 Key [id number]  Status is pending

## 2022-09-29 ENCOUNTER — Other Ambulatory Visit (HOSPITAL_COMMUNITY): Payer: Self-pay

## 2022-09-29 NOTE — Telephone Encounter (Signed)
Pharmacy Patient Advocate Encounter  Prior Authorization for [place] has been approved by [facility] or [place] ([employer]) (ins).    PA # [id number] Effective dates: 09/26/22 through 09/26/23  Copay is $4 (90 day supply per test claim) Will call the pharmacy when they open. Will go ahead and msg pt to let him know, since I can't call the pharmacy yet.

## 2022-10-06 ENCOUNTER — Other Ambulatory Visit: Payer: Self-pay | Admitting: Family Medicine

## 2022-10-13 DIAGNOSIS — N401 Enlarged prostate with lower urinary tract symptoms: Secondary | ICD-10-CM | POA: Diagnosis not present

## 2022-10-13 DIAGNOSIS — N312 Flaccid neuropathic bladder, not elsewhere classified: Secondary | ICD-10-CM | POA: Diagnosis not present

## 2022-10-19 ENCOUNTER — Other Ambulatory Visit: Payer: Self-pay | Admitting: Family Medicine

## 2022-11-03 ENCOUNTER — Other Ambulatory Visit: Payer: Self-pay | Admitting: Family Medicine

## 2022-11-03 DIAGNOSIS — K649 Unspecified hemorrhoids: Secondary | ICD-10-CM

## 2022-11-18 ENCOUNTER — Other Ambulatory Visit: Payer: Self-pay | Admitting: Family Medicine

## 2022-12-04 ENCOUNTER — Other Ambulatory Visit: Payer: Self-pay | Admitting: Podiatry

## 2022-12-09 ENCOUNTER — Other Ambulatory Visit: Payer: Self-pay | Admitting: Podiatry

## 2022-12-16 ENCOUNTER — Other Ambulatory Visit: Payer: Self-pay | Admitting: Family Medicine

## 2022-12-16 DIAGNOSIS — E785 Hyperlipidemia, unspecified: Secondary | ICD-10-CM

## 2022-12-22 ENCOUNTER — Ambulatory Visit (HOSPITAL_BASED_OUTPATIENT_CLINIC_OR_DEPARTMENT_OTHER)
Admission: RE | Admit: 2022-12-22 | Discharge: 2022-12-22 | Disposition: A | Discharge status: Home / Self Care | Payer: Medicaid Other | Source: Ambulatory Visit | Attending: Family Medicine | Admitting: Family Medicine

## 2022-12-22 ENCOUNTER — Encounter: Payer: Self-pay | Admitting: Family Medicine

## 2022-12-22 ENCOUNTER — Ambulatory Visit (INDEPENDENT_AMBULATORY_CARE_PROVIDER_SITE_OTHER): Payer: Medicaid Other | Admitting: Family Medicine

## 2022-12-22 VITALS — BP 112/80 | HR 87 | Temp 98.9°F | Resp 18 | Ht 69.0 in | Wt 176.6 lb

## 2022-12-22 DIAGNOSIS — I10 Essential (primary) hypertension: Secondary | ICD-10-CM

## 2022-12-22 DIAGNOSIS — R051 Acute cough: Secondary | ICD-10-CM | POA: Insufficient documentation

## 2022-12-22 DIAGNOSIS — E1169 Type 2 diabetes mellitus with other specified complication: Secondary | ICD-10-CM

## 2022-12-22 DIAGNOSIS — E785 Hyperlipidemia, unspecified: Secondary | ICD-10-CM

## 2022-12-22 DIAGNOSIS — G8929 Other chronic pain: Secondary | ICD-10-CM

## 2022-12-22 DIAGNOSIS — G894 Chronic pain syndrome: Secondary | ICD-10-CM

## 2022-12-22 DIAGNOSIS — Z0001 Encounter for general adult medical examination with abnormal findings: Secondary | ICD-10-CM | POA: Diagnosis not present

## 2022-12-22 DIAGNOSIS — J0140 Acute pansinusitis, unspecified: Secondary | ICD-10-CM | POA: Diagnosis not present

## 2022-12-22 DIAGNOSIS — E1165 Type 2 diabetes mellitus with hyperglycemia: Secondary | ICD-10-CM | POA: Diagnosis not present

## 2022-12-22 DIAGNOSIS — R059 Cough, unspecified: Secondary | ICD-10-CM | POA: Diagnosis not present

## 2022-12-22 DIAGNOSIS — Z0000 Encounter for general adult medical examination without abnormal findings: Secondary | ICD-10-CM

## 2022-12-22 DIAGNOSIS — Z1211 Encounter for screening for malignant neoplasm of colon: Secondary | ICD-10-CM

## 2022-12-22 DIAGNOSIS — N400 Enlarged prostate without lower urinary tract symptoms: Secondary | ICD-10-CM

## 2022-12-22 MED ORDER — GABAPENTIN 100 MG PO CAPS
ORAL_CAPSULE | ORAL | 3 refills | Status: DC
Start: 2022-12-22 — End: 2023-01-15

## 2022-12-22 MED ORDER — DOXYCYCLINE HYCLATE 100 MG PO TABS
100.0000 mg | ORAL_TABLET | Freq: Two times a day (BID) | ORAL | 0 refills | Status: DC
Start: 2022-12-22 — End: 2023-01-15

## 2022-12-22 MED ORDER — AZELASTINE HCL 0.1 % NA SOLN: 1.0000 | Freq: Two times a day (BID) | NASAL | 12 refills | Status: DC

## 2022-12-22 NOTE — Assessment & Plan Note (Signed)
[facility] UTD CHECK LABS  SEE AVS  Health Maintenance  Topic Date Due   COVID-19 Vaccine (1) Never done   HIV Screening  Never done   Colonoscopy  Never done   OPHTHALMOLOGY EXAM  03/03/2017   Diabetic kidney evaluation - Urine ACR  05/24/2021   Zoster Vaccines- Shingrix (2 of 2) 02/25/2022   INFLUENZA VACCINE  01/15/2023   HEMOGLOBIN A1C  03/21/2023   Diabetic kidney evaluation - eGFR measurement  06/21/2023   FOOT EXAM  09/19/2023   DTaP/Tdap/Td (3 - Tdap) 08/21/2029   Hepatitis C Screening  Completed   HPV VACCINES  Aged Out

## 2022-12-22 NOTE — Progress Notes (Signed)
Established Patient Office Visit  Subjective   Patient ID: Chase [name], male    DOB: December 01, 1960  Age: 62 y.o. MRN: [id number]  Chief Complaint  Patient presents with   Annual Exam    Pt states not fasting     HPI Discussed the use of AI scribe software for clinical note transcription with the patient, who gave verbal consent to proceed.  History of Present Illness   The patient presents with a three-week history of symptoms consistent with a sinus infection, including headaches, ear pressure, and fever. He has been self-medicating with over-the-counter remedies such as Dayquil and Nyquil. The patient also reports a history of dental issues, including ill-fitting dentures due to insufficient bone structure in the lower jaw. He is considering dental implants but is deterred by the cost. The patient also reports occasional tenderness in the stomach, which comes and goes. He has been using Asthapro for nasal congestion, which he believes is due to allergies. The patient also reports a sensation of food or medicine going down into his lungs, causing him to cough. He has a history of COPD but is unsure if it has returned. The patient also mentions a history of pain management, which was discontinued when his previous provider retired.      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   Increasing residual urine 03/10/2022   Melena 12/16/2021   Vasomotor rhinitis 06/28/2021   Otalgia of both ears 06/06/2021   Brain cyst 01/09/2021   Chronic back pain 11/28/2020   Hyperlipidemia associated with type 2 diabetes mellitus (HCC) 11/22/2020   Mixed conductive and sensorineural hearing loss of right ear with restricted hearing of left ear 10/16/2020   Allergic rhinitis 10/16/2020   Constipation    Hearing loss    Severe episode of recurrent major depressive  disorder, 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   Hypokalemia 08/29/2020   BPH without urinary obstruction 08/29/2020   Tinnitus of both ears 08/29/2020   Persistent atrial fibrillation (HCC) 08/29/2020   Chronic left shoulder pain 08/06/2020   Allergy    Arthritis    Blood in stool    Diabetes mellitus without complication (HCC)    GERD (gastroesophageal reflux disease)    Hypertension    Leg swelling    Nasal congestion    PONV (postoperative nausea and vomiting)    Rectal pain    Thrombosed hemorrhoids    Trouble swallowing    Lower extremity edema 07/04/2020   Other fatigue 07/04/2020   Preoperative clearance 07/04/2020   Hypotension 07/04/2020   SOB (shortness of breath) 07/04/2020   Allergic drug reaction 05/24/2020   Body mass index (BMI) 26.0-26.9, adult 03/15/2020   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   Radiculopathy, lumbar region 03/15/2020   Neck pain 03/15/2020   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/23/2019   Hyperlipidemia 11/23/2019   Bilateral hearing loss 11/23/2019   Tick bite of back 11/23/2019   Chronic pain syndrome 11/23/2019   Chest discomfort 06/08/2019   Benign neoplasm of brain (HCC) 05/24/2019   Rash 05/24/2019   Dry skin 05/09/2019   Seasonal and perennial allergic rhinitis 05/09/2019   Food intolerance 05/09/2019   DDD (degenerative disc disease), cervical 04/26/2019   DDD (degenerative disc disease), lumbar 04/26/2019   Chronic pansinusitis 06/30/2018   Deviated septum 05/31/2018   Insect bite  03/16/2018   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) 03/12/2018   Excessive daytime sleepiness 02/11/2018   Sleep apnea with use of continuous positive airway pressure (CPAP) 02/11/2018   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   Nocturia more than twice per night 02/11/2018   Bilateral leg and foot pain 03/02/2017   Fibromyalgia 03/02/2017   Localized swelling of both lower legs  03/02/2017   Other chronic pain 03/02/2017   Raynaud's disease 03/02/2017   Raynaud's phenomenon 03/02/2017   Pain in joint of right shoulder 02/21/2017   DM (diabetes mellitus) type II uncontrolled, periph vascular disorder 01/02/2016   Right shoulder pain 02/15/2015   Rectal bleeding 02/12/2015   Rectal ulcer with bleeding after hemorrhoid banding 02/11/2015   Lower GI bleed    Blood in stool, frank 02/10/2015   Acute renal insufficiency 02/10/2015   Unspecified hemorrhoids 02/10/2015   GI bleed 02/10/2015   Acute GI bleeding    Stroke (HCC) 01/26/2015   Palpitations 01/26/2015   Essential (primary) hypertension 01/26/2015   Hyperlipidemia LDL goal <70 01/26/2015   Cerebrovascular accident, old 11/16/2014   Internal hemorrhoids 10/22/2014   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 10/17/2014   OSA on CPAP 10/17/2014   Persistent headaches 10/17/2014   Subacute confusional state 10/17/2014   Chronic cough 08/27/2014   Sinus infection 06/30/2014   Sinusitis, acute, maxillary 02/02/2014   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   Prolapsed hemorrhoids 10/05/2013   Hemangioma 02/12/2013   Decreased hearing of both ears 02/12/2013   Abnormality of gait 11/10/2012   Hypersomnia, persistent 11/05/2012   Unspecified sleep apnea 11/05/2012   Pain in finger of right hand 10/06/2012   Internal and external hemorrhoids without complication 10/04/2012   Varicose veins of lower extremities with other complications 08/10/2012   Acute upper respiratory infection 08/10/2012   Bigeminy ? 03/22/2012   Vertigo 03/19/2012   Preventative health care 11/26/2011   Sleep apnea 11/03/2011   Insomnia 08/05/2011   Difficulty urinating    Hemorrhoid 05/22/2011   Dysuria 11/29/2010   Arthralgia 07/25/2010   Paresthesia 07/25/2010   HTN (hypertension) 02/04/2010   ANXIETY DEPRESSION 12/09/2007   GERD 12/09/2007   FATIGUE 12/09/2007   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   Low  back pain 07/20/2007   SYMPTOM, HYPERSOMNIA NOS 02/19/2007   Rhinitis, chronic 12/23/2006   HEADACHE 12/23/2006   NEPHROLITHIASIS 08/13/2006   Past Medical History:  Diagnosis Date   Abnormality of gait 11/10/2012   Acute GI bleeding    Acute renal insufficiency 02/10/2015   Acute upper respiratory infection 08/10/2012   Allergic drug reaction 05/24/2020   Allergic rhinitis 10/16/2020   Allergy    Anemia 02/10/2015   Annual physical exam 11/26/2011   ANXIETY DEPRESSION 12/09/2007   Qualifier: Diagnosis of  By: Lillia Mountain RN, Brien Few    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) 03/12/2018   Cerebrovascular accident, old 11/16/2014   Chest discomfort 06/08/2019   Chronic back pain 11/28/2020   Chronic cough 08/27/2014  CXR 08/2014:  Mild hyperinflation, no acute process Cleda Daub 09/2014:  Difficulty with procedure, but essentially normal +CVA's in past, with aspiration risk by history Speech evaluation 2016:      Chronic left shoulder pain 08/06/2020   Chronic pain syndrome 11/23/2019   Chronic pansinusitis 06/30/2018   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   Constipation    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   DDD (degenerative disc disease), cervical 04/26/2019   DDD (degenerative disc disease), lumbar 04/26/2019   Decreased hearing of both ears 02/12/2013   Deviated septum 05/31/2018   Diabetes mellitus without complication (HCC)    Difficulty urinating    DM (diabetes mellitus) type II uncontrolled, periph vascular disorder  01/02/2016   Dry skin 05/09/2019   Dysuria 11/29/2010   Essential (primary) hypertension 01/26/2015   Excessive daytime sleepiness 02/11/2018   FATIGUE 12/09/2007   Qualifier: Diagnosis of  By: Letta Kocher    Fibromyalgia 03/02/2017   Food intolerance 05/09/2019   GERD 12/09/2007   Qualifier: Diagnosis of  By: Alphonzo Severance MD, Loni Dolly    GERD (gastroesophageal reflux disease)    GI bleed 02/10/2015   Headache(784.0)    Hearing loss    Hemangioma 02/12/2013   Hemorrhoid 05/22/2011   HTN (hypertension) 02/04/2010   Qualifier: Diagnosis of  By: Abner Greenspan MD, Stacey     Hyperlipidemia    Hyperlipidemia associated with type 2 diabetes mellitus (HCC) 11/22/2020   Hyperlipidemia LDL goal <70 01/26/2015   Hypersomnia, persistent 11/05/2012   epworth of 18 points, was placed on CPAP in 2008 with very low AHI ( no number  Mentioned) and struggles ever since with PAP. 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   Leg swelling    Localized swelling of both lower legs 03/02/2017   Low back pain 07/20/2007   Qualifier: Diagnosis of  By: Alphonzo Severance MD, Loni Dolly    Lower extremity edema 07/04/2020   Lower GI bleed    Melena 12/16/2021   Mixed conductive and sensorineural hearing loss of right ear with restricted hearing of left ear 10/16/2020   Nasal congestion    Neck pain 03/15/2020   NEPHROLITHIASIS 08/13/2006   Qualifier: Diagnosis of  By: Bradly Bienenstock     Nocturia more than twice per night 02/11/2018   OSA on CPAP 10/17/2014   Otalgia of both ears 06/06/2021   Other chronic pain 03/02/2017   Other fatigue  07/04/2020   Pain in finger of right hand 10/06/2012   Pain in joint of right shoulder 02/21/2017   Palpitations 01/26/2015   Paresthesia 07/25/2010   Persistent atrial fibrillation (HCC) 08/29/2020   Persistent headaches 10/17/2014   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   Qualifier: Diagnosis of  By: Alphonzo Severance MD, Ivar Drape R    PONV (postoperative nausea and vomiting)    Preoperative clearance 07/04/2020   Prolapsed hemorrhoids 10/05/2013   Radiculopathy, lumbar region 03/15/2020   Rash 05/24/2019   Rash and other nonspecific skin eruption 07/23/2022   Raynaud's disease 03/02/2017   Raynaud's phenomenon 03/02/2017   Rectal bleeding 02/12/2015   Rectal pain    Rectal  ulcer with bleeding after hemorrhoid banding 02/11/2015   Rhinitis, chronic 12/23/2006   Qualifier: Diagnosis of  By: Lillia Mountain RN, Regina G    Right shoulder pain 02/15/2015   Seasonal and perennial allergic rhinitis 05/09/2019   Severe episode of recurrent major depressive disorder, 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   Sinus infection 06/30/2014   Sinusitis, acute, maxillary 02/02/2014   Sleep apnea    Sleep apnea with use of continuous positive airway pressure (CPAP) 02/11/2018   SOB (shortness of breath) 07/04/2020   Stroke (HCC)    Stroke (HCC)    Subacute confusional state 10/17/2014   SYMPTOM, HYPERSOMNIA NOS 02/19/2007   Qualifier: Diagnosis of  By: Vernie Murders     Thrombosed hemorrhoids    Tick bite of back 11/23/2019   Tinnitus of both ears 08/29/2020   Trouble swallowing    Unspecified hemorrhoids 02/10/2015   Unspecified sleep apnea 11/05/2012   UTI (urinary tract infection) 05/19/2022   Varicose veins of lower extremities with other complications 08/10/2012   Vasomotor rhinitis 06/28/2021   Vertigo 03/19/2012   Past Surgical History:  Procedure Laterality Date   CATARACT EXTRACTION     x 3   EYE SURGERY  1968, 1985, 1987   cataracts   FLEXIBLE SIGMOIDOSCOPY N/A 02/11/2015   Procedure:  FLEXIBLE SIGMOIDOSCOPY;  Surgeon: Iva Boop, MD;  Location: [facility];  Service: Endoscopy;  Laterality: N/A;   INNER EAR SURGERY     rt ear   INNER EAR SURGERY  1992   surgery - right arm     1983   Social History   Tobacco Use   Smoking status: Never   Smokeless tobacco: Never  Vaping Use   Vaping Use: Never used  Substance Use Topics   Alcohol use: No    Alcohol/week: 0.0 standard drinks of alcohol    Comment: none   Drug use: No   Social History   Socioeconomic History   Marital status: Single    Spouse name: Not on file   Number of children: 0   Years of education: Not on file   Highest education level: Not on file  Occupational History   Not on file  Tobacco Use   Smoking status: Never   Smokeless tobacco: Never  Vaping Use   Vaping Use: Never used  Substance and Sexual Activity   Alcohol use: No    Alcohol/week: 0.0 standard drinks of alcohol    Comment: none   Drug use: No   Sexual activity: Never  Other Topics Concern   Not on file  Social History Narrative   No caffeine intake except for chocolate.  Exercised-less walking    Social Determinants of Corporate investment banker Strain: Not on file  Food Insecurity: Not on file  Transportation Needs: Not on file  Physical Activity: Not on file  Stress: Not on file  Social Connections: Not on file  Intimate Partner Violence: Not on file   Family Status  Relation Name Status   Mother  Deceased at age 57   Father  Deceased at age 90   Brother 1 Alive   Neg Hx  (Not Specified)   Family History  Problem Relation Age of Onset   Breast cancer Mother 57       breast   Huntington's disease Mother    Stroke Father    Heart disease Father    Hypertension Father    Heart attack Father    Hypertension Brother  Hyperlipidemia Neg Hx    Diabetes Neg Hx    Allergies  Allergen Reactions   Amoxicillin Hives, Itching and Other (See Comments)    White tongue; ? hives   Sulfa Antibiotics  Hives and Rash   Terfenadine     ? reaction      Review of Systems  Constitutional:  Negative for fever and malaise/fatigue.  HENT:  Negative for congestion.   Eyes:  Negative for blurred vision.  Respiratory:  Negative for shortness of breath.   Cardiovascular:  Negative for chest pain, palpitations and leg swelling.  Gastrointestinal:  Negative for abdominal pain, blood in stool and nausea.  Genitourinary:  Negative for dysuria and frequency.  Musculoskeletal:  Negative for falls.  Skin:  Negative for rash.  Neurological:  Negative for dizziness, loss of consciousness and headaches.  Endo/Heme/Allergies:  Negative for environmental allergies.  Psychiatric/Behavioral:  Negative for depression. The patient is not nervous/anxious.       Objective:     BP 112/80 (BP Location: Left Arm, Patient Position: Sitting, Cuff Size: Normal)   Pulse 87   Temp 98.9 F (37.2 C) (Oral)   Resp 18   Ht 5\' 9"  (1.753 m)   Wt 176 lb 9.6 oz (80.1 kg)   SpO2 96%   BMI 26.08 kg/m  BP Readings from Last 3 Encounters:  12/22/22 112/80  09/19/22 120/80  07/30/22 116/70   Wt Readings from Last 3 Encounters:  12/22/22 176 lb 9.6 oz (80.1 kg)  09/19/22 174 lb 3.2 oz (79 kg)  07/30/22 175 lb 1.9 oz (79.4 kg)   SpO2 Readings from Last 3 Encounters:  12/22/22 96%  09/19/22 98%  07/30/22 97%      Physical Exam Vitals and nursing note reviewed.  Constitutional:      General: He is not in acute distress.    Appearance: Normal appearance. He is well-developed.  HENT:     Head: Normocephalic and atraumatic.     Right Ear: Tympanic membrane, ear canal and external ear normal. There is no impacted cerumen.     Left Ear: Tympanic membrane, ear canal and external ear normal. There is no impacted cerumen.     Nose: Nose normal.     Mouth/Throat:     Mouth: Mucous membranes are moist.     Pharynx: Oropharynx is clear. No oropharyngeal exudate or posterior oropharyngeal erythema.  Eyes:      General: No scleral icterus.       Right eye: No discharge.        Left eye: No discharge.     Conjunctiva/sclera: Conjunctivae normal.     Pupils: Pupils are equal, round, and reactive to light.  Neck:     Thyroid: No thyromegaly.     Vascular: No JVD.  Cardiovascular:     Rate and Rhythm: Normal rate and regular rhythm.     Heart sounds: Normal heart sounds. No murmur heard. Pulmonary:     Effort: Pulmonary effort is normal. No respiratory distress.     Breath sounds: Normal breath sounds.  Abdominal:     General: Bowel sounds are normal. There is no distension.     Palpations: Abdomen is soft. There is no mass.     Tenderness: There is no abdominal tenderness. There is no guarding or rebound.  Musculoskeletal:        General: Normal range of motion.     Cervical back: Normal range of motion and neck supple.     Right lower  leg: No edema.     Left lower leg: No edema.  Lymphadenopathy:     Cervical: No cervical adenopathy.  Skin:    General: Skin is warm and dry.     Findings: No erythema or rash.  Neurological:     Mental Status: He is alert and oriented to person, place, and time.     Cranial Nerves: No cranial nerve deficit.     Motor: No abnormal muscle tone.     Deep Tendon Reflexes: Reflexes are normal and symmetric. Reflexes normal.  Psychiatric:        Mood and Affect: Mood normal.        Behavior: Behavior normal.        Thought Content: Thought content normal.        Judgment: Judgment normal.      No results found for any visits on 12/22/22.  Last CBC Lab Results  Component Value Date   WBC 8.3 06/20/2022   HGB 13.6 06/20/2022   HCT 39.2 06/20/2022   MCV 87.5 06/20/2022   MCH 30.4 06/20/2022   RDW 12.7 06/20/2022   PLT 364 06/20/2022   Last metabolic panel Lab Results  Component Value Date   GLUCOSE 143 (H) 06/20/2022   NA 141 06/20/2022   K 4.3 06/20/2022   CL 99 06/20/2022   CO2 30 06/20/2022   BUN 8 06/20/2022   CREATININE 0.89  06/20/2022   GFR 92.62 12/16/2021   CALCIUM 9.8 06/20/2022   PHOS 3.5 02/11/2015   PROT 6.8 06/20/2022   ALBUMIN 4.4 12/16/2021   BILITOT 0.6 06/20/2022   ALKPHOS 73 12/16/2021   AST 21 06/20/2022   ALT 28 06/20/2022   ANIONGAP 9 02/12/2015   Last lipids Lab Results  Component Value Date   CHOL 161 06/20/2022   HDL 37 (L) 06/20/2022   LDLCALC 93 06/20/2022   LDLDIRECT 56.0 08/21/2020   TRIG 216 (H) 06/20/2022   CHOLHDL 4.4 06/20/2022   Last hemoglobin A1c Lab Results  Component Value Date   HGBA1C 7.8 (A) 09/19/2022   Last thyroid functions Lab Results  Component Value Date   TSH 3.25 06/20/2022   Last vitamin D No results found for: "25OHVITD2", "25OHVITD3", "VD25OH" Last vitamin B12 and Folate Lab Results  Component Value Date   VITAMINB12 389 07/03/2020   FOLATE 22.6 02/10/2015      The ASCVD Risk score (Arnett DK, et al., 2019) failed to calculate for the following reasons:   The patient has a prior MI or stroke diagnosis    Assessment & Plan:   Problem List Items Addressed This Visit       Unprioritized   Sinus infection   Relevant Medications   azelastine (ASTELIN) 0.1 % nasal spray   doxycycline (VIBRA-TABS) 100 MG tablet   Other chronic pain   Relevant Medications   gabapentin (NEURONTIN) 100 MG capsule   Other Relevant Orders   Ambulatory referral to Pain Clinic   BPH without urinary obstruction   Relevant Orders   PSA   Hyperlipidemia associated with type 2 diabetes mellitus (HCC)   Relevant Orders   Lipid panel   Comprehensive metabolic panel   CBC with Differential/Platelet   HTN (hypertension)   Relevant Orders   TSH   Preventative health care - Primary    [facility] UTD CHECK LABS  SEE AVS  Health Maintenance  Topic Date Due   COVID-19 Vaccine (1) Never done   HIV Screening  Never done   Colonoscopy  Never done   OPHTHALMOLOGY EXAM  03/03/2017   Diabetic kidney evaluation - Urine ACR  05/24/2021   Zoster Vaccines- Shingrix (2  of 2) 02/25/2022   INFLUENZA VACCINE  01/15/2023   HEMOGLOBIN A1C  03/21/2023   Diabetic kidney evaluation - eGFR measurement  06/21/2023   FOOT EXAM  09/19/2023   DTaP/Tdap/Td (3 - Tdap) 08/21/2029   Hepatitis C Screening  Completed   HPV VACCINES  Aged Out        Chronic pain syndrome    Pt had been seeing Dr Jordan Likes --- he retired so pt needs referral for different office       Relevant Medications   gabapentin (NEURONTIN) 100 MG capsule   Other Visit Diagnoses     Type 2 diabetes mellitus with hyperglycemia, without long-term current use of insulin (HCC)       Relevant Orders   Comprehensive metabolic panel   CBC with Differential/Platelet   Acute cough       Relevant Orders   DG Chest 2 View   Colon cancer screening       Relevant Orders   Ambulatory referral to Gastroenterology     Assessment and Plan    Sinusitis: Symptoms of headache, ear pressure, and fever for three weeks. Over-the-counter medications have been ineffective. -Order chest X-ray to rule out aspiration. -Prescribe Doxycycline. -Continue Astelin nasal spray twice daily.  Chronic Pain Management: Previous pain management physician retired and current practice has changed his approach. Patient reports being cut off from pain medication. -Review previous pain management records and referrals. -Consider referral to a new pain management specialist in the [place] area.  Dental Health: Patient has dentures but reports inability to wear them due to lack of bone structure. Implants suggested but cost prohibitive. -No immediate plan. Continue to monitor.  General Health Maintenance: -Continue with scheduled appointments with Dr. Particia Lather and the urologist. -Encourage patient to schedule eye doctor appointment. -Order routine blood work as part of physical examination.        No follow-ups on file.    Donato Schultz, DO

## 2022-12-22 NOTE — Assessment & Plan Note (Signed)
Pt had been seeing Dr Jordan Likes --- he retired so pt needs referral for different office

## 2022-12-22 NOTE — Patient Instructions (Signed)

## 2022-12-23 LAB — CBC WITH DIFFERENTIAL/PLATELET
Basophils Absolute: 0.1 10*3/uL (ref 0.0–0.1)
Basophils Relative: 0.8 % (ref 0.0–3.0)
Eosinophils Absolute: 0.3 10*3/uL (ref 0.0–0.7)
Eosinophils Relative: 3.6 % (ref 0.0–5.0)
HCT: 41.1 % (ref 39.0–52.0)
Hemoglobin: 13.4 g/dL (ref 13.0–17.0)
Lymphocytes Relative: 21.5 % (ref 12.0–46.0)
Lymphs Abs: 1.8 10*3/uL (ref 0.7–4.0)
MCHC: 32.6 g/dL (ref 30.0–36.0)
MCV: 90.4 fl (ref 78.0–100.0)
Monocytes Absolute: 0.6 10*3/uL (ref 0.1–1.0)
Monocytes Relative: 7 % (ref 3.0–12.0)
Neutro Abs: 5.6 10*3/uL (ref 1.4–7.7)
Neutrophils Relative %: 67.1 % (ref 43.0–77.0)
Platelets: 316 10*3/uL (ref 150.0–400.0)
RBC: 4.54 Mil/uL (ref 4.22–5.81)
RDW: 13.1 % (ref 11.5–15.5)
WBC: 8.4 10*3/uL (ref 4.0–10.5)

## 2022-12-23 LAB — TSH: TSH: 2.47 u[IU]/mL (ref 0.35–5.50)

## 2022-12-23 LAB — LIPID PANEL
Cholesterol: 134 mg/dL (ref 0–200)
HDL: 27.5 mg/dL — ABNORMAL LOW (ref >39.00)
NonHDL: 106.66
Total CHOL/HDL Ratio: 5
Triglycerides: 281 mg/dL — ABNORMAL HIGH (ref 0.0–149.0)
VLDL: 56.2 mg/dL — ABNORMAL HIGH (ref 0.0–40.0)

## 2022-12-23 LAB — COMPREHENSIVE METABOLIC PANEL
ALT: 27 U/L (ref 0–53)
AST: 22 U/L (ref 0–37)
Albumin: 4.3 g/dL (ref 3.5–5.2)
Alkaline Phosphatase: 80 U/L (ref 39–117)
BUN: 8 mg/dL (ref 6–23)
CO2: 32 mEq/L (ref 19–32)
Calcium: 9.6 mg/dL (ref 8.4–10.5)
Chloride: 101 mEq/L (ref 96–112)
Creatinine, Ser: 0.95 mg/dL (ref 0.40–1.50)
GFR: 85.89 mL/min (ref >60.00)
Glucose, Bld: 145 mg/dL — ABNORMAL HIGH (ref 70–99)
Potassium: 3.9 mEq/L (ref 3.5–5.1)
Sodium: 142 mEq/L (ref 135–145)
Total Bilirubin: 0.7 mg/dL (ref 0.2–1.2)
Total Protein: 6.7 g/dL (ref 6.0–8.3)

## 2022-12-23 LAB — LDL CHOLESTEROL, DIRECT: Direct LDL: 59 mg/dL

## 2022-12-23 LAB — PSA: PSA: 0.52 ng/mL (ref 0.10–4.00)

## 2022-12-28 ENCOUNTER — Other Ambulatory Visit: Payer: Self-pay | Admitting: Family Medicine

## 2022-12-31 DIAGNOSIS — R39198 Other difficulties with micturition: Secondary | ICD-10-CM | POA: Diagnosis not present

## 2022-12-31 DIAGNOSIS — N401 Enlarged prostate with lower urinary tract symptoms: Secondary | ICD-10-CM | POA: Diagnosis not present

## 2023-01-03 ENCOUNTER — Other Ambulatory Visit: Payer: Self-pay | Admitting: Family Medicine

## 2023-01-03 DIAGNOSIS — F322 Major depressive disorder, single episode, severe without psychotic features: Secondary | ICD-10-CM

## 2023-01-05 ENCOUNTER — Other Ambulatory Visit: Payer: Self-pay | Admitting: Family Medicine

## 2023-01-05 DIAGNOSIS — K649 Unspecified hemorrhoids: Secondary | ICD-10-CM

## 2023-01-14 ENCOUNTER — Encounter: Payer: Self-pay | Admitting: Physical Medicine & Rehabilitation

## 2023-01-15 ENCOUNTER — Encounter: Payer: Self-pay | Admitting: Physical Medicine & Rehabilitation

## 2023-01-15 ENCOUNTER — Encounter: Payer: Medicaid Other | Attending: Physical Medicine & Rehabilitation | Admitting: Physical Medicine & Rehabilitation

## 2023-01-15 VITALS — BP 109/72 | HR 92 | Ht 69.0 in | Wt 174.8 lb

## 2023-01-15 DIAGNOSIS — Z823 Family history of stroke: Secondary | ICD-10-CM | POA: Insufficient documentation

## 2023-01-15 DIAGNOSIS — F341 Dysthymic disorder: Secondary | ICD-10-CM | POA: Insufficient documentation

## 2023-01-15 DIAGNOSIS — G894 Chronic pain syndrome: Secondary | ICD-10-CM | POA: Insufficient documentation

## 2023-01-15 DIAGNOSIS — M797 Fibromyalgia: Secondary | ICD-10-CM | POA: Insufficient documentation

## 2023-01-15 DIAGNOSIS — M545 Low back pain, unspecified: Secondary | ICD-10-CM | POA: Insufficient documentation

## 2023-01-15 DIAGNOSIS — G8929 Other chronic pain: Secondary | ICD-10-CM | POA: Diagnosis present

## 2023-01-15 MED ORDER — GABAPENTIN 100 MG PO CAPS: 200.0000 mg | ORAL_CAPSULE | Freq: Three times a day (TID) | ORAL | 3 refills | Status: DC

## 2023-01-15 NOTE — Progress Notes (Signed)
Subjective:    Patient ID: Chase [name], male    DOB: [birth year], 62 y.o.   MRN: [id number]  HPI HPI  Chase [name] is a 62 y.o. 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) (03/12/2018), Cerebrovascular accident, old (11/16/2014), Chest discomfort (06/08/2019), Chronic back pain (11/28/2020), Chronic cough (08/27/2014), Chronic left shoulder pain (08/06/2020), Chronic pain syndrome (11/23/2019), Chronic pansinusitis (06/30/2018), Chronic prostatitis (12/06/2013), 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), Constipation, 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), DDD (degenerative disc disease), cervical (04/26/2019), DDD (degenerative disc disease), lumbar (04/26/2019), Decreased hearing of both ears (02/12/2013), Deviated septum (05/31/2018), Diabetes mellitus without complication (HCC), Difficulty urinating, DM (diabetes mellitus) type II uncontrolled, periph vascular disorder (01/02/2016), Dry skin (05/09/2019), Dysuria (11/29/2010), Essential (primary) hypertension (01/26/2015), Excessive daytime sleepiness (02/11/2018), FATIGUE (12/09/2007), Fibromyalgia (03/02/2017), Food intolerance (05/09/2019), GERD (12/09/2007), GERD (gastroesophageal reflux  disease), GI bleed (02/10/2015), Headache(784.0), Hearing loss, 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/12/2013), Hemorrhoid (05/22/2011), HTN (hypertension) (02/04/2010), Hyperlipidemia, Hyperlipidemia associated with type 2 diabetes mellitus (HCC) (11/22/2020), Hyperlipidemia LDL goal <70 (01/26/2015), Hypersomnia, persistent (11/05/2012), Hypertension, Hypokalemia (08/29/2020), Hypotension (07/04/2020), 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), 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), Increasing residual urine (03/10/2022), Insect bite (03/16/2018), Insomnia (08/05/2011), Internal and external hemorrhoids without complication (10/04/2012), Internal hemorrhoids (10/22/2014), 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), Leg swelling, Localized swelling of both lower legs (03/02/2017), Low back pain (07/20/2007), Lower extremity edema (07/04/2020), Lower GI bleed, Melena (12/16/2021), Mixed conductive and sensorineural hearing loss of right ear with restricted hearing of left ear (10/16/2020), Nasal congestion, Neck pain (03/15/2020), NEPHROLITHIASIS (08/13/2006), Nocturia more than twice per night (02/11/2018), OSA on CPAP (10/17/2014), Otalgia of both ears (06/06/2021), Other chronic pain (03/02/2017), Other fatigue (07/04/2020), Pain in finger of right hand (10/06/2012), Pain in joint of right shoulder (02/21/2017), Palpitations (01/26/2015), Paresthesia (07/25/2010), Persistent atrial fibrillation (HCC) (08/29/2020), Persistent headaches (10/17/2014), 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), PONV (postoperative nausea and vomiting), Preoperative clearance (07/04/2020), Prolapsed hemorrhoids (10/05/2013), Radiculopathy, lumbar region (03/15/2020), Rash (05/24/2019), Rash and other nonspecific skin eruption (07/23/2022), Raynaud's disease (03/02/2017), Raynaud's phenomenon (03/02/2017), Rectal bleeding (02/12/2015), Rectal pain, Rectal ulcer with bleeding after hemorrhoid banding (02/11/2015), Rhinitis, chronic  (12/23/2006), Right shoulder pain (02/15/2015), Seasonal and perennial allergic rhinitis (05/09/2019), Severe episode of recurrent major depressive disorder, without psychotic features (HCC) (08/29/2020), Sinus infection (06/30/2014), Sinusitis, acute, maxillary (02/02/2014), Sleep apnea, Sleep apnea with use of continuous positive airway pressure (CPAP) (02/11/2018), SOB (shortness of breath) (07/04/2020), Stroke (HCC), Stroke (HCC), Subacute confusional state (10/17/2014), SYMPTOM, HYPERSOMNIA NOS (02/19/2007), Thrombosed hemorrhoids, Tick bite of back (11/23/2019), Tinnitus of both ears (08/29/2020), Trouble swallowing, Unspecified hemorrhoids (02/10/2015), Unspecified sleep apnea (11/05/2012), UTI (urinary tract infection) (05/19/2022), Varicose veins of lower extremities with other complications (08/10/2012), Vasomotor rhinitis (06/28/2021), and Vertigo (03/19/2012).   They are presenting to PM&R clinic as a new patient for pain management evaluation. They were referred by Dr. Zola Button for treatment of chronic pain.   Chase [name] reports he has had chronic pain for about  10 years.  His speech is limited due to history of CVAs with aphasia.  Patient has neck pain with pain affecting his bilateral arms.  He also has lower back pain and pain in his legs.  He also has burning, pins-and-needles and tingling pain in his feet, suspected to be due to diabetes mellitus.  Walking and standing worsens his pain.  Pending backwards is also painful.  Patient indicates he has pain throughout his entire body.  He reports he was previously followed by Dr. Jordan Likes for his pain.  Patient reports he had injections in his back or neck and is unsure if this is beneficial.  He indicates he was later followed by other doctors that were prescribing him medications.  He has trouble swallowing pills so is taking liquid hydrocodone.  He says this medication is helping keep his pain under control.  He also takes gabapentin 100 mg 3 times  daily which she feels like is beneficial to the pain in his feet however he does not keep it controlled.  Chart review indicates he was previously diagnosed with fibromyalgia.  He reports history of Raynaud's phenomena  Patient is a poor historian and limitations due to aphasia      Red flag symptoms: No red flags for back pain endorsed in Hx or ROS  Medications tried:  Nsaids - limited by comorbidities  Tylenol - helps Opiates  hydrocodone  Gabapentin- TID 100mg ,helping  TCAs  -denies, doesn't recall  SNRIs  - effexor helps with mood    Other treatments: PT- helped in the past  Chiropractor TENs unit - Injections - back or neck injections not sure if this helped  Surgery- denies   Goals for pain control: Decrease overall body wide pain  Prior UDS results: No results found for: "LABOPIA", "COCAINSCRNUR", "LABBENZ", "AMPHETMU", "THCU", "LABBARB"    Pain Inventory Average Pain 5 Pain Right Now 5 My pain is sharp, burning, stabbing, and aching  In the last 24 hours, has pain interfered with the following? General activity 7 Relation with others 6 Enjoyment of life 7 What TIME of day is your pain at its worst? morning , daytime, evening, and night Sleep (in general) Fair  Pain is worse with: walking, bending, sitting, standing, and some activites Pain improves with: rest, heat/ice, and medication Relief from Meds: 3  walk without assistance ability to climb steps?  yes do you drive?  yes  not employed: date last employed 2009 disabled: date disabled .  bladder control problems bowel control problems trouble walking dizziness confusion depression anxiety  Any changes since last visit?  no  Any changes since last visit?  no    Family History  Problem Relation Age of Onset   Breast cancer Mother 56       breast   Huntington's disease Mother    Stroke Father    Heart disease Father    Hypertension Father    Heart attack Father    Hypertension  Brother    Hyperlipidemia Neg Hx    Diabetes Neg Hx    Social History   Socioeconomic History   Marital status: Single    Spouse name: Not on file   Number of children: 0   Years of education: Not on file   Highest education level: Not on file  Occupational History   Not on file  Tobacco Use   Smoking status: Never   Smokeless tobacco: Never  Vaping Use   Vaping status: Never Used  Substance and  Sexual Activity   Alcohol use: No    Alcohol/week: 0.0 standard drinks of alcohol    Comment: none   Drug use: No   Sexual activity: Never  Other Topics Concern   Not on file  Social History Narrative   No caffeine intake except for chocolate.  Exercised-less walking    Social Determinants of Health   Financial Resource Strain: Not on file  Food Insecurity: Not on file  Transportation Needs: Not on file  Physical Activity: Not on file  Stress: Not on file  Social Connections: Unknown (01/17/2022)   Received from [facility]   Social Network    Social Network: Not on file   Past Surgical History:  Procedure Laterality Date   CATARACT EXTRACTION     x 3   EYE SURGERY  [place], [place], 1987   cataracts   FLEXIBLE SIGMOIDOSCOPY N/A 02/11/2015   Procedure: FLEXIBLE SIGMOIDOSCOPY;  Surgeon: Iva Boop, MD;  Location: [facility];  Service: Endoscopy;  Laterality: N/A;   INNER EAR SURGERY     rt ear   INNER EAR SURGERY  1992   surgery - right arm     1983   Past Medical History:  Diagnosis Date   Abnormality of gait 11/10/2012   Acute GI bleeding    Acute renal insufficiency 02/10/2015   Acute upper respiratory infection 08/10/2012   Allergic drug reaction 05/24/2020   Allergic rhinitis 10/16/2020   Allergy    Anemia 02/10/2015   Annual physical exam 11/26/2011   ANXIETY DEPRESSION 12/09/2007   Qualifier: Diagnosis of  By: Lillia Mountain RN, Brien Few    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) 03/12/2018   Cerebrovascular accident, old 11/16/2014   Chest discomfort 06/08/2019   Chronic back pain 11/28/2020   Chronic cough 08/27/2014   CXR 08/2014:  Mild hyperinflation, no acute process Cleda Daub 09/2014:  Difficulty with procedure, but essentially normal +CVA's in past, with aspiration risk by history Speech evaluation 2016:      Chronic left shoulder pain 08/06/2020   Chronic pain syndrome 11/23/2019   Chronic pansinusitis 06/30/2018   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   Constipation    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   DDD (degenerative disc disease), cervical 04/26/2019   DDD (degenerative disc disease), lumbar 04/26/2019   Decreased hearing of both ears 02/12/2013   Deviated septum 05/31/2018   Diabetes mellitus without complication (HCC)    Difficulty urinating    DM (diabetes mellitus) type II uncontrolled, periph vascular disorder 01/02/2016   Dry skin 05/09/2019   Dysuria 11/29/2010   Essential (primary) hypertension 01/26/2015   Excessive daytime sleepiness 02/11/2018   FATIGUE 12/09/2007   Qualifier: Diagnosis of  By: Letta Kocher    Fibromyalgia 03/02/2017   Food intolerance 05/09/2019   GERD 12/09/2007   Qualifier: Diagnosis of  By: Alphonzo Severance MD, Loni Dolly    GERD (gastroesophageal reflux disease)    GI bleed 02/10/2015   Headache(784.0)  Hearing loss    Hemangioma 02/12/2013   Hemorrhoid 05/22/2011   HTN (hypertension) 02/04/2010   Qualifier: Diagnosis of  By: Abner Greenspan MD, Stacey     Hyperlipidemia    Hyperlipidemia associated with type 2 diabetes mellitus  (HCC) 11/22/2020   Hyperlipidemia LDL goal <70 01/26/2015   Hypersomnia, persistent 11/05/2012   epworth of 18 points, was placed on CPAP in 2008 with very low AHI ( no number  Mentioned) and struggles ever since with PAP. 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   Leg swelling    Localized swelling of both lower legs 03/02/2017   Low back pain 07/20/2007   Qualifier: Diagnosis of  By: Alphonzo Severance MD, Loni Dolly    Lower extremity edema 07/04/2020   Lower GI bleed    Melena 12/16/2021   Mixed conductive and sensorineural hearing loss of right ear with restricted hearing of left ear 10/16/2020   Nasal congestion    Neck pain 03/15/2020   NEPHROLITHIASIS 08/13/2006   Qualifier: Diagnosis of  By: Bradly Bienenstock     Nocturia more than twice per night 02/11/2018   OSA on CPAP 10/17/2014   Otalgia of both ears 06/06/2021   Other chronic pain 03/02/2017   Other fatigue 07/04/2020   Pain in finger of right hand 10/06/2012   Pain in joint of right shoulder 02/21/2017   Palpitations 01/26/2015   Paresthesia 07/25/2010   Persistent atrial fibrillation (HCC) 08/29/2020   Persistent headaches 10/17/2014   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   Qualifier: Diagnosis of  By: Alphonzo Severance MD, Ivar Drape R    PONV (postoperative nausea and vomiting)    Preoperative clearance 07/04/2020   Prolapsed hemorrhoids 10/05/2013   Radiculopathy, lumbar region 03/15/2020   Rash 05/24/2019   Rash and other nonspecific skin eruption 07/23/2022   Raynaud's disease 03/02/2017   Raynaud's phenomenon 03/02/2017   Rectal bleeding 02/12/2015   Rectal  pain    Rectal ulcer with bleeding after hemorrhoid banding 02/11/2015   Rhinitis, chronic 12/23/2006   Qualifier: Diagnosis of  By: Lillia Mountain RN, Regina G    Right shoulder pain 02/15/2015   Seasonal and perennial allergic rhinitis 05/09/2019   Severe episode of recurrent major depressive disorder, 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   Sinus infection 06/30/2014   Sinusitis, acute, maxillary 02/02/2014   Sleep apnea    Sleep apnea with use of continuous positive airway pressure (CPAP) 02/11/2018   SOB (shortness of breath) 07/04/2020   Stroke (HCC)    Stroke (HCC)    Subacute confusional state 10/17/2014   SYMPTOM, HYPERSOMNIA NOS 02/19/2007   Qualifier: Diagnosis of  By: Vernie Murders     Thrombosed hemorrhoids    Tick bite of back 11/23/2019   Tinnitus of both ears 08/29/2020   Trouble swallowing    Unspecified hemorrhoids 02/10/2015   Unspecified sleep apnea 11/05/2012   UTI (urinary tract infection) 05/19/2022   Varicose veins of lower extremities with other complications 08/10/2012   Vasomotor rhinitis 06/28/2021   Vertigo 03/19/2012   BP 109/72   Pulse 92   Ht 5\' 9"  (1.753 m)   Wt 174 lb 12.8 oz (79.3 kg)  SpO2 97%   BMI 25.81 kg/m   Opioid Risk Score:   Fall Risk Score:  `1  Depression screen [facility] 2/9     01/15/2023    3:06 PM 12/22/2022    2:24 PM 06/20/2022    3:08 PM 12/16/2021   10:41 AM 07/03/2020   12:04 PM 10/21/2018   10:23 AM 08/04/2017   10:51 AM  Depression screen PHQ 2/9  Decreased Interest 2 2 0 0 0 0 --  Down, Depressed, Hopeless 2 3 0 0 0 1 2  PHQ - 2 Score 4 5 0 0 0 1 2  Altered sleeping 3 2 0 0  3 1  Tired, decreased energy 2 2 0 0  1 2  Change in appetite 2 1 0 0  0 2  Feeling bad or failure about yourself  2 0 0 0  0 3  Trouble concentrating 2 1 0 0  1 3  Moving slowly or fidgety/restless 3 1 0 0  1 2  Suicidal thoughts 0 0 0 0  0 0  PHQ-9 Score 18 12 0 0  7 15  Difficult doing work/chores Somewhat difficult Very difficult Not  difficult at all Not difficult at all  Somewhat difficult      Review of Systems  Constitutional:  Positive for diaphoresis.  HENT: Negative.    Eyes: Negative.   Respiratory:  Positive for apnea, cough and shortness of breath.   Cardiovascular: Negative.   Gastrointestinal:  Positive for abdominal pain and constipation.  Endocrine:       High blood sugars  Genitourinary:  Positive for difficulty urinating and dysuria.  Musculoskeletal:  Positive for arthralgias, back pain, gait problem, myalgias and neck pain.  Skin: Negative.   Allergic/Immunologic: Negative.   Hematological: Negative.   Psychiatric/Behavioral:  Positive for confusion and dysphoric mood. The patient is nervous/anxious.   All other systems reviewed and are negative.      Objective:   Physical Exam   Gen: no distress, normal appearing HEENT: oral mucosa pink and moist, NCAT Cardio: Reg rate Chest: normal effort, normal rate of breathing Abd: soft, non-distended Ext: no edema Psych: pleasant, normal affect Skin: intact Neuro: Alert and awake, follows commands, cranial nerves II through XII grossly intact Patient has significant aphasia appears to be more expressive than receptive Patient often has delayed responses Sensory exam normal for light touch and pain in all 4 limbs. No limb ataxia or cerebellar signs.  No definitive abnormal tone appreciated-however patient appeared to have difficulty with voluntary relaxation in his bilateral lower extremities  Musculoskeletal:  Spurling's test negative Kyphotic posture Facet loading positive Pain with spinal extension Patient with diffuse tenderness to palpation throughout bilateral upper and lower extremities Paraspinal tenderness and cervical spine and lumbar spine SLR negative however resulted in lower back pain FABER and FADIR resulted in generalized back pain  Lumbar spine MRI 06/05/2021 IMPRESSION: 1. No significant lumbar spine disc protrusion,  foraminal stenosis or central canal stenosis. 2. 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 Differential considerations include a nerve sheath tumor versus myxopapillary ependymoma. Metastatic disease is considered less likely given the lack of significant interval change.    Cervical spine MRI 01/09/2020 IMPRESSION: 1. C4-5 mild degenerative spinal stenosis which has improved from 2013 due to interval regression of a central herniation. 2. C5-6 advanced right foraminal stenosis. Mild to moderate foraminal narrowing bilaterally at C4-5 and on the left at  C5-6.    Assessment & Plan:    1) Fibromyalgia -Overall his exam appears to be most consistent with fibromyalgia, has very diffuse tenderness and pain 2)  Peripheral polyneuropathy likely due to diabetes mellitus type 3) chronic lower back pain and chronic neck pain. 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  Denies SI or HI 5) History of CVA.  Patient continues to have aphasia  Plan 1) will increase gabapentin to 200 mg 3 times a day.  He is currently on 100 mg 3 times a day 2) Previously on liquid hydrocodone, ORT low.  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  Patient is a very limited historian and has a very complex medical history 5) will refer to physical therapy to work on lower back pain and fibromyalgia 6) continue venlafaxine XR 7) continue Flexeril as needed 8) consider Qutenza if polyneuropathy pain does not improve with oral medications 9) consider repeat L spine MRI

## 2023-01-15 NOTE — Progress Notes (Incomplete)
Subjective:    Patient ID: Chase [name], male    DOB: 15-Nov-1960, 62 y.o.   MRN: [id number]  HPI HPI  Chase [name] is a 62 y.o. 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) (03/12/2018), Cerebrovascular accident, old (11/16/2014), Chest discomfort (06/08/2019), Chronic back pain (11/28/2020), Chronic cough (08/27/2014), Chronic left shoulder pain (08/06/2020), Chronic pain syndrome (11/23/2019), Chronic pansinusitis (06/30/2018), Chronic prostatitis (12/06/2013), 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), Constipation, 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), DDD (degenerative disc disease), cervical (04/26/2019), DDD (degenerative disc disease), lumbar (04/26/2019), Decreased hearing of both ears (02/12/2013), Deviated septum (05/31/2018), Diabetes mellitus without complication (HCC), Difficulty urinating, DM (diabetes mellitus) type II uncontrolled, periph vascular disorder (01/02/2016), Dry skin (05/09/2019), Dysuria (11/29/2010), Essential (primary) hypertension (01/26/2015), Excessive daytime sleepiness (02/11/2018), FATIGUE (12/09/2007), Fibromyalgia (03/02/2017), Food intolerance (05/09/2019), GERD (12/09/2007), GERD (gastroesophageal reflux  disease), GI bleed (02/10/2015), Headache(784.0), Hearing loss, 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/12/2013), Hemorrhoid (05/22/2011), HTN (hypertension) (02/04/2010), Hyperlipidemia, Hyperlipidemia associated with type 2 diabetes mellitus (HCC) (11/22/2020), Hyperlipidemia LDL goal <70 (01/26/2015), Hypersomnia, persistent (11/05/2012), Hypertension, Hypokalemia (08/29/2020), Hypotension (07/04/2020), 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), 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), Increasing residual urine (03/10/2022), Insect bite (03/16/2018), Insomnia (08/05/2011), Internal and external hemorrhoids without complication (10/04/2012), Internal hemorrhoids (10/22/2014), 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), Leg swelling, Localized swelling of both lower legs (03/02/2017), Low back pain (07/20/2007), Lower extremity edema (07/04/2020), Lower GI bleed, Melena (12/16/2021), Mixed conductive and sensorineural hearing loss of right ear with restricted hearing of left ear (10/16/2020), Nasal congestion, Neck pain (03/15/2020), NEPHROLITHIASIS (08/13/2006), Nocturia more than twice per night (02/11/2018), OSA on CPAP (10/17/2014), Otalgia of both ears (06/06/2021), Other chronic pain (03/02/2017), Other fatigue (07/04/2020), Pain in finger of right hand (10/06/2012), Pain in joint of right shoulder (02/21/2017), Palpitations (01/26/2015), Paresthesia (07/25/2010), Persistent atrial fibrillation (HCC) (08/29/2020), Persistent headaches (10/17/2014), 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), PONV (postoperative nausea and vomiting), Preoperative clearance (07/04/2020), Prolapsed hemorrhoids (10/05/2013), Radiculopathy, lumbar region (03/15/2020), Rash (05/24/2019), Rash and other nonspecific skin eruption (07/23/2022), Raynaud's disease (03/02/2017), Raynaud's phenomenon (03/02/2017), Rectal bleeding (02/12/2015), Rectal pain, Rectal ulcer with bleeding after hemorrhoid banding (02/11/2015), Rhinitis, chronic  (12/23/2006), Right shoulder pain (02/15/2015), Seasonal and perennial allergic rhinitis (05/09/2019), Severe episode of recurrent major depressive disorder, without psychotic features (HCC) (08/29/2020), Sinus infection (06/30/2014), Sinusitis, acute, maxillary (02/02/2014), Sleep apnea, Sleep apnea with use of continuous positive airway pressure (CPAP) (02/11/2018), SOB (shortness of breath) (07/04/2020), Stroke (HCC), Stroke (HCC), Subacute confusional state (10/17/2014), SYMPTOM, HYPERSOMNIA NOS (02/19/2007), Thrombosed hemorrhoids, Tick bite of back (11/23/2019), Tinnitus of both ears (08/29/2020), Trouble swallowing, Unspecified hemorrhoids (02/10/2015), Unspecified sleep apnea (11/05/2012), UTI (urinary tract infection) (05/19/2022), Varicose veins of lower extremities with other complications (08/10/2012), Vasomotor rhinitis (06/28/2021), and Vertigo (03/19/2012).   They are presenting to PM&R clinic as a new patient for pain management evaluation. They were referred by Dr. Zola Button for treatment of chronic pain.   Mr. Vancott reports he has had chronic pain for about  10 years.  His speech is limited due to history of CVAs with aphasia.  Patient has neck pain with pain affecting his bilateral arms.  He also has lower back pain and pain in his legs.  He also has burning, pins-and-needles and tingling pain in his feet, suspected to be due to diabetes mellitus.  Walking and standing worsens his pain.  Pending backwards is also painful.  Patient indicates he has pain throughout his entire body.  He reports he was previously followed by Dr. Jordan Likes for his pain.  Patient reports he had injections in his back or neck and is unsure if this is beneficial.  He indicates he was later followed by other doctors that were prescribing him medications.  He has trouble swallowing pills so is taking liquid hydrocodone.  He says this medication is helping keep his pain under control.  He also takes gabapentin 100 mg 3 times  daily which she feels like is beneficial to the pain in his feet however he does not keep it controlled.  Chart review indicates he was previously diagnosed with fibromyalgia.  He reports history of Raynaud's phenomena  Patient is a poor historian and limitations due to aphasia    Pain for 10 years.  Hx of CVAs Has, aphasia Delayed responses PVD-  Lower back pain  Raynauds Neck pain and b/l arm pain  Pain in legs and feet-diabetes , pain all the time Saw Dr. Jordan Likes for pain  Injjections in his back and maybe neck? [employer] Standing worsens his pain  Walking worsns Bending in any direction - worward is worse , Maybe? SLR lower back    e      Red flag symptoms: No red flags for back pain endorsed in Hx or ROS  Medications tried:  Nsaids - limited by comorbidities  Tylenol - helps Opiates  hydrocodone  Gabapentin- TID 100mg ,helping  TCAs  -denies, doesn't recall  SNRIs  - effexor helps with mood    Other treatments: PT- helped in the past  Chiropractor TENs unit - Injections - back or neck injections not sure if this helped  Surgery- denies   Goals for pain control: Decrease overall body wide pain  Prior UDS results: No results found for: "LABOPIA", "COCAINSCRNUR", "LABBENZ", "AMPHETMU", "THCU", "LABBARB"    Pain Inventory Average Pain 5 Pain Right Now 5 My pain is sharp, burning, stabbing, and aching  In the last 24 hours, has pain interfered with the following? General activity 7 Relation with others 6 Enjoyment of life 7 What TIME of day is your pain at its worst? morning , daytime, evening, and night Sleep (in general) Fair  Pain is worse with: walking, bending, sitting, standing, and some activites Pain improves with: rest, heat/ice, and medication Relief from Meds: 3  walk without assistance ability to climb steps?  yes do you drive?  yes  not employed: date last employed 2009 disabled: date disabled .  bladder control  problems bowel control problems trouble walking dizziness confusion depression anxiety  Any changes since last visit?  no  Any changes since last visit?  no    Family History  Problem Relation Age of Onset  . Breast cancer Mother 51       breast  . Huntington's disease Mother   . Stroke Father   . Heart disease Father   . Hypertension Father   . Heart attack Father   . Hypertension Brother   . Hyperlipidemia Neg Hx   . Diabetes Neg Hx  Social History   Socioeconomic History  . Marital status: Single    Spouse name: Not on file  . Number of children: 0  . Years of education: Not on file  . Highest education level: Not on file  Occupational History  . Not on file  Tobacco Use  . Smoking status: Never  . Smokeless tobacco: Never  Vaping Use  . Vaping status: Never Used  Substance and Sexual Activity  . Alcohol use: No    Alcohol/week: 0.0 standard drinks of alcohol    Comment: none  . Drug use: No  . Sexual activity: Never  Other Topics Concern  . Not on file  Social History Narrative   No caffeine intake except for chocolate.  Exercised-less walking    Social Determinants of Health   Financial Resource Strain: Not on file  Food Insecurity: Not on file  Transportation Needs: Not on file  Physical Activity: Not on file  Stress: Not on file  Social Connections: Unknown (01/17/2022)   Received from [facility]   Social Network   . Social Network: Not on file   Past Surgical History:  Procedure Laterality Date  . CATARACT EXTRACTION     x 3  . EYE SURGERY  1968, 1985, 1987   cataracts  . FLEXIBLE SIGMOIDOSCOPY N/A 02/11/2015   Procedure: FLEXIBLE SIGMOIDOSCOPY;  Surgeon: Iva Boop, MD;  Location: [facility];  Service: Endoscopy;  Laterality: N/A;  . INNER EAR SURGERY     rt ear  . INNER EAR SURGERY  1992  . surgery - right arm     1983   Past Medical History:  Diagnosis Date  . Abnormality of gait 11/10/2012  . Acute GI  bleeding   . Acute renal insufficiency 02/10/2015  . Acute upper respiratory infection 08/10/2012  . Allergic drug reaction 05/24/2020  . Allergic rhinitis 10/16/2020  . Allergy   . Anemia 02/10/2015  . Annual physical exam 11/26/2011  . 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 Arthralgia 07/25/2010  . Arthritis   . Benign neoplasm of brain (HCC) 05/24/2019  . Bigeminy ? 03/22/2012  . Bilateral hearing loss 11/23/2019  . Bilateral leg and foot pain 03/02/2017  . Blood in stool   . Blood in stool, frank 02/10/2015  . Body mass index (BMI) 26.0-26.9, adult 03/15/2020  . BPH without urinary obstruction 08/29/2020  . Brain cyst   . 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 Cerebrovascular accident, old 11/16/2014  . Chest discomfort 06/08/2019  . Chronic back pain 11/28/2020  . Chronic cough 08/27/2014   CXR 08/2014:  Mild hyperinflation, no acute process Cleda Daub 09/2014:  Difficulty with procedure, but essentially normal +CVA's in past, with aspiration risk by history Speech evaluation 2016:     . Chronic left shoulder pain 08/06/2020  . Chronic pain syndrome 11/23/2019  . Chronic pansinusitis 06/30/2018  . 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  . 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 Constipation   . 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 DDD (degenerative disc disease), cervical 04/26/2019  . DDD (degenerative disc disease), lumbar 04/26/2019  . Decreased hearing of both ears 02/12/2013  . Deviated septum 05/31/2018  . Diabetes mellitus without complication (HCC)   . Difficulty urinating   . DM (diabetes mellitus) type II uncontrolled, periph vascular disorder 01/02/2016  . Dry skin 05/09/2019  .  Dysuria 11/29/2010  . Essential (primary) hypertension 01/26/2015  . Excessive daytime sleepiness 02/11/2018  . FATIGUE 12/09/2007   Qualifier: Diagnosis of   By: Lillia Mountain RN, Brien Few Fibromyalgia 03/02/2017  . Food intolerance 05/09/2019  . GERD 12/09/2007   Qualifier: Diagnosis of  By: Alphonzo Severance MD, Loni Dolly   . GERD (gastroesophageal reflux disease)   . GI bleed 02/10/2015  . Headache(784.0)   . Hearing loss   . Hemangioma 02/12/2013  . Hemorrhoid 05/22/2011  . HTN (hypertension) 02/04/2010   Qualifier: Diagnosis of  By: Abner Greenspan MD, Misty Stanley    . Hyperlipidemia   . Hyperlipidemia associated with type 2 diabetes mellitus (HCC) 11/22/2020  . Hyperlipidemia LDL goal <70 01/26/2015  . Hypersomnia, persistent 11/05/2012   epworth of 18 points, was placed on CPAP in 2008 with very low AHI ( no number  Mentioned) and struggles ever since with PAP. .    . Hypertension   . Hypokalemia 08/29/2020  . Hypotension 07/04/2020  . 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  . 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/23/2019  . Increasing residual urine 03/10/2022  . Insect bite 03/16/2018  . Insomnia 08/05/2011  . Internal and external hemorrhoids without complication 10/04/2012  . Internal hemorrhoids 10/22/2014  . 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 Leg swelling   . Localized swelling of both lower legs 03/02/2017  . Low back pain 07/20/2007   Qualifier: Diagnosis of  By: Alphonzo Severance MD, Loni Dolly   . Lower extremity edema 07/04/2020  . Lower GI bleed   . Melena 12/16/2021  . Mixed conductive and sensorineural hearing loss of right ear with restricted hearing of left ear 10/16/2020  . Nasal congestion   . Neck pain 03/15/2020  . NEPHROLITHIASIS 08/13/2006   Qualifier: Diagnosis of  By: Bradly Bienenstock    . Nocturia more than twice per night 02/11/2018  . OSA on CPAP 10/17/2014  . Otalgia of both ears 06/06/2021  . Other chronic pain 03/02/2017  . Other fatigue 07/04/2020  . Pain in finger of right hand 10/06/2012  . Pain in joint of right shoulder 02/21/2017  . Palpitations 01/26/2015  . Paresthesia 07/25/2010   . Persistent atrial fibrillation (HCC) 08/29/2020  . Persistent headaches 10/17/2014  . 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   Qualifier: Diagnosis of  By: Alphonzo Severance MD, Loni Dolly   . PONV (postoperative nausea and vomiting)   . Preoperative clearance 07/04/2020  . Prolapsed hemorrhoids 10/05/2013  . Radiculopathy, lumbar region 03/15/2020  . Rash 05/24/2019  . Rash and other nonspecific skin eruption 07/23/2022  . Raynaud's disease 03/02/2017  . Raynaud's phenomenon 03/02/2017  . Rectal bleeding 02/12/2015  . Rectal pain   . Rectal ulcer with bleeding after hemorrhoid banding 02/11/2015  . Rhinitis, chronic 12/23/2006   Qualifier: Diagnosis of  By: Lillia Mountain RN, Brien Few Right shoulder pain 02/15/2015  . Seasonal and perennial allergic rhinitis 05/09/2019  . Severe episode of recurrent major depressive disorder, 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  . Sinus infection 06/30/2014  . Sinusitis, acute, maxillary 02/02/2014  . Sleep apnea   . Sleep apnea with use of continuous positive airway pressure (CPAP) 02/11/2018  . SOB (shortness of breath) 07/04/2020  . Stroke (HCC)   . Stroke (HCC)   . Subacute confusional state 10/17/2014  . SYMPTOM, HYPERSOMNIA NOS 02/19/2007   Qualifier: Diagnosis of  By: Vernie Murders    . Thrombosed hemorrhoids   . Tick bite of back 11/23/2019  .  Tinnitus of both ears 08/29/2020  . Trouble swallowing   . Unspecified hemorrhoids 02/10/2015  . Unspecified sleep apnea 11/05/2012  . UTI (urinary tract infection) 05/19/2022  . Varicose veins of lower extremities with other complications 08/10/2012  . Vasomotor rhinitis 06/28/2021  . Vertigo 03/19/2012   BP 109/72   Pulse 92   Ht 5\' 9"  (1.753 m)   Wt 174 lb 12.8 oz (79.3 kg)   SpO2 97%   BMI 25.81 kg/m   Opioid Risk Score:   Fall Risk Score:  `1  Depression screen [facility] 2/9     01/15/2023    3:06 PM 12/22/2022    2:24 PM 06/20/2022    3:08 PM 12/16/2021   10:41 AM 07/03/2020   12:04 PM 10/21/2018    10:23 AM 08/04/2017   10:51 AM  Depression screen PHQ 2/9  Decreased Interest 2 2 0 0 0 0 --  Down, Depressed, Hopeless 2 3 0 0 0 1 2  PHQ - 2 Score 4 5 0 0 0 1 2  Altered sleeping 3 2 0 0  3 1  Tired, decreased energy 2 2 0 0  1 2  Change in appetite 2 1 0 0  0 2  Feeling bad or failure about yourself  2 0 0 0  0 3  Trouble concentrating 2 1 0 0  1 3  Moving slowly or fidgety/restless 3 1 0 0  1 2  Suicidal thoughts 0 0 0 0  0 0  PHQ-9 Score 18 12 0 0  7 15  Difficult doing work/chores Somewhat difficult Very difficult Not difficult at all Not difficult at all  Somewhat difficult      Review of Systems  Constitutional:  Positive for diaphoresis.  HENT: Negative.    Eyes: Negative.   Respiratory:  Positive for apnea, cough and shortness of breath.   Cardiovascular: Negative.   Gastrointestinal:  Positive for abdominal pain and constipation.  Endocrine:       High blood sugars  Genitourinary:  Positive for difficulty urinating and dysuria.  Musculoskeletal:  Positive for arthralgias, back pain, gait problem, myalgias and neck pain.  Skin: Negative.   Allergic/Immunologic: Negative.   Hematological: Negative.   Psychiatric/Behavioral:  Positive for confusion and dysphoric mood. The patient is nervous/anxious.   All other systems reviewed and are negative.      Objective:   Physical Exam   Gen: no distress, normal appearing HEENT: oral mucosa pink and moist, NCAT Cardio: Reg rate Chest: normal effort, normal rate of breathing Abd: soft, non-distended Ext: no edema Psych: pleasant, normal affect Skin: intact Neuro: Alert and awake, follows commands, cranial nerves II through XII grossly intact Patient has significant aphasia appears to be more expressive than receptive Patient often has delayed responses  Musculoskeletal: ***        Assessment & Plan:    1) Fibromyalgia 2)  Peripheral polyneuropathy likely due to diabetes mellitus type 2 3)   -Increase  gabapentin to 200 mg 3 times daily , is currently on 100 mg 3 times daily -Consider Qutenza 4) depression history.  Denies SI or HI   ORT- O low risk TENS unit  Records PT Gabapentin

## 2023-01-16 ENCOUNTER — Encounter: Payer: Self-pay | Admitting: Physical Medicine & Rehabilitation

## 2023-01-16 ENCOUNTER — Other Ambulatory Visit: Payer: Self-pay | Admitting: Physical Medicine & Rehabilitation

## 2023-01-16 DIAGNOSIS — G8929 Other chronic pain: Secondary | ICD-10-CM

## 2023-01-21 ENCOUNTER — Ambulatory Visit: Payer: Medicaid Other

## 2023-01-27 ENCOUNTER — Other Ambulatory Visit: Payer: Self-pay | Admitting: Family Medicine

## 2023-02-02 ENCOUNTER — Encounter: Payer: Self-pay | Admitting: Internal Medicine

## 2023-02-02 ENCOUNTER — Ambulatory Visit: Payer: Medicaid Other | Admitting: Internal Medicine

## 2023-02-02 VITALS — BP 132/80 | HR 84 | Ht 69.0 in | Wt 176.0 lb

## 2023-02-02 DIAGNOSIS — E1165 Type 2 diabetes mellitus with hyperglycemia: Secondary | ICD-10-CM | POA: Diagnosis not present

## 2023-02-02 DIAGNOSIS — Z7985 Long-term (current) use of injectable non-insulin antidiabetic drugs: Secondary | ICD-10-CM | POA: Diagnosis not present

## 2023-02-02 DIAGNOSIS — E785 Hyperlipidemia, unspecified: Secondary | ICD-10-CM | POA: Diagnosis not present

## 2023-02-02 DIAGNOSIS — Z7984 Long term (current) use of oral hypoglycemic drugs: Secondary | ICD-10-CM

## 2023-02-02 DIAGNOSIS — E1159 Type 2 diabetes mellitus with other circulatory complications: Secondary | ICD-10-CM | POA: Diagnosis not present

## 2023-02-02 DIAGNOSIS — E119 Type 2 diabetes mellitus without complications: Secondary | ICD-10-CM

## 2023-02-02 LAB — POCT GLYCOSYLATED HEMOGLOBIN (HGB A1C): Hemoglobin A1C: 7.6 % — AB (ref 4.0–5.6)

## 2023-02-02 MED ORDER — FREESTYLE LIBRE 3 SENSOR MISC: 1.0000 | 3 refills | Status: DC

## 2023-02-02 MED ORDER — FREESTYLE LIBRE 3 READER DEVI: 1.0000 | Freq: Once | 0 refills | Status: AC

## 2023-02-02 NOTE — Progress Notes (Signed)
Patient ID: GARRELL LAVERS, male   DOB: 01-12-1961, 62 y.o.   MRN: [id number]  HPI: HELEN BUWALDA is a 62 y.o.-year-old male, presenting for follow-up for DM2, dx in 2014, non-insulin-dependent, uncontrolled, with complications (PVD, cerebro-vascular ds - s/p "mini"strokes, PN). Last visit 4 months ago. He had [employer], now [place] only.  Interim history: He has occasional blurry vision, no increased urination. He continues to have memory loss and difficulty word finding. At last visit he was still drinking apple juice as he could not drink plain water due to indigestion. He is still drinking this, but started to buy Gatorade Zero.  HbA1c levels reviewed: Lab Results  Component Value Date   HGBA1C 7.8 (A) 09/19/2022   HGBA1C 8.2 (A) 05/20/2022   HGBA1C 6.5 (A) 01/14/2022   HGBA1C 6.7 (A) 09/10/2021   HGBA1C 6.7 (A) 06/03/2021   HGBA1C 6.2 (A) 01/03/2021   HGBA1C 6.7 (A) 09/10/2020   HGBA1C 8.2 (H) 05/30/2020   HGBA1C 8.3 (H) 05/24/2020   HGBA1C 9.7 (H) 04/05/2020   HGBA1C 8.3 (H) 05/24/2019   HGBA1C 7.9 (A) 08/04/2018   HGBA1C 7.5 (A) 12/21/2017   HGBA1C 7.4 09/17/2017   HGBA1C 7.8 06/19/2017   HGBA1C 7.1 (H) 02/19/2017   HGBA1C 7.4 02/21/2016   HGBA1C 9.8 (H) 11/13/2015   HGBA1C 6.8 (H) 02/11/2015   HGBA1C 6.8 (H) 10/03/2014   Pt is on a regimen of:  - Metformin 500 mg 1x a day >> 2x >> 1x a day b/c problems with swallowing >> 2x a day - Jardiance 25 mg at night >> before breakfast - Januvia 100 mg in am >> Victoza 1.8 mg weekly-started 05/2020 >> Ozempic 0.5 mg weekly In 11/2018, we tried to change from Januvia to Ozempic but this was not covered. Stopped Glipizide b/c rash. Stopped Invokana 100 mg in am >> only got this for 1 mo He was on Metformin 500 mg po bid - in liquid form, as he could not swallow pills. He gets gi upset. He stopped this in 2014.  He is not checking sugars-fingerstick values are not accurate due to Raynaud's syndrome, however,  freestyle libre CGM was not approved by his insurance - after several trials.  He checks sugars approximately 0-1x a day: - brunch: 123, 124-138 >> 139-181, 204, 210 >>  148, 152, 162, 163, 171 >> 128, 161 - 2h after brunch: 137-151 >> N/c >> 99-135, 145 >> 204-214 >> 145, 159 >> n/c - before dinner: 150-200 >> n/c >> 115 >> 109, 149 >> n/c  >> 140 - 2h after dinner:  180-210 >> 260 >> n/c >> 163, 183 >> n/c >> 212 - bedtime: 208 >> ? >> 164, 173 >> 121 >> n/c >> 145 >> n/c - nighttime: n/c >> 108-138, 151 >> n/c Lowest sugar was 108 >> 115 >> 107 >> 80s >> 100;  he has hypoglycemia awareness in the 70s. Highest sugar was 261 (Prednisone) >> 183 >> 214 >> 212.  Glucometer: [employer] Next >> AccuChek guide  In the past, he was occasional lemonade and ginger ale >> advised to stop - still drinking these but rarely.  -+ Mild CKD, last BUN/creatinine:  Lab Results  Component Value Date   BUN 8 12/22/2022   BUN 8 06/20/2022   CREATININE 0.95 12/22/2022   CREATININE 0.89 06/20/2022   -+ HL.  last set of lipids: Lab Results  Component Value Date   CHOL 134 12/22/2022   HDL 27.50 (L) 12/22/2022  LDLCALC 93 06/20/2022   LDLDIRECT 59.0 12/22/2022   TRIG 281.0 (H) 12/22/2022   CHOLHDL 5 12/22/2022  On pravastatin 40.  - last eye exam was in 2022: No DR reportedly.  Dr. Lucina Mellow.  - He has numbness and tingling in his feet, also pain.  Foot exam performed 10/18/2022.  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 Has varicose veins >> wears compresion hoses.He also has a h/o kidney stones.   On allopurinol for gout. He has anxiety, PTSD from childhood.  Early in 2022, he was diagnosed with an intradural extramedullary spinal tumor.  It did not change in appearance on a recent MRI. He had prostatitis and UTIs. He is on Tamsulozin (changed from terazosin) and finasteride.  He was on nitrofurantoin.  ROS: + See HPI.  Past  Medical History:  Diagnosis Date   Abnormality of gait 11/10/2012   Acute GI bleeding    Acute renal insufficiency 02/10/2015   Acute upper respiratory infection 08/10/2012   Allergic drug reaction 05/24/2020   Allergic rhinitis 10/16/2020   Allergy    Anemia 02/10/2015   Annual physical exam 11/26/2011   ANXIETY DEPRESSION 12/09/2007   Qualifier: Diagnosis of  By: Lillia Mountain RN, Brien Few    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) 03/12/2018   Cerebrovascular accident, old 11/16/2014   Chest discomfort 06/08/2019   Chronic back pain 11/28/2020   Chronic cough 08/27/2014   CXR 08/2014:  Mild hyperinflation, no acute process Cleda Daub 09/2014:  Difficulty with procedure, but essentially normal +CVA's in past, with aspiration risk by history Speech evaluation 2016:      Chronic left shoulder pain 08/06/2020   Chronic pain syndrome 11/23/2019   Chronic pansinusitis 06/30/2018   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   Constipation    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   DDD (degenerative disc disease), cervical 04/26/2019   DDD (degenerative disc disease), lumbar 04/26/2019   Decreased hearing of both ears 02/12/2013   Deviated septum 05/31/2018   Diabetes mellitus without complication (HCC)    Difficulty urinating    DM (diabetes mellitus) type II uncontrolled, periph vascular disorder 01/02/2016   Dry skin 05/09/2019   Dysuria 11/29/2010   Essential (primary) hypertension 01/26/2015   Excessive daytime sleepiness 02/11/2018   FATIGUE  12/09/2007   Qualifier: Diagnosis of  By: Letta Kocher    Fibromyalgia 03/02/2017   Food intolerance 05/09/2019   GERD 12/09/2007   Qualifier: Diagnosis of  By: Alphonzo Severance MD, Loni Dolly    GERD (gastroesophageal reflux disease)    GI bleed 02/10/2015   Headache(784.0)    Hearing loss    Hemangioma 02/12/2013   Hemorrhoid 05/22/2011   HTN (hypertension) 02/04/2010   Qualifier: Diagnosis of  By: Abner Greenspan MD, Stacey     Hyperlipidemia    Hyperlipidemia associated with type 2 diabetes mellitus (HCC) 11/22/2020   Hyperlipidemia LDL goal <70 01/26/2015   Hypersomnia, persistent 11/05/2012  epworth of 18 points, was placed on CPAP in 2008 with very low AHI ( no number  Mentioned) and struggles ever since with PAP. 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   Leg swelling    Localized swelling of both lower legs 03/02/2017   Low back pain 07/20/2007   Qualifier: Diagnosis of  By: Alphonzo Severance MD, Loni Dolly    Lower extremity edema 07/04/2020   Lower GI bleed    Melena 12/16/2021   Mixed conductive and sensorineural hearing loss of right ear with restricted hearing of left ear 10/16/2020   Nasal congestion    Neck pain 03/15/2020   NEPHROLITHIASIS 08/13/2006   Qualifier: Diagnosis of  By: Bradly Bienenstock     Nocturia more than twice per night 02/11/2018   OSA on CPAP 10/17/2014   Otalgia of both ears 06/06/2021   Other chronic pain 03/02/2017   Other fatigue 07/04/2020   Pain in finger of right hand 10/06/2012   Pain in joint of right shoulder 02/21/2017   Palpitations 01/26/2015   Paresthesia 07/25/2010    Persistent atrial fibrillation (HCC) 08/29/2020   Persistent headaches 10/17/2014   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   Qualifier: Diagnosis of  By: Alphonzo Severance MD, Ivar Drape R    PONV (postoperative nausea and vomiting)    Preoperative clearance 07/04/2020   Prolapsed hemorrhoids 10/05/2013   Radiculopathy, lumbar region 03/15/2020   Rash 05/24/2019   Rash and other nonspecific skin eruption 07/23/2022   Raynaud's disease 03/02/2017   Raynaud's phenomenon 03/02/2017   Rectal bleeding 02/12/2015   Rectal pain    Rectal ulcer with bleeding after hemorrhoid banding 02/11/2015   Rhinitis, chronic 12/23/2006   Qualifier: Diagnosis of  By: Lillia Mountain RN, Regina G    Right shoulder pain 02/15/2015   Seasonal and perennial allergic rhinitis 05/09/2019   Severe episode of recurrent major depressive disorder, 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   Sinus infection 06/30/2014   Sinusitis, acute, maxillary 02/02/2014   Sleep apnea    Sleep apnea with use of continuous positive airway pressure (CPAP) 02/11/2018   SOB (shortness of breath) 07/04/2020   Stroke (HCC)    Stroke (HCC)    Subacute confusional state 10/17/2014   SYMPTOM, HYPERSOMNIA NOS 02/19/2007   Qualifier: Diagnosis of  By: Vernie Murders     Thrombosed hemorrhoids    Tick bite of back 11/23/2019   Tinnitus of both ears 08/29/2020   Trouble swallowing    Unspecified hemorrhoids 02/10/2015   Unspecified sleep apnea 11/05/2012   UTI (urinary tract infection) 05/19/2022   Varicose veins of lower extremities with other complications 08/10/2012   Vasomotor rhinitis 06/28/2021   Vertigo 03/19/2012   Past Surgical History:  Procedure Laterality Date   CATARACT EXTRACTION     x 3   EYE SURGERY  1968, 1985, 1987   cataracts   FLEXIBLE SIGMOIDOSCOPY N/A 02/11/2015   Procedure: FLEXIBLE SIGMOIDOSCOPY;  Surgeon: Iva Boop, MD;  Location: [facility];  Service: Endoscopy;  Laterality: N/A;   INNER EAR SURGERY     rt ear   INNER  EAR SURGERY  1992   surgery - right arm  62   Social History   Socioeconomic History   Marital status: Single    Spouse name: Not on file   Number of children: 0   Years of education: Not on file   Highest education level: Not on file  Occupational History   Not on file  Tobacco Use   Smoking status: Never   Smokeless tobacco: Never  Vaping Use   Vaping status: Never Used  Substance and Sexual Activity   Alcohol use: No    Alcohol/week: 0.0 standard drinks of alcohol    Comment: none   Drug use: No   Sexual activity: Never  Other Topics Concern   Not on file  Social History Narrative   No caffeine intake except for chocolate.  Exercised-less walking    Social Determinants of Corporate investment banker Strain: Not on file  Food Insecurity: Not on file  Transportation Needs: Not on file  Physical Activity: Not on file  Stress: Not on file  Social Connections: Unknown (01/17/2022)   Received [facility]   Social Network    Social Network: Not on file  Intimate Partner Violence: Unknown (01/17/2022)   Received from [facility]   HITS    Physically Hurt: Not on file    Insult or Talk Down To: Not on file    Threaten Physical Harm: Not on file    Scream or Curse: Not on file   Current Outpatient Medications on File Prior to Visit  Medication Sig Dispense Refill   Accu-Chek Softclix Lancets lancets Use as instructed 1x a day 100 each PRN   allopurinol (ZYLOPRIM) 100 MG tablet TAKE 1 TABLET BY MOUTH TWICE A DAY 180 tablet 1   amLODipine (NORVASC) 2.5 MG tablet Take 1 tablet (2.5 mg total) by mouth daily. 90 tablet 2   azelastine (ASTELIN) 0.1 % nasal spray Place 1 spray into both nostrils 2 (two) times daily. Use in each nostril as directed 30 mL 12   Azelastine HCl (ASTEPRO) 0.15 % SOLN Place 1 spray into the nose in the morning and at bedtime. 1 mL 3   BD PEN NEEDLE NANO 2ND GEN 32G X 4 MM MISC USE ONCE DAILY AS DIRECTED 100  each 3   clotrimazole-betamethasone (LOTRISONE) cream Apply 1 application topically as needed for rash.     Continuous Blood Gluc Receiver (FREESTYLE LIBRE 2 READER) DEVI 1 each by Does not apply route daily. 1 each 0   Continuous Blood Gluc Sensor (FREESTYLE LIBRE 2 SENSOR) MISC 1 each by Does not apply route every 14 (fourteen) days. 6 each 3   cyclobenzaprine (FLEXERIL) 10 MG tablet TAKE 1 TABLET BY MOUTH TWICE A DAY AS NEEDED FOR MUSCLE SPASM 42 tablet 2   empagliflozin (JARDIANCE) 25 MG TABS tablet Take 1 tablet (25 mg total) by mouth daily. 90 tablet 3   EPINEPHrine 0.3 mg/0.3 mL IJ SOAJ injection Inject 0.3 mg into the muscle as needed for anaphylaxis. 1 each 0   famotidine (PEPCID) 40 MG/5ML suspension TAKE 2.5 MLS (20 MG TOTAL) BY MOUTH DAILY. 50 mL 2   FIBER SELECT GUMMIES PO Take 1 tablet by mouth daily.     finasteride (PROSCAR) 5 MG tablet Take 5 mg by mouth daily.     fluticasone (CUTIVATE) 0.05 % cream Apply 1 application topically 2 (two) times daily.     gabapentin (NEURONTIN) 100 MG capsule Take 2 capsules (200 mg total) by mouth 3 (three) times daily. 180  capsule 3   GAVILAX 17 GM/SCOOP powder TAKE 17 G BY MOUTH DAILY AS NEEDED. 238 g 5   glucose blood (ACCU-CHEK GUIDE) test strip Use as instructed 1x a day - 100 each PRN   hydrocortisone (ANUSOL-HC) 2.5 % rectal cream PLACE 1 APPLICATION RECTALLY 2 (TWO) TIMES DAILY. 30 g 2   hydrOXYzine (ATARAX) 25 MG tablet TAKE 1 TABLET (25 MG TOTAL) BY MOUTH EVERY 8 (EIGHT) HOURS AS NEEDED FOR ANXIETY OR ITCHING. 270 tablet 2   losartan (COZAAR) 100 MG tablet Take 1 tablet (100 mg total) by mouth daily. 90 tablet 1   metFORMIN (GLUCOPHAGE) 500 MG tablet Take 1 tablet (500 mg total) by mouth 2 (two) times daily with a meal. 180 tablet 3   metoprolol tartrate (LOPRESSOR) 50 MG tablet TAKE 1 TABLET BY MOUTH EVERY DAY 90 tablet 1   OZEMPIC, 0.25 OR 0.5 MG/DOSE, 2 MG/3ML SOPN Inject 0.5 mg into the skin once a week. 9 mL 3   pravastatin  (PRAVACHOL) 20 MG tablet Take 2 tablets (40 mg total) by mouth daily. 180 tablet 0   promethazine-dextromethorphan (PROMETHAZINE-DM) 6.25-15 MG/5ML syrup Take 5 mLs by mouth 4 (four) times daily as needed. 118 mL 0   tamsulosin (FLOMAX) 0.4 MG CAPS capsule Take 0.4 mg by mouth daily.     triamcinolone (NASACORT) 55 MCG/ACT AERO nasal inhaler Place 2 sprays into the nose daily. 1 each 12   venlafaxine XR (EFFEXOR-XR) 75 MG 24 hr capsule TAKE 1 CAPSULE BY MOUTH DAILY WITH BREAKFAST. 90 capsule 1   No current facility-administered medications on file prior to visit.   Allergies  Allergen Reactions   Amoxicillin Hives, Itching and Other (See Comments)    White tongue; ? hives   Sulfa Antibiotics Hives and Rash   Terfenadine     ? reaction   Family History  Problem Relation Age of Onset   Breast cancer Mother 3       breast   Huntington's disease Mother    Stroke Father    Heart disease Father    Hypertension Father    Heart attack Father    Hypertension Brother    Hyperlipidemia Neg Hx    Diabetes Neg Hx    PE: BP 132/80   Pulse 84   Ht 5\' 9"  (1.753 m)   Wt 176 lb (79.8 kg)   SpO2 97%   BMI 25.99 kg/m  Wt Readings from Last 3 Encounters:  02/02/23 176 lb (79.8 kg)  01/15/23 174 lb 12.8 oz (79.3 kg)  12/22/22 176 lb 9.6 oz (80.1 kg)   Constitutional: Slightly overweight, in NAD Eyes: EOMI, no exophthalmos ENT: no thyromegaly, no cervical lymphadenopathy Cardiovascular: tachycardia, RR, No MRG, + swelling and pain on palpation in B LE  -wears compression hoses Respiratory: CTA B Musculoskeletal: no deformities Skin: no rashes Neurological: no tremor with outstretched hands  ASSESSMENT: 1. DM2, non-insulin-dependent, uncontrolled, with complications - PVD - cerebro-vascular ds - s/p "mini"strokes - PN  2. HL  PLAN:  1. Patient with history of uncontrolled diabetes, on metformin, SGLT2 inhibitor, weekly GLP-1 receptor agonist, with improved control after switching  from Januvia to Victoza and then to Ozempic.  HbA1c decreased to 6.5%, however, afterwards he started to increase.  At last visit it was improved to 7.8%.  At that time, he was drinking apple juice and I strongly advised him to stop it.  Also, we moved to Jardiance to the morning, as he did not feel a difference when  he was taking it at night.  Vascepa is meter was giving him a meaningless report, listing all of his sugars in 1 long column and I recommended the One Touch Verio reflector [place] meter.  He did not get these but that today's visit, we were able to generate a more meaningful report from his current meter. -At today's visit, reviewing his meter's report, he is only checking blood sugars once a week, when he has to take the Ozempic.  This sugars are usually at or above target.  We discussed that he needs to check more frequently and rotate the check times.  Also, I again strongly advised him to stop juice.  We discussed about healthier alternatives. -At today's visit, he appears not to take Jardiance in the morning but takes it at different times of the day.  I advised him to take it before the first meal of the day.  We can continue the rest of the regimen for now.  If sugars are still high after he stops drinking sweet drinks, we will try to increase Ozempic. -Will also try to get him a CGM-sent a new prescription for the freestyle libre 3 CGM to his pharmacy. - I suggested to:  Patient Instructions  Please continue: - Metformin 500 mg 1x a day - Jardiance 25 mg before b'fast - Ozempic  0.5 mg weekly  STOP JUICE OR OTHER SWEET DRINKS!  Check sugars once a day, rotating check times.  Please return in 4 months with your sugar log.   - we checked his HbA1c: 7.6% (lower) - advised to check sugars at different times of the day - 4x a day, rotating check times - again advised for yearly eye exams >> he is not UTD - return to clinic in 4 months  2. HL -Reviewed latest lipid panel from  12/2022: LDL improved, only slightly above goal, HDL low, triglycerides high: Lab Results  Component Value Date   CHOL 134 12/22/2022   HDL 27.50 (L) 12/22/2022   LDLCALC 93 06/20/2022   LDLDIRECT 59.0 12/22/2022   TRIG 281.0 (H) 12/22/2022   CHOLHDL 5 12/22/2022  -He continues on pravastatin 40 mg daily without side effects  Carlus Pavlov, MD PhD [facility] Endocr[facility]

## 2023-02-02 NOTE — Patient Instructions (Addendum)
Please continue: - Metformin 500 mg 1x a day - Jardiance 25 mg before b'fast - Ozempic  0.5 mg weekly  STOP JUICE OR OTHER SWEET DRINKS!  Check sugars once a day, rotating check times.  Try to schedule a new eye exam.  Try to get the Freestyle Libre CGM.  Please return in 4 months with your sugar log.

## 2023-02-02 NOTE — Addendum Note (Signed)
Addended by: Judd Gaudier on: 02/02/2023 04:02 PM   Modules accepted: Orders

## 2023-02-04 ENCOUNTER — Other Ambulatory Visit: Payer: Self-pay

## 2023-02-04 ENCOUNTER — Ambulatory Visit: Payer: Medicaid Other | Attending: Physical Medicine & Rehabilitation

## 2023-02-04 DIAGNOSIS — M545 Low back pain, unspecified: Secondary | ICD-10-CM | POA: Insufficient documentation

## 2023-02-04 DIAGNOSIS — G8929 Other chronic pain: Secondary | ICD-10-CM | POA: Insufficient documentation

## 2023-02-04 DIAGNOSIS — R293 Abnormal posture: Secondary | ICD-10-CM | POA: Insufficient documentation

## 2023-02-04 DIAGNOSIS — G894 Chronic pain syndrome: Secondary | ICD-10-CM | POA: Diagnosis not present

## 2023-02-04 DIAGNOSIS — Z7409 Other reduced mobility: Secondary | ICD-10-CM | POA: Insufficient documentation

## 2023-02-04 NOTE — Therapy (Signed)
OUTPATIENT PHYSICAL THERAPY THORACOLUMBAR EVALUATION   Patient Name: Chase [name] MRN: [id number] DOB:1961-01-23, 62 y.o., male Today's Date: 02/04/2023  END OF SESSION:  PT End of Session - 02/04/23 1514     Visit Number 1    Date for PT Re-Evaluation 04/29/23    Progress Note Due on Visit 10    PT Start Time 1515    PT Stop Time 1600    PT Time Calculation (min) 45 min    Activity Tolerance Patient tolerated treatment well    Behavior During Therapy [facility] for tasks assessed/performed             Past Medical History:  Diagnosis Date   Abnormality of gait 11/10/2012   Acute GI bleeding    Acute renal insufficiency 02/10/2015   Acute upper respiratory infection 08/10/2012   Allergic drug reaction 05/24/2020   Allergic rhinitis 10/16/2020   Allergy    Anemia 02/10/2015   Annual physical exam 11/26/2011   ANXIETY DEPRESSION 12/09/2007   Qualifier: Diagnosis of  By: Lillia Mountain RN, Brien Few    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) 03/12/2018   Cerebrovascular accident, old 11/16/2014   Chest discomfort 06/08/2019   Chronic back pain 11/28/2020   Chronic cough 08/27/2014   CXR 08/2014:  Mild hyperinflation, no acute process Cleda Daub 09/2014:  Difficulty with procedure, but essentially normal +CVA's in past, with aspiration risk by history Speech evaluation 2016:      Chronic left shoulder pain 08/06/2020   Chronic pain syndrome 11/23/2019   Chronic pansinusitis 06/30/2018   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    Constipation    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   DDD (degenerative disc disease), cervical 04/26/2019   DDD (degenerative disc disease), lumbar 04/26/2019   Decreased hearing of both ears 02/12/2013   Deviated septum 05/31/2018   Diabetes mellitus without complication (HCC)    Difficulty urinating    DM (diabetes mellitus) type II uncontrolled, periph vascular disorder 01/02/2016   Dry skin 05/09/2019   Dysuria 11/29/2010   Essential (primary) hypertension 01/26/2015   Excessive daytime sleepiness 02/11/2018   FATIGUE 12/09/2007   Qualifier: Diagnosis of  By: Lillia Mountain RN, Brien Few    Fibromyalgia 03/02/2017   Food intolerance 05/09/2019   GERD 12/09/2007   Qualifier: Diagnosis of  By: Alphonzo Severance MD, Loni Dolly    GERD (gastroesophageal reflux disease)    GI bleed 02/10/2015   Headache(784.0)    Hearing loss    Hemangioma 02/12/2013   Hemorrhoid 05/22/2011   HTN (hypertension) 02/04/2010   Qualifier: Diagnosis of  By: Abner Greenspan MD, Stacey     Hyperlipidemia    Hyperlipidemia associated with type 2 diabetes mellitus (HCC) 11/22/2020   Hyperlipidemia LDL goal <70 01/26/2015   Hypersomnia, persistent 11/05/2012   epworth of 18 points, was placed on CPAP in 2008 with very low AHI ( no number  Mentioned) and struggles ever since with PAP. 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   Leg swelling    Localized swelling of both lower legs 03/02/2017   Low back pain 07/20/2007   Qualifier: Diagnosis of  By: Alphonzo Severance MD, Loni Dolly    Lower extremity edema 07/04/2020   Lower GI bleed    Melena 12/16/2021   Mixed conductive  and sensorineural hearing loss of right ear with restricted hearing of left ear 10/16/2020   Nasal congestion    Neck pain 03/15/2020   NEPHROLITHIASIS 08/13/2006   Qualifier: Diagnosis of  By: Bradly Bienenstock     Nocturia more than twice per night 02/11/2018   OSA on CPAP 10/17/2014   Otalgia of both ears 06/06/2021   Other chronic pain 03/02/2017   Other fatigue 07/04/2020   Pain in finger of right hand 10/06/2012   Pain in joint of right shoulder 02/21/2017   Palpitations 01/26/2015   Paresthesia 07/25/2010   Persistent atrial fibrillation (HCC) 08/29/2020   Persistent headaches 10/17/2014   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   Qualifier: Diagnosis of  By: Alphonzo Severance MD, Ivar Drape R    PONV (postoperative nausea and vomiting)    Preoperative clearance 07/04/2020   Prolapsed hemorrhoids 10/05/2013   Radiculopathy, lumbar region 03/15/2020   Rash 05/24/2019   Rash and other nonspecific skin eruption 07/23/2022   Raynaud's disease 03/02/2017   Raynaud's phenomenon 03/02/2017   Rectal bleeding 02/12/2015   Rectal pain    Rectal ulcer with bleeding after hemorrhoid banding 02/11/2015   Rhinitis, chronic 12/23/2006   Qualifier: Diagnosis of  By: Lillia Mountain RN, Regina G    Right shoulder pain 02/15/2015   Seasonal and perennial allergic rhinitis 05/09/2019   Severe episode of recurrent major depressive disorder, 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   Sinus infection 06/30/2014   Sinusitis, acute, maxillary 02/02/2014   Sleep apnea    Sleep apnea with use of continuous positive airway pressure (CPAP) 02/11/2018   SOB (shortness of breath) 07/04/2020   Stroke (HCC)    Stroke (HCC)    Subacute confusional state 10/17/2014   SYMPTOM, HYPERSOMNIA NOS 02/19/2007   Qualifier: Diagnosis of  By: Vernie Murders     Thrombosed hemorrhoids    Tick bite of back 11/23/2019   Tinnitus of both ears 08/29/2020   Trouble swallowing    Unspecified hemorrhoids 02/10/2015   Unspecified sleep apnea  11/05/2012   UTI (urinary tract infection) 05/19/2022   Varicose veins of lower extremities with other complications 08/10/2012   Vasomotor rhinitis 06/28/2021   Vertigo 03/19/2012   Past Surgical History:  Procedure Laterality Date   CATARACT EXTRACTION     x 3   EYE SURGERY  1968, 1985, 1987   cataracts   FLEXIBLE SIGMOIDOSCOPY N/A 02/11/2015   Procedure: FLEXIBLE SIGMOIDOSCOPY;  Surgeon: Iva Boop, MD;  Location: [facility];  Service: Endoscopy;  Laterality: N/A;   INNER EAR SURGERY     rt ear   INNER EAR SURGERY  1992   surgery - right arm     1983   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  Increasing residual urine 03/10/2022   Melena 12/16/2021   Vasomotor rhinitis 06/28/2021   Otalgia of both ears 06/06/2021   Brain cyst 01/09/2021   Chronic back pain 11/28/2020   Hyperlipidemia associated with type 2 diabetes mellitus (HCC) 11/22/2020   Mixed conductive and sensorineural hearing loss of right ear with restricted hearing of left ear 10/16/2020   Allergic rhinitis 10/16/2020   Constipation    Hearing loss    Severe episode of recurrent major depressive disorder, 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   Hypokalemia 08/29/2020   BPH without urinary obstruction 08/29/2020   Tinnitus of both ears 08/29/2020   Persistent atrial fibrillation (HCC) 08/29/2020   Chronic left shoulder pain 08/06/2020   Allergy    Arthritis    Blood in stool    Diabetes mellitus without complication (HCC)    GERD (gastroesophageal reflux disease)    Hypertension    Leg swelling    Nasal congestion    PONV (postoperative nausea and vomiting)    Rectal pain    Thrombosed hemorrhoids    Trouble swallowing    Lower extremity edema 07/04/2020   Other fatigue 07/04/2020   Preoperative clearance 07/04/2020   Hypotension 07/04/2020   SOB  (shortness of breath) 07/04/2020   Allergic drug reaction 05/24/2020   Body mass index (BMI) 26.0-26.9, adult 03/15/2020   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   Radiculopathy, lumbar region 03/15/2020   Neck pain 03/15/2020   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/23/2019   Hyperlipidemia 11/23/2019   Bilateral hearing loss 11/23/2019   Tick bite of back 11/23/2019   Chronic pain syndrome 11/23/2019   Chest discomfort 06/08/2019   Benign neoplasm of brain (HCC) 05/24/2019   Rash 05/24/2019   Dry skin 05/09/2019   Seasonal and perennial allergic rhinitis 05/09/2019   Food intolerance 05/09/2019   DDD (degenerative disc disease), cervical 04/26/2019   DDD (degenerative disc disease), lumbar 04/26/2019   Chronic pansinusitis 06/30/2018   Deviated septum 05/31/2018   Insect bite 03/16/2018   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) 03/12/2018   Excessive daytime sleepiness 02/11/2018   Sleep apnea with use of continuous positive airway pressure (CPAP) 02/11/2018   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   Nocturia more than twice per night 02/11/2018   Bilateral leg and foot pain 03/02/2017   Fibromyalgia 03/02/2017   Localized swelling of both lower legs 03/02/2017   Other chronic pain 03/02/2017   Raynaud's disease 03/02/2017   Raynaud's phenomenon 03/02/2017   Pain in joint of right shoulder 02/21/2017   DM (diabetes mellitus) type II uncontrolled, periph vascular disorder 01/02/2016   Right shoulder pain 02/15/2015   Rectal bleeding 02/12/2015   Rectal ulcer with bleeding after hemorrhoid banding 02/11/2015   Lower GI bleed    Blood in stool, frank 02/10/2015   Acute renal insufficiency 02/10/2015   Unspecified hemorrhoids 02/10/2015   GI bleed 02/10/2015   Acute GI bleeding    Stroke (HCC) 01/26/2015   Palpitations 01/26/2015   Essential (primary) hypertension 01/26/2015   Hyperlipidemia LDL goal <70 01/26/2015    Cerebrovascular accident, old 11/16/2014   Internal hemorrhoids 10/22/2014   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 10/17/2014   OSA on CPAP 10/17/2014   Persistent headaches 10/17/2014   Subacute confusional state 10/17/2014   Chronic cough 08/27/2014   Sinus infection 06/30/2014   Sinusitis, acute, maxillary 02/02/2014   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   Prolapsed hemorrhoids 10/05/2013  Hemangioma 02/12/2013   Decreased hearing of both ears 02/12/2013   Abnormality of gait 11/10/2012   Hypersomnia, persistent 11/05/2012   Unspecified sleep apnea 11/05/2012   Pain in finger of right hand 10/06/2012   Internal and external hemorrhoids without complication 10/04/2012   Varicose veins of lower extremities with other complications 08/10/2012   Acute upper respiratory infection 08/10/2012   Bigeminy ? 03/22/2012   Vertigo 03/19/2012   Preventative health care 11/26/2011   Sleep apnea 11/03/2011   Insomnia 08/05/2011   Difficulty urinating    Hemorrhoid 05/22/2011   Dysuria 11/29/2010   Arthralgia 07/25/2010   Paresthesia 07/25/2010   HTN (hypertension) 02/04/2010   ANXIETY DEPRESSION 12/09/2007   GERD 12/09/2007   FATIGUE 12/09/2007   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   Low back pain 07/20/2007   SYMPTOM, HYPERSOMNIA NOS 02/19/2007   Rhinitis, chronic 12/23/2006   HEADACHE 12/23/2006   NEPHROLITHIASIS 08/13/2006    PCP: Seabron Spates, DO  REFERRING PROVIDER: Fanny Dance, MD  REFERRING DIAG: fibromyalgia, chronic pain lower back  Rationale for Evaluation and Treatment: Rehabilitation  THERAPY DIAG:  Chronic midline low back pain without sciatica  Abnormal posture  Impaired functional mobility and activity tolerance  ONSET DATE: chronic  SUBJECTIVE:                                                                                                                                                                                            SUBJECTIVE STATEMENT: I hurt all over,heat helps some  PERTINENT HISTORY:  Chronic lower back pain, referred to PT by pain medicine MD to address LBP  PAIN: level 4 to 8  Are you having pain? Yes: Pain location: lower back, thoracic spine, shoulders, post neck Pain description: aching, burns, Aggravating factors: always hurts Relieving factors: heat  PRECAUTIONS: Fall and Other: brittle diabetic  RED FLAGS: Bowel or bladder incontinence: No, Spinal tumors: Yes: dura Lumbar, Cauda equina syndrome: No, Compression fracture: No, and Abdominal aneurysm: No   WEIGHT BEARING RESTRICTIONS: No  FALLS:  Has patient fallen in last 6 months? Yes. Number of falls 2  LIVING ENVIRONMENT: Lives with: lives with their family and lives with an adult companion Lives in: House/apartment Stairs: No Has following equipment at home: Single point cane  OCCUPATION: disabled  PLOF: Independent with basic ADLs and Needs assistance with homemaking  PATIENT GOALS: get rid of pain  NEXT MD VISIT: 4 weeks  OBJECTIVE:   DIAGNOSTIC FINDINGS: copied from referring clinican notes: chronic lower back pain and chronic neck pain. 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 -Cervical imaging with evidence  of spinal stenosis and foraminal stenosis  PATIENT SURVEYS:  FOTO 31  SCREENING FOR RED FLAGS: Bowel or bladder incontinence: No Spinal tumors: Yes: dura ls region Cauda equina syndrome: no Compression fracture: No Abdominal aneurysm: No  COGNITION: Overall cognitive status: Within functional limits for tasks assessed     SENSATION: Pt reports hypersensitive LE sensation with neuropathy  MUSCLE LENGTH: Hamstrings: Right wnl deg; Left -25 deg  POSTURE: forward head, decreased lumbar lordosis, increased thoracic kyphosis, flexed trunk , and wide base of support  PALPATION: Tender multiple pts paraspinals, shoulders  LUMBAR ROM:   AROM eval   Flexion 60%  Extension To neutral   Right lateral flexion 40%  Left lateral flexion 40%  Right rotation   Left rotation    (Blank rows = not tested)  LOWER EXTREMITY ROM:   B hip flexion to 95 degrees, otherwise wnl LOWER EXTREMITY MMT:    MMT Right eval Left eval  Hip flexion 4- 4-  Hip extension 4- 4-  Hip abduction 4+ 4+  Hip adduction    Hip internal rotation    Hip external rotation    Knee flexion    Knee extension 4- 4-  Ankle dorsiflexion    Ankle plantarflexion    Ankle inversion    Ankle eversion     (Blank rows = not tested)  LUMBAR SPECIAL TESTS:  Straight leg raise test:  positive on L, - R and FABER test: Positive B for LBP  FUNCTIONAL TESTS:  30 seconds chair stand test 7 reps, using B UE's 6 minute walk test: 360', 3 min 11 sec then fatigued  Gait:Comments: no device, wide base of support, pronounced locking L LE when became fatigued  TODAY'S TREATMENT:                                                                                                                              DATE: 02/04/23:  after evaluation, patient positioned in supine with moist heat along lower back, while performing bridges, then instructed in wall slides on wall for thoracic spine extension stretch    PATIENT EDUCATION:  Education details: POC, goals Person educated: Patient Education method: Explanation, Demonstration, Tactile cues, Verbal cues, and Handouts Education comprehension: verbal cues required, tactile cues required, and needs further education  HOME EXERCISE PROGRAM: Access Code: [email or web address] URL: [email or web address] Date: 02/04/2023 Prepared by: Caralee Ates  Exercises - Supine Bridge  - 1 x daily - 7 x weekly - 3 sets - 10 reps - Standing shoulder flexion wall slides  - 1 x daily - 7 x weekly - 3 sets - 10 reps  ASSESSMENT:  CLINICAL IMPRESSION: Patient is a 62 y.o. male who was evaluated today for skilled physical therapy due to chronic lower  back pain and fibromyalgia. He presents with diffuse c/o pain .  Has multiple co morbidities, had increased pain with all aspects of evaluation, but was very pleasant and cooperative.  No consistency noted  with pain pattern regarding activity and position changes.  Noted weakness B quads, fatigued quickly as well, very flexed posture upper thoracic spine.  Recommend gradual gentle progression with flexbility for spine extension and strengthening, endurance for LE's  OBJECTIVE IMPAIRMENTS: decreased activity tolerance, decreased coordination, decreased endurance, decreased knowledge of condition, decreased mobility, difficulty walking, decreased strength, postural dysfunction, and pain.   ACTIVITY LIMITATIONS: carrying, lifting, standing, squatting, transfers, bathing, dressing, and reach over head  PARTICIPATION LIMITATIONS: meal prep, cleaning, laundry, shopping, community activity, and yard work  PERSONAL FACTORS: Behavior pattern, Education, Fitness, Past/current experiences, and 3+ comorbidities: DM, fibromyalgia, chronic neck and lower back pain,  are also affecting patient's functional outcome.   REHAB POTENTIAL: Fair    CLINICAL DECISION MAKING: Evolving/moderate complexity  EVALUATION COMPLEXITY: Moderate   GOALS: Goals reviewed with patient? Yes  SHORT TERM GOALS: Target date: 2 weeks 02/18/23  I HEP Baseline: initiated today Goal status: INITIAL   LONG TERM GOALS: Target date: 04/29/23  FOTO improve from 31 to 41 Baseline:  Goal status: INITIAL  2.  Improve strength B quads to 5/5 for improved transfers, activity tolerance Baseline: 4-/5 B Goal status: INITIAL  3.  30 sec sit to stand improve to 10 Baseline: 8 Goal status: INITIAL  4.  Improve 6 min walk test to over 800' Baseline: 3 min,11 sec and 360' Goal status: INITIAL  PLAN:  PT FREQUENCY: 2x/week  PT DURATION: 12 weeks  PLANNED INTERVENTIONS: Therapeutic exercises, Therapeutic activity, Neuromuscular  re-education, Balance training, Gait training, Patient/Family education, Self Care, and Joint mobilization.  PLAN FOR NEXT SESSION: initiate other cardiovascular traning, gentle stretching to improve spinal extension flexibility, quads, strength   Justis Dupas L Rayfield Beem, PT, DPT, OCS 02/04/2023, 5:46 PM

## 2023-02-05 ENCOUNTER — Other Ambulatory Visit: Payer: Self-pay | Admitting: Family Medicine

## 2023-02-09 ENCOUNTER — Ambulatory Visit: Payer: Medicaid Other

## 2023-02-09 DIAGNOSIS — R293 Abnormal posture: Secondary | ICD-10-CM

## 2023-02-09 DIAGNOSIS — G8929 Other chronic pain: Secondary | ICD-10-CM

## 2023-02-09 DIAGNOSIS — M545 Low back pain, unspecified: Secondary | ICD-10-CM | POA: Diagnosis not present

## 2023-02-09 DIAGNOSIS — Z7409 Other reduced mobility: Secondary | ICD-10-CM

## 2023-02-09 NOTE — Therapy (Signed)
OUTPATIENT PHYSICAL THERAPY THORACOLUMBAR EVALUATION   Patient Name: Chase [name] MRN: [id number] DOB:Sep 03, 1960, 62 y.o., male Today's Date: 02/09/2023  END OF SESSION:    Past Medical History:  Diagnosis Date   Abnormality of gait 11/10/2012   Acute GI bleeding    Acute renal insufficiency 02/10/2015   Acute upper respiratory infection 08/10/2012   Allergic drug reaction 05/24/2020   Allergic rhinitis 10/16/2020   Allergy    Anemia 02/10/2015   Annual physical exam 11/26/2011   ANXIETY DEPRESSION 12/09/2007   Qualifier: Diagnosis of  By: Lillia Mountain RN, Brien Few    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) 03/12/2018   Cerebrovascular accident, old 11/16/2014   Chest discomfort 06/08/2019   Chronic back pain 11/28/2020   Chronic cough 08/27/2014   CXR 08/2014:  Mild hyperinflation, no acute process Cleda Daub 09/2014:  Difficulty with procedure, but essentially normal +CVA's in past, with aspiration risk by history Speech evaluation 2016:      Chronic left shoulder pain 08/06/2020   Chronic pain syndrome 11/23/2019   Chronic pansinusitis 06/30/2018   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   Constipation    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   DDD (degenerative disc disease), cervical 04/26/2019   DDD (degenerative disc disease), lumbar 04/26/2019   Decreased hearing of both ears 02/12/2013   Deviated septum 05/31/2018   Diabetes mellitus without complication (HCC)    Difficulty urinating    DM (diabetes mellitus) type II  uncontrolled, periph vascular disorder 01/02/2016   Dry skin 05/09/2019   Dysuria 11/29/2010   Essential (primary) hypertension 01/26/2015   Excessive daytime sleepiness 02/11/2018   FATIGUE 12/09/2007   Qualifier: Diagnosis of  By: Lillia Mountain RN, Brien Few    Fibromyalgia 03/02/2017   Food intolerance 05/09/2019   GERD 12/09/2007   Qualifier: Diagnosis of  By: Alphonzo Severance MD, Loni Dolly    GERD (gastroesophageal reflux disease)    GI bleed 02/10/2015   Headache(784.0)    Hearing loss    Hemangioma 02/12/2013   Hemorrhoid 05/22/2011   HTN (hypertension) 02/04/2010   Qualifier: Diagnosis of  By: Abner Greenspan MD, Stacey     Hyperlipidemia    Hyperlipidemia associated with type 2 diabetes mellitus (HCC) 11/22/2020   Hyperlipidemia LDL goal <70 01/26/2015   Hypersomnia, persistent 11/05/2012   epworth of 18 points, was placed on CPAP in 2008 with very low AHI ( no number  Mentioned) and struggles ever since with PAP. 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   Leg swelling    Localized swelling of both lower legs 03/02/2017   Low back pain 07/20/2007   Qualifier: Diagnosis of  By: Alphonzo Severance  MD, Loni Dolly    Lower extremity edema 07/04/2020   Lower GI bleed    Melena 12/16/2021   Mixed conductive and sensorineural hearing loss of right ear with restricted hearing of left ear 10/16/2020   Nasal congestion    Neck pain 03/15/2020   NEPHROLITHIASIS 08/13/2006   Qualifier: Diagnosis of  By: Bradly Bienenstock     Nocturia more than twice per night 02/11/2018   OSA on CPAP 10/17/2014   Otalgia of both ears 06/06/2021   Other  chronic pain 03/02/2017   Other fatigue 07/04/2020   Pain in finger of right hand 10/06/2012   Pain in joint of right shoulder 02/21/2017   Palpitations 01/26/2015   Paresthesia 07/25/2010   Persistent atrial fibrillation (HCC) 08/29/2020   Persistent headaches 10/17/2014   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   Qualifier: Diagnosis of  By: Alphonzo Severance MD, Ivar Drape R    PONV (postoperative nausea and vomiting)    Preoperative clearance 07/04/2020   Prolapsed hemorrhoids 10/05/2013   Radiculopathy, lumbar region 03/15/2020   Rash 05/24/2019   Rash and other nonspecific skin eruption 07/23/2022   Raynaud's disease 03/02/2017   Raynaud's phenomenon 03/02/2017   Rectal bleeding 02/12/2015   Rectal pain    Rectal ulcer with bleeding after hemorrhoid banding 02/11/2015   Rhinitis, chronic 12/23/2006   Qualifier: Diagnosis of  By: Lillia Mountain RN, Regina G    Right shoulder pain 02/15/2015   Seasonal and perennial allergic rhinitis 05/09/2019   Severe episode of recurrent major depressive disorder, 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   Sinus infection 06/30/2014   Sinusitis, acute, maxillary 02/02/2014   Sleep apnea    Sleep apnea with use of continuous positive airway pressure (CPAP) 02/11/2018   SOB (shortness of breath) 07/04/2020   Stroke (HCC)    Stroke (HCC)    Subacute confusional state 10/17/2014   SYMPTOM, HYPERSOMNIA NOS 02/19/2007   Qualifier: Diagnosis of  By: Vernie Murders     Thrombosed hemorrhoids    Tick bite of back 11/23/2019   Tinnitus of both ears 08/29/2020   Trouble swallowing    Unspecified hemorrhoids 02/10/2015   Unspecified sleep apnea 11/05/2012   UTI (urinary tract infection) 05/19/2022   Varicose veins of lower extremities with other complications 08/10/2012   Vasomotor rhinitis 06/28/2021   Vertigo 03/19/2012   Past Surgical History:  Procedure Laterality Date   CATARACT EXTRACTION     x 3   EYE SURGERY  1968, 1985, 1987   cataracts   FLEXIBLE  SIGMOIDOSCOPY N/A 02/11/2015   Procedure: FLEXIBLE SIGMOIDOSCOPY;  Surgeon: Iva Boop, MD;  Location: [facility];  Service: Endoscopy;  Laterality: N/A;   INNER EAR SURGERY     rt ear   INNER EAR SURGERY  1992   surgery - right arm     1983   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   Increasing residual urine 03/10/2022   Melena 12/16/2021   Vasomotor rhinitis 06/28/2021   Otalgia of both ears 06/06/2021   Brain cyst 01/09/2021   Chronic back pain 11/28/2020   Hyperlipidemia associated with type 2 diabetes mellitus (HCC) 11/22/2020   Mixed conductive and sensorineural hearing loss of right ear with restricted hearing of left ear 10/16/2020   Allergic rhinitis 10/16/2020   Constipation    Hearing loss    Severe episode of recurrent major depressive  disorder, 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   Hypokalemia 08/29/2020   BPH without urinary obstruction 08/29/2020   Tinnitus of both ears 08/29/2020   Persistent atrial fibrillation (HCC) 08/29/2020   Chronic left shoulder pain 08/06/2020   Allergy    Arthritis    Blood in stool    Diabetes mellitus without complication (HCC)    GERD (gastroesophageal reflux disease)    Hypertension    Leg swelling    Nasal congestion    PONV (postoperative nausea and vomiting)    Rectal pain    Thrombosed hemorrhoids    Trouble swallowing    Lower extremity edema 07/04/2020   Other fatigue 07/04/2020   Preoperative clearance 07/04/2020   Hypotension 07/04/2020   SOB (shortness of breath) 07/04/2020   Allergic drug reaction 05/24/2020   Body mass index (BMI) 26.0-26.9, adult 03/15/2020   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   Radiculopathy, lumbar region 03/15/2020   Neck pain 03/15/2020   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/23/2019   Hyperlipidemia  11/23/2019   Bilateral hearing loss 11/23/2019   Tick bite of back 11/23/2019   Chronic pain syndrome 11/23/2019   Chest discomfort 06/08/2019   Benign neoplasm of brain (HCC) 05/24/2019   Rash 05/24/2019   Dry skin 05/09/2019   Seasonal and perennial allergic rhinitis 05/09/2019   Food intolerance 05/09/2019   DDD (degenerative disc disease), cervical 04/26/2019   DDD (degenerative disc disease), lumbar 04/26/2019   Chronic pansinusitis 06/30/2018   Deviated septum 05/31/2018   Insect bite 03/16/2018   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) 03/12/2018   Excessive daytime sleepiness 02/11/2018   Sleep apnea with use of continuous positive airway pressure (CPAP) 02/11/2018   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   Nocturia more than twice per night 02/11/2018   Bilateral leg and foot pain 03/02/2017   Fibromyalgia 03/02/2017   Localized swelling of both lower legs 03/02/2017   Other chronic pain 03/02/2017   Raynaud's disease 03/02/2017   Raynaud's phenomenon 03/02/2017   Pain in joint of right shoulder 02/21/2017   DM (diabetes mellitus) type II uncontrolled, periph vascular disorder 01/02/2016   Right shoulder pain 02/15/2015   Rectal bleeding 02/12/2015   Rectal ulcer with bleeding after hemorrhoid banding 02/11/2015   Lower GI bleed    Blood in stool, frank 02/10/2015   Acute renal insufficiency 02/10/2015   Unspecified hemorrhoids 02/10/2015   GI bleed 02/10/2015   Acute GI bleeding    Stroke (HCC) 01/26/2015   Palpitations 01/26/2015   Essential (primary) hypertension 01/26/2015   Hyperlipidemia LDL goal <70 01/26/2015   Cerebrovascular accident, old 11/16/2014   Internal hemorrhoids 10/22/2014   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 10/17/2014   OSA on CPAP 10/17/2014   Persistent headaches 10/17/2014   Subacute confusional state 10/17/2014   Chronic cough 08/27/2014   Sinus infection 06/30/2014    Sinusitis, acute, maxillary 02/02/2014   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   Prolapsed hemorrhoids 10/05/2013   Hemangioma 02/12/2013   Decreased hearing of both ears 02/12/2013   Abnormality of gait 11/10/2012   Hypersomnia, persistent 11/05/2012   Unspecified sleep apnea 11/05/2012   Pain in finger of right hand 10/06/2012   Internal and external hemorrhoids without complication 10/04/2012   Varicose veins of lower extremities with other complications 08/10/2012   Acute upper respiratory infection 08/10/2012   Bigeminy ? 03/22/2012   Vertigo 03/19/2012   Preventative health care 11/26/2011  Sleep apnea 11/03/2011   Insomnia 08/05/2011   Difficulty urinating    Hemorrhoid 05/22/2011   Dysuria 11/29/2010   Arthralgia 07/25/2010   Paresthesia 07/25/2010   HTN (hypertension) 02/04/2010   ANXIETY DEPRESSION 12/09/2007   GERD 12/09/2007   FATIGUE 12/09/2007   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   Low back pain 07/20/2007   SYMPTOM, HYPERSOMNIA NOS 02/19/2007   Rhinitis, chronic 12/23/2006   HEADACHE 12/23/2006   NEPHROLITHIASIS 08/13/2006    PCP: Seabron Spates, DO  REFERRING PROVIDER: Fanny Dance, MD  REFERRING DIAG: fibromyalgia, chronic pain lower back  Rationale for Evaluation and Treatment: Rehabilitation  THERAPY DIAG:  No diagnosis found.  ONSET DATE: chronic  SUBJECTIVE:                                                                                                                                                                                           SUBJECTIVE STATEMENT: I hurt all over,heat helps some  PERTINENT HISTORY:  Chronic lower back pain, referred to PT by pain medicine MD to address LBP  PAIN: level 4 to 8  Are you having pain? Yes: Pain location: lower back, thoracic spine, shoulders, post neck Pain description: aching, burns, Aggravating factors: always hurts Relieving factors: heat  PRECAUTIONS: Fall and Other: brittle  diabetic  RED FLAGS: Bowel or bladder incontinence: No, Spinal tumors: Yes: dura Lumbar, Cauda equina syndrome: No, Compression fracture: No, and Abdominal aneurysm: No   WEIGHT BEARING RESTRICTIONS: No  FALLS:  Has patient fallen in last 6 months? Yes. Number of falls 2  LIVING ENVIRONMENT: Lives with: lives with their family and lives with an adult companion Lives in: House/apartment Stairs: No Has following equipment at home: Single point cane  OCCUPATION: disabled  PLOF: Independent with basic ADLs and Needs assistance with homemaking  PATIENT GOALS: get rid of pain  NEXT MD VISIT: 4 weeks  OBJECTIVE:   DIAGNOSTIC FINDINGS: copied from referring clinican notes: chronic lower back pain and chronic neck pain. 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 -Cervical imaging with evidence of spinal stenosis and foraminal stenosis  PATIENT SURVEYS:  FOTO 31  SCREENING FOR RED FLAGS: Bowel or bladder incontinence: No Spinal tumors: Yes: dura ls region Cauda equina syndrome: no Compression fracture: No Abdominal aneurysm: No  COGNITION: Overall cognitive status: Within functional limits for tasks assessed     SENSATION: Pt reports hypersensitive LE sensation with neuropathy  MUSCLE LENGTH: Hamstrings: Right wnl deg; Left -25 deg  POSTURE: forward head, decreased lumbar lordosis, increased thoracic kyphosis, flexed trunk , and wide base of support  PALPATION: Tender multiple pts  paraspinals, shoulders  LUMBAR ROM:   AROM eval  Flexion 60%  Extension To neutral   Right lateral flexion 40%  Left lateral flexion 40%  Right rotation   Left rotation    (Blank rows = not tested)  LOWER EXTREMITY ROM:   B hip flexion to 95 degrees, otherwise wnl LOWER EXTREMITY MMT:    MMT Right eval Left eval  Hip flexion 4- 4-  Hip extension 4- 4-  Hip abduction 4+ 4+  Hip adduction    Hip internal rotation    Hip external rotation     Knee flexion    Knee extension 4- 4-  Ankle dorsiflexion    Ankle plantarflexion    Ankle inversion    Ankle eversion     (Blank rows = not tested)  LUMBAR SPECIAL TESTS:  Straight leg raise test:  positive on L, - R and FABER test: Positive B for LBP  FUNCTIONAL TESTS:  30 seconds chair stand test 7 reps, using B UE's 6 minute walk test: 360', 3 min 11 sec then fatigued  Gait:Comments: no device, wide base of support, pronounced locking L LE when became fatigued  TODAY'S TREATMENT:                                                                                                                              DATE:  02/09/23: therapeutic exercise: Instructed the pt in the following ex to address gentle postural stretching and strengthening for thoracic spine, lumbar spine, quads, hip strength: Supine for bridging , 10 reps Supine B shoulder flexion, with 2 1/2 # cuff weight, cuff st utilized to achieve gentle stretch into thoracic extension and B shoulder flexion Lower trunk rotations, 10 x inst to start with small movements and increase amplitude gradually Alternating long arc quads 10x ea Leg press B 40# 2 sets 10 Seated for lat pulls, 20#, one set 10  02/04/23:  after evaluation, patient positioned in supine with moist heat along lower back, while performing bridges, then instructed in wall slides on wall for thoracic spine extension stretch    PATIENT EDUCATION:  Education details: POC, goals Person educated: Patient Education method: Explanation, Demonstration, Tactile cues, Verbal cues, and Handouts Education comprehension: verbal cues required, tactile cues required, and needs further education  HOME EXERCISE PROGRAM: Access Code: [email or web address] URL: [email or web address] Date: 02/04/2023 Prepared by: Caralee Ates  Exercises - Supine Bridge  - 1 x daily - 7 x weekly - 3 sets - 10 reps - Standing shoulder flexion wall slides  - 1 x daily - 7 x weekly - 3 sets - 10  reps  ASSESSMENT:  CLINICAL IMPRESSION: Patient is a 62 y.o. male who returned  today for skilled physical therapy treatment due to chronic lower back pain and fibromyalgia. He presents with diffuse c/o pain .  Shortened session today due to pt 10 min late.  Education throughout session regarding purpose of therex, emphasis with him on posture  and cardiovascular training to address fibrmyalgia management. Provided written inst again for therex for home.    Recommend ongoing gradual gentle progression with flexbility for spine extension and strengthening, endurance for LE's  OBJECTIVE IMPAIRMENTS: decreased activity tolerance, decreased coordination, decreased endurance, decreased knowledge of condition, decreased mobility, difficulty walking, decreased strength, postural dysfunction, and pain.   ACTIVITY LIMITATIONS: carrying, lifting, standing, squatting, transfers, bathing, dressing, and reach over head  PARTICIPATION LIMITATIONS: meal prep, cleaning, laundry, shopping, community activity, and yard work  PERSONAL FACTORS: Behavior pattern, Education, Fitness, Past/current experiences, and 3+ comorbidities: DM, fibromyalgia, chronic neck and lower back pain,  are also affecting patient's functional outcome.   REHAB POTENTIAL: Fair    CLINICAL DECISION MAKING: Evolving/moderate complexity  EVALUATION COMPLEXITY: Moderate   GOALS: Goals reviewed with patient? Yes  SHORT TERM GOALS: Target date: 2 weeks 02/18/23  I HEP Baseline: initiated today Goal status: INITIAL   LONG TERM GOALS: Target date: 04/29/23  FOTO improve from 31 to 41 Baseline:  Goal status: INITIAL  2.  Improve strength B quads to 5/5 for improved transfers, activity tolerance Baseline: 4-/5 B Goal status: INITIAL  3.  30 sec sit to stand improve to 10 Baseline: 8 Goal status: INITIAL  4.  Improve 6 min walk test to over 800' Baseline: 3 min,11 sec and 360' Goal status: INITIAL  PLAN:  PT FREQUENCY:  2x/week  PT DURATION: 12 weeks  PLANNED INTERVENTIONS: Therapeutic exercises, Therapeutic activity, Neuromuscular re-education, Balance training, Gait training, Patient/Family education, Self Care, and Joint mobilization.  PLAN FOR NEXT SESSION: initiate other cardiovascular traning, gentle stretching to improve spinal extension flexibility, quads, strength   Kaspar Albornoz L Venora Kautzman, PT, DPT, OCS 02/09/2023, 8:08 AM

## 2023-02-12 ENCOUNTER — Ambulatory Visit: Payer: Medicaid Other | Admitting: Physical Therapy

## 2023-02-12 ENCOUNTER — Encounter: Payer: Self-pay | Admitting: Physical Therapy

## 2023-02-12 DIAGNOSIS — Z7409 Other reduced mobility: Secondary | ICD-10-CM

## 2023-02-12 DIAGNOSIS — M545 Low back pain, unspecified: Secondary | ICD-10-CM | POA: Diagnosis not present

## 2023-02-12 DIAGNOSIS — R293 Abnormal posture: Secondary | ICD-10-CM

## 2023-02-12 NOTE — Therapy (Signed)
OUTPATIENT PHYSICAL THERAPY THORACOLUMBAR TREATMENT   Patient Name: Chase [name] MRN: [id number] DOB:1960-09-04, 62 y.o., male Today's Date: 02/12/2023  END OF SESSION:  PT End of Session - 02/12/23 0904     Visit Number 3    Date for PT Re-Evaluation 04/29/23    Progress Note Due on Visit 10    PT Start Time 0854   pt late   PT Stop Time 0931    PT Time Calculation (min) 37 min    Activity Tolerance Patient tolerated treatment well    Behavior During Therapy [facility] for tasks assessed/performed              Past Medical History:  Diagnosis Date   Abnormality of gait 11/10/2012   Acute GI bleeding    Acute renal insufficiency 02/10/2015   Acute upper respiratory infection 08/10/2012   Allergic drug reaction 05/24/2020   Allergic rhinitis 10/16/2020   Allergy    Anemia 02/10/2015   Annual physical exam 11/26/2011   ANXIETY DEPRESSION 12/09/2007   Qualifier: Diagnosis of  By: Lillia Mountain RN, Brien Few    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) 03/12/2018   Cerebrovascular accident, old 11/16/2014   Chest discomfort 06/08/2019   Chronic back pain 11/28/2020   Chronic cough 08/27/2014   CXR 08/2014:  Mild hyperinflation, no acute process Cleda Daub 09/2014:  Difficulty with procedure, but essentially normal +CVA's in past, with aspiration risk by history Speech evaluation 2016:      Chronic left shoulder pain 08/06/2020   Chronic pain syndrome 11/23/2019   Chronic pansinusitis 06/30/2018   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    Constipation    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   DDD (degenerative disc disease), cervical 04/26/2019   DDD (degenerative disc disease), lumbar 04/26/2019   Decreased hearing of both ears 02/12/2013   Deviated septum 05/31/2018   Diabetes mellitus without complication (HCC)    Difficulty urinating    DM (diabetes mellitus) type II uncontrolled, periph vascular disorder 01/02/2016   Dry skin 05/09/2019   Dysuria 11/29/2010   Essential (primary) hypertension 01/26/2015   Excessive daytime sleepiness 02/11/2018   FATIGUE 12/09/2007   Qualifier: Diagnosis of  By: Lillia Mountain RN, Brien Few    Fibromyalgia 03/02/2017   Food intolerance 05/09/2019   GERD 12/09/2007   Qualifier: Diagnosis of  By: Alphonzo Severance MD, Loni Dolly    GERD (gastroesophageal reflux disease)    GI bleed 02/10/2015   Headache(784.0)    Hearing loss    Hemangioma 02/12/2013   Hemorrhoid 05/22/2011   HTN (hypertension) 02/04/2010   Qualifier: Diagnosis of  By: Abner Greenspan MD, Stacey     Hyperlipidemia    Hyperlipidemia associated with type 2 diabetes mellitus (HCC) 11/22/2020   Hyperlipidemia LDL goal <70 01/26/2015   Hypersomnia, persistent 11/05/2012   epworth of 18 points, was placed on CPAP in 2008 with very low AHI ( no number  Mentioned) and struggles ever since with PAP. 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   Leg swelling    Localized swelling of both lower legs 03/02/2017   Low back pain 07/20/2007   Qualifier: Diagnosis of  By: Alphonzo Severance MD, Loni Dolly    Lower extremity edema 07/04/2020   Lower GI bleed    Melena 12/16/2021   Mixed conductive  and sensorineural hearing loss of right ear with restricted hearing of left ear 10/16/2020   Nasal congestion    Neck pain 03/15/2020   NEPHROLITHIASIS 08/13/2006   Qualifier: Diagnosis of  By: Bradly Bienenstock     Nocturia more than twice per night 02/11/2018   OSA on CPAP 10/17/2014   Otalgia of both ears 06/06/2021   Other chronic pain 03/02/2017   Other fatigue 07/04/2020   Pain in finger of right hand 10/06/2012   Pain in joint of right shoulder 02/21/2017   Palpitations 01/26/2015   Paresthesia 07/25/2010   Persistent atrial fibrillation (HCC) 08/29/2020   Persistent headaches 10/17/2014   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   Qualifier: Diagnosis of  By: Alphonzo Severance MD, Ivar Drape R    PONV (postoperative nausea and vomiting)    Preoperative clearance 07/04/2020   Prolapsed hemorrhoids 10/05/2013   Radiculopathy, lumbar region 03/15/2020   Rash 05/24/2019   Rash and other nonspecific skin eruption 07/23/2022   Raynaud's disease 03/02/2017   Raynaud's phenomenon 03/02/2017   Rectal bleeding 02/12/2015   Rectal pain    Rectal ulcer with bleeding after hemorrhoid banding 02/11/2015   Rhinitis, chronic 12/23/2006   Qualifier: Diagnosis of  By: Lillia Mountain RN, Regina G    Right shoulder pain 02/15/2015   Seasonal and perennial allergic rhinitis 05/09/2019   Severe episode of recurrent major depressive disorder, 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   Sinus infection 06/30/2014   Sinusitis, acute, maxillary 02/02/2014   Sleep apnea    Sleep apnea with use of continuous positive airway pressure (CPAP) 02/11/2018   SOB (shortness of breath) 07/04/2020   Stroke (HCC)    Stroke (HCC)    Subacute confusional state 10/17/2014   SYMPTOM, HYPERSOMNIA NOS 02/19/2007   Qualifier: Diagnosis of  By: Vernie Murders     Thrombosed hemorrhoids    Tick bite of back 11/23/2019   Tinnitus of both ears 08/29/2020   Trouble swallowing    Unspecified hemorrhoids 02/10/2015   Unspecified sleep apnea  11/05/2012   UTI (urinary tract infection) 05/19/2022   Varicose veins of lower extremities with other complications 08/10/2012   Vasomotor rhinitis 06/28/2021   Vertigo 03/19/2012   Past Surgical History:  Procedure Laterality Date   CATARACT EXTRACTION     x 3   EYE SURGERY  1968, 1985, 1987   cataracts   FLEXIBLE SIGMOIDOSCOPY N/A 02/11/2015   Procedure: FLEXIBLE SIGMOIDOSCOPY;  Surgeon: Iva Boop, MD;  Location: [facility];  Service: Endoscopy;  Laterality: N/A;   INNER EAR SURGERY     rt ear   INNER EAR SURGERY  1992   surgery - right arm     1983   Patient Active Problem List   Diagnosis Date Noted   Rash and other nonspecific skin eruption 07/23/2022   UTI (urinary tract infection) 05/19/2022  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   Increasing residual urine 03/10/2022   Melena 12/16/2021   Vasomotor rhinitis 06/28/2021   Otalgia of both ears 06/06/2021   Brain cyst 01/09/2021   Chronic back pain 11/28/2020   Hyperlipidemia associated with type 2 diabetes mellitus (HCC) 11/22/2020   Mixed conductive and sensorineural hearing loss of right ear with restricted hearing of left ear 10/16/2020   Allergic rhinitis 10/16/2020   Constipation    Hearing loss    Severe episode of recurrent major depressive disorder, 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   Hypokalemia 08/29/2020   BPH without urinary obstruction 08/29/2020   Tinnitus of both ears 08/29/2020   Persistent atrial fibrillation (HCC) 08/29/2020   Chronic left shoulder pain 08/06/2020   Allergy    Arthritis    Blood in stool    Diabetes mellitus without complication (HCC)    GERD (gastroesophageal reflux disease)    Hypertension    Leg swelling    Nasal congestion    PONV (postoperative nausea and vomiting)    Rectal pain    Thrombosed hemorrhoids    Trouble swallowing    Lower extremity edema 07/04/2020   Other fatigue 07/04/2020   Preoperative clearance 07/04/2020   Hypotension 07/04/2020   SOB  (shortness of breath) 07/04/2020   Allergic drug reaction 05/24/2020   Body mass index (BMI) 26.0-26.9, adult 03/15/2020   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   Radiculopathy, lumbar region 03/15/2020   Neck pain 03/15/2020   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/23/2019   Hyperlipidemia 11/23/2019   Bilateral hearing loss 11/23/2019   Tick bite of back 11/23/2019   Chronic pain syndrome 11/23/2019   Chest discomfort 06/08/2019   Benign neoplasm of brain (HCC) 05/24/2019   Rash 05/24/2019   Dry skin 05/09/2019   Seasonal and perennial allergic rhinitis 05/09/2019   Food intolerance 05/09/2019   DDD (degenerative disc disease), cervical 04/26/2019   DDD (degenerative disc disease), lumbar 04/26/2019   Chronic pansinusitis 06/30/2018   Deviated septum 05/31/2018   Insect bite 03/16/2018   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) 03/12/2018   Excessive daytime sleepiness 02/11/2018   Sleep apnea with use of continuous positive airway pressure (CPAP) 02/11/2018   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   Nocturia more than twice per night 02/11/2018   Bilateral leg and foot pain 03/02/2017   Fibromyalgia 03/02/2017   Localized swelling of both lower legs 03/02/2017   Other chronic pain 03/02/2017   Raynaud's disease 03/02/2017   Raynaud's phenomenon 03/02/2017   Pain in joint of right shoulder 02/21/2017   DM (diabetes mellitus) type II uncontrolled, periph vascular disorder 01/02/2016   Right shoulder pain 02/15/2015   Rectal bleeding 02/12/2015   Rectal ulcer with bleeding after hemorrhoid banding 02/11/2015   Lower GI bleed    Blood in stool, frank 02/10/2015   Acute renal insufficiency 02/10/2015   Unspecified hemorrhoids 02/10/2015   GI bleed 02/10/2015   Acute GI bleeding    Stroke (HCC) 01/26/2015   Palpitations 01/26/2015   Essential (primary) hypertension 01/26/2015   Hyperlipidemia LDL goal <70 01/26/2015    Cerebrovascular accident, old 11/16/2014   Internal hemorrhoids 10/22/2014   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 10/17/2014   OSA on CPAP 10/17/2014   Persistent headaches 10/17/2014   Subacute confusional state 10/17/2014   Chronic cough 08/27/2014   Sinus infection 06/30/2014   Sinusitis, acute, maxillary 02/02/2014   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  Prolapsed hemorrhoids 10/05/2013   Hemangioma 02/12/2013   Decreased hearing of both ears 02/12/2013   Abnormality of gait 11/10/2012   Hypersomnia, persistent 11/05/2012   Unspecified sleep apnea 11/05/2012   Pain in finger of right hand 10/06/2012   Internal and external hemorrhoids without complication 10/04/2012   Varicose veins of lower extremities with other complications 08/10/2012   Acute upper respiratory infection 08/10/2012   Bigeminy ? 03/22/2012   Vertigo 03/19/2012   Preventative health care 11/26/2011   Sleep apnea 11/03/2011   Insomnia 08/05/2011   Difficulty urinating    Hemorrhoid 05/22/2011   Dysuria 11/29/2010   Arthralgia 07/25/2010   Paresthesia 07/25/2010   HTN (hypertension) 02/04/2010   ANXIETY DEPRESSION 12/09/2007   GERD 12/09/2007   FATIGUE 12/09/2007   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   Low back pain 07/20/2007   SYMPTOM, HYPERSOMNIA NOS 02/19/2007   Rhinitis, chronic 12/23/2006   HEADACHE 12/23/2006   NEPHROLITHIASIS 08/13/2006    PCP: Seabron Spates, DO  REFERRING PROVIDER: Fanny Dance, MD  REFERRING DIAG: fibromyalgia, chronic pain lower back  Rationale for Evaluation and Treatment: Rehabilitation  THERAPY DIAG:  Chronic midline low back pain without sciatica  Abnormal posture  Impaired functional mobility and activity tolerance  ONSET DATE: chronic  SUBJECTIVE:                                                                                                                                                                                            SUBJECTIVE STATEMENT: I'm here and I'm accounted for.  Nothing new since last time, I felt sore after last time, it woke my back up. I have aphasia.  PERTINENT HISTORY:  Chronic lower back pain, referred to PT by pain medicine MD to address LBP  PAIN: NPRS 5/10 Are you having pain? Yes: Pain location: lower back Pain description: aching, burns, Aggravating factors: always hurts Relieving factors: heat  PRECAUTIONS: Fall and Other: brittle diabetic, aphasia   RED FLAGS: Bowel or bladder incontinence: No, Spinal tumors: Yes: dura Lumbar, Cauda equina syndrome: No, Compression fracture: No, and Abdominal aneurysm: No   WEIGHT BEARING RESTRICTIONS: No  FALLS:  Has patient fallen in last 6 months? Yes. Number of falls 2  LIVING ENVIRONMENT: Lives with: lives with their family and lives with an adult companion Lives in: House/apartment Stairs: No Has following equipment at home: Single point cane  OCCUPATION: disabled  PLOF: Independent with basic ADLs and Needs assistance with homemaking  PATIENT GOALS: get rid of pain  NEXT MD VISIT: 4 weeks  OBJECTIVE:   DIAGNOSTIC FINDINGS: copied from referring clinican notes: chronic lower back pain and  chronic neck pain. 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 -Cervical imaging with evidence of spinal stenosis and foraminal stenosis  PATIENT SURVEYS:  FOTO 31  SCREENING FOR RED FLAGS: Bowel or bladder incontinence: No Spinal tumors: Yes: dura ls region Cauda equina syndrome: no Compression fracture: No Abdominal aneurysm: No  COGNITION: Overall cognitive status: Within functional limits for tasks assessed     SENSATION: Pt reports hypersensitive LE sensation with neuropathy  MUSCLE LENGTH: Hamstrings: Right wnl deg; Left -25 deg  POSTURE: forward head, decreased lumbar lordosis, increased thoracic kyphosis, flexed trunk , and wide base of support  PALPATION: Tender  multiple pts paraspinals, shoulders  LUMBAR ROM:   AROM eval  Flexion 60%  Extension To neutral   Right lateral flexion 40%  Left lateral flexion 40%  Right rotation   Left rotation    (Blank rows = not tested)  LOWER EXTREMITY ROM:   B hip flexion to 95 degrees, otherwise wnl LOWER EXTREMITY MMT:    MMT Right eval Left eval  Hip flexion 4- 4-  Hip extension 4- 4-  Hip abduction 4+ 4+  Hip adduction    Hip internal rotation    Hip external rotation    Knee flexion    Knee extension 4- 4-  Ankle dorsiflexion    Ankle plantarflexion    Ankle inversion    Ankle eversion     (Blank rows = not tested)  LUMBAR SPECIAL TESTS:  Straight leg raise test:  positive on L, - R and FABER test: Positive B for LBP  FUNCTIONAL TESTS:  30 seconds chair stand test 7 reps, using B UE's 6 minute walk test: 360', 3 min 11 sec then fatigued  Gait:Comments: no device, wide base of support, pronounced locking L LE when became fatigued  TODAY'S TREATMENT:                                                                                                                              DATE:     02/12/23  TherEx  Lumbar rotation 5x3 seconds B SKTC with sheet behind knee- attempted but unable to tolerance due to shoulder pain Bridges x12  QL stretch in sitting 3x30 seconds forward  Lumbar rotation stretch seated 3x10 seconds B  STS with red TB around knees x10  Nustep L4 x6 minutes BLEs SPM >60   02/09/23: therapeutic exercise: Instructed the pt in the following ex to address gentle postural stretching and strengthening for thoracic spine, lumbar spine, quads, hip strength: Supine for bridging , 10 reps Supine B shoulder flexion, with 2 1/2 # cuff weight, cuff st utilized to achieve gentle stretch into thoracic extension and B shoulder flexion Lower trunk rotations, 10 x inst to start with small movements and increase amplitude gradually Alternating long arc quads 10x ea Leg press B 40#  2 sets 10 Seated for lat pulls, 20#, one set 10  02/04/23:  after  evaluation, patient positioned in supine with moist heat along lower back, while performing bridges, then instructed in wall slides on wall for thoracic spine extension stretch    PATIENT EDUCATION:  Education details: POC, goals Person educated: Patient Education method: Explanation, Demonstration, Tactile cues, Verbal cues, and Handouts Education comprehension: verbal cues required, tactile cues required, and needs further education  HOME EXERCISE PROGRAM: Access Code: [email or web address] URL: [email or web address] Date: 02/04/2023 Prepared by: Caralee Ates  Exercises - Supine Bridge  - 1 x daily - 7 x weekly - 3 sets - 10 reps - Standing shoulder flexion wall slides  - 1 x daily - 7 x weekly - 3 sets - 10 reps  ASSESSMENT:  CLINICAL IMPRESSION:  Trey Paula arrives doing OK today. Sounds like he had some post-exercise muscle soreness after last visit as expected. We continued working on general mobility and flexibility within his pain tolerance, also incorporated some strengthening as time allowed. Still having quite a bit of discomfort, will progress mobility as able and tolerated.  OBJECTIVE IMPAIRMENTS: decreased activity tolerance, decreased coordination, decreased endurance, decreased knowledge of condition, decreased mobility, difficulty walking, decreased strength, postural dysfunction, and pain.   ACTIVITY LIMITATIONS: carrying, lifting, standing, squatting, transfers, bathing, dressing, and reach over head  PARTICIPATION LIMITATIONS: meal prep, cleaning, laundry, shopping, community activity, and yard work  PERSONAL FACTORS: Behavior pattern, Education, Fitness, Past/current experiences, and 3+ comorbidities: DM, fibromyalgia, chronic neck and lower back pain,  are also affecting patient's functional outcome.   REHAB POTENTIAL: Fair    CLINICAL DECISION MAKING: Evolving/moderate complexity  EVALUATION  COMPLEXITY: Moderate   GOALS: Goals reviewed with patient? Yes  SHORT TERM GOALS: Target date: 2 weeks 02/18/23  I HEP Baseline: initiated today Goal status: INITIAL   LONG TERM GOALS: Target date: 04/29/23  FOTO improve from 31 to 41 Baseline:  Goal status: INITIAL  2.  Improve strength B quads to 5/5 for improved transfers, activity tolerance Baseline: 4-/5 B Goal status: INITIAL  3.  30 sec sit to stand improve to 10 Baseline: 8 Goal status: INITIAL  4.  Improve 6 min walk test to over 800' Baseline: 3 min,11 sec and 360' Goal status: INITIAL  PLAN:  PT FREQUENCY: 2x/week  PT DURATION: 12 weeks  PLANNED INTERVENTIONS: Therapeutic exercises, Therapeutic activity, Neuromuscular re-education, Balance training, Gait training, Patient/Family education, Self Care, and Joint mobilization.  PLAN FOR NEXT SESSION: initiate other cardiovascular traning, gentle stretching to improve spinal extension flexibility, quads, strength to tolerance   Nedra Hai, PT, DPT 02/12/23 9:31 AM

## 2023-02-17 ENCOUNTER — Ambulatory Visit (INDEPENDENT_AMBULATORY_CARE_PROVIDER_SITE_OTHER): Payer: Medicaid Other | Admitting: Family Medicine

## 2023-02-17 ENCOUNTER — Encounter: Payer: Self-pay | Admitting: Physical Therapy

## 2023-02-17 ENCOUNTER — Ambulatory Visit: Payer: Medicaid Other | Attending: Physical Medicine & Rehabilitation | Admitting: Physical Therapy

## 2023-02-17 VITALS — BP 130/76 | HR 90 | Temp 98.2°F | Resp 18 | Ht 69.0 in | Wt 179.2 lb

## 2023-02-17 DIAGNOSIS — R293 Abnormal posture: Secondary | ICD-10-CM | POA: Diagnosis present

## 2023-02-17 DIAGNOSIS — Z7409 Other reduced mobility: Secondary | ICD-10-CM | POA: Insufficient documentation

## 2023-02-17 DIAGNOSIS — L03032 Cellulitis of left toe: Secondary | ICD-10-CM | POA: Diagnosis not present

## 2023-02-17 DIAGNOSIS — G8929 Other chronic pain: Secondary | ICD-10-CM | POA: Diagnosis present

## 2023-02-17 DIAGNOSIS — M545 Low back pain, unspecified: Secondary | ICD-10-CM | POA: Diagnosis present

## 2023-02-17 DIAGNOSIS — Z8739 Personal history of other diseases of the musculoskeletal system and connective tissue: Secondary | ICD-10-CM

## 2023-02-17 MED ORDER — DOXYCYCLINE HYCLATE 100 MG PO TABS
100.0000 mg | ORAL_TABLET | Freq: Two times a day (BID) | ORAL | 0 refills | Status: DC
Start: 2023-02-17 — End: 2023-02-27

## 2023-02-17 NOTE — Therapy (Signed)
OUTPATIENT PHYSICAL THERAPY THORACOLUMBAR TREATMENT   Patient Name: Chase [name] MRN: [id number] DOB:1961/04/30, 62 y.o., male Today's Date: 02/17/2023  END OF SESSION:  PT End of Session - 02/17/23 1407     Visit Number 4    Date for PT Re-Evaluation 04/29/23    Progress Note Due on Visit 10    PT Start Time 1346    PT Stop Time 1430    PT Time Calculation (min) 44 min    Activity Tolerance Patient tolerated treatment well    Behavior During Therapy [facility] for tasks assessed/performed               Past Medical History:  Diagnosis Date   Abnormality of gait 11/10/2012   Acute GI bleeding    Acute renal insufficiency 02/10/2015   Acute upper respiratory infection 08/10/2012   Allergic drug reaction 05/24/2020   Allergic rhinitis 10/16/2020   Allergy    Anemia 02/10/2015   Annual physical exam 11/26/2011   ANXIETY DEPRESSION 12/09/2007   Qualifier: Diagnosis of  By: Lillia Mountain RN, Brien Few    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) 03/12/2018   Cerebrovascular accident, old 11/16/2014   Chest discomfort 06/08/2019   Chronic back pain 11/28/2020   Chronic cough 08/27/2014   CXR 08/2014:  Mild hyperinflation, no acute process Cleda Daub 09/2014:  Difficulty with procedure, but essentially normal +CVA's in past, with aspiration risk by history Speech evaluation 2016:      Chronic left shoulder pain 08/06/2020   Chronic pain syndrome 11/23/2019   Chronic pansinusitis 06/30/2018   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    Constipation    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   DDD (degenerative disc disease), cervical 04/26/2019   DDD (degenerative disc disease), lumbar 04/26/2019   Decreased hearing of both ears 02/12/2013   Deviated septum 05/31/2018   Diabetes mellitus without complication (HCC)    Difficulty urinating    DM (diabetes mellitus) type II uncontrolled, periph vascular disorder 01/02/2016   Dry skin 05/09/2019   Dysuria 11/29/2010   Essential (primary) hypertension 01/26/2015   Excessive daytime sleepiness 02/11/2018   FATIGUE 12/09/2007   Qualifier: Diagnosis of  By: Lillia Mountain RN, Brien Few    Fibromyalgia 03/02/2017   Food intolerance 05/09/2019   GERD 12/09/2007   Qualifier: Diagnosis of  By: Alphonzo Severance MD, Loni Dolly    GERD (gastroesophageal reflux disease)    GI bleed 02/10/2015   Headache(784.0)    Hearing loss    Hemangioma 02/12/2013   Hemorrhoid 05/22/2011   HTN (hypertension) 02/04/2010   Qualifier: Diagnosis of  By: Abner Greenspan MD, Stacey     Hyperlipidemia    Hyperlipidemia associated with type 2 diabetes mellitus (HCC) 11/22/2020   Hyperlipidemia LDL goal <70 01/26/2015   Hypersomnia, persistent 11/05/2012   epworth of 18 points, was placed on CPAP in 2008 with very low AHI ( no number  Mentioned) and struggles ever since with PAP. 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   Leg swelling    Localized swelling of both lower legs 03/02/2017   Low back pain 07/20/2007   Qualifier: Diagnosis of  By: Alphonzo Severance MD, Loni Dolly    Lower extremity edema 07/04/2020   Lower GI bleed    Melena 12/16/2021   Mixed conductive  and sensorineural hearing loss of right ear with restricted hearing of left ear 10/16/2020   Nasal congestion    Neck pain 03/15/2020   NEPHROLITHIASIS 08/13/2006   Qualifier: Diagnosis of  By: Bradly Bienenstock     Nocturia more than twice per night 02/11/2018   OSA on CPAP 10/17/2014   Otalgia of both ears 06/06/2021   Other chronic pain 03/02/2017   Other fatigue 07/04/2020   Pain in finger of right hand 10/06/2012   Pain in joint of right shoulder 02/21/2017   Palpitations 01/26/2015   Paresthesia 07/25/2010   Persistent atrial fibrillation (HCC) 08/29/2020   Persistent headaches 10/17/2014   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   Qualifier: Diagnosis of  By: Alphonzo Severance MD, Ivar Drape R    PONV (postoperative nausea and vomiting)    Preoperative clearance 07/04/2020   Prolapsed hemorrhoids 10/05/2013   Radiculopathy, lumbar region 03/15/2020   Rash 05/24/2019   Rash and other nonspecific skin eruption 07/23/2022   Raynaud's disease 03/02/2017   Raynaud's phenomenon 03/02/2017   Rectal bleeding 02/12/2015   Rectal pain    Rectal ulcer with bleeding after hemorrhoid banding 02/11/2015   Rhinitis, chronic 12/23/2006   Qualifier: Diagnosis of  By: Lillia Mountain RN, Regina G    Right shoulder pain 02/15/2015   Seasonal and perennial allergic rhinitis 05/09/2019   Severe episode of recurrent major depressive disorder, 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   Sinus infection 06/30/2014   Sinusitis, acute, maxillary 02/02/2014   Sleep apnea    Sleep apnea with use of continuous positive airway pressure (CPAP) 02/11/2018   SOB (shortness of breath) 07/04/2020   Stroke (HCC)    Stroke (HCC)    Subacute confusional state 10/17/2014   SYMPTOM, HYPERSOMNIA NOS 02/19/2007   Qualifier: Diagnosis of  By: Vernie Murders     Thrombosed hemorrhoids    Tick bite of back 11/23/2019   Tinnitus of both ears 08/29/2020   Trouble swallowing    Unspecified hemorrhoids 02/10/2015   Unspecified sleep apnea  11/05/2012   UTI (urinary tract infection) 05/19/2022   Varicose veins of lower extremities with other complications 08/10/2012   Vasomotor rhinitis 06/28/2021   Vertigo 03/19/2012   Past Surgical History:  Procedure Laterality Date   CATARACT EXTRACTION     x 3   EYE SURGERY  1968, 1985, 1987   cataracts   FLEXIBLE SIGMOIDOSCOPY N/A 02/11/2015   Procedure: FLEXIBLE SIGMOIDOSCOPY;  Surgeon: Iva Boop, MD;  Location: [facility];  Service: Endoscopy;  Laterality: N/A;   INNER EAR SURGERY     rt ear   INNER EAR SURGERY  1992   surgery - right arm     1983   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  Increasing residual urine 03/10/2022   Melena 12/16/2021   Vasomotor rhinitis 06/28/2021   Otalgia of both ears 06/06/2021   Brain cyst 01/09/2021   Chronic back pain 11/28/2020   Hyperlipidemia associated with type 2 diabetes mellitus (HCC) 11/22/2020   Mixed conductive and sensorineural hearing loss of right ear with restricted hearing of left ear 10/16/2020   Allergic rhinitis 10/16/2020   Constipation    Hearing loss    Severe episode of recurrent major depressive disorder, 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   Hypokalemia 08/29/2020   BPH without urinary obstruction 08/29/2020   Tinnitus of both ears 08/29/2020   Persistent atrial fibrillation (HCC) 08/29/2020   Chronic left shoulder pain 08/06/2020   Allergy    Arthritis    Blood in stool    Diabetes mellitus without complication (HCC)    GERD (gastroesophageal reflux disease)    Hypertension    Leg swelling    Nasal congestion    PONV (postoperative nausea and vomiting)    Rectal pain    Thrombosed hemorrhoids    Trouble swallowing    Lower extremity edema 07/04/2020   Other fatigue 07/04/2020   Preoperative clearance 07/04/2020   Hypotension 07/04/2020   SOB  (shortness of breath) 07/04/2020   Allergic drug reaction 05/24/2020   Body mass index (BMI) 26.0-26.9, adult 03/15/2020   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   Radiculopathy, lumbar region 03/15/2020   Neck pain 03/15/2020   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/23/2019   Hyperlipidemia 11/23/2019   Bilateral hearing loss 11/23/2019   Tick bite of back 11/23/2019   Chronic pain syndrome 11/23/2019   Chest discomfort 06/08/2019   Benign neoplasm of brain (HCC) 05/24/2019   Rash 05/24/2019   Dry skin 05/09/2019   Seasonal and perennial allergic rhinitis 05/09/2019   Food intolerance 05/09/2019   DDD (degenerative disc disease), cervical 04/26/2019   DDD (degenerative disc disease), lumbar 04/26/2019   Chronic pansinusitis 06/30/2018   Deviated septum 05/31/2018   Insect bite 03/16/2018   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) 03/12/2018   Excessive daytime sleepiness 02/11/2018   Sleep apnea with use of continuous positive airway pressure (CPAP) 02/11/2018   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   Nocturia more than twice per night 02/11/2018   Bilateral leg and foot pain 03/02/2017   Fibromyalgia 03/02/2017   Localized swelling of both lower legs 03/02/2017   Other chronic pain 03/02/2017   Raynaud's disease 03/02/2017   Raynaud's phenomenon 03/02/2017   Pain in joint of right shoulder 02/21/2017   DM (diabetes mellitus) type II uncontrolled, periph vascular disorder 01/02/2016   Right shoulder pain 02/15/2015   Rectal bleeding 02/12/2015   Rectal ulcer with bleeding after hemorrhoid banding 02/11/2015   Lower GI bleed    Blood in stool, frank 02/10/2015   Acute renal insufficiency 02/10/2015   Unspecified hemorrhoids 02/10/2015   GI bleed 02/10/2015   Acute GI bleeding    Stroke (HCC) 01/26/2015   Palpitations 01/26/2015   Essential (primary) hypertension 01/26/2015   Hyperlipidemia LDL goal <70 01/26/2015    Cerebrovascular accident, old 11/16/2014   Internal hemorrhoids 10/22/2014   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 10/17/2014   OSA on CPAP 10/17/2014   Persistent headaches 10/17/2014   Subacute confusional state 10/17/2014   Chronic cough 08/27/2014   Sinus infection 06/30/2014   Sinusitis, acute, maxillary 02/02/2014   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   Prolapsed hemorrhoids 10/05/2013  Hemangioma 02/12/2013   Decreased hearing of both ears 02/12/2013   Abnormality of gait 11/10/2012   Hypersomnia, persistent 11/05/2012   Unspecified sleep apnea 11/05/2012   Pain in finger of right hand 10/06/2012   Internal and external hemorrhoids without complication 10/04/2012   Varicose veins of lower extremities with other complications 08/10/2012   Acute upper respiratory infection 08/10/2012   Bigeminy ? 03/22/2012   Vertigo 03/19/2012   Preventative health care 11/26/2011   Sleep apnea 11/03/2011   Insomnia 08/05/2011   Difficulty urinating    Hemorrhoid 05/22/2011   Dysuria 11/29/2010   Arthralgia 07/25/2010   Paresthesia 07/25/2010   HTN (hypertension) 02/04/2010   ANXIETY DEPRESSION 12/09/2007   GERD 12/09/2007   FATIGUE 12/09/2007   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   Low back pain 07/20/2007   SYMPTOM, HYPERSOMNIA NOS 02/19/2007   Rhinitis, chronic 12/23/2006   HEADACHE 12/23/2006   NEPHROLITHIASIS 08/13/2006    PCP: Seabron Spates, DO  REFERRING PROVIDER: Fanny Dance, MD  REFERRING DIAG: fibromyalgia, chronic pain lower back  Rationale for Evaluation and Treatment: Rehabilitation  THERAPY DIAG:  Chronic midline low back pain without sciatica  Abnormal posture  Impaired functional mobility and activity tolerance  ONSET DATE: chronic  SUBJECTIVE:                                                                                                                                                                                            SUBJECTIVE STATEMENT:  I was somewhat sore after last time but it was tolerable.   PERTINENT HISTORY:  Chronic lower back pain, referred to PT by pain medicine MD to address LBP  PAIN: NPRS "not bad right now, took tylenol before I came" /10 Are you having pain? Yes: Pain location: lower back Pain description: aching, burns, Aggravating factors: always hurts Relieving factors: heat  PRECAUTIONS: Fall and Other: brittle diabetic, aphasia   RED FLAGS: Bowel or bladder incontinence: No, Spinal tumors: Yes: dura Lumbar, Cauda equina syndrome: No, Compression fracture: No, and Abdominal aneurysm: No   WEIGHT BEARING RESTRICTIONS: No  FALLS:  Has patient fallen in last 6 months? Yes. Number of falls 2  LIVING ENVIRONMENT: Lives with: lives with their family and lives with an adult companion Lives in: House/apartment Stairs: No Has following equipment at home: Single point cane  OCCUPATION: disabled  PLOF: Independent with basic ADLs and Needs assistance with homemaking  PATIENT GOALS: get rid of pain  NEXT MD VISIT: 4 weeks  OBJECTIVE:   DIAGNOSTIC FINDINGS: copied from referring clinican notes: chronic lower back pain and chronic neck pain. 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 -Cervical imaging with evidence of spinal stenosis and foraminal stenosis  PATIENT SURVEYS:  FOTO 31  SCREENING FOR RED FLAGS: Bowel or bladder incontinence: No Spinal tumors: Yes: dura ls region Cauda equina syndrome: no Compression fracture: No Abdominal aneurysm: No  COGNITION: Overall cognitive status: Within functional limits for tasks assessed     SENSATION: Pt reports hypersensitive LE sensation with neuropathy  MUSCLE LENGTH: Hamstrings: Right wnl deg; Left -25 deg  POSTURE: forward head, decreased lumbar lordosis, increased thoracic kyphosis, flexed trunk , and wide base of support  PALPATION: Tender multiple pts  paraspinals, shoulders  LUMBAR ROM:   AROM eval  Flexion 60%  Extension To neutral   Right lateral flexion 40%  Left lateral flexion 40%  Right rotation   Left rotation    (Blank rows = not tested)  LOWER EXTREMITY ROM:   B hip flexion to 95 degrees, otherwise wnl LOWER EXTREMITY MMT:    MMT Right eval Left eval  Hip flexion 4- 4-  Hip extension 4- 4-  Hip abduction 4+ 4+  Hip adduction    Hip internal rotation    Hip external rotation    Knee flexion    Knee extension 4- 4-  Ankle dorsiflexion    Ankle plantarflexion    Ankle inversion    Ankle eversion     (Blank rows = not tested)  LUMBAR SPECIAL TESTS:  Straight leg raise test:  positive on L, - R and FABER test: Positive B for LBP  FUNCTIONAL TESTS:  30 seconds chair stand test 7 reps, using B UE's 6 minute walk test: 360', 3 min 11 sec then fatigued  Gait:Comments: no device, wide base of support, pronounced locking L LE when became fatigued  TODAY'S TREATMENT:                                                                                                                              DATE:   02/17/23  TherEx  Lumbar rotation stretch edge of mat table 3x10 seconds B Lumbar flexion stretch palms to floor 2x30 seconds STS no UEs x12 Standing with one foot on 8 inch step/other on blue foam pad 3x30 seconds B Forward step ups on 4 inch step step no UEs up to ModA for balance x10B Thoracic extensions against wall for posture x12 Standing on blue foam pad narrow BOS 3x30 seconds EO Examined L great toe due to reports of edema and redness/increased pain- does have increased edema and tenderness L great toe, no wounds or drainage noted but may have possible cellulitis (possible ingrown toenail?), recommended having MD check toe as soon as he is able. Able to move toe within available range given edema.  Also send PCP message regarding this in epic secure messenger  Nustep L5 x4 minutes BLEs  only       02/12/23  TherEx  Lumbar rotation 5x3 seconds B SKTC with sheet behind  knee- attempted but unable to tolerance due to shoulder pain Bridges x12  QL stretch in sitting 3x30 seconds forward  Lumbar rotation stretch seated 3x10 seconds B  STS with red TB around knees x10  Nustep L4 x6 minutes BLEs SPM >60   02/09/23: therapeutic exercise: Instructed the pt in the following ex to address gentle postural stretching and strengthening for thoracic spine, lumbar spine, quads, hip strength: Supine for bridging , 10 reps Supine B shoulder flexion, with 2 1/2 # cuff weight, cuff st utilized to achieve gentle stretch into thoracic extension and B shoulder flexion Lower trunk rotations, 10 x inst to start with small movements and increase amplitude gradually Alternating long arc quads 10x ea Leg press B 40# 2 sets 10 Seated for lat pulls, 20#, one set 10  02/04/23:  after evaluation, patient positioned in supine with moist heat along lower back, while performing bridges, then instructed in wall slides on wall for thoracic spine extension stretch    PATIENT EDUCATION:  Education details: POC, goals Person educated: Patient Education method: Explanation, Demonstration, Tactile cues, Verbal cues, and Handouts Education comprehension: verbal cues required, tactile cues required, and needs further education  HOME EXERCISE PROGRAM: Access Code: [email or web address] URL: [email or web address] Date: 02/04/2023 Prepared by: Caralee Ates  Exercises - Supine Bridge  - 1 x daily - 7 x weekly - 3 sets - 10 reps - Standing shoulder flexion wall slides  - 1 x daily - 7 x weekly - 3 sets - 10 reps  ASSESSMENT:  CLINICAL IMPRESSION:  Trey Paula arrives doing OK today, sounds like he had some muscle soreness from challenges in last PT session. Continued working on lumbar flexibility as well and functional strengthening as tolerated. Incorporated some balance training per pt request today as  well. Will continue to progress as able.   OBJECTIVE IMPAIRMENTS: decreased activity tolerance, decreased coordination, decreased endurance, decreased knowledge of condition, decreased mobility, difficulty walking, decreased strength, postural dysfunction, and pain.   ACTIVITY LIMITATIONS: carrying, lifting, standing, squatting, transfers, bathing, dressing, and reach over head  PARTICIPATION LIMITATIONS: meal prep, cleaning, laundry, shopping, community activity, and yard work  PERSONAL FACTORS: Behavior pattern, Education, Fitness, Past/current experiences, and 3+ comorbidities: DM, fibromyalgia, chronic neck and lower back pain,  are also affecting patient's functional outcome.   REHAB POTENTIAL: Fair    CLINICAL DECISION MAKING: Evolving/moderate complexity  EVALUATION COMPLEXITY: Moderate   GOALS: Goals reviewed with patient? Yes  SHORT TERM GOALS: Target date: 2 weeks 02/18/23  I HEP Baseline: initiated today Goal status: INITIAL   LONG TERM GOALS: Target date: 04/29/23  FOTO improve from 31 to 41 Baseline:  Goal status: INITIAL  2.  Improve strength B quads to 5/5 for improved transfers, activity tolerance Baseline: 4-/5 B Goal status: INITIAL  3.  30 sec sit to stand improve to 10 Baseline: 8 Goal status: INITIAL  4.  Improve 6 min walk test to over 800' Baseline: 3 min,11 sec and 360' Goal status: INITIAL  PLAN:  PT FREQUENCY: 2x/week  PT DURATION: 12 weeks  PLANNED INTERVENTIONS: Therapeutic exercises, Therapeutic activity, Neuromuscular re-education, Balance training, Gait training, Patient/Family education, Self Care, and Joint mobilization.  PLAN FOR NEXT SESSION: initiate other cardiovascular traning, gentle stretching to improve spinal extension flexibility, quads, strength to tolerance, balance. How is toe doing?   Nedra Hai, PT, DPT 02/17/23 2:30 PM

## 2023-02-18 ENCOUNTER — Encounter: Payer: Self-pay | Admitting: Family Medicine

## 2023-02-18 LAB — COMPREHENSIVE METABOLIC PANEL
ALT: 43 U/L (ref 0–53)
AST: 27 U/L (ref 0–37)
Albumin: 4.3 g/dL (ref 3.5–5.2)
Alkaline Phosphatase: 82 U/L (ref 39–117)
BUN: 8 mg/dL (ref 6–23)
CO2: 32 meq/L (ref 19–32)
Calcium: 9.4 mg/dL (ref 8.4–10.5)
Chloride: 100 meq/L (ref 96–112)
Creatinine, Ser: 1.06 mg/dL (ref 0.40–1.50)
GFR: 75.23 mL/min (ref >60.00)
Glucose, Bld: 134 mg/dL — ABNORMAL HIGH (ref 70–99)
Potassium: 3.6 meq/L (ref 3.5–5.1)
Sodium: 141 meq/L (ref 135–145)
Total Bilirubin: 0.6 mg/dL (ref 0.2–1.2)
Total Protein: 7.2 g/dL (ref 6.0–8.3)

## 2023-02-18 LAB — URIC ACID: Uric Acid, Serum: 2.4 mg/dL — ABNORMAL LOW (ref 4.0–7.8)

## 2023-02-18 NOTE — Progress Notes (Signed)
Established Patient Office Visit  Subjective   Patient ID: Chase [name], male    DOB: January 01, 1961  Age: 62 y.o. MRN: [id number]  Chief Complaint  Patient presents with   Toe Pain    Left big toe    HPI Discussed the use of AI scribe software for clinical note transcription with the patient, who gave verbal consent to proceed.  History of Present Illness   The patient presents with a painful, swollen toe of approximately one week's duration. The pain is generalized, but particularly sensitive on the sides of the toe. The patient reports difficulty moving the toe due to pain. She has a history of gout and is currently on allopurinol for management. She also has a history of uric acid kidney stones.  In addition, the patient has diabetes and recently started using the [facility] 3 glucose monitor. She reports an issue with the device bleeding through the patch, requiring her to remove it. She plans to contact the company for a replacement.     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   Increasing residual urine 03/10/2022   Melena 12/16/2021   Vasomotor rhinitis 06/28/2021   Otalgia of both ears 06/06/2021   Brain cyst 01/09/2021   Chronic back pain 11/28/2020   Hyperlipidemia associated with type 2 diabetes mellitus (HCC) 11/22/2020   Mixed conductive and sensorineural hearing loss of right ear with restricted hearing of left ear 10/16/2020   Allergic rhinitis 10/16/2020   Constipation    Hearing loss    Severe episode of recurrent major depressive disorder, 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   Hypokalemia 08/29/2020   BPH without urinary obstruction 08/29/2020   Tinnitus of both ears 08/29/2020   Persistent atrial fibrillation (HCC) 08/29/2020   Chronic left shoulder pain 08/06/2020   Allergy    Arthritis    Blood in stool     Diabetes mellitus without complication (HCC)    GERD (gastroesophageal reflux disease)    Hypertension    Leg swelling    Nasal congestion    PONV (postoperative nausea and vomiting)    Rectal pain    Thrombosed hemorrhoids    Trouble swallowing    Lower extremity edema 07/04/2020   Other fatigue 07/04/2020   Preoperative clearance 07/04/2020   Hypotension 07/04/2020   SOB (shortness of breath) 07/04/2020   Allergic drug reaction 05/24/2020   Body mass index (BMI) 26.0-26.9, adult 03/15/2020   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   Radiculopathy, lumbar region 03/15/2020   Neck pain 03/15/2020   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/23/2019   Hyperlipidemia 11/23/2019   Bilateral hearing loss 11/23/2019   Tick bite of back 11/23/2019   Chronic pain syndrome 11/23/2019   Chest discomfort 06/08/2019   Benign neoplasm of brain (HCC) 05/24/2019   Rash 05/24/2019   Dry skin 05/09/2019   Seasonal and perennial allergic rhinitis 05/09/2019   Food intolerance 05/09/2019   DDD (degenerative disc disease), cervical 04/26/2019   DDD (degenerative disc disease), lumbar 04/26/2019   Chronic pansinusitis 06/30/2018   Deviated septum 05/31/2018   Insect bite 03/16/2018   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) 03/12/2018   Excessive daytime sleepiness 02/11/2018   Sleep apnea with use of continuous positive airway pressure (CPAP) 02/11/2018   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   Nocturia more than  twice per night 02/11/2018   Bilateral leg and foot pain 03/02/2017   Fibromyalgia 03/02/2017   Localized swelling of both lower legs 03/02/2017   Other chronic pain 03/02/2017   Raynaud's disease 03/02/2017   Raynaud's phenomenon 03/02/2017   Pain in joint of right shoulder 02/21/2017   DM (diabetes mellitus) type II uncontrolled, periph vascular disorder 01/02/2016   Right shoulder pain 02/15/2015   Rectal bleeding 02/12/2015    Rectal ulcer with bleeding after hemorrhoid banding 02/11/2015   Lower GI bleed    Blood in stool, frank 02/10/2015   Acute renal insufficiency 02/10/2015   Unspecified hemorrhoids 02/10/2015   GI bleed 02/10/2015   Acute GI bleeding    Stroke (HCC) 01/26/2015   Palpitations 01/26/2015   Essential (primary) hypertension 01/26/2015   Hyperlipidemia LDL goal <70 01/26/2015   Cerebrovascular accident, old 11/16/2014   Internal hemorrhoids 10/22/2014   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 10/17/2014   OSA on CPAP 10/17/2014   Persistent headaches 10/17/2014   Subacute confusional state 10/17/2014   Chronic cough 08/27/2014   Sinus infection 06/30/2014   Sinusitis, acute, maxillary 02/02/2014   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   Prolapsed hemorrhoids 10/05/2013   Hemangioma 02/12/2013   Decreased hearing of both ears 02/12/2013   Abnormality of gait 11/10/2012   Hypersomnia, persistent 11/05/2012   Unspecified sleep apnea 11/05/2012   Pain in finger of right hand 10/06/2012   Internal and external hemorrhoids without complication 10/04/2012   Varicose veins of lower extremities with other complications 08/10/2012   Acute upper respiratory infection 08/10/2012   Bigeminy ? 03/22/2012   Vertigo 03/19/2012   Preventative health care 11/26/2011   Sleep apnea 11/03/2011   Insomnia 08/05/2011   Difficulty urinating    Hemorrhoid 05/22/2011   Dysuria 11/29/2010   Arthralgia 07/25/2010   Paresthesia 07/25/2010   HTN (hypertension) 02/04/2010   ANXIETY DEPRESSION 12/09/2007   GERD 12/09/2007   FATIGUE 12/09/2007   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   Low back pain 07/20/2007   SYMPTOM, HYPERSOMNIA NOS 02/19/2007   Rhinitis, chronic 12/23/2006   HEADACHE 12/23/2006   NEPHROLITHIASIS 08/13/2006   Past Medical History:  Diagnosis Date   Abnormality of gait 11/10/2012   Acute GI bleeding    Acute renal insufficiency 02/10/2015   Acute upper  respiratory infection 08/10/2012   Allergic drug reaction 05/24/2020   Allergic rhinitis 10/16/2020   Allergy    Anemia 02/10/2015   Annual physical exam 11/26/2011   ANXIETY DEPRESSION 12/09/2007   Qualifier: Diagnosis of  By: Lillia Mountain RN, Brien Few    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) 03/12/2018   Cerebrovascular accident, old 11/16/2014   Chest discomfort 06/08/2019   Chronic back pain 11/28/2020   Chronic cough 08/27/2014   CXR 08/2014:  Mild hyperinflation, no acute process Cleda Daub 09/2014:  Difficulty with procedure, but essentially normal +CVA's in past, with aspiration risk by history Speech evaluation 2016:      Chronic left shoulder pain 08/06/2020   Chronic  pain syndrome 11/23/2019   Chronic pansinusitis 06/30/2018   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   Constipation    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   DDD (degenerative disc disease), cervical 04/26/2019   DDD (degenerative disc disease), lumbar 04/26/2019   Decreased hearing of both ears 02/12/2013   Deviated septum 05/31/2018   Diabetes mellitus without complication (HCC)    Difficulty urinating    DM (diabetes mellitus) type II uncontrolled, periph vascular disorder 01/02/2016   Dry skin 05/09/2019   Dysuria 11/29/2010   Essential (primary) hypertension 01/26/2015   Excessive daytime sleepiness 02/11/2018   FATIGUE 12/09/2007   Qualifier: Diagnosis of  By: Letta Kocher    Fibromyalgia 03/02/2017   Food intolerance 05/09/2019   GERD 12/09/2007    Qualifier: Diagnosis of  By: Alphonzo Severance MD, Loni Dolly    GERD (gastroesophageal reflux disease)    GI bleed 02/10/2015   Headache(784.0)    Hearing loss    Hemangioma 02/12/2013   Hemorrhoid 05/22/2011   HTN (hypertension) 02/04/2010   Qualifier: Diagnosis of  By: Abner Greenspan MD, Stacey     Hyperlipidemia    Hyperlipidemia associated with type 2 diabetes mellitus (HCC) 11/22/2020   Hyperlipidemia LDL goal <70 01/26/2015   Hypersomnia, persistent 11/05/2012   epworth of 18 points, was placed on CPAP in 2008 with very low AHI ( no number  Mentioned) and struggles ever since with PAP. 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   Leg swelling    Localized swelling of both lower legs 03/02/2017   Low back pain 07/20/2007   Qualifier: Diagnosis of  By: Alphonzo Severance MD, Loni Dolly    Lower extremity edema 07/04/2020   Lower GI bleed    Melena 12/16/2021   Mixed conductive and sensorineural hearing loss of right ear with restricted hearing of left ear 10/16/2020   Nasal congestion    Neck pain 03/15/2020   NEPHROLITHIASIS 08/13/2006   Qualifier: Diagnosis of  By: Bradly Bienenstock     Nocturia more than twice per night 02/11/2018   OSA on CPAP 10/17/2014   Otalgia of both ears 06/06/2021   Other chronic pain 03/02/2017   Other fatigue 07/04/2020   Pain in finger of right hand 10/06/2012   Pain in joint of right shoulder 02/21/2017   Palpitations 01/26/2015   Paresthesia 07/25/2010   Persistent atrial fibrillation (HCC) 08/29/2020   Persistent headaches 10/17/2014   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   Qualifier: Diagnosis of  By:  Alphonzo Severance MD, Ivar Drape R    PONV (postoperative nausea and vomiting)    Preoperative clearance 07/04/2020   Prolapsed hemorrhoids 10/05/2013   Radiculopathy, lumbar region 03/15/2020   Rash 05/24/2019   Rash and other nonspecific skin eruption 07/23/2022   Raynaud's disease 03/02/2017   Raynaud's phenomenon 03/02/2017   Rectal bleeding 02/12/2015   Rectal pain    Rectal ulcer with bleeding after hemorrhoid banding 02/11/2015   Rhinitis, chronic 12/23/2006   Qualifier: Diagnosis of  By: Lillia Mountain RN, Regina G    Right shoulder pain 02/15/2015   Seasonal and perennial allergic rhinitis 05/09/2019   Severe  episode of recurrent major depressive disorder, 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   Sinus infection 06/30/2014   Sinusitis, acute, maxillary 02/02/2014   Sleep apnea    Sleep apnea with use of continuous positive airway pressure (CPAP) 02/11/2018   SOB (shortness of breath) 07/04/2020   Stroke (HCC)    Stroke (HCC)    Subacute confusional state 10/17/2014   SYMPTOM, HYPERSOMNIA NOS 02/19/2007   Qualifier: Diagnosis of  By: Vernie Murders     Thrombosed hemorrhoids    Tick bite of back 11/23/2019   Tinnitus of both ears 08/29/2020   Trouble swallowing    Unspecified hemorrhoids 02/10/2015   Unspecified sleep apnea 11/05/2012   UTI (urinary tract infection) 05/19/2022   Varicose veins of lower extremities with other complications 08/10/2012   Vasomotor rhinitis 06/28/2021   Vertigo 03/19/2012   Past Surgical History:  Procedure Laterality Date   CATARACT EXTRACTION     x 3   EYE SURGERY  1968, 1985, 1987   cataracts   FLEXIBLE SIGMOIDOSCOPY N/A 02/11/2015   Procedure: FLEXIBLE SIGMOIDOSCOPY;  Surgeon: Iva Boop, MD;  Location: [facility];  Service: Endoscopy;  Laterality: N/A;   INNER EAR SURGERY     rt ear   INNER EAR SURGERY  1992   surgery - right arm     1983   Social History   Tobacco Use   Smoking status: Never   Smokeless tobacco: Never  Vaping  Use   Vaping status: Never Used  Substance Use Topics   Alcohol use: No    Alcohol/week: 0.0 standard drinks of alcohol    Comment: none   Drug use: No   Social History   Socioeconomic History   Marital status: Single    Spouse name: Not on file   Number of children: 0   Years of education: Not on file   Highest education level: 12th grade  Occupational History   Not on file  Tobacco Use   Smoking status: Never   Smokeless tobacco: Never  Vaping Use   Vaping status: Never Used  Substance and Sexual Activity   Alcohol use: No    Alcohol/week: 0.0 standard drinks of alcohol    Comment: none   Drug use: No   Sexual activity: Never  Other Topics Concern   Not on file  Social History Narrative   No caffeine intake except for chocolate.  Exercised-less walking    Social Determinants of Health   Financial Resource Strain: Medium Risk (02/17/2023)   Overall Financial Resource Strain (CARDIA)    Difficulty of Paying Living Expenses: Somewhat hard  Food Insecurity: Food Insecurity Present (02/17/2023)   Hunger Vital Sign    Worried About Running Out of Food in the Last Year: Sometimes true    Ran Out of Food in the Last Year: Patient declined  Transportation Needs: Patient Declined (02/17/2023)   PRAPARE - Administrator, Civil Service (Medical): Patient declined    Lack of Transportation (Non-Medical): Patient declined  Physical Activity: Unknown (02/17/2023)   Exercise Vital Sign    Days of Exercise per Week: Patient declined    Minutes of Exercise per Session: Not on file  Stress: Stress Concern Present (02/17/2023)   [employer] of Occupational Health - Occupational Stress Questionnaire    Feeling of Stress : To some extent  Social Connections: Unknown (02/17/2023)   Social Connection and Isolation Panel [NHANES]    Frequency of Communication with Friends and Family: Patient declined  Frequency of Social Gatherings with Friends and Family: Patient declined     Attends Religious Services: Patient declined    Active Member of Clubs or Organizations: Patient declined    Attends Banker Meetings: Not on file    Marital Status: Patient declined  Intimate Partner Violence: Unknown (01/17/2022)   Received from [employer], [facility]   HITS    Physically Hurt: Not on file    Insult or Talk Down To: Not on file    Threaten Physical Harm: Not on file    Scream or Curse: Not on file   Family Status  Relation Name Status   Mother  Deceased at age 33   Father  Deceased at age 43   Brother 1 Alive   Neg Hx  (Not Specified)  No partnership data on file   Family History  Problem Relation Age of Onset   Breast cancer Mother 80       breast   Huntington's disease Mother    Stroke Father    Heart disease Father    Hypertension Father    Heart attack Father    Hypertension Brother    Hyperlipidemia Neg Hx    Diabetes Neg Hx    Allergies  Allergen Reactions   Amoxicillin Hives, Itching and Other (See Comments)    White tongue; ? hives   Sulfa Antibiotics Hives and Rash   Terfenadine     ? reaction      Review of Systems  Constitutional:  Negative for fever and malaise/fatigue.  HENT:  Negative for congestion.   Eyes:  Negative for blurred vision.  Respiratory:  Negative for cough and shortness of breath.   Cardiovascular:  Negative for chest pain, palpitations and leg swelling.  Gastrointestinal:  Negative for vomiting.  Musculoskeletal:  Negative for back pain.  Skin:  Negative for rash.  Neurological:  Negative for loss of consciousness and headaches.      Objective:     BP 130/76   Pulse 90   Temp 98.2 F (36.8 C)   Resp 18   Ht 5\' 9"  (1.753 m)   Wt 179 lb 3.2 oz (81.3 kg)   SpO2 100%   BMI 26.46 kg/m  BP Readings from Last 3 Encounters:  02/17/23 130/76  02/02/23 132/80  01/15/23 109/72   Wt Readings from Last 3 Encounters:  02/17/23 179 lb 3.2 oz (81.3 kg)  02/02/23 176 lb (79.8 kg)  01/15/23  174 lb 12.8 oz (79.3 kg)   SpO2 Readings from Last 3 Encounters:  02/17/23 100%  02/02/23 97%  01/15/23 97%      Physical Exam Vitals and nursing note reviewed.  Constitutional:      General: He is not in acute distress.    Appearance: Normal appearance. He is well-developed.  HENT:     Head: Normocephalic and atraumatic.  Eyes:     General: No scleral icterus.       Right eye: No discharge.        Left eye: No discharge.  Cardiovascular:     Rate and Rhythm: Normal rate and regular rhythm.     Heart sounds: No murmur heard. Pulmonary:     Effort: Pulmonary effort is normal. No respiratory distress.     Breath sounds: Normal breath sounds.  Musculoskeletal:        General: Normal range of motion.     Cervical back: Normal range of motion and neck supple.     Right  lower leg: No edema.     Left lower leg: No edema.  Skin:    General: Skin is warm and dry.  Neurological:     Mental Status: He is alert and oriented to person, place, and time.  Psychiatric:        Mood and Affect: Mood normal.        Behavior: Behavior normal.        Thought Content: Thought content normal.        Judgment: Judgment normal.      No results found for any visits on 02/17/23.  Last CBC Lab Results  Component Value Date   WBC 8.4 12/22/2022   HGB 13.4 12/22/2022   HCT 41.1 12/22/2022   MCV 90.4 12/22/2022   MCH 30.4 06/20/2022   RDW 13.1 12/22/2022   PLT 316.0 12/22/2022   Last metabolic panel Lab Results  Component Value Date   GLUCOSE 145 (H) 12/22/2022   NA 142 12/22/2022   K 3.9 12/22/2022   CL 101 12/22/2022   CO2 32 12/22/2022   BUN 8 12/22/2022   CREATININE 0.95 12/22/2022   GFR 85.89 12/22/2022   CALCIUM 9.6 12/22/2022   PHOS 3.5 02/11/2015   PROT 6.7 12/22/2022   ALBUMIN 4.3 12/22/2022   BILITOT 0.7 12/22/2022   ALKPHOS 80 12/22/2022   AST 22 12/22/2022   ALT 27 12/22/2022   ANIONGAP 9 02/12/2015   Last lipids Lab Results  Component Value Date   CHOL  134 12/22/2022   HDL 27.50 (L) 12/22/2022   LDLCALC 93 06/20/2022   LDLDIRECT 59.0 12/22/2022   TRIG 281.0 (H) 12/22/2022   CHOLHDL 5 12/22/2022   Last hemoglobin A1c Lab Results  Component Value Date   HGBA1C 8.2 (A) 02/02/2023   Last thyroid functions Lab Results  Component Value Date   TSH 2.47 12/22/2022   Last vitamin D No results found for: "25OHVITD2", "25OHVITD3", "VD25OH" Last vitamin B12 and Folate Lab Results  Component Value Date   VITAMINB12 389 07/03/2020   FOLATE 22.6 02/10/2015    The ASCVD Risk score (Arnett DK, et al., 2019) failed to calculate for the following reasons:   The patient has a prior MI or stroke diagnosis    Assessment & Plan:   Problem List Items Addressed This Visit   None Visit Diagnoses     Cellulitis of toe of left foot    -  Primary   Relevant Medications   doxycycline (VIBRA-TABS) 100 MG tablet   History of gout       Relevant Orders   Comprehensive metabolic panel   Uric acid     Assessment and Plan    Toe Pain/Swelling Suspected gout flare with possible superimposed infection. History of gout and uric acid kidney stones. Pain and swelling present for approximately one week. -Order labs today to check uric acid level. -Prescribe antibiotic to cover possible infection, to be picked up at [facility].  Diabetes Recent issue with Libre 3 sensor bleeding and detachment. -Advise patient to contact company for replacement sensor.        No follow-ups on file.    Donato Schultz, DO

## 2023-02-19 ENCOUNTER — Encounter: Payer: Self-pay | Admitting: Physical Therapy

## 2023-02-19 ENCOUNTER — Ambulatory Visit: Payer: Medicaid Other | Admitting: Physical Therapy

## 2023-02-19 DIAGNOSIS — Z7409 Other reduced mobility: Secondary | ICD-10-CM

## 2023-02-19 DIAGNOSIS — G8929 Other chronic pain: Secondary | ICD-10-CM

## 2023-02-19 DIAGNOSIS — R293 Abnormal posture: Secondary | ICD-10-CM

## 2023-02-19 DIAGNOSIS — M545 Low back pain, unspecified: Secondary | ICD-10-CM | POA: Diagnosis not present

## 2023-02-19 NOTE — Therapy (Signed)
OUTPATIENT PHYSICAL THERAPY THORACOLUMBAR TREATMENT   Patient Name: Chase [name] MRN: [id number] DOB:January 18, 1961, 62 y.o., male Today's Date: 02/19/2023  END OF SESSION:  PT End of Session - 02/19/23 1021     Visit Number 5    Date for PT Re-Evaluation 04/29/23    Progress Note Due on Visit 10    PT Start Time 1021   pt a few minutes late   PT Stop Time 1059    PT Time Calculation (min) 38 min    Activity Tolerance Patient tolerated treatment well    Behavior During Therapy [facility] for tasks assessed/performed                Past Medical History:  Diagnosis Date   Abnormality of gait 11/10/2012   Acute GI bleeding    Acute renal insufficiency 02/10/2015   Acute upper respiratory infection 08/10/2012   Allergic drug reaction 05/24/2020   Allergic rhinitis 10/16/2020   Allergy    Anemia 02/10/2015   Annual physical exam 11/26/2011   ANXIETY DEPRESSION 12/09/2007   Qualifier: Diagnosis of  By: Lillia Mountain RN, Brien Few    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) 03/12/2018   Cerebrovascular accident, old 11/16/2014   Chest discomfort 06/08/2019   Chronic back pain 11/28/2020   Chronic cough 08/27/2014   CXR 08/2014:  Mild hyperinflation, no acute process Cleda Daub 09/2014:  Difficulty with procedure, but essentially normal +CVA's in past, with aspiration risk by history Speech evaluation 2016:      Chronic left shoulder pain 08/06/2020   Chronic pain syndrome 11/23/2019   Chronic pansinusitis 06/30/2018   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   Complex sleep apnea  syndrome 02/11/2018   Constipation    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   DDD (degenerative disc disease), cervical 04/26/2019   DDD (degenerative disc disease), lumbar 04/26/2019   Decreased hearing of both ears 02/12/2013   Deviated septum 05/31/2018   Diabetes mellitus without complication (HCC)    Difficulty urinating    DM (diabetes mellitus) type II uncontrolled, periph vascular disorder 01/02/2016   Dry skin 05/09/2019   Dysuria 11/29/2010   Essential (primary) hypertension 01/26/2015   Excessive daytime sleepiness 02/11/2018   FATIGUE 12/09/2007   Qualifier: Diagnosis of  By: Lillia Mountain RN, Brien Few    Fibromyalgia 03/02/2017   Food intolerance 05/09/2019   GERD 12/09/2007   Qualifier: Diagnosis of  By: Alphonzo Severance MD, Loni Dolly    GERD (gastroesophageal reflux disease)    GI bleed 02/10/2015   Headache(784.0)    Hearing loss    Hemangioma 02/12/2013   Hemorrhoid 05/22/2011   HTN (hypertension) 02/04/2010   Qualifier: Diagnosis of  By: Abner Greenspan MD, Stacey     Hyperlipidemia    Hyperlipidemia associated with type 2 diabetes mellitus (HCC) 11/22/2020   Hyperlipidemia LDL goal <70 01/26/2015   Hypersomnia, persistent 11/05/2012   epworth of 18 points, was placed on CPAP in 2008 with very low AHI ( no number  Mentioned) and struggles ever since with PAP. 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   Leg swelling    Localized swelling of both lower legs 03/02/2017   Low back pain 07/20/2007   Qualifier: Diagnosis of  By: Alphonzo Severance MD, Loni Dolly    Lower extremity edema 07/04/2020   Lower GI bleed    Melena  12/16/2021   Mixed conductive and sensorineural hearing loss of right ear with restricted hearing of left ear 10/16/2020   Nasal congestion    Neck pain 03/15/2020   NEPHROLITHIASIS 08/13/2006   Qualifier: Diagnosis of  By: Bradly Bienenstock     Nocturia more than twice per night 02/11/2018   OSA on CPAP 10/17/2014   Otalgia of both ears 06/06/2021   Other chronic pain 03/02/2017   Other fatigue 07/04/2020   Pain in finger of right hand 10/06/2012   Pain in joint of right shoulder 02/21/2017   Palpitations 01/26/2015   Paresthesia 07/25/2010   Persistent atrial fibrillation (HCC) 08/29/2020   Persistent headaches 10/17/2014   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   Qualifier: Diagnosis of  By: Alphonzo Severance MD, Ivar Drape R    PONV (postoperative nausea and vomiting)    Preoperative clearance 07/04/2020   Prolapsed hemorrhoids 10/05/2013   Radiculopathy, lumbar region 03/15/2020   Rash 05/24/2019   Rash and other nonspecific skin eruption 07/23/2022   Raynaud's disease 03/02/2017   Raynaud's phenomenon 03/02/2017   Rectal bleeding 02/12/2015   Rectal pain    Rectal ulcer with bleeding after hemorrhoid banding 02/11/2015   Rhinitis, chronic 12/23/2006   Qualifier: Diagnosis of  By: Lillia Mountain RN, Regina G    Right shoulder pain 02/15/2015   Seasonal and perennial allergic rhinitis 05/09/2019   Severe episode of recurrent major depressive disorder, 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   Sinus infection 06/30/2014   Sinusitis, acute, maxillary 02/02/2014   Sleep apnea    Sleep apnea with use of continuous positive airway pressure (CPAP) 02/11/2018   SOB (shortness of breath) 07/04/2020   Stroke (HCC)    Stroke (HCC)    Subacute confusional state 10/17/2014   SYMPTOM, HYPERSOMNIA NOS 02/19/2007   Qualifier: Diagnosis of  By: Vernie Murders     Thrombosed hemorrhoids    Tick bite of back 11/23/2019   Tinnitus of both ears 08/29/2020   Trouble swallowing    Unspecified hemorrhoids  02/10/2015   Unspecified sleep apnea 11/05/2012   UTI (urinary tract infection) 05/19/2022   Varicose veins of lower extremities with other complications 08/10/2012   Vasomotor rhinitis 06/28/2021   Vertigo 03/19/2012   Past Surgical History:  Procedure Laterality Date   CATARACT EXTRACTION     x 3   EYE SURGERY  1968, 1985, 1987   cataracts   FLEXIBLE SIGMOIDOSCOPY N/A 02/11/2015   Procedure: FLEXIBLE SIGMOIDOSCOPY;  Surgeon: Iva Boop, MD;  Location: [facility];  Service: Endoscopy;  Laterality: N/A;   INNER EAR SURGERY     rt ear   INNER EAR SURGERY  1992   surgery - right arm     1983   Patient Active Problem List   Diagnosis Date Noted   Rash and other nonspecific skin eruption 07/23/2022   UTI (urinary tract  infection) 05/19/2022   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   Increasing residual urine 03/10/2022   Melena 12/16/2021   Vasomotor rhinitis 06/28/2021   Otalgia of both ears 06/06/2021   Brain cyst 01/09/2021   Chronic back pain 11/28/2020   Hyperlipidemia associated with type 2 diabetes mellitus (HCC) 11/22/2020   Mixed conductive and sensorineural hearing loss of right ear with restricted hearing of left ear 10/16/2020   Allergic rhinitis 10/16/2020   Constipation    Hearing loss    Severe episode of recurrent major depressive disorder, 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   Hypokalemia 08/29/2020   BPH without urinary obstruction 08/29/2020   Tinnitus of both ears 08/29/2020   Persistent atrial fibrillation (HCC) 08/29/2020   Chronic left shoulder pain 08/06/2020   Allergy    Arthritis    Blood in stool    Diabetes mellitus without complication (HCC)    GERD (gastroesophageal reflux disease)    Hypertension    Leg swelling    Nasal congestion    PONV (postoperative nausea and vomiting)    Rectal pain    Thrombosed hemorrhoids    Trouble swallowing    Lower extremity edema 07/04/2020   Other fatigue 07/04/2020   Preoperative clearance  07/04/2020   Hypotension 07/04/2020   SOB (shortness of breath) 07/04/2020   Allergic drug reaction 05/24/2020   Body mass index (BMI) 26.0-26.9, adult 03/15/2020   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   Radiculopathy, lumbar region 03/15/2020   Neck pain 03/15/2020   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/23/2019   Hyperlipidemia 11/23/2019   Bilateral hearing loss 11/23/2019   Tick bite of back 11/23/2019   Chronic pain syndrome 11/23/2019   Chest discomfort 06/08/2019   Benign neoplasm of brain (HCC) 05/24/2019   Rash 05/24/2019   Dry skin 05/09/2019   Seasonal and perennial allergic rhinitis 05/09/2019   Food intolerance 05/09/2019   DDD (degenerative disc disease), cervical 04/26/2019   DDD (degenerative disc disease), lumbar 04/26/2019   Chronic pansinusitis 06/30/2018   Deviated septum 05/31/2018   Insect bite 03/16/2018   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) 03/12/2018   Excessive daytime sleepiness 02/11/2018   Sleep apnea with use of continuous positive airway pressure (CPAP) 02/11/2018   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   Nocturia more than twice per night 02/11/2018   Bilateral leg and foot pain 03/02/2017   Fibromyalgia 03/02/2017   Localized swelling of both lower legs 03/02/2017   Other chronic pain 03/02/2017   Raynaud's disease 03/02/2017   Raynaud's phenomenon 03/02/2017   Pain in joint of right shoulder 02/21/2017   DM (diabetes mellitus) type II uncontrolled, periph vascular disorder 01/02/2016   Right shoulder pain 02/15/2015   Rectal bleeding 02/12/2015   Rectal ulcer with bleeding after hemorrhoid banding 02/11/2015   Lower GI bleed    Blood in stool, frank 02/10/2015   Acute renal insufficiency 02/10/2015   Unspecified hemorrhoids 02/10/2015   GI bleed 02/10/2015   Acute GI bleeding    Stroke (HCC) 01/26/2015   Palpitations 01/26/2015   Essential (primary) hypertension 01/26/2015    Hyperlipidemia LDL goal <70 01/26/2015   Cerebrovascular accident, old 11/16/2014   Internal hemorrhoids 10/22/2014   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 10/17/2014   OSA on CPAP 10/17/2014   Persistent headaches 10/17/2014   Subacute confusional state 10/17/2014   Chronic cough 08/27/2014   Sinus infection 06/30/2014   Sinusitis, acute, maxillary 02/02/2014  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   Prolapsed hemorrhoids 10/05/2013   Hemangioma 02/12/2013   Decreased hearing of both ears 02/12/2013   Abnormality of gait 11/10/2012   Hypersomnia, persistent 11/05/2012   Unspecified sleep apnea 11/05/2012   Pain in finger of right hand 10/06/2012   Internal and external hemorrhoids without complication 10/04/2012   Varicose veins of lower extremities with other complications 08/10/2012   Acute upper respiratory infection 08/10/2012   Bigeminy ? 03/22/2012   Vertigo 03/19/2012   Preventative health care 11/26/2011   Sleep apnea 11/03/2011   Insomnia 08/05/2011   Difficulty urinating    Hemorrhoid 05/22/2011   Dysuria 11/29/2010   Arthralgia 07/25/2010   Paresthesia 07/25/2010   HTN (hypertension) 02/04/2010   ANXIETY DEPRESSION 12/09/2007   GERD 12/09/2007   FATIGUE 12/09/2007   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   Low back pain 07/20/2007   SYMPTOM, HYPERSOMNIA NOS 02/19/2007   Rhinitis, chronic 12/23/2006   HEADACHE 12/23/2006   NEPHROLITHIASIS 08/13/2006    PCP: Seabron Spates, DO  REFERRING PROVIDER: Fanny Dance, MD  REFERRING DIAG: fibromyalgia, chronic pain lower back  Rationale for Evaluation and Treatment: Rehabilitation  THERAPY DIAG:  Chronic midline low back pain without sciatica  Abnormal posture  Impaired functional mobility and activity tolerance  ONSET DATE: chronic  SUBJECTIVE:                                                                                                                                                                                            SUBJECTIVE STATEMENT:  Saw the doctor they did some tests and and uric acid was low. Toe is still really sore. Lower back has been bothering me, mid/upper back too.    PERTINENT HISTORY:  Chronic lower back pain, referred to PT by pain medicine MD to address LBP  PAIN: NPRS 6-7 /10 Are you having pain? Yes: Pain location: lower back/upper-mid back, L great toe  Pain description: aching, burning in back Aggravating factors: always hurts Relieving factors: heat  PRECAUTIONS: Fall and Other: brittle diabetic, aphasia   RED FLAGS: Bowel or bladder incontinence: No, Spinal tumors: Yes: dura Lumbar, Cauda equina syndrome: No, Compression fracture: No, and Abdominal aneurysm: No   WEIGHT BEARING RESTRICTIONS: No  FALLS:  Has patient fallen in last 6 months? Yes. Number of falls 2  LIVING ENVIRONMENT: Lives with: lives with their family and lives with an adult companion Lives in: House/apartment Stairs: No Has following equipment at home: Single point cane  OCCUPATION: disabled  PLOF: Independent with basic ADLs and Needs assistance with homemaking  PATIENT GOALS: get rid of pain  NEXT MD VISIT: 4  weeks  OBJECTIVE:   DIAGNOSTIC FINDINGS: copied from referring clinican notes: chronic lower back pain and chronic neck pain. 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 -Cervical imaging with evidence of spinal stenosis and foraminal stenosis  PATIENT SURVEYS:  FOTO 31  SCREENING FOR RED FLAGS: Bowel or bladder incontinence: No Spinal tumors: Yes: dura ls region Cauda equina syndrome: no Compression fracture: No Abdominal aneurysm: No  COGNITION: Overall cognitive status: Within functional limits for tasks assessed     SENSATION: Pt reports hypersensitive LE sensation with neuropathy  MUSCLE LENGTH: Hamstrings: Right wnl deg; Left -25 deg  POSTURE: forward head, decreased lumbar  lordosis, increased thoracic kyphosis, flexed trunk , and wide base of support  PALPATION: Tender multiple pts paraspinals, shoulders  LUMBAR ROM:   AROM eval  Flexion 60%  Extension To neutral   Right lateral flexion 40%  Left lateral flexion 40%  Right rotation   Left rotation    (Blank rows = not tested)  LOWER EXTREMITY ROM:   B hip flexion to 95 degrees, otherwise wnl LOWER EXTREMITY MMT:    MMT Right eval Left eval  Hip flexion 4- 4-  Hip extension 4- 4-  Hip abduction 4+ 4+  Hip adduction    Hip internal rotation    Hip external rotation    Knee flexion    Knee extension 4- 4-  Ankle dorsiflexion    Ankle plantarflexion    Ankle inversion    Ankle eversion     (Blank rows = not tested)  LUMBAR SPECIAL TESTS:  Straight leg raise test:  positive on L, - R and FABER test: Positive B for LBP  FUNCTIONAL TESTS:  30 seconds chair stand test 7 reps, using B UE's 6 minute walk test: 360', 3 min 11 sec then fatigued  Gait:Comments: no device, wide base of support, pronounced locking L LE when became fatigued  TODAY'S TREATMENT:                                                                                                                              DATE:   02/19/23  TherEx  Scifit bike seat 13 L4 x5 minutes  UBE L4 x5 min backwards  Lumbar rotations 5x5 seconds B  SKTC 3x5 seconds B by PT due to shoulder pain  Figure 4 stretch 2x30 seconds B  QL stretch forward 2x30 seconds  Thoracic flexion/extension and lateral flexion ROM x12 each      02/17/23  TherEx  Lumbar rotation stretch edge of mat table 3x10 seconds B Lumbar flexion stretch palms to floor 2x30 seconds STS no UEs x12 Standing with one foot on 8 inch step/other on blue foam pad 3x30 seconds B Forward step ups on 4 inch step step no UEs up to ModA for balance x10B Thoracic extensions against wall for posture x12 Standing on blue foam pad narrow BOS 3x30 seconds EO Examined L great toe  due to reports of edema and redness/increased pain- does have increased edema and tenderness L great toe, no wounds or drainage noted but may have possible cellulitis (possible ingrown toenail?), recommended having MD check toe as soon as he is able. Able to move toe within available range given edema.  Also send PCP message regarding this in epic secure messenger  Nustep L5 x4 minutes BLEs only       02/12/23  TherEx  Lumbar rotation 5x3 seconds B SKTC with sheet behind knee- attempted but unable to tolerance due to shoulder pain Bridges x12  QL stretch in sitting 3x30 seconds forward  Lumbar rotation stretch seated 3x10 seconds B  STS with red TB around knees x10  Nustep L4 x6 minutes BLEs SPM >60   02/09/23: therapeutic exercise: Instructed the pt in the following ex to address gentle postural stretching and strengthening for thoracic spine, lumbar spine, quads, hip strength: Supine for bridging , 10 reps Supine B shoulder flexion, with 2 1/2 # cuff weight, cuff st utilized to achieve gentle stretch into thoracic extension and B shoulder flexion Lower trunk rotations, 10 x inst to start with small movements and increase amplitude gradually Alternating long arc quads 10x ea Leg press B 40# 2 sets 10 Seated for lat pulls, 20#, one set 10  02/04/23:  after evaluation, patient positioned in supine with moist heat along lower back, while performing bridges, then instructed in wall slides on wall for thoracic spine extension stretch    PATIENT EDUCATION:  Education details: POC, goals Person educated: Patient Education method: Explanation, Demonstration, Tactile cues, Verbal cues, and Handouts Education comprehension: verbal cues required, tactile cues required, and needs further education  HOME EXERCISE PROGRAM: Access Code: [email or web address] URL: [email or web address] Date: 02/04/2023 Prepared by: Caralee Ates  Exercises - Supine Bridge  - 1 x daily - 7 x weekly - 3 sets  - 10 reps - Standing shoulder flexion wall slides  - 1 x daily - 7 x weekly - 3 sets - 10 reps  ASSESSMENT:  CLINICAL IMPRESSION:  Trey Paula arrives today with a bit more back pain today, we spent some time warming up LEs on Scifit and UEs on UBE (Nustep not available for full body warmup), then focused on lumbar and thoracic mobility/pain relief this session as able. Toe pain definitely affecting gait and balance, did have a couple LOBs when navigating small spaces between machines but able to recover. Will continue to monitor and incorporate balance as able/appropriate.   OBJECTIVE IMPAIRMENTS: decreased activity tolerance, decreased coordination, decreased endurance, decreased knowledge of condition, decreased mobility, difficulty walking, decreased strength, postural dysfunction, and pain.   ACTIVITY LIMITATIONS: carrying, lifting, standing, squatting, transfers, bathing, dressing, and reach over head  PARTICIPATION LIMITATIONS: meal prep, cleaning, laundry, shopping, community activity, and yard work  PERSONAL FACTORS: Behavior pattern, Education, Fitness, Past/current experiences, and 3+ comorbidities: DM, fibromyalgia, chronic neck and lower back pain,  are also affecting patient's functional outcome.   REHAB POTENTIAL: Fair    CLINICAL DECISION MAKING: Evolving/moderate complexity  EVALUATION COMPLEXITY: Moderate   GOALS: Goals reviewed with patient? Yes  SHORT TERM GOALS: Target date: 2 weeks 02/18/23  I HEP Baseline: initiated today Goal status: INITIAL   LONG TERM GOALS: Target date: 04/29/23  FOTO improve from 31 to 41 Baseline:  Goal status: INITIAL  2.  Improve strength B quads to 5/5 for improved transfers, activity tolerance Baseline: 4-/5 B Goal status: INITIAL  3.  30 sec sit to stand improve to 10 Baseline:  8 Goal status: INITIAL  4.  Improve 6 min walk test to over 800' Baseline: 3 min,11 sec and 360' Goal status: INITIAL  PLAN:  PT FREQUENCY:  2x/week  PT DURATION: 12 weeks  PLANNED INTERVENTIONS: Therapeutic exercises, Therapeutic activity, Neuromuscular re-education, Balance training, Gait training, Patient/Family education, Self Care, and Joint mobilization.  PLAN FOR NEXT SESSION: initiate other cardiovascular traning, gentle stretching to improve spinal extension flexibility, quads, strength to tolerance, balance. Needs HEP update for thoracic and lumbar stretches   Nedra Hai, PT, DPT 02/19/23 10:59 AM

## 2023-02-20 ENCOUNTER — Encounter: Payer: Self-pay | Admitting: Family Medicine

## 2023-02-20 DIAGNOSIS — L03032 Cellulitis of left toe: Secondary | ICD-10-CM

## 2023-02-23 ENCOUNTER — Ambulatory Visit: Payer: Medicaid Other | Admitting: Physical Therapy

## 2023-02-23 ENCOUNTER — Encounter: Payer: Self-pay | Admitting: Physical Therapy

## 2023-02-23 DIAGNOSIS — G8929 Other chronic pain: Secondary | ICD-10-CM

## 2023-02-23 DIAGNOSIS — M545 Low back pain, unspecified: Secondary | ICD-10-CM

## 2023-02-23 DIAGNOSIS — Z7409 Other reduced mobility: Secondary | ICD-10-CM

## 2023-02-23 DIAGNOSIS — R293 Abnormal posture: Secondary | ICD-10-CM

## 2023-02-23 NOTE — Therapy (Signed)
OUTPATIENT PHYSICAL THERAPY THORACOLUMBAR TREATMENT   Patient Name: Chase [name] MRN: [id number] DOB:Dec 04, 1960, 62 y.o., male Today's Date: 02/23/2023  END OF SESSION:  PT End of Session - 02/23/23 1344     Visit Number 6    Date for PT Re-Evaluation 04/29/23    Progress Note Due on Visit 10    PT Start Time 1345    PT Stop Time 1427    PT Time Calculation (min) 42 min    Activity Tolerance Patient tolerated treatment well    Behavior During Therapy [facility] for tasks assessed/performed                 Past Medical History:  Diagnosis Date   Abnormality of gait 11/10/2012   Acute GI bleeding    Acute renal insufficiency 02/10/2015   Acute upper respiratory infection 08/10/2012   Allergic drug reaction 05/24/2020   Allergic rhinitis 10/16/2020   Allergy    Anemia 02/10/2015   Annual physical exam 11/26/2011   ANXIETY DEPRESSION 12/09/2007   Qualifier: Diagnosis of  By: Lillia Mountain RN, Brien Few    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) 03/12/2018   Cerebrovascular accident, old 11/16/2014   Chest discomfort 06/08/2019   Chronic back pain 11/28/2020   Chronic cough 08/27/2014   CXR 08/2014:  Mild hyperinflation, no acute process Cleda Daub 09/2014:  Difficulty with procedure, but essentially normal +CVA's in past, with aspiration risk by history Speech evaluation 2016:      Chronic left shoulder pain 08/06/2020   Chronic pain syndrome 11/23/2019   Chronic pansinusitis 06/30/2018   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    Constipation    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   DDD (degenerative disc disease), cervical 04/26/2019   DDD (degenerative disc disease), lumbar 04/26/2019   Decreased hearing of both ears 02/12/2013   Deviated septum 05/31/2018   Diabetes mellitus without complication (HCC)    Difficulty urinating    DM (diabetes mellitus) type II uncontrolled, periph vascular disorder 01/02/2016   Dry skin 05/09/2019   Dysuria 11/29/2010   Essential (primary) hypertension 01/26/2015   Excessive daytime sleepiness 02/11/2018   FATIGUE 12/09/2007   Qualifier: Diagnosis of  By: Lillia Mountain RN, Brien Few    Fibromyalgia 03/02/2017   Food intolerance 05/09/2019   GERD 12/09/2007   Qualifier: Diagnosis of  By: Alphonzo Severance MD, Loni Dolly    GERD (gastroesophageal reflux disease)    GI bleed 02/10/2015   Headache(784.0)    Hearing loss    Hemangioma 02/12/2013   Hemorrhoid 05/22/2011   HTN (hypertension) 02/04/2010   Qualifier: Diagnosis of  By: Abner Greenspan MD, Stacey     Hyperlipidemia    Hyperlipidemia associated with type 2 diabetes mellitus (HCC) 11/22/2020   Hyperlipidemia LDL goal <70 01/26/2015   Hypersomnia, persistent 11/05/2012   epworth of 18 points, was placed on CPAP in 2008 with very low AHI ( no number  Mentioned) and struggles ever since with PAP. 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   Leg swelling    Localized swelling of both lower legs 03/02/2017   Low back pain 07/20/2007   Qualifier: Diagnosis of  By: Alphonzo Severance MD, Loni Dolly    Lower extremity edema 07/04/2020   Lower GI bleed    Melena 12/16/2021   Mixed conductive  and sensorineural hearing loss of right ear with restricted hearing of left ear 10/16/2020   Nasal congestion    Neck pain 03/15/2020   NEPHROLITHIASIS 08/13/2006   Qualifier: Diagnosis of  By: Bradly Bienenstock     Nocturia more than twice per night 02/11/2018   OSA on CPAP 10/17/2014   Otalgia of both ears 06/06/2021   Other chronic pain 03/02/2017   Other fatigue 07/04/2020   Pain in finger of right hand 10/06/2012   Pain in joint of right shoulder 02/21/2017   Palpitations 01/26/2015   Paresthesia 07/25/2010   Persistent atrial fibrillation (HCC) 08/29/2020   Persistent headaches 10/17/2014   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   Qualifier: Diagnosis of  By: Alphonzo Severance MD, Ivar Drape R    PONV (postoperative nausea and vomiting)    Preoperative clearance 07/04/2020   Prolapsed hemorrhoids 10/05/2013   Radiculopathy, lumbar region 03/15/2020   Rash 05/24/2019   Rash and other nonspecific skin eruption 07/23/2022   Raynaud's disease 03/02/2017   Raynaud's phenomenon 03/02/2017   Rectal bleeding 02/12/2015   Rectal pain    Rectal ulcer with bleeding after hemorrhoid banding 02/11/2015   Rhinitis, chronic 12/23/2006   Qualifier: Diagnosis of  By: Lillia Mountain RN, Regina G    Right shoulder pain 02/15/2015   Seasonal and perennial allergic rhinitis 05/09/2019   Severe episode of recurrent major depressive disorder, 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   Sinus infection 06/30/2014   Sinusitis, acute, maxillary 02/02/2014   Sleep apnea    Sleep apnea with use of continuous positive airway pressure (CPAP) 02/11/2018   SOB (shortness of breath) 07/04/2020   Stroke (HCC)    Stroke (HCC)    Subacute confusional state 10/17/2014   SYMPTOM, HYPERSOMNIA NOS 02/19/2007   Qualifier: Diagnosis of  By: Vernie Murders     Thrombosed hemorrhoids    Tick bite of back 11/23/2019   Tinnitus of both ears 08/29/2020   Trouble swallowing    Unspecified hemorrhoids 02/10/2015   Unspecified sleep apnea  11/05/2012   UTI (urinary tract infection) 05/19/2022   Varicose veins of lower extremities with other complications 08/10/2012   Vasomotor rhinitis 06/28/2021   Vertigo 03/19/2012   Past Surgical History:  Procedure Laterality Date   CATARACT EXTRACTION     x 3   EYE SURGERY  1968, 1985, 1987   cataracts   FLEXIBLE SIGMOIDOSCOPY N/A 02/11/2015   Procedure: FLEXIBLE SIGMOIDOSCOPY;  Surgeon: Iva Boop, MD;  Location: [facility];  Service: Endoscopy;  Laterality: N/A;   INNER EAR SURGERY     rt ear   INNER EAR SURGERY  1992   surgery - right arm     1983   Patient Active Problem List   Diagnosis Date Noted   Rash and other nonspecific skin eruption 07/23/2022   UTI (urinary tract infection) 05/19/2022  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   Increasing residual urine 03/10/2022   Melena 12/16/2021   Vasomotor rhinitis 06/28/2021   Otalgia of both ears 06/06/2021   Brain cyst 01/09/2021   Chronic back pain 11/28/2020   Hyperlipidemia associated with type 2 diabetes mellitus (HCC) 11/22/2020   Mixed conductive and sensorineural hearing loss of right ear with restricted hearing of left ear 10/16/2020   Allergic rhinitis 10/16/2020   Constipation    Hearing loss    Severe episode of recurrent major depressive disorder, 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   Hypokalemia 08/29/2020   BPH without urinary obstruction 08/29/2020   Tinnitus of both ears 08/29/2020   Persistent atrial fibrillation (HCC) 08/29/2020   Chronic left shoulder pain 08/06/2020   Allergy    Arthritis    Blood in stool    Diabetes mellitus without complication (HCC)    GERD (gastroesophageal reflux disease)    Hypertension    Leg swelling    Nasal congestion    PONV (postoperative nausea and vomiting)    Rectal pain    Thrombosed hemorrhoids    Trouble swallowing    Lower extremity edema 07/04/2020   Other fatigue 07/04/2020   Preoperative clearance 07/04/2020   Hypotension 07/04/2020   SOB  (shortness of breath) 07/04/2020   Allergic drug reaction 05/24/2020   Body mass index (BMI) 26.0-26.9, adult 03/15/2020   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   Radiculopathy, lumbar region 03/15/2020   Neck pain 03/15/2020   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/23/2019   Hyperlipidemia 11/23/2019   Bilateral hearing loss 11/23/2019   Tick bite of back 11/23/2019   Chronic pain syndrome 11/23/2019   Chest discomfort 06/08/2019   Benign neoplasm of brain (HCC) 05/24/2019   Rash 05/24/2019   Dry skin 05/09/2019   Seasonal and perennial allergic rhinitis 05/09/2019   Food intolerance 05/09/2019   DDD (degenerative disc disease), cervical 04/26/2019   DDD (degenerative disc disease), lumbar 04/26/2019   Chronic pansinusitis 06/30/2018   Deviated septum 05/31/2018   Insect bite 03/16/2018   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) 03/12/2018   Excessive daytime sleepiness 02/11/2018   Sleep apnea with use of continuous positive airway pressure (CPAP) 02/11/2018   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   Nocturia more than twice per night 02/11/2018   Bilateral leg and foot pain 03/02/2017   Fibromyalgia 03/02/2017   Localized swelling of both lower legs 03/02/2017   Other chronic pain 03/02/2017   Raynaud's disease 03/02/2017   Raynaud's phenomenon 03/02/2017   Pain in joint of right shoulder 02/21/2017   DM (diabetes mellitus) type II uncontrolled, periph vascular disorder 01/02/2016   Right shoulder pain 02/15/2015   Rectal bleeding 02/12/2015   Rectal ulcer with bleeding after hemorrhoid banding 02/11/2015   Lower GI bleed    Blood in stool, frank 02/10/2015   Acute renal insufficiency 02/10/2015   Unspecified hemorrhoids 02/10/2015   GI bleed 02/10/2015   Acute GI bleeding    Stroke (HCC) 01/26/2015   Palpitations 01/26/2015   Essential (primary) hypertension 01/26/2015   Hyperlipidemia LDL goal <70 01/26/2015    Cerebrovascular accident, old 11/16/2014   Internal hemorrhoids 10/22/2014   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 10/17/2014   OSA on CPAP 10/17/2014   Persistent headaches 10/17/2014   Subacute confusional state 10/17/2014   Chronic cough 08/27/2014   Sinus infection 06/30/2014   Sinusitis, acute, maxillary 02/02/2014   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  Prolapsed hemorrhoids 10/05/2013   Hemangioma 02/12/2013   Decreased hearing of both ears 02/12/2013   Abnormality of gait 11/10/2012   Hypersomnia, persistent 11/05/2012   Unspecified sleep apnea 11/05/2012   Pain in finger of right hand 10/06/2012   Internal and external hemorrhoids without complication 10/04/2012   Varicose veins of lower extremities with other complications 08/10/2012   Acute upper respiratory infection 08/10/2012   Bigeminy ? 03/22/2012   Vertigo 03/19/2012   Preventative health care 11/26/2011   Sleep apnea 11/03/2011   Insomnia 08/05/2011   Difficulty urinating    Hemorrhoid 05/22/2011   Dysuria 11/29/2010   Arthralgia 07/25/2010   Paresthesia 07/25/2010   HTN (hypertension) 02/04/2010   ANXIETY DEPRESSION 12/09/2007   GERD 12/09/2007   FATIGUE 12/09/2007   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   Low back pain 07/20/2007   SYMPTOM, HYPERSOMNIA NOS 02/19/2007   Rhinitis, chronic 12/23/2006   HEADACHE 12/23/2006   NEPHROLITHIASIS 08/13/2006    PCP: Seabron Spates, DO  REFERRING PROVIDER: Fanny Dance, MD  REFERRING DIAG: fibromyalgia, chronic pain lower back  Rationale for Evaluation and Treatment: Rehabilitation  THERAPY DIAG:  Chronic midline low back pain without sciatica  Abnormal posture  Impaired functional mobility and activity tolerance  ONSET DATE: chronic  SUBJECTIVE:                                                                                                                                                                                            SUBJECTIVE STATEMENT:  I was tired and sore, not sure if it was a productive sore or now. Toe is doing OK. Back is still an issue for me, trying to do what you say and improve my posture but I catch myself not doing this a lot too.    PERTINENT HISTORY:  Chronic lower back pain, referred to PT by pain medicine MD to address LBP  PAIN: NPRS 7/10 Are you having pain? Yes: Pain location: lower back/upper-mid back, L great toe  Pain description: aching, burning in back; toe is more of a different pain, more painful than pressure like a specific poking/burning/hurting  Aggravating factors: always hurts Relieving factors: heat  PRECAUTIONS: Fall and Other: brittle diabetic, aphasia   RED FLAGS: Bowel or bladder incontinence: No, Spinal tumors: Yes: dura Lumbar, Cauda equina syndrome: No, Compression fracture: No, and Abdominal aneurysm: No   WEIGHT BEARING RESTRICTIONS: No  FALLS:  Has patient fallen in last 6 months? Yes. Number of falls 2  LIVING ENVIRONMENT: Lives with: lives with their family and lives with an adult companion Lives in: House/apartment Stairs: No Has following equipment at home:  Single point cane  OCCUPATION: disabled  PLOF: Independent with basic ADLs and Needs assistance with homemaking  PATIENT GOALS: get rid of pain  NEXT MD VISIT: 4 weeks  OBJECTIVE:   DIAGNOSTIC FINDINGS: copied from referring clinican notes: chronic lower back pain and chronic neck pain. 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 -Cervical imaging with evidence of spinal stenosis and foraminal stenosis  PATIENT SURVEYS:  FOTO 31  SCREENING FOR RED FLAGS: Bowel or bladder incontinence: No Spinal tumors: Yes: dura ls region Cauda equina syndrome: no Compression fracture: No Abdominal aneurysm: No  COGNITION: Overall cognitive status: Within functional limits for tasks assessed     SENSATION: Pt reports hypersensitive LE  sensation with neuropathy  MUSCLE LENGTH: Hamstrings: Right wnl deg; Left -25 deg  POSTURE: forward head, decreased lumbar lordosis, increased thoracic kyphosis, flexed trunk , and wide base of support  PALPATION: Tender multiple pts paraspinals, shoulders  LUMBAR ROM:   AROM eval  Flexion 60%  Extension To neutral   Right lateral flexion 40%  Left lateral flexion 40%  Right rotation   Left rotation    (Blank rows = not tested)  LOWER EXTREMITY ROM:   B hip flexion to 95 degrees, otherwise wnl LOWER EXTREMITY MMT:    MMT Right eval Left eval  Hip flexion 4- 4-  Hip extension 4- 4-  Hip abduction 4+ 4+  Hip adduction    Hip internal rotation    Hip external rotation    Knee flexion    Knee extension 4- 4-  Ankle dorsiflexion    Ankle plantarflexion    Ankle inversion    Ankle eversion     (Blank rows = not tested)  LUMBAR SPECIAL TESTS:  Straight leg raise test:  positive on L, - R and FABER test: Positive B for LBP  FUNCTIONAL TESTS:  30 seconds chair stand test 7 reps, using B UE's 6 minute walk test: 360', 3 min 11 sec then fatigued  Gait:Comments: no device, wide base of support, pronounced locking L LE when became fatigued  TODAY'S TREATMENT:                                                                                                                              DATE:   02/23/23  TherEx  Nustep L5x8 minutes BLEs only >70spm Discussed and briefly practiced seated QL stretch B Lumbar rotation stretches 3x5 seconds B SKTC 3x5 seconds B  TA set 12x3 seconds holds supine Bridges x12 Supine TA + clamshell red TB x12 Seated scap retractions red TB + B ER x10    NMR  Tandem stance solid surface 2x30 seconds B  Narrow BOS on blue foam pad EO 3x30      02/19/23  TherEx  Scifit bike seat 13 L4 x5 minutes  UBE L4 x5 min backwards  Lumbar rotations 5x5 seconds B  SKTC 3x5 seconds B by PT due to  shoulder pain  Figure 4 stretch 2x30 seconds B   QL stretch forward 2x30 seconds  Thoracic flexion/extension and lateral flexion ROM x12 each      02/17/23  TherEx  Lumbar rotation stretch edge of mat table 3x10 seconds B Lumbar flexion stretch palms to floor 2x30 seconds STS no UEs x12 Standing with one foot on 8 inch step/other on blue foam pad 3x30 seconds B Forward step ups on 4 inch step step no UEs up to ModA for balance x10B Thoracic extensions against wall for posture x12 Standing on blue foam pad narrow BOS 3x30 seconds EO Examined L great toe due to reports of edema and redness/increased pain- does have increased edema and tenderness L great toe, no wounds or drainage noted but may have possible cellulitis (possible ingrown toenail?), recommended having MD check toe as soon as he is able. Able to move toe within available range given edema.  Also send PCP message regarding this in epic secure messenger  Nustep L5 x4 minutes BLEs only       02/12/23  TherEx  Lumbar rotation 5x3 seconds B SKTC with sheet behind knee- attempted but unable to tolerance due to shoulder pain Bridges x12  QL stretch in sitting 3x30 seconds forward  Lumbar rotation stretch seated 3x10 seconds B  STS with red TB around knees x10  Nustep L4 x6 minutes BLEs SPM >60   02/09/23: therapeutic exercise: Instructed the pt in the following ex to address gentle postural stretching and strengthening for thoracic spine, lumbar spine, quads, hip strength: Supine for bridging , 10 reps Supine B shoulder flexion, with 2 1/2 # cuff weight, cuff st utilized to achieve gentle stretch into thoracic extension and B shoulder flexion Lower trunk rotations, 10 x inst to start with small movements and increase amplitude gradually Alternating long arc quads 10x ea Leg press B 40# 2 sets 10 Seated for lat pulls, 20#, one set 10  02/04/23:  after evaluation, patient positioned in supine with moist heat along lower back, while performing bridges, then instructed  in wall slides on wall for thoracic spine extension stretch    PATIENT EDUCATION:  Education details: POC, goals Person educated: Patient Education method: Explanation, Demonstration, Tactile cues, Verbal cues, and Handouts Education comprehension: verbal cues required, tactile cues required, and needs further education  HOME EXERCISE PROGRAM:  Access Code: [email or web address] URL: [email or web address] Date: 02/23/2023 Prepared by: Nedra Hai  Exercises - Supine Bridge  - 1 x daily - 7 x weekly - 2 sets - 10 reps - Standing shoulder flexion wall slides  - 1 x daily - 7 x weekly - 2 sets - 10 reps - Seated Long Arc Quad  - 1 x daily - 7 x weekly - 2 sets - 10 reps - Supine Lower Trunk Rotation  - 1 x daily - 7 x weekly - 2 sets - 10 reps - 5 seconds  hold - Supine Single Knee to Chest Stretch  - 1 x daily - 7 x weekly - 2 sets - 10 reps - 5 second holds  hold - Seated Quadratus Lumborum Stretch in Chair  - 1 x daily - 7 x weekly - 2 sets - 3 reps - 30 seconds  hold  ASSESSMENT:  CLINICAL IMPRESSION:  Trey Paula arrives today doing OK, still having quite a bit of toe pain. We spent some time working on low back flexibility/mobility and core/LE strength, also worked on balance a bit as time allowed this afternoon. Still gets off  balance fairly easily. Encouraged him to keep monitoring his toe and return to MD for further w/u if it does not improve on current antibiotics.    OBJECTIVE IMPAIRMENTS: decreased activity tolerance, decreased coordination, decreased endurance, decreased knowledge of condition, decreased mobility, difficulty walking, decreased strength, postural dysfunction, and pain.   ACTIVITY LIMITATIONS: carrying, lifting, standing, squatting, transfers, bathing, dressing, and reach over head  PARTICIPATION LIMITATIONS: meal prep, cleaning, laundry, shopping, community activity, and yard work  PERSONAL FACTORS: Behavior pattern, Education, Fitness, Past/current  experiences, and 3+ comorbidities: DM, fibromyalgia, chronic neck and lower back pain,  are also affecting patient's functional outcome.   REHAB POTENTIAL: Fair    CLINICAL DECISION MAKING: Evolving/moderate complexity  EVALUATION COMPLEXITY: Moderate   GOALS: Goals reviewed with patient? Yes  SHORT TERM GOALS: Target date: 2 weeks 02/18/23  I HEP Baseline: initiated today Goal status: INITIAL   LONG TERM GOALS: Target date: 04/29/23  FOTO improve from 31 to 41 Baseline:  Goal status: INITIAL  2.  Improve strength B quads to 5/5 for improved transfers, activity tolerance Baseline: 4-/5 B Goal status: INITIAL  3.  30 sec sit to stand improve to 10 Baseline: 8 Goal status: INITIAL  4.  Improve 6 min walk test to over 800' Baseline: 3 min,11 sec and 360' Goal status: INITIAL  PLAN:  PT FREQUENCY: 2x/week  PT DURATION: 12 weeks  PLANNED INTERVENTIONS: Therapeutic exercises, Therapeutic activity, Neuromuscular re-education, Balance training, Gait training, Patient/Family education, Self Care, and Joint mobilization.  PLAN FOR NEXT SESSION: initiate other cardiovascular traning, gentle stretching to improve spinal extension flexibility, quads, strength to tolerance, balance.   Nedra Hai, PT, DPT 02/23/23 2:28 PM

## 2023-02-24 NOTE — Telephone Encounter (Signed)
Spoke with pt, pt states he is still having pain and still notices "discharge" when cleaning his toe or applying pressure. Pt states sometimes he believes its getting better and other times not so much. I asked pt what he had been using to clean his toe, pt stated he has been using peroxide, I advised pt it may be best to stop using the peroxide as it will prevent the wound from starting the healing process and scabbing over. Pt would like to know if he should continue his antibiotic or if he is needing a different one, pt asked for me to send him a Mychart message back with any further instructions.

## 2023-02-25 ENCOUNTER — Other Ambulatory Visit: Payer: Self-pay

## 2023-02-25 ENCOUNTER — Ambulatory Visit: Payer: Medicaid Other

## 2023-02-25 DIAGNOSIS — M545 Low back pain, unspecified: Secondary | ICD-10-CM | POA: Diagnosis not present

## 2023-02-25 DIAGNOSIS — R293 Abnormal posture: Secondary | ICD-10-CM

## 2023-02-25 DIAGNOSIS — Z7409 Other reduced mobility: Secondary | ICD-10-CM

## 2023-02-25 DIAGNOSIS — G8929 Other chronic pain: Secondary | ICD-10-CM

## 2023-02-25 NOTE — Therapy (Signed)
OUTPATIENT PHYSICAL THERAPY THORACOLUMBAR TREATMENT   Patient Name: Chase [name], 62 y.o., Chase [name] Today's Date: 02/25/2023  END OF SESSION:  PT End of Session - 02/25/23 1343     Visit Number 7    Date for PT Re-Evaluation 04/29/23    Progress Note Due on Visit 10    PT Start Time 1345    PT Stop Time 1429    PT Time Calculation (min) 44 min    Activity Tolerance Patient tolerated treatment well    Behavior During Therapy [facility] for tasks assessed/performed                  Past Medical History:  Diagnosis Date   Abnormality of gait 11/10/2012   Acute GI bleeding    Acute renal insufficiency 02/10/2015   Acute upper respiratory infection 08/10/2012   Allergic drug reaction 05/24/2020   Allergic rhinitis 10/16/2020   Allergy    Anemia 02/10/2015   Annual physical exam 11/26/2011   ANXIETY DEPRESSION 12/09/2007   Qualifier: Diagnosis of  By: Lillia Mountain RN, Brien Few    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) 03/12/2018   Cerebrovascular accident, old 11/16/2014   Chest discomfort 06/08/2019   Chronic back pain 11/28/2020   Chronic cough 08/27/2014   CXR 08/2014:  Mild hyperinflation, no acute process Cleda Daub 09/2014:  Difficulty with procedure, but essentially normal +CVA's in past, with aspiration risk by history Speech evaluation 2016:      Chronic left shoulder pain 08/06/2020   Chronic pain syndrome 11/23/2019   Chronic pansinusitis 06/30/2018   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    Constipation    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   DDD (degenerative disc disease), cervical 04/26/2019   DDD (degenerative disc disease), lumbar 04/26/2019   Decreased hearing of both ears 02/12/2013   Deviated septum 05/31/2018   Diabetes mellitus without complication (HCC)    Difficulty urinating    DM (diabetes mellitus) type II uncontrolled, periph vascular disorder 01/02/2016   Dry skin 05/09/2019   Dysuria 11/29/2010   Essential (primary) hypertension 01/26/2015   Excessive daytime sleepiness 02/11/2018   FATIGUE 12/09/2007   Qualifier: Diagnosis of  By: Lillia Mountain RN, Brien Few    Fibromyalgia 03/02/2017   Food intolerance 05/09/2019   GERD 12/09/2007   Qualifier: Diagnosis of  By: Alphonzo Severance MD, Loni Dolly    GERD (gastroesophageal reflux disease)    GI bleed 02/10/2015   Headache(784.0)    Hearing loss    Hemangioma 02/12/2013   Hemorrhoid 05/22/2011   HTN (hypertension) 02/04/2010   Qualifier: Diagnosis of  By: Abner Greenspan MD, Stacey     Hyperlipidemia    Hyperlipidemia associated with type 2 diabetes mellitus (HCC) 11/22/2020   Hyperlipidemia LDL goal <70 01/26/2015   Hypersomnia, persistent 11/05/2012   epworth of 18 points, was placed on CPAP in 2008 with very low AHI ( no number  Mentioned) and struggles ever since with PAP. 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   Leg swelling    Localized swelling of both lower legs 03/02/2017   Low back pain 07/20/2007   Qualifier: Diagnosis of  By: Alphonzo Severance MD, Loni Dolly    Lower extremity edema 07/04/2020   Lower GI bleed    Melena 12/16/2021   Mixed conductive  and sensorineural hearing loss of right ear with restricted hearing of left ear 10/16/2020   Nasal congestion    Neck pain 03/15/2020   NEPHROLITHIASIS 08/13/2006   Qualifier: Diagnosis of  By: Bradly Bienenstock     Nocturia more than twice per night 02/11/2018   OSA on CPAP 10/17/2014   Otalgia of both ears 06/06/2021   Other chronic pain 03/02/2017   Other fatigue 07/04/2020   Pain in finger of right hand 10/06/2012   Pain in joint of right shoulder 02/21/2017   Palpitations 01/26/2015   Paresthesia 07/25/2010   Persistent atrial fibrillation (HCC) 08/29/2020   Persistent headaches 10/17/2014   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   Qualifier: Diagnosis of  By: Alphonzo Severance MD, Ivar Drape R    PONV (postoperative nausea and vomiting)    Preoperative clearance 07/04/2020   Prolapsed hemorrhoids 10/05/2013   Radiculopathy, lumbar region 03/15/2020   Rash 05/24/2019   Rash and other nonspecific skin eruption 07/23/2022   Raynaud's disease 03/02/2017   Raynaud's phenomenon 03/02/2017   Rectal bleeding 02/12/2015   Rectal pain    Rectal ulcer with bleeding after hemorrhoid banding 02/11/2015   Rhinitis, chronic 12/23/2006   Qualifier: Diagnosis of  By: Lillia Mountain RN, Regina G    Right shoulder pain 02/15/2015   Seasonal and perennial allergic rhinitis 05/09/2019   Severe episode of recurrent major depressive disorder, 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   Sinus infection 06/30/2014   Sinusitis, acute, maxillary 02/02/2014   Sleep apnea    Sleep apnea with use of continuous positive airway pressure (CPAP) 02/11/2018   SOB (shortness of breath) 07/04/2020   Stroke (HCC)    Stroke (HCC)    Subacute confusional state 10/17/2014   SYMPTOM, HYPERSOMNIA NOS 02/19/2007   Qualifier: Diagnosis of  By: Vernie Murders     Thrombosed hemorrhoids    Tick bite of back 11/23/2019   Tinnitus of both ears 08/29/2020   Trouble swallowing    Unspecified hemorrhoids 02/10/2015   Unspecified sleep apnea  11/05/2012   UTI (urinary tract infection) 05/19/2022   Varicose veins of lower extremities with other complications 08/10/2012   Vasomotor rhinitis 06/28/2021   Vertigo 03/19/2012   Past Surgical History:  Procedure Laterality Date   CATARACT EXTRACTION     x 3   EYE SURGERY  1968, 1985, 1987   cataracts   FLEXIBLE SIGMOIDOSCOPY N/A 02/11/2015   Procedure: FLEXIBLE SIGMOIDOSCOPY;  Surgeon: Iva Boop, MD;  Location: [facility];  Service: Endoscopy;  Laterality: N/A;   INNER EAR SURGERY     rt ear   INNER EAR SURGERY  1992   surgery - right arm     1983   Patient Active Problem List   Diagnosis Date Noted   Rash and other nonspecific skin eruption 07/23/2022   UTI (urinary tract infection) 05/19/2022  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   Increasing residual urine 03/10/2022   Melena 12/16/2021   Vasomotor rhinitis 06/28/2021   Otalgia of both ears 06/06/2021   Brain cyst 01/09/2021   Chronic back pain 11/28/2020   Hyperlipidemia associated with type 2 diabetes mellitus (HCC) 11/22/2020   Mixed conductive and sensorineural hearing loss of right ear with restricted hearing of left ear 10/16/2020   Allergic rhinitis 10/16/2020   Constipation    Hearing loss    Severe episode of recurrent major depressive disorder, 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   Hypokalemia 08/29/2020   BPH without urinary obstruction 08/29/2020   Tinnitus of both ears 08/29/2020   Persistent atrial fibrillation (HCC) 08/29/2020   Chronic left shoulder pain 08/06/2020   Allergy    Arthritis    Blood in stool    Diabetes mellitus without complication (HCC)    GERD (gastroesophageal reflux disease)    Hypertension    Leg swelling    Nasal congestion    PONV (postoperative nausea and vomiting)    Rectal pain    Thrombosed hemorrhoids    Trouble swallowing    Lower extremity edema 07/04/2020   Other fatigue 07/04/2020   Preoperative clearance 07/04/2020   Hypotension 07/04/2020   SOB  (shortness of breath) 07/04/2020   Allergic drug reaction 05/24/2020   Body mass index (BMI) 26.0-26.9, adult 03/15/2020   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   Radiculopathy, lumbar region 03/15/2020   Neck pain 03/15/2020   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/23/2019   Hyperlipidemia 11/23/2019   Bilateral hearing loss 11/23/2019   Tick bite of back 11/23/2019   Chronic pain syndrome 11/23/2019   Chest discomfort 06/08/2019   Benign neoplasm of brain (HCC) 05/24/2019   Rash 05/24/2019   Dry skin 05/09/2019   Seasonal and perennial allergic rhinitis 05/09/2019   Food intolerance 05/09/2019   DDD (degenerative disc disease), cervical 04/26/2019   DDD (degenerative disc disease), lumbar 04/26/2019   Chronic pansinusitis 06/30/2018   Deviated septum 05/31/2018   Insect bite 03/16/2018   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) 03/12/2018   Excessive daytime sleepiness 02/11/2018   Sleep apnea with use of continuous positive airway pressure (CPAP) 02/11/2018   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   Nocturia more than twice per night 02/11/2018   Bilateral leg and foot pain 03/02/2017   Fibromyalgia 03/02/2017   Localized swelling of both lower legs 03/02/2017   Other chronic pain 03/02/2017   Raynaud's disease 03/02/2017   Raynaud's phenomenon 03/02/2017   Pain in joint of right shoulder 02/21/2017   DM (diabetes mellitus) type II uncontrolled, periph vascular disorder 01/02/2016   Right shoulder pain 02/15/2015   Rectal bleeding 02/12/2015   Rectal ulcer with bleeding after hemorrhoid banding 02/11/2015   Lower GI bleed    Blood in stool, frank 02/10/2015   Acute renal insufficiency 02/10/2015   Unspecified hemorrhoids 02/10/2015   GI bleed 02/10/2015   Acute GI bleeding    Stroke (HCC) 01/26/2015   Palpitations 01/26/2015   Essential (primary) hypertension 01/26/2015   Hyperlipidemia LDL goal <70 01/26/2015    Cerebrovascular accident, old 11/16/2014   Internal hemorrhoids 10/22/2014   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 10/17/2014   OSA on CPAP 10/17/2014   Persistent headaches 10/17/2014   Subacute confusional state 10/17/2014   Chronic cough 08/27/2014   Sinus infection 06/30/2014   Sinusitis, acute, maxillary 02/02/2014   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  Prolapsed hemorrhoids 10/05/2013   Hemangioma 02/12/2013   Decreased hearing of both ears 02/12/2013   Abnormality of gait 11/10/2012   Hypersomnia, persistent 11/05/2012   Unspecified sleep apnea 11/05/2012   Pain in finger of right hand 10/06/2012   Internal and external hemorrhoids without complication 10/04/2012   Varicose veins of lower extremities with other complications 08/10/2012   Acute upper respiratory infection 08/10/2012   Bigeminy ? 03/22/2012   Vertigo 03/19/2012   Preventative health care 11/26/2011   Sleep apnea 11/03/2011   Insomnia 08/05/2011   Difficulty urinating    Hemorrhoid 05/22/2011   Dysuria 11/29/2010   Arthralgia 07/25/2010   Paresthesia 07/25/2010   HTN (hypertension) 02/04/2010   ANXIETY DEPRESSION 12/09/2007   GERD 12/09/2007   FATIGUE 12/09/2007   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   Low back pain 07/20/2007   SYMPTOM, HYPERSOMNIA NOS 02/19/2007   Rhinitis, chronic 12/23/2006   HEADACHE 12/23/2006   NEPHROLITHIASIS 08/13/2006    PCP: Seabron Spates, DO  REFERRING PROVIDER: Fanny Dance, MD  REFERRING DIAG: fibromyalgia, chronic pain lower back  Rationale for Evaluation and Treatment: Rehabilitation  THERAPY DIAG:  Chronic midline low back pain without sciatica  Impaired functional mobility and activity tolerance  Abnormal posture  ONSET DATE: chronic  SUBJECTIVE:                                                                                                                                                                                            SUBJECTIVE STATEMENT:  I need to get my toe better, it is not getting better fast enough.  PERTINENT HISTORY:  Chronic lower back pain, referred to PT by pain medicine MD to address LBP  PAIN: NPRS 7/10 Are you having pain? Yes: Pain location: lower back/upper-mid back, L great toe  Pain description: aching, burning in back; toe is more of a different pain, more painful than pressure like a specific poking/burning/hurting  Aggravating factors: always hurts Relieving factors: heat  PRECAUTIONS: Fall and Other: brittle diabetic, aphasia   RED FLAGS: Bowel or bladder incontinence: No, Spinal tumors: Yes: dura Lumbar, Cauda equina syndrome: No, Compression fracture: No, and Abdominal aneurysm: No   WEIGHT BEARING RESTRICTIONS: No  FALLS:  Has patient fallen in last 6 months? Yes. Number of falls 2  LIVING ENVIRONMENT: Lives with: lives with their family and lives with an adult companion Lives in: House/apartment Stairs: No Has following equipment at home: Single point cane  OCCUPATION: disabled  PLOF: Independent with basic ADLs and Needs assistance with homemaking  PATIENT GOALS: get rid of pain  NEXT MD VISIT: 4 weeks  OBJECTIVE:  DIAGNOSTIC FINDINGS: copied from referring clinican notes: chronic lower back pain and chronic neck pain. 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 -Cervical imaging with evidence of spinal stenosis and foraminal stenosis  PATIENT SURVEYS:  FOTO 31  SCREENING FOR RED FLAGS: Bowel or bladder incontinence: No Spinal tumors: Yes: dura ls region Cauda equina syndrome: no Compression fracture: No Abdominal aneurysm: No  COGNITION: Overall cognitive status: Within functional limits for tasks assessed     SENSATION: Pt reports hypersensitive LE sensation with neuropathy  MUSCLE LENGTH: Hamstrings: Right wnl deg; Left -25 deg  POSTURE: forward head, decreased lumbar lordosis, increased  thoracic kyphosis, flexed trunk , and wide base of support  PALPATION: Tender multiple pts paraspinals, shoulders  LUMBAR ROM:   AROM eval  Flexion Chase%  Extension To neutral   Right lateral flexion 40%  Left lateral flexion 40%  Right rotation   Left rotation    (Blank rows = not tested)  LOWER EXTREMITY ROM:   B hip flexion to 95 degrees, otherwise wnl LOWER EXTREMITY MMT:    MMT Right eval Left eval  Hip flexion 4- 4-  Hip extension 4- 4-  Hip abduction 4+ 4+  Hip adduction    Hip internal rotation    Hip external rotation    Knee flexion    Knee extension 4- 4-  Ankle dorsiflexion    Ankle plantarflexion    Ankle inversion    Ankle eversion     (Blank rows = not tested)  LUMBAR SPECIAL TESTS:  Straight leg raise test:  positive on L, - R and FABER test: Positive B for LBP  FUNCTIONAL TESTS:  30 seconds chair stand test 7 reps, using B UE's 6 minute walk test: 360', 3 min 11 sec then fatigued  Gait:Comments: no device, wide base of support, pronounced locking L LE when became fatigued  TODAY'S TREATMENT:                                                                                                                              DATE:  02/25/23:  Therapeutic exercise: Nustep level 5, Ue's and LE's, 7 min Seated rows 20#, 2 x 15 reps Seated lat rows, 20 #, 15 x 2 sets Seated knee extension B LE's 5# 3 x 10 In ll bars, lunges on compliant side of BOSU, 15 x each leg, occasional use of each hand for balance In ll bars, tandem standing, lead foot on airex, horizontal head turns 15 reps each leg,  Standing feet on black bar, 2 sets 15 reps heel raises   02/23/23  TherEx  Nustep L5x8 minutes BLEs only >70spm Discussed and briefly practiced seated QL stretch B Lumbar rotation stretches 3x5 seconds B SKTC 3x5 seconds B  TA set 12x3 seconds holds supine Bridges x12 Supine TA + clamshell red TB x12 Seated scap retractions red TB + B ER x10    NMR  Tandem  stance solid surface 2x30 seconds B  Narrow BOS on blue foam pad EO 3x30      02/19/23  TherEx  Scifit bike seat 13 L4 x5 minutes  UBE L4 x5 min backwards  Lumbar rotations 5x5 seconds B  SKTC 3x5 seconds B by PT due to shoulder pain  Figure 4 stretch 2x30 seconds B  QL stretch forward 2x30 seconds  Thoracic flexion/extension and lateral flexion ROM x12 each      02/17/23  TherEx  Lumbar rotation stretch edge of mat table 3x10 seconds B Lumbar flexion stretch palms to floor 2x30 seconds STS no UEs x12 Standing with one foot on 8 inch step/other on blue foam pad 3x30 seconds B Forward step ups on 4 inch step step no UEs up to ModA for balance x10B Thoracic extensions against wall for posture x12 Standing on blue foam pad narrow BOS 3x30 seconds EO Examined L great toe due to reports of edema and redness/increased pain- does have increased edema and tenderness L great toe, no wounds or drainage noted but may have possible cellulitis (possible ingrown toenail?), recommended having MD check toe as soon as he is able. Able to move toe within available range given edema.  Also send PCP message regarding this in epic secure messenger  Nustep L5 x4 minutes BLEs only       02/12/23  TherEx  Lumbar rotation 5x3 seconds B SKTC with sheet behind knee- attempted but unable to tolerance due to shoulder pain Bridges x12  QL stretch in sitting 3x30 seconds forward  Lumbar rotation stretch seated 3x10 seconds B  STS with red TB around knees x10  Nustep L4 x6 minutes BLEs SPM >Chase   02/09/23: therapeutic exercise: Instructed the pt in the following ex to address gentle postural stretching and strengthening for thoracic spine, lumbar spine, quads, hip strength: Supine for bridging , 10 reps Supine B shoulder flexion, with 2 1/2 # cuff weight, cuff st utilized to achieve gentle stretch into thoracic extension and B shoulder flexion Lower trunk rotations, 10 x inst to start with  small movements and increase amplitude gradually Alternating long arc quads 10x ea Leg press B 40# 2 sets 10 Seated for lat pulls, 20#, one set 10  02/04/23:  after evaluation, patient positioned in supine with moist heat along lower back, while performing bridges, then instructed in wall slides on wall for thoracic spine extension stretch    PATIENT EDUCATION:  Education details: POC, goals Person educated: Patient Education method: Explanation, Demonstration, Tactile cues, Verbal cues, and Handouts Education comprehension: verbal cues required, tactile cues required, and needs further education  HOME EXERCISE PROGRAM:  Access Code: [email or web address] URL: [email or web address] Date: 02/23/2023 Prepared by: Nedra Hai  Exercises - Supine Bridge  - 1 x daily - 7 x weekly - 2 sets - 10 reps - Standing shoulder flexion wall slides  - 1 x daily - 7 x weekly - 2 sets - 10 reps - Seated Long Arc Quad  - 1 x daily - 7 x weekly - 2 sets - 10 reps - Supine Lower Trunk Rotation  - 1 x daily - 7 x weekly - 2 sets - 10 reps - 5 seconds  hold - Supine Single Knee to Chest Stretch  - 1 x daily - 7 x weekly - 2 sets - 10 reps - 5 second holds  hold - Seated Quadratus Lumborum Stretch in Chair  - 1 x daily - 7 x weekly - 2 sets - 3  reps - 30 seconds  hold  ASSESSMENT:  CLINICAL IMPRESSION:  Mr Sutherland is attending skilled physical therapy due to chronic pain in multiple sites as well as fibromyalgia.  We discussed today again trying to find a balance with cardiovascular fitness and  strengthening activities, to improve his ease with functional movements, and hopefully thereby assist him with long term pain management.  Also further addressed his balance.  He is limited somewhat with standing and gait activities due to gr toe pain. Encouraged him to keep monitoring his toe and return to MD for further w/u if it does not improve on current antibiotics. Continues to be a good candidate for skilled  PT to address his pain management and diffuse weakness.    OBJECTIVE IMPAIRMENTS: decreased activity tolerance, decreased coordination, decreased endurance, decreased knowledge of condition, decreased mobility, difficulty walking, decreased strength, postural dysfunction, and pain.   ACTIVITY LIMITATIONS: carrying, lifting, standing, squatting, transfers, bathing, dressing, and reach over head  PARTICIPATION LIMITATIONS: meal prep, cleaning, laundry, shopping, community activity, and yard work  PERSONAL FACTORS: Behavior pattern, Education, Fitness, Past/current experiences, and 3+ comorbidities: DM, fibromyalgia, chronic neck and lower back pain,  are also affecting patient's functional outcome.   REHAB POTENTIAL: Fair    CLINICAL DECISION MAKING: Evolving/moderate complexity  EVALUATION COMPLEXITY: Moderate   GOALS: Goals reviewed with patient? Yes  SHORT TERM GOALS: Target date: 2 weeks 02/18/23  I HEP Baseline: initiated today Goal status: INITIAL   LONG TERM GOALS: Target date: 04/29/23  FOTO improve from 31 to 41 Baseline:  Goal status: INITIAL  2.  Improve strength B quads to 5/5 for improved transfers, activity tolerance Baseline: 4-/5 B Goal status: INITIAL  3.  30 sec sit to stand improve to 10 Baseline: 8 Goal status: INITIAL  4.  Improve 6 min walk test to over 800' Baseline: 3 min,11 sec and 360' Goal status: INITIAL  PLAN:  PT FREQUENCY: 2x/week  PT DURATION: 12 weeks  PLANNED INTERVENTIONS: Therapeutic exercises, Therapeutic activity, Neuromuscular re-education, Balance training, Gait training, Patient/Family education, Self Care, and Joint mobilization.  PLAN FOR NEXT SESSION: initiate other cardiovascular traning, gentle stretching to improve spinal extension flexibility, quads, strength to tolerance, balance.   Armin Yerger PT, DPT, OCS 02/25/23 4:07 PM

## 2023-02-26 ENCOUNTER — Encounter: Payer: Self-pay | Admitting: Physical Medicine & Rehabilitation

## 2023-02-26 ENCOUNTER — Encounter: Payer: Medicaid Other | Attending: Physical Medicine & Rehabilitation | Admitting: Physical Medicine & Rehabilitation

## 2023-02-26 VITALS — BP 123/82 | HR 83 | Ht 69.0 in | Wt 178.8 lb

## 2023-02-26 DIAGNOSIS — E1142 Type 2 diabetes mellitus with diabetic polyneuropathy: Secondary | ICD-10-CM

## 2023-02-26 DIAGNOSIS — M797 Fibromyalgia: Secondary | ICD-10-CM | POA: Diagnosis not present

## 2023-02-26 DIAGNOSIS — Z79891 Long term (current) use of opiate analgesic: Secondary | ICD-10-CM | POA: Diagnosis not present

## 2023-02-26 DIAGNOSIS — Z794 Long term (current) use of insulin: Secondary | ICD-10-CM | POA: Diagnosis not present

## 2023-02-26 DIAGNOSIS — G894 Chronic pain syndrome: Secondary | ICD-10-CM

## 2023-02-26 DIAGNOSIS — Z5181 Encounter for therapeutic drug level monitoring: Secondary | ICD-10-CM

## 2023-02-26 DIAGNOSIS — F341 Dysthymic disorder: Secondary | ICD-10-CM

## 2023-02-26 MED ORDER — PREGABALIN 25 MG PO CAPS: 25.0000 mg | ORAL_CAPSULE | Freq: Two times a day (BID) | ORAL | 2 refills | Status: DC

## 2023-02-26 NOTE — Patient Instructions (Signed)
Stop gabapentin, start lyrica today Will do urine drug screen pain agreement today Call in 10 days if pain not improved Continue PT

## 2023-02-26 NOTE — Progress Notes (Signed)
Subjective:    Patient ID: Chase [name], male    DOB: [birth year], 62 y.o.   MRN: [id number]  HPI HPI   Chase [name] is a 62 y.o. 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) (03/12/2018), Cerebrovascular accident, old (11/16/2014), Chest discomfort (06/08/2019), Chronic back pain (11/28/2020), Chronic cough (08/27/2014), Chronic left shoulder pain (08/06/2020), Chronic pain syndrome (11/23/2019), Chronic pansinusitis (06/30/2018), Chronic prostatitis (12/06/2013), 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), Constipation, 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), DDD (degenerative disc disease), cervical (04/26/2019), DDD (degenerative disc disease), lumbar (04/26/2019), Decreased hearing of both ears (02/12/2013), Deviated septum (05/31/2018), Diabetes mellitus without complication (HCC), Difficulty urinating, DM (diabetes mellitus) type II uncontrolled, periph vascular disorder (01/02/2016), Dry skin (05/09/2019), Dysuria (11/29/2010), Essential (primary) hypertension (01/26/2015), Excessive daytime sleepiness (02/11/2018), FATIGUE (12/09/2007), Fibromyalgia (03/02/2017), Food intolerance (05/09/2019), GERD (12/09/2007), GERD (gastroesophageal  reflux disease), GI bleed (02/10/2015), Headache(784.0), Hearing loss, 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/12/2013), Hemorrhoid (05/22/2011), HTN (hypertension) (02/04/2010), Hyperlipidemia, Hyperlipidemia associated with type 2 diabetes mellitus (HCC) (11/22/2020), Hyperlipidemia LDL goal <70 (01/26/2015), Hypersomnia, persistent (11/05/2012), Hypertension, Hypokalemia (08/29/2020), Hypotension (07/04/2020), 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), 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), Increasing residual urine (03/10/2022), Insect bite (03/16/2018), Insomnia (08/05/2011), Internal and external hemorrhoids without complication (10/04/2012), Internal hemorrhoids (10/22/2014), 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), Leg swelling, Localized swelling of both lower legs (03/02/2017), Low back pain (07/20/2007), Lower extremity edema (07/04/2020), Lower GI bleed, Melena (12/16/2021), Mixed conductive and sensorineural hearing loss of right ear with restricted hearing of left ear (10/16/2020), Nasal congestion, Neck pain (03/15/2020), NEPHROLITHIASIS (08/13/2006), Nocturia more than twice per night (02/11/2018), OSA on CPAP (10/17/2014), Otalgia of both ears (06/06/2021), Other chronic pain (03/02/2017), Other fatigue (07/04/2020), Pain in finger of right hand (10/06/2012), Pain in joint of right shoulder (02/21/2017), Palpitations (01/26/2015), Paresthesia (07/25/2010), Persistent atrial fibrillation (HCC) (08/29/2020), Persistent headaches (10/17/2014), 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), PONV (postoperative nausea and vomiting), Preoperative clearance (07/04/2020), Prolapsed hemorrhoids (10/05/2013), Radiculopathy, lumbar region (03/15/2020), Rash (05/24/2019), Rash and other nonspecific skin eruption (07/23/2022), Raynaud's disease (03/02/2017), Raynaud's phenomenon (03/02/2017), Rectal bleeding (02/12/2015), Rectal pain, Rectal ulcer with bleeding after hemorrhoid banding (02/11/2015), Rhinitis, chronic  (12/23/2006), Right shoulder pain (02/15/2015), Seasonal and perennial allergic rhinitis (05/09/2019), Severe episode of recurrent major depressive disorder, without psychotic features (HCC) (08/29/2020), Sinus infection (06/30/2014), Sinusitis, acute, maxillary (02/02/2014), Sleep apnea, Sleep apnea with use of continuous positive airway pressure (CPAP) (02/11/2018), SOB (shortness of breath) (07/04/2020), Stroke (HCC), Stroke (HCC), Subacute confusional state (10/17/2014), SYMPTOM, HYPERSOMNIA NOS (02/19/2007), Thrombosed hemorrhoids, Tick bite of back (11/23/2019), Tinnitus of both ears (08/29/2020), Trouble swallowing, Unspecified hemorrhoids (02/10/2015), Unspecified sleep apnea (11/05/2012), UTI (urinary tract infection) (05/19/2022), Varicose veins of lower extremities with other complications (08/10/2012), Vasomotor rhinitis (06/28/2021), and Vertigo (03/19/2012).   They are presenting to PM&R clinic as a new patient for pain management evaluation. They were referred by Dr. Zola Button for treatment of chronic pain.    Chase [name] reports he has had chronic pain  for about 10 years.  His speech is limited due to history of CVAs with aphasia.  Patient has neck pain with pain affecting his bilateral arms.  He also has lower back pain and pain in his legs.  He also has burning, pins-and-needles and tingling pain in his feet, suspected to be due to diabetes mellitus.  Walking and standing worsens his pain.  Pending backwards is also painful.  Patient indicates he has pain throughout his entire body.  He reports he was previously followed by Dr. Jordan Likes for his pain.  Patient reports he had injections in his back or neck and is unsure if this is beneficial.  He indicates he was later followed by other doctors that were prescribing him medications.  He has trouble swallowing pills so is taking liquid hydrocodone.  He says this medication is helping keep his pain under control.  He also takes gabapentin 100 mg 3 times  daily which she feels like is beneficial to the pain in his feet however he does not keep it controlled.   Chart review indicates he was previously diagnosed with fibromyalgia.   He reports history of Raynaud's phenomena   Patient is a poor historian and limitations due to aphasia         Red flag symptoms: No red flags for back pain endorsed in Hx or ROS   Medications tried:   Nsaids - limited by comorbidities  Tylenol - helps Opiates  hydrocodone  Gabapentin- TID 100mg ,helping  TCAs  -denies, doesn't recall  SNRIs  - effexor helps with mood      Other treatments: PT- helped in the past  Chiropractor TENs unit - Injections - back or neck injections not sure if this helped  Surgery- denies    Goals for pain control: Decrease overall body wide pain    Interval History 02/26/23 Chase [name] is here for follow-up regarding his back and neck pain, pain related to peripheral neuropathy, fibromyalgia.  He reports that pain is largely unchanged from last visit.  He has not consistently tried taking gabapentin 200 mg 3 times daily and has mostly been taking it at the 100 mg dose.  He has been having more pain in his left great toe, being treated with antibiotics by his PCP.  He has been walking with physical therapy and feels like this has been helping his balance and strength.  Physical therapy has also had a little benefit to his pain.  He reports he forgot to get records sent over from his prior pain clinic.  He says he stopped going there because they started doing more procedure treatments.  He says they referred him to a different provider but he did not follow-up on this.  He reports his pain was better controlled when he was taking the hydrocodone liquid.  He was able to walk a lot farther and do a lot more at home.   Pain Inventory Average Pain 6 Pain Right Now 8 My pain is sharp, burning, dull, tingling, and aching  In the last 24 hours, has pain interfered with the  following? General activity 7 Relation with others 6 Enjoyment of life 5 What TIME of day is your pain at its worst? morning , daytime, evening, and night Sleep (in general) Poor  Pain is worse with: walking, bending, sitting, standing, and some activites Pain improves with: rest, heat/ice, and medication Relief from Meds: 4  Family History  Problem Relation Age of Onset   Breast cancer Mother 35  breast   Huntington's disease Mother    Stroke Father    Heart disease Father    Hypertension Father    Heart attack Father    Hypertension Brother    Hyperlipidemia Neg Hx    Diabetes Neg Hx    Social History   Socioeconomic History   Marital status: Single    Spouse name: Not on file   Number of children: 0   Years of education: Not on file   Highest education level: 12th grade  Occupational History   Not on file  Tobacco Use   Smoking status: Never   Smokeless tobacco: Never  Vaping Use   Vaping status: Never Used  Substance and Sexual Activity   Alcohol use: No    Alcohol/week: 0.0 standard drinks of alcohol    Comment: none   Drug use: No   Sexual activity: Never  Other Topics Concern   Not on file  Social History Narrative   No caffeine intake except for chocolate.  Exercised-less walking    Social Determinants of Health   Financial Resource Strain: Medium Risk (02/17/2023)   Overall Financial Resource Strain (CARDIA)    Difficulty of Paying Living Expenses: Somewhat hard  Food Insecurity: Food Insecurity Present (02/17/2023)   Hunger Vital Sign    Worried About Running Out of Food in the Last Year: Sometimes true    Ran Out of Food in the Last Year: Patient declined  Transportation Needs: Patient Declined (02/17/2023)   PRAPARE - Administrator, Civil Service (Medical): Patient declined    Lack of Transportation (Non-Medical): Patient declined  Physical Activity: Unknown (02/17/2023)   Exercise Vital Sign    Days of Exercise per Week: Patient  declined    Minutes of Exercise per Session: Not on file  Stress: Stress Concern Present (02/17/2023)   [employer] of Occupational Health - Occupational Stress Questionnaire    Feeling of Stress : To some extent  Social Connections: Unknown (02/17/2023)   Social Connection and Isolation Panel [NHANES]    Frequency of Communication with Friends and Family: Patient declined    Frequency of Social Gatherings with Friends and Family: Patient declined    Attends Religious Services: Patient declined    Database administrator or Organizations: Patient declined    Attends Engineer, structural: Not on file    Marital Status: Patient declined   Past Surgical History:  Procedure Laterality Date   CATARACT EXTRACTION     x 3   EYE SURGERY  1968, 1985, 1987   cataracts   FLEXIBLE SIGMOIDOSCOPY N/A 02/11/2015   Procedure: FLEXIBLE SIGMOIDOSCOPY;  Surgeon: Iva Boop, MD;  Location: [facility];  Service: Endoscopy;  Laterality: N/A;   INNER EAR SURGERY     rt ear   INNER EAR SURGERY  1992   surgery - right arm     1983   Past Surgical History:  Procedure Laterality Date   CATARACT EXTRACTION     x 3   EYE SURGERY  1968, 1985, 1987   cataracts   FLEXIBLE SIGMOIDOSCOPY N/A 02/11/2015   Procedure: FLEXIBLE SIGMOIDOSCOPY;  Surgeon: Iva Boop, MD;  Location: [facility];  Service: Endoscopy;  Laterality: N/A;   INNER EAR SURGERY     rt ear   INNER EAR SURGERY  1992   surgery - right arm     1983   Past Medical History:  Diagnosis Date   Abnormality of gait 11/10/2012   Acute  GI bleeding    Acute renal insufficiency 02/10/2015   Acute upper respiratory infection 08/10/2012   Allergic drug reaction 05/24/2020   Allergic rhinitis 10/16/2020   Allergy    Anemia 02/10/2015   Annual physical exam 11/26/2011   ANXIETY DEPRESSION 12/09/2007   Qualifier: Diagnosis of  By: Lillia Mountain RN, Brien Few    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) 03/12/2018   Cerebrovascular accident, old 11/16/2014   Chest discomfort 06/08/2019   Chronic back pain 11/28/2020   Chronic cough 08/27/2014   CXR 08/2014:  Mild hyperinflation, no acute process Cleda Daub 09/2014:  Difficulty with procedure, but essentially normal +CVA's in past, with aspiration risk by history Speech evaluation 2016:      Chronic left shoulder pain 08/06/2020   Chronic pain syndrome 11/23/2019   Chronic pansinusitis 06/30/2018   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   Constipation    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   DDD (degenerative disc disease), cervical 04/26/2019   DDD (degenerative disc disease), lumbar 04/26/2019   Decreased hearing of both ears 02/12/2013   Deviated septum 05/31/2018   Diabetes mellitus without complication (HCC)    Difficulty urinating    DM (diabetes mellitus) type II uncontrolled, periph vascular disorder 01/02/2016   Dry skin 05/09/2019   Dysuria 11/29/2010   Essential (primary) hypertension 01/26/2015   Excessive daytime sleepiness 02/11/2018   FATIGUE 12/09/2007   Qualifier: Diagnosis of  By: Lillia Mountain RN, Brien Few    Fibromyalgia 03/02/2017   Food intolerance 05/09/2019   GERD 12/09/2007   Qualifier: Diagnosis of  By: Alphonzo Severance MD, Loni Dolly    GERD (gastroesophageal reflux disease)    GI bleed 02/10/2015   Headache(784.0)    Hearing loss    Hemangioma 02/12/2013   Hemorrhoid 05/22/2011   HTN (hypertension) 02/04/2010   Qualifier: Diagnosis of  By: Abner Greenspan MD, Stacey     Hyperlipidemia    Hyperlipidemia associated  with type 2 diabetes mellitus (HCC) 11/22/2020   Hyperlipidemia LDL goal <70 01/26/2015   Hypersomnia, persistent 11/05/2012   epworth of 18 points, was placed on CPAP in 2008 with very low AHI ( no number  Mentioned) and struggles ever since with PAP. 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   Leg swelling    Localized swelling of both lower legs 03/02/2017   Low back pain 07/20/2007   Qualifier: Diagnosis of  By: Alphonzo Severance MD, Loni Dolly    Lower extremity edema 07/04/2020   Lower GI bleed    Melena 12/16/2021   Mixed conductive and sensorineural hearing loss of right ear with restricted hearing of left ear 10/16/2020   Nasal congestion  Neck pain 03/15/2020   NEPHROLITHIASIS 08/13/2006   Qualifier: Diagnosis of  By: Bradly Bienenstock     Nocturia more than twice per night 02/11/2018   OSA on CPAP 10/17/2014   Otalgia of both ears 06/06/2021   Other chronic pain 03/02/2017   Other fatigue 07/04/2020   Pain in finger of right hand 10/06/2012   Pain in joint of right shoulder 02/21/2017   Palpitations 01/26/2015   Paresthesia 07/25/2010   Persistent atrial fibrillation (HCC) 08/29/2020   Persistent headaches 10/17/2014   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   Qualifier: Diagnosis of  By: Alphonzo Severance MD, Ivar Drape R    PONV (postoperative nausea and vomiting)    Preoperative clearance 07/04/2020   Prolapsed hemorrhoids 10/05/2013   Radiculopathy, lumbar region 03/15/2020   Rash 05/24/2019   Rash and other nonspecific skin eruption 07/23/2022   Raynaud's disease 03/02/2017   Raynaud's phenomenon 03/02/2017   Rectal  bleeding 02/12/2015   Rectal pain    Rectal ulcer with bleeding after hemorrhoid banding 02/11/2015   Rhinitis, chronic 12/23/2006   Qualifier: Diagnosis of  By: Lillia Mountain RN, Regina G    Right shoulder pain 02/15/2015   Seasonal and perennial allergic rhinitis 05/09/2019   Severe episode of recurrent major depressive disorder, 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   Sinus infection 06/30/2014   Sinusitis, acute, maxillary 02/02/2014   Sleep apnea    Sleep apnea with use of continuous positive airway pressure (CPAP) 02/11/2018   SOB (shortness of breath) 07/04/2020   Stroke (HCC)    Stroke (HCC)    Subacute confusional state 10/17/2014   SYMPTOM, HYPERSOMNIA NOS 02/19/2007   Qualifier: Diagnosis of  By: Vernie Murders     Thrombosed hemorrhoids    Tick bite of back 11/23/2019   Tinnitus of both ears 08/29/2020   Trouble swallowing    Unspecified hemorrhoids 02/10/2015   Unspecified sleep apnea 11/05/2012   UTI (urinary tract infection) 05/19/2022   Varicose veins of lower extremities with other complications 08/10/2012   Vasomotor rhinitis 06/28/2021   Vertigo 03/19/2012   BP 123/82   Pulse 83   Ht 5\' 9"  (1.753 m)   Wt 178 lb 12.8 oz (81.1 kg)   SpO2 98%   BMI 26.40 kg/m   Opioid Risk Score:   Fall Risk Score:  `1  Depression screen [facility] 2/9     01/15/2023    3:06 PM 12/22/2022    2:24 PM 06/20/2022    3:08 PM 12/16/2021   10:41 AM 07/03/2020   12:04 PM 10/21/2018   10:23 AM 08/04/2017   10:51 AM  Depression screen PHQ 2/9  Decreased Interest 2 2 0 0 0 0 --  Down, Depressed, Hopeless 2 3 0 0 0 1 2  PHQ - 2 Score 4 5 0 0 0 1 2  Altered sleeping 3 2 0 0  3 1  Tired, decreased energy 2 2 0 0  1 2  Change in appetite 2 1 0 0  0 2  Feeling bad or failure about yourself  2 0 0 0  0 3  Trouble concentrating 2 1 0 0  1 3  Moving slowly or fidgety/restless 3 1 0 0  1 2  Suicidal thoughts 0 0 0 0  0 0  PHQ-9 Score 18 12 0 0  7 15  Difficult doing work/chores Somewhat  difficult Very difficult Not difficult at all Not difficult at all  Somewhat difficult      Review of Systems  Musculoskeletal:  Positive for back pain.       Head pain and B/L shoulder pain   All other systems reviewed and are negative.     Objective:   Physical Exam   Gen: no distress, normal appearing HEENT: oral mucosa pink and moist, NCAT Cardio: Reg rate Chest: normal effort, normal rate of breathing Abd: soft, non-distended Ext: no edema Psych: pleasant, normal affect Skin: intact Left great toe with some mild erythema around the toenail, no drainage noted.  No significant warmth.  Patient reports this is improved from prior and he has been taking his antibiotics. Neuro: Alert and awake, follows commands, cranial nerves II through XII grossly intact Patient has significant aphasia appears to be more expressive than receptive Sensory exam normal for light touch and pain in all 4 limbs. No limb ataxia or cerebellar signs.    Musculoskeletal:  Spurling's test negative Kyphotic posture Pain with spinal extension and facet loading Patient with diffuse tenderness to palpation throughout bilateral upper and lower extremities-unchanged Paraspinal tenderness and cervical spine and lumbar spine SLR negative however resulted in lower back pain  Lumbar spine MRI 06/05/2021 IMPRESSION: 1. No significant lumbar spine disc protrusion, foraminal stenosis or central canal stenosis. 2. 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 Differential considerations include a nerve sheath tumor versus myxopapillary ependymoma. Metastatic disease is considered less likely given the lack of significant interval change.     Cervical spine MRI 01/09/2020 IMPRESSION: 1. C4-5 mild degenerative spinal stenosis which has improved from 2013 due to interval regression of a central herniation. 2. C5-6 advanced  right foraminal stenosis. Mild to moderate foraminal narrowing bilaterally at C4-5 and on the left at C5-6.      Assessment & Plan:   1) Fibromyalgia -Overall his exam appears to be most consistent with fibromyalgia, has very diffuse tenderness and pain 2)  Peripheral polyneuropathy likely due to diabetes mellitus type 3) chronic lower back pain and chronic neck pain. 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  Denies SI or HI 5) History of CVA.  Patient continues to have aphasia   Plan 1) Dc Gabapentin, start Lyrica 25mg  BID 2) Previously on liquid hydrocodone, ORT low. 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  Pt reports he forgot. I called prior clinic and requested for records to be faxed over.  5) Continue PT 6) continue venlafaxine XR 7) continue Flexeril as needed 8) consider Qutenza if polyneuropathy pain does not improve with oral medications 9) consider repeat L spine MRI

## 2023-02-27 ENCOUNTER — Telehealth: Payer: Self-pay | Admitting: Podiatry

## 2023-02-27 DIAGNOSIS — L03032 Cellulitis of left toe: Secondary | ICD-10-CM

## 2023-02-27 MED ORDER — DOXYCYCLINE HYCLATE 100 MG PO TABS: 100.0000 mg | ORAL_TABLET | Freq: Two times a day (BID) | ORAL | 0 refills | Status: DC

## 2023-02-27 NOTE — Telephone Encounter (Signed)
Patient has appointment with Dr. Logan Bores on Wednesday 9/18 for a sore toe.  This is the same toe he had an ingrown nail procedure on a few years ago by Dr. Logan Bores.  States it has been fine since the procedure until now.  He currently has swelling and redness and pain around the nail, as well as drainage.    Advised patient to perform Epsom salt soaks twice daily for 15 minutes in warm water, dry the toe well, and then apply a thin layer of triple antibiotic ointment to the nail area.  Cover the toe with a Bandaid when wearing closed toe shoes.  Leave it uncovered if at home with the shoes off.  He does wear compression stockings, and was given the okay to remove them at home if he can keep the leg elevated to help with swelling.    PCP placed him on doxycycline BID, but it will run out before his appointment.  I sent in a renewal of the doxycycline to his pharmacy on file.

## 2023-03-02 ENCOUNTER — Ambulatory Visit: Payer: Medicaid Other

## 2023-03-03 ENCOUNTER — Other Ambulatory Visit: Payer: Self-pay | Admitting: Family Medicine

## 2023-03-04 ENCOUNTER — Ambulatory Visit (INDEPENDENT_AMBULATORY_CARE_PROVIDER_SITE_OTHER): Payer: Medicaid Other | Admitting: Podiatry

## 2023-03-04 ENCOUNTER — Encounter: Payer: Self-pay | Admitting: Podiatry

## 2023-03-04 ENCOUNTER — Ambulatory Visit: Payer: Medicaid Other

## 2023-03-04 DIAGNOSIS — L03032 Cellulitis of left toe: Secondary | ICD-10-CM

## 2023-03-04 MED ORDER — HYDROCODONE-ACETAMINOPHEN 7.5-325 MG/15ML PO SOLN
15.0000 mL | ORAL | 0 refills | Status: DC | PRN
Start: 2023-03-04 — End: 2023-03-26

## 2023-03-05 ENCOUNTER — Other Ambulatory Visit: Payer: Self-pay | Admitting: Internal Medicine

## 2023-03-05 ENCOUNTER — Other Ambulatory Visit: Payer: Self-pay | Admitting: Family Medicine

## 2023-03-05 DIAGNOSIS — E785 Hyperlipidemia, unspecified: Secondary | ICD-10-CM

## 2023-03-09 ENCOUNTER — Encounter: Payer: Self-pay | Admitting: Podiatry

## 2023-03-15 NOTE — Progress Notes (Signed)
No chief complaint on file.   HPI: 62 y.o. male presenting today for evaluation of some redness with swelling to the left great toe.  Patient has noticed some sensitivity to the left great toenail plate now for a while.  Denies any history of injury.  Presents for further treatment evaluation  Past Medical History:  Diagnosis Date   Abnormality of gait 11/10/2012   Acute GI bleeding    Acute renal insufficiency 02/10/2015   Acute upper respiratory infection 08/10/2012   Allergic drug reaction 05/24/2020   Allergic rhinitis 10/16/2020   Allergy    Anemia 02/10/2015   Annual physical exam 11/26/2011   ANXIETY DEPRESSION 12/09/2007   Qualifier: Diagnosis of  By: Lillia Mountain RN, Brien Few    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) 03/12/2018   Cerebrovascular accident, old 11/16/2014   Chest discomfort 06/08/2019   Chronic back pain 11/28/2020   Chronic cough 08/27/2014   CXR 08/2014:  Mild hyperinflation, no acute process Cleda Daub 09/2014:  Difficulty with procedure, but essentially normal +CVA's in past, with aspiration risk by history Speech evaluation 2016:      Chronic left shoulder pain 08/06/2020   Chronic pain syndrome 11/23/2019   Chronic pansinusitis 06/30/2018   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   Constipation    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   DDD (degenerative disc disease), cervical 04/26/2019   DDD (degenerative disc disease), lumbar 04/26/2019   Decreased hearing of both ears 02/12/2013   Deviated septum 05/31/2018    Diabetes mellitus without complication (HCC)    Difficulty urinating    DM (diabetes mellitus) type II uncontrolled, periph vascular disorder 01/02/2016   Dry skin 05/09/2019   Dysuria 11/29/2010   Essential (primary) hypertension 01/26/2015   Excessive daytime sleepiness 02/11/2018   FATIGUE 12/09/2007   Qualifier: Diagnosis of  By: Lillia Mountain RN, Brien Few    Fibromyalgia 03/02/2017   Food intolerance 05/09/2019   GERD 12/09/2007   Qualifier: Diagnosis of  By: Alphonzo Severance MD, Loni Dolly    GERD (gastroesophageal reflux disease)    GI bleed 02/10/2015   Headache(784.0)    Hearing loss    Hemangioma 02/12/2013   Hemorrhoid 05/22/2011   HTN (hypertension) 02/04/2010   Qualifier: Diagnosis of  By: Abner Greenspan MD, Stacey     Hyperlipidemia    Hyperlipidemia associated with type 2 diabetes mellitus (HCC) 11/22/2020   Hyperlipidemia LDL goal <70 01/26/2015   Hypersomnia, persistent 11/05/2012   epworth of 18 points, was placed on CPAP in 2008 with very low AHI ( no number  Mentioned) and struggles ever since with PAP. 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  Leg swelling    Localized swelling of both lower legs 03/02/2017   Low back pain 07/20/2007   Qualifier: Diagnosis of  By: Alphonzo Severance MD, Loni Dolly    Lower extremity edema 07/04/2020   Lower GI bleed    Melena 12/16/2021   Mixed conductive and sensorineural hearing loss of right ear with restricted hearing of left ear 10/16/2020   Nasal congestion    Neck pain 03/15/2020   NEPHROLITHIASIS 08/13/2006   Qualifier: Diagnosis of  By: Bradly Bienenstock     Nocturia more  than twice per night 02/11/2018   OSA on CPAP 10/17/2014   Otalgia of both ears 06/06/2021   Other chronic pain 03/02/2017   Other fatigue 07/04/2020   Pain in finger of right hand 10/06/2012   Pain in joint of right shoulder 02/21/2017   Palpitations 01/26/2015   Paresthesia 07/25/2010   Persistent atrial fibrillation (HCC) 08/29/2020   Persistent headaches 10/17/2014   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   Qualifier: Diagnosis of  By: Alphonzo Severance MD, Ivar Drape R    PONV (postoperative nausea and vomiting)    Preoperative clearance 07/04/2020   Prolapsed hemorrhoids 10/05/2013   Radiculopathy, lumbar region 03/15/2020   Rash 05/24/2019   Rash and other nonspecific skin eruption 07/23/2022   Raynaud's disease 03/02/2017   Raynaud's phenomenon 03/02/2017   Rectal bleeding 02/12/2015   Rectal pain    Rectal ulcer with bleeding after hemorrhoid banding 02/11/2015   Rhinitis, chronic 12/23/2006   Qualifier: Diagnosis of  By: Lillia Mountain RN, Regina G    Right shoulder pain 02/15/2015   Seasonal and perennial allergic rhinitis 05/09/2019   Severe episode of recurrent major depressive disorder, 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   Sinus infection 06/30/2014   Sinusitis, acute, maxillary 02/02/2014   Sleep apnea    Sleep apnea with use of continuous positive airway pressure (CPAP) 02/11/2018   SOB (shortness of breath) 07/04/2020   Stroke (HCC)    Stroke (HCC)    Subacute confusional state 10/17/2014   SYMPTOM, HYPERSOMNIA NOS 02/19/2007   Qualifier: Diagnosis of  By: Vernie Murders     Thrombosed hemorrhoids    Tick bite of back 11/23/2019   Tinnitus of both ears 08/29/2020   Trouble swallowing    Unspecified hemorrhoids 02/10/2015   Unspecified sleep apnea 11/05/2012   UTI (urinary tract infection) 05/19/2022   Varicose veins of lower extremities with other complications 08/10/2012   Vasomotor rhinitis 06/28/2021   Vertigo 03/19/2012    Past Surgical History:  Procedure  Laterality Date   CATARACT EXTRACTION     x 3   EYE SURGERY  1968, 1985, 1987   cataracts   FLEXIBLE SIGMOIDOSCOPY N/A 02/11/2015   Procedure: FLEXIBLE SIGMOIDOSCOPY;  Surgeon: Iva Boop, MD;  Location: [facility];  Service: Endoscopy;  Laterality: N/A;   INNER EAR SURGERY     rt ear   INNER EAR SURGERY  1992   surgery - right arm     1983    Allergies  Allergen Reactions   Amoxicillin Hives, Itching and Other (See Comments)    White tongue; ? hives   Sulfa Antibiotics Hives and Rash   Terfenadine     ? reaction     Physical Exam: General: The patient is alert and oriented x3 in no acute distress.  Dermatology: There is no associated irritation around the left hallux nail plate.  The nail is very thick and dystrophic grossly..  Vascular: Palpable pedal pulses bilaterally. Capillary refill within  normal limits.  No appreciable edema.  No erythema.  Neurological: Grossly intact via light touch  Musculoskeletal Exam: No pedal deformities noted   Assessment/Plan of Care: 1. Cellulitis with symptomatic" dystrophic nail plate left great toe  -Patient evaluated.  X-rays reviewed -After evaluating the patient I do believe it would be appropriate to remove the left hallux nail plate in its entirety and allow the new nail to grow in.  This was discussed with in detail with the patient.  The patient agreed -The toe was prepped in aseptic manner and digital block performed using 3 mL of 2% lidocaine plain.  The nail was avulsed in's entirety followed by dry sterile dressings -Post care instructions provided -Return to clinic 3 weeks        Felecia Shelling, DPM [facility]  Dr. Felecia Shelling, DPM    [address] [place], [place] [address]                Office [phone]  Fax [phone]

## 2023-03-16 ENCOUNTER — Ambulatory Visit: Payer: Medicaid Other | Admitting: Podiatry

## 2023-03-16 DIAGNOSIS — L03032 Cellulitis of left toe: Secondary | ICD-10-CM

## 2023-03-16 NOTE — Progress Notes (Signed)
Chief Complaint  Patient presents with   Toe Pain    LEFT HALLUX STILL HAVING PAIN AND DRAINAGE,SWELLING HAS WENT DOWN SOME, NAIL AVULSION DONE ON 03/04/23    Subjective: 62 y.o. male presents today status post total temporary nail avulsion of the right hallux nail plate that was performed on 03/04/2023.  Patient doing well.  Initially the patient was concern for possible infection postoperatively but he is now doing well  Past Medical History:  Diagnosis Date   Abnormality of gait 11/10/2012   Acute GI bleeding    Acute renal insufficiency 02/10/2015   Acute upper respiratory infection 08/10/2012   Allergic drug reaction 05/24/2020   Allergic rhinitis 10/16/2020   Allergy    Anemia 02/10/2015   Annual physical exam 11/26/2011   ANXIETY DEPRESSION 12/09/2007   Qualifier: Diagnosis of  By: Lillia Mountain RN, Brien Few    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) 03/12/2018   Cerebrovascular accident, old 11/16/2014   Chest discomfort 06/08/2019   Chronic back pain 11/28/2020   Chronic cough 08/27/2014   CXR 08/2014:  Mild hyperinflation, no acute process Cleda Daub 09/2014:  Difficulty with procedure, but essentially normal +CVA's in past, with aspiration risk by history Speech evaluation 2016:      Chronic left shoulder pain 08/06/2020   Chronic pain syndrome 11/23/2019   Chronic pansinusitis 06/30/2018   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   Constipation    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   DDD (degenerative disc disease), cervical  04/26/2019   DDD (degenerative disc disease), lumbar 04/26/2019   Decreased hearing of both ears 02/12/2013   Deviated septum 05/31/2018   Diabetes mellitus without complication (HCC)    Difficulty urinating    DM (diabetes mellitus) type II uncontrolled, periph vascular disorder 01/02/2016   Dry skin 05/09/2019   Dysuria 11/29/2010   Essential (primary) hypertension 01/26/2015   Excessive daytime sleepiness 02/11/2018   FATIGUE 12/09/2007   Qualifier: Diagnosis of  By: Lillia Mountain RN, Brien Few    Fibromyalgia 03/02/2017   Food intolerance 05/09/2019   GERD 12/09/2007   Qualifier: Diagnosis of  By: Alphonzo Severance MD, Loni Dolly    GERD (gastroesophageal reflux disease)    GI bleed 02/10/2015   Headache(784.0)    Hearing loss    Hemangioma 02/12/2013   Hemorrhoid 05/22/2011   HTN (hypertension) 02/04/2010   Qualifier: Diagnosis of  By: Abner Greenspan MD, Stacey     Hyperlipidemia    Hyperlipidemia associated with type 2 diabetes mellitus (HCC) 11/22/2020   Hyperlipidemia LDL goal <70 01/26/2015   Hypersomnia, persistent 11/05/2012   epworth of 18 points, was placed on CPAP in 2008 with very low AHI ( no number  Mentioned) and struggles ever since with PAP. 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   Leg swelling    Localized swelling of both lower legs 03/02/2017   Low back pain 07/20/2007   Qualifier: Diagnosis of  By: Alphonzo Severance MD, Loni Dolly    Lower extremity edema 07/04/2020   Lower GI bleed    Melena 12/16/2021   Mixed conductive and sensorineural hearing loss of right ear with restricted hearing of left ear 10/16/2020    Nasal congestion    Neck pain 03/15/2020   NEPHROLITHIASIS 08/13/2006   Qualifier: Diagnosis of  By: Bradly Bienenstock     Nocturia more than twice per night 02/11/2018   OSA on CPAP 10/17/2014   Otalgia of both ears 06/06/2021   Other chronic pain 03/02/2017   Other fatigue 07/04/2020   Pain in finger of right hand 10/06/2012   Pain in joint of right shoulder 02/21/2017   Palpitations 01/26/2015   Paresthesia 07/25/2010   Persistent atrial fibrillation (HCC) 08/29/2020   Persistent headaches 10/17/2014   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   Qualifier: Diagnosis of  By: Alphonzo Severance MD, Ivar Drape R    PONV (postoperative nausea and vomiting)    Preoperative clearance 07/04/2020   Prolapsed hemorrhoids 10/05/2013   Radiculopathy, lumbar region 03/15/2020   Rash 05/24/2019   Rash and other nonspecific skin eruption 07/23/2022   Raynaud's disease 03/02/2017   Raynaud's phenomenon 03/02/2017   Rectal bleeding 02/12/2015   Rectal pain    Rectal ulcer with bleeding after hemorrhoid banding 02/11/2015   Rhinitis, chronic 12/23/2006   Qualifier: Diagnosis of  By: Lillia Mountain RN, Regina G    Right shoulder pain 02/15/2015   Seasonal and perennial allergic rhinitis 05/09/2019   Severe episode of recurrent major depressive disorder, 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   Sinus infection 06/30/2014   Sinusitis, acute, maxillary 02/02/2014   Sleep apnea    Sleep apnea with use of continuous positive airway pressure (CPAP) 02/11/2018   SOB (shortness of breath) 07/04/2020   Stroke (HCC)    Stroke (HCC)    Subacute confusional state 10/17/2014   SYMPTOM, HYPERSOMNIA NOS 02/19/2007   Qualifier: Diagnosis of  By: Vernie Murders     Thrombosed hemorrhoids    Tick bite of back 11/23/2019   Tinnitus of both ears 08/29/2020   Trouble swallowing    Unspecified hemorrhoids 02/10/2015   Unspecified sleep apnea 11/05/2012   UTI (urinary tract infection) 05/19/2022   Varicose veins of lower extremities  with other complications 08/10/2012   Vasomotor rhinitis 06/28/2021   Vertigo 03/19/2012    03/16/2023 LT great toe  Objective: Neurovascular status intact.  Skin is warm, dry and supple. Nail bed and respective nail fold appears to be healing appropriately.  There does appear to be some slight irritation of the skin possibly secondary to adhesive Band-Aid  Assessment: #1 s/p total temporary nail avulsion left hallux nail plate   Plan of care: -Patient was evaluated  -Silvadene cream provided.  Apply twice daily -Do not recommend any adhesive Band-Aid.  There is no drainage and it appears very dry and stable.  I do not believe he needs to apply any Band-Aid which can continue to potentially irritate the skin -Recommend clean cotton socks and tennis shoes -Return to clinic as needed   Felecia Shelling, DPM [facility]  Dr. Felecia Shelling, DPM    [address]  [place], [place] [address]                Office [phone]  Fax [phone]

## 2023-03-20 ENCOUNTER — Other Ambulatory Visit: Payer: Self-pay | Admitting: Family Medicine

## 2023-03-20 DIAGNOSIS — Z1212 Encounter for screening for malignant neoplasm of rectum: Secondary | ICD-10-CM

## 2023-03-20 DIAGNOSIS — Z1211 Encounter for screening for malignant neoplasm of colon: Secondary | ICD-10-CM

## 2023-03-25 ENCOUNTER — Ambulatory Visit: Payer: Medicaid Other | Admitting: Podiatry

## 2023-03-26 ENCOUNTER — Other Ambulatory Visit: Payer: Self-pay | Admitting: Family Medicine

## 2023-03-26 ENCOUNTER — Encounter: Payer: Self-pay | Admitting: Physical Medicine & Rehabilitation

## 2023-03-26 ENCOUNTER — Encounter: Payer: Medicaid Other | Attending: Physical Medicine & Rehabilitation | Admitting: Physical Medicine & Rehabilitation

## 2023-03-26 VITALS — BP 137/82 | HR 83 | Ht 69.0 in | Wt 175.0 lb

## 2023-03-26 DIAGNOSIS — G8929 Other chronic pain: Secondary | ICD-10-CM | POA: Diagnosis present

## 2023-03-26 DIAGNOSIS — M797 Fibromyalgia: Secondary | ICD-10-CM | POA: Insufficient documentation

## 2023-03-26 DIAGNOSIS — E1142 Type 2 diabetes mellitus with diabetic polyneuropathy: Secondary | ICD-10-CM | POA: Insufficient documentation

## 2023-03-26 DIAGNOSIS — Z794 Long term (current) use of insulin: Secondary | ICD-10-CM | POA: Diagnosis not present

## 2023-03-26 DIAGNOSIS — I10 Essential (primary) hypertension: Secondary | ICD-10-CM

## 2023-03-26 DIAGNOSIS — M545 Low back pain, unspecified: Secondary | ICD-10-CM | POA: Diagnosis not present

## 2023-03-26 DIAGNOSIS — G894 Chronic pain syndrome: Secondary | ICD-10-CM | POA: Diagnosis not present

## 2023-03-26 MED ORDER — HYDROCODONE-ACETAMINOPHEN 7.5-325 MG/15ML PO SOLN
10.0000 mL | Freq: Two times a day (BID) | ORAL | 0 refills | Status: DC | PRN
Start: 2023-03-26 — End: 2023-06-25

## 2023-03-26 MED ORDER — PREGABALIN 50 MG PO CAPS: 50.0000 mg | ORAL_CAPSULE | Freq: Two times a day (BID) | ORAL | 4 refills | Status: DC

## 2023-03-26 NOTE — Progress Notes (Signed)
Subjective:    Patient ID: Chase [name], male    DOB: 1960-09-01, 62 y.o.   MRN: [id number]  HPI Chase [name] is a 62 y.o. 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) (03/12/2018), Cerebrovascular accident, old (11/16/2014), Chest discomfort (06/08/2019), Chronic back pain (11/28/2020), Chronic cough (08/27/2014), Chronic left shoulder pain (08/06/2020), Chronic pain syndrome (11/23/2019), Chronic pansinusitis (06/30/2018), Chronic prostatitis (12/06/2013), 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), Constipation, 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), DDD (degenerative disc disease), cervical (04/26/2019), DDD (degenerative disc disease), lumbar (04/26/2019), Decreased hearing of both ears (02/12/2013), Deviated septum (05/31/2018), Diabetes mellitus without complication (HCC), Difficulty urinating, DM (diabetes mellitus) type II uncontrolled, periph vascular disorder (01/02/2016), Dry skin (05/09/2019), Dysuria (11/29/2010), Essential (primary) hypertension (01/26/2015), Excessive daytime sleepiness (02/11/2018), FATIGUE (12/09/2007), Fibromyalgia (03/02/2017), Food intolerance (05/09/2019), GERD (12/09/2007), GERD (gastroesophageal reflux  disease), GI bleed (02/10/2015), Headache(784.0), Hearing loss, 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/12/2013), Hemorrhoid (05/22/2011), HTN (hypertension) (02/04/2010), Hyperlipidemia, Hyperlipidemia associated with type 2 diabetes mellitus (HCC) (11/22/2020), Hyperlipidemia LDL goal <70 (01/26/2015), Hypersomnia, persistent (11/05/2012), Hypertension, Hypokalemia (08/29/2020), Hypotension (07/04/2020), 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), 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), Increasing residual urine (03/10/2022), Insect bite (03/16/2018), Insomnia (08/05/2011), Internal and external hemorrhoids without complication (10/04/2012), Internal hemorrhoids (10/22/2014), 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), Leg swelling, Localized swelling of both lower legs (03/02/2017), Low back pain (07/20/2007), Lower extremity edema (07/04/2020), Lower GI bleed, Melena (12/16/2021), Mixed conductive and sensorineural hearing loss of right ear with restricted hearing of left ear (10/16/2020), Nasal congestion, Neck pain (03/15/2020), NEPHROLITHIASIS (08/13/2006), Nocturia more than twice per night (02/11/2018), OSA on CPAP (10/17/2014), Otalgia of both ears (06/06/2021), Other chronic pain (03/02/2017), Other fatigue (07/04/2020), Pain in finger of right hand (10/06/2012), Pain in joint of right shoulder (02/21/2017), Palpitations (01/26/2015), Paresthesia (07/25/2010), Persistent atrial fibrillation (HCC) (08/29/2020), Persistent headaches (10/17/2014), 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), PONV (postoperative nausea and vomiting), Preoperative clearance (07/04/2020), Prolapsed hemorrhoids (10/05/2013), Radiculopathy, lumbar region (03/15/2020), Rash (05/24/2019), Rash and other nonspecific skin eruption (07/23/2022), Raynaud's disease (03/02/2017), Raynaud's phenomenon (03/02/2017), Rectal bleeding (02/12/2015), Rectal pain, Rectal ulcer with bleeding after hemorrhoid banding (02/11/2015), Rhinitis, chronic  (12/23/2006), Right shoulder pain (02/15/2015), Seasonal and perennial allergic rhinitis (05/09/2019), Severe episode of recurrent major depressive disorder, without psychotic features (HCC) (08/29/2020), Sinus infection (06/30/2014), Sinusitis, acute, maxillary (02/02/2014), Sleep apnea, Sleep apnea with use of continuous positive airway pressure (CPAP) (02/11/2018), SOB (shortness of breath) (07/04/2020), Stroke (HCC), Stroke (HCC), Subacute confusional state (10/17/2014), SYMPTOM, HYPERSOMNIA NOS (02/19/2007), Thrombosed hemorrhoids, Tick bite of back (11/23/2019), Tinnitus of both ears (08/29/2020), Trouble swallowing, Unspecified hemorrhoids (02/10/2015), Unspecified sleep apnea (11/05/2012), UTI (urinary tract infection) (05/19/2022), Varicose veins of lower extremities with other complications (08/10/2012), Vasomotor rhinitis (06/28/2021), and Vertigo (03/19/2012).   They are presenting to PM&R clinic as a new patient for pain management evaluation. They were referred by Dr. Zola Button for treatment of chronic pain.    Chase [name] reports he has had chronic pain for about 10  years.  His speech is limited due to history of CVAs with aphasia.  Patient has neck pain with pain affecting his bilateral arms.  He also has lower back pain and pain in his legs.  He also has burning, pins-and-needles and tingling pain in his feet, suspected to be due to diabetes mellitus.  Walking and standing worsens his pain.  Pending backwards is also painful.  Patient indicates he has pain throughout his entire body.  He reports he was previously followed by Dr. Jordan Likes for his pain.  Patient reports he had injections in his back or neck and is unsure if this is beneficial.  He indicates he was later followed by other doctors that were prescribing him medications.  He has trouble swallowing pills so is taking liquid hydrocodone.  He says this medication is helping keep his pain under control.  He also takes gabapentin 100 mg 3 times  daily which she feels like is beneficial to the pain in his feet however he does not keep it controlled.   Chart review indicates he was previously diagnosed with fibromyalgia.   He reports history of Raynaud's phenomena   Patient is a poor historian and limitations due to aphasia         Red flag symptoms: No red flags for back pain endorsed in Hx or ROS   Medications tried:   Nsaids - limited by comorbidities  Tylenol - helps Opiates  hydrocodone  Gabapentin- TID 100mg ,helping  TCAs  -denies, doesn't recall  SNRIs  - effexor helps with mood      Other treatments: PT- helped in the past  Chiropractor TENs unit - Injections - back or neck injections not sure if this helped  Surgery- denies    Goals for pain control: Decrease overall body wide pain       Interval History 02/26/23 Chase [name] is here for follow-up regarding his back and neck pain, pain related to peripheral neuropathy, fibromyalgia.  He reports that pain is largely unchanged from last visit.  He has not consistently tried taking gabapentin 200 mg 3 times daily and has mostly been taking it at the 100 mg dose.  He has been having more pain in his left great toe, being treated with antibiotics by his PCP.  He has been walking with physical therapy and feels like this has been helping his balance and strength.  Physical therapy has also had a little benefit to his pain.  He reports he forgot to get records sent over from his prior pain clinic.  He says he stopped going there because they started doing more procedure treatments.  He says they referred him to a different provider but he did not follow-up on this.  He reports his pain was better controlled when he was taking the hydrocodone liquid.  He was able to walk a lot farther and do a lot more at home.   Interval History 03/26/23 Patient is here for follow-up regarding his chronic pain.  Pain is greatest in his neck, back, legs.  Patient does think that Lyrica has  been beneficial, not having any side effects at this time.  He continues to be frustrated by his aphasia, limiting his ability to communicate.   Pain Inventory Average Pain 6 Pain Right Now 7 My pain is intermittent, constant, sharp, burning, dull, stabbing, tingling, and aching  In the last 24 hours, has pain interfered with the following? General activity 5 Relation with others 7 Enjoyment of life 7 What  TIME of day is your pain at its worst? morning , daytime, evening, and night Sleep (in general) Fair  Pain is worse with: walking, bending, sitting, standing, and some activites Pain improves with: therapy/exercise, medication, and heat Relief from Meds: 6  Family History  Problem Relation Age of Onset   Breast cancer Mother 20       breast   Huntington's disease Mother    Stroke Father    Heart disease Father    Hypertension Father    Heart attack Father    Hypertension Brother    Hyperlipidemia Neg Hx    Diabetes Neg Hx    Social History   Socioeconomic History   Marital status: Single    Spouse name: Not on file   Number of children: 0   Years of education: Not on file   Highest education level: 12th grade  Occupational History   Not on file  Tobacco Use   Smoking status: Never   Smokeless tobacco: Never  Vaping Use   Vaping status: Never Used  Substance and Sexual Activity   Alcohol use: No    Alcohol/week: 0.0 standard drinks of alcohol    Comment: none   Drug use: No   Sexual activity: Never  Other Topics Concern   Not on file  Social History Narrative   No caffeine intake except for chocolate.  Exercised-less walking    Social Determinants of Health   Financial Resource Strain: Medium Risk (02/17/2023)   Overall Financial Resource Strain (CARDIA)    Difficulty of Paying Living Expenses: Somewhat hard  Food Insecurity: Food Insecurity Present (02/17/2023)   Hunger Vital Sign    Worried About Running Out of Food in the Last Year: Sometimes true     Ran Out of Food in the Last Year: Patient declined  Transportation Needs: Patient Declined (02/17/2023)   PRAPARE - Administrator, Civil Service (Medical): Patient declined    Lack of Transportation (Non-Medical): Patient declined  Physical Activity: Unknown (02/17/2023)   Exercise Vital Sign    Days of Exercise per Week: Patient declined    Minutes of Exercise per Session: Not on file  Stress: Stress Concern Present (02/17/2023)   [employer] of Occupational Health - Occupational Stress Questionnaire    Feeling of Stress : To some extent  Social Connections: Unknown (02/17/2023)   Social Connection and Isolation Panel [NHANES]    Frequency of Communication with Friends and Family: Patient declined    Frequency of Social Gatherings with Friends and Family: Patient declined    Attends Religious Services: Patient declined    Database administrator or Organizations: Patient declined    Attends Engineer, structural: Not on file    Marital Status: Patient declined   Past Surgical History:  Procedure Laterality Date   CATARACT EXTRACTION     x 3   EYE SURGERY  1968, 1985, 1987   cataracts   FLEXIBLE SIGMOIDOSCOPY N/A 02/11/2015   Procedure: FLEXIBLE SIGMOIDOSCOPY;  Surgeon: Iva Boop, MD;  Location: [facility];  Service: Endoscopy;  Laterality: N/A;   INNER EAR SURGERY     rt ear   INNER EAR SURGERY  1992   surgery - right arm     1983   Past Surgical History:  Procedure Laterality Date   CATARACT EXTRACTION     x 3   EYE SURGERY  1968, 1985, 1987   cataracts   FLEXIBLE SIGMOIDOSCOPY N/A 02/11/2015   Procedure: FLEXIBLE  SIGMOIDOSCOPY;  Surgeon: Iva Boop, MD;  Location: [facility];  Service: Endoscopy;  Laterality: N/A;   INNER EAR SURGERY     rt ear   INNER EAR SURGERY  1992   surgery - right arm     1983   Past Medical History:  Diagnosis Date   Abnormality of gait 11/10/2012   Acute GI bleeding    Acute renal insufficiency 02/10/2015    Acute upper respiratory infection 08/10/2012   Allergic drug reaction 05/24/2020   Allergic rhinitis 10/16/2020   Allergy    Anemia 02/10/2015   Annual physical exam 11/26/2011   ANXIETY DEPRESSION 12/09/2007   Qualifier: Diagnosis of  By: Lillia Mountain RN, Brien Few    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) 03/12/2018   Cerebrovascular accident, old 11/16/2014   Chest discomfort 06/08/2019   Chronic back pain 11/28/2020   Chronic cough 08/27/2014   CXR 08/2014:  Mild hyperinflation, no acute process Cleda Daub 09/2014:  Difficulty with procedure, but essentially normal +CVA's in past, with aspiration risk by history Speech evaluation 2016:      Chronic left shoulder pain 08/06/2020   Chronic pain syndrome 11/23/2019   Chronic pansinusitis 06/30/2018   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   Constipation    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   DDD (degenerative disc disease), cervical 04/26/2019   DDD (degenerative disc disease), lumbar 04/26/2019   Decreased hearing of both ears 02/12/2013   Deviated septum 05/31/2018   Diabetes mellitus without complication (HCC)    Difficulty urinating    DM (diabetes mellitus) type II uncontrolled, periph vascular disorder 01/02/2016   Dry skin 05/09/2019   Dysuria 11/29/2010   Essential (primary) hypertension 01/26/2015   Excessive daytime sleepiness 02/11/2018   FATIGUE 12/09/2007   Qualifier: Diagnosis of  By: Lillia Mountain RN, Brien Few    Fibromyalgia 03/02/2017   Food intolerance 05/09/2019   GERD  12/09/2007   Qualifier: Diagnosis of  By: Alphonzo Severance MD, Loni Dolly    GERD (gastroesophageal reflux disease)    GI bleed 02/10/2015   Headache(784.0)    Hearing loss    Hemangioma 02/12/2013   Hemorrhoid 05/22/2011   HTN (hypertension) 02/04/2010   Qualifier: Diagnosis of  By: Abner Greenspan MD, Stacey     Hyperlipidemia    Hyperlipidemia associated with type 2 diabetes mellitus (HCC) 11/22/2020   Hyperlipidemia LDL goal <70 01/26/2015   Hypersomnia, persistent 11/05/2012   epworth of 18 points, was placed on CPAP in 2008 with very low AHI ( no number  Mentioned) and struggles ever since with PAP. 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   Leg swelling    Localized  swelling of both lower legs 03/02/2017   Low back pain 07/20/2007   Qualifier: Diagnosis of  By: Alphonzo Severance MD, Loni Dolly    Lower extremity edema 07/04/2020   Lower GI bleed    Melena 12/16/2021   Mixed conductive and sensorineural hearing loss of right ear with restricted hearing of left ear 10/16/2020   Nasal congestion    Neck pain 03/15/2020   NEPHROLITHIASIS 08/13/2006   Qualifier: Diagnosis of  By: Bradly Bienenstock     Nocturia more than twice per night 02/11/2018   OSA on CPAP 10/17/2014   Otalgia of both ears 06/06/2021   Other chronic pain 03/02/2017   Other fatigue 07/04/2020   Pain in finger of right hand 10/06/2012   Pain in joint of right shoulder 02/21/2017   Palpitations 01/26/2015   Paresthesia 07/25/2010   Persistent atrial fibrillation (HCC) 08/29/2020   Persistent headaches 10/17/2014   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   Qualifier:  Diagnosis of  By: Alphonzo Severance MD, Ivar Drape R    PONV (postoperative nausea and vomiting)    Preoperative clearance 07/04/2020   Prolapsed hemorrhoids 10/05/2013   Radiculopathy, lumbar region 03/15/2020   Rash 05/24/2019   Rash and other nonspecific skin eruption 07/23/2022   Raynaud's disease 03/02/2017   Raynaud's phenomenon 03/02/2017   Rectal bleeding 02/12/2015   Rectal pain    Rectal ulcer with bleeding after hemorrhoid banding 02/11/2015   Rhinitis, chronic 12/23/2006   Qualifier: Diagnosis of  By: Lillia Mountain RN, Regina G    Right shoulder pain 02/15/2015   Seasonal and perennial allergic rhinitis 05/09/2019   Severe episode of recurrent major depressive disorder, 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   Sinus infection 06/30/2014   Sinusitis, acute, maxillary 02/02/2014   Sleep apnea    Sleep apnea with use of continuous positive airway pressure (CPAP) 02/11/2018   SOB (shortness of breath) 07/04/2020   Stroke (HCC)    Stroke (HCC)    Subacute confusional state 10/17/2014   SYMPTOM, HYPERSOMNIA NOS 02/19/2007   Qualifier: Diagnosis of  By: Vernie Murders     Thrombosed hemorrhoids    Tick bite of back 11/23/2019   Tinnitus of both ears 08/29/2020   Trouble swallowing    Unspecified hemorrhoids 02/10/2015   Unspecified sleep apnea 11/05/2012   UTI (urinary tract infection) 05/19/2022   Varicose veins of lower extremities with other complications 08/10/2012   Vasomotor rhinitis 06/28/2021   Vertigo 03/19/2012   There were no vitals taken for this visit.  Opioid Risk Score:   Fall Risk Score:  `1  Depression screen [facility] 2/9     01/15/2023    3:06 PM 12/22/2022    2:24 PM 06/20/2022    3:08 PM 12/16/2021   10:41 AM 07/03/2020   12:04 PM 10/21/2018   10:23 AM 08/04/2017   10:51 AM  Depression screen PHQ 2/9  Decreased Interest 2 2 0 0 0 0 --  Down, Depressed, Hopeless 2 3 0 0 0 1 2  PHQ - 2 Score 4 5 0 0 0 1 2  Altered sleeping 3 2 0 0  3 1  Tired, decreased energy 2 2  0 0  1 2  Change in appetite 2 1 0 0  0 2  Feeling bad or failure about yourself  2 0 0 0  0 3  Trouble concentrating 2 1 0 0  1 3  Moving slowly or fidgety/restless 3 1 0 0  1 2  Suicidal thoughts 0 0 0  0  0 0  PHQ-9 Score 18 12 0 0  7 15  Difficult doing work/chores Somewhat difficult Very difficult Not difficult at all Not difficult at all  Somewhat difficult       Review of Systems  HENT:  Positive for voice change.   Eyes:  Positive for visual disturbance.  Musculoskeletal:  Positive for back pain, gait problem and neck pain. Negative for neck stiffness.       Pain in both shoulders, Feet, legs, arms  All other systems reviewed and are negative.      Objective:   Physical Exam  Gen: no distress, normal appearing HEENT: oral mucosa pink and moist, NCAT Cardio: Reg rate Chest: normal effort, normal rate of breathing Abd: soft, non-distended Ext: no edema Psych: pleasant, becomes frustrated with aphasia  skin: intact Left great toe small bandage in place Neuro: Alert and awake, follows commands, cranial nerves II through XII grossly intact Patient has significant aphasia appears to be more expressive than receptive Sensory exam normal for light touch and pain in all 4 limbs. No limb ataxia or cerebellar signs.    Musculoskeletal:  Spurling's test negative Kyphotic posture Pain with spinal extension and facet loading Patient with diffuse tenderness to palpation throughout bilateral upper and lower extremities-unchanged Paraspinal tenderness greatest at T spine and upper L spine SLR negative however resulted in lower back pain Mild tenderness throughout b/l UE and LEs  Lumbar spine MRI 06/05/2021 IMPRESSION: 1. No significant lumbar spine disc protrusion, foraminal stenosis or central canal stenosis. 2. 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 Differential considerations  include a nerve sheath tumor versus myxopapillary ependymoma. Metastatic disease is considered less likely given the lack of significant interval change.     Cervical spine MRI 01/09/2020 IMPRESSION: 1. C4-5 mild degenerative spinal stenosis which has improved from 2013 due to interval regression of a central herniation. 2. C5-6 advanced right foraminal stenosis. Mild to moderate foraminal narrowing bilaterally at C4-5 and on the left at C5-6.          Assessment & Plan:    1) Fibromyalgia -Overall his exam appears to be most consistent with fibromyalgia, has very diffuse tenderness and pain 2)  Peripheral polyneuropathy likely due to diabetes mellitus type 3) chronic lower back pain and chronic neck pain. 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  Denies SI or HI 5) History of CVA.  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   I called prior clinic and requested for records to be faxed over, consider repeating this  5) Continue PT 6) continue venlafaxine XR 7) continue Flexeril as needed 8) consider Qutenza if polyneuropathy pain does not improve with oral medications 9) consider repeat L spine MRI 10) continue monitor UDS, PDMP, pill counts

## 2023-03-28 ENCOUNTER — Encounter: Payer: Self-pay | Admitting: Physical Medicine & Rehabilitation

## 2023-04-02 ENCOUNTER — Telehealth: Payer: Self-pay

## 2023-04-02 NOTE — Telephone Encounter (Signed)
PA submitted for [place], [place] Case ID #: [id number]

## 2023-04-04 ENCOUNTER — Other Ambulatory Visit: Payer: Self-pay | Admitting: Family Medicine

## 2023-04-14 ENCOUNTER — Other Ambulatory Visit (HOSPITAL_BASED_OUTPATIENT_CLINIC_OR_DEPARTMENT_OTHER): Payer: Self-pay

## 2023-04-23 ENCOUNTER — Encounter: Payer: Medicaid Other | Attending: Physical Medicine & Rehabilitation | Admitting: Physical Medicine & Rehabilitation

## 2023-04-23 ENCOUNTER — Encounter: Payer: Self-pay | Admitting: Physical Medicine & Rehabilitation

## 2023-04-23 DIAGNOSIS — F4310 Post-traumatic stress disorder, unspecified: Secondary | ICD-10-CM | POA: Diagnosis not present

## 2023-04-23 DIAGNOSIS — K219 Gastro-esophageal reflux disease without esophagitis: Secondary | ICD-10-CM | POA: Diagnosis not present

## 2023-04-23 DIAGNOSIS — E1142 Type 2 diabetes mellitus with diabetic polyneuropathy: Secondary | ICD-10-CM | POA: Diagnosis not present

## 2023-04-23 DIAGNOSIS — M1990 Unspecified osteoarthritis, unspecified site: Secondary | ICD-10-CM | POA: Diagnosis not present

## 2023-04-23 DIAGNOSIS — F322 Major depressive disorder, single episode, severe without psychotic features: Secondary | ICD-10-CM | POA: Insufficient documentation

## 2023-04-23 DIAGNOSIS — M542 Cervicalgia: Secondary | ICD-10-CM | POA: Diagnosis not present

## 2023-04-23 DIAGNOSIS — Z0000 Encounter for general adult medical examination without abnormal findings: Secondary | ICD-10-CM | POA: Diagnosis not present

## 2023-04-23 DIAGNOSIS — F32A Depression, unspecified: Secondary | ICD-10-CM | POA: Diagnosis not present

## 2023-04-23 DIAGNOSIS — M109 Gout, unspecified: Secondary | ICD-10-CM | POA: Diagnosis not present

## 2023-04-23 DIAGNOSIS — Z79899 Other long term (current) drug therapy: Secondary | ICD-10-CM | POA: Diagnosis not present

## 2023-04-23 DIAGNOSIS — E785 Hyperlipidemia, unspecified: Secondary | ICD-10-CM | POA: Diagnosis not present

## 2023-04-23 MED ORDER — VENLAFAXINE HCL ER 75 MG PO CP24
150.0000 mg | ORAL_CAPSULE | Freq: Every day | ORAL | 1 refills | Status: DC
Start: 2023-04-23 — End: 2023-05-16

## 2023-04-23 NOTE — Progress Notes (Signed)
Subjective:    Chase [name] ID: Chase [name], male    DOB: February 09, 1961, 62 y.o.   MRN: [id number]  HPI Chase [name] is a 62 y.o. 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) (03/12/2018), Cerebrovascular accident, old (11/16/2014), Chest discomfort (06/08/2019), Chronic back pain (11/28/2020), Chronic cough (08/27/2014), Chronic left shoulder pain (08/06/2020), Chronic pain syndrome (11/23/2019), Chronic pansinusitis (06/30/2018), Chronic prostatitis (12/06/2013), 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), Constipation, 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), DDD (degenerative disc disease), cervical (04/26/2019), DDD (degenerative disc disease), lumbar (04/26/2019), Decreased hearing of both ears (02/12/2013), Deviated septum (05/31/2018), Diabetes mellitus without complication (HCC), Difficulty urinating, DM (diabetes mellitus) type II uncontrolled, periph vascular disorder (01/02/2016), Dry skin (05/09/2019), Dysuria (11/29/2010), Essential (primary) hypertension (01/26/2015), Excessive daytime sleepiness (02/11/2018), FATIGUE (12/09/2007), Fibromyalgia (03/02/2017), Food intolerance (05/09/2019), GERD (12/09/2007), GERD (gastroesophageal reflux  disease), GI bleed (02/10/2015), Headache(784.0), Hearing loss, 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/12/2013), Hemorrhoid (05/22/2011), HTN (hypertension) (02/04/2010), Hyperlipidemia, Hyperlipidemia associated with type 2 diabetes mellitus (HCC) (11/22/2020), Hyperlipidemia LDL goal <70 (01/26/2015), Hypersomnia, persistent (11/05/2012), Hypertension, Hypokalemia (08/29/2020), Hypotension (07/04/2020), 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), 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), Increasing residual urine (03/10/2022), Insect bite (03/16/2018), Insomnia (08/05/2011), Internal and external hemorrhoids without complication (10/04/2012), Internal hemorrhoids (10/22/2014), 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), Leg swelling, Localized swelling of both lower legs (03/02/2017), Low back pain (07/20/2007), Lower extremity edema (07/04/2020), Lower GI bleed, Melena (12/16/2021), Mixed conductive and sensorineural hearing loss of right ear with restricted hearing of left ear (10/16/2020), Nasal congestion, Neck pain (03/15/2020), NEPHROLITHIASIS (08/13/2006), Nocturia more than twice per night (02/11/2018), OSA on CPAP (10/17/2014), Otalgia of both ears (06/06/2021), Other chronic pain (03/02/2017), Other fatigue (07/04/2020), Pain in finger of right hand (10/06/2012), Pain in joint of right shoulder (02/21/2017), Palpitations (01/26/2015), Paresthesia (07/25/2010), Persistent atrial fibrillation (HCC) (08/29/2020), Persistent headaches (10/17/2014), 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), PONV (postoperative nausea and vomiting), Preoperative clearance (07/04/2020), Prolapsed hemorrhoids (10/05/2013), Radiculopathy, lumbar region (03/15/2020), Rash (05/24/2019), Rash and other nonspecific skin eruption (07/23/2022), Raynaud's disease (03/02/2017), Raynaud's phenomenon (03/02/2017), Rectal bleeding (02/12/2015), Rectal pain, Rectal ulcer with bleeding after hemorrhoid banding (02/11/2015), Rhinitis, chronic  (12/23/2006), Right shoulder pain (02/15/2015), Seasonal and perennial allergic rhinitis (05/09/2019), Severe episode of recurrent major depressive disorder, without psychotic features (HCC) (08/29/2020), Sinus infection (06/30/2014), Sinusitis, acute, maxillary (02/02/2014), Sleep apnea, Sleep apnea with use of continuous positive airway pressure (CPAP) (02/11/2018), SOB (shortness of breath) (07/04/2020), Stroke (HCC), Stroke (HCC), Subacute confusional state (10/17/2014), SYMPTOM, HYPERSOMNIA NOS (02/19/2007), Thrombosed hemorrhoids, Tick bite of back (11/23/2019), Tinnitus of both ears (08/29/2020), Trouble swallowing, Unspecified hemorrhoids (02/10/2015), Unspecified sleep apnea (11/05/2012), UTI (urinary tract infection) (05/19/2022), Varicose veins of lower extremities with other complications (08/10/2012), Vasomotor rhinitis (06/28/2021), and Vertigo (03/19/2012).   They are presenting to PM&R clinic as a new Chase [name] for pain management evaluation. They were referred by Dr. Zola Button for treatment of chronic pain.    Mr. Ostrom reports he has had chronic pain for about 62  years.  His speech is limited due to history of CVAs with aphasia.  Chase [name] has neck pain with pain affecting his bilateral arms.  He also has lower back pain and pain in his legs.  He also has burning, pins-and-needles and tingling pain in his feet, suspected to be due to diabetes mellitus.  Walking and standing worsens his pain.  Pending backwards is also painful.  Chase [name] indicates he has pain throughout his entire body.  He reports he was previously followed by Dr. Jordan Likes for his pain.  Chase [name] reports he had injections in his back or neck and is unsure if this is beneficial.  He indicates he was later followed by other doctors that were prescribing him medications.  He has trouble swallowing pills so is taking liquid hydrocodone.  He says this medication is helping keep his pain under control.  He also takes gabapentin 100 mg 3 times  daily which she feels like is beneficial to the pain in his feet however he does not keep it controlled.   Chart review indicates he was previously diagnosed with fibromyalgia.   He reports history of Raynaud's phenomena   Chase [name] is a poor historian and limitations due to aphasia         Red flag symptoms: No red flags for back pain endorsed in Hx or ROS   Medications tried:   Nsaids - limited by comorbidities  Tylenol - helps Opiates  hydrocodone  Gabapentin- TID 100mg ,helping  TCAs  -denies, doesn't recall  SNRIs  - effexor helps with mood      Other treatments: PT- helped in the past  Chiropractor TENs unit - Injections - back or neck injections not sure if this helped  Surgery- denies    Goals for pain control: Decrease overall body wide pain       Interval History 02/26/23 Chase [name] is here for follow-up regarding his back and neck pain, pain related to peripheral neuropathy, fibromyalgia.  He reports that pain is largely unchanged from last visit.  He has not consistently tried taking gabapentin 200 mg 3 times daily and has mostly been taking it at the 100 mg dose.  He has been having more pain in his left great toe, being treated with antibiotics by his PCP.  He has been walking with physical therapy and feels like this has been helping his balance and strength.  Physical therapy has also had a little benefit to his pain.  He reports he forgot to get records sent over from his prior pain clinic.  He says he stopped going there because they started doing more procedure treatments.  He says they referred him to a different provider but he did not follow-up on this.  He reports his pain was better controlled when he was taking the hydrocodone liquid.  He was able to walk a lot farther and do a lot more at home.   Interval History 03/26/23 Chase [name] is here for follow-up regarding his chronic pain.  Pain is greatest in his neck, back, legs.  Chase [name] does think that Lyrica has  been beneficial, not having any side effects at this time.  He continues to be frustrated by his aphasia, limiting his ability to communicate.  Interval History 04/23/2023 Chase [name] is here for follow-up regarding his chronic body wide pain.  Pain is the worst in his neck, lower back and legs.  Chase [name] does feel that Lyrica is reducing his pain, however not keeping it under good control.  Chase [name]  had insurance issues with the pharmacy and was unable to pick up his hydrocodone liquid, all of this appears to be resolved and pharmacy is ordering the medication for pickup.  Mood continues to be decreased and he feels depressed at times.  No SI or HI.  Pain Inventory Average Pain 5 Pain Right Now 7 My pain is intermittent, constant, sharp, burning, dull, stabbing, tingling, and aching  In the last 24 hours, has pain interfered with the following? General activity 7 Relation with others 7 Enjoyment of life 7 What TIME of day is your pain at its worst? morning , daytime, evening, and night Sleep (in general) Fair  Pain is worse with: walking, bending, sitting, standing, and some activites Pain improves with: rest, heat/ice, therapy/exercise, medication, and heat Relief from Meds: 6  Family History  Problem Relation Age of Onset   Breast cancer Mother 26       breast   Huntington's disease Mother    Stroke Father    Heart disease Father    Hypertension Father    Heart attack Father    Hypertension Brother    Hyperlipidemia Neg Hx    Diabetes Neg Hx    Social History   Socioeconomic History   Marital status: Single    Spouse name: Not on file   Number of children: 0   Years of education: Not on file   Highest education level: 12th grade  Occupational History   Not on file  Tobacco Use   Smoking status: Never   Smokeless tobacco: Never  Vaping Use   Vaping status: Never Used  Substance and Sexual Activity   Alcohol use: No    Alcohol/week: 0.0 standard drinks of alcohol     Comment: none   Drug use: No   Sexual activity: Never  Other Topics Concern   Not on file  Social History Narrative   No caffeine intake except for chocolate.  Exercised-less walking    Social Determinants of Health   Financial Resource Strain: Medium Risk (02/17/2023)   Overall Financial Resource Strain (CARDIA)    Difficulty of Paying Living Expenses: Somewhat hard  Food Insecurity: Food Insecurity Present (02/17/2023)   Hunger Vital Sign    Worried About Running Out of Food in the Last Year: Sometimes true    Ran Out of Food in the Last Year: Chase [name] declined  Transportation Needs: Chase [name] Declined (02/17/2023)   PRAPARE - Administrator, Civil Service (Medical): Chase [name] declined    Lack of Transportation (Non-Medical): Chase [name] declined  Physical Activity: Unknown (02/17/2023)   Exercise Vital Sign    Days of Exercise per Week: Chase [name] declined    Minutes of Exercise per Session: Not on file  Stress: Stress Concern Present (02/17/2023)   [employer] of Occupational Health - Occupational Stress Questionnaire    Feeling of Stress : To some extent  Social Connections: Unknown (02/17/2023)   Social Connection and Isolation Panel [NHANES]    Frequency of Communication with Friends and Family: Chase [name] declined    Frequency of Social Gatherings with Friends and Family: Chase [name] declined    Attends Religious Services: Chase [name] declined    Database administrator or Organizations: Chase [name] declined    Attends Banker Meetings: Not on file    Marital Status: Chase [name] declined   Past Surgical History:  Procedure Laterality Date   CATARACT EXTRACTION     x 3   EYE SURGERY  1968, 1985, 1987   cataracts  FLEXIBLE SIGMOIDOSCOPY N/A 02/11/2015   Procedure: FLEXIBLE SIGMOIDOSCOPY;  Surgeon: Iva Boop, MD;  Location: [facility];  Service: Endoscopy;  Laterality: N/A;   INNER EAR SURGERY     rt ear   INNER EAR SURGERY  1992   surgery - right arm     1983   Past  Surgical History:  Procedure Laterality Date   CATARACT EXTRACTION     x 3   EYE SURGERY  1968, 1985, 1987   cataracts   FLEXIBLE SIGMOIDOSCOPY N/A 02/11/2015   Procedure: FLEXIBLE SIGMOIDOSCOPY;  Surgeon: Iva Boop, MD;  Location: [facility];  Service: Endoscopy;  Laterality: N/A;   INNER EAR SURGERY     rt ear   INNER EAR SURGERY  1992   surgery - right arm     1983   Past Medical History:  Diagnosis Date   Abnormality of gait 11/10/2012   Acute GI bleeding    Acute renal insufficiency 02/10/2015   Acute upper respiratory infection 08/10/2012   Allergic drug reaction 05/24/2020   Allergic rhinitis 10/16/2020   Allergy    Anemia 02/10/2015   Annual physical exam 11/26/2011   ANXIETY DEPRESSION 12/09/2007   Qualifier: Diagnosis of  By: Lillia Mountain RN, Brien Few    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) 03/12/2018   Cerebrovascular accident, old 11/16/2014   Chest discomfort 06/08/2019   Chronic back pain 11/28/2020   Chronic cough 08/27/2014   CXR 08/2014:  Mild hyperinflation, no acute process Cleda Daub 09/2014:  Difficulty with procedure, but essentially normal +CVA's in past, with aspiration risk by history Speech evaluation 2016:      Chronic left shoulder pain 08/06/2020   Chronic pain syndrome 11/23/2019   Chronic pansinusitis 06/30/2018   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   Constipation    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   DDD (degenerative disc disease), cervical 04/26/2019   DDD (degenerative disc disease),  lumbar 04/26/2019   Decreased hearing of both ears 02/12/2013   Deviated septum 05/31/2018   Diabetes mellitus without complication (HCC)    Difficulty urinating    DM (diabetes mellitus) type II uncontrolled, periph vascular disorder 01/02/2016   Dry skin 05/09/2019   Dysuria 11/29/2010   Essential (primary) hypertension 01/26/2015   Excessive daytime sleepiness 02/11/2018   FATIGUE 12/09/2007   Qualifier: Diagnosis of  By: Letta Kocher    Fibromyalgia 03/02/2017   Food intolerance 05/09/2019   GERD 12/09/2007   Qualifier: Diagnosis of  By: Alphonzo Severance MD, Loni Dolly    GERD (gastroesophageal reflux disease)    GI bleed 02/10/2015   Headache(784.0)    Hearing loss    Hemangioma 02/12/2013   Hemorrhoid 05/22/2011   HTN (hypertension) 02/04/2010   Qualifier: Diagnosis of  By: Abner Greenspan MD, Stacey     Hyperlipidemia    Hyperlipidemia associated with type 2 diabetes mellitus (HCC) 11/22/2020   Hyperlipidemia LDL goal <70 01/26/2015   Hypersomnia, persistent 11/05/2012   epworth of 18 points, was placed  on CPAP in 2008 with very low AHI ( no number  Mentioned) and struggles ever since with PAP. 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   Leg swelling    Localized swelling of both lower legs 03/02/2017   Low back pain 07/20/2007   Qualifier: Diagnosis of  By: Alphonzo Severance MD, Loni Dolly    Lower extremity edema 07/04/2020   Lower GI bleed    Melena 12/16/2021   Mixed conductive and sensorineural hearing loss of right ear with restricted hearing of left ear 10/16/2020   Nasal congestion    Neck pain 03/15/2020    NEPHROLITHIASIS 08/13/2006   Qualifier: Diagnosis of  By: Bradly Bienenstock     Nocturia more than twice per night 02/11/2018   OSA on CPAP 10/17/2014   Otalgia of both ears 06/06/2021   Other chronic pain 03/02/2017   Other fatigue 07/04/2020   Pain in finger of right hand 10/06/2012   Pain in joint of right shoulder 02/21/2017   Palpitations 01/26/2015   Paresthesia 07/25/2010   Persistent atrial fibrillation (HCC) 08/29/2020   Persistent headaches 10/17/2014   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   Qualifier: Diagnosis of  By: Alphonzo Severance MD, Ivar Drape R    PONV (postoperative nausea and vomiting)    Preoperative clearance 07/04/2020   Prolapsed hemorrhoids 10/05/2013   Radiculopathy, lumbar region 03/15/2020   Rash 05/24/2019   Rash and other nonspecific skin eruption 07/23/2022   Raynaud's disease 03/02/2017   Raynaud's phenomenon 03/02/2017   Rectal bleeding 02/12/2015   Rectal pain    Rectal ulcer with bleeding after hemorrhoid banding 02/11/2015   Rhinitis, chronic 12/23/2006   Qualifier: Diagnosis of  By: Lillia Mountain RN, Regina G    Right shoulder pain 02/15/2015   Seasonal and perennial allergic rhinitis 05/09/2019   Severe episode of recurrent major depressive disorder, 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   Sinus infection 06/30/2014   Sinusitis, acute, maxillary 02/02/2014   Sleep apnea    Sleep apnea with use of continuous positive airway pressure (CPAP) 02/11/2018   SOB (shortness of breath) 07/04/2020   Stroke (HCC)    Stroke (HCC)    Subacute confusional state 10/17/2014   SYMPTOM, HYPERSOMNIA NOS 02/19/2007   Qualifier: Diagnosis of  By: Vernie Murders     Thrombosed hemorrhoids    Tick bite of back 11/23/2019   Tinnitus of both ears 08/29/2020   Trouble swallowing    Unspecified hemorrhoids 02/10/2015   Unspecified sleep apnea 11/05/2012   UTI (urinary tract infection) 05/19/2022   Varicose veins of lower extremities with other complications 08/10/2012    Vasomotor rhinitis 06/28/2021   Vertigo 03/19/2012   BP 130/80   Pulse 87   Ht 5\' 9"  (1.753 m)   Wt 178 lb 3.2 oz (80.8 kg)   SpO2 97%   BMI 26.32 kg/m   Opioid Risk Score:   Fall Risk Score:  `1  Depression screen [facility] 2/9     04/23/2023    3:16 PM 03/26/2023    3:11 PM 01/15/2023    3:06 PM 12/22/2022    2:24 PM 06/20/2022    3:08 PM 12/16/2021  10:41 AM 07/03/2020   12:04 PM  Depression screen PHQ 2/9  Decreased Interest 2 0 2 2 0 0 0  Down, Depressed, Hopeless 2 0 2 3 0 0 0  PHQ - 2 Score 4 0 4 5 0 0 0  Altered sleeping   3 2 0 0   Tired, decreased energy   2 2 0 0   Change in appetite   2 1 0 0   Feeling bad or failure about yourself    2 0 0 0   Trouble concentrating   2 1 0 0   Moving slowly or fidgety/restless   3 1 0 0   Suicidal thoughts   0 0 0 0   PHQ-9 Score   18 12 0 0   Difficult doing work/chores   Somewhat difficult Very difficult Not difficult at all Not difficult at all       Review of Systems  Constitutional: Negative.   HENT:  Positive for voice change.   Eyes:  Positive for visual disturbance.  Respiratory: Negative.    Cardiovascular: Negative.   Gastrointestinal: Negative.   Endocrine: Negative.   Genitourinary: Negative.   Musculoskeletal:  Positive for back pain, gait problem and neck pain. Negative for neck stiffness.       Pain in both shoulders, Feet, legs, arms  Skin: Negative.   Allergic/Immunologic: Negative.   Hematological: Negative.   Psychiatric/Behavioral:  Positive for dysphoric mood.   All other systems reviewed and are negative.      Objective:   Physical Exam  Gen: no distress, normal appearing HEENT: oral mucosa pink and moist, NCAT Chest: normal effort, normal rate of breathing Abd: soft, non-distended Ext: no edema Psych: Appropriate, cooperative skin: Warm and dry Neuro: Alert and awake, follows commands, cranial nerves II through XII grossly intact Chase [name] has significant aphasia appears to be more expressive  than receptive-often frustrated by deficits Sensory exam normal for light touch in all 4 limbs.    Musculoskeletal:  Spurling's test negative Kyphotic posture Pain with spinal extension and facet loading-unchanged Chase [name] with diffuse tenderness to palpation throughout bilateral upper and lower extremities-unchanged Paraspinal tenderness greatest at T spine and upper L spine Slump test negative bilaterally  Lumbar spine MRI 06/05/2021 IMPRESSION: 1. No significant lumbar spine disc protrusion, foraminal stenosis or central canal stenosis. 2. 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 Differential considerations include a nerve sheath tumor versus myxopapillary ependymoma. Metastatic disease is considered less likely given the lack of significant interval change.     Cervical spine MRI 01/09/2020 IMPRESSION: 1. C4-5 mild degenerative spinal stenosis which has improved from 2013 due to interval regression of a central herniation. 2. C5-6 advanced right foraminal stenosis. Mild to moderate foraminal narrowing bilaterally at C4-5 and on the left at C5-6.          Assessment & Plan:    1) Fibromyalgia -Overall his exam appears to be most consistent with fibromyalgia, has very diffuse tenderness and pain 2)  Peripheral polyneuropathy likely due to diabetes mellitus type 3) chronic lower back pain and chronic neck pain. 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  Denies SI or HI 5) History of CVA.  Chase [name] continues to have aphasia   Plan 1) Dc Gabapentin previously, increase lyrica to 50mg  BID, consider further increase at a later  time 2) Previously on liquid hydrocodone, Restart this medication at [facility] 7.5/15ml, 10ml Q12h PRN.  Chase [name] called by his pharmacy while  was there.  Medication has been ordered for pickup. 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   I called prior clinic and requested for records to be faxed over, consider repeating this  5) Continue PT 6) increased dose venlafaxine XR to 150 mg daily.  Hopefully this can help his pain and mood 7) continue Flexeril as needed 8) consider Qutenza if polyneuropathy pain does not improve with oral medications 9) consider repeat L spine MRI 10) continue monitor UDS, PDMP, pill counts

## 2023-04-29 ENCOUNTER — Encounter: Payer: Self-pay | Admitting: Internal Medicine

## 2023-04-29 ENCOUNTER — Other Ambulatory Visit: Payer: Self-pay | Admitting: Family Medicine

## 2023-04-29 DIAGNOSIS — E1159 Type 2 diabetes mellitus with other circulatory complications: Secondary | ICD-10-CM

## 2023-04-30 MED ORDER — FREESTYLE LIBRE 3 PLUS SENSOR MISC
1.0000 | 3 refills | Status: DC
Start: 2023-04-30 — End: 2023-08-03

## 2023-04-30 MED ORDER — FREESTYLE LIBRE 3 SENSOR MISC: 1.0000 | 3 refills | Status: DC

## 2023-05-13 ENCOUNTER — Other Ambulatory Visit: Payer: Self-pay | Admitting: Family Medicine

## 2023-05-15 ENCOUNTER — Other Ambulatory Visit: Payer: Self-pay | Admitting: Family Medicine

## 2023-05-15 ENCOUNTER — Other Ambulatory Visit: Payer: Self-pay | Admitting: Cardiology

## 2023-05-15 DIAGNOSIS — F322 Major depressive disorder, single episode, severe without psychotic features: Secondary | ICD-10-CM

## 2023-05-20 ENCOUNTER — Encounter: Payer: Self-pay | Admitting: Family Medicine

## 2023-05-28 ENCOUNTER — Ambulatory Visit: Payer: Medicaid Other | Attending: Cardiology | Admitting: Cardiology

## 2023-06-03 ENCOUNTER — Other Ambulatory Visit: Payer: Self-pay | Admitting: Family Medicine

## 2023-06-03 DIAGNOSIS — E785 Hyperlipidemia, unspecified: Secondary | ICD-10-CM

## 2023-06-05 ENCOUNTER — Encounter: Payer: Self-pay | Admitting: Internal Medicine

## 2023-06-05 ENCOUNTER — Ambulatory Visit: Payer: Medicaid Other | Admitting: Internal Medicine

## 2023-06-05 VITALS — BP 120/70 | HR 84 | Ht 69.0 in | Wt 177.8 lb

## 2023-06-05 DIAGNOSIS — Z7984 Long term (current) use of oral hypoglycemic drugs: Secondary | ICD-10-CM | POA: Diagnosis not present

## 2023-06-05 DIAGNOSIS — E1165 Type 2 diabetes mellitus with hyperglycemia: Secondary | ICD-10-CM

## 2023-06-05 DIAGNOSIS — E785 Hyperlipidemia, unspecified: Secondary | ICD-10-CM | POA: Diagnosis not present

## 2023-06-05 DIAGNOSIS — E1159 Type 2 diabetes mellitus with other circulatory complications: Secondary | ICD-10-CM | POA: Diagnosis not present

## 2023-06-05 MED ORDER — OZEMPIC (1 MG/DOSE) 4 MG/3ML ~~LOC~~ SOPN: 1.0000 mg | PEN_INJECTOR | SUBCUTANEOUS | 3 refills | Status: DC

## 2023-06-05 NOTE — Progress Notes (Signed)
Patient ID: Chase [name], male   DOB: 27-Feb-1961, 62 y.o.   MRN: [id number]  HPI: Chase [name] is a 62 y.o.-year-old male, presenting for follow-up for DM2, dx in 2014, non-insulin-dependent, uncontrolled, with complications (PVD, cerebro-vascular ds - s/p "mini"strokes, PN). Last visit 4 months ago. He had [employer], now [place] only.  Interim history: He has occasional blurry vision, no increased urination. He continues to have memory loss and difficulty word finding. At last visit, he was still drinking apple juice and I advised him to stop it. Now drinking much less -mostly diet drinks.  He continues to have chronic generalized pain and fibromyalgia.  HbA1c levels reviewed: Lab Results  Component Value Date   HGBA1C 8.2 (A) 02/02/2023   HGBA1C 7.8 (A) 09/19/2022   HGBA1C 8.2 (A) 05/20/2022   HGBA1C 6.5 (A) 01/14/2022   HGBA1C 6.7 (A) 09/10/2021   HGBA1C 6.7 (A) 06/03/2021   HGBA1C 6.2 (A) 01/03/2021   HGBA1C 6.7 (A) 09/10/2020   HGBA1C 8.2 (H) 05/30/2020   HGBA1C 8.3 (H) 05/24/2020   HGBA1C 9.7 (H) 04/05/2020   HGBA1C 8.3 (H) 05/24/2019   HGBA1C 7.9 (A) 08/04/2018   HGBA1C 7.5 (A) 12/21/2017   HGBA1C 7.4 09/17/2017   HGBA1C 7.8 06/19/2017   HGBA1C 7.1 (H) 02/19/2017   HGBA1C 7.4 02/21/2016   HGBA1C 9.8 (H) 11/13/2015   HGBA1C 6.8 (H) 02/11/2015   Pt is on a regimen of:  - Metformin 500 mg 1x a day >> 2x >> 1x a day b/c problems with swallowing >> 1-2x a day - Jardiance 25 mg at night >> before breakfast - Januvia 100 mg in am >> Victoza 1.8 mg weekly-started 05/2020 >> Ozempic 0.5 mg weekly In 11/2018, we tried to change from Januvia to Ozempic but this was not covered. Stopped Glipizide b/c rash. Stopped Invokana 100 mg in am >> only got this for 1 mo He was on Metformin 500 mg po bid - in liquid form, as he could not swallow pills. He gets gi upset. He stopped this in 2014.  His fingerstick values are not accurate due to Raynaud's syndrome, however,  freestyle libre CGM was not approved by his insurance for the last few years, until recently.  He was able to start the freestyle libre 3:  Prev.: - brunch: 123, 124-138 >> 139-181, 204, 210 >>  148, 152, 162, 163, 171 >> 128, 161 - 2h after brunch: 137-151 >> N/c >> 99-135, 145 >> 204-214 >> 145, 159 >> n/c - before dinner: 150-200 >> n/c >> 115 >> 109, 149 >> n/c  >> 140 - 2h after dinner:  180-210 >> 260 >> n/c >> 163, 183 >> n/c >> 212 - bedtime: 208 >> ? >> 164, 173 >> 121 >> n/c >> 145 >> n/c - nighttime: n/c >> 108-138, 151 >> n/c Lowest sugar was 108 >> 115 >> 107 >> 80s >>  >100;  he has hypoglycemia awareness in the 70s. Highest sugar was 261 (Prednisone) >> 183 >> 214 >> 212 >> 250  Glucometer: Bayer Contour Next >> AccuChek guide  In the past, he was occasional lemonade and ginger ale >> advised to stop - still drinking these but rarely.  -+ Mild CKD, last BUN/creatinine:  Lab Results  Component Value Date   BUN 8 02/17/2023   BUN 8 12/22/2022   CREATININE 1.06 02/17/2023   CREATININE 0.95 12/22/2022   Lab Results  Component Value Date   MICRALBCREAT 1.5 05/24/2020   MICRALBCREAT  1.0 08/04/2017   MICRALBCREAT 1.1 11/13/2015   MICRALBCREAT 1.8 10/03/2014   MICRALBCREAT 0.4 09/17/2012   -+ HL.  last set of lipids: Lab Results  Component Value Date   CHOL 134 12/22/2022   HDL 27.50 (L) 12/22/2022   LDLCALC 93 06/20/2022   LDLDIRECT 59.0 12/22/2022   TRIG 281.0 (H) 12/22/2022   CHOLHDL 5 12/22/2022  On pravastatin 40.  - last eye exam was in 2022: No DR reportedly.  Dr. Lucina Mellow.  - He has numbness and tingling in his feet, also pain.  Foot exam performed 03/04/2023 by Dr. Logan Bores.  He had paronychia on exam at that time.  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 Has varicose veins >> wears compresion hoses.He also has a h/o kidney stones.   On allopurinol for gout. He has anxiety, PTSD from  childhood.  Early in 2022, he was diagnosed with an intradural extramedullary spinal tumor.  It did not change in appearance on a recent MRI. He had prostatitis and UTIs. He is on Tamsulozin (changed from terazosin) and finasteride.  He was on nitrofurantoin.  ROS: + See HPI.  Past Medical History:  Diagnosis Date   Abnormality of gait 11/10/2012   Acute GI bleeding    Acute renal insufficiency 02/10/2015   Acute upper respiratory infection 08/10/2012   Allergic drug reaction 05/24/2020   Allergic rhinitis 10/16/2020   Allergy    ANXIETY DEPRESSION 12/09/2007   Qualifier: Diagnosis of  By: Lillia Mountain RN, Brien Few    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) 03/12/2018   Cerebrovascular accident, old 11/16/2014   Chest discomfort 06/08/2019   Chronic back pain 11/28/2020   Chronic cough 08/27/2014   CXR 08/2014:  Mild hyperinflation, no acute process Cleda Daub 09/2014:  Difficulty with procedure, but essentially normal +CVA's in past, with aspiration risk by history Speech evaluation 2016:      Chronic left shoulder pain 08/06/2020   Chronic pain syndrome 11/23/2019   Chronic pansinusitis 06/30/2018   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   Constipation    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   DDD (degenerative disc disease), cervical 04/26/2019   DDD (degenerative disc disease), lumbar 04/26/2019   Decreased hearing of both ears 02/12/2013   Deviated septum 05/31/2018   Diabetes mellitus without complication (HCC)    Difficulty urinating     DM (diabetes mellitus) type II uncontrolled, periph vascular disorder 01/02/2016   Dry skin 05/09/2019   Dysuria 11/29/2010   Essential (primary) hypertension 01/26/2015   Excessive daytime sleepiness 02/11/2018   FATIGUE 12/09/2007   Qualifier: Diagnosis of  By: Letta Kocher    Fibromyalgia 03/02/2017   Food intolerance 05/09/2019   GERD 12/09/2007   Qualifier: Diagnosis of  By: Alphonzo Severance MD, Loni Dolly    GERD (gastroesophageal reflux disease)    GI bleed 02/10/2015   Headache 12/23/2006   Qualifier: Diagnosis of   By: Lillia Mountain RN,  Brien Few     IMO SNOMED Dx Update Oct 2024     Hearing loss    Hemangioma 02/12/2013   Hemorrhoid 05/22/2011   HTN (hypertension) 02/04/2010   Qualifier: Diagnosis of  By: Abner Greenspan MD, Stacey     Hyperlipidemia    Hyperlipidemia associated with type 2 diabetes mellitus (HCC) 11/22/2020   Hyperlipidemia LDL goal <70 01/26/2015   Hypersomnia, persistent 11/05/2012   epworth of 18 points, was placed on CPAP in 2008 with very low AHI ( no number  Mentioned) and struggles ever since with PAP. 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   Leg swelling    Localized swelling of both lower legs 03/02/2017   Low back pain 07/20/2007   Qualifier: Diagnosis of  By: Alphonzo Severance MD, Loni Dolly    Lower extremity edema 07/04/2020   Lower GI bleed    Melena 12/16/2021   Mixed conductive and sensorineural hearing loss of right ear with restricted hearing of left ear 10/16/2020   Nasal congestion    Neck pain 03/15/2020   NEPHROLITHIASIS 08/13/2006   Qualifier: Diagnosis of  By: Bradly Bienenstock      Nocturia more than twice per night 02/11/2018   OSA on CPAP 10/17/2014   Otalgia of both ears 06/06/2021   Other chronic pain 03/02/2017   Other fatigue 07/04/2020   Pain in finger of right hand 10/06/2012   Pain in joint of right shoulder 02/21/2017   Palpitations 01/26/2015   Paresthesia 07/25/2010   Persistent atrial fibrillation (HCC) 08/29/2020   Persistent headaches 10/17/2014   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   Qualifier: Diagnosis of  By: Alphonzo Severance MD, Ivar Drape R    PONV (postoperative nausea and vomiting)    Preoperative clearance 07/04/2020   Preventative health care 11/26/2011   Prolapsed hemorrhoids 10/05/2013   Radiculopathy, lumbar region 03/15/2020   Rash 05/24/2019   Rash and other nonspecific skin eruption 07/23/2022   Raynaud's disease 03/02/2017   Raynaud's phenomenon 03/02/2017   Rectal bleeding 02/12/2015   Rectal pain    Rectal ulcer with bleeding after hemorrhoid banding 02/11/2015   Rhinitis, chronic 12/23/2006   Qualifier: Diagnosis of  By: Lillia Mountain RN, Regina G    Right shoulder pain 02/15/2015   Seasonal and perennial allergic rhinitis 05/09/2019   Severe episode of recurrent major depressive disorder, 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   Sinus infection 06/30/2014   Sinusitis, acute, maxillary 02/02/2014   Sleep apnea    Sleep apnea with use of continuous positive airway pressure (CPAP) 02/11/2018   SOB (shortness of breath) 07/04/2020   Stroke (HCC)    Stroke (HCC)    Subacute confusional state 10/17/2014   SYMPTOM, HYPERSOMNIA NOS 02/19/2007   Qualifier: Diagnosis of  By: Vernie Murders     Thrombosed hemorrhoids    Tick bite of back 11/23/2019   Tinnitus of both ears 08/29/2020   Trouble swallowing    Unspecified hemorrhoids 02/10/2015   UTI (urinary tract infection) 05/19/2022   Varicose veins of bilateral lower extremities with other complications 08/10/2012   IMO SNOMED Dx Update Oct 2024     Vasomotor rhinitis 06/28/2021  Vertigo 03/19/2012   Past Surgical History:  Procedure Laterality Date   CATARACT EXTRACTION     x 3   EYE SURGERY  1968, 1985, 1987   cataracts   FLEXIBLE SIGMOIDOSCOPY N/A 02/11/2015   Procedure: FLEXIBLE SIGMOIDOSCOPY;  Surgeon: Iva Boop, MD;  Location: [facility];  Service: Endoscopy;  Laterality: N/A;   INNER EAR SURGERY     rt ear   INNER EAR SURGERY  1992   surgery - right arm     1983   Social History   Socioeconomic History   Marital status: Single    Spouse name: Not on file   Number of children: 0   Years of education: Not on file   Highest education level: 12th grade  Occupational History   Not on file  Tobacco Use   Smoking status: Never   Smokeless tobacco: Never  Vaping Use   Vaping status: Never Used  Substance and Sexual Activity   Alcohol use: No    Alcohol/week: 0.0 standard drinks of alcohol    Comment: none   Drug use: No   Sexual activity: Never  Other Topics Concern   Not on file  Social History Narrative   No caffeine intake except for chocolate.  Exercised-less walking    Social Drivers of Health   Financial Resource Strain: Medium Risk (02/17/2023)   Overall Financial Resource Strain (CARDIA)    Difficulty of Paying Living Expenses: Somewhat hard  Food Insecurity: Food Insecurity Present (02/17/2023)   Hunger Vital Sign    Worried About Running Out of Food in the Last Year: Sometimes true    Ran Out of Food in the Last Year: Patient declined  Transportation Needs: Patient Declined (02/17/2023)   PRAPARE - Administrator, Civil Service (Medical): Patient declined    Lack of Transportation (Non-Medical): Patient declined  Physical Activity: Unknown (02/17/2023)   Exercise Vital Sign    Days of Exercise per Week: Patient declined    Minutes of Exercise per Session: Not on file  Stress: Stress Concern Present (02/17/2023)   [employer] of Occupational Health - Occupational Stress Questionnaire    Feeling of Stress : To  some extent  Social Connections: Unknown (02/17/2023)   Social Connection and Isolation Panel [NHANES]    Frequency of Communication with Friends and Family: Patient declined    Frequency of Social Gatherings with Friends and Family: Patient declined    Attends Religious Services: Patient declined    Database administrator or Organizations: Patient declined    Attends Banker Meetings: Not on file    Marital Status: Patient declined  Intimate Partner Violence: Unknown (01/17/2022)   Received from [employer], [facility]   HITS    Physically Hurt: Not on file    Insult or Talk Down To: Not on file    Threaten Physical Harm: Not on file    Scream or Curse: Not on file   Current Outpatient Medications on File Prior to Visit  Medication Sig Dispense Refill   Accu-Chek Softclix Lancets lancets Use as instructed 1x a day 100 each PRN   allopurinol (ZYLOPRIM) 100 MG tablet Take 1 tablet (100 mg total) by mouth 2 (two) times daily. 180 tablet 1   amLODipine (NORVASC) 2.5 MG tablet Take 1 tablet (2.5 mg total) by mouth daily. 30 tablet 1   azelastine (ASTELIN) 0.1 % nasal spray Place 1 spray into both nostrils 2 (two) times daily. Use in each nostril  as directed 30 mL 12   Azelastine HCl (ASTEPRO) 0.15 % SOLN Place 1 spray into the nose in the morning and at bedtime. 1 mL 3   BD PEN NEEDLE NANO 2ND GEN 32G X 4 MM MISC USE ONCE DAILY AS DIRECTED 100 each 3   clotrimazole-betamethasone (LOTRISONE) cream Apply 1 application topically as needed for rash.     Continuous Glucose Sensor (FREESTYLE LIBRE 3 PLUS SENSOR) MISC Inject 1 Device into the skin continuous. Change every 15 days 6 each 3   Continuous Glucose Sensor (FREESTYLE LIBRE 3 SENSOR) MISC 1 each by Does not apply route every 14 (fourteen) days. 6 each 3   cyclobenzaprine (FLEXERIL) 10 MG tablet TAKE 1 TABLET BY MOUTH TWICE A DAY AS NEEDED FOR MUSCLE SPASM 42 tablet 2   doxycycline (VIBRA-TABS) 100 MG tablet Take 1 tablet (100  mg total) by mouth 2 (two) times daily. 14 tablet 0   empagliflozin (JARDIANCE) 25 MG TABS tablet Take 1 tablet (25 mg total) by mouth daily. 90 tablet 3   EPINEPHrine 0.3 mg/0.3 mL IJ SOAJ injection Inject 0.3 mg into the muscle as needed for anaphylaxis. 1 each 0   famotidine (PEPCID) 40 MG/5ML suspension TAKE 2.5 MLS (20 MG TOTAL) BY MOUTH DAILY. 50 mL 2   FIBER SELECT GUMMIES PO Take 1 tablet by mouth daily.     finasteride (PROSCAR) 5 MG tablet Take 5 mg by mouth daily.     fluticasone (CUTIVATE) 0.05 % cream Apply 1 application topically 2 (two) times daily.     GAVILAX 17 GM/SCOOP powder TAKE 17 G BY MOUTH DAILY AS NEEDED. 238 g 5   glucose blood (ACCU-CHEK GUIDE) test strip Use as instructed 1x a day - 100 each PRN   HYDROcodone-acetaminophen (HYCET) 7.5-325 mg/15 ml solution Take 10 mLs by mouth every 12 (twelve) hours as needed for moderate pain. 600 mL 0   hydrocortisone (ANUSOL-HC) 2.5 % rectal cream PLACE 1 APPLICATION RECTALLY 2 (TWO) TIMES DAILY. 30 g 2   hydrOXYzine (ATARAX) 25 MG tablet Take 1 tablet (25 mg total) by mouth every 8 (eight) hours as needed for anxiety or itching. 270 tablet 0   losartan (COZAAR) 100 MG tablet TAKE 1 TABLET BY MOUTH EVERY DAY 90 tablet 1   metFORMIN (GLUCOPHAGE) 500 MG tablet TAKE 1 TABLET BY MOUTH 2 TIMES DAILY WITH A MEAL. 180 tablet 1   metoprolol tartrate (LOPRESSOR) 50 MG tablet TAKE 1 TABLET BY MOUTH EVERY DAY 90 tablet 1   OZEMPIC, 0.25 OR 0.5 MG/DOSE, 2 MG/3ML SOPN Inject 0.5 mg into the skin once a week. 9 mL 3   pravastatin (PRAVACHOL) 20 MG tablet TAKE 2 TABLETS (40 MG TOTAL) BY MOUTH DAILY. 180 tablet 1   pregabalin (LYRICA) 50 MG capsule Take 1 capsule (50 mg total) by mouth 2 (two) times daily. 60 capsule 4   promethazine-dextromethorphan (PROMETHAZINE-DM) 6.25-15 MG/5ML syrup Take 5 mLs by mouth 4 (four) times daily as needed. 118 mL 0   tamsulosin (FLOMAX) 0.4 MG CAPS capsule Take 0.4 mg by mouth daily.     triamcinolone (NASACORT)  55 MCG/ACT AERO nasal inhaler Place 2 sprays into the nose daily. 1 each 12   venlafaxine XR (EFFEXOR-XR) 75 MG 24 hr capsule TAKE 1 CAPSULE BY MOUTH DAILY WITH BREAKFAST. 90 capsule 1   No current facility-administered medications on file prior to visit.   Allergies  Allergen Reactions   Amoxicillin Hives, Itching and Other (See Comments)    White  tongue; ? hives   Sulfa Antibiotics Hives and Rash   Terfenadine     ? reaction   Family History  Problem Relation Age of Onset   Breast cancer Mother 35       breast   Huntington's disease Mother    Stroke Father    Heart disease Father    Hypertension Father    Heart attack Father    Hypertension Brother    Hyperlipidemia Neg Hx    Diabetes Neg Hx    PE: BP 120/70   Pulse 84   Ht 5\' 9"  (1.753 m)   Wt 177 lb 12.8 oz (80.6 kg)   SpO2 98%   BMI 26.26 kg/m  Wt Readings from Last 10 Encounters:  06/05/23 177 lb 12.8 oz (80.6 kg)  04/23/23 178 lb 3.2 oz (80.8 kg)  03/26/23 175 lb (79.4 kg)  02/26/23 178 lb 12.8 oz (81.1 kg)  02/17/23 179 lb 3.2 oz (81.3 kg)  02/02/23 176 lb (79.8 kg)  01/15/23 174 lb 12.8 oz (79.3 kg)  12/22/22 176 lb 9.6 oz (80.1 kg)  09/19/22 174 lb 3.2 oz (79 kg)  07/30/22 175 lb 1.9 oz (79.4 kg)   Constitutional: Slightly overweight, in NAD Eyes: EOMI, no exophthalmos ENT: no thyromegaly, no cervical lymphadenopathy Cardiovascular: R RR, No MRG, + mild swelling and pain on palpation in B LE  -wears compression hoses Respiratory: CTA B Musculoskeletal: no deformities Skin: + Rash with cracked skin on dorsum of bilateral hands Neurological: no tremor with outstretched hands  ASSESSMENT: 1. DM2, non-insulin-dependent, uncontrolled, with complications - PVD - cerebro-vascular ds - s/p "mini"strokes - PN  2. HL  PLAN:  1. Patient with history of uncontrolled type 2 diabetes, on metformin, SGLT2 inhibitor and weekly GLP-1 receptor agonist, with improved control after switching from Januvia to  Victoza and then finally to Ozempic.  HbA1c decreased to 6.5 at that time, but afterwards it started to increase.  At last visit, it was 8.2%.  At that time I suggested to switch to take Jardiance in the morning as he was taking it later in the day and I again suggested a CGM and tried again to send a prescription for this to the pharmacy.  He was finally able to obtain this after several years of denials! GM interpretation: -At today's visit, we reviewed his CGM downloads: It appears that 32% of values are in target range (goal >70%), while 68 are higher than 180 (goal <25%), and 0% are lower than 70 (goal <4%).  The calculated average blood sugar is 201.  The projected HbA1c for the next 3 months (GMI) is 8.1%. -Reviewing the CGM trends, sugars appear to be fluctuating within a narrow range slightly above 180.  We did discuss about possible options to improve this including long-acting insulin, which we will try to avoid for now.  We also discussed about TZD's, and this may be something that we can try later, but I would like to avoid this due to possible side effects.  For now, my suggestion was to try to increase Ozempic dose.  He tolerated it well.  He agrees to try this.   - I suggested to:  Patient Instructions  Please continue: - Metformin 500 mg 1-2x a day - Jardiance 25 mg before b'fast  Try to increase: - Ozempic 1 mg weekly  Please return in 4 months.  - we checked his HbA1c: 8.1% (lower) - advised to check sugars at different times of the day -  4x a day, rotating check times - advised for yearly eye exams >> he is UTD - will check an ACR today - return to clinic in 4 months  2. HL -Reviewed latest lipid panel from 12/2022: LDL improved, close to goal, triglycerides high, HDL low: Lab Results  Component Value Date   CHOL 134 12/22/2022   HDL 27.50 (L) 12/22/2022   LDLCALC 93 06/20/2022   LDLDIRECT 59.0 12/22/2022   TRIG 281.0 (H) 12/22/2022   CHOLHDL 5 12/22/2022  -He  continues pravastatin 40 mg daily without side effects  Carlus Pavlov, MD PhD [facility]

## 2023-06-05 NOTE — Patient Instructions (Addendum)
Please continue: - Metformin 500 mg 1-2x a day - Jardiance 25 mg before b'fast  Try to increase: - Ozempic 1 mg weekly  Please return in 4 months.

## 2023-06-06 ENCOUNTER — Encounter: Payer: Self-pay | Admitting: Internal Medicine

## 2023-06-06 LAB — MICROALBUMIN / CREATININE URINE RATIO
Creatinine, Urine: 56 mg/dL (ref 20–320)
Microalb Creat Ratio: 9 mg/g{creat} (ref <30)
Microalb, Ur: 0.5 mg/dL

## 2023-06-18 ENCOUNTER — Other Ambulatory Visit: Payer: Self-pay | Admitting: Cardiology

## 2023-06-18 NOTE — Telephone Encounter (Signed)
 Rx refill sent to pharmacy.

## 2023-06-25 ENCOUNTER — Encounter: Payer: Medicaid Other | Attending: Physical Medicine & Rehabilitation | Admitting: Physical Medicine & Rehabilitation

## 2023-06-25 ENCOUNTER — Encounter: Payer: Self-pay | Admitting: Physical Medicine & Rehabilitation

## 2023-06-25 VITALS — BP 128/77 | HR 89 | Ht 69.0 in | Wt 179.0 lb

## 2023-06-25 DIAGNOSIS — G8929 Other chronic pain: Secondary | ICD-10-CM | POA: Diagnosis present

## 2023-06-25 DIAGNOSIS — I639 Cerebral infarction, unspecified: Secondary | ICD-10-CM | POA: Insufficient documentation

## 2023-06-25 DIAGNOSIS — E1142 Type 2 diabetes mellitus with diabetic polyneuropathy: Secondary | ICD-10-CM | POA: Insufficient documentation

## 2023-06-25 DIAGNOSIS — G894 Chronic pain syndrome: Secondary | ICD-10-CM | POA: Diagnosis not present

## 2023-06-25 DIAGNOSIS — Z5181 Encounter for therapeutic drug level monitoring: Secondary | ICD-10-CM | POA: Diagnosis not present

## 2023-06-25 DIAGNOSIS — M545 Low back pain, unspecified: Secondary | ICD-10-CM | POA: Diagnosis not present

## 2023-06-25 DIAGNOSIS — Z794 Long term (current) use of insulin: Secondary | ICD-10-CM | POA: Diagnosis not present

## 2023-06-25 DIAGNOSIS — M797 Fibromyalgia: Secondary | ICD-10-CM | POA: Diagnosis not present

## 2023-06-25 MED ORDER — PREGABALIN 75 MG PO CAPS: 75.0000 mg | ORAL_CAPSULE | Freq: Two times a day (BID) | ORAL | 5 refills | Status: DC

## 2023-06-25 MED ORDER — HYDROCODONE-ACETAMINOPHEN 7.5-325 MG/15ML PO SOLN: 10.0000 mL | Freq: Two times a day (BID) | ORAL | 0 refills | Status: DC | PRN

## 2023-06-25 NOTE — Progress Notes (Signed)
 Subjective:    Patient ID: Chase [name], male    DOB: 1960/09/16, 63 y.o.   MRN: [id number]  HPI [name] is a 63 y.o. 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) (03/12/2018), Cerebrovascular accident, old (11/16/2014), Chest discomfort (06/08/2019), Chronic back pain (11/28/2020), Chronic cough (08/27/2014), Chronic left shoulder pain (08/06/2020), Chronic pain syndrome (11/23/2019), Chronic pansinusitis (06/30/2018), Chronic prostatitis (12/06/2013), 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), Constipation, 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), DDD (degenerative disc disease), cervical (04/26/2019), DDD (degenerative disc disease), lumbar (04/26/2019), Decreased hearing of both ears (02/12/2013), Deviated septum (05/31/2018), Diabetes mellitus without complication (HCC), Difficulty urinating, DM (diabetes mellitus) type II uncontrolled, periph vascular disorder (01/02/2016), Dry skin (05/09/2019), Dysuria (11/29/2010), Essential (primary) hypertension (01/26/2015), Excessive daytime sleepiness (02/11/2018), FATIGUE (12/09/2007), Fibromyalgia (03/02/2017), Food intolerance (05/09/2019), GERD (12/09/2007), GERD (gastroesophageal reflux  disease), GI bleed (02/10/2015), Headache(784.0), Hearing loss, 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/12/2013), Hemorrhoid (05/22/2011), HTN (hypertension) (02/04/2010), Hyperlipidemia, Hyperlipidemia associated with type 2 diabetes mellitus (HCC) (11/22/2020), Hyperlipidemia LDL goal <70 (01/26/2015), Hypersomnia, persistent (11/05/2012), Hypertension, Hypokalemia (08/29/2020), Hypotension (07/04/2020), 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), 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), Increasing residual urine (03/10/2022), Insect bite (03/16/2018), Insomnia (08/05/2011), Internal and external hemorrhoids without complication (10/04/2012), Internal hemorrhoids (10/22/2014), 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), Leg swelling, Localized swelling of both lower legs (03/02/2017), Low back pain (07/20/2007), Lower extremity edema (07/04/2020), Lower GI bleed, Melena (12/16/2021), Mixed conductive and sensorineural hearing loss of right ear with restricted hearing of left ear (10/16/2020), Nasal congestion, Neck pain (03/15/2020), NEPHROLITHIASIS (08/13/2006), Nocturia more than twice per night (02/11/2018), OSA on CPAP (10/17/2014), Otalgia of both ears (06/06/2021), Other chronic pain (03/02/2017), Other fatigue (07/04/2020), Pain in finger of right hand (10/06/2012), Pain in joint of right shoulder (02/21/2017), Palpitations (01/26/2015), Paresthesia (07/25/2010), Persistent atrial fibrillation (HCC) (08/29/2020), Persistent headaches (10/17/2014), 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), PONV (postoperative nausea and vomiting), Preoperative clearance (07/04/2020), Prolapsed hemorrhoids (10/05/2013), Radiculopathy, lumbar region (03/15/2020), Rash (05/24/2019), Rash and other nonspecific skin eruption (07/23/2022), Raynaud's disease (03/02/2017), Raynaud's phenomenon (03/02/2017), Rectal bleeding (02/12/2015), Rectal pain, Rectal ulcer with bleeding after hemorrhoid banding (02/11/2015), Rhinitis, chronic  (12/23/2006), Right shoulder pain (02/15/2015), Seasonal and perennial allergic rhinitis (05/09/2019), Severe episode of recurrent major depressive disorder, without psychotic features (HCC) (08/29/2020), Sinus infection (06/30/2014), Sinusitis, acute, maxillary (02/02/2014), Sleep apnea, Sleep apnea with use of continuous positive airway pressure (CPAP) (02/11/2018), SOB (shortness of breath) (07/04/2020), Stroke (HCC), Stroke (HCC), Subacute confusional state (10/17/2014), SYMPTOM, HYPERSOMNIA NOS (02/19/2007), Thrombosed hemorrhoids, Tick bite of back (11/23/2019), Tinnitus of both ears (08/29/2020), Trouble swallowing, Unspecified hemorrhoids (02/10/2015), Unspecified sleep apnea (11/05/2012), UTI (urinary tract infection) (05/19/2022), Varicose veins of lower extremities with other complications (08/10/2012), Vasomotor rhinitis (06/28/2021), and Vertigo (03/19/2012).   They are presenting to PM&R clinic as a new patient for pain management evaluation. They were referred by Dr. Antonio Meth for treatment of chronic pain.    [name] reports he has had chronic pain for about 10  years.  His speech is limited due to history of CVAs with aphasia.  Patient has neck pain with pain affecting his bilateral arms.  He also has lower back pain and pain in his legs.  He also has burning, pins-and-needles and tingling pain in his feet, suspected to be due to diabetes mellitus.  Walking and standing worsens his pain.  Pending backwards is also painful.  Patient indicates he has pain throughout his entire body.  He reports he was previously followed by Dr. Ernesto for his pain.  Patient reports he had injections in his back or neck and is unsure if this is beneficial.  He indicates he was later followed by other doctors that were prescribing him medications.  He has trouble swallowing pills so is taking liquid hydrocodone .  He says this medication is helping keep his pain under control.  He also takes gabapentin  100 mg 3 times  daily which she feels like is beneficial to the pain in his feet however he does not keep it controlled.   Chart review indicates he was previously diagnosed with fibromyalgia.   He reports history of Raynaud's phenomena   Patient is a poor historian and limitations due to aphasia         Red flag symptoms: No red flags for back pain endorsed in Hx or ROS   Medications tried:   Nsaids - limited by comorbidities  Tylenol  - helps Opiates  hydrocodone   Gabapentin - TID 100mg ,helping  TCAs  -denies, doesn't recall  SNRIs  - effexor  helps with mood      Other treatments: PT- helped in the past  Chiropractor TENs unit - Injections - back or neck injections not sure if this helped  Surgery- denies    Goals for pain control: Decrease overall body wide pain       Interval History 02/26/23 Mr. Herberg is here for follow-up regarding his back and neck pain, pain related to peripheral neuropathy, fibromyalgia.  He reports that pain is largely unchanged from last visit.  He has not consistently tried taking gabapentin  200 mg 3 times daily and has mostly been taking it at the 100 mg dose.  He has been having more pain in his left great toe, being treated with antibiotics by his PCP.  He has been walking with physical therapy and feels like this has been helping his balance and strength.  Physical therapy has also had a little benefit to his pain.  He reports he forgot to get records sent over from his prior pain clinic.  He says he stopped going there because they started doing more procedure treatments.  He says they referred him to a different provider but he did not follow-up on this.  He reports his pain was better controlled when he was taking the hydrocodone  liquid.  He was able to walk a lot farther and do a lot more at home.   Interval History 03/26/23 Patient is here for follow-up regarding his chronic pain.  Pain is greatest in his neck, back, legs.  Patient does think that Lyrica  has  been beneficial, not having any side effects at this time.  He continues to be frustrated by his aphasia, limiting his ability to communicate.  Interval History 04/23/2023 Mr. Koren is here for follow-up regarding his chronic body wide pain.  Pain is the worst in his neck, lower back and legs.  Patient does feel that Lyrica  is reducing his pain, however not keeping it under good control.  Patient  had insurance issues with the pharmacy and was unable to pick up his hydrocodone  liquid, all of this appears to be resolved and pharmacy is ordering the medication for pickup.  Mood continues to be decreased and he feels depressed at times.  No SI or HI.  Interval History 06/25/23 Patient continues to have severe pain in his back and neck.  He also has milder pain in his arms and legs.  Patient reports benefit with Lyrica , feels like it is helping to reduce the pain in his legs. Patient reports liquid hydrocodone  liquid 10 mL providing mild benefit to his pain.patient. Reports it worked much better in the past when he used 15 ml dose.   Patient is difficult historian due to expressive aphasia.  Fotgot to bring hycet  Lyrica  heping  Pain Inventory Average Pain 5 Pain Right Now 7 My pain is intermittent, constant, sharp, burning, dull, stabbing, tingling, and aching  In the last 24 hours, has pain interfered with the following? General activity 7 Relation with others 7 Enjoyment of life 7 What TIME of day is your pain at its worst? morning , daytime, evening, and night Sleep (in general) Fair  Pain is worse with: walking, bending, sitting, standing, and some activites Pain improves with: rest, heat/ice, therapy/exercise, medication, and heat Relief from Meds: 6  Family History  Problem Relation Age of Onset   Breast cancer Mother 16       breast   Huntington's disease Mother    Stroke Father    Heart disease Father    Hypertension Father    Heart attack Father    Hypertension Brother     Hyperlipidemia Neg Hx    Diabetes Neg Hx    Social History   Socioeconomic History   Marital status: Single    Spouse name: Not on file   Number of children: 0   Years of education: Not on file   Highest education level: 12th grade  Occupational History   Not on file  Tobacco Use   Smoking status: Never   Smokeless tobacco: Never  Vaping Use   Vaping status: Never Used  Substance and Sexual Activity   Alcohol  use: No    Alcohol /week: 0.0 standard drinks of alcohol     Comment: none   Drug use: No   Sexual activity: Never  Other Topics Concern   Not on file  Social History Narrative   No caffeine intake except for chocolate.  Exercised-less walking    Social Drivers of Health   Financial Resource Strain: Medium Risk (02/17/2023)   Overall Financial Resource Strain (CARDIA)    Difficulty of Paying Living Expenses: Somewhat hard  Food Insecurity: Food Insecurity Present (02/17/2023)   Hunger Vital Sign    Worried About Running Out of Food in the Last Year: Sometimes true    Ran Out of Food in the Last Year: Patient declined  Transportation Needs: Patient Declined (02/17/2023)   PRAPARE - Administrator, Civil Service (Medical): Patient declined    Lack of Transportation (Non-Medical): Patient declined  Physical Activity: Unknown (02/17/2023)   Exercise Vital Sign    Days of Exercise per Week: Patient declined    Minutes of Exercise per Session: Not on file  Stress: Stress Concern Present (02/17/2023)   [employer] of Occupational Health - Occupational Stress Questionnaire    Feeling of Stress : To some extent  Social Connections: Unknown (02/17/2023)   Social Connection and Isolation Panel [NHANES]    Frequency  of Communication with Friends and Family: Patient declined    Frequency of Social Gatherings with Friends and Family: Patient declined    Attends Religious Services: Patient declined    Database Administrator or Organizations: Patient declined    Attends  Engineer, Structural: Not on file    Marital Status: Patient declined   Past Surgical History:  Procedure Laterality Date   CATARACT EXTRACTION     x 3   EYE SURGERY  1968, 1985, 1987   cataracts   FLEXIBLE SIGMOIDOSCOPY N/A 02/11/2015   Procedure: FLEXIBLE SIGMOIDOSCOPY;  Surgeon: Lupita FORBES Commander, MD;  Location: [facility];  Service: Endoscopy;  Laterality: N/A;   INNER EAR SURGERY     rt ear   INNER EAR SURGERY  1992   surgery - right arm     1983   Past Surgical History:  Procedure Laterality Date   CATARACT EXTRACTION     x 3   EYE SURGERY  1968, 1985, 1987   cataracts   FLEXIBLE SIGMOIDOSCOPY N/A 02/11/2015   Procedure: FLEXIBLE SIGMOIDOSCOPY;  Surgeon: Lupita FORBES Commander, MD;  Location: [facility];  Service: Endoscopy;  Laterality: N/A;   INNER EAR SURGERY     rt ear   INNER EAR SURGERY  1992   surgery - right arm     1983   Past Medical History:  Diagnosis Date   Abnormality of gait 11/10/2012   Acute GI bleeding    Acute renal insufficiency 02/10/2015   Acute upper respiratory infection 08/10/2012   Allergic drug reaction 05/24/2020   Allergic rhinitis 10/16/2020   Allergy    ANXIETY DEPRESSION 12/09/2007   Qualifier: Diagnosis of  By: Charlsie RN, Angeline MATSU    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) 03/12/2018   Cerebrovascular accident, old 11/16/2014   Chest discomfort 06/08/2019   Chronic back pain 11/28/2020   Chronic cough 08/27/2014   CXR 08/2014:  Mild hyperinflation, no acute process Spiro 09/2014:  Difficulty with procedure, but  essentially normal +CVA's in past, with aspiration risk by history Speech evaluation 2016:      Chronic left shoulder pain 08/06/2020   Chronic pain syndrome 11/23/2019   Chronic pansinusitis 06/30/2018   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   Constipation    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   DDD (degenerative disc disease), cervical 04/26/2019   DDD (degenerative disc disease), lumbar 04/26/2019   Decreased hearing of both ears 02/12/2013   Deviated septum 05/31/2018   Diabetes mellitus without complication (HCC)    Difficulty urinating    DM (diabetes mellitus) type II uncontrolled, periph vascular disorder 01/02/2016   Dry skin 05/09/2019   Dysuria 11/29/2010   Essential (primary) hypertension 01/26/2015   Excessive daytime sleepiness 02/11/2018   FATIGUE 12/09/2007   Qualifier: Diagnosis of  By: Charlsie OBIE Angeline MATSU    Fibromyalgia 03/02/2017   Food intolerance 05/09/2019   GERD 12/09/2007   Qualifier: Diagnosis of  By: Viviann Raddle MD, Marsha SAUNDERS  GERD (gastroesophageal reflux disease)    GI bleed 02/10/2015   Headache 12/23/2006   Qualifier: Diagnosis of   By: Charlsie OBIE Angeline KANDICE     IMO SNOMED Dx Update Oct 2024     Hearing loss    Hemangioma 02/12/2013   Hemorrhoid 05/22/2011   HTN (hypertension) 02/04/2010   Qualifier: Diagnosis of  By: Domenica MD, Stacey     Hyperlipidemia    Hyperlipidemia associated with type 2 diabetes mellitus (HCC) 11/22/2020   Hyperlipidemia LDL goal <70 01/26/2015   Hypersomnia, persistent 11/05/2012   epworth of 18 points, was placed on CPAP in 2008 with very low AHI ( no number  Mentioned) and struggles ever since with PAP. 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   Leg swelling    Localized swelling of both lower legs 03/02/2017   Low back pain 07/20/2007   Qualifier: Diagnosis of  By: Viviann Raddle MD, Marsha SAUNDERS    Lower extremity edema 07/04/2020   Lower GI bleed    Melena 12/16/2021   Mixed conductive and sensorineural hearing loss of right ear with restricted hearing of left ear 10/16/2020   Nasal congestion    Neck pain 03/15/2020   NEPHROLITHIASIS 08/13/2006   Qualifier: Diagnosis of  By: Geralene Service     Nocturia more than twice per night 02/11/2018   OSA on CPAP 10/17/2014   Otalgia of both ears 06/06/2021   Other chronic pain 03/02/2017   Other fatigue 07/04/2020   Pain in finger of right hand 10/06/2012   Pain in joint of right shoulder 02/21/2017   Palpitations 01/26/2015   Paresthesia 07/25/2010   Persistent atrial fibrillation (HCC) 08/29/2020   Persistent headaches 10/17/2014   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   Qualifier: Diagnosis of  By: Viviann Raddle MD, Marsha R    PONV (postoperative nausea and vomiting)    Preoperative clearance 07/04/2020   Preventative health care 11/26/2011   Prolapsed hemorrhoids 10/05/2013   Radiculopathy, lumbar region 03/15/2020   Rash 05/24/2019   Rash and other nonspecific skin eruption 07/23/2022   Raynaud's disease 03/02/2017   Raynaud's phenomenon 03/02/2017   Rectal bleeding 02/12/2015   Rectal pain    Rectal ulcer with bleeding after hemorrhoid banding 02/11/2015   Rhinitis, chronic 12/23/2006   Qualifier: Diagnosis of  By: Charlsie RN, Regina G    Right shoulder pain 02/15/2015   Seasonal and perennial allergic rhinitis 05/09/2019   Severe episode of recurrent major depressive disorder, 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   Sinus infection 06/30/2014   Sinusitis, acute, maxillary 02/02/2014   Sleep apnea    Sleep apnea with use of continuous  positive airway pressure (CPAP) 02/11/2018   SOB (shortness of breath) 07/04/2020   Stroke (HCC)    Stroke (HCC)    Subacute confusional state 10/17/2014   SYMPTOM, HYPERSOMNIA NOS 02/19/2007   Qualifier: Diagnosis of  By: Winona Raring     Thrombosed hemorrhoids    Tick bite of back 11/23/2019   Tinnitus of both ears 08/29/2020   Trouble swallowing    Unspecified hemorrhoids 02/10/2015   UTI (urinary tract infection) 05/19/2022   Varicose veins  of bilateral lower extremities with other complications 08/10/2012   IMO SNOMED Dx Update Oct 2024     Vasomotor rhinitis 06/28/2021   Vertigo 03/19/2012   BP 128/77   Pulse 89   Ht 5' 9 (1.753 m)   Wt 179 lb (81.2 kg)   SpO2 97%   BMI 26.43 kg/m   Opioid Risk Score:   Fall Risk Score:  `1  Depression screen PHQ 2/9     06/25/2023    2:54 PM 04/23/2023    3:16 PM 03/26/2023    3:11 PM 01/15/2023    3:06 PM 12/22/2022    2:24 PM 06/20/2022    3:08 PM 12/16/2021   10:41 AM  Depression screen PHQ 2/9  Decreased Interest  2 0 2 2 0 0  Down, Depressed, Hopeless  2 0 2 3 0 0  PHQ - 2 Score  4 0 4 5 0 0  Altered sleeping 2   3 2  0 0  Tired, decreased energy 2   2 2  0 0  Change in appetite    2 1 0 0  Feeling bad or failure about yourself     2 0 0 0  Trouble concentrating    2 1 0 0  Moving slowly or fidgety/restless    3 1 0 0  Suicidal thoughts    0 0 0 0  PHQ-9 Score    18 12 0 0  Difficult doing work/chores    Somewhat difficult Very difficult Not difficult at all Not difficult at all      Review of Systems  Constitutional: Negative.   HENT:  Positive for voice change.   Eyes:  Positive for visual disturbance.  Respiratory: Negative.    Cardiovascular: Negative.   Gastrointestinal: Negative.   Endocrine: Negative.   Genitourinary: Negative.   Musculoskeletal:  Positive for back pain, gait problem and neck pain. Negative for neck stiffness.       Pain in both shoulders, Feet, legs, arms  Skin: Negative.    Allergic/Immunologic: Negative.   Hematological: Negative.   Psychiatric/Behavioral:  Positive for dysphoric mood.   All other systems reviewed and are negative.      Objective:   Physical Exam  Gen: no distress, normal appearing HEENT: oral mucosa pink and moist, NCAT Chest: normal effort, normal rate of breathing Abd: soft, non-distended Ext: no edema Psych: Frustrated with his language deficits skin: Warm and dry Neuro: Alert and awake, follows commands, cranial nerves II through XII grossly intact Patient has significant aphasia, primarily expressive Sensory exam normal for light touch in all 4 limbs.    Musculoskeletal:  Kyphotic posture Pain with spinal extension and facet loading-unchanged 06/25/2023 Patient with diffuse tenderness to palpation throughout bilateral upper and lower extremities-unchanged Paraspinal tenderness greatest at lumbar and thoracic spine Slump test negative bilaterally  Lumbar spine MRI 06/05/2021 IMPRESSION: 1. No significant lumbar spine disc protrusion, foraminal stenosis or central canal stenosis. 2. 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 Differential considerations include a nerve sheath tumor versus myxopapillary ependymoma. Metastatic disease is considered less likely given the lack of significant interval change.     Cervical spine MRI 01/09/2020 IMPRESSION: 1. C4-5 mild degenerative spinal stenosis which has improved from 2013 due to interval regression of a central herniation. 2. C5-6 advanced right foraminal stenosis. Mild to moderate foraminal narrowing bilaterally at C4-5 and on the left at C5-6.  Assessment & Plan:    1) Fibromyalgia -Overall his exam appears to be most consistent with fibromyalgia, has very diffuse tenderness and pain 2)  Peripheral polyneuropathy likely due to diabetes mellitus type 3) chronic  lower back pain and chronic neck pain. 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  Denies SI or HI 5) History of CVA.  Patient continues to have aphasia   Plan 1) Dc Gabapentin  previously, increase lyrica  to 75mg  BID 2) Previously on liquid hydrocodone , Adjust to Hycet 7.5/15ml, 10-15ml Q12h PRN.  Medication ordered.  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   I called prior clinic and requested for records to be faxed over, consider repeating this  5) Patient worked with physical therapy in 2024  6) Continue venlafaxine  XR 150 mg daily.  Hopefully this can help his pain and mood 7) continue Flexeril  as needed 8) consider Qutenza if polyneuropathy pain does not improve with oral medications 9) consider repeat L spine MRI 10) continue monitor UDS, PDMP, pill counts 11) Patient forgot to bring his pain medication, warning today verbal and will ask for written to be send.  It has been almost 2 months since his last refill and he was only ordered the 23 day supply.

## 2023-06-26 ENCOUNTER — Telehealth: Payer: Self-pay | Admitting: *Deleted

## 2023-06-26 NOTE — Telephone Encounter (Signed)
Warning letter sent through MyChart.

## 2023-06-26 NOTE — Telephone Encounter (Deleted)
-----   Message from Fanny Dance sent at 06/25/2023  8:15 PM EST ----- Regarding: Pill count Hey, can we please send he a letter reminding him to be bring his liquid hydrocodone to each visit. Thanks

## 2023-06-26 NOTE — Telephone Encounter (Signed)
-----   Message from Fanny Dance sent at 06/25/2023  8:15 PM EST ----- Regarding: Pill count Hey, can we please send he a letter reminding him to be bring his liquid hydrocodone to each visit. Thanks

## 2023-07-02 ENCOUNTER — Other Ambulatory Visit: Payer: Self-pay | Admitting: Family Medicine

## 2023-07-15 ENCOUNTER — Other Ambulatory Visit: Payer: Self-pay | Admitting: Cardiology

## 2023-07-23 ENCOUNTER — Encounter: Payer: Self-pay | Admitting: Physical Medicine & Rehabilitation

## 2023-07-23 ENCOUNTER — Encounter: Payer: Medicaid Other | Attending: Physical Medicine & Rehabilitation | Admitting: Physical Medicine & Rehabilitation

## 2023-07-23 VITALS — BP 139/81 | HR 88 | Wt 174.0 lb

## 2023-07-23 DIAGNOSIS — M545 Low back pain, unspecified: Secondary | ICD-10-CM | POA: Diagnosis not present

## 2023-07-23 DIAGNOSIS — Z5181 Encounter for therapeutic drug level monitoring: Secondary | ICD-10-CM | POA: Insufficient documentation

## 2023-07-23 DIAGNOSIS — G894 Chronic pain syndrome: Secondary | ICD-10-CM | POA: Diagnosis not present

## 2023-07-23 DIAGNOSIS — M797 Fibromyalgia: Secondary | ICD-10-CM | POA: Diagnosis not present

## 2023-07-23 DIAGNOSIS — E1142 Type 2 diabetes mellitus with diabetic polyneuropathy: Secondary | ICD-10-CM | POA: Diagnosis not present

## 2023-07-23 DIAGNOSIS — F341 Dysthymic disorder: Secondary | ICD-10-CM | POA: Diagnosis not present

## 2023-07-23 DIAGNOSIS — Z794 Long term (current) use of insulin: Secondary | ICD-10-CM | POA: Diagnosis not present

## 2023-07-23 DIAGNOSIS — Z79899 Other long term (current) drug therapy: Secondary | ICD-10-CM | POA: Diagnosis not present

## 2023-07-23 DIAGNOSIS — G8929 Other chronic pain: Secondary | ICD-10-CM | POA: Diagnosis present

## 2023-07-23 MED ORDER — HYDROCODONE-ACETAMINOPHEN 7.5-325 MG/15ML PO SOLN: 10.0000 mL | Freq: Two times a day (BID) | ORAL | 0 refills | Status: DC | PRN

## 2023-07-23 NOTE — Progress Notes (Signed)
 Subjective:    Patient ID: Chase [name], male    DOB: 1960/06/18, 63 y.o.   MRN: [id number]  HPI Chase [name] is a 64 y.o. 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) (03/12/2018), Cerebrovascular accident, old (11/16/2014), Chest discomfort (06/08/2019), Chronic back pain (11/28/2020), Chronic cough (08/27/2014), Chronic left shoulder pain (08/06/2020), Chronic pain syndrome (11/23/2019), Chronic pansinusitis (06/30/2018), Chronic prostatitis (12/06/2013), 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), Constipation, 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), DDD (degenerative disc disease), cervical (04/26/2019), DDD (degenerative disc disease), lumbar (04/26/2019), Decreased hearing of both ears (02/12/2013), Deviated septum (05/31/2018), Diabetes mellitus without complication (HCC), Difficulty urinating, DM (diabetes mellitus) type II uncontrolled, periph vascular disorder (01/02/2016), Dry skin (05/09/2019), Dysuria (11/29/2010), Essential (primary) hypertension (01/26/2015), Excessive daytime sleepiness (02/11/2018), FATIGUE (12/09/2007), Fibromyalgia (03/02/2017), Food intolerance (05/09/2019), GERD (12/09/2007), GERD (gastroesophageal reflux  disease), GI bleed (02/10/2015), Headache(784.0), Hearing loss, 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/12/2013), Hemorrhoid (05/22/2011), HTN (hypertension) (02/04/2010), Hyperlipidemia, Hyperlipidemia associated with type 2 diabetes mellitus (HCC) (11/22/2020), Hyperlipidemia LDL goal <70 (01/26/2015), Hypersomnia, persistent (11/05/2012), Hypertension, Hypokalemia (08/29/2020), Hypotension (07/04/2020), 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), 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), Increasing residual urine (03/10/2022), Insect bite (03/16/2018), Insomnia (08/05/2011), Internal and external hemorrhoids without complication (10/04/2012), Internal hemorrhoids (10/22/2014), 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), Leg swelling, Localized swelling of both lower legs (03/02/2017), Low back pain (07/20/2007), Lower extremity edema (07/04/2020), Lower GI bleed, Melena (12/16/2021), Mixed conductive and sensorineural hearing loss of right ear with restricted hearing of left ear (10/16/2020), Nasal congestion, Neck pain (03/15/2020), NEPHROLITHIASIS (08/13/2006), Nocturia more than twice per night (02/11/2018), OSA on CPAP (10/17/2014), Otalgia of both ears (06/06/2021), Other chronic pain (03/02/2017), Other fatigue (07/04/2020), Pain in finger of right hand (10/06/2012), Pain in joint of right shoulder (02/21/2017), Palpitations (01/26/2015), Paresthesia (07/25/2010), Persistent atrial fibrillation (HCC) (08/29/2020), Persistent headaches (10/17/2014), 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), PONV (postoperative nausea and vomiting), Preoperative clearance (07/04/2020), Prolapsed hemorrhoids (10/05/2013), Radiculopathy, lumbar region (03/15/2020), Rash (05/24/2019), Rash and other nonspecific skin eruption (07/23/2022), Raynaud's disease (03/02/2017), Raynaud's phenomenon (03/02/2017), Rectal bleeding (02/12/2015), Rectal pain, Rectal ulcer with bleeding after hemorrhoid banding (02/11/2015), Rhinitis, chronic  (12/23/2006), Right shoulder pain (02/15/2015), Seasonal and perennial allergic rhinitis (05/09/2019), Severe episode of recurrent major depressive disorder, without psychotic features (HCC) (08/29/2020), Sinus infection (06/30/2014), Sinusitis, acute, maxillary (02/02/2014), Sleep apnea, Sleep apnea with use of continuous positive airway pressure (CPAP) (02/11/2018), SOB (shortness of breath) (07/04/2020), Stroke (HCC), Stroke (HCC), Subacute confusional state (10/17/2014), SYMPTOM, HYPERSOMNIA NOS (02/19/2007), Thrombosed hemorrhoids, Tick bite of back (11/23/2019), Tinnitus of both ears (08/29/2020), Trouble swallowing, Unspecified hemorrhoids (02/10/2015), Unspecified sleep apnea (11/05/2012), UTI (urinary tract infection) (05/19/2022), Varicose veins of lower extremities with other complications (08/10/2012), Vasomotor rhinitis (06/28/2021), and Vertigo (03/19/2012).   They are presenting to PM&R clinic as a new patient for pain management evaluation. They were referred by Dr. Antonio Meth for treatment of chronic pain.    Chase [name] reports he has had chronic pain for about 10  years.  His speech is limited due to history of CVAs with aphasia.  Patient has neck pain with pain affecting his bilateral arms.  He also has lower back pain and pain in his legs.  He also has burning, pins-and-needles and tingling pain in his feet, suspected to be due to diabetes mellitus.  Walking and standing worsens his pain.  Pending backwards is also painful.  Patient indicates he has pain throughout his entire body.  He reports he was previously followed by Dr. Ernesto for his pain.  Patient reports he had injections in his back or neck and is unsure if this is beneficial.  He indicates he was later followed by other doctors that were prescribing him medications.  He has trouble swallowing pills so is taking liquid hydrocodone .  He says this medication is helping keep his pain under control.  He also takes gabapentin  100 mg 3 times  daily which she feels like is beneficial to the pain in his feet however he does not keep it controlled.   Chart review indicates he was previously diagnosed with fibromyalgia.   He reports history of Raynaud's phenomena   Patient is a poor historian and limitations due to aphasia         Red flag symptoms: No red flags for back pain endorsed in Hx or ROS   Medications tried:   Nsaids - limited by comorbidities  Tylenol  - helps Opiates  hydrocodone   Gabapentin - TID 100mg ,helping  TCAs  -denies, doesn't recall  SNRIs  - effexor  helps with mood      Other treatments: PT- helped in the past  Chiropractor TENs unit - Injections - back or neck injections not sure if this helped  Surgery- denies    Goals for pain control: Decrease overall body wide pain       Interval History 02/26/23 Chase [name] is here for follow-up regarding his back and neck pain, pain related to peripheral neuropathy, fibromyalgia.  He reports that pain is largely unchanged from last visit.  He has not consistently tried taking gabapentin  200 mg 3 times daily and has mostly been taking it at the 100 mg dose.  He has been having more pain in his left great toe, being treated with antibiotics by his PCP.  He has been walking with physical therapy and feels like this has been helping his balance and strength.  Physical therapy has also had a little benefit to his pain.  He reports he forgot to get records sent over from his prior pain clinic.  He says he stopped going there because they started doing more procedure treatments.  He says they referred him to a different provider but he did not follow-up on this.  He reports his pain was better controlled when he was taking the hydrocodone  liquid.  He was able to walk a lot farther and do a lot more at home.   Interval History 03/26/23 Patient is here for follow-up regarding his chronic pain.  Pain is greatest in his neck, back, legs.  Patient does think that Lyrica  has  been beneficial, not having any side effects at this time.  He continues to be frustrated by his aphasia, limiting his ability to communicate.  Interval History 04/23/2023 Chase [name] is here for follow-up regarding his chronic body wide pain.  Pain is the worst in his neck, lower back and legs.  Patient does feel that Lyrica  is reducing his pain, however not keeping it under good control.  Patient  had insurance issues with the pharmacy and was unable to pick up his hydrocodone  liquid, all of this appears to be resolved and pharmacy is ordering the medication for pickup.  Mood continues to be decreased and he feels depressed at times.  No SI or HI.  Interval History 06/25/23 Patient continues to have severe pain in his back and neck.  He also has milder pain in his arms and legs.  Patient reports benefit with Lyrica , feels like it is helping to reduce the pain in his legs. Patient reports liquid hydrocodone  liquid 10 mL providing mild benefit to his pain.patient. Reports it worked much better in the past when he used 15 ml dose.   Patient is difficult historian due to expressive aphasia.  Interval History 07/22/2022 Patient reports continued severe pain throughout his whole back.  The next worst area of pain as throughout his legs.  He also has severe pain in his neck and shoulders.  Hydrocodone  liquid is helping, he is using it sparingly when pain is only very severe.  He continues to use Lyrica  75 mg twice daily.  Reports he is tolerating both of these medications.Occasional constipation controlled with laxatives.  Ontinues to have pins-and-needles pain in his feet.    Pain Inventory Average Pain 8 Pain Right Now 7 My pain is intermittent, constant, sharp, burning, dull, stabbing, tingling, and aching  In the last 24 hours, has pain interfered with the following? General activity 7 Relation with others 7 Enjoyment of life 7 What TIME of day is your pain at its worst? varies Sleep (in general)  Fair  Pain is worse with: walking, bending, sitting, standing, and some activites Pain improves with: rest, therapy/exercise, medication, and heat Relief from Meds: 7  Family History  Problem Relation Age of Onset   Breast cancer Mother 32       breast   Huntington's disease Mother    Stroke Father    Heart disease Father    Hypertension Father    Heart attack Father    Hypertension Brother    Hyperlipidemia Neg Hx    Diabetes Neg Hx    Social History   Socioeconomic History   Marital status: Single    Spouse name: Not on file   Number of children: 0   Years of education: Not on file   Highest education level: 12th grade  Occupational History   Not on file  Tobacco Use   Smoking status: Never   Smokeless tobacco: Never  Vaping Use   Vaping status: Never Used  Substance and Sexual Activity   Alcohol  use: No    Alcohol /week: 0.0 standard drinks of alcohol     Comment: none   Drug use: No   Sexual activity: Never  Other Topics Concern   Not on file  Social History Narrative   No caffeine intake except for chocolate.  Exercised-less walking    Social Drivers of Health   Financial Resource Strain: Medium Risk (02/17/2023)   Overall Financial Resource Strain (CARDIA)    Difficulty of Paying Living Expenses: Somewhat hard  Food Insecurity: Food Insecurity Present (02/17/2023)   Hunger Vital Sign    Worried About Running Out of Food in the Last Year: Sometimes true    Ran Out of Food in the Last Year: Patient declined  Transportation Needs: Patient Declined (02/17/2023)   PRAPARE - Administrator, Civil Service (Medical): Patient declined    Lack of Transportation (Non-Medical): Patient declined  Physical Activity: Unknown (  02/17/2023)   Exercise Vital Sign    Days of Exercise per Week: Patient declined    Minutes of Exercise per Session: Not on file  Stress: Stress Concern Present (02/17/2023)   [employer] of Occupational Health - Occupational Stress  Questionnaire    Feeling of Stress : To some extent  Social Connections: Unknown (02/17/2023)   Social Connection and Isolation Panel [NHANES]    Frequency of Communication with Friends and Family: Patient declined    Frequency of Social Gatherings with Friends and Family: Patient declined    Attends Religious Services: Patient declined    Database Administrator or Organizations: Patient declined    Attends Engineer, Structural: Not on file    Marital Status: Patient declined   Past Surgical History:  Procedure Laterality Date   CATARACT EXTRACTION     x 3   EYE SURGERY  1968, 1985, 1987   cataracts   FLEXIBLE SIGMOIDOSCOPY N/A 02/11/2015   Procedure: FLEXIBLE SIGMOIDOSCOPY;  Surgeon: Lupita FORBES Commander, MD;  Location: [facility];  Service: Endoscopy;  Laterality: N/A;   INNER EAR SURGERY     rt ear   INNER EAR SURGERY  1992   surgery - right arm     1983   Past Surgical History:  Procedure Laterality Date   CATARACT EXTRACTION     x 3   EYE SURGERY  1968, 1985, 1987   cataracts   FLEXIBLE SIGMOIDOSCOPY N/A 02/11/2015   Procedure: FLEXIBLE SIGMOIDOSCOPY;  Surgeon: Lupita FORBES Commander, MD;  Location: [facility];  Service: Endoscopy;  Laterality: N/A;   INNER EAR SURGERY     rt ear   INNER EAR SURGERY  1992   surgery - right arm     1983   Past Medical History:  Diagnosis Date   Abnormality of gait 11/10/2012   Acute GI bleeding    Acute renal insufficiency 02/10/2015   Acute upper respiratory infection 08/10/2012   Allergic drug reaction 05/24/2020   Allergic rhinitis 10/16/2020   Allergy    ANXIETY DEPRESSION 12/09/2007   Qualifier: Diagnosis of  By: Charlsie RN, Angeline MATSU    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) 03/12/2018   Cerebrovascular accident, old 11/16/2014   Chest discomfort 06/08/2019   Chronic back pain 11/28/2020   Chronic cough 08/27/2014   CXR 08/2014:  Mild hyperinflation, no acute process Spiro 09/2014:  Difficulty with procedure, but essentially normal +CVA's in past, with aspiration risk by history Speech evaluation 2016:      Chronic left shoulder pain 08/06/2020   Chronic pain syndrome 11/23/2019   Chronic pansinusitis 06/30/2018   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   Constipation    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   DDD (degenerative disc disease), cervical 04/26/2019   DDD (degenerative disc disease), lumbar 04/26/2019   Decreased hearing of both ears 02/12/2013   Deviated septum 05/31/2018   Diabetes mellitus without complication (HCC)    Difficulty urinating    DM (diabetes mellitus)  type II uncontrolled, periph vascular disorder 01/02/2016   Dry skin 05/09/2019   Dysuria 11/29/2010   Essential (primary) hypertension 01/26/2015   Excessive daytime sleepiness 02/11/2018   FATIGUE 12/09/2007   Qualifier: Diagnosis of  By: Charlsie RN, Angeline MATSU    Fibromyalgia 03/02/2017   Food intolerance 05/09/2019   GERD 12/09/2007   Qualifier: Diagnosis of  By: Viviann Raddle MD, Marsha SAUNDERS    GERD (gastroesophageal reflux disease)    GI bleed 02/10/2015   Headache 12/23/2006   Qualifier: Diagnosis of   By: Charlsie OBIE Angeline MATSU     IMO SNOMED Dx Update Oct 2024     Hearing loss    Hemangioma 02/12/2013   Hemorrhoid 05/22/2011   HTN (hypertension) 02/04/2010   Qualifier: Diagnosis of  By: Domenica MD, Stacey     Hyperlipidemia    Hyperlipidemia associated with type 2 diabetes mellitus (HCC) 11/22/2020   Hyperlipidemia LDL goal <70 01/26/2015   Hypersomnia, persistent 11/05/2012    epworth of 18 points, was placed on CPAP in 2008 with very low AHI ( no number  Mentioned) and struggles ever since with PAP. 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   Leg swelling    Localized swelling of both lower legs 03/02/2017   Low back pain 07/20/2007   Qualifier: Diagnosis of  By: Viviann Raddle MD, Marsha SAUNDERS    Lower extremity edema 07/04/2020   Lower GI bleed    Melena 12/16/2021   Mixed conductive and sensorineural hearing loss of right ear with restricted hearing of left ear 10/16/2020   Nasal congestion    Neck pain 03/15/2020   NEPHROLITHIASIS 08/13/2006   Qualifier: Diagnosis of  By: Geralene Service     Nocturia more than twice per night 02/11/2018   OSA on CPAP 10/17/2014   Otalgia of both ears 06/06/2021   Other chronic pain 03/02/2017   Other fatigue 07/04/2020   Pain in finger of right hand 10/06/2012   Pain in joint of right shoulder 02/21/2017   Palpitations 01/26/2015   Paresthesia 07/25/2010   Persistent atrial fibrillation (HCC) 08/29/2020   Persistent headaches 10/17/2014   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   Qualifier: Diagnosis of  By: Viviann Raddle MD, Marsha R    PONV (postoperative nausea and vomiting)    Preoperative clearance 07/04/2020   Preventative health care 11/26/2011   Prolapsed hemorrhoids 10/05/2013   Radiculopathy, lumbar region 03/15/2020   Rash 05/24/2019   Rash and other nonspecific skin eruption 07/23/2022   Raynaud's disease 03/02/2017   Raynaud's phenomenon 03/02/2017   Rectal bleeding 02/12/2015   Rectal pain    Rectal ulcer with bleeding after hemorrhoid banding  02/11/2015   Rhinitis, chronic 12/23/2006   Qualifier: Diagnosis of  By: Charlsie RN, Regina G    Right shoulder pain 02/15/2015   Seasonal and perennial allergic rhinitis 05/09/2019   Severe episode of recurrent major depressive disorder, 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   Sinus infection 06/30/2014   Sinusitis, acute, maxillary 02/02/2014   Sleep apnea    Sleep apnea with use of continuous positive airway pressure (CPAP) 02/11/2018   SOB (shortness of breath) 07/04/2020  Stroke [facility])    Stroke [facility])    Subacute confusional state 10/17/2014   SYMPTOM, HYPERSOMNIA NOS 02/19/2007   Qualifier: Diagnosis of  By: Winona Raring     Thrombosed hemorrhoids    Tick bite of back 11/23/2019   Tinnitus of both ears 08/29/2020   Trouble swallowing    Unspecified hemorrhoids 02/10/2015   UTI (urinary tract infection) 05/19/2022   Varicose veins of bilateral lower extremities with other complications 08/10/2012   IMO SNOMED Dx Update Oct 2024     Vasomotor rhinitis 06/28/2021   Vertigo 03/19/2012   BP 139/81   Pulse 88   Wt 174 lb (78.9 kg)   SpO2 98%   BMI 25.70 kg/m   Opioid Risk Score:   Fall Risk Score:  `1  Depression screen PHQ 2/9     07/23/2023    2:10 PM 06/25/2023    2:54 PM 04/23/2023    3:16 PM 03/26/2023    3:11 PM 01/15/2023    3:06 PM 12/22/2022    2:24 PM 06/20/2022    3:08 PM  Depression screen PHQ 2/9  Decreased Interest 1  2 0 2 2 0  Down, Depressed, Hopeless 1  2 0 2 3 0  PHQ - 2 Score 2  4 0 4 5 0  Altered sleeping  2   3 2  0  Tired, decreased energy  2   2 2  0  Change in appetite     2 1 0  Feeling bad or failure about yourself      2 0 0  Trouble concentrating     2 1 0  Moving slowly or fidgety/restless     3 1 0  Suicidal thoughts     0 0 0  PHQ-9 Score     18 12 0  Difficult doing work/chores     Somewhat difficult Very difficult Not difficult at all      Review of Systems  Constitutional: Negative.   HENT:  Negative for voice change.    Eyes:  Positive for visual disturbance.  Respiratory: Negative.    Cardiovascular: Negative.   Gastrointestinal: Negative.   Endocrine: Negative.   Genitourinary: Negative.   Musculoskeletal:  Positive for back pain, gait problem and neck pain. Negative for neck stiffness.       Pain in both shoulders, Feet, legs, arms  Skin: Negative.   Allergic/Immunologic: Negative.   Neurological:  Positive for headaches.  Hematological: Negative.   Psychiatric/Behavioral:  Positive for dysphoric mood.   All other systems reviewed and are negative.      Objective:   Physical Exam  Gen: no distress, normal appearing HEENT: oral mucosa pink and moist, NCAT Chest: normal effort, normal rate of breathing Abd: soft, non-distended Ext: no edema Psych: Frustrated with his language deficits skin: Warm and dry Neuro: Alert and awake, follows commands, cranial nerves II through XII grossly intact Patient has significant aphasia, primarily expressive Sensory exam normal for light touch in all 4 limbs-intact to light touch in both feet throughout   Musculoskeletal:  Kyphotic posture Pain with spinal extension and facet loading Patient with diffuse tenderness to palpation throughout bilateral upper and lower extremities Paraspinal tenderness greatest at lumbar and thoracic spine Slump test negative bilaterally  Lumbar spine MRI 06/05/2021 IMPRESSION: 1. No significant lumbar spine disc protrusion, foraminal stenosis or central canal stenosis. 2. 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 Differential considerations include  a nerve sheath tumor versus myxopapillary ependymoma. Metastatic disease is considered less likely given the lack of significant interval change.     Cervical spine MRI 01/09/2020 IMPRESSION: 1. C4-5 mild degenerative spinal stenosis which has improved from 2013 due to interval  regression of a central herniation. 2. C5-6 advanced right foraminal stenosis. Mild to moderate foraminal narrowing bilaterally at C4-5 and on the left at C5-6.          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  Denies SI or HI 5) History of CVA.  Patient continues to have aphasia   Plan 1) Dc Gabapentin  previously,Continue lyrica  to 75mg  BID 2) Previously on liquid hydrocodone , Continue Hycet 7.5/15ml, 10-15ml Q12h PRN.  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   I called prior clinic and requested for records to be faxed over, consider repeating this  5) Consult for Aquatic PT placed- [facility] 6) Continue venlafaxine  XR 150 mg daily 7) continue Flexeril  as needed 8) consider Qutenza if polyneuropathy pain does not improve with oral medications 9) consider repeat L spine MRI 10) continue monitor UDS, PDMP, pill counts- pt to bring bottles of medication to each visit

## 2023-07-29 ENCOUNTER — Other Ambulatory Visit: Payer: Self-pay | Admitting: Internal Medicine

## 2023-07-29 DIAGNOSIS — M797 Fibromyalgia: Secondary | ICD-10-CM | POA: Diagnosis not present

## 2023-07-29 DIAGNOSIS — E1165 Type 2 diabetes mellitus with hyperglycemia: Secondary | ICD-10-CM

## 2023-07-29 DIAGNOSIS — R269 Unspecified abnormalities of gait and mobility: Secondary | ICD-10-CM | POA: Diagnosis not present

## 2023-07-30 DIAGNOSIS — R269 Unspecified abnormalities of gait and mobility: Secondary | ICD-10-CM | POA: Diagnosis not present

## 2023-07-30 DIAGNOSIS — M797 Fibromyalgia: Secondary | ICD-10-CM | POA: Diagnosis not present

## 2023-07-31 ENCOUNTER — Telehealth: Payer: Self-pay

## 2023-07-31 DIAGNOSIS — N401 Enlarged prostate with lower urinary tract symptoms: Secondary | ICD-10-CM | POA: Diagnosis not present

## 2023-07-31 NOTE — Telephone Encounter (Signed)
Pt needs PA for [facility] 3 Plus

## 2023-08-01 ENCOUNTER — Other Ambulatory Visit: Payer: Self-pay | Admitting: Family Medicine

## 2023-08-03 ENCOUNTER — Telehealth: Payer: Self-pay

## 2023-08-03 ENCOUNTER — Other Ambulatory Visit (HOSPITAL_COMMUNITY): Payer: Self-pay

## 2023-08-03 NOTE — Telephone Encounter (Signed)
Pharmacy Patient Advocate Encounter   Received notification from Pt Calls Messages that prior authorization for Freestyle libre 3 plus is required/requested.   Insurance verification completed.   The patient is insured through [facility] .   Per test claim: PA required; PA submitted to above mentioned insurance via CoverMyMeds Key/confirmation #/EOC [id number] Status is pending

## 2023-08-03 NOTE — Telephone Encounter (Signed)
Pharmacy Patient Advocate Encounter   Received notification from Pt Calls Messages that prior authorization for Ozempic is required/requested.   Insurance verification completed.   The patient is insured through [facility] .   Per test claim: PA required; PA submitted to above mentioned insurance via CoverMyMeds Key/confirmation #/EOC [id number] Status is pending

## 2023-08-12 NOTE — Telephone Encounter (Signed)
 Pharmacy Patient Advocate Encounter  Received notification from [facility] that Prior Authorization for Ozempic has been APPROVED through 08/02/24   PA #/Case ID/Reference #: [id number]

## 2023-08-12 NOTE — Telephone Encounter (Signed)
 Pharmacy Patient Advocate Encounter  Received notification from [facility] that Prior Authorization for [facility] plus has been DENIED.  See denial reason below. No denial letter attached in CMM. Will attach denial letter to Media tab once received.    PA #/Case ID/Reference #: [id number]

## 2023-08-18 ENCOUNTER — Telehealth: Payer: Self-pay | Admitting: Pharmacist

## 2023-08-18 NOTE — Telephone Encounter (Signed)
 Appeal has been submitted. Will advise when response is received, please be advised that most companies may take 30 days to make a decision. Appeal letter and supporting documentation were faxed to [phone] on 08/18/2023 @9  am.  Thank you, Dellie Burns, PharmD Clinical Pharmacist  [place]  Direct Dial: [phone]

## 2023-08-18 NOTE — Telephone Encounter (Signed)
 error

## 2023-08-25 NOTE — Telephone Encounter (Signed)
 Additional information has been requested from the patient's insurance in order to proceed with the appeal request. Requested information has been sent, or form has been filled out and faxed back to [phone]

## 2023-08-26 ENCOUNTER — Other Ambulatory Visit: Payer: Self-pay | Admitting: Cardiology

## 2023-08-26 ENCOUNTER — Other Ambulatory Visit: Payer: Self-pay | Admitting: Family Medicine

## 2023-08-26 DIAGNOSIS — K649 Unspecified hemorrhoids: Secondary | ICD-10-CM

## 2023-08-26 NOTE — Telephone Encounter (Signed)
 Prescription sent to pharmacy.

## 2023-09-04 ENCOUNTER — Encounter: Payer: Self-pay | Admitting: Physical Medicine & Rehabilitation

## 2023-09-04 ENCOUNTER — Encounter: Payer: Medicaid Other | Attending: Physical Medicine & Rehabilitation | Admitting: Physical Medicine & Rehabilitation

## 2023-09-04 VITALS — BP 136/81 | HR 85 | Ht 69.0 in | Wt 173.6 lb

## 2023-09-04 DIAGNOSIS — M545 Low back pain, unspecified: Secondary | ICD-10-CM

## 2023-09-04 DIAGNOSIS — G894 Chronic pain syndrome: Secondary | ICD-10-CM

## 2023-09-04 DIAGNOSIS — Z79899 Other long term (current) drug therapy: Secondary | ICD-10-CM | POA: Diagnosis not present

## 2023-09-04 DIAGNOSIS — M797 Fibromyalgia: Secondary | ICD-10-CM

## 2023-09-04 DIAGNOSIS — G8929 Other chronic pain: Secondary | ICD-10-CM | POA: Insufficient documentation

## 2023-09-04 DIAGNOSIS — I639 Cerebral infarction, unspecified: Secondary | ICD-10-CM

## 2023-09-04 MED ORDER — PREGABALIN 100 MG PO CAPS: 100.0000 mg | ORAL_CAPSULE | Freq: Two times a day (BID) | ORAL | 4 refills | Status: DC

## 2023-09-04 MED ORDER — HYDROCODONE-ACETAMINOPHEN 7.5-325 MG/15ML PO SOLN: 10.0000 mL | Freq: Two times a day (BID) | ORAL | 0 refills | Status: DC | PRN

## 2023-09-04 NOTE — Patient Instructions (Signed)
 We are increasing lyrica dose to 100mg  BID We will restart Aquatic therapy MRI L spine was ordered- call us back if you dont hear anything in 2-3 weeks

## 2023-09-04 NOTE — Progress Notes (Signed)
 Subjective:    Patient ID: Chase [name], male    DOB: 1960/12/02, 63 y.o.   MRN: [id number]  HPI Chase [name] is a 63 y.o. 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) (03/12/2018), Cerebrovascular accident, old (11/16/2014), Chest discomfort (06/08/2019), Chronic back pain (11/28/2020), Chronic cough (08/27/2014), Chronic left shoulder pain (08/06/2020), Chronic pain syndrome (11/23/2019), Chronic pansinusitis (06/30/2018), Chronic prostatitis (12/06/2013), 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), Constipation, 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), DDD (degenerative disc disease), cervical (04/26/2019), DDD (degenerative disc disease), lumbar (04/26/2019), Decreased hearing of both ears (02/12/2013), Deviated septum (05/31/2018), Diabetes mellitus without complication (HCC), Difficulty urinating, DM (diabetes mellitus) type II uncontrolled, periph vascular disorder (01/02/2016), Dry skin (05/09/2019), Dysuria (11/29/2010), Essential (primary) hypertension (01/26/2015), Excessive daytime sleepiness (02/11/2018), FATIGUE (12/09/2007), Fibromyalgia (03/02/2017), Food intolerance (05/09/2019), GERD (12/09/2007), GERD (gastroesophageal reflux  disease), GI bleed (02/10/2015), Headache(784.0), Hearing loss, 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/12/2013), Hemorrhoid (05/22/2011), HTN (hypertension) (02/04/2010), Hyperlipidemia, Hyperlipidemia associated with type 2 diabetes mellitus (HCC) (11/22/2020), Hyperlipidemia LDL goal <70 (01/26/2015), Hypersomnia, persistent (11/05/2012), Hypertension, Hypokalemia (08/29/2020), Hypotension (07/04/2020), 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), 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), Increasing residual urine (03/10/2022), Insect bite (03/16/2018), Insomnia (08/05/2011), Internal and external hemorrhoids without complication (10/04/2012), Internal hemorrhoids (10/22/2014), 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), Leg swelling, Localized swelling of both lower legs (03/02/2017), Low back pain (07/20/2007), Lower extremity edema (07/04/2020), Lower GI bleed, Melena (12/16/2021), Mixed conductive and sensorineural hearing loss of right ear with restricted hearing of left ear (10/16/2020), Nasal congestion, Neck pain (03/15/2020), NEPHROLITHIASIS (08/13/2006), Nocturia more than twice per night (02/11/2018), OSA on CPAP (10/17/2014), Otalgia of both ears (06/06/2021), Other chronic pain (03/02/2017), Other fatigue (07/04/2020), Pain in finger of right hand (10/06/2012), Pain in joint of right shoulder (02/21/2017), Palpitations (01/26/2015), Paresthesia (07/25/2010), Persistent atrial fibrillation (HCC) (08/29/2020), Persistent headaches (10/17/2014), 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), PONV (postoperative nausea and vomiting), Preoperative clearance (07/04/2020), Prolapsed hemorrhoids (10/05/2013), Radiculopathy, lumbar region (03/15/2020), Rash (05/24/2019), Rash and other nonspecific skin eruption (07/23/2022), Raynaud's disease (03/02/2017), Raynaud's phenomenon (03/02/2017), Rectal bleeding (02/12/2015), Rectal pain, Rectal ulcer with bleeding after hemorrhoid banding (02/11/2015), Rhinitis, chronic  (12/23/2006), Right shoulder pain (02/15/2015), Seasonal and perennial allergic rhinitis (05/09/2019), Severe episode of recurrent major depressive disorder, without psychotic features (HCC) (08/29/2020), Sinus infection (06/30/2014), Sinusitis, acute, maxillary (02/02/2014), Sleep apnea, Sleep apnea with use of continuous positive airway pressure (CPAP) (02/11/2018), SOB (shortness of breath) (07/04/2020), Stroke (HCC), Stroke (HCC), Subacute confusional state (10/17/2014), SYMPTOM, HYPERSOMNIA NOS (02/19/2007), Thrombosed hemorrhoids, Tick bite of back (11/23/2019), Tinnitus of both ears (08/29/2020), Trouble swallowing, Unspecified hemorrhoids (02/10/2015), Unspecified sleep apnea (11/05/2012), UTI (urinary tract infection) (05/19/2022), Varicose veins of lower extremities with other complications (08/10/2012), Vasomotor rhinitis (06/28/2021), and Vertigo (03/19/2012).   They are presenting to PM&R clinic as a new patient for pain management evaluation. They were referred by Dr. Zola Button for treatment of chronic pain.    Chase [name] reports he has had chronic pain for about 10  years.  His speech is limited due to history of CVAs with aphasia.  Patient has neck pain with pain affecting his bilateral arms.  He also has lower back pain and pain in his legs.  He also has burning, pins-and-needles and tingling pain in his feet, suspected to be due to diabetes mellitus.  Walking and standing worsens his pain.  Pending backwards is also painful.  Patient indicates he has pain throughout his entire body.  He reports he was previously followed by Dr. Jordan Likes for his pain.  Patient reports he had injections in his back or neck and is unsure if this is beneficial.  He indicates he was later followed by other doctors that were prescribing him medications.  He has trouble swallowing pills so is taking liquid hydrocodone.  He says this medication is helping keep his pain under control.  He also takes gabapentin 100 mg 3 times  daily which she feels like is beneficial to the pain in his feet however he does not keep it controlled.   Chart review indicates he was previously diagnosed with fibromyalgia.   He reports history of Raynaud's phenomena   Patient is a poor historian and limitations due to aphasia         Red flag symptoms: No red flags for back pain endorsed in Hx or ROS   Medications tried:   Nsaids - limited by comorbidities  Tylenol - helps Opiates  hydrocodone  Gabapentin- TID 100mg ,helping  TCAs  -denies, doesn't recall  SNRIs  - effexor helps with mood      Other treatments: PT- helped in the past  Chiropractor TENs unit - Injections - back or neck injections not sure if this helped  Surgery- denies    Goals for pain control: Decrease overall body wide pain       Interval History 02/26/23 Chase [name] is here for follow-up regarding his back and neck pain, pain related to peripheral neuropathy, fibromyalgia.  He reports that pain is largely unchanged from last visit.  He has not consistently tried taking gabapentin 200 mg 3 times daily and has mostly been taking it at the 100 mg dose.  He has been having more pain in his left great toe, being treated with antibiotics by his PCP.  He has been walking with physical therapy and feels like this has been helping his balance and strength.  Physical therapy has also had a little benefit to his pain.  He reports he forgot to get records sent over from his prior pain clinic.  He says he stopped going there because they started doing more procedure treatments.  He says they referred him to a different provider but he did not follow-up on this.  He reports his pain was better controlled when he was taking the hydrocodone liquid.  He was able to walk a lot farther and do a lot more at home.   Interval History 03/26/23 Patient is here for follow-up regarding his chronic pain.  Pain is greatest in his neck, back, legs.  Patient does think that Lyrica has  been beneficial, not having any side effects at this time.  He continues to be frustrated by his aphasia, limiting his ability to communicate.  Interval History 04/23/2023 Chase [name] is here for follow-up regarding his chronic body wide pain.  Pain is the worst in his neck, lower back and legs.  Patient does feel that Lyrica is reducing his pain, however not keeping it under good control.  Patient  had insurance issues with the pharmacy and was unable to pick up his hydrocodone liquid, all of this appears to be resolved and pharmacy is ordering the medication for pickup.  Mood continues to be decreased and he feels depressed at times.  No SI or HI.  Interval History 06/25/23 Patient continues to have severe pain in his back and neck.  He also has milder pain in his arms and legs.  Patient reports benefit with Lyrica, feels like it is helping to reduce the pain in his legs. Patient reports liquid hydrocodone liquid 10 mL providing mild benefit to his pain.patient. Reports it worked much better in the past when he used 15 ml dose.   Patient is difficult historian due to expressive aphasia.  Interval History 07/22/2022 Patient reports continued severe pain throughout his whole back.  The next worst area of pain as throughout his legs.  He also has severe pain in his neck and shoulders.  Hydrocodone liquid is helping, he is using it sparingly when pain is only very severe.  He continues to use Lyrica 75 mg twice daily.  Reports he is tolerating both of these medications.Occasional constipation controlled with laxatives.  Ontinues to have pins-and-needles pain in his feet.   Interval History 09/04/22 Patient reports continued pain throughout his whole back, worse in the lumbar spine.  Reports also having pain in his hands, shoulders, feet.  Hydrocodone is helping reduce the back pain and when it is severe.  He is also taking Lyrica which is helping his overall body wide pain.  Patient reports he tried 1 or 2  sessions of aquatic therapy but stopped going-lack of motivation.  He is willing to try this again.  Communication more difficult due to chronic aphasia.   Pain Inventory Average Pain 7 Pain Right Now 6 My pain is intermittent, constant, sharp, burning, dull, stabbing, tingling, and aching  In the last 24 hours, has pain interfered with the following? General activity 7 Relation with others 7 Enjoyment of life 7 What TIME of day is your pain at its worst? varies Sleep (in general) Fair  Pain is worse with: walking, bending, sitting, standing, and some activites Pain improves with: Rest, Medication Relief from Meds: 6  Family History  Problem Relation Age of Onset   Breast cancer Mother 84       breast   Huntington's disease Mother    Stroke Father    Heart disease Father    Hypertension Father    Heart attack Father    Hypertension Brother    Hyperlipidemia Neg Hx    Diabetes Neg Hx    Social History   Socioeconomic History   Marital status: Single    Spouse name: Not on file   Number of children: 0   Years of education: Not on file   Highest education level: 12th grade  Occupational History   Not on file  Tobacco Use   Smoking status: Never   Smokeless tobacco: Never  Vaping Use   Vaping status: Never Used  Substance and Sexual Activity   Alcohol use: No    Alcohol/week: 0.0 standard drinks of alcohol    Comment: none   Drug use: No   Sexual activity: Never  Other Topics Concern   Not on file  Social History Narrative   No caffeine intake except for chocolate.  Exercised-less walking    Social Drivers of Health   Financial Resource Strain: Medium Risk (02/17/2023)   Overall Physicist, medical Strain (  CARDIA)    Difficulty of Paying Living Expenses: Somewhat hard  Food Insecurity: Food Insecurity Present (02/17/2023)   Hunger Vital Sign    Worried About Running Out of Food in the Last Year: Sometimes true    Ran Out of Food in the Last Year: Patient  declined  Transportation Needs: Patient Declined (02/17/2023)   PRAPARE - Administrator, Civil Service (Medical): Patient declined    Lack of Transportation (Non-Medical): Patient declined  Physical Activity: Unknown (02/17/2023)   Exercise Vital Sign    Days of Exercise per Week: Patient declined    Minutes of Exercise per Session: Not on file  Stress: Stress Concern Present (02/17/2023)   [employer] of Occupational Health - Occupational Stress Questionnaire    Feeling of Stress : To some extent  Social Connections: Unknown (02/17/2023)   Social Connection and Isolation Panel [NHANES]    Frequency of Communication with Friends and Family: Patient declined    Frequency of Social Gatherings with Friends and Family: Patient declined    Attends Religious Services: Patient declined    Database administrator or Organizations: Patient declined    Attends Engineer, structural: Not on file    Marital Status: Patient declined   Past Surgical History:  Procedure Laterality Date   CATARACT EXTRACTION     x 3   EYE SURGERY  1968, 1985, 1987   cataracts   FLEXIBLE SIGMOIDOSCOPY N/A 02/11/2015   Procedure: FLEXIBLE SIGMOIDOSCOPY;  Surgeon: Iva Boop, MD;  Location: [facility];  Service: Endoscopy;  Laterality: N/A;   INNER EAR SURGERY     rt ear   INNER EAR SURGERY  1992   surgery - right arm     1983   Past Surgical History:  Procedure Laterality Date   CATARACT EXTRACTION     x 3   EYE SURGERY  1968, 1985, 1987   cataracts   FLEXIBLE SIGMOIDOSCOPY N/A 02/11/2015   Procedure: FLEXIBLE SIGMOIDOSCOPY;  Surgeon: Iva Boop, MD;  Location: [facility];  Service: Endoscopy;  Laterality: N/A;   INNER EAR SURGERY     rt ear   INNER EAR SURGERY  1992   surgery - right arm     1983   Past Medical History:  Diagnosis Date   Abnormality of gait 11/10/2012   Acute GI bleeding    Acute renal insufficiency 02/10/2015   Acute upper respiratory infection  08/10/2012   Allergic drug reaction 05/24/2020   Allergic rhinitis 10/16/2020   Allergy    ANXIETY DEPRESSION 12/09/2007   Qualifier: Diagnosis of  By: Lillia Mountain RN, Brien Few    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) 03/12/2018   Cerebrovascular accident, old 11/16/2014   Chest discomfort 06/08/2019   Chronic back pain 11/28/2020   Chronic cough 08/27/2014   CXR 08/2014:  Mild hyperinflation, no acute process Cleda Daub 09/2014:  Difficulty with procedure, but essentially normal +CVA's in past, with aspiration risk by history Speech evaluation 2016:      Chronic left  shoulder pain 08/06/2020   Chronic pain syndrome 11/23/2019   Chronic pansinusitis 06/30/2018   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   Constipation    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   DDD (degenerative disc disease), cervical 04/26/2019   DDD (degenerative disc disease), lumbar 04/26/2019   Decreased hearing of both ears 02/12/2013   Deviated septum 05/31/2018   Diabetes mellitus without complication (HCC)    Difficulty urinating    DM (diabetes mellitus) type II uncontrolled, periph vascular disorder 01/02/2016   Dry skin 05/09/2019   Dysuria 11/29/2010   Essential (primary) hypertension 01/26/2015   Excessive daytime sleepiness 02/11/2018   FATIGUE 12/09/2007   Qualifier: Diagnosis of  By: Letta Kocher    Fibromyalgia 03/02/2017   Food intolerance 05/09/2019   GERD 12/09/2007   Qualifier: Diagnosis of  By: Alphonzo Severance MD, Loni Dolly    GERD (gastroesophageal  reflux disease)    GI bleed 02/10/2015   Headache 12/23/2006   Qualifier: Diagnosis of   By: Letta Kocher     IMO SNOMED Dx Update Oct 2024     Hearing loss    Hemangioma 02/12/2013   Hemorrhoid 05/22/2011   HTN (hypertension) 02/04/2010   Qualifier: Diagnosis of  By: Abner Greenspan MD, Stacey     Hyperlipidemia    Hyperlipidemia associated with type 2 diabetes mellitus (HCC) 11/22/2020   Hyperlipidemia LDL goal <70 01/26/2015   Hypersomnia, persistent 11/05/2012   epworth of 18 points, was placed on CPAP in 2008 with very low AHI ( no number  Mentioned) and struggles ever since with PAP. 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   Leg swelling    Localized swelling of both lower legs 03/02/2017   Low back pain 07/20/2007   Qualifier: Diagnosis of  By: Alphonzo Severance MD, Loni Dolly    Lower extremity edema 07/04/2020   Lower GI bleed    Melena 12/16/2021   Mixed conductive and sensorineural hearing loss of right ear with restricted hearing of left ear 10/16/2020   Nasal congestion    Neck pain 03/15/2020   NEPHROLITHIASIS 08/13/2006   Qualifier: Diagnosis of  By: Bradly Bienenstock     Nocturia more than twice per night 02/11/2018   OSA on CPAP 10/17/2014   Otalgia of both ears 06/06/2021   Other chronic pain 03/02/2017   Other fatigue 07/04/2020   Pain in finger of right hand 10/06/2012   Pain in joint of right shoulder 02/21/2017   Palpitations 01/26/2015   Paresthesia 07/25/2010   Persistent atrial fibrillation (HCC) 08/29/2020   Persistent headaches 10/17/2014   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   Qualifier:  Diagnosis of  By: Alphonzo Severance MD, Ivar Drape R    PONV (postoperative nausea and vomiting)    Preoperative clearance 07/04/2020   Preventative health care 11/26/2011   Prolapsed hemorrhoids 10/05/2013   Radiculopathy, lumbar region 03/15/2020   Rash 05/24/2019   Rash and other nonspecific skin eruption 07/23/2022   Raynaud's disease 03/02/2017   Raynaud's phenomenon 03/02/2017   Rectal bleeding 02/12/2015   Rectal pain    Rectal ulcer with bleeding after hemorrhoid  banding 02/11/2015   Rhinitis, chronic 12/23/2006   Qualifier: Diagnosis of  By: Lillia Mountain RN, Regina G    Right shoulder pain 02/15/2015   Seasonal and perennial allergic rhinitis 05/09/2019   Severe episode of recurrent major depressive disorder, 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   Sinus infection 06/30/2014   Sinusitis, acute, maxillary 02/02/2014   Sleep apnea    Sleep apnea with use of continuous positive airway pressure (CPAP) 02/11/2018   SOB (shortness of breath) 07/04/2020   Stroke (HCC)    Stroke (HCC)    Subacute confusional state 10/17/2014   SYMPTOM, HYPERSOMNIA NOS 02/19/2007   Qualifier: Diagnosis of  By: Vernie Murders     Thrombosed hemorrhoids    Tick bite of back 11/23/2019   Tinnitus of both ears 08/29/2020   Trouble swallowing    Unspecified hemorrhoids 02/10/2015   UTI (urinary tract infection) 05/19/2022   Varicose veins of bilateral lower extremities with other complications 08/10/2012   IMO SNOMED Dx Update Oct 2024     Vasomotor rhinitis 06/28/2021   Vertigo 03/19/2012   There were no vitals taken for this visit.  Opioid Risk Score:   Fall Risk Score:  `1  Depression screen [facility] 2/9     09/04/2023    3:46 PM 07/23/2023    2:10 PM 06/25/2023    2:54 PM 04/23/2023    3:16 PM 03/26/2023    3:11 PM 01/15/2023    3:06 PM 12/22/2022    2:24 PM  Depression screen PHQ 2/9  Decreased Interest 2 1  2  0 2 2  Down, Depressed, Hopeless 1 1  2  0 2 3  PHQ - 2 Score 3 2  4  0 4 5  Altered sleeping  2  2   3 2   Tired, decreased energy 2  2   2 2   Change in appetite 1     2 1   Feeling bad or failure about yourself  2     2 0  Trouble concentrating 2     2 1   Moving slowly or fidgety/restless 3     3 1   Suicidal thoughts 0     0 0  PHQ-9 Score 15     18 12   Difficult doing work/chores Somewhat difficult     Somewhat difficult Very difficult      Review of Systems  Constitutional: Negative.   HENT:  Negative for voice change.   Eyes:  Positive for visual disturbance.  Respiratory: Negative.    Cardiovascular: Negative.   Gastrointestinal: Negative.   Endocrine: Negative.   Genitourinary: Negative.   Musculoskeletal:  Positive for back pain, gait problem and neck pain. Negative for neck stiffness.       Pain in both shoulders, Feet, legs, arms  Skin: Negative.   Allergic/Immunologic: Negative.   Neurological:  Positive for dizziness, weakness, light-headedness and headaches.       Weakness in both hands  Hematological: Negative.   Psychiatric/Behavioral:  Positive for dysphoric mood.   All other systems reviewed and are negative.      Objective:   Physical Exam  Gen: no distress, normal appearing HEENT: oral mucosa pink and moist, NCAT Chest: normal effort, normal rate of breathing Abd: soft, non-distended Ext: no edema Psych: Frustrated with his language deficits skin: Warm and dry Neuro: Alert and awake, follows commands, cranial nerves II through XII grossly intact Patient has significant aphasia, primarily expressive Sensory exam normal for light touch in all 4 limbs-intact to light touch  in both feet throughout   Musculoskeletal:  Kyphotic posture Pain with spinal extension and facet loading Patient with diffuse tenderness to palpation throughout bilateral upper and lower extremities Paraspinal tenderness L-spine, T-spine, C-spine greatest at lumbar spine SLR negative  Lumbar spine MRI 06/05/2021 IMPRESSION: 1. No significant lumbar spine disc protrusion,  foraminal stenosis or central canal stenosis. 2. 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 Differential considerations include a nerve sheath tumor versus myxopapillary ependymoma. Metastatic disease is considered less likely given the lack of significant interval change.     Cervical spine MRI 01/09/2020 IMPRESSION: 1. C4-5 mild degenerative spinal stenosis which has improved from 2013 due to interval regression of a central herniation. 2. C5-6 advanced right foraminal stenosis. Mild to moderate foraminal narrowing bilaterally at C4-5 and on the left at C5-6.          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  Denies SI or HI 5) History of CVA.  Patient continues to have aphasia   Plan 1) Dc Gabapentin previously, increase Lyrica to 100 mg twice daily.  He denies any side effects with current regimen 2) Previously on liquid hydrocodone, Continue Hycet 7.5/15ml, 10-19ml Q12h PRN.  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   I called prior clinic and requested for records to be faxed over previously 5) Reconsult for Aquatic PT placed- [facility] 6) Continue venlafaxine XR 150 mg daily 7) continue Flexeril as needed 8) consider Qutenza if polyneuropathy pain does not improve with oral medications 9) order repeat L spine MRI-may help evaluate for interventional options  but also to follow-up on prior mass noted on MRI 10) continue monitor UDS, PDMP, pill counts-patient brought his hydrocodone bottle today

## 2023-09-09 ENCOUNTER — Other Ambulatory Visit: Payer: Self-pay | Admitting: Cardiology

## 2023-09-10 ENCOUNTER — Other Ambulatory Visit: Payer: Self-pay | Admitting: Internal Medicine

## 2023-09-15 ENCOUNTER — Other Ambulatory Visit (HOSPITAL_COMMUNITY): Payer: Self-pay

## 2023-09-18 ENCOUNTER — Other Ambulatory Visit: Payer: Self-pay | Admitting: Family Medicine

## 2023-09-18 ENCOUNTER — Other Ambulatory Visit: Payer: Self-pay | Admitting: Internal Medicine

## 2023-09-18 ENCOUNTER — Other Ambulatory Visit: Payer: Self-pay | Admitting: Cardiology

## 2023-09-18 DIAGNOSIS — I10 Essential (primary) hypertension: Secondary | ICD-10-CM

## 2023-09-19 ENCOUNTER — Ambulatory Visit
Admission: RE | Admit: 2023-09-19 | Discharge: 2023-09-19 | Disposition: A | Discharge status: Home / Self Care | Source: Ambulatory Visit | Attending: Physical Medicine & Rehabilitation | Admitting: Physical Medicine & Rehabilitation

## 2023-09-19 DIAGNOSIS — M47816 Spondylosis without myelopathy or radiculopathy, lumbar region: Secondary | ICD-10-CM | POA: Diagnosis not present

## 2023-09-19 DIAGNOSIS — M48061 Spinal stenosis, lumbar region without neurogenic claudication: Secondary | ICD-10-CM | POA: Diagnosis not present

## 2023-09-19 DIAGNOSIS — M5126 Other intervertebral disc displacement, lumbar region: Secondary | ICD-10-CM | POA: Diagnosis not present

## 2023-09-19 DIAGNOSIS — G8929 Other chronic pain: Secondary | ICD-10-CM

## 2023-09-22 ENCOUNTER — Other Ambulatory Visit (HOSPITAL_COMMUNITY): Payer: Self-pay

## 2023-09-22 ENCOUNTER — Other Ambulatory Visit: Payer: Self-pay | Admitting: Cardiology

## 2023-09-24 ENCOUNTER — Other Ambulatory Visit (HOSPITAL_COMMUNITY): Payer: Self-pay

## 2023-10-01 ENCOUNTER — Other Ambulatory Visit: Payer: Self-pay | Admitting: Family Medicine

## 2023-10-01 MED ORDER — EPINEPHRINE 0.3 MG/0.3ML IJ SOAJ: 0.3000 mg | INTRAMUSCULAR | 0 refills | Status: AC | PRN

## 2023-10-02 ENCOUNTER — Other Ambulatory Visit: Payer: Self-pay | Admitting: Cardiology

## 2023-10-06 ENCOUNTER — Other Ambulatory Visit: Payer: Self-pay | Admitting: Cardiology

## 2023-10-08 ENCOUNTER — Encounter: Payer: Self-pay | Admitting: Internal Medicine

## 2023-10-08 ENCOUNTER — Ambulatory Visit (INDEPENDENT_AMBULATORY_CARE_PROVIDER_SITE_OTHER): Payer: Medicaid Other | Admitting: Internal Medicine

## 2023-10-08 VITALS — BP 120/70 | HR 95 | Ht 69.0 in | Wt 169.8 lb

## 2023-10-08 DIAGNOSIS — E1159 Type 2 diabetes mellitus with other circulatory complications: Secondary | ICD-10-CM

## 2023-10-08 DIAGNOSIS — E785 Hyperlipidemia, unspecified: Secondary | ICD-10-CM

## 2023-10-08 DIAGNOSIS — Z7984 Long term (current) use of oral hypoglycemic drugs: Secondary | ICD-10-CM

## 2023-10-08 DIAGNOSIS — E1165 Type 2 diabetes mellitus with hyperglycemia: Secondary | ICD-10-CM | POA: Diagnosis not present

## 2023-10-08 DIAGNOSIS — Z7985 Long-term (current) use of injectable non-insulin antidiabetic drugs: Secondary | ICD-10-CM

## 2023-10-08 LAB — POCT GLYCOSYLATED HEMOGLOBIN (HGB A1C): Hemoglobin A1C: 7.1 % — AB (ref 4.0–5.6)

## 2023-10-08 MED ORDER — FREESTYLE LIBRE 3 PLUS SENSOR MISC
1.0000 | 3 refills | Status: DC
Start: 2023-10-08 — End: 2024-05-11

## 2023-10-08 NOTE — Telephone Encounter (Signed)
Any update on this appeal.

## 2023-10-08 NOTE — Patient Instructions (Addendum)
 Please continue: - Metformin  500 mg 2x a day - Jardiance  25 mg before b'fast - Ozempic  1 mg weekly  Please return in 4 months.

## 2023-10-08 NOTE — Progress Notes (Signed)
 Patient ID: Chase [name], male   DOB: [birth year], 63 y.o.   MRN: [id number]  HPI: Chase [name] is a 63 y.o.-year-old male, presenting for follow-up for DM2, dx in 2014, non-insulin -dependent, uncontrolled, with complications (PVD, cerebro-vascular ds - s/p "mini"strokes, PN). Last visit 4 months ago. He had [employer], now [place] only.  Interim history: He has occasional blurry vision, no increased urination. He continues to have memory loss and difficulty word finding. He was previously drinking apple juice >> now mostly diet drinks.  He continues to have chronic generalized pain and fibromyalgia.  HbA1c levels reviewed: 06/05/2023: HbA1c 8.1% Lab Results  Component Value Date   HGBA1C 7.6 (A) 02/02/2023   HGBA1C 7.8 (A) 09/19/2022   HGBA1C 8.2 (A) 05/20/2022   HGBA1C 6.5 (A) 01/14/2022   HGBA1C 6.7 (A) 09/10/2021   HGBA1C 6.7 (A) 06/03/2021   HGBA1C 6.2 (A) 01/03/2021   HGBA1C 6.7 (A) 09/10/2020   HGBA1C 8.2 (H) 05/30/2020   HGBA1C 8.3 (H) 05/24/2020   HGBA1C 9.7 (H) 04/05/2020   HGBA1C 8.3 (H) 05/24/2019   HGBA1C 7.9 (A) 08/04/2018   HGBA1C 7.5 (A) 12/21/2017   HGBA1C 7.4 09/17/2017   HGBA1C 7.8 06/19/2017   HGBA1C 7.1 (H) 02/19/2017   HGBA1C 7.4 02/21/2016   HGBA1C 9.8 (H) 11/13/2015   HGBA1C 6.8 (H) 02/11/2015   Pt is on a regimen of:  - Metformin  500 mg 1x a day >> 2x >> 1x a day b/c problems with swallowing >> 1-2 >> 2x a day - Jardiance  25 mg at night >> before breakfast - Ozempic  0.5 >> 1 mg weekly In 11/2018, we tried to change from Januvia  to Ozempic  but this was not covered. Stopped Glipizide  b/c rash. Stopped Invokana  100 mg in am >> only got this for 1 mo He was on Metformin  500 mg po bid - in liquid form, as he could not swallow pills. He gets gi upset. He stopped this in 2014. He was previously on Januvia , then Victoza .  His fingerstick values are not accurate due to Raynaud's syndrome, however, freestyle libre CGM was not approved by his  insurance for the last few years, until recently.  He was able to start the freestyle libre 3:  Prev.:   Lowest sugar was 108 >> 115 >> 107 >> 80s >>  >100 >> 70s;  he has hypoglycemia awareness in the 70s. Highest sugar was 261 (Prednisone ) >> 183 >> 214 >> 212 >> 250 >> 200.  Glucometer: [employer] Next >> AccuChek guide  In the past, he was occasional lemonade and ginger ale >> advised to stop - still drinking these but rarely.  -+ Mild CKD, last BUN/creatinine:  Lab Results  Component Value Date   BUN 8 02/17/2023   BUN 8 12/22/2022   CREATININE 1.06 02/17/2023   CREATININE 0.95 12/22/2022   Lab Results  Component Value Date   MICRALBCREAT 9 06/05/2023   MICRALBCREAT 1.5 05/24/2020   MICRALBCREAT 1.0 08/04/2017   MICRALBCREAT 1.1 11/13/2015   MICRALBCREAT 1.8 10/03/2014   MICRALBCREAT 0.4 09/17/2012   -+ HL.  last set of lipids: Lab Results  Component Value Date   CHOL 134 12/22/2022   HDL 27.50 (L) 12/22/2022   LDLCALC 93 06/20/2022   LDLDIRECT 59.0 12/22/2022   TRIG 281.0 (H) 12/22/2022   CHOLHDL 5 12/22/2022  On pravastatin  40.  - last eye exam was in 2022: No DR reportedly.  Dr. Nereida Banning. Coming up.  - He has numbness and tingling  in his feet, also pain.  Foot exam performed 03/04/2023 by Dr. Luster Salters.  He had paronychia on exam at that time.  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 Has varicose veins >> wears compresion hoses.He also has a h/o kidney stones.   On allopurinol  for gout. He has anxiety, PTSD from childhood.  Early in 2022, he was diagnosed with an intradural extramedullary spinal tumor.  It did not change in appearance on a recent MRI. He had prostatitis and UTIs. He is on Tamsulozin (changed from terazosin) and finasteride .  He was on nitrofurantoin.  ROS: + See HPI.  Past Medical History:  Diagnosis Date   Abnormality of gait 11/10/2012   Acute GI bleeding    Acute renal  insufficiency 02/10/2015   Acute upper respiratory infection 08/10/2012   Allergic drug reaction 05/24/2020   Allergic rhinitis 10/16/2020   Allergy    ANXIETY DEPRESSION 12/09/2007   Qualifier: Diagnosis of  By: Gardenia Jump RN, Glena Landau    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) 03/12/2018   Cerebrovascular accident, old 11/16/2014   Chest discomfort 06/08/2019   Chronic back pain 11/28/2020   Chronic cough 08/27/2014   CXR 08/2014:  Mild hyperinflation, no acute process Spiro 09/2014:  Difficulty with procedure, but essentially normal +CVA's in past, with aspiration risk by history Speech evaluation 2016:      Chronic left shoulder pain 08/06/2020   Chronic pain syndrome 11/23/2019   Chronic pansinusitis 06/30/2018   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   Constipation    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   DDD (degenerative disc disease), cervical 04/26/2019   DDD (degenerative disc disease), lumbar 04/26/2019   Decreased hearing of both ears 02/12/2013   Deviated septum 05/31/2018   Diabetes mellitus without complication (HCC)    Difficulty urinating    DM (diabetes mellitus) type II uncontrolled, periph vascular disorder 01/02/2016   Dry skin 05/09/2019   Dysuria 11/29/2010   Essential (primary) hypertension 01/26/2015   Excessive daytime sleepiness 02/11/2018   FATIGUE 12/09/2007   Qualifier: Diagnosis of  By: Gardenia Jump RN, Glena Landau    Fibromyalgia 03/02/2017   Food intolerance 05/09/2019   GERD 12/09/2007   Qualifier: Diagnosis  of  By: Kingsley Penny MD, Darlene Ehlers    GERD (gastroesophageal reflux disease)    GI bleed 02/10/2015   Headache 12/23/2006   Qualifier: Diagnosis of   By: Toi Foster     IMO SNOMED Dx Update Oct 2024     Hearing loss    Hemangioma 02/12/2013   Hemorrhoid 05/22/2011   HTN (hypertension) 02/04/2010   Qualifier: Diagnosis of  By: Rodrick Clapper MD, Stacey     Hyperlipidemia    Hyperlipidemia associated with type 2 diabetes mellitus (HCC) 11/22/2020   Hyperlipidemia LDL goal <70 01/26/2015   Hypersomnia, persistent 11/05/2012   epworth of 18 points, was placed on CPAP in 2008 with very low AHI ( no  number  Mentioned) and struggles ever since with PAP. 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   Leg swelling    Localized swelling of both lower legs 03/02/2017   Low back pain 07/20/2007   Qualifier: Diagnosis of  By: Kingsley Penny MD, Darlene Ehlers    Lower extremity edema 07/04/2020   Lower GI bleed    Melena 12/16/2021   Mixed conductive and sensorineural hearing loss of right ear with restricted hearing of left ear 10/16/2020   Nasal congestion    Neck pain 03/15/2020   NEPHROLITHIASIS 08/13/2006   Qualifier: Diagnosis of  By: Alen Amy     Nocturia more than twice per night 02/11/2018   OSA on CPAP 10/17/2014   Otalgia of both ears 06/06/2021   Other chronic pain 03/02/2017   Other fatigue 07/04/2020   Pain in finger of right hand 10/06/2012   Pain in joint of right shoulder 02/21/2017   Palpitations 01/26/2015   Paresthesia 07/25/2010   Persistent atrial fibrillation (HCC) 08/29/2020   Persistent headaches  10/17/2014   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   Qualifier: Diagnosis of  By: Kingsley Penny MD, Cindie Credit R    PONV (postoperative nausea and vomiting)    Preoperative clearance 07/04/2020   Preventative health care 11/26/2011   Prolapsed hemorrhoids 10/05/2013   Radiculopathy, lumbar region 03/15/2020   Rash 05/24/2019   Rash and other nonspecific skin eruption 07/23/2022   Raynaud's disease 03/02/2017   Raynaud's phenomenon 03/02/2017   Rectal bleeding 02/12/2015   Rectal pain    Rectal ulcer with bleeding after hemorrhoid banding 02/11/2015   Rhinitis, chronic 12/23/2006   Qualifier: Diagnosis of  By: Gardenia Jump RN, Regina G    Right shoulder pain 02/15/2015   Seasonal and perennial allergic rhinitis 05/09/2019   Severe episode of recurrent major depressive disorder, 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   Sinus infection 06/30/2014   Sinusitis, acute, maxillary 02/02/2014   Sleep apnea    Sleep apnea with use of continuous positive airway pressure (CPAP) 02/11/2018   SOB (shortness of breath) 07/04/2020   Stroke (HCC)    Stroke (HCC)    Subacute confusional state 10/17/2014   SYMPTOM, HYPERSOMNIA NOS 02/19/2007   Qualifier: Diagnosis of  By: Cala Castleman     Thrombosed hemorrhoids    Tick bite of back 11/23/2019   Tinnitus of both ears 08/29/2020   Trouble swallowing    Unspecified hemorrhoids 02/10/2015   UTI (urinary tract infection) 05/19/2022   Varicose veins of bilateral lower extremities with other complications 08/10/2012   IMO SNOMED Dx Update Oct 2024     Vasomotor rhinitis 06/28/2021   Vertigo 03/19/2012   Past Surgical History:  Procedure Laterality Date   CATARACT EXTRACTION     x 3   EYE SURGERY  1968, 1985, 1987   cataracts   FLEXIBLE SIGMOIDOSCOPY N/A 02/11/2015   Procedure: FLEXIBLE SIGMOIDOSCOPY;  Surgeon: Kenney Peacemaker, MD;  Location: [facility];  Service: Endoscopy;  Laterality: N/A;   INNER EAR SURGERY     rt ear   INNER EAR SURGERY  1992    surgery - right arm     1983  Social History   Socioeconomic History   Marital status: Single    Spouse name: Not on file   Number of children: 0   Years of education: Not on file   Highest education level: 12th grade  Occupational History   Not on file  Tobacco Use   Smoking status: Never   Smokeless tobacco: Never  Vaping Use   Vaping status: Never Used  Substance and Sexual Activity   Alcohol  use: No    Alcohol /week: 0.0 standard drinks of alcohol     Comment: none   Drug use: No   Sexual activity: Never  Other Topics Concern   Not on file  Social History Narrative   No caffeine intake except for chocolate.  Exercised-less walking    Social Drivers of Health   Financial Resource Strain: Medium Risk (02/17/2023)   Overall Financial Resource Strain (CARDIA)    Difficulty of Paying Living Expenses: Somewhat hard  Food Insecurity: Food Insecurity Present (02/17/2023)   Hunger Vital Sign    Worried About Running Out of Food in the Last Year: Sometimes true    Ran Out of Food in the Last Year: Patient declined  Transportation Needs: Patient Declined (02/17/2023)   PRAPARE - Administrator, Civil Service (Medical): Patient declined    Lack of Transportation (Non-Medical): Patient declined  Physical Activity: Unknown (02/17/2023)   Exercise Vital Sign    Days of Exercise per Week: Patient declined    Minutes of Exercise per Session: Not on file  Stress: Stress Concern Present (02/17/2023)   [employer] of Occupational Health - Occupational Stress Questionnaire    Feeling of Stress : To some extent  Social Connections: Unknown (02/17/2023)   Social Connection and Isolation Panel [NHANES]    Frequency of Communication with Friends and Family: Patient declined    Frequency of Social Gatherings with Friends and Family: Patient declined    Attends Religious Services: Patient declined    Database administrator or Organizations: Patient declined    Attends Tax inspector Meetings: Not on file    Marital Status: Patient declined  Intimate Partner Violence: Unknown (01/17/2022)   Received from [employer], [facility]   HITS    Physically Hurt: Not on file    Insult or Talk Down To: Not on file    Threaten Physical Harm: Not on file    Scream or Curse: Not on file   Current Outpatient Medications on File Prior to Visit  Medication Sig Dispense Refill   Accu-Chek Softclix Lancets lancets Use as instructed 1x a day 100 each PRN   allopurinol  (ZYLOPRIM ) 100 MG tablet Take 1 tablet (100 mg total) by mouth 2 (two) times daily. 180 tablet 1   amLODipine  (NORVASC ) 2.5 MG tablet Take 1 tablet (2.5 mg total) by mouth daily. Must keep April's appt for additional refills 15 tablet 0   azelastine  (ASTELIN ) 0.1 % nasal spray Place 1 spray into both nostrils 2 (two) times daily. Use in each nostril as directed 30 mL 12   Azelastine  HCl (ASTEPRO ) 0.15 % SOLN Place 1 spray into the nose in the morning and at bedtime. 1 mL 3   BD PEN NEEDLE NANO 2ND GEN 32G X 4 MM MISC USE ONCE DAILY AS DIRECTED 100 each 3   clotrimazole -betamethasone  (LOTRISONE ) cream Apply 1 application  topically as needed for rash.     Continuous Glucose Sensor (FREESTYLE LIBRE 3 PLUS SENSOR) MISC INJECT 1 DEVICE INTO THE SKIN CONTINUOUS.  CHANGE EVERY 15 DAYS 6 each 3   Continuous Glucose Sensor (FREESTYLE LIBRE 3 SENSOR) MISC 1 each by Does not apply route every 14 (fourteen) days. 6 each 3   cyclobenzaprine  (FLEXERIL ) 10 MG tablet TAKE 1 TABLET BY MOUTH TWICE A DAY AS NEEDED FOR MUSCLE SPASM 42 tablet 2   empagliflozin  (JARDIANCE ) 25 MG TABS tablet TAKE 1 TABLET (25 MG TOTAL) BY MOUTH DAILY. 90 tablet 2   EPINEPHrine  0.3 mg/0.3 mL IJ SOAJ injection Inject 0.3 mg into the muscle as needed for anaphylaxis. 2 each 0   famotidine  (PEPCID ) 40 MG/5ML suspension Take 2.5 mLs (20 mg total) by mouth daily. 100 mL 0   FIBER SELECT GUMMIES PO Take 1 tablet by mouth daily.     finasteride  (PROSCAR ) 5  MG tablet Take 5 mg by mouth daily.     fluticasone  (CUTIVATE ) 0.05 % cream Apply 1 application topically 2 (two) times daily.     glucose blood (ACCU-CHEK GUIDE) test strip Use as instructed 1x a day - 100 each PRN   HYDROcodone -acetaminophen  (HYCET) 7.5-325 mg/15 ml solution Take 10-15 mLs by mouth every 12 (twelve) hours as needed for moderate pain (pain score 4-6). 600 mL 0   hydrocortisone  (ANUSOL -HC) 2.5 % rectal cream PLACE 1 APPLICATION RECTALLY 2 (TWO) TIMES DAILY. 30 g 2   hydrOXYzine  (ATARAX ) 25 MG tablet TAKE 1 TABLET (25 MG TOTAL) BY MOUTH EVERY 8 (EIGHT) HOURS AS NEEDED FOR ANXIETY OR ITCHING. 90 tablet 2   losartan  (COZAAR ) 100 MG tablet Take 1 tablet (100 mg total) by mouth daily. 30 tablet 0   metFORMIN  (GLUCOPHAGE ) 500 MG tablet TAKE 1 TABLET BY MOUTH 2 TIMES DAILY WITH A MEAL. 180 tablet 1   metoprolol  tartrate (LOPRESSOR ) 50 MG tablet Take 1 tablet (50 mg total) by mouth daily. 30 tablet 0   polyethylene glycol powder (GLYCOLAX /MIRALAX ) 17 GM/SCOOP powder TAKE 17 G BY MOUTH DAILY AS NEEDED. 238 g 5   pravastatin  (PRAVACHOL ) 20 MG tablet TAKE 2 TABLETS (40 MG TOTAL) BY MOUTH DAILY. 180 tablet 1   pregabalin  (LYRICA ) 100 MG capsule Take 1 capsule (100 mg total) by mouth 2 (two) times daily. 60 capsule 4   promethazine -dextromethorphan (PROMETHAZINE -DM) 6.25-15 MG/5ML syrup Take 5 mLs by mouth 4 (four) times daily as needed. (Patient not taking: Reported on 09/04/2023) 118 mL 0   Semaglutide , 1 MG/DOSE, (OZEMPIC , 1 MG/DOSE,) 4 MG/3ML SOPN Inject 1 mg into the skin once a week. 9 mL 3   tamsulosin  (FLOMAX ) 0.4 MG CAPS capsule Take 0.4 mg by mouth daily.     triamcinolone  (NASACORT ) 55 MCG/ACT AERO nasal inhaler Place 2 sprays into the nose daily. 1 each 12   venlafaxine  XR (EFFEXOR -XR) 75 MG 24 hr capsule TAKE 1 CAPSULE BY MOUTH DAILY WITH BREAKFAST. 90 capsule 1   No current facility-administered medications on file prior to visit.   Allergies  Allergen Reactions   Amoxicillin  Hives, Itching and Other (See Comments)    White tongue; ? hives   Sulfa  Antibiotics Hives and Rash   Terfenadine     ? reaction   Family History  Problem Relation Age of Onset   Breast cancer Mother 44       breast   Huntington's disease Mother    Stroke Father    Heart disease Father    Hypertension Father    Heart attack Father    Hypertension Brother    Hyperlipidemia Neg Hx    Diabetes Neg Hx  PE: BP 120/70   Pulse 95   Ht 5\' 9"  (1.753 m)   Wt 169 lb 12.8 oz (77 kg)   SpO2 97%   BMI 25.08 kg/m  Wt Readings from Last 10 Encounters:  10/08/23 169 lb 12.8 oz (77 kg)  09/04/23 173 lb 9.6 oz (78.7 kg)  07/23/23 174 lb (78.9 kg)  06/25/23 179 lb (81.2 kg)  06/05/23 177 lb 12.8 oz (80.6 kg)  04/23/23 178 lb 3.2 oz (80.8 kg)  03/26/23 175 lb (79.4 kg)  02/26/23 178 lb 12.8 oz (81.1 kg)  02/17/23 179 lb 3.2 oz (81.3 kg)  02/02/23 176 lb (79.8 kg)   Constitutional: Slightly overweight, in NAD Eyes: EOMI, no exophthalmos ENT: no thyromegaly, no cervical lymphadenopathy Cardiovascular: tachycardia, RR, No MRG, + mild swelling and pain on palpation in B LE  -wears compression hoses Respiratory: CTA B Musculoskeletal: no deformities Skin: No rashes Neurological: no tremor with outstretched hands  ASSESSMENT: 1. DM2, non-insulin -dependent, uncontrolled, with complications - PVD - cerebro-vascular ds - s/p "mini"strokes - PN  2. HL  PLAN:  1. Patient with history of uncontrolled type 2 diabetes, on metformin , SGLT2 inhibitor and weekly GLP-1 receptor agonist with improved control after changing from Januvia  to Victoza  and then finally to Ozempic .  HbA1c decreased to 6.5% at that time but then increased to 7.6% and then 8.1% at last visit.  At last visit he was finally on a CGM, which I tried to obtain for him for several years of insurance denials!  However, in the new year, a CGM was denied by his insurance again. - At last visit sugars were fluctuating within a  narrow range slightly above 180.  We discussed about possible options to improve this including long-acting insulin  which we try to avoid at that time.  We discussed about TZD's or increasing the Ozempic  dose.  He agreed to increase Ozempic  to 1 mg weekly.  A PA for Ozempic  was approved recently. GM interpretation: -At today's visit, we reviewed his CGM downloads: It appears that 78% of values are in target range (goal >70%), while 22% are higher than 180 (goal <25%), and 0% are lower than 70 (goal <4%).  The calculated average blood sugar is 159.  The projected HbA1c for the next 3 months (GMI) is 7.1%. -Reviewing the CGM trends, sugars appear to be significantly improved from last visit, most likely due to increasing the Ozempic  dose.  He tolerated well.  He has some mild hyperglycemic spikes but due to the significant improvement in blood sugars, I did not suggest a change in his regimen.  However, I am worried about his insurance denial to cover his freestyle libre 3+ CGM.  He was told by the pharmacy that he is not able to get the freestyle libre 3 anymore, so we sent a prescription for the libre 3+ to his pharmacy in 07/2023.  This was denied despite a PA.  We sent an appeal last month.  Will go ahead and send another appeal.  We did discuss about possibly paying for it out-of-pocket, but I am really hoping that he will be able to have it covered. - I suggested to:  Patient Instructions  Please continue: - Metformin  500 mg 1-2x a day - Jardiance  25 mg before b'fast - Ozempic  1 mg weekly  Please return in 4 months.  - we checked his HbA1c: 7%  - advised to check sugars at different times of the day - 4x a day, rotating check times -  advised for yearly eye exams >> he is UTD - return to clinic in 4 months  2. HL - Latest lipid panel from 12/2022 showed an LDL above our target of less than 55, elevated triglycerides, and low HDL, Lab Results  Component Value Date   CHOL 134 12/22/2022   HDL  27.50 (L) 12/22/2022   LDLCALC 93 06/20/2022   LDLDIRECT 59.0 12/22/2022   TRIG 281.0 (H) 12/22/2022   CHOLHDL 5 12/22/2022  -He continues on pravastatin  40 mg daily without side effects  Emilie Harden, MD PhD [facility] Endocrinology

## 2023-10-10 ENCOUNTER — Encounter (INDEPENDENT_AMBULATORY_CARE_PROVIDER_SITE_OTHER): Payer: Self-pay

## 2023-10-12 ENCOUNTER — Other Ambulatory Visit (HOSPITAL_COMMUNITY): Payer: Self-pay

## 2023-10-13 ENCOUNTER — Encounter: Payer: Self-pay | Admitting: Cardiology

## 2023-10-13 ENCOUNTER — Other Ambulatory Visit (HOSPITAL_COMMUNITY): Payer: Self-pay

## 2023-10-13 ENCOUNTER — Ambulatory Visit: Attending: Cardiology | Admitting: Cardiology

## 2023-10-13 VITALS — BP 122/70 | HR 94 | Ht 69.6 in | Wt 171.1 lb

## 2023-10-13 DIAGNOSIS — E088 Diabetes mellitus due to underlying condition with unspecified complications: Secondary | ICD-10-CM | POA: Diagnosis not present

## 2023-10-13 DIAGNOSIS — E782 Mixed hyperlipidemia: Secondary | ICD-10-CM | POA: Diagnosis not present

## 2023-10-13 DIAGNOSIS — I10 Essential (primary) hypertension: Secondary | ICD-10-CM

## 2023-10-13 DIAGNOSIS — I69320 Aphasia following cerebral infarction: Secondary | ICD-10-CM | POA: Diagnosis not present

## 2023-10-13 NOTE — Progress Notes (Signed)
 Cardiology Office Note:    Date:  10/13/2023   ID:  Chase [name], DOB January 18, 1961, MRN [id number]  PCP:  Estill Hemming, DO  Cardiologist:  Nelia Balzarine, MD   Referring MD: Crecencio Dodge, Candida Chalk, *    ASSESSMENT:    1. Essential (primary) hypertension   2. 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 Mixed dyslipidemia   4. Diabetes mellitus due to underlying condition with unspecified complications (HCC)    PLAN:    In order of problems listed above:  Primary prevention stressed with the patient.  Importance of compliance with diet medication stressed and patient verbalized standing. Essential hypertension: Blood pressure stable and diet was emphasized. Mixed dyslipidemia: On lipid-lowering medications followed by primary care.  Goal LDL must be less than 70. Diabetes mellitus: Hemoglobin A1c greater than 7 and I cautioned him against this and he promises to do better. Patient will be seen in follow-up appointment in 6 months or earlier if the patient has any concerns.    Medication Adjustments/Labs and Tests Ordered: Current medicines are reviewed at length with the patient today.  Concerns regarding medicines are outlined above.  Orders Placed This Encounter  Procedures   EKG 12-Lead   No orders of the defined types were placed in this encounter.    No chief complaint on file.    History of Present Illness:    Chase [name] is a 63 y.o. male.  Patient has past medical history of essential hypertension, mixed dyslipidemia, diabetes mellitus.  He lives with his friend.  He denies any problems at this time and takes care of activities of daily living.  Overall he leads a sedentary lifestyle.  At the time of my evaluation, the patient is alert awake oriented and in no distress.  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) 03/12/2018   Cerebrovascular accident, old 11/16/2014   Chest discomfort 06/08/2019   Chronic back pain 11/28/2020   Chronic cough 08/27/2014   CXR 08/2014:  Mild hyperinflation, no acute process Spiro 09/2014:  Difficulty with procedure, but essentially normal +CVA's in past, with aspiration risk by history Speech evaluation 2016:      Chronic left shoulder pain 08/06/2020   Chronic pain syndrome 11/23/2019   Chronic pansinusitis 06/30/2018   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   Constipation    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   DDD (degenerative disc disease), cervical 04/26/2019   DDD (degenerative disc disease), lumbar 04/26/2019   Decreased hearing of both ears 02/12/2013   Deviated septum 05/31/2018   Diabetes mellitus without complication (HCC)  Difficulty urinating    DM (diabetes mellitus) type II uncontrolled, periph vascular disorder 01/02/2016   Dry skin 05/09/2019   Dysuria 11/29/2010   Essential (primary) hypertension 01/26/2015   Excessive daytime sleepiness 02/11/2018   FATIGUE 12/09/2007   Qualifier: Diagnosis of  By: Gardenia Jump RN, Glena Landau    Fibromyalgia 03/02/2017   Food intolerance 05/09/2019   GERD 12/09/2007   Qualifier: Diagnosis of  By: Kingsley Penny MD, Darlene Ehlers    GERD (gastroesophageal reflux  disease)    GI bleed 02/10/2015   Headache 12/23/2006   Qualifier: Diagnosis of   By: Toi Foster     IMO SNOMED Dx Update Oct 2024     Hearing loss    Hemangioma 02/12/2013   Hemorrhoid 05/22/2011   HTN (hypertension) 02/04/2010   Qualifier: Diagnosis of  By: Rodrick Clapper MD, Stacey     Hyperlipidemia    Hyperlipidemia associated with type 2 diabetes mellitus (HCC) 11/22/2020   Hyperlipidemia LDL goal <70 01/26/2015   Hypersomnia, persistent 11/05/2012   epworth of 18 points, was placed on CPAP in 2008 with very low AHI ( no number  Mentioned) and struggles ever since with PAP. 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   Leg swelling    Localized swelling of both lower legs 03/02/2017   Low back pain 07/20/2007   Qualifier: Diagnosis of  By: Kingsley Penny MD, Darlene Ehlers    Lower extremity edema 07/04/2020   Lower GI bleed    Melena 12/16/2021   Mixed conductive and sensorineural hearing loss of right ear with restricted hearing of left ear 10/16/2020   Nasal congestion    Neck pain 03/15/2020   NEPHROLITHIASIS 08/13/2006   Qualifier: Diagnosis of  By: Alen Amy     Nocturia more than twice per night 02/11/2018   OSA on CPAP 10/17/2014   Otalgia of both ears 06/06/2021   Other chronic pain 03/02/2017   Other fatigue 07/04/2020   Pain in finger of right hand 10/06/2012   Pain in joint of right shoulder 02/21/2017   Palpitations 01/26/2015   Paresthesia 07/25/2010   Persistent atrial fibrillation (HCC) 08/29/2020   Persistent headaches 10/17/2014   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   Qualifier: Diagnosis of   By: Kingsley Penny MD, Cindie Credit R    PONV (postoperative nausea and vomiting)    Preoperative clearance 07/04/2020   Preventative health care 11/26/2011   Prolapsed hemorrhoids 10/05/2013   Radiculopathy, lumbar region 03/15/2020   Rash 05/24/2019   Rash and other nonspecific skin eruption 07/23/2022   Raynaud's disease 03/02/2017   Raynaud's phenomenon 03/02/2017   Rectal bleeding 02/12/2015   Rectal pain    Rectal ulcer with bleeding after hemorrhoid banding 02/11/2015   Rhinitis, chronic 12/23/2006   Qualifier: Diagnosis of  By: Gardenia Jump RN, Regina G    Right shoulder pain 02/15/2015   Seasonal and perennial allergic rhinitis 05/09/2019   Severe episode of recurrent major depressive disorder, 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   Sinus infection 06/30/2014   Sinusitis, acute, maxillary 02/02/2014   Sleep apnea    Sleep apnea with use of continuous positive airway pressure (CPAP) 02/11/2018  SOB (shortness of breath) 07/04/2020   Stroke (HCC)    Stroke [facility])    Subacute confusional state 10/17/2014   SYMPTOM, HYPERSOMNIA NOS 02/19/2007   Qualifier: Diagnosis of  By: Cala Castleman     Thrombosed hemorrhoids    Tick bite of back 11/23/2019   Tinnitus of both ears 08/29/2020   Trouble swallowing    Unspecified hemorrhoids 02/10/2015   UTI (urinary tract infection) 05/19/2022   Varicose veins of bilateral lower extremities with other complications 08/10/2012   IMO SNOMED Dx Update Oct 2024     Vasomotor rhinitis 06/28/2021   Vertigo 03/19/2012    Past Surgical History:  Procedure Laterality Date   CATARACT EXTRACTION     x 3   EYE SURGERY  1968, 1985, 1987   cataracts   FLEXIBLE SIGMOIDOSCOPY N/A 02/11/2015   Procedure: FLEXIBLE SIGMOIDOSCOPY;  Surgeon: Kenney Peacemaker, MD;  Location: [facility];  Service: Endoscopy;  Laterality: N/A;   INNER EAR SURGERY     rt ear   INNER EAR SURGERY  1992   surgery - right arm     1983    Current Medications: Current Meds   Medication Sig   Accu-Chek Softclix Lancets lancets Use as instructed 1x a day   allopurinol  (ZYLOPRIM ) 100 MG tablet Take 1 tablet (100 mg total) by mouth 2 (two) times daily.   amLODipine  (NORVASC ) 2.5 MG tablet Take 1 tablet (2.5 mg total) by mouth daily. Must keep April's appt for additional refills   azelastine  (ASTELIN ) 0.1 % nasal spray Place 1 spray into both nostrils 2 (two) times daily. Use in each nostril as directed   Azelastine  HCl (ASTEPRO ) 0.15 % SOLN Place 1 spray into the nose in the morning and at bedtime.   BD PEN NEEDLE NANO 2ND GEN 32G X 4 MM MISC USE ONCE DAILY AS DIRECTED   clotrimazole -betamethasone  (LOTRISONE ) cream Apply 1 application  topically as needed for rash.   Continuous Glucose Sensor (FREESTYLE LIBRE 3 PLUS SENSOR) MISC Inject 1 Device into the skin continuous. Change every 15 days   cyclobenzaprine  (FLEXERIL ) 10 MG tablet TAKE 1 TABLET BY MOUTH TWICE A DAY AS NEEDED FOR MUSCLE SPASM   empagliflozin  (JARDIANCE ) 25 MG TABS tablet TAKE 1 TABLET (25 MG TOTAL) BY MOUTH DAILY.   EPINEPHrine  0.3 mg/0.3 mL IJ SOAJ injection Inject 0.3 mg into the muscle as needed for anaphylaxis.   famotidine  (PEPCID ) 40 MG/5ML suspension Take 2.5 mLs (20 mg total) by mouth daily.   FIBER SELECT GUMMIES PO Take 1 tablet by mouth daily.   finasteride  (PROSCAR ) 5 MG tablet Take 5 mg by mouth daily.   fluticasone  (CUTIVATE ) 0.05 % cream Apply 1 application topically 2 (two) times daily.   glucose blood (ACCU-CHEK GUIDE) test strip Use as instructed 1x a day -   HYDROcodone -acetaminophen  (HYCET) 7.5-325 mg/15 ml solution Take 10-15 mLs by mouth every 12 (twelve) hours as needed for moderate pain (pain score 4-6).   hydrocortisone  (ANUSOL -HC) 2.5 % rectal cream PLACE 1 APPLICATION RECTALLY 2 (TWO) TIMES DAILY.   hydrOXYzine  (ATARAX ) 25 MG tablet TAKE 1 TABLET (25 MG TOTAL) BY MOUTH EVERY 8 (EIGHT) HOURS AS NEEDED FOR ANXIETY OR ITCHING.   losartan  (COZAAR ) 100 MG tablet Take 1 tablet (100  mg total) by mouth daily.   metFORMIN  (GLUCOPHAGE ) 500 MG tablet TAKE 1 TABLET BY MOUTH 2 TIMES DAILY WITH A MEAL.   metoprolol  tartrate (LOPRESSOR ) 50 MG tablet Take 1 tablet (50 mg total) by mouth daily.  polyethylene glycol powder (GLYCOLAX /MIRALAX ) 17 GM/SCOOP powder TAKE 17 G BY MOUTH DAILY AS NEEDED.   pravastatin  (PRAVACHOL ) 20 MG tablet TAKE 2 TABLETS (40 MG TOTAL) BY MOUTH DAILY.   pregabalin  (LYRICA ) 100 MG capsule Take 1 capsule (100 mg total) by mouth 2 (two) times daily.   promethazine -dextromethorphan (PROMETHAZINE -DM) 6.25-15 MG/5ML syrup Take 5 mLs by mouth 4 (four) times daily as needed.   Semaglutide , 1 MG/DOSE, (OZEMPIC , 1 MG/DOSE,) 4 MG/3ML SOPN Inject 1 mg into the skin once a week.   tamsulosin  (FLOMAX ) 0.4 MG CAPS capsule Take 0.4 mg by mouth daily.   triamcinolone  (NASACORT ) 55 MCG/ACT AERO nasal inhaler Place 2 sprays into the nose daily.   venlafaxine  XR (EFFEXOR -XR) 75 MG 24 hr capsule TAKE 1 CAPSULE BY MOUTH DAILY WITH BREAKFAST.     Allergies:   Amoxicillin, Sulfa  antibiotics, and Terfenadine   Social History   Socioeconomic History   Marital status: Single    Spouse name: Not on file   Number of children: 0   Years of education: Not on file   Highest education level: 12th grade  Occupational History   Not on file  Tobacco Use   Smoking status: Never   Smokeless tobacco: Never  Vaping Use   Vaping status: Never Used  Substance and Sexual Activity   Alcohol  use: No    Alcohol /week: 0.0 standard drinks of alcohol     Comment: none   Drug use: No   Sexual activity: Never  Other Topics Concern   Not on file  Social History Narrative   No caffeine intake except for chocolate.  Exercised-less walking    Social Drivers of Health   Financial Resource Strain: Medium Risk (02/17/2023)   Overall Financial Resource Strain (CARDIA)    Difficulty of Paying Living Expenses: Somewhat hard  Food Insecurity: Food Insecurity Present (02/17/2023)   Hunger Vital  Sign    Worried About Running Out of Food in the Last Year: Sometimes true    Ran Out of Food in the Last Year: Patient declined  Transportation Needs: Patient Declined (02/17/2023)   PRAPARE - Administrator, Civil Service (Medical): Patient declined    Lack of Transportation (Non-Medical): Patient declined  Physical Activity: Unknown (02/17/2023)   Exercise Vital Sign    Days of Exercise per Week: Patient declined    Minutes of Exercise per Session: Not on file  Stress: Stress Concern Present (02/17/2023)   [employer] of Occupational Health - Occupational Stress Questionnaire    Feeling of Stress : To some extent  Social Connections: Unknown (02/17/2023)   Social Connection and Isolation Panel [NHANES]    Frequency of Communication with Friends and Family: Patient declined    Frequency of Social Gatherings with Friends and Family: Patient declined    Attends Religious Services: Patient declined    Database administrator or Organizations: Patient declined    Attends Engineer, structural: Not on file    Marital Status: Patient declined     Family History: The patient's family history includes Breast cancer (age of onset: 36) in his mother; Heart attack in his father; Heart disease in his father; Huntington's disease in his mother; Hypertension in his brother and father; Stroke in his father. There is no history of Hyperlipidemia or Diabetes.  ROS:   Please see the history of present illness.    All other systems reviewed and are negative.  EKGs/Labs/Other Studies Reviewed:    The following studies were reviewed today: Aaron AasAaron Aas  EKG Interpretation Date/Time:  Tuesday October 13 2023 14:15:23 EDT Ventricular Rate:  94 PR Interval:  128 QRS Duration:  82 QT Interval:  380 QTC Calculation: 475 R Axis:   61  Text Interpretation: Sinus rhythm with frequent and consecutive Premature ventricular complexes Nonspecific ST and T wave abnormality Prolonged QT When compared  with ECG of 12-Feb-2015 05:42, Nonspecific T wave abnormality, worse in Inferior leads Confirmed by Hillis Lu [phone]) on 10/13/2023 2:21:07 PM     Recent Labs: 12/22/2022: Hemoglobin 13.4; Platelets 316.0; TSH 2.47 02/17/2023: ALT 43; BUN 8; Creatinine, Ser 1.06; Potassium 3.6; Sodium 141  Recent Lipid Panel    Component Value Date/Time   CHOL 134 12/22/2022 1445   TRIG 281.0 (H) 12/22/2022 1445   HDL 27.50 (L) 12/22/2022 1445   CHOLHDL 5 12/22/2022 1445   VLDL 56.2 (H) 12/22/2022 1445   LDLCALC 93 06/20/2022 1550   LDLDIRECT 59.0 12/22/2022 1445    Physical Exam:    VS:  BP 122/70   Pulse 94   Ht 5' 9.6" (1.768 m)   Wt 171 lb 1.9 oz (77.6 kg)   SpO2 97%   BMI 24.84 kg/m     Wt Readings from Last 3 Encounters:  10/13/23 171 lb 1.9 oz (77.6 kg)  10/08/23 169 lb 12.8 oz (77 kg)  09/04/23 173 lb 9.6 oz (78.7 kg)     GEN: Patient is in no acute distress HEENT: Normal NECK: No JVD; No carotid bruits LYMPHATICS: No lymphadenopathy CARDIAC: Hear sounds regular, 2/6 systolic murmur at the apex. RESPIRATORY:  Clear to auscultation without rales, wheezing or rhonchi  ABDOMEN: Soft, non-tender, non-distended MUSCULOSKELETAL:  No edema; No deformity  SKIN: Warm and dry NEUROLOGIC:  Alert and oriented x 3 PSYCHIATRIC:  Normal affect   Signed, Nelia Balzarine, MD  10/13/2023 2:32 PM    [facility]

## 2023-10-13 NOTE — Telephone Encounter (Signed)
 The appeal for Freestyle Livre 3 Sensors was denied:

## 2023-10-13 NOTE — Patient Instructions (Signed)
 Medication Instructions:  Your physician recommends that you continue on your current medications as directed. Please refer to the Current Medication list given to you today.  *If you need a refill on your cardiac medications before your next appointment, please call your pharmacy*  Lab Work: None If you have labs (blood work) drawn today and your tests are completely normal, you will receive your results only by: MyChart Message (if you have MyChart) OR A paper copy in the mail If you have any lab test that is abnormal or we need to change your treatment, we will call you to review the results.  Testing/Procedures: None  Follow-Up: At [facility], you and your health needs are our priority.  As part of our continuing mission to provide you with exceptional heart care, our providers are all part of one team.  This team includes your primary Cardiologist (physician) and Advanced Practice Providers or APPs (Physician Assistants and Nurse Practitioners) who all work together to provide you with the care you need, when you need it.  Your next appointment:   9 month(s)  Provider:   Belva Crome, MD    We recommend signing up for the patient portal called "MyChart".  Sign up information is provided on this After Visit Summary.  MyChart is used to connect with patients for Virtual Visits (Telemedicine).  Patients are able to view lab/test results, encounter notes, upcoming appointments, etc.  Non-urgent messages can be sent to your provider as well.   To learn more about what you can do with MyChart, go to [email or web address].   Other Instructions None

## 2023-10-14 NOTE — Telephone Encounter (Signed)
 Pt has been notified and voices understanding.

## 2023-10-17 ENCOUNTER — Other Ambulatory Visit: Payer: Self-pay | Admitting: Family Medicine

## 2023-10-17 DIAGNOSIS — I10 Essential (primary) hypertension: Secondary | ICD-10-CM

## 2023-10-19 ENCOUNTER — Encounter: Attending: Physical Medicine & Rehabilitation | Admitting: Physical Medicine & Rehabilitation

## 2023-10-19 ENCOUNTER — Encounter: Payer: Self-pay | Admitting: Physical Medicine & Rehabilitation

## 2023-10-19 VITALS — BP 141/80 | HR 84 | Ht 69.5 in | Wt 174.4 lb

## 2023-10-19 DIAGNOSIS — Z79899 Other long term (current) drug therapy: Secondary | ICD-10-CM | POA: Diagnosis not present

## 2023-10-19 DIAGNOSIS — M545 Low back pain, unspecified: Secondary | ICD-10-CM | POA: Insufficient documentation

## 2023-10-19 DIAGNOSIS — Z5181 Encounter for therapeutic drug level monitoring: Secondary | ICD-10-CM | POA: Insufficient documentation

## 2023-10-19 DIAGNOSIS — G8929 Other chronic pain: Secondary | ICD-10-CM | POA: Insufficient documentation

## 2023-10-19 DIAGNOSIS — M797 Fibromyalgia: Secondary | ICD-10-CM | POA: Insufficient documentation

## 2023-10-19 DIAGNOSIS — G894 Chronic pain syndrome: Secondary | ICD-10-CM | POA: Diagnosis not present

## 2023-10-19 MED ORDER — HYDROCODONE-ACETAMINOPHEN 7.5-325 MG/15ML PO SOLN
10.0000 mL | Freq: Three times a day (TID) | ORAL | 0 refills | Status: DC | PRN
Start: 2023-10-19 — End: 2023-12-07

## 2023-10-19 MED ORDER — NALOXONE HCL 4 MG/0.1ML NA LIQD: NASAL | 1 refills | Status: AC

## 2023-10-19 NOTE — Progress Notes (Signed)
 Subjective:    Patient ID: Chase [name], male    DOB: Apr 03, 1961, 63 y.o.   MRN: [id number]  HPI Chase [name] is a 63 y.o. 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) (03/12/2018), Cerebrovascular accident, old (11/16/2014), Chest discomfort (06/08/2019), Chronic back pain (11/28/2020), Chronic cough (08/27/2014), Chronic left shoulder pain (08/06/2020), Chronic pain syndrome (11/23/2019), Chronic pansinusitis (06/30/2018), Chronic prostatitis (12/06/2013), 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), Constipation, 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), DDD (degenerative disc disease), cervical (04/26/2019), DDD (degenerative disc disease), lumbar (04/26/2019), Decreased hearing of both ears (02/12/2013), Deviated septum (05/31/2018), Diabetes mellitus without complication (HCC), Difficulty urinating, DM (diabetes mellitus) type II uncontrolled, periph vascular disorder (01/02/2016), Dry skin (05/09/2019), Dysuria (11/29/2010), Essential (primary) hypertension (01/26/2015), Excessive daytime sleepiness (02/11/2018), FATIGUE (12/09/2007), Fibromyalgia (03/02/2017), Food intolerance (05/09/2019), GERD (12/09/2007), GERD (gastroesophageal reflux  disease), GI bleed (02/10/2015), Headache(784.0), Hearing loss, 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/12/2013), Hemorrhoid (05/22/2011), HTN (hypertension) (02/04/2010), Hyperlipidemia, Hyperlipidemia associated with type 2 diabetes mellitus (HCC) (11/22/2020), Hyperlipidemia LDL goal <70 (01/26/2015), Hypersomnia, persistent (11/05/2012), Hypertension, Hypokalemia (08/29/2020), Hypotension (07/04/2020), 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), 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), Increasing residual urine (03/10/2022), Insect bite (03/16/2018), Insomnia (08/05/2011), Internal and external hemorrhoids without complication (10/04/2012), Internal hemorrhoids (10/22/2014), 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), Leg swelling, Localized swelling of both lower legs (03/02/2017), Low back pain (07/20/2007), Lower extremity edema (07/04/2020), Lower GI bleed, Melena (12/16/2021), Mixed conductive and sensorineural hearing loss of right ear with restricted hearing of left ear (10/16/2020), Nasal congestion, Neck pain (03/15/2020), NEPHROLITHIASIS (08/13/2006), Nocturia more than twice per night (02/11/2018), OSA on CPAP (10/17/2014), Otalgia of both ears (06/06/2021), Other chronic pain (03/02/2017), Other fatigue (07/04/2020), Pain in finger of right hand (10/06/2012), Pain in joint of right shoulder (02/21/2017), Palpitations (01/26/2015), Paresthesia (07/25/2010), Persistent atrial fibrillation (HCC) (08/29/2020), Persistent headaches (10/17/2014), 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), PONV (postoperative nausea and vomiting), Preoperative clearance (07/04/2020), Prolapsed hemorrhoids (10/05/2013), Radiculopathy, lumbar region (03/15/2020), Rash (05/24/2019), Rash and other nonspecific skin eruption (07/23/2022), Raynaud's disease (03/02/2017), Raynaud's phenomenon (03/02/2017), Rectal bleeding (02/12/2015), Rectal pain, Rectal ulcer with bleeding after hemorrhoid banding (02/11/2015), Rhinitis, chronic  (12/23/2006), Right shoulder pain (02/15/2015), Seasonal and perennial allergic rhinitis (05/09/2019), Severe episode of recurrent major depressive disorder, without psychotic features (HCC) (08/29/2020), Sinus infection (06/30/2014), Sinusitis, acute, maxillary (02/02/2014), Sleep apnea, Sleep apnea with use of continuous positive airway pressure (CPAP) (02/11/2018), SOB (shortness of breath) (07/04/2020), Stroke (HCC), Stroke (HCC), Subacute confusional state (10/17/2014), SYMPTOM, HYPERSOMNIA NOS (02/19/2007), Thrombosed hemorrhoids, Tick bite of back (11/23/2019), Tinnitus of both ears (08/29/2020), Trouble swallowing, Unspecified hemorrhoids (02/10/2015), Unspecified sleep apnea (11/05/2012), UTI (urinary tract infection) (05/19/2022), Varicose veins of lower extremities with other complications (08/10/2012), Vasomotor rhinitis (06/28/2021), and Vertigo (03/19/2012).   They are presenting to PM&R clinic as a new patient for pain management evaluation. They were referred by Dr. Crecencio Dodge for treatment of chronic pain.    Chase [name] reports he has had chronic pain for about 10  years.  His speech is limited due to history of CVAs with aphasia.  Patient has neck pain with pain affecting his bilateral arms.  He also has lower back pain and pain in his legs.  He also has burning, pins-and-needles and tingling pain in his feet, suspected to be due to diabetes mellitus.  Walking and standing worsens his pain.  Pending backwards is also painful.  Patient indicates he has pain throughout his entire body.  He reports he was previously followed by Dr. Mona Angle for his pain.  Patient reports he had injections in his back or neck and is unsure if this is beneficial.  He indicates he was later followed by other doctors that were prescribing him medications.  He has trouble swallowing pills so is taking liquid hydrocodone .  He says this medication is helping keep his pain under control.  He also takes gabapentin  100 mg 3 times  daily which she feels like is beneficial to the pain in his feet however he does not keep it controlled.   Chart review indicates he was previously diagnosed with fibromyalgia.   He reports history of Raynaud's phenomena   Patient is a poor historian and limitations due to aphasia         Red flag symptoms: No red flags for back pain endorsed in Hx or ROS   Medications tried:   Nsaids - limited by comorbidities  Tylenol  - helps Opiates  hydrocodone   Gabapentin - TID 100mg ,helping  TCAs  -denies, doesn't recall  SNRIs  - effexor  helps with mood      Other treatments: PT- helped in the past  Chiropractor TENs unit - Injections - back or neck injections not sure if this helped  Surgery- denies    Goals for pain control: Decrease overall body wide pain       Interval History 02/26/23 Mr. Grosser is here for follow-up regarding his back and neck pain, pain related to peripheral neuropathy, fibromyalgia.  He reports that pain is largely unchanged from last visit.  He has not consistently tried taking gabapentin  200 mg 3 times daily and has mostly been taking it at the 100 mg dose.  He has been having more pain in his left great toe, being treated with antibiotics by his PCP.  He has been walking with physical therapy and feels like this has been helping his balance and strength.  Physical therapy has also had a little benefit to his pain.  He reports he forgot to get records sent over from his prior pain clinic.  He says he stopped going there because they started doing more procedure treatments.  He says they referred him to a different provider but he did not follow-up on this.  He reports his pain was better controlled when he was taking the hydrocodone  liquid.  He was able to walk a lot farther and do a lot more at home.   Interval History 03/26/23 Patient is here for follow-up regarding his chronic pain.  Pain is greatest in his neck, back, legs.  Patient does think that Lyrica  has  been beneficial, not having any side effects at this time.  He continues to be frustrated by his aphasia, limiting his ability to communicate.  Interval History 04/23/2023 Mr. Corriea is here for follow-up regarding his chronic body wide pain.  Pain is the worst in his neck, lower back and legs.  Patient does feel that Lyrica  is reducing his pain, however not keeping it under good control.  Patient  had insurance issues with the pharmacy and was unable to pick up his hydrocodone  liquid, all of this appears to be resolved and pharmacy is ordering the medication for pickup.  Mood continues to be decreased and he feels depressed at times.  No SI or HI.  Interval History 06/25/23 Patient continues to have severe pain in his back and neck.  He also has milder pain in his arms and legs.  Patient reports benefit with Lyrica , feels like it is helping to reduce the pain in his legs. Patient reports liquid hydrocodone  liquid 10 mL providing mild benefit to his pain.patient. Reports it worked much better in the past when he used 15 ml dose.   Patient is difficult historian due to expressive aphasia.  Interval History 07/22/2022 Patient reports continued severe pain throughout his whole back.  The next worst area of pain as throughout his legs.  He also has severe pain in his neck and shoulders.  Hydrocodone  liquid is helping, he is using it sparingly when pain is only very severe.  He continues to use Lyrica  75 mg twice daily.  Reports he is tolerating both of these medications.Occasional constipation controlled with laxatives.  Ontinues to have pins-and-needles pain in his feet.   Interval History 09/04/22 Patient reports continued pain throughout his whole back, worse in the lumbar spine.  Reports also having pain in his hands, shoulders, feet.  Hydrocodone  is helping reduce the back pain and when it is severe.  He is also taking Lyrica  which is helping his overall body wide pain.  Patient reports he tried 1 or 2  sessions of aquatic therapy but stopped going-lack of motivation.  He is willing to try this again.  Communication more difficult due to chronic aphasia.  Interval History 10/19/23 Pt has continued pain in multiple area of his body but it is worse in his lower back. Also a lot of pain in his bilateral shoulders and R wrist.  Patient has not yet started physical therapy, reports he was busy recently.  Hydrocodone  liquid has been helping reduce his pain, often does not last long enough.  This medication has been helping him be more active at home and complete chores around the house.  Pain Inventory Average Pain 7 Pain Right Now 7 My pain is intermittent, constant, sharp, burning, dull, stabbing, tingling, and aching  In the last 24 hours, has pain interfered with the following? General activity 7 Relation with others 8 Enjoyment of life 7 What TIME of day is your pain at its worst? morning , daytime, evening, and night Sleep (in general) Fair  Pain is worse with: walking, bending, sitting, standing, and some activites Pain improves with: Rest, Medication Relief from Meds: 7  Family History  Problem Relation Age of Onset   Breast cancer Mother 38       breast   Huntington's disease Mother    Stroke Father    Heart disease Father    Hypertension Father    Heart attack Father    Hypertension Brother    Hyperlipidemia Neg Hx    Diabetes Neg Hx    Social History   Socioeconomic History   Marital status: Single    Spouse name: Not on file   Number of children: 0   Years of education: Not on file   Highest education level: 12th grade  Occupational History   Not on file  Tobacco Use   Smoking status: Never   Smokeless tobacco: Never  Vaping Use  Vaping status: Never Used  Substance and Sexual Activity   Alcohol  use: No    Alcohol /week: 0.0 standard drinks of alcohol     Comment: none   Drug use: No   Sexual activity: Never  Other Topics Concern   Not on file  Social  History Narrative   No caffeine intake except for chocolate.  Exercised-less walking    Social Drivers of Health   Financial Resource Strain: Medium Risk (02/17/2023)   Overall Financial Resource Strain (CARDIA)    Difficulty of Paying Living Expenses: Somewhat hard  Food Insecurity: Food Insecurity Present (02/17/2023)   Hunger Vital Sign    Worried About Running Out of Food in the Last Year: Sometimes true    Ran Out of Food in the Last Year: Patient declined  Transportation Needs: Patient Declined (02/17/2023)   PRAPARE - Administrator, Civil Service (Medical): Patient declined    Lack of Transportation (Non-Medical): Patient declined  Physical Activity: Unknown (02/17/2023)   Exercise Vital Sign    Days of Exercise per Week: Patient declined    Minutes of Exercise per Session: Not on file  Stress: Stress Concern Present (02/17/2023)   [employer] of Occupational Health - Occupational Stress Questionnaire    Feeling of Stress : To some extent  Social Connections: Unknown (02/17/2023)   Social Connection and Isolation Panel [NHANES]    Frequency of Communication with Friends and Family: Patient declined    Frequency of Social Gatherings with Friends and Family: Patient declined    Attends Religious Services: Patient declined    Database administrator or Organizations: Patient declined    Attends Engineer, structural: Not on file    Marital Status: Patient declined   Past Surgical History:  Procedure Laterality Date   CATARACT EXTRACTION     x 3   EYE SURGERY  1968, 1985, 1987   cataracts   FLEXIBLE SIGMOIDOSCOPY N/A 02/11/2015   Procedure: FLEXIBLE SIGMOIDOSCOPY;  Surgeon: Kenney Peacemaker, MD;  Location: [facility];  Service: Endoscopy;  Laterality: N/A;   INNER EAR SURGERY     rt ear   INNER EAR SURGERY  1992   surgery - right arm     1983   Past Surgical History:  Procedure Laterality Date   CATARACT EXTRACTION     x 3   EYE SURGERY  1968, 1985,  1987   cataracts   FLEXIBLE SIGMOIDOSCOPY N/A 02/11/2015   Procedure: FLEXIBLE SIGMOIDOSCOPY;  Surgeon: Kenney Peacemaker, MD;  Location: [facility];  Service: Endoscopy;  Laterality: N/A;   INNER EAR SURGERY     rt ear   INNER EAR SURGERY  1992   surgery - right arm     1983   Past Medical History:  Diagnosis Date   Abnormality of gait 11/10/2012   Acute GI bleeding    Acute renal insufficiency 02/10/2015   Acute upper respiratory infection 08/10/2012   Allergic drug reaction 05/24/2020   Allergic rhinitis 10/16/2020   Allergy    ANXIETY DEPRESSION 12/09/2007   Qualifier: Diagnosis of  By: Gardenia Jump RN, Glena Landau    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) 03/12/2018   Cerebrovascular accident, old 11/16/2014   Chest discomfort 06/08/2019   Chronic back pain 11/28/2020   Chronic cough 08/27/2014   CXR 08/2014:  Mild hyperinflation, no acute process Spiro 09/2014:  Difficulty with procedure, but essentially normal +CVA's in past, with aspiration risk by history Speech evaluation 2016:      Chronic left shoulder pain 08/06/2020   Chronic pain syndrome 11/23/2019   Chronic pansinusitis 06/30/2018   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   Constipation    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   DDD (degenerative disc disease), cervical 04/26/2019   DDD (degenerative disc disease), lumbar 04/26/2019   Decreased hearing of both ears 02/12/2013   Deviated septum 05/31/2018   Diabetes mellitus without complication (HCC)    Difficulty urinating     DM (diabetes mellitus) type II uncontrolled, periph vascular disorder 01/02/2016   Dry skin 05/09/2019   Dysuria 11/29/2010   Essential (primary) hypertension 01/26/2015   Excessive daytime sleepiness 02/11/2018   FATIGUE 12/09/2007   Qualifier: Diagnosis of  By: Toi Foster    Fibromyalgia 03/02/2017   Food intolerance 05/09/2019   GERD 12/09/2007   Qualifier: Diagnosis of  By: Kingsley Penny MD, Darlene Ehlers    GERD (gastroesophageal reflux disease)    GI bleed 02/10/2015   Headache 12/23/2006   Qualifier: Diagnosis of   By: Toi Foster     IMO SNOMED Dx Update Oct 2024     Hearing loss    Hemangioma 02/12/2013   Hemorrhoid 05/22/2011   HTN (hypertension) 02/04/2010   Qualifier: Diagnosis of  By: Rodrick Clapper MD, Stacey     Hyperlipidemia    Hyperlipidemia associated with type 2 diabetes mellitus (HCC) 11/22/2020   Hyperlipidemia LDL goal <70 01/26/2015   Hypersomnia, persistent 11/05/2012   epworth of 18 points, was placed on CPAP in 2008 with very low AHI ( no number  Mentioned) and struggles ever since with PAP. 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   Leg swelling    Localized swelling of both lower legs 03/02/2017   Low back pain 07/20/2007   Qualifier: Diagnosis of  By: Kingsley Penny MD, Darlene Ehlers    Lower extremity edema 07/04/2020   Lower GI bleed    Melena 12/16/2021   Mixed conductive and sensorineural hearing loss of right ear with restricted hearing of left ear 10/16/2020   Nasal congestion    Neck pain 03/15/2020   NEPHROLITHIASIS 08/13/2006   Qualifier: Diagnosis of  By: Alen Amy      Nocturia more than twice per night 02/11/2018   OSA on CPAP 10/17/2014   Otalgia of both ears 06/06/2021   Other chronic pain 03/02/2017   Other fatigue 07/04/2020   Pain in finger of right hand 10/06/2012   Pain in joint of right shoulder 02/21/2017   Palpitations 01/26/2015   Paresthesia 07/25/2010   Persistent atrial fibrillation (HCC) 08/29/2020   Persistent headaches 10/17/2014   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  Qualifier: Diagnosis of  By: Kingsley Penny MD, Cindie Credit R    PONV (postoperative nausea and vomiting)    Preoperative clearance 07/04/2020   Preventative health care 11/26/2011   Prolapsed hemorrhoids 10/05/2013   Radiculopathy, lumbar region 03/15/2020   Rash 05/24/2019   Rash and other nonspecific skin eruption 07/23/2022   Raynaud's disease 03/02/2017   Raynaud's phenomenon 03/02/2017   Rectal bleeding 02/12/2015   Rectal pain    Rectal ulcer with bleeding after hemorrhoid banding 02/11/2015   Rhinitis, chronic 12/23/2006   Qualifier: Diagnosis of  By: Gardenia Jump RN, Regina G    Right shoulder pain 02/15/2015   Seasonal and perennial allergic rhinitis 05/09/2019   Severe episode of recurrent major depressive disorder, 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   Sinus infection 06/30/2014   Sinusitis, acute, maxillary 02/02/2014   Sleep apnea    Sleep apnea with use of continuous positive airway pressure (CPAP) 02/11/2018   SOB (shortness of breath) 07/04/2020   Stroke (HCC)    Stroke (HCC)    Subacute confusional state 10/17/2014   SYMPTOM, HYPERSOMNIA NOS 02/19/2007   Qualifier: Diagnosis of  By: Cala Castleman     Thrombosed hemorrhoids    Tick bite of back 11/23/2019   Tinnitus of both ears 08/29/2020   Trouble swallowing    Unspecified hemorrhoids 02/10/2015   UTI (urinary tract infection) 05/19/2022   Varicose veins of bilateral lower extremities with other complications 08/10/2012   IMO SNOMED Dx Update Oct 2024     Vasomotor rhinitis 06/28/2021    Vertigo 03/19/2012   BP (!) 141/80   Pulse 84   Ht 5' 9.5" (1.765 m)   Wt 174 lb 6.4 oz (79.1 kg)   SpO2 97%   BMI 25.39 kg/m   Opioid Risk Score:   Fall Risk Score:  `1  Depression screen [facility] 2/9     10/19/2023    3:25 PM 09/04/2023    3:46 PM 07/23/2023    2:10 PM 06/25/2023    2:54 PM 04/23/2023    3:16 PM 03/26/2023    3:11 PM 01/15/2023    3:06 PM  Depression screen PHQ 2/9  Decreased Interest  2 1  2  0 2  Down, Depressed, Hopeless  1 1  2  0 2  PHQ - 2 Score  3 2  4  0 4  Altered sleeping 2 2  2   3   Tired, decreased energy 2 2  2   2   Change in appetite  1     2  Feeling bad or failure about yourself   2     2  Trouble concentrating  2     2  Moving slowly or fidgety/restless  3     3  Suicidal thoughts  0     0  PHQ-9 Score  15     18  Difficult doing work/chores  Somewhat difficult     Somewhat difficult      Review of Systems  Constitutional: Negative.   HENT:  Negative for voice change.   Eyes:  Positive for visual disturbance.  Respiratory: Negative.    Cardiovascular: Negative.   Gastrointestinal: Negative.   Endocrine: Negative.   Genitourinary: Negative.   Musculoskeletal:  Positive for back pain, gait problem and neck pain. Negative for neck stiffness.       Pain in both shoulders, Feet, legs, arms  Skin: Negative.   Allergic/Immunologic: Negative.   Neurological:  Positive for dizziness, weakness, light-headedness and headaches.  Weakness in both hands  Hematological: Negative.   Psychiatric/Behavioral:  Positive for dysphoric mood.   All other systems reviewed and are negative.      Objective:   Physical Exam  Gen: no distress, normal appearing HEENT: oral mucosa pink and moist, NCAT Chest: normal effort, normal rate of breathing Abd: soft, non-distended Ext: no edema Psych: Frustrated with his language deficits skin: Warm and dry Neuro: Alert and awake, follows commands, cranial nerves II through XII grossly intact Patient has  significant aphasia, primarily expressive Sensory exam normal for light touch in all 4 limbs-intact to light touch in both feet throughout   Musculoskeletal:  Kyphotic posture Pain with spinal extension  Facet loading equivocal  Patient with mild diffuse tenderness to palpation throughout bilateral upper and lower extremities Paraspinal tenderness L-spine, T-spine, C-spine greatest at lumbar spine Slump test negative   Lumbar spine MRI 10/19/23 IEXAM: MRI LUMBAR SPINE WITHOUT CONTRAST   TECHNIQUE: Multiplanar, multisequence MR imaging of the lumbar spine was performed. No intravenous contrast was administered.   COMPARISON:  MRI of the lumbar spine dated 06/04/2021   FINDINGS: Segmentation: Standard.   Alignment:  Physiologic lumbar alignment is maintained.   Vertebrae: Vertebral bodies demonstrate normal signal intensity. No compression fractures.   Conus medullaris and cauda equina: The conus medullaris terminates at the level of L1-L2. The distal spinal cord signal intensity is normal. Stable intradural lesion at the level of L2 measuring approximately 1 cm.   Paraspinal and other soft tissues: Right renal cysts. The visualized aorta is normal.   Disc levels:   L1-L2: Disc is normal in configuration. No facet arthropathy. No neuroforaminal stenosis. No spinal canal stenosis.   L2-L3: Disc is normal in configuration. Moderate bilateral facet arthropathy. No neuroforaminal stenosis. No spinal canal stenosis.   L3-L4: Disc bulge. Moderate bilateral facet arthropathy. Mild bilateral neuroforaminal stenosis. Mild spinal canal stenosis.   L4-L5: Disc bulge. Moderate bilateral facet arthropathy. Moderate bilateral neuroforaminal stenosis. Mild spinal canal stenosis.   L5-S1: Disc bulge. Moderate bilateral facet arthropathy. Mild bilateral neuroforaminal stenosis. No spinal canal stenosis.   IMPRESSION: 1. Mild canal stenoses at L3-L4 and L4-L5 secondary to disc  bulging and facet arthropathy. 2. Moderate foraminal stenosis bilaterally at L4-L5. 3. 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  Denies SI or HI 5) History of CVA.  Patient continues to have aphasia   Plan 1) Dc Gabapentin  previously, Continue Lyrica  100 mg twice daily.  He denies any side effects with current regimen 2) Previously on liquid hydrocodone , Continue Hycet 7.5/15ml, 10-15ml Increase to Q8H PRN from Q12h PRN.  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

## 2023-10-20 ENCOUNTER — Telehealth: Payer: Self-pay

## 2023-10-20 ENCOUNTER — Other Ambulatory Visit: Payer: Self-pay | Admitting: Cardiology

## 2023-10-20 NOTE — Telephone Encounter (Signed)
 PA SUBMITTED FOR HYDROCODONE  10-15 ML ON 10/20/2023 IN COVER MY MEDS.

## 2023-10-21 ENCOUNTER — Encounter: Payer: Self-pay | Admitting: Physical Medicine & Rehabilitation

## 2023-10-21 ENCOUNTER — Other Ambulatory Visit: Payer: Self-pay | Admitting: Family Medicine

## 2023-10-21 LAB — DRUG TOX MONITOR 1 W/CONF, ORAL FLD

## 2023-10-21 LAB — DRUG TOX ALC METAB W/CON, ORAL FLD: Alcohol Metabolite: NEGATIVE ng/mL (ref <25)

## 2023-10-21 NOTE — Telephone Encounter (Signed)
 Outcome Approved on May 6 by [facility] [employer] 2017 [place] Request Reference Number: [id number]. HYDROCO/APAP SOL 7.5-325 is approved through 04/21/2024. For further questions, call [employer] at [phone].

## 2023-10-22 ENCOUNTER — Telehealth: Payer: Self-pay | Admitting: Physical Medicine & Rehabilitation

## 2023-10-22 ENCOUNTER — Telehealth: Payer: Self-pay | Admitting: *Deleted

## 2023-10-22 NOTE — Telephone Encounter (Signed)
 Needs a prior auth per the pharmacy.

## 2023-10-22 NOTE — Telephone Encounter (Signed)
 Prior auth for hydrocodone  acetaminophen  7.5/325/15 ml #1200 ml initiated with [employer] via [employer].

## 2023-10-22 NOTE — Telephone Encounter (Signed)
 Pharmacy calling to clarify dosage on hydrocodone  (liquid form). Insurance will only pay for a 5 day supply at one time. They can be reached at [phone].

## 2023-10-26 NOTE — Telephone Encounter (Signed)
 Rx approved  until 04/21/2024 per Fax.

## 2023-10-29 ENCOUNTER — Ambulatory Visit
Admission: RE | Admit: 2023-10-29 | Discharge: 2023-10-29 | Disposition: A | Discharge status: Home / Self Care | Source: Ambulatory Visit | Attending: Family Medicine | Admitting: Family Medicine

## 2023-10-29 ENCOUNTER — Other Ambulatory Visit: Admitting: Radiology

## 2023-10-29 ENCOUNTER — Ambulatory Visit: Admitting: Radiology

## 2023-10-29 VITALS — BP 131/81 | HR 81 | Temp 97.9°F | Resp 17

## 2023-10-29 DIAGNOSIS — W19XXXA Unspecified fall, initial encounter: Secondary | ICD-10-CM

## 2023-10-29 DIAGNOSIS — M79641 Pain in right hand: Secondary | ICD-10-CM | POA: Diagnosis not present

## 2023-10-29 DIAGNOSIS — S80861A Insect bite (nonvenomous), right lower leg, initial encounter: Secondary | ICD-10-CM | POA: Diagnosis not present

## 2023-10-29 DIAGNOSIS — W57XXXA Bitten or stung by nonvenomous insect and other nonvenomous arthropods, initial encounter: Secondary | ICD-10-CM

## 2023-10-29 DIAGNOSIS — H6993 Unspecified Eustachian tube disorder, bilateral: Secondary | ICD-10-CM

## 2023-10-29 MED ORDER — LORATADINE 10 MG PO TABS: 10.0000 mg | ORAL_TABLET | Freq: Every day | ORAL | 0 refills | Status: AC

## 2023-10-29 MED ORDER — DOXYCYCLINE HYCLATE 100 MG PO TABS: 100.0000 mg | ORAL_TABLET | Freq: Two times a day (BID) | ORAL | 0 refills | Status: AC

## 2023-10-29 NOTE — Discharge Instructions (Addendum)
 Your ear pain and popping are likely due to fluid level behind both of your eardrums. Use 2 puffs of Flonase  daily to reduce inflammation and swelling behind your eardrums.  Take doxycycline  antibiotic every 12 hours (twice daily) with food for the next 10 days due to infected tick bite.  I have called [facility] to confirm that you are able to crush the doxycycline  tablets into large pieces and placed the pieces onto applesauce, then swallow the medication.  Watch for new or worsening signs of infection to the right leg such as redness, swelling, pus from the site, or worsening pain/fever.  Your x-ray of your right hand is normal and does not show any broken bones. Take Tylenol  1000 mg every 6 hours as needed for pain and swelling of the right hand.  If you develop any new or worsening symptoms or if your symptoms do not start to improve, please return here or follow-up with your primary care provider. If your symptoms are severe, please go to the emergency room.

## 2023-10-29 NOTE — ED Triage Notes (Signed)
 Pt states he injured right hand on 10/27/23 while playing with dog and hit hand on ground. Hand has been swollen and painful . Swelling is better today but still hurts a little   Also would like his right lower leg looked after he removed tick today .  Also c/o ear fullness and popping for 2 weeks.

## 2023-10-29 NOTE — ED Provider Notes (Signed)
 [name]    CSN: [id number] Arrival date & time: 10/29/23  1714      History   Chief Complaint Chief Complaint  Patient presents with   Hand Problem    Injured right hand, it swelled up with a large lump on it and was very painful. Still sore from 2 days ago. Hope nothing is broken inside. 2nd I took a tick off of my leg today and have it with me in a medicine bottle. Ear fullness/Pain/popping. - Entered by patient    HPI Chase [name] is a 63 y.o. male.   Chase [name] is a 63 y.o. male presenting for chief complaint of multiple complaints.   He complains of pain to the ulnar portion of the right hand after hitting his hand on the ground while playing with his dog 2 days ago. There is a small bruise to the dorsal ulnar portion of the right hand, otherwise no swelling. Pain has improved significantly since initial injury. Denies previous injury to the right hand, numbness/tingling distally. He only hit his hand during the "fall" and did not hit his head or lose consciousness.   Additionally reports bilateral ear pain that started approximately 2 weeks ago. Reports slightly decreased hearing from both ears as well as "popping" sensation to both ears. Denies drainage from the ears, tinnitus, viral uri symptoms, dizziness, headache, N/V, fever, chills, and recent trauma to the ears.   He would also like evaluation of possible infected tick bite to the back of the right leg. He pulled a tick off of his his right calf today and states the tick was engorged when he pulled it off. He's not sure how long the tick had been on his skin and states he feels he was able to remove the tick in its entirety. He denies fever, chills, body aches, nausea, vomiting, and pain to the site of the tick bite. The site of the bite is red, swollen, and warm. Denies recent antibiotic or steroid use.      Past Medical History:  Diagnosis Date   Abnormality of gait 11/10/2012   Acute GI bleeding     Acute renal insufficiency 02/10/2015   Acute upper respiratory infection 08/10/2012   Allergic drug reaction 05/24/2020   Allergic rhinitis 10/16/2020   Allergy    ANXIETY DEPRESSION 12/09/2007   Qualifier: Diagnosis of  By: Gardenia Jump RN, Glena Landau    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) 03/12/2018   Cerebrovascular accident, old 11/16/2014   Chest discomfort 06/08/2019   Chronic back pain 11/28/2020   Chronic cough 08/27/2014   CXR 08/2014:  Mild hyperinflation, no acute process Spiro 09/2014:  Difficulty with procedure, but essentially normal +CVA's in past, with aspiration risk by history Speech evaluation 2016:      Chronic left shoulder pain 08/06/2020   Chronic pain syndrome 11/23/2019   Chronic pansinusitis 06/30/2018   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   Constipation    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  DDD (degenerative disc disease), cervical 04/26/2019   DDD (degenerative disc disease), lumbar 04/26/2019   Decreased hearing of both ears 02/12/2013   Deviated septum 05/31/2018   Diabetes mellitus without complication (HCC)    Difficulty urinating    DM (diabetes mellitus) type II uncontrolled, periph vascular disorder 01/02/2016   Dry skin 05/09/2019   Dysuria 11/29/2010   Essential (primary) hypertension 01/26/2015   Excessive daytime sleepiness 02/11/2018   FATIGUE 12/09/2007   Qualifier: Diagnosis of  By: Toi Foster    Fibromyalgia 03/02/2017   Food intolerance 05/09/2019   GERD 12/09/2007    Qualifier: Diagnosis of  By: Kingsley Penny MD, Darlene Ehlers    GERD (gastroesophageal reflux disease)    GI bleed 02/10/2015   Headache 12/23/2006   Qualifier: Diagnosis of   By: Toi Foster     IMO SNOMED Dx Update Oct 2024     Hearing loss    Hemangioma 02/12/2013   Hemorrhoid 05/22/2011   HTN (hypertension) 02/04/2010   Qualifier: Diagnosis of  By: Rodrick Clapper MD, Stacey     Hyperlipidemia    Hyperlipidemia associated with type 2 diabetes mellitus (HCC) 11/22/2020   Hyperlipidemia LDL goal <70 01/26/2015   Hypersomnia, persistent 11/05/2012   epworth of 18 points, was placed on CPAP in 2008 with very low AHI ( no number  Mentioned) and struggles ever since with PAP. 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   Leg swelling    Localized swelling of both lower legs 03/02/2017   Low back pain 07/20/2007   Qualifier: Diagnosis of  By: Kingsley Penny MD, Darlene Ehlers    Lower extremity edema 07/04/2020   Lower GI bleed    Melena 12/16/2021   Mixed conductive and sensorineural hearing loss of right ear with restricted hearing of left ear 10/16/2020   Nasal congestion    Neck pain 03/15/2020   NEPHROLITHIASIS 08/13/2006   Qualifier: Diagnosis of  By: Alen Amy     Nocturia more than twice per night 02/11/2018   OSA on CPAP 10/17/2014   Otalgia of both ears 06/06/2021   Other chronic pain 03/02/2017   Other fatigue 07/04/2020   Pain in finger of right hand 10/06/2012   Pain in joint of right shoulder 02/21/2017   Palpitations 01/26/2015   Paresthesia 07/25/2010   Persistent atrial fibrillation (HCC) 08/29/2020    Persistent headaches 10/17/2014   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   Qualifier: Diagnosis of  By: Kingsley Penny MD, Cindie Credit R    PONV (postoperative nausea and vomiting)    Preoperative clearance 07/04/2020   Preventative health care 11/26/2011   Prolapsed hemorrhoids 10/05/2013   Radiculopathy, lumbar region 03/15/2020   Rash 05/24/2019   Rash and other nonspecific skin eruption 07/23/2022   Raynaud's disease 03/02/2017   Raynaud's phenomenon 03/02/2017   Rectal bleeding 02/12/2015   Rectal pain    Rectal ulcer with bleeding after hemorrhoid banding 02/11/2015   Rhinitis, chronic 12/23/2006   Qualifier: Diagnosis of  By: Gardenia Jump RN, Regina G    Right shoulder pain 02/15/2015   Seasonal and perennial allergic rhinitis 05/09/2019   Severe episode of recurrent  major depressive disorder, 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   Sinus infection 06/30/2014   Sinusitis, acute, maxillary 02/02/2014   Sleep apnea    Sleep apnea with use of continuous positive airway pressure (CPAP) 02/11/2018   SOB (shortness of breath) 07/04/2020   Stroke (HCC)    Stroke [facility])    Subacute confusional state 10/17/2014   SYMPTOM, HYPERSOMNIA NOS 02/19/2007   Qualifier: Diagnosis of  By: Cala Castleman     Thrombosed hemorrhoids    Tick bite of back 11/23/2019   Tinnitus of both ears 08/29/2020   Trouble swallowing    Unspecified hemorrhoids 02/10/2015   UTI (urinary tract infection) 05/19/2022   Varicose veins of bilateral lower extremities with other complications 08/10/2012   IMO SNOMED Dx Update Oct 2024     Vasomotor rhinitis 06/28/2021   Vertigo 03/19/2012    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   Increasing residual urine  03/10/2022   Melena 12/16/2021   Vasomotor rhinitis 06/28/2021   Otalgia of both ears 06/06/2021   Brain cyst 01/09/2021   Chronic back pain 11/28/2020   Hyperlipidemia associated with type 2 diabetes mellitus (HCC) 11/22/2020   Mixed conductive and sensorineural hearing loss of right ear with restricted hearing of left ear 10/16/2020   Allergic rhinitis 10/16/2020   Constipation    Hearing loss    Severe episode of recurrent major depressive disorder, 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   Hypokalemia 08/29/2020   BPH without urinary obstruction 08/29/2020   Tinnitus of both ears 08/29/2020   Persistent atrial fibrillation (HCC) 08/29/2020   Chronic left shoulder pain 08/06/2020   Allergy    Arthritis    Blood in stool    Diabetes mellitus without complication (HCC)    GERD (gastroesophageal reflux disease)    Hypertension    Leg swelling    Nasal congestion    PONV (postoperative nausea and vomiting)    Rectal pain    Thrombosed hemorrhoids    Trouble swallowing    Lower extremity edema 07/04/2020   Other fatigue 07/04/2020   Preoperative clearance 07/04/2020   Hypotension 07/04/2020   SOB (shortness of breath) 07/04/2020   Allergic drug reaction 05/24/2020   Body mass index (BMI) 26.0-26.9, adult 03/15/2020   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   Radiculopathy, lumbar region 03/15/2020   Neck pain 03/15/2020   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/23/2019   Hyperlipidemia 11/23/2019   Bilateral hearing loss 11/23/2019   Tick bite of back 11/23/2019   Chronic pain syndrome 11/23/2019   Chest discomfort 06/08/2019   Benign neoplasm of brain (HCC) 05/24/2019   Rash 05/24/2019   Dry skin 05/09/2019   Seasonal and perennial allergic rhinitis 05/09/2019   Food intolerance 05/09/2019   DDD (degenerative disc disease), cervical 04/26/2019   DDD (degenerative disc disease), lumbar 04/26/2019   Chronic pansinusitis 06/30/2018    Deviated septum 05/31/2018   Insect bite 03/16/2018   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) 03/12/2018   Excessive daytime sleepiness 02/11/2018   Sleep apnea with use of continuous positive airway pressure (CPAP) 02/11/2018   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   Nocturia more than twice per night 02/11/2018   Bilateral leg and foot pain 03/02/2017  Fibromyalgia 03/02/2017   Localized swelling of both lower legs 03/02/2017   Other chronic pain 03/02/2017   Raynaud's disease 03/02/2017   Raynaud's phenomenon 03/02/2017   Pain in joint of right shoulder 02/21/2017   DM (diabetes mellitus) type II uncontrolled, periph vascular disorder 01/02/2016   Right shoulder pain 02/15/2015   Rectal bleeding 02/12/2015   Rectal ulcer with bleeding after hemorrhoid banding 02/11/2015   Lower GI bleed    Blood in stool, frank 02/10/2015   Acute renal insufficiency 02/10/2015   Unspecified hemorrhoids 02/10/2015   GI bleed 02/10/2015   Acute GI bleeding    Stroke (HCC) 01/26/2015   Palpitations 01/26/2015   Essential (primary) hypertension 01/26/2015   Hyperlipidemia LDL goal <70 01/26/2015   Cerebrovascular accident, old 11/16/2014   Internal hemorrhoids 10/22/2014   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 10/17/2014   OSA on CPAP 10/17/2014   Persistent headaches 10/17/2014   Subacute confusional state 10/17/2014   Chronic cough 08/27/2014   Sinus infection 06/30/2014   Sinusitis, acute, maxillary 02/02/2014   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   Prolapsed hemorrhoids 10/05/2013   Hemangioma 02/12/2013   Decreased hearing of both ears 02/12/2013   Abnormality of gait 11/10/2012   Hypersomnia, persistent 11/05/2012   Sleep apnea 11/05/2012   Pain in finger of right hand 10/06/2012   Internal and external hemorrhoids without complication 10/04/2012   Varicose veins of bilateral lower extremities with other complications  08/10/2012   Acute upper respiratory infection 08/10/2012   Bigeminy ? 03/22/2012   Vertigo 03/19/2012   Preventative health care 11/26/2011   Sleep apnea 11/03/2011   Insomnia 08/05/2011   Difficulty urinating    Hemorrhoid 05/22/2011   Dysuria 11/29/2010   Arthralgia 07/25/2010   Paresthesia 07/25/2010   HTN (hypertension) 02/04/2010   ANXIETY DEPRESSION 12/09/2007   GERD 12/09/2007   FATIGUE 12/09/2007   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   Low back pain 07/20/2007   SYMPTOM, HYPERSOMNIA NOS 02/19/2007   Rhinitis, chronic 12/23/2006   Headache 12/23/2006   NEPHROLITHIASIS 08/13/2006    Past Surgical History:  Procedure Laterality Date   CATARACT EXTRACTION     x 3   EYE SURGERY  1968, 1985, 1987   cataracts   FLEXIBLE SIGMOIDOSCOPY N/A 02/11/2015   Procedure: FLEXIBLE SIGMOIDOSCOPY;  Surgeon: Kenney Peacemaker, MD;  Location: [facility];  Service: Endoscopy;  Laterality: N/A;   INNER EAR SURGERY     rt ear   INNER EAR SURGERY  1992   surgery - right arm     1983       Home Medications    Prior to Admission medications   Medication Sig Start Date End Date Taking? Authorizing Provider  doxycycline  (VIBRA -TABS) 100 MG tablet Take 1 tablet (100 mg total) by mouth 2 (two) times daily for 10 days. 10/29/23 11/08/23 Yes Starlene Eaton, FNP  loratadine  (CLARITIN ) 10 MG tablet Take 1 tablet (10 mg total) by mouth daily. 10/29/23  Yes Starlene Eaton, FNP  Accu-Chek Softclix Lancets lancets Use as instructed 1x a day 05/20/22   Emilie Harden, MD  allopurinol  (ZYLOPRIM ) 100 MG tablet Take 1 tablet (100 mg total) by mouth 2 (two) times daily. 03/20/23   Roel Clarity R, DO  amLODipine  (NORVASC ) 2.5 MG tablet TAKE 1 TABLET (2.5 MG TOTAL) BY MOUTH DAILY. MUST KEEP APRIL'S APPT FOR ADDITIONAL REFILLS 10/20/23   Revankar, Micael Adas, MD  azelastine  (ASTELIN ) 0.1 % nasal spray Place 1  spray into both nostrils 2 (two) times daily. Use in each nostril as directed 12/22/22    Crecencio Dodge, Candida Chalk, DO  Azelastine  HCl (ASTEPRO ) 0.15 % SOLN Place 1 spray into the nose in the morning and at bedtime. 06/20/22   Estill Hemming, DO  BD PEN NEEDLE NANO 2ND GEN 32G X 4 MM MISC USE ONCE DAILY AS DIRECTED 06/28/21   Emilie Harden, MD  clotrimazole -betamethasone  (LOTRISONE ) cream Apply 1 application  topically as needed for rash.    [provider]  Continuous Glucose Sensor (FREESTYLE LIBRE 3 PLUS SENSOR) MISC Inject 1 Device into the skin continuous. Change every 15 days 10/08/23   Emilie Harden, MD  cyclobenzaprine  (FLEXERIL ) 10 MG tablet TAKE 1 TABLET BY MOUTH TWICE A DAY AS NEEDED FOR MUSCLE SPASM 11/02/23   Crecencio Dodge, Candida Chalk, DO  empagliflozin  (JARDIANCE ) 25 MG TABS tablet TAKE 1 TABLET (25 MG TOTAL) BY MOUTH DAILY. 09/18/23   Emilie Harden, MD  EPINEPHrine  0.3 mg/0.3 mL IJ SOAJ injection Inject 0.3 mg into the muscle as needed for anaphylaxis. 10/01/23   Estill Hemming, DO  famotidine  (PEPCID ) 40 MG/5ML suspension Take 2.5 mLs (20 mg total) by mouth daily. 09/18/23   Lowne Chase, Yvonne R, DO  FIBER SELECT GUMMIES PO Take 1 tablet by mouth daily.    [provider]  finasteride  (PROSCAR ) 5 MG tablet Take 5 mg by mouth daily. 06/03/22   [provider]  fluticasone  (CUTIVATE ) 0.05 % cream Apply 1 application topically 2 (two) times daily.    [provider]  glucose blood (ACCU-CHEK GUIDE) test strip Use as instructed 1x a day - 05/20/22   Emilie Harden, MD  HYDROcodone -acetaminophen  (HYCET) 7.5-325 mg/15 ml solution Take 10-15 mLs by mouth every 8 (eight) hours as needed for moderate pain (pain score 4-6). 10/19/23 10/18/24  Lylia Sand, MD  hydrocortisone  (ANUSOL -HC) 2.5 % rectal cream PLACE 1 APPLICATION RECTALLY 2 (TWO) TIMES DAILY. 08/26/23   Roel Clarity R, DO  hydrOXYzine  (ATARAX ) 25 MG tablet TAKE 1 TABLET (25 MG TOTAL) BY MOUTH EVERY 8 (EIGHT) HOURS AS NEEDED FOR ANXIETY OR ITCHING. 11/02/23   Crecencio Dodge, Yvonne R, DO  losartan  (COZAAR ) 100 MG tablet TAKE 1 TABLET BY MOUTH EVERY DAY 10/19/23   Crecencio Dodge, Yvonne R, DO  metFORMIN  (GLUCOPHAGE ) 500 MG tablet TAKE 1 TABLET BY MOUTH 2 TIMES DAILY WITH A MEAL. 09/10/23   Emilie Harden, MD  metoprolol  tartrate (LOPRESSOR ) 50 MG tablet TAKE 1 TABLET BY MOUTH EVERY DAY 10/19/23   Crecencio Dodge, Candida Chalk, DO  naloxone  (NARCAN ) nasal spray 4 mg/0.1 mL In case of opioid overdose 10/19/23   Lylia Sand, MD  polyethylene glycol powder (GLYCOLAX /MIRALAX ) 17 GM/SCOOP powder TAKE 17 G BY MOUTH DAILY AS NEEDED. 07/03/23   Crecencio Dodge, Yvonne R, DO  pravastatin  (PRAVACHOL ) 20 MG tablet TAKE 2 TABLETS (40 MG TOTAL) BY MOUTH DAILY. 06/03/23   Estill Hemming, DO  pregabalin  (LYRICA ) 100 MG capsule Take 1 capsule (100 mg total) by mouth 2 (two) times daily. 09/04/23   Lylia Sand, MD  promethazine -dextromethorphan (PROMETHAZINE -DM) 6.25-15 MG/5ML syrup Take 5 mLs by mouth 4 (four) times daily as needed. 05/30/22   Roel Clarity R, DO  Semaglutide , 1 MG/DOSE, (OZEMPIC , 1 MG/DOSE,) 4 MG/3ML SOPN Inject 1 mg into the skin once a week. 06/05/23   Emilie Harden, MD  tamsulosin  (FLOMAX ) 0.4 MG CAPS capsule Take 0.4 mg by mouth daily. 04/19/22  [provider]  triamcinolone  (NASACORT ) 55 MCG/ACT AERO nasal inhaler Place 2 sprays into the nose daily. 06/06/21   Lowne Chase, Yvonne R, DO  venlafaxine  XR (EFFEXOR -XR) 75 MG 24 hr capsule TAKE 1 CAPSULE BY MOUTH DAILY WITH BREAKFAST. 05/16/23   Estill Hemming, DO    Family History Family History  Problem Relation Age of Onset   Breast cancer Mother 20       breast   Huntington's disease Mother    Stroke Father    Heart disease Father    Hypertension Father    Heart attack Father    Hypertension Brother    Hyperlipidemia Neg Hx    Diabetes Neg Hx     Social History Social History   Tobacco Use   Smoking status: Never   Smokeless tobacco: Never  Vaping Use   Vaping  status: Never Used  Substance Use Topics   Alcohol  use: No    Alcohol /week: 0.0 standard drinks of alcohol     Comment: none   Drug use: No     Allergies   Amoxicillin, Sulfa  antibiotics, and Terfenadine   Review of Systems Review of Systems Per HPI  Physical Exam Triage Vital Signs ED Triage Vitals  Encounter Vitals Group     BP 10/29/23 1720 131/81     Systolic BP Percentile --      Diastolic BP Percentile --      Pulse Rate 10/29/23 1720 81     Resp 10/29/23 1720 17     Temp 10/29/23 1720 97.9 F (36.6 C)     Temp Source 10/29/23 1720 Oral     SpO2 10/29/23 1720 97 %     Weight --      Height --      Head Circumference --      Peak Flow --      Pain Score 10/29/23 1725 3     Pain Loc --      Pain Education --      Exclude from Growth Chart --    No data found.  Updated Vital Signs BP 131/81 (BP Location: Right Arm)   Pulse 81   Temp 97.9 F (36.6 C) (Oral)   Resp 17   SpO2 97%   Visual Acuity Right Eye Distance:   Left Eye Distance:   Bilateral Distance:    Right Eye Near:   Left Eye Near:    Bilateral Near:     Physical Exam Vitals and nursing note reviewed.  Constitutional:      Appearance: He is not ill-appearing or toxic-appearing.  HENT:     Head: Normocephalic and atraumatic.     Jaw: There is normal jaw occlusion.     Right Ear: Hearing, tympanic membrane, ear canal and external ear normal.     Left Ear: Hearing, tympanic membrane, ear canal and external ear normal.     Nose: Nose normal.     Mouth/Throat:     Lips: Pink.     Mouth: Mucous membranes are moist. No injury or oral lesions.     Dentition: Normal dentition.     Tongue: No lesions.     Pharynx: Oropharynx is clear. Uvula midline. No pharyngeal swelling, oropharyngeal exudate, posterior oropharyngeal erythema, uvula swelling or postnasal drip.     Tonsils: No tonsillar exudate.  Eyes:     General: Lids are normal. Vision grossly intact. Gaze aligned appropriately.      Extraocular Movements: Extraocular movements intact.  Conjunctiva/sclera: Conjunctivae normal.  Neck:     Trachea: Trachea and phonation normal.  Cardiovascular:     Rate and Rhythm: Normal rate and regular rhythm.     Heart sounds: Normal heart sounds, S1 normal and S2 normal.  Pulmonary:     Effort: Pulmonary effort is normal. No respiratory distress.     Breath sounds: Normal breath sounds and air entry.  Musculoskeletal:     Right wrist: Normal.     Left wrist: Normal.     Right hand: Swelling (scant soft tissue swelling to the dorsal ulnar portion of the right hand) and tenderness (TTP over the fifth metacarpal) present. No deformity, lacerations or bony tenderness. Normal range of motion. Normal strength. Normal sensation. There is no disruption of two-point discrimination. Normal capillary refill. Normal pulse.     Left hand: Normal.     Cervical back: Neck supple.     Comments: 5/5 grip strength bilaterally, less than 2 cap refill, sensation and strength intact distally.   Lymphadenopathy:     Cervical: No cervical adenopathy.  Skin:    General: Skin is warm and dry.     Capillary Refill: Capillary refill takes less than 2 seconds.     Findings: Erythema present. No rash.       Neurological:     General: No focal deficit present.     Mental Status: He is alert and oriented to person, place, and time. Mental status is at baseline.     Cranial Nerves: No dysarthria or facial asymmetry.  Psychiatric:        Mood and Affect: Mood normal.        Speech: Speech normal.        Behavior: Behavior normal.        Thought Content: Thought content normal.        Judgment: Judgment normal.      UC Treatments / Results  Labs (all labs ordered are listed, but only abnormal results are displayed) Labs Reviewed - No data to display  EKG   Radiology No results found.   Procedures Procedures (including critical care time)  Medications Ordered in UC Medications - No data  to display  Initial Impression / Assessment and Plan / UC Course  I have reviewed the triage vital signs and the nursing notes.  Pertinent labs & imaging results that were available during my care of the patient were reviewed by me and considered in my medical decision making (see chart for details).   1. Right hand pain, fall Right hand x-ray is negative for acute bony abnormality. Will treat this with supportive care. Pain is improving with use of over the counter tylenol , he may continue this as well as rest, ice, compression, and elevation to improve pain/swelling. Walking referral to hand specialist provided for follow-up as needed.  2. Dysfunction of both eustachian tubes Presentation consistent with eustachian tube dysfunction.  No signs of otitis media, TMJ, injury.  Flonase  and oral antihistamine recommended. Tylenol  as needed for pain.   3. Tick bite of right lower leg Doxycycline  ordered. Advised to return to UC/PCP should erythema worsen significantly or if he should develop new signs/symptoms of worsening infection.  Counseled patient on potential for adverse effects with medications prescribed/recommended today, strict ER and return-to-clinic precautions discussed, patient verbalized understanding.    Final Clinical Impressions(s) / UC Diagnoses   Final diagnoses:  Right hand pain  Fall, initial encounter  Tick bite of right lower leg, initial encounter  Dysfunction of both eustachian tubes     Discharge Instructions      Your ear pain and popping are likely due to fluid level behind both of your eardrums. Use 2 puffs of Flonase  daily to reduce inflammation and swelling behind your eardrums.  Take doxycycline  antibiotic every 12 hours (twice daily) with food for the next 10 days due to infected tick bite.  I have called [facility] to confirm that you are able to crush the doxycycline  tablets into large pieces and placed the pieces onto applesauce, then swallow the  medication.  Watch for new or worsening signs of infection to the right leg such as redness, swelling, pus from the site, or worsening pain/fever.  Your x-ray of your right hand is normal and does not show any broken bones. Take Tylenol  1000 mg every 6 hours as needed for pain and swelling of the right hand.  If you develop any new or worsening symptoms or if your symptoms do not start to improve, please return here or follow-up with your primary care provider. If your symptoms are severe, please go to the emergency room.   ED Prescriptions     Medication Sig Dispense Auth. Provider   doxycycline  (VIBRA -TABS) 100 MG tablet Take 1 tablet (100 mg total) by mouth 2 (two) times daily for 10 days. 20 tablet Marifer Hurd M, FNP   loratadine  (CLARITIN ) 10 MG tablet Take 1 tablet (10 mg total) by mouth daily. 30 tablet Starlene Eaton, FNP      PDMP not reviewed this encounter.   Starlene Eaton, [place] 11/04/23 2115

## 2023-10-30 ENCOUNTER — Other Ambulatory Visit: Payer: Self-pay | Admitting: Family Medicine

## 2023-10-31 ENCOUNTER — Other Ambulatory Visit: Payer: Self-pay | Admitting: Family Medicine

## 2023-11-03 NOTE — Telephone Encounter (Signed)
 Outcome N/A on May 8 by [employer] 2017 NCPDP We received a prior authorization request for the member and product listed above. The [facility] Prior Authorization Team is not able to review this request because the requested product currently does not require prior authorization. Based on information reviewed, the requested prescription is currently authorized for coverage by the plan until 04/21/2024. Please resubmit this request within 30 days of authorization expiration date. Please have the dispensing pharmacy reprocess the claim. The pharmacy may obtain assistance by contacting the [employer] Help Desk anytime at [phone].

## 2023-11-20 ENCOUNTER — Other Ambulatory Visit: Payer: Self-pay | Admitting: Family Medicine

## 2023-11-27 ENCOUNTER — Ambulatory Visit (INDEPENDENT_AMBULATORY_CARE_PROVIDER_SITE_OTHER): Admitting: Family Medicine

## 2023-11-27 ENCOUNTER — Encounter: Payer: Self-pay | Admitting: Family Medicine

## 2023-11-27 VITALS — BP 130/90 | HR 85 | Temp 98.2°F | Resp 18 | Ht 69.5 in | Wt 170.4 lb

## 2023-11-27 DIAGNOSIS — J0140 Acute pansinusitis, unspecified: Secondary | ICD-10-CM | POA: Diagnosis not present

## 2023-11-27 DIAGNOSIS — E1169 Type 2 diabetes mellitus with other specified complication: Secondary | ICD-10-CM

## 2023-11-27 DIAGNOSIS — E785 Hyperlipidemia, unspecified: Secondary | ICD-10-CM | POA: Diagnosis not present

## 2023-11-27 DIAGNOSIS — E1165 Type 2 diabetes mellitus with hyperglycemia: Secondary | ICD-10-CM | POA: Diagnosis not present

## 2023-11-27 DIAGNOSIS — R051 Acute cough: Secondary | ICD-10-CM

## 2023-11-27 DIAGNOSIS — M67441 Ganglion, right hand: Secondary | ICD-10-CM

## 2023-11-27 MED ORDER — TRIAMCINOLONE ACETONIDE 55 MCG/ACT NA AERO: 2.0000 | INHALATION_SPRAY | Freq: Every day | NASAL | 12 refills | Status: AC

## 2023-11-27 MED ORDER — PROMETHAZINE-DM 6.25-15 MG/5ML PO SYRP: 5.0000 mL | ORAL_SOLUTION | Freq: Four times a day (QID) | ORAL | 0 refills | Status: DC | PRN

## 2023-11-27 MED ORDER — DOXYCYCLINE HYCLATE 100 MG PO TABS: 100.0000 mg | ORAL_TABLET | Freq: Two times a day (BID) | ORAL | 0 refills | Status: DC

## 2023-11-27 NOTE — Patient Instructions (Signed)

## 2023-11-27 NOTE — Progress Notes (Signed)
 Established Patient Office Visit  Subjective   Patient ID: Chase [name], male    DOB: 1960-06-22  Age: 63 y.o. MRN: [id number]  Chief Complaint  Patient presents with   Sinus Problem    X2-3 weeks ago, pt states having congestion and sinus pain.    Hand Pain    Right hand, pt states hand has a bump    HPI Discussed the use of AI scribe software for clinical note transcription with the patient, who gave verbal consent to proceed.  History of Present Illness   Chase [name] is a 63 year old male who presents with sinus pressure and pain.  He has been experiencing sinus pressure and pain for the past two to three weeks. His symptoms include ear pain and a dry cough, which he describes as similar to a 'COPD cough', although he does not have COPD. His temperature has remained normal. He visited urgent care and was prescribed Claritin  and advised to use Flonase , which he cannot use due to alcohol  content causing epistaxis and sores. At urgent care, he was informed he might have fluid on his eardrum and was given a different nasal spray, an antibiotic, and cough medicine.  He also has a painful cyst on his right hand, which he associates with falls, including tripping over a dog and a blanket. The cyst was previously x-rayed and was not as hard as it is now. No fracture was identified in the x-ray.  He is concerned about his diabetes and its potential impact on his kidney function, expressing worry about the risk of kidney issues due to his diabetes. He is currently seeing a diabetes specialist and plans to return in August.      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   Increasing residual urine 03/10/2022    Melena 12/16/2021   Vasomotor rhinitis 06/28/2021   Otalgia of both ears 06/06/2021   Brain cyst 01/09/2021   Chronic back pain 11/28/2020   Hyperlipidemia associated with type 2 diabetes mellitus (HCC) 11/22/2020   Mixed conductive and sensorineural hearing loss of right ear with restricted hearing of left ear 10/16/2020   Allergic rhinitis 10/16/2020   Constipation    Hearing loss    Severe episode of recurrent major depressive disorder, 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   Hypokalemia 08/29/2020   BPH without urinary obstruction 08/29/2020   Tinnitus of both ears 08/29/2020   Persistent atrial fibrillation (HCC) 08/29/2020   Chronic left shoulder pain 08/06/2020   Allergy    Arthritis    Blood in stool    Diabetes mellitus without complication (HCC)    GERD (gastroesophageal reflux disease)    Hypertension    Leg swelling    Nasal congestion    PONV (postoperative nausea and vomiting)    Rectal pain    Thrombosed hemorrhoids    Trouble swallowing    Lower extremity edema 07/04/2020   Other fatigue 07/04/2020   Preoperative clearance 07/04/2020   Hypotension 07/04/2020   SOB (shortness of breath) 07/04/2020   Allergic drug reaction 05/24/2020   Body mass index (BMI) 26.0-26.9, adult 03/15/2020   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   Radiculopathy, lumbar region 03/15/2020   Neck pain 03/15/2020   Idiopathic chronic  gout of multiple sites without tophus 11/23/2019   Hyperlipidemia 11/23/2019   Bilateral hearing loss 11/23/2019   Tick bite of back 11/23/2019   Chronic pain syndrome 11/23/2019   Chest discomfort 06/08/2019   Benign neoplasm of brain (HCC) 05/24/2019   Rash 05/24/2019   Dry skin 05/09/2019   Seasonal and perennial allergic rhinitis 05/09/2019   Food intolerance 05/09/2019   DDD (degenerative disc disease), cervical 04/26/2019   DDD (degenerative disc disease), lumbar 04/26/2019   Chronic pansinusitis 06/30/2018   Deviated  septum 05/31/2018   Insect bite 03/16/2018   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) 03/12/2018   Excessive daytime sleepiness 02/11/2018   Sleep apnea with use of continuous positive airway pressure (CPAP) 02/11/2018   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   Nocturia more than twice per night 02/11/2018   Bilateral leg and foot pain 03/02/2017   Fibromyalgia 03/02/2017   Localized swelling of both lower legs 03/02/2017   Other chronic pain 03/02/2017   Raynaud's disease 03/02/2017   Raynaud's phenomenon 03/02/2017   Pain in joint of right shoulder 02/21/2017   DM (diabetes mellitus) type II uncontrolled, periph vascular disorder 01/02/2016   Right shoulder pain 02/15/2015   Rectal bleeding 02/12/2015   Rectal ulcer with bleeding after hemorrhoid banding 02/11/2015   Lower GI bleed    Blood in stool, frank 02/10/2015   Acute renal insufficiency 02/10/2015   Unspecified hemorrhoids 02/10/2015   GI bleed 02/10/2015   Acute GI bleeding    Stroke (HCC) 01/26/2015   Palpitations 01/26/2015   Essential (primary) hypertension 01/26/2015   Hyperlipidemia LDL goal <70 01/26/2015   Cerebrovascular accident, old 11/16/2014   Internal hemorrhoids 10/22/2014   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 10/17/2014   OSA on CPAP 10/17/2014   Persistent headaches 10/17/2014   Subacute confusional state 10/17/2014   Chronic cough 08/27/2014   Sinus infection 06/30/2014   Sinusitis, acute, maxillary 02/02/2014   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   Prolapsed hemorrhoids 10/05/2013   Hemangioma 02/12/2013   Decreased hearing of both ears 02/12/2013   Abnormality of gait 11/10/2012   Hypersomnia, persistent 11/05/2012   Sleep apnea 11/05/2012   Pain in finger of right hand 10/06/2012   Internal and external hemorrhoids without complication 10/04/2012   Varicose veins of bilateral lower extremities with other complications 08/10/2012    Acute upper respiratory infection 08/10/2012   Bigeminy ? 03/22/2012   Vertigo 03/19/2012   Preventative health care 11/26/2011   Sleep apnea 11/03/2011   Insomnia 08/05/2011   Difficulty urinating    Hemorrhoid 05/22/2011   Dysuria 11/29/2010   Arthralgia 07/25/2010   Paresthesia 07/25/2010   HTN (hypertension) 02/04/2010   ANXIETY DEPRESSION 12/09/2007   GERD 12/09/2007   FATIGUE 12/09/2007   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   Low back pain 07/20/2007   SYMPTOM, HYPERSOMNIA NOS 02/19/2007   Rhinitis, chronic 12/23/2006   Headache 12/23/2006   NEPHROLITHIASIS 08/13/2006   Past Medical History:  Diagnosis Date   Abnormality of gait 11/10/2012   Acute GI bleeding    Acute renal insufficiency 02/10/2015   Acute upper respiratory infection 08/10/2012   Allergic drug reaction 05/24/2020   Allergic rhinitis 10/16/2020   Allergy    ANXIETY DEPRESSION 12/09/2007   Qualifier: Diagnosis of  By: Gardenia Jump RN, Glena Landau    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) 03/12/2018   Cerebrovascular accident, old 11/16/2014   Chest discomfort 06/08/2019   Chronic back pain 11/28/2020   Chronic cough 08/27/2014   CXR 08/2014:  Mild hyperinflation, no acute process Spiro 09/2014:  Difficulty with procedure, but essentially normal +CVA's in past, with aspiration risk by history Speech evaluation 2016:      Chronic left shoulder pain 08/06/2020   Chronic pain syndrome 11/23/2019   Chronic pansinusitis 06/30/2018   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   Complex sleep apnea syndrome  02/11/2018   Constipation    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   DDD (degenerative disc disease), cervical 04/26/2019   DDD (degenerative disc disease), lumbar 04/26/2019   Decreased hearing of both ears 02/12/2013   Deviated septum 05/31/2018   Diabetes mellitus without complication (HCC)    Difficulty urinating    DM (diabetes mellitus) type II uncontrolled, periph vascular disorder 01/02/2016   Dry skin 05/09/2019   Dysuria 11/29/2010   Essential (primary) hypertension 01/26/2015   Excessive daytime sleepiness 02/11/2018   FATIGUE 12/09/2007   Qualifier: Diagnosis of  By: Gardenia Jump RN, Glena Landau    Fibromyalgia 03/02/2017   Food intolerance 05/09/2019   GERD 12/09/2007   Qualifier: Diagnosis of  By: Kingsley Penny MD, Darlene Ehlers    GERD (gastroesophageal reflux disease)    GI bleed 02/10/2015   Headache 12/23/2006   Qualifier: Diagnosis of   By: Toi Foster     IMO SNOMED Dx Update Oct 2024     Hearing loss    Hemangioma 02/12/2013   Hemorrhoid 05/22/2011   HTN (hypertension) 02/04/2010   Qualifier: Diagnosis of  By: Rodrick Clapper MD, Stacey     Hyperlipidemia    Hyperlipidemia associated with type 2 diabetes mellitus (HCC) 11/22/2020   Hyperlipidemia LDL goal <70 01/26/2015   Hypersomnia, persistent 11/05/2012   epworth of 18 points, was placed on CPAP in 2008 with very low AHI ( no number  Mentioned) and struggles ever since with PAP. 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   Leg swelling    Localized swelling of both lower legs 03/02/2017   Low back pain 07/20/2007   Qualifier: Diagnosis of  By: Kingsley Penny MD,  Darlene Ehlers    Lower extremity edema 07/04/2020   Lower GI bleed    Melena 12/16/2021   Mixed conductive and sensorineural hearing loss of right ear with restricted hearing of left ear 10/16/2020   Nasal congestion    Neck pain 03/15/2020   NEPHROLITHIASIS 08/13/2006   Qualifier: Diagnosis of  By: Alen Amy     Nocturia more than twice per night 02/11/2018   OSA on CPAP 10/17/2014   Otalgia of both ears 06/06/2021   Other chronic pain 03/02/2017   Other fatigue 07/04/2020   Pain in finger of right  hand 10/06/2012   Pain in joint of right shoulder 02/21/2017   Palpitations 01/26/2015   Paresthesia 07/25/2010   Persistent atrial fibrillation (HCC) 08/29/2020   Persistent headaches 10/17/2014   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   Qualifier: Diagnosis of  By: Kingsley Penny MD, Cindie Credit R    PONV (postoperative nausea and vomiting)    Preoperative clearance 07/04/2020   Preventative health care 11/26/2011   Prolapsed hemorrhoids 10/05/2013   Radiculopathy, lumbar region 03/15/2020   Rash 05/24/2019   Rash and other nonspecific skin eruption 07/23/2022   Raynaud's disease 03/02/2017   Raynaud's phenomenon 03/02/2017   Rectal bleeding 02/12/2015   Rectal pain    Rectal ulcer with bleeding after hemorrhoid banding 02/11/2015   Rhinitis, chronic 12/23/2006   Qualifier: Diagnosis of  By: Gardenia Jump RN, Regina G    Right shoulder pain 02/15/2015   Seasonal and perennial allergic rhinitis 05/09/2019   Severe episode of recurrent major depressive disorder, 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   Sinus infection 06/30/2014   Sinusitis, acute, maxillary 02/02/2014   Sleep apnea    Sleep apnea with use of continuous positive airway pressure (CPAP) 02/11/2018   SOB (shortness of breath) 07/04/2020   Stroke (HCC)    Stroke (HCC)    Subacute confusional state 10/17/2014   SYMPTOM, HYPERSOMNIA NOS 02/19/2007   Qualifier: Diagnosis of  By: Cala Castleman     Thrombosed hemorrhoids    Tick  bite of back 11/23/2019   Tinnitus of both ears 08/29/2020   Trouble swallowing    Unspecified hemorrhoids 02/10/2015   UTI (urinary tract infection) 05/19/2022   Varicose veins of bilateral lower extremities with other complications 08/10/2012   IMO SNOMED Dx Update Oct 2024     Vasomotor rhinitis 06/28/2021   Vertigo 03/19/2012   Past Surgical History:  Procedure Laterality Date   CATARACT EXTRACTION     x 3   EYE SURGERY  1968, 1985, 1987   cataracts   FLEXIBLE SIGMOIDOSCOPY N/A 02/11/2015   Procedure: FLEXIBLE SIGMOIDOSCOPY;  Surgeon: Kenney Peacemaker, MD;  Location: [facility];  Service: Endoscopy;  Laterality: N/A;   INNER EAR SURGERY     rt ear   INNER EAR SURGERY  1992   surgery - right arm     1983   Social History   Tobacco Use   Smoking status: Never   Smokeless tobacco: Never  Vaping Use   Vaping status: Never Used  Substance Use Topics   Alcohol  use: No    Alcohol /week: 0.0 standard drinks of alcohol     Comment: none   Drug use: No   Social History   Socioeconomic History   Marital status: Single    Spouse name: Not on file   Number of children: 0   Years of education: Not on file   Highest education level: 12th grade  Occupational History   Not on file  Tobacco Use   Smoking status: Never   Smokeless tobacco: Never  Vaping Use   Vaping status: Never Used  Substance and Sexual Activity   Alcohol  use: No    Alcohol /week: 0.0 standard drinks of alcohol     Comment: none   Drug use: No   Sexual activity: Never  Other Topics Concern   Not on file  Social History Narrative   No caffeine intake except for chocolate.  Exercised-less walking    Social Drivers of Health   Financial Resource Strain: Patient Declined (11/25/2023)   Overall Financial Resource Strain (CARDIA)  Difficulty of Paying Living Expenses: Patient declined  Food Insecurity: Food Insecurity Present (11/25/2023)   Hunger Vital Sign    Worried About Running Out of Food in the  Last Year: Sometimes true    Ran Out of Food in the Last Year: Patient declined  Transportation Needs: Patient Declined (02/17/2023)   PRAPARE - Administrator, Civil Service (Medical): Patient declined    Lack of Transportation (Non-Medical): Patient declined  Physical Activity: Unknown (02/17/2023)   Exercise Vital Sign    Days of Exercise per Week: Patient declined    Minutes of Exercise per Session: Not on file  Stress: Stress Concern Present (11/25/2023)   [employer] of Occupational Health - Occupational Stress Questionnaire    Feeling of Stress : To some extent  Social Connections: Unknown (11/25/2023)   Social Connection and Isolation Panel    Frequency of Communication with Friends and Family: Patient declined    Frequency of Social Gatherings with Friends and Family: Patient declined    Attends Religious Services: Patient declined    Database administrator or Organizations: Patient declined    Attends Banker Meetings: Not on file    Marital Status: Patient declined  Intimate Partner Violence: Unknown (01/17/2022)   Received from [facility]   HITS    Physically Hurt: Not on file    Insult or Talk Down To: Not on file    Threaten Physical Harm: Not on file    Scream or Curse: Not on file   Family Status  Relation Name Status   Mother  Deceased at age 76   Father  Deceased at age 65   Brother 1 Alive   Neg Hx  (Not Specified)  No partnership data on file   Family History  Problem Relation Age of Onset   Breast cancer Mother 73       breast   Huntington's disease Mother    Stroke Father    Heart disease Father    Hypertension Father    Heart attack Father    Hypertension Brother    Hyperlipidemia Neg Hx    Diabetes Neg Hx    Allergies  Allergen Reactions   Amoxicillin Hives, Itching and Other (See Comments)    White tongue; ? hives   Sulfa  Antibiotics Hives and Rash   Terfenadine     ? reaction      ROS    Objective:      BP (!) 130/90 (BP Location: Left Arm, Patient Position: Sitting, Cuff Size: Normal)   Pulse 85   Temp 98.2 F (36.8 C) (Oral)   Resp 18   Ht 5' 9.5 (1.765 m)   Wt 170 lb 6.4 oz (77.3 kg)   SpO2 97%   BMI 24.80 kg/m  BP Readings from Last 3 Encounters:  11/27/23 (!) 130/90  10/29/23 131/81  10/19/23 (!) 141/80   Wt Readings from Last 3 Encounters:  11/27/23 170 lb 6.4 oz (77.3 kg)  10/19/23 174 lb 6.4 oz (79.1 kg)  10/13/23 171 lb 1.9 oz (77.6 kg)   SpO2 Readings from Last 3 Encounters:  11/27/23 97%  10/29/23 97%  10/19/23 97%      Physical Exam   No results found for any visits on 11/27/23.  Last CBC Lab Results  Component Value Date   WBC 8.4 12/22/2022   HGB 13.4 12/22/2022   HCT 41.1 12/22/2022   MCV 90.4 12/22/2022   MCH 30.4 06/20/2022   RDW 13.1  12/22/2022   PLT 316.0 12/22/2022   Last metabolic panel Lab Results  Component Value Date   GLUCOSE 134 (H) 02/17/2023   NA 141 02/17/2023   K 3.6 02/17/2023   CL 100 02/17/2023   CO2 32 02/17/2023   BUN 8 02/17/2023   CREATININE 1.06 02/17/2023   GFR 75.23 02/17/2023   CALCIUM  9.4 02/17/2023   PHOS 3.5 02/11/2015   PROT 7.2 02/17/2023   ALBUMIN 4.3 02/17/2023   BILITOT 0.6 02/17/2023   ALKPHOS 82 02/17/2023   AST 27 02/17/2023   ALT 43 02/17/2023   ANIONGAP 9 02/12/2015   Last lipids Lab Results  Component Value Date   CHOL 134 12/22/2022   HDL 27.50 (L) 12/22/2022   LDLCALC 93 06/20/2022   LDLDIRECT 59.0 12/22/2022   TRIG 281.0 (H) 12/22/2022   CHOLHDL 5 12/22/2022   Last hemoglobin A1c Lab Results  Component Value Date   HGBA1C 7.1 (A) 10/08/2023   Last thyroid  functions Lab Results  Component Value Date   TSH 2.47 12/22/2022   Last vitamin D  No results found for: Lucetta Russel, VD25OH Last vitamin B12 and Folate Lab Results  Component Value Date   VITAMINB12 389 07/03/2020   FOLATE 22.6 02/10/2015      The ASCVD Risk score (Arnett DK, et al., 2019)  failed to calculate for the following reasons:   Risk score cannot be calculated because patient has a medical history suggesting prior/existing ASCVD    Assessment & Plan:   Problem List Items Addressed This Visit       Unprioritized   Sinus infection   Relevant Medications   doxycycline  (VIBRA -TABS) 100 MG tablet   triamcinolone  (NASACORT ) 55 MCG/ACT AERO nasal inhaler   promethazine -dextromethorphan (PROMETHAZINE -DM) 6.25-15 MG/5ML syrup   Hyperlipidemia associated with type 2 diabetes mellitus (HCC)   Relevant Orders   Comprehensive metabolic panel with GFR   Lipid panel   Other Visit Diagnoses       Ganglion cyst of joint of finger of right hand    -  Primary   Relevant Orders   Ambulatory referral to Orthopedic Surgery     Acute cough       Relevant Medications   promethazine -dextromethorphan (PROMETHAZINE -DM) 6.25-15 MG/5ML syrup     Type 2 diabetes mellitus with hyperglycemia, without long-term current use of insulin  (HCC)       Relevant Orders   Microalbumin / creatinine urine ratio   Comprehensive metabolic panel with GFR   Lipid panel   CBC with Differential/Platelet     Assessment and Plan    Sinusitis   He has experienced sinus pressure and pain for two to three weeks, with ear pain and a dry cough. Fluid in the ears likely contributes to his discomfort. Previously treated with Claritin  and advised to use Flonase , but cannot use Flonase  due to alcohol  content causing epistaxis and sores. The condition persists despite these measures. Doxycycline  is chosen as the antibiotic due to difficulty swallowing large pills. Prescribe doxycycline , an alternative nasal spray, and cough medicine.  Ganglion cyst   He has a painful cyst on the right hand since a fall. An X-ray showed no fracture. The location and characteristics suggest a ganglion cyst. Refer to a specialist for evaluation and potential removal.  Diabetes mellitus   He is concerned about the risk of kidney  issues due to diabetes. Currently under the care of a diabetes specialist with a follow-up in August. Offer to check kidney function during this visit if  requested.        Return if symptoms worsen or fail to improve.    Alzena Gerber R Lowne Chase, DO

## 2023-11-28 ENCOUNTER — Other Ambulatory Visit: Payer: Self-pay | Admitting: Family Medicine

## 2023-11-28 DIAGNOSIS — E785 Hyperlipidemia, unspecified: Secondary | ICD-10-CM

## 2023-11-28 LAB — CBC WITH DIFFERENTIAL/PLATELET
Absolute Lymphocytes: 2012 {cells}/uL (ref 850–3900)
Absolute Monocytes: 611 {cells}/uL (ref 200–950)
Basophils Absolute: 34 {cells}/uL (ref 0–200)
Basophils Relative: 0.4 %
Eosinophils Absolute: 353 {cells}/uL (ref 15–500)
Eosinophils Relative: 4.1 %
HCT: 40.1 % (ref 38.5–50.0)
Hemoglobin: 13.5 g/dL (ref 13.2–17.1)
MCH: 30.3 pg (ref 27.0–33.0)
MCHC: 33.7 g/dL (ref 32.0–36.0)
MCV: 90.1 fL (ref 80.0–100.0)
MPV: 11.9 fL (ref 7.5–12.5)
Monocytes Relative: 7.1 %
Neutro Abs: 5590 {cells}/uL (ref 1500–7800)
Neutrophils Relative %: 65 %
Platelets: 286 10*3/uL (ref 140–400)
RBC: 4.45 10*6/uL (ref 4.20–5.80)
RDW: 13.1 % (ref 11.0–15.0)
Total Lymphocyte: 23.4 %
WBC: 8.6 10*3/uL (ref 3.8–10.8)

## 2023-11-28 LAB — COMPREHENSIVE METABOLIC PANEL WITH GFR
AG Ratio: 2.1 (calc) (ref 1.0–2.5)
ALT: 25 U/L (ref 9–46)
AST: 15 U/L (ref 10–35)
Albumin: 4.4 g/dL (ref 3.6–5.1)
Alkaline phosphatase (APISO): 72 U/L (ref 35–144)
BUN: 13 mg/dL (ref 7–25)
CO2: 30 mmol/L (ref 20–32)
Calcium: 9.3 mg/dL (ref 8.6–10.3)
Chloride: 102 mmol/L (ref 98–110)
Creat: 0.93 mg/dL (ref 0.70–1.35)
Globulin: 2.1 g/dL (ref 1.9–3.7)
Glucose, Bld: 112 mg/dL — ABNORMAL HIGH (ref 65–99)
Potassium: 3.6 mmol/L (ref 3.5–5.3)
Sodium: 142 mmol/L (ref 135–146)
Total Bilirubin: 0.8 mg/dL (ref 0.2–1.2)
Total Protein: 6.5 g/dL (ref 6.1–8.1)
eGFR: 92 mL/min/{1.73_m2} (ref >=60)

## 2023-11-28 LAB — LIPID PANEL
Cholesterol: 122 mg/dL (ref <200)
HDL: 35 mg/dL — ABNORMAL LOW (ref >=40)
LDL Cholesterol (Calc): 67 mg/dL
Non-HDL Cholesterol (Calc): 87 mg/dL (ref <130)
Total CHOL/HDL Ratio: 3.5 (calc) (ref <5.0)
Triglycerides: 112 mg/dL (ref <150)

## 2023-11-28 LAB — MICROALBUMIN / CREATININE URINE RATIO
Creatinine, Urine: 68 mg/dL (ref 20–320)
Microalb Creat Ratio: 10 mg/g{creat} (ref <30)
Microalb, Ur: 0.7 mg/dL

## 2023-12-04 DIAGNOSIS — D497 Neoplasm of unspecified behavior of endocrine glands and other parts of nervous system: Secondary | ICD-10-CM | POA: Diagnosis not present

## 2023-12-05 ENCOUNTER — Ambulatory Visit: Payer: Self-pay | Admitting: Family Medicine

## 2023-12-07 ENCOUNTER — Encounter: Payer: Self-pay | Admitting: Physical Medicine & Rehabilitation

## 2023-12-07 ENCOUNTER — Encounter: Attending: Physical Medicine & Rehabilitation | Admitting: Physical Medicine & Rehabilitation

## 2023-12-07 VITALS — BP 130/79 | HR 79 | Ht 69.5 in | Wt 170.4 lb

## 2023-12-07 DIAGNOSIS — M797 Fibromyalgia: Secondary | ICD-10-CM | POA: Diagnosis not present

## 2023-12-07 DIAGNOSIS — G8929 Other chronic pain: Secondary | ICD-10-CM | POA: Diagnosis not present

## 2023-12-07 DIAGNOSIS — M545 Low back pain, unspecified: Secondary | ICD-10-CM | POA: Diagnosis not present

## 2023-12-07 MED ORDER — HYDROCODONE-ACETAMINOPHEN 7.5-325 MG/15ML PO SOLN: 10.0000 mL | Freq: Three times a day (TID) | ORAL | 0 refills | Status: DC | PRN

## 2023-12-07 NOTE — Progress Notes (Signed)
 Subjective:    Patient ID: Chase [name], male    DOB: [birth year], 63 y.o.   MRN: [id number]  HPI Chase [name] is a 63 y.o. 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) (03/12/2018), Cerebrovascular accident, old (11/16/2014), Chest discomfort (06/08/2019), Chronic back pain (11/28/2020), Chronic cough (08/27/2014), Chronic left shoulder pain (08/06/2020), Chronic pain syndrome (11/23/2019), Chronic pansinusitis (06/30/2018), Chronic prostatitis (12/06/2013), 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), Constipation, 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), DDD (degenerative disc disease), cervical (04/26/2019), DDD (degenerative disc disease), lumbar (04/26/2019), Decreased hearing of both ears (02/12/2013), Deviated septum (05/31/2018), Diabetes mellitus without complication (HCC), Difficulty urinating, DM (diabetes mellitus) type II uncontrolled, periph vascular disorder (01/02/2016), Dry skin (05/09/2019), Dysuria (11/29/2010), Essential (primary) hypertension (01/26/2015), Excessive daytime sleepiness (02/11/2018), FATIGUE (12/09/2007), Fibromyalgia (03/02/2017), Food intolerance (05/09/2019), GERD (12/09/2007), GERD (gastroesophageal reflux  disease), GI bleed (02/10/2015), Headache(784.0), Hearing loss, 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/12/2013), Hemorrhoid (05/22/2011), HTN (hypertension) (02/04/2010), Hyperlipidemia, Hyperlipidemia associated with type 2 diabetes mellitus (HCC) (11/22/2020), Hyperlipidemia LDL goal <70 (01/26/2015), Hypersomnia, persistent (11/05/2012), Hypertension, Hypokalemia (08/29/2020), Hypotension (07/04/2020), 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), 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), Increasing residual urine (03/10/2022), Insect bite (03/16/2018), Insomnia (08/05/2011), Internal and external hemorrhoids without complication (10/04/2012), Internal hemorrhoids (10/22/2014), 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), Leg swelling, Localized swelling of both lower legs (03/02/2017), Low back pain (07/20/2007), Lower extremity edema (07/04/2020), Lower GI bleed, Melena (12/16/2021), Mixed conductive and sensorineural hearing loss of right ear with restricted hearing of left ear (10/16/2020), Nasal congestion, Neck pain (03/15/2020), NEPHROLITHIASIS (08/13/2006), Nocturia more than twice per night (02/11/2018), OSA on CPAP (10/17/2014), Otalgia of both ears (06/06/2021), Other chronic pain (03/02/2017), Other fatigue (07/04/2020), Pain in finger of right hand (10/06/2012), Pain in joint of right shoulder (02/21/2017), Palpitations (01/26/2015), Paresthesia (07/25/2010), Persistent atrial fibrillation (HCC) (08/29/2020), Persistent headaches (10/17/2014), 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), PONV (postoperative nausea and vomiting), Preoperative clearance (07/04/2020), Prolapsed hemorrhoids (10/05/2013), Radiculopathy, lumbar region (03/15/2020), Rash (05/24/2019), Rash and other nonspecific skin eruption (07/23/2022), Raynaud's disease (03/02/2017), Raynaud's phenomenon (03/02/2017), Rectal bleeding (02/12/2015), Rectal pain, Rectal ulcer with bleeding after hemorrhoid banding (02/11/2015), Rhinitis, chronic  (12/23/2006), Right shoulder pain (02/15/2015), Seasonal and perennial allergic rhinitis (05/09/2019), Severe episode of recurrent major depressive disorder, without psychotic features (HCC) (08/29/2020), Sinus infection (06/30/2014), Sinusitis, acute, maxillary (02/02/2014), Sleep apnea, Sleep apnea with use of continuous positive airway pressure (CPAP) (02/11/2018), SOB (shortness of breath) (07/04/2020), Stroke (HCC), Stroke (HCC), Subacute confusional state (10/17/2014), SYMPTOM, HYPERSOMNIA NOS (02/19/2007), Thrombosed hemorrhoids, Tick bite of back (11/23/2019), Tinnitus of both ears (08/29/2020), Trouble swallowing, Unspecified hemorrhoids (02/10/2015), Unspecified sleep apnea (11/05/2012), UTI (urinary tract infection) (05/19/2022), Varicose veins of lower extremities with other complications (08/10/2012), Vasomotor rhinitis (06/28/2021), and Vertigo (03/19/2012).   They are presenting to PM&R clinic as a new patient for pain management evaluation. They were referred by Dr. Antonio Meth for treatment of chronic pain.    Chase [name] reports he has had chronic pain for about 10  years.  His speech is limited due to history of CVAs with aphasia.  Patient has neck pain with pain affecting his bilateral arms.  He also has lower back pain and pain in his legs.  He also has burning, pins-and-needles and tingling pain in his feet, suspected to be due to diabetes mellitus.  Walking and standing worsens his pain.  Pending backwards is also painful.  Patient indicates he has pain throughout his entire body.  He reports he was previously followed by Dr. Ernesto for his pain.  Patient reports he had injections in his back or neck and is unsure if this is beneficial.  He indicates he was later followed by other doctors that were prescribing him medications.  He has trouble swallowing pills so is taking liquid hydrocodone .  He says this medication is helping keep his pain under control.  He also takes gabapentin  100 mg 3 times  daily which she feels like is beneficial to the pain in his feet however he does not keep it controlled.   Chart review indicates he was previously diagnosed with fibromyalgia.   He reports history of Raynaud's phenomena   Patient is a poor historian and limitations due to aphasia         Red flag symptoms: No red flags for back pain endorsed in Hx or ROS   Medications tried:   Nsaids - limited by comorbidities  Tylenol  - helps Opiates  hydrocodone   Gabapentin - TID 100mg ,helping  TCAs  -denies, doesn't recall  SNRIs  - effexor  helps with mood      Other treatments: PT- helped in the past  Chiropractor TENs unit - Injections - back or neck injections not sure if this helped  Surgery- denies    Goals for pain control: Decrease overall body wide pain       Interval History 02/26/23 Chase [name] is here for follow-up regarding his back and neck pain, pain related to peripheral neuropathy, fibromyalgia.  He reports that pain is largely unchanged from last visit.  He has not consistently tried taking gabapentin  200 mg 3 times daily and has mostly been taking it at the 100 mg dose.  He has been having more pain in his left great toe, being treated with antibiotics by his PCP.  He has been walking with physical therapy and feels like this has been helping his balance and strength.  Physical therapy has also had a little benefit to his pain.  He reports he forgot to get records sent over from his prior pain clinic.  He says he stopped going there because they started doing more procedure treatments.  He says they referred him to a different provider but he did not follow-up on this.  He reports his pain was better controlled when he was taking the hydrocodone  liquid.  He was able to walk a lot farther and do a lot more at home.   Interval History 03/26/23 Patient is here for follow-up regarding his chronic pain.  Pain is greatest in his neck, back, legs.  Patient does think that Lyrica  has  been beneficial, not having any side effects at this time.  He continues to be frustrated by his aphasia, limiting his ability to communicate.  Interval History 04/23/2023 Chase [name] is here for follow-up regarding his chronic body wide pain.  Pain is the worst in his neck, lower back and legs.  Patient does feel that Lyrica  is reducing his pain, however not keeping it under good control.  Patient  had insurance issues with the pharmacy and was unable to pick up his hydrocodone  liquid, all of this appears to be resolved and pharmacy is ordering the medication for pickup.  Mood continues to be decreased and he feels depressed at times.  No SI or HI.  Interval History 06/25/23 Patient continues to have severe pain in his back and neck.  He also has milder pain in his arms and legs.  Patient reports benefit with Lyrica , feels like it is helping to reduce the pain in his legs. Patient reports liquid hydrocodone  liquid 10 mL providing mild benefit to his pain.patient. Reports it worked much better in the past when he used 15 ml dose.   Patient is difficult historian due to expressive aphasia.  Interval History 07/22/2022 Patient reports continued severe pain throughout his whole back.  The next worst area of pain as throughout his legs.  He also has severe pain in his neck and shoulders.  Hydrocodone  liquid is helping, he is using it sparingly when pain is only very severe.  He continues to use Lyrica  75 mg twice daily.  Reports he is tolerating both of these medications.Occasional constipation controlled with laxatives.  Ontinues to have pins-and-needles pain in his feet.   Interval History 09/04/22 Patient reports continued pain throughout his whole back, worse in the lumbar spine.  Reports also having pain in his hands, shoulders, feet.  Hydrocodone  is helping reduce the back pain and when it is severe.  He is also taking Lyrica  which is helping his overall body wide pain.  Patient reports he tried 1 or 2  sessions of aquatic therapy but stopped going-lack of motivation.  He is willing to try this again.  Communication more difficult due to chronic aphasia.  Interval History 10/19/23 Pt has continued pain in multiple area of his body but it is worse in his lower back. Also a lot of pain in his bilateral shoulders and R wrist.  Patient has not yet started physical therapy, reports he was busy recently.  Hydrocodone  liquid has been helping reduce his pain, often does not last long enough.  This medication has been helping him be more active at home and complete chores around the house.   Interval History 12/07/23 Patient reports he continues to have pain in his lower back that is very severe and limiting.  He has pain in other joints throughout his body but this is less intense.  He reports he did 1 treatment with aquatic therapy at [facility].  He says he did not feel very comfortable being at aquatic therapy and did not pursue further visits.  He continues to use hydrocodone  liquid and Lyrica  which help reduce his pain.  He tries to use hydrocodone  sparingly only when the pain is very severe.  He uses liquid hydrocodone  because he could not swallow the tablets previously, tablets are too big.  Reports he had follow-up with neurosurgery, no surgical intervention recommended.  Pain Inventory Average Pain 6 Pain Right Now 6 My pain is intermittent, constant, sharp, burning, dull, stabbing, tingling, and aching  In the last 24 hours, has pain interfered with the following? General activity 7 Relation with others 7 Enjoyment of life 7 What TIME of day is your pain at its worst? morning , daytime, evening, and night Sleep (in general) Fair  Pain is worse with: walking, bending, sitting, standing, and some activites Pain improves with: Rest, Medication Relief from Meds: 7  Family History  Problem Relation Age of Onset  Breast cancer Mother 56       breast   Huntington's disease Mother    Stroke  Father    Heart disease Father    Hypertension Father    Heart attack Father    Hypertension Brother    Hyperlipidemia Neg Hx    Diabetes Neg Hx    Social History   Socioeconomic History   Marital status: Single    Spouse name: Not on file   Number of children: 0   Years of education: Not on file   Highest education level: 12th grade  Occupational History   Not on file  Tobacco Use   Smoking status: Never   Smokeless tobacco: Never  Vaping Use   Vaping status: Never Used  Substance and Sexual Activity   Alcohol  use: No    Alcohol /week: 0.0 standard drinks of alcohol     Comment: none   Drug use: No   Sexual activity: Never  Other Topics Concern   Not on file  Social History Narrative   No caffeine intake except for chocolate.  Exercised-less walking    Social Drivers of Health   Financial Resource Strain: Patient Declined (11/25/2023)   Overall Financial Resource Strain (CARDIA)    Difficulty of Paying Living Expenses: Patient declined  Food Insecurity: Food Insecurity Present (11/25/2023)   Hunger Vital Sign    Worried About Running Out of Food in the Last Year: Sometimes true    Ran Out of Food in the Last Year: Patient declined  Transportation Needs: Patient Declined (02/17/2023)   PRAPARE - Administrator, Civil Service (Medical): Patient declined    Lack of Transportation (Non-Medical): Patient declined  Physical Activity: Unknown (02/17/2023)   Exercise Vital Sign    Days of Exercise per Week: Patient declined    Minutes of Exercise per Session: Not on file  Stress: Stress Concern Present (11/25/2023)   [employer] of Occupational Health - Occupational Stress Questionnaire    Feeling of Stress : To some extent  Social Connections: Unknown (11/25/2023)   Social Connection and Isolation Panel    Frequency of Communication with Friends and Family: Patient declined    Frequency of Social Gatherings with Friends and Family: Patient declined     Attends Religious Services: Patient declined    Database administrator or Organizations: Patient declined    Attends Engineer, structural: Not on file    Marital Status: Patient declined   Past Surgical History:  Procedure Laterality Date   CATARACT EXTRACTION     x 3   EYE SURGERY  1968, 1985, 1987   cataracts   FLEXIBLE SIGMOIDOSCOPY N/A 02/11/2015   Procedure: FLEXIBLE SIGMOIDOSCOPY;  Surgeon: Lupita FORBES Commander, MD;  Location: [facility];  Service: Endoscopy;  Laterality: N/A;   INNER EAR SURGERY     rt ear   INNER EAR SURGERY  1992   surgery - right arm     1983   Past Surgical History:  Procedure Laterality Date   CATARACT EXTRACTION     x 3   EYE SURGERY  1968, 1985, 1987   cataracts   FLEXIBLE SIGMOIDOSCOPY N/A 02/11/2015   Procedure: FLEXIBLE SIGMOIDOSCOPY;  Surgeon: Lupita FORBES Commander, MD;  Location: [facility];  Service: Endoscopy;  Laterality: N/A;   INNER EAR SURGERY     rt ear   INNER EAR SURGERY  1992   surgery - right arm     1983   Past Medical History:  Diagnosis  Date   Abnormality of gait 11/10/2012   Acute GI bleeding    Acute renal insufficiency 02/10/2015   Acute upper respiratory infection 08/10/2012   Allergic drug reaction 05/24/2020   Allergic rhinitis 10/16/2020   Allergy    ANXIETY DEPRESSION 12/09/2007   Qualifier: Diagnosis of  By: Charlsie RN, Angeline MATSU    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) 03/12/2018   Cerebrovascular accident, old 11/16/2014   Chest discomfort 06/08/2019   Chronic back pain 11/28/2020    Chronic cough 08/27/2014   CXR 08/2014:  Mild hyperinflation, no acute process Spiro 09/2014:  Difficulty with procedure, but essentially normal +CVA's in past, with aspiration risk by history Speech evaluation 2016:      Chronic left shoulder pain 08/06/2020   Chronic pain syndrome 11/23/2019   Chronic pansinusitis 06/30/2018   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   Constipation    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   DDD (degenerative disc disease), cervical 04/26/2019   DDD (degenerative disc disease), lumbar 04/26/2019   Decreased hearing of both ears 02/12/2013   Deviated septum 05/31/2018   Diabetes mellitus without complication (HCC)    Difficulty urinating    DM (diabetes mellitus) type II uncontrolled, periph vascular disorder 01/02/2016   Dry skin 05/09/2019   Dysuria 11/29/2010   Essential (primary) hypertension 01/26/2015   Excessive daytime sleepiness 02/11/2018   FATIGUE 12/09/2007   Qualifier: Diagnosis of  By: Charlsie RN, Angeline MATSU    Fibromyalgia 03/02/2017   Food intolerance 05/09/2019   GERD 12/09/2007   Qualifier: Diagnosis of  By: Viviann Raddle MD, Marsha SAUNDERS    GERD (gastroesophageal reflux disease)    GI bleed 02/10/2015   Headache 12/23/2006   Qualifier: Diagnosis of   By: Charlsie OBIE Angeline MATSU     IMO SNOMED Dx Update Oct 2024     Hearing loss    Hemangioma 02/12/2013   Hemorrhoid 05/22/2011   HTN (hypertension) 02/04/2010   Qualifier: Diagnosis of  By: Domenica MD, Stacey     Hyperlipidemia    Hyperlipidemia associated with type 2 diabetes mellitus (HCC) 11/22/2020   Hyperlipidemia LDL goal <70 01/26/2015   Hypersomnia, persistent 11/05/2012   epworth of 18 points, was placed on CPAP in 2008 with very low AHI ( no number  Mentioned) and struggles ever since with PAP. 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   Leg swelling    Localized swelling of both lower legs 03/02/2017   Low back pain 07/20/2007   Qualifier: Diagnosis of  By: Viviann Raddle MD, Marsha SAUNDERS    Lower extremity edema 07/04/2020   Lower GI bleed  Melena 12/16/2021   Mixed conductive and sensorineural hearing loss of right ear with restricted hearing of left ear 10/16/2020   Nasal congestion    Neck pain 03/15/2020   NEPHROLITHIASIS 08/13/2006   Qualifier: Diagnosis of  By: Geralene Service     Nocturia more than twice per night 02/11/2018   OSA on CPAP 10/17/2014   Otalgia of both ears 06/06/2021   Other chronic pain 03/02/2017   Other fatigue 07/04/2020   Pain in finger of right hand 10/06/2012   Pain in joint of right shoulder 02/21/2017   Palpitations 01/26/2015   Paresthesia 07/25/2010   Persistent atrial fibrillation (HCC) 08/29/2020   Persistent headaches 10/17/2014   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   Qualifier: Diagnosis of  By: Viviann Raddle MD, Marsha R    PONV (postoperative nausea and vomiting)    Preoperative clearance 07/04/2020   Preventative health care 11/26/2011   Prolapsed hemorrhoids 10/05/2013   Radiculopathy, lumbar region 03/15/2020   Rash 05/24/2019   Rash and other nonspecific skin eruption 07/23/2022   Raynaud's disease 03/02/2017   Raynaud's phenomenon 03/02/2017   Rectal bleeding 02/12/2015   Rectal pain    Rectal ulcer with bleeding after hemorrhoid banding 02/11/2015   Rhinitis, chronic 12/23/2006   Qualifier: Diagnosis of  By: Charlsie RN, Regina G    Right shoulder pain 02/15/2015   Seasonal and perennial allergic rhinitis 05/09/2019   Severe episode of recurrent major depressive disorder, 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    Sinus infection 06/30/2014   Sinusitis, acute, maxillary 02/02/2014   Sleep apnea    Sleep apnea with use of continuous positive airway pressure (CPAP) 02/11/2018   SOB (shortness of breath) 07/04/2020   Stroke (HCC)    Stroke (HCC)    Subacute confusional state 10/17/2014   SYMPTOM, HYPERSOMNIA NOS 02/19/2007   Qualifier: Diagnosis of  By: Winona Raring     Thrombosed hemorrhoids    Tick bite of back 11/23/2019   Tinnitus of both ears 08/29/2020   Trouble swallowing    Unspecified hemorrhoids 02/10/2015   UTI (urinary tract infection) 05/19/2022   Varicose veins of bilateral lower extremities with other complications 08/10/2012   IMO SNOMED Dx Update Oct 2024     Vasomotor rhinitis 06/28/2021   Vertigo 03/19/2012   BP 130/79   Pulse 79   Ht 5' 9.5 (1.765 m)   Wt 170 lb 6.4 oz (77.3 kg)   SpO2 95%   BMI 24.80 kg/m   Opioid Risk Score:   Fall Risk Score:  `1  Depression screen Chase [name] 2/9     10/19/2023    3:25 PM 09/04/2023    3:46 PM 07/23/2023    2:10 PM 06/25/2023    2:54 PM 04/23/2023    3:16 PM 03/26/2023    3:11 PM 01/15/2023    3:06 PM  Depression screen PHQ 2/9  Decreased Interest  2 1  2  0 2  Down, Depressed, Hopeless  1 1  2  0 2  PHQ - 2 Score  3 2  4  0 4  Altered sleeping 2 2  2   3   Tired, decreased energy 2 2  2   2   Change in appetite  1     2  Feeling bad or failure about yourself   2     2  Trouble concentrating  2     2  Moving slowly or fidgety/restless  3     3  Suicidal thoughts  0  0  PHQ-9 Score  15     18  Difficult doing work/chores  Somewhat difficult     Somewhat difficult      Review of Systems  Constitutional: Negative.   HENT:  Negative for voice change.   Eyes:  Positive for visual disturbance.  Respiratory: Negative.    Cardiovascular: Negative.   Gastrointestinal: Negative.   Endocrine: Negative.   Genitourinary: Negative.   Musculoskeletal:  Positive for back pain, gait problem and neck pain. Negative for neck stiffness.        Pain in both shoulders, Feet, legs, arms  Skin: Negative.   Allergic/Immunologic: Negative.   Neurological:  Positive for dizziness, weakness, light-headedness and headaches.       Weakness in both hands  Hematological: Negative.   Psychiatric/Behavioral:  Positive for dysphoric mood.   All other systems reviewed and are negative.      Objective:   Physical Exam  Gen: no distress, normal appearing HEENT: oral mucosa pink and moist, NCAT Chest: normal effort, normal rate of breathing Abd: soft, non-distended Ext: no edema Psych: Frustrated with his language deficits skin: Warm and dry Neuro: Alert and awake, follows commands, cranial nerves II through XII grossly intact Patient has significant aphasia, primarily expressive Sensory exam normal for light touch in all 4 limbs-intact to light touch in both feet throughout   Musculoskeletal:  Kyphotic posture Facet loading positive, pain with spinal extension Patient with mild diffuse tenderness to palpation throughout bilateral upper and lower extremities L-spine TTP Mild TTP C-spine SLR negative bilaterally  Lumbar spine MRI 10/19/23 IEXAM: MRI LUMBAR SPINE WITHOUT CONTRAST   TECHNIQUE: Multiplanar, multisequence MR imaging of the lumbar spine was performed. No intravenous contrast was administered.   COMPARISON:  MRI of the lumbar spine dated 06/04/2021   FINDINGS: Segmentation: Standard.   Alignment:  Physiologic lumbar alignment is maintained.   Vertebrae: Vertebral bodies demonstrate normal signal intensity. No compression fractures.   Conus medullaris and cauda equina: The conus medullaris terminates at the level of L1-L2. The distal spinal cord signal intensity is normal. Stable intradural lesion at the level of L2 measuring approximately 1 cm.   Paraspinal and other soft tissues: Right renal cysts. The visualized aorta is normal.   Disc levels:   L1-L2: Disc is normal in configuration. No facet  arthropathy. No neuroforaminal stenosis. No spinal canal stenosis.   L2-L3: Disc is normal in configuration. Moderate bilateral facet arthropathy. No neuroforaminal stenosis. No spinal canal stenosis.   L3-L4: Disc bulge. Moderate bilateral facet arthropathy. Mild bilateral neuroforaminal stenosis. Mild spinal canal stenosis.   L4-L5: Disc bulge. Moderate bilateral facet arthropathy. Moderate bilateral neuroforaminal stenosis. Mild spinal canal stenosis.   L5-S1: Disc bulge. Moderate bilateral facet arthropathy. Mild bilateral neuroforaminal stenosis. No spinal canal stenosis.   IMPRESSION: 1. Mild canal stenoses at L3-L4 and L4-L5 secondary to disc bulging and facet arthropathy. 2. Moderate foraminal stenosis bilaterally at L4-L5. 3. 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  Denies SI or HI 5) History of  CVA.  Patient continues to have aphasia   Plan 1) Dc Gabapentin  previously, Continue Lyrica  100 mg twice daily.  He denies any side effects with current regimen 2) Previously on liquid hydrocodone , Continue Hycet 7.5/15ml, 10-15ml Q8H PRN.  Ordered. He has a hard time swallowing larger pills.  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   I called prior clinic and requested for records to be faxed over previously 5) Advised to call to schedule [facility] - ordered prior  visit [facility]-patient reports he tried 1 visit-he is considering going again.  Provided home exercises for him to complete 3 times a week 6) Continue venlafaxine  XR 150 mg daily 7) continue Flexeril  as needed 8) consider Qutenza if polyneuropathy pain does not improve with oral medications 9) MRI with Lumbar spondylosis multiple levels- consider MBB at later time as he may have component of facet mediated pain 10) continue monitor UDS, PDMP, pill counts-patient brought his hydrocodone  bottle today 11) Referral to NSGY prior visit- reports no surgery or medications were planned/changed at the visit  12) Narcan  ordered prior visit

## 2023-12-08 ENCOUNTER — Ambulatory Visit: Admitting: Orthopaedic Surgery

## 2023-12-08 DIAGNOSIS — R2231 Localized swelling, mass and lump, right upper limb: Secondary | ICD-10-CM

## 2023-12-08 NOTE — Progress Notes (Signed)
 Office Visit Note   Patient: Chase [name]           Date of Birth: 11-16-1960           MRN: [id number] Visit Date: 12/08/2023              Requested by: [address], [place], [place] [address] [place],  [place] [address] PCP: Chase [name], Chase SAUNDERS, DO   Assessment & Plan: Visit Diagnoses:  1. Mass of right hand     Plan: Chase [name] is a 63 year old gentleman with a dorsal right hand mass.  Will order MRI to evaluate mass.  Follow-up after the MRI.  Follow-Up Instructions: No follow-ups on file.   Orders:  Orders Placed This Encounter  Procedures   MR HAND RIGHT W WO CONTRAST   No orders of the defined types were placed in this encounter.     Procedures: No procedures performed   Clinical Data: No additional findings.   Subjective: Chief Complaint  Patient presents with   Right Wrist - Pain    HPI Chase [name] is a 63 year old gentleman here for evaluation of right hand mass.  Referral from PCP.  He has had the mass for about 6 weeks.  Risk because that he has had 2 falls.  States that he has not memory issues.  Right-hand-dominant. Review of Systems  Constitutional: Negative.   HENT: Negative.    Eyes: Negative.   Respiratory: Negative.    Cardiovascular: Negative.   Gastrointestinal: Negative.   Endocrine: Negative.   Genitourinary: Negative.   Skin: Negative.   Allergic/Immunologic: Negative.   Neurological: Negative.   Hematological: Negative.   Psychiatric/Behavioral: Negative.    All other systems reviewed and are negative.    Objective: Vital Signs: There were no vitals taken for this visit.  Physical Exam Vitals and nursing note reviewed.  Constitutional:      Appearance: He is well-developed.  HENT:     Head: Normocephalic and atraumatic.   Eyes:     Pupils: Pupils are equal, round, and reactive to light.   Pulmonary:     Effort: Pulmonary effort is normal.  Abdominal:     Palpations: Abdomen is soft.   Musculoskeletal:         General: Normal range of motion.     Cervical back: Neck supple.   Skin:    General: Skin is warm.   Neurological:     Mental Status: He is alert and oriented to person, place, and time.   Psychiatric:        Behavior: Behavior normal.        Thought Content: Thought content normal.        Judgment: Judgment normal.     Ortho Exam Exam of the right hand shows a pea-sized dorsal mass over the fifth metacarpal of the right hand.  No neurovasc compromise to the hand.  No other findings. Specialty Comments:  No specialty comments available.  Imaging: No results found. Prior x-rays independently reviewed and interpreted is unremarkable.  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   Increasing residual urine 03/10/2022   Melena 12/16/2021   Vasomotor rhinitis 06/28/2021   Otalgia of both ears 06/06/2021   Brain  cyst 01/09/2021   Chronic back pain 11/28/2020   Hyperlipidemia associated with type 2 diabetes mellitus (HCC) 11/22/2020   Mixed conductive and sensorineural hearing loss of right ear with restricted hearing of left ear 10/16/2020   Allergic rhinitis 10/16/2020   Constipation    Hearing loss    Severe episode of recurrent major depressive disorder, 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   Hypokalemia 08/29/2020   BPH without urinary obstruction 08/29/2020   Tinnitus of both ears 08/29/2020   Persistent atrial fibrillation (HCC) 08/29/2020   Chronic left shoulder pain 08/06/2020   Allergy    Arthritis    Blood in stool    Diabetes mellitus without complication (HCC)    GERD (gastroesophageal reflux disease)    Hypertension    Leg swelling    Nasal congestion    PONV  (postoperative nausea and vomiting)    Rectal pain    Thrombosed hemorrhoids    Trouble swallowing    Lower extremity edema 07/04/2020   Other fatigue 07/04/2020   Preoperative clearance 07/04/2020   Hypotension 07/04/2020   SOB (shortness of breath) 07/04/2020   Allergic drug reaction 05/24/2020   Body mass index (BMI) 26.0-26.9, adult 03/15/2020   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   Radiculopathy, lumbar region 03/15/2020   Neck pain 03/15/2020   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/23/2019   Hyperlipidemia 11/23/2019   Bilateral hearing loss 11/23/2019   Tick bite of back 11/23/2019   Chronic pain syndrome 11/23/2019   Chest discomfort 06/08/2019   Benign neoplasm of brain (HCC) 05/24/2019   Rash 05/24/2019   Dry skin 05/09/2019   Seasonal and perennial allergic rhinitis 05/09/2019   Food intolerance 05/09/2019   DDD (degenerative disc disease), cervical 04/26/2019   DDD (degenerative disc disease), lumbar 04/26/2019   Chronic pansinusitis 06/30/2018   Deviated septum 05/31/2018   Insect bite 03/16/2018   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) 03/12/2018   Excessive daytime sleepiness 02/11/2018   Sleep apnea with use of continuous positive airway pressure (CPAP) 02/11/2018   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   Nocturia more than twice per night 02/11/2018   Bilateral leg and foot pain 03/02/2017   Fibromyalgia 03/02/2017   Localized swelling of both lower legs 03/02/2017   Other chronic pain 03/02/2017   Raynaud's disease 03/02/2017   Raynaud's phenomenon 03/02/2017   Pain in joint of right shoulder 02/21/2017   DM (diabetes mellitus) type II uncontrolled, periph vascular disorder 01/02/2016   Right shoulder pain 02/15/2015   Rectal bleeding 02/12/2015   Rectal ulcer with bleeding after hemorrhoid banding 02/11/2015   Lower GI bleed    Blood in stool, frank 02/10/2015   Acute renal insufficiency  02/10/2015   Unspecified hemorrhoids 02/10/2015   GI bleed 02/10/2015   Acute GI bleeding    Stroke (HCC) 01/26/2015   Palpitations 01/26/2015   Essential (primary) hypertension 01/26/2015   Hyperlipidemia LDL goal <70 01/26/2015   Cerebrovascular accident, old 11/16/2014   Internal hemorrhoids 10/22/2014   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 10/17/2014   OSA on CPAP 10/17/2014   Persistent headaches 10/17/2014   Subacute confusional state 10/17/2014   Chronic cough 08/27/2014   Sinus infection 06/30/2014   Sinusitis, acute, maxillary 02/02/2014   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   Prolapsed hemorrhoids 10/05/2013   Hemangioma 02/12/2013   Decreased hearing of both ears 02/12/2013   Abnormality of gait 11/10/2012   Hypersomnia, persistent 11/05/2012  Sleep apnea 11/05/2012   Pain in finger of right hand 10/06/2012   Internal and external hemorrhoids without complication 10/04/2012   Varicose veins of bilateral lower extremities with other complications 08/10/2012   Acute upper respiratory infection 08/10/2012   Bigeminy ? 03/22/2012   Vertigo 03/19/2012   Preventative health care 11/26/2011   Sleep apnea 11/03/2011   Insomnia 08/05/2011   Difficulty urinating    Hemorrhoid 05/22/2011   Dysuria 11/29/2010   Arthralgia 07/25/2010   Paresthesia 07/25/2010   HTN (hypertension) 02/04/2010   ANXIETY DEPRESSION 12/09/2007   GERD 12/09/2007   FATIGUE 12/09/2007   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   Low back pain 07/20/2007   SYMPTOM, HYPERSOMNIA NOS 02/19/2007   Rhinitis, chronic 12/23/2006   Headache 12/23/2006   NEPHROLITHIASIS 08/13/2006   Past Medical History:  Diagnosis Date   Abnormality of gait 11/10/2012   Acute GI bleeding    Acute renal insufficiency 02/10/2015   Acute upper respiratory infection 08/10/2012   Allergic drug reaction 05/24/2020   Allergic rhinitis 10/16/2020   Allergy    ANXIETY DEPRESSION 12/09/2007    Qualifier: Diagnosis of  By: Charlsie RN, Angeline MATSU    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) 03/12/2018   Cerebrovascular accident, old 11/16/2014   Chest discomfort 06/08/2019   Chronic back pain 11/28/2020   Chronic cough 08/27/2014   CXR 08/2014:  Mild hyperinflation, no acute process Spiro 09/2014:  Difficulty with procedure, but essentially normal +CVA's in past, with aspiration risk by history Speech evaluation 2016:      Chronic left shoulder pain 08/06/2020   Chronic pain syndrome 11/23/2019   Chronic pansinusitis 06/30/2018   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   Constipation    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   DDD (degenerative disc disease), cervical 04/26/2019   DDD (degenerative disc disease), lumbar 04/26/2019   Decreased hearing of both ears 02/12/2013   Deviated septum 05/31/2018   Diabetes mellitus without complication (HCC)    Difficulty urinating    DM (diabetes mellitus) type II uncontrolled, periph vascular disorder 01/02/2016   Dry skin 05/09/2019   Dysuria 11/29/2010   Essential (primary) hypertension 01/26/2015   Excessive daytime sleepiness 02/11/2018   FATIGUE 12/09/2007   Qualifier: Diagnosis of  By: Charlsie OBIE Angeline MATSU    Fibromyalgia 03/02/2017   Food intolerance 05/09/2019   GERD 12/09/2007   Qualifier: Diagnosis of  By: Viviann Raddle MD, Marsha [name]    GERD (gastroesophageal reflux disease)    GI bleed 02/10/2015   Headache 12/23/2006   Qualifier: Diagnosis of   By: Charlsie OBIE Angeline MATSU     IMO SNOMED  Dx Update Oct 2024     Hearing loss    Hemangioma 02/12/2013   Hemorrhoid 05/22/2011   HTN (hypertension) 02/04/2010   Qualifier: Diagnosis of  By: Domenica MD, Stacey     Hyperlipidemia    Hyperlipidemia associated with type 2 diabetes mellitus (HCC) 11/22/2020   Hyperlipidemia LDL goal <70 01/26/2015   Hypersomnia, persistent 11/05/2012  epworth of 18 points, was placed on CPAP in 2008 with very low AHI ( no number  Mentioned) and struggles ever since with PAP. 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   Leg swelling    Localized swelling of both lower legs 03/02/2017   Low back pain 07/20/2007   Qualifier: Diagnosis of  By: Viviann Raddle MD, Marsha [name]    Lower extremity edema 07/04/2020   Lower GI bleed    Melena 12/16/2021   Mixed conductive and sensorineural hearing loss of right ear with restricted hearing of left ear 10/16/2020   Nasal congestion    Neck pain 03/15/2020   NEPHROLITHIASIS 08/13/2006   Qualifier: Diagnosis of  By: Geralene Service     Nocturia more than twice per night 02/11/2018   OSA on CPAP 10/17/2014   Otalgia of both ears 06/06/2021   Other chronic pain 03/02/2017   Other fatigue 07/04/2020   Pain in finger of right hand 10/06/2012   Pain in joint of right shoulder 02/21/2017   Palpitations 01/26/2015   Paresthesia 07/25/2010   Persistent atrial fibrillation (HCC) 08/29/2020   Persistent headaches 10/17/2014   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   Qualifier: Diagnosis of  By: Viviann Raddle MD, Marsha R    PONV (postoperative nausea and vomiting)    Preoperative clearance 07/04/2020    Preventative health care 11/26/2011   Prolapsed hemorrhoids 10/05/2013   Radiculopathy, lumbar region 03/15/2020   Rash 05/24/2019   Rash and other nonspecific skin eruption 07/23/2022   Raynaud's disease 03/02/2017   Raynaud's phenomenon 03/02/2017   Rectal bleeding 02/12/2015   Rectal pain    Rectal ulcer with bleeding after hemorrhoid banding 02/11/2015   Rhinitis, chronic 12/23/2006   Qualifier: Diagnosis of  By: Charlsie RN, Regina G    Right shoulder pain 02/15/2015   Seasonal and perennial allergic rhinitis 05/09/2019   Severe episode of recurrent major depressive disorder, 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   Sinus infection 06/30/2014   Sinusitis, acute, maxillary 02/02/2014   Sleep apnea    Sleep apnea with use of continuous positive airway pressure (CPAP) 02/11/2018   SOB (shortness of breath) 07/04/2020   Stroke (HCC)    Stroke (HCC)    Subacute confusional state 10/17/2014   SYMPTOM, HYPERSOMNIA NOS 02/19/2007   Qualifier: Diagnosis of  By: Winona Raring     Thrombosed hemorrhoids    Tick bite of back 11/23/2019   Tinnitus of both ears 08/29/2020   Trouble swallowing    Unspecified hemorrhoids 02/10/2015   UTI (urinary tract infection) 05/19/2022   Varicose veins of bilateral lower extremities with other complications 08/10/2012   IMO SNOMED Dx Update Oct 2024     Vasomotor rhinitis 06/28/2021   Vertigo 03/19/2012    Family History  Problem Relation Age of Onset   Breast cancer Mother 31       breast   Huntington's disease Mother    Stroke Father    Heart disease Father    Hypertension Father    Heart attack Father    Hypertension Brother    Hyperlipidemia Neg Hx    Diabetes Neg Hx  Past Surgical History:  Procedure Laterality Date   CATARACT EXTRACTION     x 3   EYE SURGERY  1968, 1985, 1987   cataracts   FLEXIBLE SIGMOIDOSCOPY N/A 02/11/2015   Procedure: FLEXIBLE SIGMOIDOSCOPY;  Surgeon: Lupita FORBES Commander, MD;  Location: [facility];   Service: Endoscopy;  Laterality: N/A;   INNER EAR SURGERY     rt ear   INNER EAR SURGERY  1992   surgery - right arm     1983   Social History   Occupational History   Not on file  Tobacco Use   Smoking status: Never   Smokeless tobacco: Never  Vaping Use   Vaping status: Never Used  Substance and Sexual Activity   Alcohol  use: No    Alcohol /week: 0.0 standard drinks of alcohol     Comment: none   Drug use: No   Sexual activity: Never

## 2023-12-09 ENCOUNTER — Encounter: Payer: Self-pay | Admitting: Orthopaedic Surgery

## 2023-12-21 ENCOUNTER — Ambulatory Visit
Admission: RE | Admit: 2023-12-21 | Discharge: 2023-12-21 | Disposition: A | Discharge status: Home / Self Care | Source: Ambulatory Visit | Attending: Orthopaedic Surgery | Admitting: Orthopaedic Surgery

## 2023-12-21 DIAGNOSIS — R2231 Localized swelling, mass and lump, right upper limb: Secondary | ICD-10-CM | POA: Diagnosis not present

## 2023-12-21 MED ORDER — GADOPICLENOL 0.5 MMOL/ML IV SOLN
8.0000 mL | Freq: Once | INTRAVENOUS | Status: AC | PRN
  Administered 2023-12-21: 8 mL via INTRAVENOUS

## 2023-12-25 ENCOUNTER — Other Ambulatory Visit: Payer: Self-pay | Admitting: Family Medicine

## 2023-12-29 ENCOUNTER — Ambulatory Visit: Admitting: Orthopaedic Surgery

## 2023-12-29 DIAGNOSIS — R2231 Localized swelling, mass and lump, right upper limb: Secondary | ICD-10-CM

## 2023-12-29 NOTE — Progress Notes (Signed)
 Office Visit Note   Patient: Chase [name]           Date of Birth: 08-21-1960           MRN: [id number] Visit Date: 12/29/2023              Requested by: [address], [place], [place] [address] [place],  [place] [address] PCP: Antonio Meth, Jamee SAUNDERS, DO   Assessment & Plan: Visit Diagnoses:  1. Mass of right hand     Plan: History of Present Illness The patient presents for discussion of right hand MRI scan  They experience significant and persistent pain in their hand, severe enough to cause discomfort during an MRI, affecting imaging quality. A firm lesion on the hand remains sore and was not present before, causing concern.  Physical Exam MUSCULOSKELETAL: Examination of right hand is unchanged from prior visit.  Results RADIOLOGY Hand MRI: Limited visualization of lesion due to patient movement; findings are reassuring for the absence of malignancy.  Assessment and Plan Lesion on hand Lesion firm, decreased in size. MRI inconclusive but no malignancy indicated. Differential: benign lesion or bony exostosis. - Provided contact for Dr. Bethanne, hand surgeon, for evaluation and potential surgery. - Discussed monitoring option if lesion not bothersome. - Explained ultrasound unlikely to add diagnostic value.  Follow-Up Instructions: No follow-ups on file.   Orders:  No orders of the defined types were placed in this encounter.  No orders of the defined types were placed in this encounter.     Procedures: No procedures performed   Clinical Data: No additional findings.   Subjective: Chief Complaint  Patient presents with   Right Hand - Pain, Follow-up    HPI  Review of Systems  Constitutional: Negative.   HENT: Negative.    Eyes: Negative.   Respiratory: Negative.    Cardiovascular: Negative.   Gastrointestinal: Negative.   Endocrine: Negative.   Genitourinary: Negative.   Skin: Negative.   Allergic/Immunologic: Negative.    Neurological: Negative.   Hematological: Negative.   Psychiatric/Behavioral: Negative.    All other systems reviewed and are negative.    Objective: Vital Signs: There were no vitals taken for this visit.  Physical Exam Vitals and nursing note reviewed.  Constitutional:      Appearance: He is well-developed.  HENT:     Head: Normocephalic and atraumatic.  Eyes:     Pupils: Pupils are equal, round, and reactive to light.  Pulmonary:     Effort: Pulmonary effort is normal.  Abdominal:     Palpations: Abdomen is soft.  Musculoskeletal:        General: Normal range of motion.     Cervical back: Neck supple.  Skin:    General: Skin is warm.  Neurological:     Mental Status: He is alert and oriented to person, place, and time.  Psychiatric:        Behavior: Behavior normal.        Thought Content: Thought content normal.        Judgment: Judgment normal.     Ortho Exam  Specialty Comments:  No specialty comments available.  Imaging: No results found.   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   Increasing residual urine 03/10/2022   Melena 12/16/2021   Vasomotor rhinitis 06/28/2021   Otalgia of both ears 06/06/2021   Brain cyst 01/09/2021   Chronic back pain 11/28/2020   Hyperlipidemia associated with type 2 diabetes mellitus (HCC) 11/22/2020   Mixed conductive and sensorineural hearing loss of right ear with restricted hearing of left ear 10/16/2020   Allergic rhinitis 10/16/2020   Constipation    Hearing loss    Severe episode of recurrent major depressive disorder, 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   Hypokalemia 08/29/2020   BPH without urinary obstruction 08/29/2020    Tinnitus of both ears 08/29/2020   Persistent atrial fibrillation (HCC) 08/29/2020   Chronic left shoulder pain 08/06/2020   Allergy    Arthritis    Blood in stool    Diabetes mellitus without complication (HCC)    GERD (gastroesophageal reflux disease)    Hypertension    Leg swelling    Nasal congestion    PONV (postoperative nausea and vomiting)    Rectal pain    Thrombosed hemorrhoids    Trouble swallowing    Lower extremity edema 07/04/2020   Other fatigue 07/04/2020   Preoperative clearance 07/04/2020   Hypotension 07/04/2020   SOB (shortness of breath) 07/04/2020   Allergic drug reaction 05/24/2020   Body mass index (BMI) 26.0-26.9, adult 03/15/2020   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   Radiculopathy, lumbar region 03/15/2020   Neck pain 03/15/2020   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/23/2019   Hyperlipidemia 11/23/2019   Bilateral hearing loss 11/23/2019   Tick bite of back 11/23/2019   Chronic pain syndrome 11/23/2019   Chest discomfort 06/08/2019   Benign neoplasm of brain (HCC) 05/24/2019   Rash 05/24/2019   Dry skin 05/09/2019   Seasonal and perennial allergic rhinitis 05/09/2019   Food intolerance 05/09/2019   DDD (degenerative disc disease), cervical 04/26/2019   DDD (degenerative disc disease), lumbar 04/26/2019   Chronic pansinusitis 06/30/2018   Deviated septum 05/31/2018   Insect bite 03/16/2018   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) 03/12/2018   Excessive daytime sleepiness 02/11/2018   Sleep apnea with use of continuous positive airway pressure (CPAP) 02/11/2018   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   Nocturia more than twice per night 02/11/2018   Bilateral leg and foot pain 03/02/2017   Fibromyalgia 03/02/2017   Localized swelling of both lower legs 03/02/2017   Other chronic pain 03/02/2017   Raynaud's disease 03/02/2017   Raynaud's phenomenon 03/02/2017   Pain in joint  of right shoulder 02/21/2017   DM (diabetes mellitus) type II uncontrolled, periph vascular disorder 01/02/2016   Right shoulder pain 02/15/2015   Rectal bleeding 02/12/2015   Rectal ulcer with bleeding after hemorrhoid banding 02/11/2015   Lower GI bleed    Blood in stool, frank 02/10/2015   Acute renal insufficiency 02/10/2015   Unspecified hemorrhoids 02/10/2015   GI bleed 02/10/2015   Acute GI bleeding    Stroke (HCC) 01/26/2015   Palpitations 01/26/2015   Essential (primary) hypertension 01/26/2015   Hyperlipidemia LDL goal <70 01/26/2015   Cerebrovascular accident, old 11/16/2014   Internal hemorrhoids 10/22/2014   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 10/17/2014   OSA on CPAP 10/17/2014   Persistent headaches 10/17/2014   Subacute confusional state 10/17/2014   Chronic cough 08/27/2014   Sinus infection 06/30/2014   Sinusitis, acute, maxillary 02/02/2014   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  Prolapsed hemorrhoids 10/05/2013   Hemangioma 02/12/2013   Decreased hearing of both ears 02/12/2013   Abnormality of gait 11/10/2012   Hypersomnia, persistent 11/05/2012   Sleep apnea 11/05/2012   Pain in finger of right hand 10/06/2012   Internal and external hemorrhoids without complication 10/04/2012   Varicose veins of bilateral lower extremities with other complications 08/10/2012   Acute upper respiratory infection 08/10/2012   Bigeminy ? 03/22/2012   Vertigo 03/19/2012   Preventative health care 11/26/2011   Sleep apnea 11/03/2011   Insomnia 08/05/2011   Difficulty urinating    Hemorrhoid 05/22/2011   Dysuria 11/29/2010   Arthralgia 07/25/2010   Paresthesia 07/25/2010   HTN (hypertension) 02/04/2010   ANXIETY DEPRESSION 12/09/2007   GERD 12/09/2007   FATIGUE 12/09/2007   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   Low back pain 07/20/2007   SYMPTOM, HYPERSOMNIA NOS 02/19/2007   Rhinitis, chronic 12/23/2006   Headache 12/23/2006    NEPHROLITHIASIS 08/13/2006   Past Medical History:  Diagnosis Date   Abnormality of gait 11/10/2012   Acute GI bleeding    Acute renal insufficiency 02/10/2015   Acute upper respiratory infection 08/10/2012   Allergic drug reaction 05/24/2020   Allergic rhinitis 10/16/2020   Allergy    ANXIETY DEPRESSION 12/09/2007   Qualifier: Diagnosis of  By: Charlsie RN, Angeline MATSU    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) 03/12/2018   Cerebrovascular accident, old 11/16/2014   Chest discomfort 06/08/2019   Chronic back pain 11/28/2020   Chronic cough 08/27/2014   CXR 08/2014:  Mild hyperinflation, no acute process Spiro 09/2014:  Difficulty with procedure, but essentially normal +CVA's in past, with aspiration risk by history Speech evaluation 2016:      Chronic left shoulder pain 08/06/2020   Chronic pain syndrome 11/23/2019   Chronic pansinusitis 06/30/2018   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   Constipation    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   DDD (degenerative disc disease), cervical 04/26/2019   DDD (degenerative disc disease), lumbar 04/26/2019   Decreased hearing of both ears 02/12/2013   Deviated septum 05/31/2018   Diabetes mellitus without complication (HCC)    Difficulty urinating    DM (diabetes mellitus) type II uncontrolled, periph vascular disorder 01/02/2016   Dry skin 05/09/2019   Dysuria 11/29/2010   Essential (primary) hypertension 01/26/2015   Excessive daytime sleepiness 02/11/2018   FATIGUE 12/09/2007   Qualifier:  Diagnosis of  By: Charlsie OBIE Angeline MATSU    Fibromyalgia 03/02/2017   Food intolerance 05/09/2019   GERD 12/09/2007   Qualifier: Diagnosis of  By: Viviann Raddle MD, Marsha SAUNDERS    GERD (gastroesophageal reflux disease)    GI bleed 02/10/2015   Headache 12/23/2006   Qualifier: Diagnosis of   By: Charlsie OBIE Angeline MATSU     IMO SNOMED Dx Update Oct 2024     Hearing loss    Hemangioma 02/12/2013   Hemorrhoid 05/22/2011   HTN (hypertension) 02/04/2010   Qualifier: Diagnosis of  By: Domenica MD, Harlene  Hyperlipidemia    Hyperlipidemia associated with type 2 diabetes mellitus (HCC) 11/22/2020   Hyperlipidemia LDL goal <70 01/26/2015   Hypersomnia, persistent 11/05/2012   epworth of 18 points, was placed on CPAP in 2008 with very low AHI ( no number  Mentioned) and struggles ever since with PAP. 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   Leg swelling    Localized swelling of both lower legs 03/02/2017   Low back pain 07/20/2007   Qualifier: Diagnosis of  By: Viviann Raddle MD, Marsha SAUNDERS    Lower extremity edema 07/04/2020   Lower GI bleed    Melena 12/16/2021   Mixed conductive and sensorineural hearing loss of right ear with restricted hearing of left ear 10/16/2020   Nasal congestion    Neck pain 03/15/2020   NEPHROLITHIASIS 08/13/2006   Qualifier: Diagnosis of  By: Geralene Service     Nocturia more than twice per night 02/11/2018   OSA on CPAP 10/17/2014   Otalgia of both ears 06/06/2021   Other chronic pain 03/02/2017   Other fatigue 07/04/2020   Pain in finger of right hand 10/06/2012   Pain in joint of right shoulder  02/21/2017   Palpitations 01/26/2015   Paresthesia 07/25/2010   Persistent atrial fibrillation (HCC) 08/29/2020   Persistent headaches 10/17/2014   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   Qualifier: Diagnosis of  By: Viviann Raddle MD, Marsha R    PONV (postoperative nausea and vomiting)    Preoperative clearance 07/04/2020   Preventative health care 11/26/2011   Prolapsed hemorrhoids 10/05/2013   Radiculopathy, lumbar region 03/15/2020   Rash 05/24/2019   Rash and other nonspecific skin eruption 07/23/2022   Raynaud's disease 03/02/2017   Raynaud's phenomenon 03/02/2017   Rectal bleeding 02/12/2015   Rectal pain    Rectal ulcer with bleeding after hemorrhoid banding 02/11/2015   Rhinitis, chronic 12/23/2006   Qualifier: Diagnosis of  By: Charlsie RN, Regina G    Right shoulder pain 02/15/2015   Seasonal and perennial allergic rhinitis 05/09/2019   Severe episode of recurrent major depressive disorder, 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   Sinus infection 06/30/2014   Sinusitis, acute, maxillary 02/02/2014   Sleep apnea    Sleep apnea with use of continuous positive airway pressure (CPAP) 02/11/2018   SOB (shortness of breath) 07/04/2020   Stroke (HCC)    Stroke (HCC)    Subacute confusional state 10/17/2014   SYMPTOM, HYPERSOMNIA NOS 02/19/2007   Qualifier: Diagnosis of  By: Winona Raring     Thrombosed hemorrhoids    Tick bite of back 11/23/2019   Tinnitus of both ears 08/29/2020   Trouble swallowing    Unspecified hemorrhoids 02/10/2015   UTI (urinary tract infection) 05/19/2022   Varicose veins of bilateral lower extremities with other complications 08/10/2012   IMO SNOMED Dx Update Oct 2024     Vasomotor rhinitis 06/28/2021   Vertigo 03/19/2012    Family History  Problem Relation Age of Onset   Breast cancer Mother 36       breast   Huntington's disease Mother    Stroke Father    Heart disease Father    Hypertension  Father    Heart attack Father    Hypertension  Brother    Hyperlipidemia Neg Hx    Diabetes Neg Hx     Past Surgical History:  Procedure Laterality Date   CATARACT EXTRACTION     x 3   EYE SURGERY  1968, 1985, 1987   cataracts   FLEXIBLE SIGMOIDOSCOPY N/A 02/11/2015   Procedure: FLEXIBLE SIGMOIDOSCOPY;  Surgeon: Lupita FORBES Commander, MD;  Location: [facility];  Service: Endoscopy;  Laterality: N/A;   INNER EAR SURGERY     rt ear   INNER EAR SURGERY  1992   surgery - right arm     1983   Social History   Occupational History   Not on file  Tobacco Use   Smoking status: Never   Smokeless tobacco: Never  Vaping Use   Vaping status: Never Used  Substance and Sexual Activity   Alcohol  use: No    Alcohol /week: 0.0 standard drinks of alcohol     Comment: none   Drug use: No   Sexual activity: Never

## 2023-12-30 ENCOUNTER — Other Ambulatory Visit: Payer: Self-pay | Admitting: Family Medicine

## 2024-01-04 ENCOUNTER — Other Ambulatory Visit: Payer: Self-pay | Admitting: Family Medicine

## 2024-01-06 ENCOUNTER — Encounter: Payer: Self-pay | Admitting: Internal Medicine

## 2024-01-07 ENCOUNTER — Telehealth: Payer: Self-pay

## 2024-01-07 ENCOUNTER — Other Ambulatory Visit (HOSPITAL_COMMUNITY): Payer: Self-pay

## 2024-01-07 NOTE — Telephone Encounter (Signed)
 Pt needs a PA for Jardiance . Last PA expired in April

## 2024-01-07 NOTE — Telephone Encounter (Signed)
 Pharmacy Patient Advocate Encounter   Received notification from Pt Calls Messages that prior authorization for Jardiance  25mg  is required/requested.   Insurance verification completed.   The patient is insured through [facility] .   Per test claim: PA required; PA submitted to above mentioned insurance via CoverMyMeds Key/confirmation #/EOC [id number] Status is pending

## 2024-01-08 ENCOUNTER — Telehealth: Payer: Self-pay

## 2024-01-08 MED ORDER — EMPAGLIFLOZIN 25 MG PO TABS: 25.0000 mg | ORAL_TABLET | Freq: Every day | ORAL | Status: AC

## 2024-01-08 NOTE — Telephone Encounter (Signed)
 Patient will pick up samples of Jardiance  25mg 

## 2024-01-18 NOTE — Telephone Encounter (Signed)
 Pharmacy Patient Advocate Encounter  Received notification from [employer] that Prior Authorization for Jardiance  25mg  tablets has been APPROVED from 01/07/24 to 01/06/25   PA #/Case ID/Reference #: [id number]

## 2024-02-01 ENCOUNTER — Encounter: Attending: Physical Medicine & Rehabilitation | Admitting: Physical Medicine & Rehabilitation

## 2024-02-01 ENCOUNTER — Encounter: Payer: Self-pay | Admitting: Physical Medicine & Rehabilitation

## 2024-02-01 VITALS — BP 132/83 | HR 77 | Resp 16 | Ht 69.5 in | Wt 172.2 lb

## 2024-02-01 DIAGNOSIS — G894 Chronic pain syndrome: Secondary | ICD-10-CM | POA: Diagnosis not present

## 2024-02-01 DIAGNOSIS — G8929 Other chronic pain: Secondary | ICD-10-CM | POA: Insufficient documentation

## 2024-02-01 DIAGNOSIS — M545 Low back pain, unspecified: Secondary | ICD-10-CM | POA: Diagnosis not present

## 2024-02-01 DIAGNOSIS — M797 Fibromyalgia: Secondary | ICD-10-CM | POA: Insufficient documentation

## 2024-02-01 MED ORDER — PREGABALIN 100 MG PO CAPS: 100.0000 mg | ORAL_CAPSULE | Freq: Three times a day (TID) | ORAL | 4 refills | Status: AC

## 2024-02-01 MED ORDER — HYDROCODONE-ACETAMINOPHEN 7.5-325 MG/15ML PO SOLN: 10.0000 mL | Freq: Three times a day (TID) | ORAL | 0 refills | Status: DC | PRN

## 2024-02-01 NOTE — Progress Notes (Signed)
 Subjective:    Patient ID: Chase [name], male    DOB: [birth year], 63 y.o.   MRN: [id number]  HPI Chase [name] is a 63 y.o. 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) (03/12/2018), Cerebrovascular accident, old (11/16/2014), Chest discomfort (06/08/2019), Chronic back pain (11/28/2020), Chronic cough (08/27/2014), Chronic left shoulder pain (08/06/2020), Chronic pain syndrome (11/23/2019), Chronic pansinusitis (06/30/2018), Chronic prostatitis (12/06/2013), 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), Constipation, 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), DDD (degenerative disc disease), cervical (04/26/2019), DDD (degenerative disc disease), lumbar (04/26/2019), Decreased hearing of both ears (02/12/2013), Deviated septum (05/31/2018), Diabetes mellitus without complication (HCC), Difficulty urinating, DM (diabetes mellitus) type II uncontrolled, periph vascular disorder (01/02/2016), Dry skin (05/09/2019), Dysuria (11/29/2010), Essential (primary) hypertension (01/26/2015), Excessive daytime sleepiness (02/11/2018), FATIGUE (12/09/2007), Fibromyalgia (03/02/2017), Food intolerance (05/09/2019), GERD (12/09/2007), GERD (gastroesophageal reflux  disease), GI bleed (02/10/2015), Headache(784.0), Hearing loss, 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/12/2013), Hemorrhoid (05/22/2011), HTN (hypertension) (02/04/2010), Hyperlipidemia, Hyperlipidemia associated with type 2 diabetes mellitus (HCC) (11/22/2020), Hyperlipidemia LDL goal <70 (01/26/2015), Hypersomnia, persistent (11/05/2012), Hypertension, Hypokalemia (08/29/2020), Hypotension (07/04/2020), 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), 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), Increasing residual urine (03/10/2022), Insect bite (03/16/2018), Insomnia (08/05/2011), Internal and external hemorrhoids without complication (10/04/2012), Internal hemorrhoids (10/22/2014), 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), Leg swelling, Localized swelling of both lower legs (03/02/2017), Low back pain (07/20/2007), Lower extremity edema (07/04/2020), Lower GI bleed, Melena (12/16/2021), Mixed conductive and sensorineural hearing loss of right ear with restricted hearing of left ear (10/16/2020), Nasal congestion, Neck pain (03/15/2020), NEPHROLITHIASIS (08/13/2006), Nocturia more than twice per night (02/11/2018), OSA on CPAP (10/17/2014), Otalgia of both ears (06/06/2021), Other chronic pain (03/02/2017), Other fatigue (07/04/2020), Pain in finger of right hand (10/06/2012), Pain in joint of right shoulder (02/21/2017), Palpitations (01/26/2015), Paresthesia (07/25/2010), Persistent atrial fibrillation (HCC) (08/29/2020), Persistent headaches (10/17/2014), 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), PONV (postoperative nausea and vomiting), Preoperative clearance (07/04/2020), Prolapsed hemorrhoids (10/05/2013), Radiculopathy, lumbar region (03/15/2020), Rash (05/24/2019), Rash and other nonspecific skin eruption (07/23/2022), Raynaud's disease (03/02/2017), Raynaud's phenomenon (03/02/2017), Rectal bleeding (02/12/2015), Rectal pain, Rectal ulcer with bleeding after hemorrhoid banding (02/11/2015), Rhinitis, chronic  (12/23/2006), Right shoulder pain (02/15/2015), Seasonal and perennial allergic rhinitis (05/09/2019), Severe episode of recurrent major depressive disorder, without psychotic features (HCC) (08/29/2020), Sinus infection (06/30/2014), Sinusitis, acute, maxillary (02/02/2014), Sleep apnea, Sleep apnea with use of continuous positive airway pressure (CPAP) (02/11/2018), SOB (shortness of breath) (07/04/2020), Stroke (HCC), Stroke (HCC), Subacute confusional state (10/17/2014), SYMPTOM, HYPERSOMNIA NOS (02/19/2007), Thrombosed hemorrhoids, Tick bite of back (11/23/2019), Tinnitus of both ears (08/29/2020), Trouble swallowing, Unspecified hemorrhoids (02/10/2015), Unspecified sleep apnea (11/05/2012), UTI (urinary tract infection) (05/19/2022), Varicose veins of lower extremities with other complications (08/10/2012), Vasomotor rhinitis (06/28/2021), and Vertigo (03/19/2012).   They are presenting to PM&R clinic as a new patient for pain management evaluation. They were referred by Dr. Antonio Meth for treatment of chronic pain.    [name] reports he has had chronic pain for about 10  years.  His speech is limited due to history of CVAs with aphasia.  Patient has neck pain with pain affecting his bilateral arms.  He also has lower back pain and pain in his legs.  He also has burning, pins-and-needles and tingling pain in his feet, suspected to be due to diabetes mellitus.  Walking and standing worsens his pain.  Pending backwards is also painful.  Patient indicates he has pain throughout his entire body.  He reports he was previously followed by Dr. Ernesto for his pain.  Patient reports he had injections in his back or neck and is unsure if this is beneficial.  He indicates he was later followed by other doctors that were prescribing him medications.  He has trouble swallowing pills so is taking liquid hydrocodone .  He says this medication is helping keep his pain under control.  He also takes gabapentin  100 mg 3 times  daily which she feels like is beneficial to the pain in his feet however he does not keep it controlled.   Chart review indicates he was previously diagnosed with fibromyalgia.   He reports history of Raynaud's phenomena   Patient is a poor historian and limitations due to aphasia         Red flag symptoms: No red flags for back pain endorsed in Hx or ROS   Medications tried:   Nsaids - limited by comorbidities  Tylenol  - helps Opiates  hydrocodone   Gabapentin - TID 100mg ,helping  TCAs  -denies, doesn't recall  SNRIs  - effexor  helps with mood      Other treatments: PT- helped in the past  Chiropractor TENs unit - Injections - back or neck injections not sure if this helped  Surgery- denies    Goals for pain control: Decrease overall body wide pain       Interval History 02/26/23 Mr. Akel is here for follow-up regarding his back and neck pain, pain related to peripheral neuropathy, fibromyalgia.  He reports that pain is largely unchanged from last visit.  He has not consistently tried taking gabapentin  200 mg 3 times daily and has mostly been taking it at the 100 mg dose.  He has been having more pain in his left great toe, being treated with antibiotics by his PCP.  He has been walking with physical therapy and feels like this has been helping his balance and strength.  Physical therapy has also had a little benefit to his pain.  He reports he forgot to get records sent over from his prior pain clinic.  He says he stopped going there because they started doing more procedure treatments.  He says they referred him to a different provider but he did not follow-up on this.  He reports his pain was better controlled when he was taking the hydrocodone  liquid.  He was able to walk a lot farther and do a lot more at home.   Interval History 03/26/23 Patient is here for follow-up regarding his chronic pain.  Pain is greatest in his neck, back, legs.  Patient does think that Lyrica  has  been beneficial, not having any side effects at this time.  He continues to be frustrated by his aphasia, limiting his ability to communicate.  Interval History 04/23/2023 Mr. Amend is here for follow-up regarding his chronic body wide pain.  Pain is the worst in his neck, lower back and legs.  Patient does feel that Lyrica  is reducing his pain, however not keeping it under good control.  Patient  had insurance issues with the pharmacy and was unable to pick up his hydrocodone  liquid, all of this appears to be resolved and pharmacy is ordering the medication for pickup.  Mood continues to be decreased and he feels depressed at times.  No SI or HI.  Interval History 06/25/23 Patient continues to have severe pain in his back and neck.  He also has milder pain in his arms and legs.  Patient reports benefit with Lyrica , feels like it is helping to reduce the pain in his legs. Patient reports liquid hydrocodone  liquid 10 mL providing mild benefit to his pain.patient. Reports it worked much better in the past when he used 15 ml dose.   Patient is difficult historian due to expressive aphasia.  Interval History 07/22/2022 Patient reports continued severe pain throughout his whole back.  The next worst area of pain as throughout his legs.  He also has severe pain in his neck and shoulders.  Hydrocodone  liquid is helping, he is using it sparingly when pain is only very severe.  He continues to use Lyrica  75 mg twice daily.  Reports he is tolerating both of these medications.Occasional constipation controlled with laxatives.  Ontinues to have pins-and-needles pain in his feet.   Interval History 09/04/22 Patient reports continued pain throughout his whole back, worse in the lumbar spine.  Reports also having pain in his hands, shoulders, feet.  Hydrocodone  is helping reduce the back pain and when it is severe.  He is also taking Lyrica  which is helping his overall body wide pain.  Patient reports he tried 1 or 2  sessions of aquatic therapy but stopped going-lack of motivation.  He is willing to try this again.  Communication more difficult due to chronic aphasia.  Interval History 10/19/23 Pt has continued pain in multiple area of his body but it is worse in his lower back. Also a lot of pain in his bilateral shoulders and R wrist.  Patient has not yet started physical therapy, reports he was busy recently.  Hydrocodone  liquid has been helping reduce his pain, often does not last long enough.  This medication has been helping him be more active at home and complete chores around the house.   Interval History 12/07/23 Patient reports he continues to have pain in his lower back that is very severe and limiting.  He has pain in other joints throughout his body but this is less intense.  He reports he did 1 treatment with aquatic therapy at [facility].  He says he did not feel very comfortable being at aquatic therapy and did not pursue further visits.  He continues to use hydrocodone  liquid and Lyrica  which help reduce his pain.  He tries to use hydrocodone  sparingly only when the pain is very severe.  He uses liquid hydrocodone  because he could not swallow the tablets previously, tablets are too big.  Reports he had follow-up with neurosurgery, no surgical intervention recommended.  Interval History 02/01/24  Reports generalized pain throughout body with lower back being one of the worst areas.  Pain rated as moderate intensity. Uses hydrocodone  sparingly, only when needed, still has some supply remaining from previous prescription. Sometimes takes dose  before yard work or riding lawnmower to avoid pain flare-ups.  - Lyrica  (currently twice daily) - reports helpful for pain, no significant side effects - Hydrocodone  - uses on occasion, no side effects noted - Miralax  - takes regularly for constipation management    Pain Inventory Average Pain 6 Pain Right  Now 7 My pain is sharp, burning, dull, stabbing,  tingling, and aching  In the last 24 hours, has pain interfered with the following? General activity 7 Relation with others 7 Enjoyment of life 7 What TIME of day is your pain at its worst? morning , daytime, evening, and night Sleep (in general) Fair  Pain is worse with: walking, bending, sitting, inactivity, standing, and some activites Pain improves with: Rest, heat, medication Relief from Meds: 6  Family History  Problem Relation Age of Onset   Breast cancer Mother 41       breast   Huntington's disease Mother    Stroke Father    Heart disease Father    Hypertension Father    Heart attack Father    Hypertension Brother    Hyperlipidemia Neg Hx    Diabetes Neg Hx    Social History   Socioeconomic History   Marital status: Single    Spouse name: Not on file   Number of children: 0   Years of education: Not on file   Highest education level: 12th grade  Occupational History   Not on file  Tobacco Use   Smoking status: Never   Smokeless tobacco: Never  Vaping Use   Vaping status: Never Used  Substance and Sexual Activity   Alcohol  use: No    Alcohol /week: 0.0 standard drinks of alcohol     Comment: none   Drug use: No   Sexual activity: Never  Other Topics Concern   Not on file  Social History Narrative   No caffeine intake except for chocolate.  Exercised-less walking    Social Drivers of Health   Financial Resource Strain: Patient Declined (11/25/2023)   Overall Financial Resource Strain (CARDIA)    Difficulty of Paying Living Expenses: Patient declined  Food Insecurity: Food Insecurity Present (11/25/2023)   Hunger Vital Sign    Worried About Running Out of Food in the Last Year: Sometimes true    Ran Out of Food in the Last Year: Patient declined  Transportation Needs: Patient Declined (02/17/2023)   PRAPARE - Administrator, Civil Service (Medical): Patient declined    Lack of Transportation (Non-Medical): Patient declined  Physical  Activity: Unknown (02/17/2023)   Exercise Vital Sign    Days of Exercise per Week: Patient declined    Minutes of Exercise per Session: Not on file  Stress: Stress Concern Present (11/25/2023)   [employer] of Occupational Health - Occupational Stress Questionnaire    Feeling of Stress : To some extent  Social Connections: Unknown (11/25/2023)   Social Connection and Isolation Panel    Frequency of Communication with Friends and Family: Patient declined    Frequency of Social Gatherings with Friends and Family: Patient declined    Attends Religious Services: Patient declined    Database administrator or Organizations: Patient declined    Attends Engineer, structural: Not on file    Marital Status: Patient declined   Past Surgical History:  Procedure Laterality Date   CATARACT EXTRACTION     x 3   EYE SURGERY  1968, 1985, 1987   cataracts   FLEXIBLE SIGMOIDOSCOPY N/A 02/11/2015   Procedure: FLEXIBLE SIGMOIDOSCOPY;  Surgeon: Lupita FORBES Commander, MD;  Location: [facility];  Service: Endoscopy;  Laterality: N/A;   INNER EAR SURGERY     rt ear   INNER EAR SURGERY  1992   surgery - right arm     1983   Past Surgical  History:  Procedure Laterality Date   CATARACT EXTRACTION     x 3   EYE SURGERY  1968, 1985, 1987   cataracts   FLEXIBLE SIGMOIDOSCOPY N/A 02/11/2015   Procedure: FLEXIBLE SIGMOIDOSCOPY;  Surgeon: Lupita FORBES Commander, MD;  Location: [facility];  Service: Endoscopy;  Laterality: N/A;   INNER EAR SURGERY     rt ear   INNER EAR SURGERY  1992   surgery - right arm     1983   Past Medical History:  Diagnosis Date   Abnormality of gait 11/10/2012   Acute GI bleeding    Acute renal insufficiency 02/10/2015   Acute upper respiratory infection 08/10/2012   Allergic drug reaction 05/24/2020   Allergic rhinitis 10/16/2020   Allergy    ANXIETY DEPRESSION 12/09/2007   Qualifier: Diagnosis of  By: Charlsie RN, Angeline MATSU    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) 03/12/2018   Cerebrovascular accident, old 11/16/2014   Chest discomfort 06/08/2019   Chronic back pain 11/28/2020   Chronic cough 08/27/2014   CXR 08/2014:  Mild hyperinflation, no acute process Spiro 09/2014:  Difficulty with procedure, but essentially normal +CVA's in past, with aspiration risk by history Speech evaluation 2016:      Chronic left shoulder pain 08/06/2020   Chronic pain syndrome 11/23/2019   Chronic pansinusitis 06/30/2018   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   Constipation    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   DDD (degenerative disc disease), cervical 04/26/2019   DDD (degenerative disc disease), lumbar 04/26/2019   Decreased hearing of both ears 02/12/2013   Deviated septum 05/31/2018   Diabetes mellitus without complication (HCC)    Difficulty urinating    DM (diabetes mellitus) type II uncontrolled, periph vascular disorder 01/02/2016   Dry skin 05/09/2019   Dysuria 11/29/2010   Essential (primary) hypertension 01/26/2015   Excessive daytime sleepiness 02/11/2018   FATIGUE 12/09/2007   Qualifier: Diagnosis of  By: Charlsie RN, Angeline MATSU    Fibromyalgia 03/02/2017   Food intolerance 05/09/2019   GERD 12/09/2007   Qualifier: Diagnosis of  By: Viviann Raddle MD, Marsha SAUNDERS    GERD (gastroesophageal reflux disease)    GI bleed 02/10/2015   Headache 12/23/2006   Qualifier: Diagnosis of   By: Charlsie OBIE Angeline MATSU     IMO SNOMED Dx Update Oct 2024     Hearing loss    Hemangioma 02/12/2013   Hemorrhoid 05/22/2011    HTN (hypertension) 02/04/2010   Qualifier: Diagnosis of  By: Domenica MD, Stacey     Hyperlipidemia    Hyperlipidemia associated with type 2 diabetes mellitus (HCC) 11/22/2020   Hyperlipidemia LDL goal <70 01/26/2015   Hypersomnia, persistent 11/05/2012   epworth of 18 points, was placed on CPAP in 2008 with very low AHI ( no number  Mentioned) and struggles ever since with PAP. 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   Leg swelling    Localized swelling of both lower legs 03/02/2017   Low back pain 07/20/2007   Qualifier: Diagnosis of  By: Viviann Raddle MD, Marsha SAUNDERS    Lower extremity edema 07/04/2020   Lower GI bleed    Melena 12/16/2021   Mixed conductive and sensorineural hearing loss of right ear with restricted hearing of left ear 10/16/2020   Nasal congestion    Neck pain 03/15/2020   NEPHROLITHIASIS 08/13/2006   Qualifier: Diagnosis of  By: Geralene Service     Nocturia more than twice per night 02/11/2018   OSA on CPAP 10/17/2014   Otalgia of both ears 06/06/2021   Other chronic pain 03/02/2017   Other fatigue 07/04/2020   Pain in finger of right hand 10/06/2012   Pain in joint of right shoulder 02/21/2017   Palpitations 01/26/2015   Paresthesia 07/25/2010   Persistent atrial fibrillation (HCC) 08/29/2020   Persistent headaches 10/17/2014   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   Qualifier: Diagnosis of  By: Viviann Raddle MD, Marsha R    PONV (postoperative nausea and vomiting)    Preoperative clearance 07/04/2020   Preventative health care 11/26/2011   Prolapsed hemorrhoids 10/05/2013   Radiculopathy, lumbar  region 03/15/2020   Rash 05/24/2019   Rash and other nonspecific skin eruption 07/23/2022   Raynaud's disease 03/02/2017   Raynaud's phenomenon 03/02/2017   Rectal bleeding 02/12/2015   Rectal pain    Rectal ulcer with bleeding after hemorrhoid banding 02/11/2015   Rhinitis, chronic 12/23/2006   Qualifier: Diagnosis of  By: Charlsie RN, Regina G    Right shoulder pain 02/15/2015   Seasonal and perennial allergic rhinitis 05/09/2019   Severe episode of recurrent major depressive disorder, 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   Sinus infection 06/30/2014   Sinusitis, acute, maxillary 02/02/2014   Sleep apnea    Sleep apnea with use of continuous positive airway pressure (CPAP) 02/11/2018   SOB (shortness of breath) 07/04/2020   Stroke (HCC)    Stroke (HCC)    Subacute confusional state 10/17/2014   SYMPTOM, HYPERSOMNIA NOS 02/19/2007   Qualifier: Diagnosis of  By: Winona Raring     Thrombosed hemorrhoids    Tick bite of back 11/23/2019   Tinnitus of both ears 08/29/2020   Trouble swallowing    Unspecified hemorrhoids 02/10/2015   UTI (urinary tract infection) 05/19/2022   Varicose veins of bilateral lower extremities with other complications 08/10/2012   IMO SNOMED Dx Update Oct 2024     Vasomotor rhinitis 06/28/2021   Vertigo 03/19/2012   BP 132/83   Pulse 77   Resp 16   Ht 5' 9.5 (1.765 m)   Wt 172 lb 3.2 oz (78.1 kg)   SpO2 96%   BMI 25.06 kg/m   Opioid Risk Score:   Fall Risk Score:  `1  Depression screen PHQ 2/9     02/01/2024    2:18 PM 12/07/2023    2:20 PM 10/19/2023    3:25 PM 09/04/2023    3:46 PM 07/23/2023    2:10 PM 06/25/2023    2:54 PM 04/23/2023    3:16 PM  Depression screen PHQ 2/9  Decreased Interest 1 2  2 1  2   Down, Depressed, Hopeless 1 2  1 1  2   PHQ - 2 Score 2 4  3 2  4   Altered sleeping 0  2 2  2    Tired, decreased energy 2  2 2  2    Change in appetite 1   1     Feeling bad or failure about yourself  2   2     Trouble concentrating  3   2     Moving slowly or fidgety/restless 2   3     Suicidal thoughts 0   0     PHQ-9 Score 12   15     Difficult doing work/chores Very difficult   Somewhat difficult         Review of Systems  Constitutional: Negative.   HENT:  Negative for voice change.   Eyes:  Positive for visual disturbance.  Respiratory: Negative.    Cardiovascular: Negative.   Gastrointestinal: Negative.   Endocrine: Negative.   Genitourinary: Negative.   Musculoskeletal:  Positive for back pain, gait problem and neck pain. Negative for neck stiffness.       Pain in both shoulders, Feet, legs, arms, back  Skin: Negative.   Allergic/Immunologic: Negative.   Neurological:  Positive for dizziness, weakness, light-headedness and headaches.       Weakness in both hands  Hematological: Negative.   Psychiatric/Behavioral:  Positive for dysphoric mood.   All other systems reviewed and are negative.      Objective:   Physical Exam  Gen: no distress, normal appearing HEENT: oral mucosa pink and moist, NCAT Chest: normal effort, normal rate of breathing Abd: soft, non-distended Ext: no edema Psych: Frustrated with his language deficits skin: Warm and dry Neuro: Alert and awake, follows commands, cranial nerves II through XII grossly intact Patient has significant aphasia, primarily expressive Sensory exam normal for light touch in all 4 limbs-intact to light touch in both feet throughout   Musculoskeletal:  Kyphotic posture Facet loading negative today Patient with mild diffuse tenderness to palpation throughout bilateral upper and lower extremities L-spine TTP Mild TTP C-spine Slump test negative bilaterally  Lumbar spine MRI 10/19/23 IEXAM: MRI LUMBAR SPINE WITHOUT CONTRAST   TECHNIQUE: Multiplanar, multisequence MR imaging of the lumbar spine was performed. No intravenous contrast was administered.   COMPARISON:  MRI of the lumbar spine dated 06/04/2021   FINDINGS: Segmentation:  Standard.   Alignment:  Physiologic lumbar alignment is maintained.   Vertebrae: Vertebral bodies demonstrate normal signal intensity. No compression fractures.   Conus medullaris and cauda equina: The conus medullaris terminates at the level of L1-L2. The distal spinal cord signal intensity is normal. Stable intradural lesion at the level of L2 measuring approximately 1 cm.   Paraspinal and other soft tissues: Right renal cysts. The visualized aorta is normal.   Disc levels:   L1-L2: Disc is normal in configuration. No facet arthropathy. No neuroforaminal stenosis. No spinal canal stenosis.   L2-L3: Disc is normal in configuration. Moderate bilateral facet arthropathy. No neuroforaminal stenosis. No spinal canal stenosis.   L3-L4: Disc bulge. Moderate bilateral facet arthropathy. Mild bilateral neuroforaminal stenosis. Mild spinal canal stenosis.   L4-L5: Disc bulge. Moderate bilateral facet arthropathy. Moderate bilateral neuroforaminal stenosis. Mild spinal canal stenosis.   L5-S1: Disc bulge. Moderate bilateral facet arthropathy. Mild bilateral neuroforaminal stenosis. No spinal canal stenosis.   IMPRESSION: 1. Mild canal stenoses at L3-L4 and L4-L5 secondary to disc bulging and facet arthropathy. 2. Moderate foraminal stenosis bilaterally at L4-L5. 3. 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  Denies SI or HI 5) History of CVA.  Patient continues to have aphasia   Plan 1) Dc  Gabapentin  previously, Increase Lyrica  to 100 mg three times daily.  He denies any side effects with current regimen 2) Previously on liquid hydrocodone , Continue Hycet 7.5/15ml, 10-15ml Q8H PRN.  Ordered. He has a hard time swallowing larger pills.  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   I called prior clinic and requested for records to be faxed over previously 5) Reconsult to [facility].  Also provided home exercises for him to complete 3 times a week 6) Continue venlafaxine   7) continue Flexeril  as needed 8) consider Qutenza if polyneuropathy pain does not improve with oral medications 9) MRI with Lumbar spondylosis multiple levels- consider MBB at later time as he may have component of facet mediated pain 10) continue monitor UDS, PDMP, pill counts-patient did not bring medication to clinic but has been almost  2 months 11) Referral to NSGY prior visit- reports no surgery or medications were planned/changed at the visit  12) Narcan  ordered prior visit

## 2024-02-05 ENCOUNTER — Encounter: Payer: Self-pay | Admitting: Internal Medicine

## 2024-02-05 ENCOUNTER — Ambulatory Visit (INDEPENDENT_AMBULATORY_CARE_PROVIDER_SITE_OTHER): Admitting: Internal Medicine

## 2024-02-05 VITALS — BP 126/78 | HR 80 | Ht 69.5 in | Wt 171.8 lb

## 2024-02-05 DIAGNOSIS — Z7984 Long term (current) use of oral hypoglycemic drugs: Secondary | ICD-10-CM

## 2024-02-05 DIAGNOSIS — Z7985 Long-term (current) use of injectable non-insulin antidiabetic drugs: Secondary | ICD-10-CM | POA: Diagnosis not present

## 2024-02-05 DIAGNOSIS — E785 Hyperlipidemia, unspecified: Secondary | ICD-10-CM | POA: Diagnosis not present

## 2024-02-05 DIAGNOSIS — E1165 Type 2 diabetes mellitus with hyperglycemia: Secondary | ICD-10-CM | POA: Diagnosis not present

## 2024-02-05 DIAGNOSIS — E1159 Type 2 diabetes mellitus with other circulatory complications: Secondary | ICD-10-CM

## 2024-02-05 LAB — POCT GLYCOSYLATED HEMOGLOBIN (HGB A1C): Hemoglobin A1C: 7 % — AB (ref 4.0–5.6)

## 2024-02-05 NOTE — Progress Notes (Signed)
 Patient ID: Chase [name], male   DOB: February 27, 1961, 63 y.o.   MRN: [id number]  HPI: Chase [name] is a 63 y.o.-year-old male, presenting for follow-up for DM2, dx in 2014, non-insulin -dependent, uncontrolled, with complications (PVD, cerebro-vascular ds - s/p ministrokes, PN). Last visit 4 months ago. He had [employer], now [place] only.  Interim history: He has occasional blurry vision, no increased urination. He continues to have memory loss and difficulty word finding. He continues to have chronic generalized pain and fibromyalgia. He has sinus congestion and sore throat which have been going on for approximately 2 months.  HbA1c levels reviewed: Lab Results  Component Value Date   HGBA1C 7.1 (A) 10/08/2023   HGBA1C 7.6 (A) 02/02/2023   HGBA1C 7.8 (A) 09/19/2022   HGBA1C 8.2 (A) 05/20/2022   HGBA1C 6.5 (A) 01/14/2022   HGBA1C 6.7 (A) 09/10/2021   HGBA1C 6.7 (A) 06/03/2021   HGBA1C 6.2 (A) 01/03/2021   HGBA1C 6.7 (A) 09/10/2020   HGBA1C 8.2 (H) 05/30/2020   HGBA1C 8.3 (H) 05/24/2020   HGBA1C 9.7 (H) 04/05/2020   HGBA1C 8.3 (H) 05/24/2019   HGBA1C 7.9 (A) 08/04/2018   HGBA1C 7.5 (A) 12/21/2017   HGBA1C 7.4 09/17/2017   HGBA1C 7.8 06/19/2017   HGBA1C 7.1 (H) 02/19/2017   HGBA1C 7.4 02/21/2016   HGBA1C 9.8 (H) 11/13/2015  06/05/2023: HbA1c 8.1%  Pt is on a regimen of:  - Metformin  500 mg 1x a day >> 2x >> 1x a day b/c problems with swallowing >> 1-2 >> 2x a day - Jardiance  25 mg at night >> before breakfast - Ozempic  0.5 >> 1 mg weekly In 11/2018, we tried to change from Januvia  to Ozempic  but this was not covered. Stopped Glipizide  b/c rash. Stopped Invokana  100 mg in am >> only got this for 1 mo He was on Metformin  500 mg po bid - in liquid form, as he could not swallow pills. He gets gi upset. He stopped this in 2014. He was previously on Januvia , then Victoza .  His fingerstick values are not accurate due to Raynaud's syndrome - now on a  CGM:     Prev.:   Lowest sugar was 80s >>  >100 >> 70s;  he has hypoglycemia awareness in the 70s. Highest sugar was 250 >> 200 >> 250s  Glucometer: [employer] Next >> AccuChek guide  In the past, he was occasional lemonade and ginger ale >> advised to stop - still drinking these but rarely.  -+ Mild CKD, last BUN/creatinine:  Lab Results  Component Value Date   BUN 13 11/27/2023   BUN 8 02/17/2023   CREATININE 0.93 11/27/2023   CREATININE 1.06 02/17/2023   Lab Results  Component Value Date   MICRALBCREAT 10 11/27/2023   MICRALBCREAT 9 06/05/2023   -+ HL.  last set of lipids: Lab Results  Component Value Date   CHOL 122 11/27/2023   HDL 35 (L) 11/27/2023   LDLCALC 67 11/27/2023   LDLDIRECT 59.0 12/22/2022   TRIG 112 11/27/2023   CHOLHDL 3.5 11/27/2023  On pravastatin  40.  - last eye exam was in 2025: No DR reportedly.  Dr. Madelyn.   - He has numbness and tingling in his feet, also pain.  Foot exam performed 03/04/2023 by Dr. Janit.  He had paronychia on exam at that time.  He does not have an appointment coming up with him.  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 Has varicose veins >> wears compresion hoses.He also has a h/o kidney stones.   On allopurinol  for gout. He has anxiety, PTSD from childhood.  Early in 2022, he was diagnosed with an intradural extramedullary spinal tumor.  It did not change in appearance on a recent MRI. He had prostatitis and UTIs. He is on Tamsulozin (changed from terazosin) and finasteride .  He was on nitrofurantoin.  ROS: + See HPI.  Past Medical History:  Diagnosis Date   Abnormality of gait 11/10/2012   Acute GI bleeding    Acute renal insufficiency 02/10/2015   Acute upper respiratory infection 08/10/2012   Allergic drug reaction 05/24/2020   Allergic rhinitis 10/16/2020   Allergy    ANXIETY DEPRESSION 12/09/2007   Qualifier: Diagnosis of  By: Charlsie RN,  Angeline MATSU    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) 03/12/2018   Cerebrovascular accident, old 11/16/2014   Chest discomfort 06/08/2019   Chronic back pain 11/28/2020   Chronic cough 08/27/2014   CXR 08/2014:  Mild hyperinflation, no acute process Spiro 09/2014:  Difficulty with procedure, but essentially normal +CVA's in past, with aspiration risk by history Speech evaluation 2016:      Chronic left shoulder pain 08/06/2020   Chronic pain syndrome 11/23/2019   Chronic pansinusitis 06/30/2018   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   Constipation    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   DDD (degenerative disc disease), cervical 04/26/2019   DDD (degenerative disc disease), lumbar 04/26/2019   Decreased hearing of both ears 02/12/2013   Deviated septum 05/31/2018   Diabetes mellitus without complication (HCC)    Difficulty urinating    DM (diabetes mellitus) type II uncontrolled, periph vascular disorder 01/02/2016   Dry skin 05/09/2019   Dysuria 11/29/2010   Essential (primary) hypertension 01/26/2015   Excessive daytime sleepiness 02/11/2018   FATIGUE 12/09/2007   Qualifier: Diagnosis of  By: Charlsie RN, Angeline MATSU    Fibromyalgia 03/02/2017   Food intolerance 05/09/2019   GERD 12/09/2007   Qualifier: Diagnosis of  By: Viviann Raddle MD, Marsha SAUNDERS    GERD (gastroesophageal reflux disease)    GI bleed 02/10/2015   Headache 12/23/2006   Qualifier: Diagnosis of   By: Charlsie OBIE Angeline MATSU     IMO SNOMED Dx Update Oct 2024     Hearing loss     Hemangioma 02/12/2013   Hemorrhoid 05/22/2011   HTN (hypertension) 02/04/2010   Qualifier: Diagnosis of  By: Domenica MD, Stacey     Hyperlipidemia    Hyperlipidemia associated with type 2 diabetes mellitus (HCC) 11/22/2020   Hyperlipidemia LDL goal <70 01/26/2015   Hypersomnia, persistent 11/05/2012   epworth of 18 points, was placed on CPAP in 2008 with very low AHI ( no number  Mentioned) and struggles ever since with PAP. 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   Leg swelling    Localized swelling of both lower legs 03/02/2017   Low back pain 07/20/2007   Qualifier: Diagnosis of  By: Viviann Raddle MD, Marsha SAUNDERS    Lower extremity edema 07/04/2020   Lower GI bleed    Melena 12/16/2021   Mixed conductive and sensorineural hearing loss of right ear with restricted hearing of left ear 10/16/2020   Nasal congestion    Neck pain 03/15/2020   NEPHROLITHIASIS 08/13/2006   Qualifier: Diagnosis of  By: Geralene Service     Nocturia more than twice per night 02/11/2018   OSA on CPAP 10/17/2014   Otalgia of both ears 06/06/2021   Other chronic pain 03/02/2017   Other fatigue 07/04/2020   Pain in finger of right hand 10/06/2012   Pain in joint of right shoulder 02/21/2017   Palpitations 01/26/2015   Paresthesia 07/25/2010   Persistent atrial fibrillation (HCC) 08/29/2020   Persistent headaches 10/17/2014   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   Qualifier: Diagnosis of  By: Viviann Raddle MD, Marsha R    PONV (postoperative nausea and vomiting)    Preoperative clearance 07/04/2020   Preventative health care 11/26/2011   Prolapsed  hemorrhoids 10/05/2013   Radiculopathy, lumbar region 03/15/2020   Rash 05/24/2019   Rash and other nonspecific skin eruption 07/23/2022   Raynaud's disease 03/02/2017   Raynaud's phenomenon 03/02/2017   Rectal bleeding 02/12/2015   Rectal pain    Rectal ulcer with bleeding after hemorrhoid banding 02/11/2015   Rhinitis, chronic 12/23/2006   Qualifier: Diagnosis of  By: Charlsie RN, Regina G    Right shoulder pain 02/15/2015   Seasonal and perennial allergic rhinitis 05/09/2019   Severe episode of recurrent major depressive disorder, 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   Sinus infection 06/30/2014   Sinusitis, acute, maxillary 02/02/2014   Sleep apnea    Sleep apnea with use of continuous positive airway pressure (CPAP) 02/11/2018   SOB (shortness of breath) 07/04/2020   Stroke (HCC)    Stroke (HCC)    Subacute confusional state 10/17/2014   SYMPTOM, HYPERSOMNIA NOS 02/19/2007   Qualifier: Diagnosis of  By: Winona Raring     Thrombosed hemorrhoids    Tick bite of back 11/23/2019   Tinnitus of both ears 08/29/2020   Trouble swallowing    Unspecified hemorrhoids 02/10/2015   UTI (urinary tract infection) 05/19/2022   Varicose veins of bilateral lower extremities with other complications 08/10/2012   IMO SNOMED Dx Update Oct 2024     Vasomotor rhinitis 06/28/2021   Vertigo 03/19/2012   Past Surgical History:  Procedure Laterality Date   CATARACT EXTRACTION     x 3   EYE SURGERY  1968, 1985, 1987   cataracts   FLEXIBLE SIGMOIDOSCOPY N/A 02/11/2015   Procedure: FLEXIBLE SIGMOIDOSCOPY;  Surgeon: Lupita FORBES Commander, MD;  Location: [facility];  Service: Endoscopy;  Laterality: N/A;   INNER EAR SURGERY     rt ear   INNER EAR SURGERY  1992   surgery - right arm     1983   Social History   Socioeconomic History   Marital status: Single    Spouse name: Not on file   Number of children: 0   Years of education: Not on file   Highest education level:  12th grade   Occupational History   Not on file  Tobacco Use   Smoking status: Never   Smokeless tobacco: Never  Vaping Use   Vaping status: Never Used  Substance and Sexual Activity   Alcohol  use: No    Alcohol /week: 0.0 standard drinks of alcohol     Comment: none   Drug use: No   Sexual activity: Never  Other Topics Concern   Not on file  Social History Narrative   No caffeine intake except for chocolate.  Exercised-less walking    Social Drivers of Health   Financial Resource Strain: Patient Declined (11/25/2023)   Overall Financial Resource Strain (CARDIA)    Difficulty of Paying Living Expenses: Patient declined  Food Insecurity: Food Insecurity Present (11/25/2023)   Hunger Vital Sign    Worried About Running Out of Food in the Last Year: Sometimes true    Ran Out of Food in the Last Year: Patient declined  Transportation Needs: Patient Declined (02/17/2023)   PRAPARE - Administrator, Civil Service (Medical): Patient declined    Lack of Transportation (Non-Medical): Patient declined  Physical Activity: Unknown (02/17/2023)   Exercise Vital Sign    Days of Exercise per Week: Patient declined    Minutes of Exercise per Session: Not on file  Stress: Stress Concern Present (11/25/2023)   [employer] of Occupational Health - Occupational Stress Questionnaire    Feeling of Stress : To some extent  Social Connections: Unknown (11/25/2023)   Social Connection and Isolation Panel    Frequency of Communication with Friends and Family: Patient declined    Frequency of Social Gatherings with Friends and Family: Patient declined    Attends Religious Services: Patient declined    Database administrator or Organizations: Patient declined    Attends Banker Meetings: Not on file    Marital Status: Patient declined  Intimate Partner Violence: Unknown (01/17/2022)   Received from [facility]   HITS    Physically Hurt: Not on file    Insult or Talk Down To: Not on  file    Threaten Physical Harm: Not on file    Scream or Curse: Not on file   Current Outpatient Medications on File Prior to Visit  Medication Sig Dispense Refill   Accu-Chek Softclix Lancets lancets Use as instructed 1x a day 100 each PRN   allopurinol  (ZYLOPRIM ) 100 MG tablet TAKE 1 TABLET BY MOUTH TWICE A DAY 60 tablet 2   amLODipine  (NORVASC ) 2.5 MG tablet TAKE 1 TABLET (2.5 MG TOTAL) BY MOUTH DAILY. MUST KEEP APRIL'S APPT FOR ADDITIONAL REFILLS 90 tablet 1   Azelastine  HCl (ASTEPRO ) 0.15 % SOLN Place 1 spray into the nose in the morning and at bedtime. 1 mL 3   Azelastine  HCl 137 MCG/SPRAY SOLN PLACE 1 SPRAY INTO BOTH NOSTRILS 2 (TWO) TIMES DAILY. USE IN EACH NOSTRIL AS DIRECTED 30 mL 6   BD PEN NEEDLE NANO 2ND GEN 32G X 4 MM MISC USE ONCE DAILY AS DIRECTED 100 each 3   clotrimazole -betamethasone  (LOTRISONE ) cream Apply 1 application  topically as needed for rash.     Continuous Glucose Sensor (FREESTYLE LIBRE 3 PLUS SENSOR) MISC Inject 1 Device into the skin continuous. Change every 15 days 6 each 3   cyclobenzaprine  (FLEXERIL ) 10 MG tablet TAKE 1 TABLET BY MOUTH TWICE A DAY AS NEEDED FOR MUSCLE SPASM 42 tablet 2   doxycycline  (VIBRA -TABS) 100 MG tablet Take 1 tablet (100 mg total) by mouth  2 (two) times daily. 20 tablet 0   empagliflozin  (JARDIANCE ) 25 MG TABS tablet TAKE 1 TABLET (25 MG TOTAL) BY MOUTH DAILY. 90 tablet 2   empagliflozin  (JARDIANCE ) 25 MG TABS tablet Take 1 tablet (25 mg total) by mouth daily before breakfast.     EPINEPHrine  0.3 mg/0.3 mL IJ SOAJ injection Inject 0.3 mg into the muscle as needed for anaphylaxis. 2 each 0   famotidine  (PEPCID ) 40 MG/5ML suspension TAKE 2.5 MLS (20 MG TOTAL) BY MOUTH DAILY. DISREGARD AFTER 30 DAYS 100 mL 2   FIBER SELECT GUMMIES PO Take 1 tablet by mouth daily.     finasteride  (PROSCAR ) 5 MG tablet Take 5 mg by mouth daily.     fluticasone  (CUTIVATE ) 0.05 % cream Apply 1 application topically 2 (two) times daily.     glucose blood  (ACCU-CHEK GUIDE) test strip Use as instructed 1x a day - 100 each PRN   HYDROcodone -acetaminophen  (HYCET) 7.5-325 mg/15 ml solution Take 10-15 mLs by mouth every 8 (eight) hours as needed for moderate pain (pain score 4-6). 1200 mL 0   hydrocortisone  (ANUSOL -HC) 2.5 % rectal cream PLACE 1 APPLICATION RECTALLY 2 (TWO) TIMES DAILY. 30 g 2   hydrOXYzine  (ATARAX ) 25 MG tablet TAKE 1 TABLET (25 MG TOTAL) BY MOUTH EVERY 8 (EIGHT) HOURS AS NEEDED FOR ANXIETY OR ITCHING. 90 tablet 1   loratadine  (CLARITIN ) 10 MG tablet Take 1 tablet (10 mg total) by mouth daily. 30 tablet 0   losartan  (COZAAR ) 100 MG tablet TAKE 1 TABLET BY MOUTH EVERY DAY 90 tablet 1   metFORMIN  (GLUCOPHAGE ) 500 MG tablet TAKE 1 TABLET BY MOUTH 2 TIMES DAILY WITH A MEAL. 180 tablet 1   metoprolol  tartrate (LOPRESSOR ) 50 MG tablet TAKE 1 TABLET BY MOUTH EVERY DAY 90 tablet 1   naloxone  (NARCAN ) nasal spray 4 mg/0.1 mL In case of opioid overdose 2 each 1   polyethylene glycol powder (GAVILAX) 17 GM/SCOOP powder Take 17 g by mouth daily as needed for moderate constipation. 238 g 5   pravastatin  (PRAVACHOL ) 20 MG tablet Take 2 tablets (40 mg total) by mouth daily. 180 tablet 0   pregabalin  (LYRICA ) 100 MG capsule Take 1 capsule (100 mg total) by mouth 3 (three) times daily. 90 capsule 4   promethazine -dextromethorphan (PROMETHAZINE -DM) 6.25-15 MG/5ML syrup Take 5 mLs by mouth 4 (four) times daily as needed. 118 mL 0   promethazine -dextromethorphan (PROMETHAZINE -DM) 6.25-15 MG/5ML syrup Take 5 mLs by mouth 4 (four) times daily as needed. 118 mL 0   Semaglutide , 1 MG/DOSE, (OZEMPIC , 1 MG/DOSE,) 4 MG/3ML SOPN Inject 1 mg into the skin once a week. 9 mL 3   tamsulosin  (FLOMAX ) 0.4 MG CAPS capsule Take 0.4 mg by mouth daily.     triamcinolone  (NASACORT ) 55 MCG/ACT AERO nasal inhaler Place 2 sprays into the nose daily. 1 each 12   triamcinolone  (NASACORT ) 55 MCG/ACT AERO nasal inhaler Place 2 sprays into the nose daily. 1 each 12   venlafaxine  XR  (EFFEXOR -XR) 75 MG 24 hr capsule TAKE 1 CAPSULE BY MOUTH DAILY WITH BREAKFAST. 90 capsule 1   No current facility-administered medications on file prior to visit.   Allergies  Allergen Reactions   Amoxicillin Hives, Itching and Other (See Comments)    White tongue; ? hives   Sulfa  Antibiotics Hives and Rash   Terfenadine     ? reaction   Family History  Problem Relation Age of Onset   Breast cancer Mother 63  breast   Huntington's disease Mother    Stroke Father    Heart disease Father    Hypertension Father    Heart attack Father    Hypertension Brother    Hyperlipidemia Neg Hx    Diabetes Neg Hx    PE: BP 126/78   Pulse 80   Ht 5' 9.5 (1.765 m)   Wt 171 lb 12.8 oz (77.9 kg)   SpO2 99%   BMI 25.01 kg/m  Wt Readings from Last 10 Encounters:  02/05/24 171 lb 12.8 oz (77.9 kg)  02/01/24 172 lb 3.2 oz (78.1 kg)  12/07/23 170 lb 6.4 oz (77.3 kg)  11/27/23 170 lb 6.4 oz (77.3 kg)  10/19/23 174 lb 6.4 oz (79.1 kg)  10/13/23 171 lb 1.9 oz (77.6 kg)  10/08/23 169 lb 12.8 oz (77 kg)  09/04/23 173 lb 9.6 oz (78.7 kg)  07/23/23 174 lb (78.9 kg)  06/25/23 179 lb (81.2 kg)   Constitutional: Slightly overweight, in NAD Eyes: EOMI, no exophthalmos ENT: no thyromegaly, no cervical lymphadenopathy Cardiovascular: RRR, No MRG, + mild swelling and pain on palpation in B LE  -wears compression hoses Respiratory: CTA B Musculoskeletal: no deformities Skin: No rashes Neurological: no tremor with outstretched hands Diabetic Foot Exam - Simple   Simple Foot Form Diabetic Foot exam was performed with the following findings: Yes 02/05/2024  1:27 PM  Visual Inspection No deformities, no ulcerations, no other skin breakdown bilaterally: Yes Sensation Testing Intact to touch and monofilament testing bilaterally: Yes Pulse Check Posterior Tibialis and Dorsalis pulse intact bilaterally: Yes Comments + dry skin + Onychodystrophy     ASSESSMENT: 1. DM2, non-insulin -dependent,  uncontrolled, with complications - PVD - cerebro-vascular ds - s/p ministrokes - PN  2. HL  PLAN:  1. Patient with history of uncontrolled type 2 diabetes, on metformin , SGLT2 inhibitor and weekly GLP-1 receptor agonist, with good control whenever he has access to the above regimen.  We had many problems with him not being able to get his medicines approved in the past and blood sugar control fluctuated.  However, on this regimen, latest HbA1c was lower, at 7.1%. - At last visit, he was also on the freestyle libre CGM and sugars appeared to be significantly improved, after increasing the Ozempic  dose.  He had occasional mild hyperglycemic spikes but we did not change his regimen at that time. GM interpretation: -At today's visit, we reviewed his CGM downloads: It appears that 63% of values are in target range (goal >70%), while 37% are higher than 180 (goal <25%), and 0% are lower than 70 (goal <4%).  The calculated average blood sugar is 173.  The projected HbA1c for the next 3 months (GMI) is 7.4%. -Reviewing the CGM trends, sugars are actually worse in the last month compared to the previous period since last visit, when they were mostly at goal.  Upon questioning, he has a sinus infection and he is also having some snacks that we reviewed together.  We discussed about which ones to cut out and why.  I gave him examples of healthier snacks.  He is interested in a referral to nutrition.  I placed this referral today.  For now, will not change his regimen but we may need to do so at next visit if the sugars do not improve. - I suggested to:  Patient Instructions  Please continue: - Metformin  500 mg 1-2x a day - Jardiance  25 mg before b'fast - Ozempic  1 mg weekly  Please return  in 4 months.  - we checked his HbA1c: 7.0% (lower) - advised to check sugars at different times of the day - 4x a day, rotating check times - advised for yearly eye exams >> he is UTD - return to clinic in 4  months  2. HL - Latest lipid panel showed a slightly low HDL and an LDL above our target of less than 55: Lab Results  Component Value Date   CHOL 122 11/27/2023   HDL 35 (L) 11/27/2023   LDLCALC 67 11/27/2023   LDLDIRECT 59.0 12/22/2022   TRIG 112 11/27/2023   CHOLHDL 3.5 11/27/2023  -He continues pravastatin  40 mg daily without side effects  Lela Fendt, MD PhD [facility]

## 2024-02-05 NOTE — Patient Instructions (Addendum)
 Please continue: - Metformin  500 mg 2x a day - Jardiance  25 mg before b'fast - Ozempic  1 mg weekly  Please return in 3-4 months.

## 2024-02-08 ENCOUNTER — Ambulatory Visit: Admitting: Family Medicine

## 2024-02-08 ENCOUNTER — Encounter: Payer: Self-pay | Admitting: Family Medicine

## 2024-02-08 VITALS — BP 112/80 | HR 82 | Temp 98.0°F | Resp 18 | Ht 69.5 in | Wt 169.4 lb

## 2024-02-08 DIAGNOSIS — J0140 Acute pansinusitis, unspecified: Secondary | ICD-10-CM

## 2024-02-08 DIAGNOSIS — R21 Rash and other nonspecific skin eruption: Secondary | ICD-10-CM

## 2024-02-08 MED ORDER — CLOTRIMAZOLE-BETAMETHASONE 1-0.05 % EX CREA
1.0000 | TOPICAL_CREAM | Freq: Every day | CUTANEOUS | 0 refills | Status: AC
Start: 2024-02-08 — End: ?

## 2024-02-08 MED ORDER — PROMETHAZINE-DM 6.25-15 MG/5ML PO SYRP
5.0000 mL | ORAL_SOLUTION | Freq: Four times a day (QID) | ORAL | 0 refills | Status: AC | PRN
Start: 2024-02-08 — End: ?

## 2024-02-08 MED ORDER — DOXYCYCLINE HYCLATE 100 MG PO TABS
100.0000 mg | ORAL_TABLET | Freq: Two times a day (BID) | ORAL | 0 refills | Status: DC
Start: 2024-02-08 — End: 2024-04-12

## 2024-02-08 NOTE — Patient Instructions (Signed)

## 2024-02-08 NOTE — Progress Notes (Signed)
 Subjective:    Patient ID: Chase [name], male    DOB: 1960/08/07, 63 y.o.   MRN: [id number]  Chief Complaint  Patient presents with   Sinus Problem    Pt states having sinus pressure and pain, congestion, Occ cough. Pt states taking OTC Tussin. Sxs started several months ago.     HPI Patient is in today for sinusitis.  Discussed the use of AI scribe software for clinical note transcription with the patient, who gave verbal consent to proceed.  Discussed the use of AI scribe software for clinical note transcription with the patient, who gave verbal consent to proceed.  Pt here today c/o sinus problems for several months.  No fever, taking his allergy medication.  ---- he also c/o rash on back of his neck.  Not itchy. No new products used.    Past Medical History:  Diagnosis Date   Abnormality of gait 11/10/2012   Acute GI bleeding    Acute renal insufficiency 02/10/2015   Acute upper respiratory infection 08/10/2012   Allergic drug reaction 05/24/2020   Allergic rhinitis 10/16/2020   Allergy    ANXIETY DEPRESSION 12/09/2007   Qualifier: Diagnosis of  By: Charlsie RN, Angeline MATSU    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) 03/12/2018   Cerebrovascular accident, old 11/16/2014   Chest discomfort 06/08/2019   Chronic back pain 11/28/2020   Chronic cough 08/27/2014   CXR 08/2014:  Mild hyperinflation, no acute process Spiro 09/2014:  Difficulty with procedure, but essentially normal +CVA's in past, with aspiration risk by history Speech evaluation 2016:      Chronic  left shoulder pain 08/06/2020   Chronic pain syndrome 11/23/2019   Chronic pansinusitis 06/30/2018   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   Constipation    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   DDD (degenerative disc disease), cervical 04/26/2019   DDD (degenerative disc disease), lumbar 04/26/2019   Decreased hearing of both ears 02/12/2013   Deviated septum 05/31/2018   Diabetes mellitus without complication (HCC)    Difficulty urinating    DM (diabetes mellitus) type II uncontrolled, periph vascular disorder 01/02/2016   Dry skin 05/09/2019   Dysuria 11/29/2010   Essential (primary) hypertension 01/26/2015   Excessive daytime sleepiness 02/11/2018   FATIGUE 12/09/2007   Qualifier: Diagnosis of  By: Charlsie OBIE Angeline MATSU    Fibromyalgia 03/02/2017   Food intolerance 05/09/2019   GERD 12/09/2007   Qualifier: Diagnosis of  By: Viviann Raddle MD, Marsha SAUNDERS    GERD (gastroesophageal reflux disease)    GI bleed 02/10/2015   Headache 12/23/2006   Qualifier: Diagnosis of   By: Charlsie OBIE Angeline MATSU     IMO SNOMED Dx Update Oct 2024     Hearing loss    Hemangioma 02/12/2013   Hemorrhoid 05/22/2011   HTN (hypertension) 02/04/2010   Qualifier: Diagnosis of  By: Domenica MD, Stacey     Hyperlipidemia    Hyperlipidemia associated with type 2 diabetes mellitus (HCC) 11/22/2020  Hyperlipidemia LDL goal <70 01/26/2015   Hypersomnia, persistent 11/05/2012   epworth of 18 points, was placed on CPAP in 2008 with very low AHI ( no number  Mentioned) and struggles ever since with PAP. 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   Leg swelling    Localized swelling of both lower legs 03/02/2017   Low back pain 07/20/2007   Qualifier: Diagnosis of  By: Viviann Raddle MD, Marsha SAUNDERS    Lower extremity edema 07/04/2020   Lower GI bleed    Melena 12/16/2021   Mixed conductive and sensorineural hearing loss of right ear with restricted hearing of left ear 10/16/2020   Nasal congestion    Neck pain 03/15/2020   NEPHROLITHIASIS 08/13/2006   Qualifier: Diagnosis of  By: Geralene Service     Nocturia more than twice per night 02/11/2018   OSA on CPAP 10/17/2014   Otalgia of both ears 06/06/2021   Other chronic pain 03/02/2017   Other fatigue 07/04/2020   Pain in finger of right hand 10/06/2012   Pain in joint of right shoulder 02/21/2017   Palpitations 01/26/2015   Paresthesia 07/25/2010   Persistent atrial fibrillation (HCC) 08/29/2020   Persistent headaches 10/17/2014   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   Qualifier: Diagnosis of  By: Viviann Raddle MD, Marsha R    PONV (postoperative nausea and vomiting)    Preoperative clearance 07/04/2020   Preventative health care 11/26/2011   Prolapsed hemorrhoids 10/05/2013   Radiculopathy, lumbar region 03/15/2020   Rash 05/24/2019   Rash and other nonspecific skin eruption 07/23/2022   Raynaud's disease 03/02/2017   Raynaud's phenomenon 03/02/2017   Rectal bleeding 02/12/2015   Rectal pain    Rectal ulcer with bleeding after hemorrhoid banding 02/11/2015   Rhinitis, chronic 12/23/2006   Qualifier: Diagnosis of  By: Charlsie RN, Regina G    Right shoulder pain 02/15/2015   Seasonal and perennial allergic rhinitis 05/09/2019   Severe episode of recurrent major depressive disorder, 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   Sinus infection 06/30/2014   Sinusitis, acute, maxillary 02/02/2014   Sleep apnea    Sleep apnea with use of continuous positive airway pressure (CPAP) 02/11/2018   SOB (shortness of breath) 07/04/2020   Stroke (HCC)    Stroke  (HCC)    Subacute confusional state 10/17/2014   SYMPTOM, HYPERSOMNIA NOS 02/19/2007   Qualifier: Diagnosis of  By: Winona Raring     Thrombosed hemorrhoids    Tick bite of back 11/23/2019   Tinnitus of both ears 08/29/2020   Trouble swallowing    Unspecified hemorrhoids 02/10/2015   UTI (urinary tract infection) 05/19/2022   Varicose veins of bilateral lower extremities with other complications 08/10/2012   IMO SNOMED Dx Update Oct 2024     Vasomotor rhinitis 06/28/2021   Vertigo 03/19/2012    Past Surgical History:  Procedure Laterality Date   CATARACT EXTRACTION     x 3   EYE SURGERY  1968, 1985, 1987   cataracts   FLEXIBLE SIGMOIDOSCOPY N/A 02/11/2015   Procedure: FLEXIBLE SIGMOIDOSCOPY;  Surgeon: Lupita FORBES Commander, MD;  Location: [facility];  Service: Endoscopy;  Laterality: N/A;  INNER EAR SURGERY     rt ear   INNER EAR SURGERY  1992   surgery - right arm     1983    Family History  Problem Relation Age of Onset   Breast cancer Mother 27       breast   Huntington's disease Mother    Stroke Father    Heart disease Father    Hypertension Father    Heart attack Father    Hypertension Brother    Hyperlipidemia Neg Hx    Diabetes Neg Hx     Social History   Socioeconomic History   Marital status: Single    Spouse name: Not on file   Number of children: 0   Years of education: Not on file   Highest education level: 12th grade  Occupational History   Not on file  Tobacco Use   Smoking status: Never   Smokeless tobacco: Never  Vaping Use   Vaping status: Never Used  Substance and Sexual Activity   Alcohol  use: No    Alcohol /week: 0.0 standard drinks of alcohol     Comment: none   Drug use: No   Sexual activity: Never  Other Topics Concern   Not on file  Social History Narrative   No caffeine intake except for chocolate.  Exercised-less walking    Social Drivers of Health   Financial Resource Strain: Patient Declined (11/25/2023)   Overall  Financial Resource Strain (CARDIA)    Difficulty of Paying Living Expenses: Patient declined  Food Insecurity: Food Insecurity Present (11/25/2023)   Hunger Vital Sign    Worried About Running Out of Food in the Last Year: Sometimes true    Ran Out of Food in the Last Year: Patient declined  Transportation Needs: Unmet Transportation Needs (02/07/2024)   PRAPARE - Administrator, Civil Service (Medical): Yes    Lack of Transportation (Non-Medical): Not on file  Physical Activity: Unknown (02/07/2024)   Exercise Vital Sign    Days of Exercise per Week: 1 day    Minutes of Exercise per Session: Not on file  Stress: Stress Concern Present (02/07/2024)   [employer] of Occupational Health - Occupational Stress Questionnaire    Feeling of Stress: To some extent  Social Connections: Unknown (02/07/2024)   Social Connection and Isolation Panel    Frequency of Communication with Friends and Family: Not on file    Frequency of Social Gatherings with Friends and Family: Not on file    Attends Religious Services: Not on file    Active Member of Clubs or Organizations: No    Attends Banker Meetings: Not on file    Marital Status: Not on file  Intimate Partner Violence: Unknown (01/17/2022)   Received from [facility]   HITS    Physically Hurt: Not on file    Insult or Talk Down To: Not on file    Threaten Physical Harm: Not on file    Scream or Curse: Not on file    Outpatient Medications Prior to Visit  Medication Sig Dispense Refill   Accu-Chek Softclix Lancets lancets Use as instructed 1x a day 100 each PRN   allopurinol  (ZYLOPRIM ) 100 MG tablet TAKE 1 TABLET BY MOUTH TWICE A DAY 60 tablet 2   amLODipine  (NORVASC ) 2.5 MG tablet TAKE 1 TABLET (2.5 MG TOTAL) BY MOUTH DAILY. MUST KEEP APRIL'S APPT FOR ADDITIONAL REFILLS 90 tablet 1   Azelastine  HCl (ASTEPRO ) 0.15 % SOLN Place 1 spray into  the nose in the morning and at bedtime. 1 mL 3   Azelastine  HCl 137  MCG/SPRAY SOLN PLACE 1 SPRAY INTO BOTH NOSTRILS 2 (TWO) TIMES DAILY. USE IN EACH NOSTRIL AS DIRECTED 30 mL 6   BD PEN NEEDLE NANO 2ND GEN 32G X 4 MM MISC USE ONCE DAILY AS DIRECTED 100 each 3   Continuous Glucose Sensor (FREESTYLE LIBRE 3 PLUS SENSOR) MISC Inject 1 Device into the skin continuous. Change every 15 days 6 each 3   cyclobenzaprine  (FLEXERIL ) 10 MG tablet TAKE 1 TABLET BY MOUTH TWICE A DAY AS NEEDED FOR MUSCLE SPASM 42 tablet 2   empagliflozin  (JARDIANCE ) 25 MG TABS tablet Take 1 tablet (25 mg total) by mouth daily before breakfast.     EPINEPHrine  0.3 mg/0.3 mL IJ SOAJ injection Inject 0.3 mg into the muscle as needed for anaphylaxis. 2 each 0   famotidine  (PEPCID ) 40 MG/5ML suspension TAKE 2.5 MLS (20 MG TOTAL) BY MOUTH DAILY. DISREGARD AFTER 30 DAYS 100 mL 2   FIBER SELECT GUMMIES PO Take 1 tablet by mouth daily.     finasteride  (PROSCAR ) 5 MG tablet Take 5 mg by mouth daily.     glucose blood (ACCU-CHEK GUIDE) test strip Use as instructed 1x a day - 100 each PRN   HYDROcodone -acetaminophen  (HYCET) 7.5-325 mg/15 ml solution Take 10-15 mLs by mouth every 8 (eight) hours as needed for moderate pain (pain score 4-6). 1200 mL 0   hydrocortisone  (ANUSOL -HC) 2.5 % rectal cream PLACE 1 APPLICATION RECTALLY 2 (TWO) TIMES DAILY. 30 g 2   hydrOXYzine  (ATARAX ) 25 MG tablet TAKE 1 TABLET (25 MG TOTAL) BY MOUTH EVERY 8 (EIGHT) HOURS AS NEEDED FOR ANXIETY OR ITCHING. 90 tablet 1   loratadine  (CLARITIN ) 10 MG tablet Take 1 tablet (10 mg total) by mouth daily. 30 tablet 0   losartan  (COZAAR ) 100 MG tablet TAKE 1 TABLET BY MOUTH EVERY DAY 90 tablet 1   metFORMIN  (GLUCOPHAGE ) 500 MG tablet TAKE 1 TABLET BY MOUTH 2 TIMES DAILY WITH A MEAL. 180 tablet 1   metoprolol  tartrate (LOPRESSOR ) 50 MG tablet TAKE 1 TABLET BY MOUTH EVERY DAY 90 tablet 1   naloxone  (NARCAN ) nasal spray 4 mg/0.1 mL In case of opioid overdose 2 each 1   polyethylene glycol powder (GAVILAX) 17 GM/SCOOP powder Take 17 g by mouth  daily as needed for moderate constipation. 238 g 5   pravastatin  (PRAVACHOL ) 20 MG tablet Take 2 tablets (40 mg total) by mouth daily. 180 tablet 0   pregabalin  (LYRICA ) 100 MG capsule Take 1 capsule (100 mg total) by mouth 3 (three) times daily. 90 capsule 4   Semaglutide , 1 MG/DOSE, (OZEMPIC , 1 MG/DOSE,) 4 MG/3ML SOPN Inject 1 mg into the skin once a week. 9 mL 3   tamsulosin  (FLOMAX ) 0.4 MG CAPS capsule Take 0.4 mg by mouth daily.     triamcinolone  (NASACORT ) 55 MCG/ACT AERO nasal inhaler Place 2 sprays into the nose daily. 1 each 12   venlafaxine  XR (EFFEXOR -XR) 75 MG 24 hr capsule TAKE 1 CAPSULE BY MOUTH DAILY WITH BREAKFAST. 90 capsule 1   promethazine -dextromethorphan (PROMETHAZINE -DM) 6.25-15 MG/5ML syrup Take 5 mLs by mouth 4 (four) times daily as needed. 118 mL 0   clotrimazole -betamethasone  (LOTRISONE ) cream Apply 1 application  topically as needed for rash. (Patient not taking: Reported on 02/08/2024)     doxycycline  (VIBRA -TABS) 100 MG tablet Take 1 tablet (100 mg total) by mouth 2 (two) times daily. (Patient not taking: Reported on 02/05/2024) 20  tablet 0   fluticasone  (CUTIVATE ) 0.05 % cream Apply 1 application topically 2 (two) times daily. (Patient not taking: Reported on 02/05/2024)     No facility-administered medications prior to visit.    Allergies  Allergen Reactions   Amoxicillin Hives, Itching and Other (See Comments)    White tongue; ? hives   Sulfa  Antibiotics Hives and Rash   Terfenadine     ? reaction    Review of Systems  Constitutional:  Negative for chills, fever and malaise/fatigue.  HENT:  Positive for congestion, sinus pain and sore throat. Negative for hearing loss.   Eyes:  Negative for discharge.  Respiratory:  Positive for cough. Negative for sputum production, shortness of breath and stridor.   Cardiovascular:  Negative for chest pain, palpitations and leg swelling.  Gastrointestinal:  Negative for abdominal pain, blood in stool, constipation,  diarrhea, heartburn, nausea and vomiting.  Genitourinary:  Negative for dysuria, frequency, hematuria and urgency.  Musculoskeletal:  Negative for back pain, falls and myalgias.  Skin:  Negative for rash.  Neurological:  Negative for dizziness, sensory change, loss of consciousness, weakness and headaches.  Endo/Heme/Allergies:  Negative for environmental allergies. Does not bruise/bleed easily.  Psychiatric/Behavioral:  Negative for depression and suicidal ideas. The patient is not nervous/anxious and does not have insomnia.        Objective:    Physical Exam Vitals and nursing note reviewed.  HENT:     Right Ear: Tympanic membrane normal.     Left Ear: Tympanic membrane normal.     Nose: Congestion present.     Right Sinus: Maxillary sinus tenderness and frontal sinus tenderness present.     Left Sinus: Maxillary sinus tenderness and frontal sinus tenderness present.     Mouth/Throat:     Pharynx: Posterior oropharyngeal erythema present. No oropharyngeal exudate.  Lymphadenopathy:     Cervical: Cervical adenopathy present.  Skin:    Findings: Erythema and rash present. Rash is macular.         BP 112/80 (BP Location: Left Arm, Patient Position: Sitting, Cuff Size: Normal)   Pulse 82   Temp 98 F (36.7 C) (Oral)   Resp 18   Ht 5' 9.5 (1.765 m)   Wt 169 lb 6.4 oz (76.8 kg)   SpO2 97%   BMI 24.66 kg/m  Wt Readings from Last 3 Encounters:  02/08/24 169 lb 6.4 oz (76.8 kg)  02/05/24 171 lb 12.8 oz (77.9 kg)  02/01/24 172 lb 3.2 oz (78.1 kg)    Diabetic Foot Exam - Simple   No data filed    Lab Results  Component Value Date   WBC 8.6 11/27/2023   HGB 13.5 11/27/2023   HCT 40.1 11/27/2023   PLT 286 11/27/2023   GLUCOSE 112 (H) 11/27/2023   CHOL 122 11/27/2023   TRIG 112 11/27/2023   HDL 35 (L) 11/27/2023   LDLDIRECT 59.0 12/22/2022   LDLCALC 67 11/27/2023   ALT 25 11/27/2023   AST 15 11/27/2023   NA 142 11/27/2023   K 3.6 11/27/2023   CL 102 11/27/2023    CREATININE 0.93 11/27/2023   BUN 13 11/27/2023   CO2 30 11/27/2023   TSH 2.47 12/22/2022   PSA 0.52 12/22/2022   INR 1.11 02/10/2015   HGBA1C 7.0 (A) 02/05/2024   MICROALBUR 0.7 11/27/2023    Lab Results  Component Value Date   TSH 2.47 12/22/2022   Lab Results  Component Value Date   WBC 8.6 11/27/2023   HGB 13.5  11/27/2023   HCT 40.1 11/27/2023   MCV 90.1 11/27/2023   PLT 286 11/27/2023   Lab Results  Component Value Date   NA 142 11/27/2023   K 3.6 11/27/2023   CO2 30 11/27/2023   GLUCOSE 112 (H) 11/27/2023   BUN 13 11/27/2023   CREATININE 0.93 11/27/2023   BILITOT 0.8 11/27/2023   ALKPHOS 82 02/17/2023   AST 15 11/27/2023   ALT 25 11/27/2023   PROT 6.5 11/27/2023   ALBUMIN 4.3 02/17/2023   CALCIUM  9.3 11/27/2023   ANIONGAP 9 02/12/2015   EGFR 92 11/27/2023   GFR 75.23 02/17/2023   Lab Results  Component Value Date   CHOL 122 11/27/2023   Lab Results  Component Value Date   HDL 35 (L) 11/27/2023   Lab Results  Component Value Date   LDLCALC 67 11/27/2023   Lab Results  Component Value Date   TRIG 112 11/27/2023   Lab Results  Component Value Date   CHOLHDL 3.5 11/27/2023   Lab Results  Component Value Date   HGBA1C 7.0 (A) 02/05/2024       Assessment & Plan:  Acute non-recurrent pansinusitis -     Doxycycline  Hyclate; Take 1 tablet (100 mg total) by mouth 2 (two) times daily.  Dispense: 20 tablet; Refill: 0 -     Promethazine -DM; Take 5 mLs by mouth 4 (four) times daily as needed.  Dispense: 118 mL; Refill: 0  Rash -     Clotrimazole -Betamethasone ; Apply 1 Application topically daily.  Dispense: 30 g; Refill: 0  Assessment and Plan Assessment & Plan     Jamee JONELLE Antonio Cyndee, DO

## 2024-02-11 NOTE — Telephone Encounter (Signed)
 Samples no longer needed. Placed back in our stock  ONU:[id number] EXP:07/2025

## 2024-02-18 NOTE — Progress Notes (Deleted)
 Start: *** end: ***  1 fall with injury, no AD, no neg feeling, no FI, safe Motivated to manage;  Afraid;  Defeated / Burned out SMBG 3-4 times daily  Patient is here today ***   Miralax   Patient would like to learn *** Patient lives with ***.  *** shopping and cooking.  History includes:  *** Medications include:  *** Labs noted:  ***   7%, jardiance , met, ozempic    Mychart [facility] New Patient Paperwork   02/23/2024  8:10 PM EDT - Filed by Patient  Do you follow a special diet? No  Does your personal medical history include: GI Problems;  High Blood Pressure;  High Cholesterol;  Sleep Apnea;  Stroke  Please explain GI problems- some foods seem to activate allergy symptoms in sinuses and/or cause my stomach to hurt, I have to use Miralax  all of the time to avoid constipation, 3 mini strokes, diabetes  Does you family's medical history include: Cancer;  COPD;  GI Problems;  Heart Attack;  High Blood Pressure;  High Cholesterol;  Obesity;  Sleep Apnea;  Stroke  Please explain Dad- everything listed except for obesity & known cancer. Mom- Breast cancer that went to brain and liver & obesity.  Please list any relevant family history information not already mentioned   List any foods or medications you are allergic to. Amoxicillin, Sulfa  antibiotics.  Please list any previous surgeries or procedures including dates: Cataracts x 3 1962, 1967, 1975. Kidney Stones removal and stents 2000 something. 2016 GI BLEED- hospitalized. Etc..  List medications & supplements including dose and frequency: Check mychart all listed there  Do you have a Living Will or Health Care Power of Attorney? No  Have you had a fall in the past 12 months that has required medical care? Yes  Please explain the details of your fall Multiple falls, I triped over my dog and hurt my hand.  In the past 2 weeks, have you felt depressed, hopeless, or considered hurting yourself? No  Do you feel safe (free of threats and  abuse) in your home environment? Yes  Within the past 12 months, did you worry that your food would run out before you got money to buy more? Patient unable to answer  Within the past 12 months, did the food you bought just not last and you didn't have money to get more? No  What is the last grade level you completed in school? 12  Do you have any urgent questions for this appointment? Actual Dangers, Bads and good if any for sugar subs? Is aspartame bad/dangerous? Is Stevia good?  Are you currently following a meal plan? No  Are you taking your medications as prescribed? Yes  I authorize [facility] to bill my insurance plan(s) for services rendered. I understand that I am ultimately responsible for charges rendered and for pre-authorization requests. I Acknowledge  When were you diagnosed with Diabetes? 2013  How would you rate your overall health? Fair  How do you feel about having diabetes? Motivated to manage;  Afraid;  Defeated / Burned out  What is your most recent A1c? 7  What was your highest A1c? 10  How often do you test your blood sugar? 3 or 4 times per day  Have you had a dilated eye exam in the past 12 months? Yes  Have you had a dental exam in the past 12 months? No  How many days per week do you check your feet for sores, red spots,  skin cracks? 4  How many days per week do you exercise? 3  How many minutes per day do you exercise? 5  Have you had previous Diabetes Education? No

## 2024-02-24 ENCOUNTER — Ambulatory Visit: Admitting: Dietician

## 2024-02-24 DIAGNOSIS — E1159 Type 2 diabetes mellitus with other circulatory complications: Secondary | ICD-10-CM

## 2024-02-25 DIAGNOSIS — M797 Fibromyalgia: Secondary | ICD-10-CM | POA: Diagnosis not present

## 2024-02-25 DIAGNOSIS — M545 Low back pain, unspecified: Secondary | ICD-10-CM | POA: Diagnosis not present

## 2024-02-29 DIAGNOSIS — M545 Low back pain, unspecified: Secondary | ICD-10-CM | POA: Diagnosis not present

## 2024-02-29 DIAGNOSIS — M797 Fibromyalgia: Secondary | ICD-10-CM | POA: Diagnosis not present

## 2024-03-02 DIAGNOSIS — M797 Fibromyalgia: Secondary | ICD-10-CM | POA: Diagnosis not present

## 2024-03-02 DIAGNOSIS — M545 Low back pain, unspecified: Secondary | ICD-10-CM | POA: Diagnosis not present

## 2024-03-04 ENCOUNTER — Other Ambulatory Visit: Payer: Self-pay | Admitting: Family Medicine

## 2024-03-04 ENCOUNTER — Other Ambulatory Visit: Payer: Self-pay | Admitting: Internal Medicine

## 2024-03-04 DIAGNOSIS — E785 Hyperlipidemia, unspecified: Secondary | ICD-10-CM

## 2024-03-04 DIAGNOSIS — M545 Low back pain, unspecified: Secondary | ICD-10-CM | POA: Diagnosis not present

## 2024-03-04 DIAGNOSIS — M797 Fibromyalgia: Secondary | ICD-10-CM | POA: Diagnosis not present

## 2024-03-07 DIAGNOSIS — M545 Low back pain, unspecified: Secondary | ICD-10-CM | POA: Diagnosis not present

## 2024-03-07 DIAGNOSIS — M797 Fibromyalgia: Secondary | ICD-10-CM | POA: Diagnosis not present

## 2024-03-09 DIAGNOSIS — M545 Low back pain, unspecified: Secondary | ICD-10-CM | POA: Diagnosis not present

## 2024-03-09 DIAGNOSIS — M797 Fibromyalgia: Secondary | ICD-10-CM | POA: Diagnosis not present

## 2024-03-14 DIAGNOSIS — M797 Fibromyalgia: Secondary | ICD-10-CM | POA: Diagnosis not present

## 2024-03-14 DIAGNOSIS — M545 Low back pain, unspecified: Secondary | ICD-10-CM | POA: Diagnosis not present

## 2024-03-16 DIAGNOSIS — M545 Low back pain, unspecified: Secondary | ICD-10-CM | POA: Diagnosis not present

## 2024-03-16 DIAGNOSIS — M797 Fibromyalgia: Secondary | ICD-10-CM | POA: Diagnosis not present

## 2024-03-18 DIAGNOSIS — M797 Fibromyalgia: Secondary | ICD-10-CM | POA: Diagnosis not present

## 2024-03-18 DIAGNOSIS — M545 Low back pain, unspecified: Secondary | ICD-10-CM | POA: Diagnosis not present

## 2024-03-21 DIAGNOSIS — M545 Low back pain, unspecified: Secondary | ICD-10-CM | POA: Diagnosis not present

## 2024-03-21 DIAGNOSIS — M797 Fibromyalgia: Secondary | ICD-10-CM | POA: Diagnosis not present

## 2024-03-23 DIAGNOSIS — M545 Low back pain, unspecified: Secondary | ICD-10-CM | POA: Diagnosis not present

## 2024-03-23 DIAGNOSIS — M797 Fibromyalgia: Secondary | ICD-10-CM | POA: Diagnosis not present

## 2024-03-28 ENCOUNTER — Other Ambulatory Visit: Payer: Self-pay | Admitting: Family Medicine

## 2024-03-28 DIAGNOSIS — F322 Major depressive disorder, single episode, severe without psychotic features: Secondary | ICD-10-CM

## 2024-03-30 DIAGNOSIS — M797 Fibromyalgia: Secondary | ICD-10-CM | POA: Diagnosis not present

## 2024-03-30 DIAGNOSIS — M545 Low back pain, unspecified: Secondary | ICD-10-CM | POA: Diagnosis not present

## 2024-03-30 NOTE — Progress Notes (Signed)
 Diabetes Self-Management Education  Visit Type: First/Initial  Appt. Start Time: 1403 Appt. End Time: 1514  04/08/2024  [name], identified by name and date of birth, is a 63 y.o. male with a diagnosis of Diabetes: Type 2.   ASSESSMENT  Patient is here today alone. Pt c/o hearing difficulty and states his hearing aid batteries are not charged today. Pt c/o poor dentition and needing soft foods for ease of chewing. Patient would like to learn more about sugar substitute? Patient lives with his best friend and states shared shopping and cooking. Pt reports eating out twice weekly. Pt C/o GI problems  and constipation using miralax  daily. Pt reports he forgot to place his sensor; last value obtained Tuesday. All Pt's questions were answered during this encounter.   History includes:   Past Medical History:  Diagnosis Date   Abnormality of gait 11/10/2012   Acute GI bleeding    Acute renal insufficiency 02/10/2015   Acute upper respiratory infection 08/10/2012   Allergic drug reaction 05/24/2020   Allergic rhinitis 10/16/2020   Allergy    ANXIETY DEPRESSION 12/09/2007   Qualifier: Diagnosis of  By: Charlsie RN, Angeline MATSU    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) 03/12/2018   Cerebrovascular accident, old 11/16/2014   Chest discomfort 06/08/2019   Chronic back pain 11/28/2020   Chronic cough 08/27/2014   CXR 08/2014:  Mild hyperinflation, no acute process Spiro 09/2014:  Difficulty with procedure, but essentially normal +CVA's in past, with aspiration risk by  history Speech evaluation 2016:      Chronic left shoulder pain 08/06/2020   Chronic pain syndrome 11/23/2019   Chronic pansinusitis 06/30/2018   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   Constipation    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   DDD (degenerative disc disease), cervical 04/26/2019   DDD (degenerative disc disease), lumbar 04/26/2019   Decreased hearing of both ears 02/12/2013   Deviated septum 05/31/2018   Diabetes mellitus without complication (HCC)    Difficulty urinating    DM (diabetes mellitus) type II uncontrolled, periph vascular disorder 01/02/2016   Dry skin 05/09/2019   Dysuria 11/29/2010   Essential (primary) hypertension 01/26/2015   Excessive daytime sleepiness 02/11/2018   FATIGUE 12/09/2007   Qualifier: Diagnosis of  By: Charlsie OBIE Angeline MATSU    Fibromyalgia 03/02/2017   Food intolerance 05/09/2019   GERD 12/09/2007   Qualifier: Diagnosis of  By: Viviann Raddle MD, Marsha SAUNDERS    GERD (gastroesophageal reflux disease)    GI bleed 02/10/2015   Headache 12/23/2006   Qualifier: Diagnosis of   By: Charlsie OBIE Angeline MATSU     IMO SNOMED Dx Update Oct 2024     Hearing loss    Hemangioma 02/12/2013   Hemorrhoid 05/22/2011   HTN (hypertension) 02/04/2010   Qualifier: Diagnosis of  By: Domenica MD, Stacey     Hyperlipidemia    Hyperlipidemia associated with type 2 diabetes mellitus (HCC) 11/22/2020   Hyperlipidemia LDL goal <70 01/26/2015   Hypersomnia,  persistent 11/05/2012   epworth of 18 points, was placed on CPAP in 2008 with very low AHI ( no number  Mentioned) and struggles ever since with PAP. 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   Leg swelling    Localized swelling of both lower legs 03/02/2017   Low back pain 07/20/2007   Qualifier: Diagnosis of  By: Viviann Raddle MD, Marsha SAUNDERS    Lower extremity edema 07/04/2020   Lower GI bleed    Melena 12/16/2021   Mixed conductive and sensorineural hearing loss of right ear with restricted hearing of left ear 10/16/2020   Nasal congestion    Neck pain 03/15/2020   NEPHROLITHIASIS 08/13/2006   Qualifier: Diagnosis of  By: Geralene Service     Nocturia more than twice per night 02/11/2018   OSA on CPAP 10/17/2014   Otalgia of both ears 06/06/2021   Other chronic pain 03/02/2017   Other fatigue 07/04/2020   Pain in finger of right hand 10/06/2012   Pain in joint of right shoulder 02/21/2017   Palpitations 01/26/2015   Paresthesia 07/25/2010   Persistent atrial fibrillation (HCC) 08/29/2020   Persistent headaches 10/17/2014   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   Qualifier: Diagnosis of  By: Viviann Raddle MD, Marsha R    PONV (postoperative nausea and vomiting)    Preoperative clearance 07/04/2020   Preventative health care 11/26/2011   Prolapsed hemorrhoids 10/05/2013   Radiculopathy, lumbar region 03/15/2020   Rash 05/24/2019   Rash and other nonspecific skin eruption 07/23/2022   Raynaud's disease 03/02/2017   Raynaud's phenomenon 03/02/2017   Rectal bleeding 02/12/2015   Rectal pain    Rectal ulcer with bleeding after hemorrhoid banding 02/11/2015   Rhinitis, chronic 12/23/2006   Qualifier: Diagnosis of  By: Charlsie RN, Regina G    Right shoulder pain 02/15/2015   Seasonal and perennial allergic rhinitis 05/09/2019   Severe episode of recurrent major depressive disorder, 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   Sinus infection 06/30/2014   Sinusitis, acute, maxillary 02/02/2014   Sleep apnea    Sleep apnea with use of continuous positive airway pressure (CPAP) 02/11/2018   SOB (shortness  of breath) 07/04/2020   Stroke (HCC)    Stroke (HCC)    Subacute confusional state 10/17/2014   SYMPTOM, HYPERSOMNIA NOS 02/19/2007   Qualifier: Diagnosis of  By: Winona Raring     Thrombosed hemorrhoids    Tick bite of back 11/23/2019   Tinnitus of both ears 08/29/2020   Trouble swallowing    Unspecified hemorrhoids 02/10/2015   UTI (urinary tract infection) 05/19/2022   Varicose veins of bilateral lower extremities with other complications 08/10/2012   IMO SNOMED Dx Update Oct 2024     Vasomotor rhinitis 06/28/2021   Vertigo 03/19/2012    Medications include:   Current Outpatient Medications:    Accu-Chek Softclix Lancets lancets, Use as instructed 1x a day, Disp: 100 each, Rfl: PRN   allopurinol  (ZYLOPRIM ) 100 MG tablet, TAKE 1 TABLET BY MOUTH TWICE A DAY, Disp: 60 tablet, Rfl: 2   amLODipine  (NORVASC ) 2.5 MG tablet, TAKE 1 TABLET (2.5 MG TOTAL) BY MOUTH DAILY. MUST KEEP  APRIL'S APPT FOR ADDITIONAL REFILLS, Disp: 90 tablet, Rfl: 1   Azelastine  HCl (ASTEPRO ) 0.15 % SOLN, Place 1 spray into the nose in the morning and at bedtime., Disp: 1 mL, Rfl: 3   Azelastine  HCl 137 MCG/SPRAY SOLN, PLACE 1 SPRAY INTO BOTH NOSTRILS 2 (TWO) TIMES DAILY. USE IN EACH NOSTRIL AS DIRECTED, Disp: 30 mL, Rfl: 6   BD PEN NEEDLE NANO 2ND GEN 32G X 4 MM MISC, USE ONCE DAILY AS DIRECTED, Disp: 100 each, Rfl: 3   clotrimazole -betamethasone  (LOTRISONE ) cream, Apply 1 application  topically as needed for rash. (Patient not taking: Reported on 02/08/2024), Disp: , Rfl:    clotrimazole -betamethasone  (LOTRISONE ) cream, Apply 1 Application topically daily., Disp: 30 g, Rfl: 0   Continuous Glucose Sensor (FREESTYLE LIBRE 3 PLUS SENSOR) MISC, Inject 1 Device into the skin continuous. Change every 15 days, Disp: 6 each, Rfl: 3   cyclobenzaprine  (FLEXERIL ) 10 MG tablet, TAKE 1 TABLET BY MOUTH TWICE A DAY AS NEEDED FOR MUSCLE SPASM, Disp: 42 tablet, Rfl: 2   doxycycline  (VIBRA -TABS) 100 MG tablet, Take 1 tablet (100 mg  total) by mouth 2 (two) times daily., Disp: 20 tablet, Rfl: 0   empagliflozin  (JARDIANCE ) 25 MG TABS tablet, Take 1 tablet (25 mg total) by mouth daily before breakfast., Disp: , Rfl:    EPINEPHrine  0.3 mg/0.3 mL IJ SOAJ injection, Inject 0.3 mg into the muscle as needed for anaphylaxis., Disp: 2 each, Rfl: 0   famotidine  (PEPCID ) 40 MG/5ML suspension, Take 2.5 mLs (20 mg total) by mouth daily. Disregard after 30 days, Disp: 100 mL, Rfl: 2   FIBER SELECT GUMMIES PO, Take 1 tablet by mouth daily., Disp: , Rfl:    finasteride  (PROSCAR ) 5 MG tablet, Take 5 mg by mouth daily., Disp: , Rfl:    glucose blood (ACCU-CHEK GUIDE) test strip, Use as instructed 1x a day -, Disp: 100 each, Rfl: PRN   HYDROcodone -acetaminophen  (HYCET) 7.5-325 mg/15 ml solution, Take 10-15 mLs by mouth every 8 (eight) hours as needed for moderate pain (pain score 4-6)., Disp: 1200 mL, Rfl: 0   hydrocortisone  (ANUSOL -HC) 2.5 % rectal cream, PLACE 1 APPLICATION RECTALLY 2 (TWO) TIMES DAILY., Disp: 30 g, Rfl: 2   hydrOXYzine  (ATARAX ) 25 MG tablet, Take 1 tablet (25 mg total) by mouth every 8 (eight) hours as needed for anxiety or itching., Disp: 90 tablet, Rfl: 1   loratadine  (CLARITIN ) 10 MG tablet, Take 1 tablet (10 mg total) by mouth daily., Disp: 30 tablet, Rfl: 0   losartan  (COZAAR ) 100 MG tablet, TAKE 1 TABLET BY MOUTH EVERY DAY, Disp: 90 tablet, Rfl: 1   metFORMIN  (GLUCOPHAGE ) 500 MG tablet, TAKE 1 TABLET BY MOUTH 2 TIMES DAILY WITH A MEAL., Disp: 180 tablet, Rfl: 1   metoprolol  tartrate (LOPRESSOR ) 50 MG tablet, TAKE 1 TABLET BY MOUTH EVERY DAY, Disp: 90 tablet, Rfl: 1   naloxone  (NARCAN ) nasal spray 4 mg/0.1 mL, In case of opioid overdose, Disp: 2 each, Rfl: 1   polyethylene glycol powder (GAVILAX) 17 GM/SCOOP powder, Take 17 g by mouth daily as needed for moderate constipation., Disp: 238 g, Rfl: 5   pravastatin  (PRAVACHOL ) 20 MG tablet, Take 2 tablets (40 mg total) by mouth daily., Disp: 180 tablet, Rfl: 0   pregabalin   (LYRICA ) 100 MG capsule, Take 1 capsule (100 mg total) by mouth 3 (three) times daily., Disp: 90 capsule, Rfl: 4   promethazine -dextromethorphan (PROMETHAZINE -DM) 6.25-15 MG/5ML syrup, Take 5 mLs by mouth 4 (four) times daily as needed., Disp:  118 mL, Rfl: 0   Semaglutide , 1 MG/DOSE, (OZEMPIC , 1 MG/DOSE,) 4 MG/3ML SOPN, Inject 1 mg into the skin once a week., Disp: 9 mL, Rfl: 3   tamsulosin  (FLOMAX ) 0.4 MG CAPS capsule, Take 0.4 mg by mouth daily., Disp: , Rfl:    triamcinolone  (NASACORT ) 55 MCG/ACT AERO nasal inhaler, Place 2 sprays into the nose daily., Disp: 1 each, Rfl: 12   venlafaxine  XR (EFFEXOR -XR) 75 MG 24 hr capsule, Take 1 capsule (75 mg total) by mouth daily with breakfast., Disp: 90 capsule, Rfl: 1   Labs noted:   Lab Results  Component Value Date   HGBA1C 7.0 (A) 02/05/2024   Lab Results  Component Value Date   CHOL 122 11/27/2023   HDL 35 (L) 11/27/2023   LDLCALC 67 11/27/2023   LDLDIRECT 59.0 12/22/2022   TRIG 112 11/27/2023   CHOLHDL 3.5 11/27/2023   Lab Results  Component Value Date   NA 142 11/27/2023   CL 102 11/27/2023   K 3.6 11/27/2023   CO2 30 11/27/2023   BUN 13 11/27/2023   CREATININE 0.93 11/27/2023   EGFR 92 11/27/2023   CALCIUM  9.3 11/27/2023   PHOS 3.5 02/11/2015   ALBUMIN 4.3 02/17/2023   GLUCOSE 112 (H) 11/27/2023   BP Readings from Last 3 Encounters:  02/08/24 112/80  02/05/24 126/78  02/01/24 132/83   Wt Readings from Last 3 Encounters:  02/08/24 169 lb 6.4 oz (76.8 kg)  02/05/24 171 lb 12.8 oz (77.9 kg)  02/01/24 172 lb 3.2 oz (78.1 kg)     Diabetes Self-Management Education - 04/08/24 1404       Visit Information   Visit Type First/Initial      Initial Visit   Diabetes Type Type 2    Date Diagnosed 2013    Are you currently following a meal plan? No    Are you taking your medications as prescribed? Yes      Health Coping   How would you rate your overall health? Fair      Psychosocial Assessment   Patient  Belief/Attitude about Diabetes Other (comment)   Motivated to manage;  Afraid;  Defeated / Burned out,   What is the hardest part about your diabetes right now, causing you the most concern, or is the most worrisome to you about your diabetes?   Making healty food and beverage choices    Self-care barriers Unsteady gait/risk for falls;Other (comment);Unable to determine   cognition/memory   Self-management support Doctor's office    Other persons present Patient    Patient Concerns Nutrition/Meal planning;Problem Solving;Support    Special Needs Unable to determine    Preferred Learning Style No preference indicated    Learning Readiness Ready    What is the last grade level you completed in school? 12th      Pre-Education Assessment   Patient understands the diabetes disease and treatment process. Needs Instruction    Patient understands incorporating nutritional management into lifestyle. Needs Instruction    Patient undertands incorporating physical activity into lifestyle. Needs Review    Patient understands using medications safely. Comprehends key points    Patient understands monitoring blood glucose, interpreting and using results Comprehends key points    Patient understands prevention, detection, and treatment of acute complications. Needs Review    Patient understands prevention, detection, and treatment of chronic complications. Needs Review    Patient understands how to develop strategies to address psychosocial issues. Needs Review    Patient understands how to  develop strategies to promote health/change behavior. Needs Review      Complications   Last HgB A1C per patient/outside source 7 %    How often do you check your blood sugar? 3-4 times/day    Fasting Blood glucose range (mg/dL) >799;29-870;869-820;819-799    Postprandial Blood glucose range (mg/dL) >799;29-870;869-820;819-799    Number of hypoglycemic episodes per month 0    Number of hyperglycemic episodes (  >200mg /dL): Daily    Can you tell when your blood sugar is high? Yes    What do you do if your blood sugar is high? wait    Have you had a dilated eye exam in the past 12 months? Yes    Have you had a dental exam in the past 12 months? No    Are you checking your feet? Yes    How many days per week are you checking your feet? 4      Dietary Intake   Breakfast skips or 2-4 toaster sticks or 3 pancakes with butter, SF syrup, water    Snack (morning) 6-7 veggie popable chips    Lunch lasagna made with beef, cheese, ollie pop    Dinner meat loaf, sweet potato casserole, ollie pop    Snack (evening) dessert cake or ice cream    Beverage(s) ollie pop, water, almond milk      Activity / Exercise   Activity / Exercise Type Light (walking / raking leaves)    How many days per week do you exercise? 3    How many minutes per day do you exercise? 5    Total minutes per week of exercise 15      Patient Education   Previous Diabetes Education No    Disease Pathophysiology Definition of diabetes, type 1 and 2, and the diagnosis of diabetes;Explored patient's options for treatment of their diabetes    Healthy Eating Role of diet in the treatment of diabetes and the relationship between the three main macronutrients and blood glucose level;Plate Method;Reviewed blood glucose goals for pre and post meals and how to evaluate the patients' food intake on their blood glucose level.;Meal timing in regards to the patients' current diabetes medication.;Food label reading, portion sizes and measuring food.    Being Active Role of exercise on diabetes management, blood pressure control and cardiac health.    Medications Reviewed patients medication for diabetes, action, purpose, timing of dose and side effects.    Monitoring Identified appropriate SMBG and/or A1C goals.    Acute complications Taught prevention, symptoms, and  treatment of hypoglycemia - the 15 rule.    Chronic complications Relationship  between chronic complications and blood glucose control;Dental care;Retinopathy and reason for yearly dilated eye exams;Assessed and discussed foot care and prevention of foot problems    Diabetes Stress and Support Worked with patient to identify barriers to care and solutions;Identified and addressed patients feelings and concerns about diabetes    Lifestyle and Health Coping Lifestyle issues that need to be addressed for better diabetes care      Individualized Goals (developed by patient)   Nutrition Follow meal plan discussed    Physical Activity Exercise 3-5 times per week;15 minutes per day;30 minutes per day    Medications take my medication as prescribed    Monitoring  Test my blood glucose as discussed    Problem Solving Eating Pattern;Addressing barriers to behavior change    Reducing Risk examine blood glucose patterns;do foot checks daily    Health Coping Ask  for help with psychological, social, or emotional issues      Post-Education Assessment   Patient understands the diabetes disease and treatment process. Needs Review    Patient understands incorporating nutritional management into lifestyle. Needs Review    Patient undertands incorporating physical activity into lifestyle. Needs Review    Patient understands using medications safely. Comphrehends key points    Patient understands monitoring blood glucose, interpreting and using results Needs Review    Patient understands prevention, detection, and treatment of acute complications. Needs Review    Patient understands prevention, detection, and treatment of chronic complications. Needs Review    Patient understands how to develop strategies to address psychosocial issues. Needs Review    Patient understands how to develop strategies to promote health/change behavior. Needs Review      Outcomes   Expected Outcomes Demonstrated interest in learning. Expect positive outcomes    Future DMSE 3-4 months    Program Status Not  Completed          Individualized Plan for Diabetes Self-Management Training:   Learning Objective:  Patient will have a greater understanding of diabetes self-management. Patient education plan is to attend individual and/or group sessions per assessed needs and concerns.   Plan:   Patient Instructions  Increase water intake aiming for 64-80 ounces daily   Aim for balanced meals and snacks using the plate planner         Expected Outcomes:  Demonstrated interest in learning. Expect positive outcomes  Education material provided: ADA - How to Thrive: A Guide for Your Journey with Diabetes, My Plate, Snack sheet, Support group flyer, and Diabetes Resources  If problems or questions, patient to contact team via:  Phone  Future DSME appointment: 3-4 months

## 2024-04-01 DIAGNOSIS — M797 Fibromyalgia: Secondary | ICD-10-CM | POA: Diagnosis not present

## 2024-04-02 ENCOUNTER — Other Ambulatory Visit: Payer: Self-pay | Admitting: Family Medicine

## 2024-04-04 ENCOUNTER — Encounter: Attending: Physical Medicine & Rehabilitation | Admitting: Physical Medicine & Rehabilitation

## 2024-04-04 ENCOUNTER — Encounter: Payer: Self-pay | Admitting: Physical Medicine & Rehabilitation

## 2024-04-04 DIAGNOSIS — G894 Chronic pain syndrome: Secondary | ICD-10-CM | POA: Insufficient documentation

## 2024-04-04 DIAGNOSIS — M797 Fibromyalgia: Secondary | ICD-10-CM | POA: Insufficient documentation

## 2024-04-04 DIAGNOSIS — F322 Major depressive disorder, single episode, severe without psychotic features: Secondary | ICD-10-CM | POA: Insufficient documentation

## 2024-04-04 DIAGNOSIS — Z79899 Other long term (current) drug therapy: Secondary | ICD-10-CM | POA: Insufficient documentation

## 2024-04-06 DIAGNOSIS — M545 Low back pain, unspecified: Secondary | ICD-10-CM | POA: Diagnosis not present

## 2024-04-06 DIAGNOSIS — M797 Fibromyalgia: Secondary | ICD-10-CM | POA: Diagnosis not present

## 2024-04-08 ENCOUNTER — Encounter: Attending: Internal Medicine | Admitting: Dietician

## 2024-04-08 DIAGNOSIS — E1165 Type 2 diabetes mellitus with hyperglycemia: Secondary | ICD-10-CM | POA: Insufficient documentation

## 2024-04-08 DIAGNOSIS — E1159 Type 2 diabetes mellitus with other circulatory complications: Secondary | ICD-10-CM | POA: Diagnosis present

## 2024-04-08 DIAGNOSIS — M545 Low back pain, unspecified: Secondary | ICD-10-CM | POA: Diagnosis not present

## 2024-04-08 DIAGNOSIS — M797 Fibromyalgia: Secondary | ICD-10-CM | POA: Diagnosis not present

## 2024-04-08 NOTE — Patient Instructions (Addendum)
 Increase water intake aiming for 64-80 ounces daily   Aim for balanced meals and snacks using the plate planner

## 2024-04-11 DIAGNOSIS — M797 Fibromyalgia: Secondary | ICD-10-CM | POA: Diagnosis not present

## 2024-04-11 DIAGNOSIS — M545 Low back pain, unspecified: Secondary | ICD-10-CM | POA: Diagnosis not present

## 2024-04-12 ENCOUNTER — Encounter: Payer: Self-pay | Admitting: Physical Medicine & Rehabilitation

## 2024-04-12 ENCOUNTER — Encounter: Admitting: Physical Medicine & Rehabilitation

## 2024-04-12 VITALS — BP 117/77 | HR 109 | Ht 69.5 in | Wt 168.4 lb

## 2024-04-12 DIAGNOSIS — M797 Fibromyalgia: Secondary | ICD-10-CM | POA: Diagnosis not present

## 2024-04-12 DIAGNOSIS — G894 Chronic pain syndrome: Secondary | ICD-10-CM

## 2024-04-12 DIAGNOSIS — F322 Major depressive disorder, single episode, severe without psychotic features: Secondary | ICD-10-CM | POA: Diagnosis not present

## 2024-04-12 DIAGNOSIS — Z79899 Other long term (current) drug therapy: Secondary | ICD-10-CM | POA: Diagnosis not present

## 2024-04-12 MED ORDER — VENLAFAXINE HCL ER 37.5 MG PO CP24: 37.5000 mg | ORAL_CAPSULE | Freq: Every day | ORAL | 2 refills | Status: AC

## 2024-04-12 NOTE — Progress Notes (Signed)
 Subjective:    Patient ID: Chase [name], male    DOB: [birth year], 63 y.o.   MRN: [id number]  HPI Chase [name] is a 63 y.o. 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) (03/12/2018), Cerebrovascular accident, old (11/16/2014), Chest discomfort (06/08/2019), Chronic back pain (11/28/2020), Chronic cough (08/27/2014), Chronic left shoulder pain (08/06/2020), Chronic pain syndrome (11/23/2019), Chronic pansinusitis (06/30/2018), Chronic prostatitis (12/06/2013), 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), Constipation, 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), DDD (degenerative disc disease), cervical (04/26/2019), DDD (degenerative disc disease), lumbar (04/26/2019), Decreased hearing of both ears (02/12/2013), Deviated septum (05/31/2018), Diabetes mellitus without complication (HCC), Difficulty urinating, DM (diabetes mellitus) type II uncontrolled, periph vascular disorder (01/02/2016), Dry skin (05/09/2019), Dysuria (11/29/2010), Essential (primary) hypertension (01/26/2015), Excessive daytime sleepiness (02/11/2018), FATIGUE (12/09/2007), Fibromyalgia (03/02/2017), Food intolerance (05/09/2019), GERD (12/09/2007), GERD (gastroesophageal reflux  disease), GI bleed (02/10/2015), Headache(784.0), Hearing loss, 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/12/2013), Hemorrhoid (05/22/2011), HTN (hypertension) (02/04/2010), Hyperlipidemia, Hyperlipidemia associated with type 2 diabetes mellitus (HCC) (11/22/2020), Hyperlipidemia LDL goal <70 (01/26/2015), Hypersomnia, persistent (11/05/2012), Hypertension, Hypokalemia (08/29/2020), Hypotension (07/04/2020), 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), 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), Increasing residual urine (03/10/2022), Insect bite (03/16/2018), Insomnia (08/05/2011), Internal and external hemorrhoids without complication (10/04/2012), Internal hemorrhoids (10/22/2014), 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), Leg swelling, Localized swelling of both lower legs (03/02/2017), Low back pain (07/20/2007), Lower extremity edema (07/04/2020), Lower GI bleed, Melena (12/16/2021), Mixed conductive and sensorineural hearing loss of right ear with restricted hearing of left ear (10/16/2020), Nasal congestion, Neck pain (03/15/2020), NEPHROLITHIASIS (08/13/2006), Nocturia more than twice per night (02/11/2018), OSA on CPAP (10/17/2014), Otalgia of both ears (06/06/2021), Other chronic pain (03/02/2017), Other fatigue (07/04/2020), Pain in finger of right hand (10/06/2012), Pain in joint of right shoulder (02/21/2017), Palpitations (01/26/2015), Paresthesia (07/25/2010), Persistent atrial fibrillation (HCC) (08/29/2020), Persistent headaches (10/17/2014), 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), PONV (postoperative nausea and vomiting), Preoperative clearance (07/04/2020), Prolapsed hemorrhoids (10/05/2013), Radiculopathy, lumbar region (03/15/2020), Rash (05/24/2019), Rash and other nonspecific skin eruption (07/23/2022), Raynaud's disease (03/02/2017), Raynaud's phenomenon (03/02/2017), Rectal bleeding (02/12/2015), Rectal pain, Rectal ulcer with bleeding after hemorrhoid banding (02/11/2015), Rhinitis, chronic  (12/23/2006), Right shoulder pain (02/15/2015), Seasonal and perennial allergic rhinitis (05/09/2019), Severe episode of recurrent major depressive disorder, without psychotic features (HCC) (08/29/2020), Sinus infection (06/30/2014), Sinusitis, acute, maxillary (02/02/2014), Sleep apnea, Sleep apnea with use of continuous positive airway pressure (CPAP) (02/11/2018), SOB (shortness of breath) (07/04/2020), Stroke (HCC), Stroke (HCC), Subacute confusional state (10/17/2014), SYMPTOM, HYPERSOMNIA NOS (02/19/2007), Thrombosed hemorrhoids, Tick bite of back (11/23/2019), Tinnitus of both ears (08/29/2020), Trouble swallowing, Unspecified hemorrhoids (02/10/2015), Unspecified sleep apnea (11/05/2012), UTI (urinary tract infection) (05/19/2022), Varicose veins of lower extremities with other complications (08/10/2012), Vasomotor rhinitis (06/28/2021), and Vertigo (03/19/2012).   They are presenting to PM&R clinic as a new patient for pain management evaluation. They were referred by Dr. Antonio Meth for treatment of chronic pain.    Chase [name] reports he has had chronic pain for about 10  years.  His speech is limited due to history of CVAs with aphasia.  Patient has neck pain with pain affecting his bilateral arms.  He also has lower back pain and pain in his legs.  He also has burning, pins-and-needles and tingling pain in his feet, suspected to be due to diabetes mellitus.  Walking and standing worsens his pain.  Pending backwards is also painful.  Patient indicates he has pain throughout his entire body.  He reports he was previously followed by Dr. Ernesto for his pain.  Patient reports he had injections in his back or neck and is unsure if this is beneficial.  He indicates he was later followed by other doctors that were prescribing him medications.  He has trouble swallowing pills so is taking liquid hydrocodone .  He says this medication is helping keep his pain under control.  He also takes gabapentin  100 mg 3 times  daily which she feels like is beneficial to the pain in his feet however he does not keep it controlled.   Chart review indicates he was previously diagnosed with fibromyalgia.   He reports history of Raynaud's phenomena   Patient is a poor historian and limitations due to aphasia         Red flag symptoms: No red flags for back pain endorsed in Hx or ROS   Medications tried:   Nsaids - limited by comorbidities  Tylenol  - helps Opiates  hydrocodone   Gabapentin - TID 100mg ,helping  TCAs  -denies, doesn't recall  SNRIs  - effexor  helps with mood      Other treatments: PT- helped in the past  Chiropractor TENs unit - Injections - back or neck injections not sure if this helped  Surgery- denies    Goals for pain control: Decrease overall body wide pain       Interval History 02/26/23 Chase [name] is here for follow-up regarding his back and neck pain, pain related to peripheral neuropathy, fibromyalgia.  He reports that pain is largely unchanged from last visit.  He has not consistently tried taking gabapentin  200 mg 3 times daily and has mostly been taking it at the 100 mg dose.  He has been having more pain in his left great toe, being treated with antibiotics by his PCP.  He has been walking with physical therapy and feels like this has been helping his balance and strength.  Physical therapy has also had a little benefit to his pain.  He reports he forgot to get records sent over from his prior pain clinic.  He says he stopped going there because they started doing more procedure treatments.  He says they referred him to a different provider but he did not follow-up on this.  He reports his pain was better controlled when he was taking the hydrocodone  liquid.  He was able to walk a lot farther and do a lot more at home.   Interval History 03/26/23 Patient is here for follow-up regarding his chronic pain.  Pain is greatest in his neck, back, legs.  Patient does think that Lyrica  has  been beneficial, not having any side effects at this time.  He continues to be frustrated by his aphasia, limiting his ability to communicate.  Interval History 04/23/2023 Chase [name] is here for follow-up regarding his chronic body wide pain.  Pain is the worst in his neck, lower back and legs.  Patient does feel that Lyrica  is reducing his pain, however not keeping it under good control.  Patient  had insurance issues with the pharmacy and was unable to pick up his hydrocodone  liquid, all of this appears to be resolved and pharmacy is ordering the medication for pickup.  Mood continues to be decreased and he feels depressed at times.  No SI or HI.  Interval History 06/25/23 Patient continues to have severe pain in his back and neck.  He also has milder pain in his arms and legs.  Patient reports benefit with Lyrica , feels like it is helping to reduce the pain in his legs. Patient reports liquid hydrocodone  liquid 10 mL providing mild benefit to his pain.patient. Reports it worked much better in the past when he used 15 ml dose.   Patient is difficult historian due to expressive aphasia.  Interval History 07/22/2022 Patient reports continued severe pain throughout his whole back.  The next worst area of pain as throughout his legs.  He also has severe pain in his neck and shoulders.  Hydrocodone  liquid is helping, he is using it sparingly when pain is only very severe.  He continues to use Lyrica  75 mg twice daily.  Reports he is tolerating both of these medications.Occasional constipation controlled with laxatives.  Ontinues to have pins-and-needles pain in his feet.   Interval History 09/04/22 Patient reports continued pain throughout his whole back, worse in the lumbar spine.  Reports also having pain in his hands, shoulders, feet.  Hydrocodone  is helping reduce the back pain and when it is severe.  He is also taking Lyrica  which is helping his overall body wide pain.  Patient reports he tried 1 or 2  sessions of aquatic therapy but stopped going-lack of motivation.  He is willing to try this again.  Communication more difficult due to chronic aphasia.  Interval History 10/19/23 Pt has continued pain in multiple area of his body but it is worse in his lower back. Also a lot of pain in his bilateral shoulders and R wrist.  Patient has not yet started physical therapy, reports he was busy recently.  Hydrocodone  liquid has been helping reduce his pain, often does not last long enough.  This medication has been helping him be more active at home and complete chores around the house.   Interval History 12/07/23 Patient reports he continues to have pain in his lower back that is very severe and limiting.  He has pain in other joints throughout his body but this is less intense.  He reports he did 1 treatment with aquatic therapy at [facility].  He says he did not feel very comfortable being at aquatic therapy and did not pursue further visits.  He continues to use hydrocodone  liquid and Lyrica  which help reduce his pain.  He tries to use hydrocodone  sparingly only when the pain is very severe.  He uses liquid hydrocodone  because he could not swallow the tablets previously, tablets are too big.  Reports he had follow-up with neurosurgery, no surgical intervention recommended.  Interval History 02/01/24  Reports generalized pain throughout body with lower back being one of the worst areas.  Pain rated as moderate intensity. Uses hydrocodone  sparingly, only when needed, still has some supply remaining from previous prescription. Sometimes takes dose  before yard work or riding lawnmower to avoid pain flare-ups.  - Lyrica  (currently twice daily) - reports helpful for pain, no significant side effects - Hydrocodone  - uses on occasion, no side effects noted - Miralax  - takes regularly for constipation management  Interval History 04/12/24 Reports feeling lousy with generalized, all-over  body pain. States has  been sleeping only four hours. Identifies the neck, entire back, and legs as the most painful areas. Arms also hurt. Reports mood is depressed.  Medications - Pregabalin  (Lyrica ): Prescribed three times a day but has only been taking it twice a day. Unsure if it is providing benefit. - Hydrocodone -acetaminophen : Uses as needed for severe pain. Reports it helps a little bit. - Venlafaxine  XR (Effexor ): Currently taking 75 mg for mood. Taking in the morning. - Acetaminophen  (Tylenol ): Uses in between doses of hydrocodone -acetaminophen .  Average Pain 5 Pain Right Now 6 My pain is sharp, burning, dull, stabbing, tingling, and aching  In the last 24 hours, has pain interfered with the following? General activity 7 Relation with others 7 Enjoyment of life 7 What TIME of day is your pain at its worst? morning , daytime, evening, and night Sleep (in general) Fair  Pain is worse with: walking, bending, sitting, inactivity, standing, and some activites Pain improves with: Rest, heat, medication Relief from Meds: 4  Family History  Problem Relation Age of Onset   Breast cancer Mother 29       breast   Huntington's disease Mother    Stroke Father    Heart disease Father    Hypertension Father    Heart attack Father    Hypertension Brother    Hyperlipidemia Neg Hx    Diabetes Neg Hx    Social History   Socioeconomic History   Marital status: Single    Spouse name: Not on file   Number of children: 0   Years of education: Not on file   Highest education level: 12th grade  Occupational History   Not on file  Tobacco Use   Smoking status: Never   Smokeless tobacco: Never  Vaping Use   Vaping status: Never Used  Substance and Sexual Activity   Alcohol  use: No    Alcohol /week: 0.0 standard drinks of alcohol     Comment: none   Drug use: No   Sexual activity: Never  Other Topics Concern   Not on file  Social History Narrative   No caffeine intake except for chocolate.   Exercised-less walking    Social Drivers of Health   Financial Resource Strain: Patient Declined (11/25/2023)   Overall Financial Resource Strain (CARDIA)    Difficulty of Paying Living Expenses: Patient declined  Food Insecurity: Food Insecurity Present (04/08/2024)   Hunger Vital Sign    Worried About Running Out of Food in the Last Year: Sometimes true    Ran Out of Food in the Last Year: Sometimes true  Transportation Needs: Unmet Transportation Needs (02/07/2024)   PRAPARE - Administrator, Civil Service (Medical): Yes    Lack of Transportation (Non-Medical): Not on file  Physical Activity: Unknown (02/07/2024)   Exercise Vital Sign    Days of Exercise per Week: 1 day    Minutes of Exercise per Session: Not on file  Stress: Stress Concern Present (02/07/2024)   [employer] of Occupational Health - Occupational Stress Questionnaire    Feeling of Stress: To some extent  Social Connections: Unknown (02/07/2024)   Social Connection and Isolation Panel    Frequency of Communication with Friends and Family: Not on file    Frequency of Social Gatherings with Friends and Family: Not on file    Attends Religious Services: Not on file    Active Member of Clubs or Organizations: No    Attends Banker Meetings: Not on  file    Marital Status: Not on file   Past Surgical History:  Procedure Laterality Date   CATARACT EXTRACTION     x 3   EYE SURGERY  1968, 1985, 1987   cataracts   FLEXIBLE SIGMOIDOSCOPY N/A 02/11/2015   Procedure: FLEXIBLE SIGMOIDOSCOPY;  Surgeon: Lupita FORBES Commander, MD;  Location: [facility];  Service: Endoscopy;  Laterality: N/A;   INNER EAR SURGERY     rt ear   INNER EAR SURGERY  1992   surgery - right arm     1983   Past Surgical History:  Procedure Laterality Date   CATARACT EXTRACTION     x 3   EYE SURGERY  1968, 1985, 1987   cataracts   FLEXIBLE SIGMOIDOSCOPY N/A 02/11/2015   Procedure: FLEXIBLE SIGMOIDOSCOPY;  Surgeon: Lupita FORBES Commander, MD;  Location: [facility];  Service: Endoscopy;  Laterality: N/A;   INNER EAR SURGERY     rt ear   INNER EAR SURGERY  1992   surgery - right arm     1983   Past Medical History:  Diagnosis Date   Abnormality of gait 11/10/2012   Acute GI bleeding    Acute renal insufficiency 02/10/2015   Acute upper respiratory infection 08/10/2012   Allergic drug reaction 05/24/2020   Allergic rhinitis 10/16/2020   Allergy    ANXIETY DEPRESSION 12/09/2007   Qualifier: Diagnosis of  By: Charlsie RN, Angeline MATSU    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) 03/12/2018   Cerebrovascular accident, old 11/16/2014   Chest discomfort 06/08/2019   Chronic back pain 11/28/2020   Chronic cough 08/27/2014   CXR 08/2014:  Mild hyperinflation, no acute process Spiro 09/2014:  Difficulty with procedure, but essentially normal +CVA's in past, with aspiration risk by history Speech evaluation 2016:      Chronic left shoulder pain 08/06/2020   Chronic pain syndrome 11/23/2019   Chronic pansinusitis 06/30/2018   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   Constipation    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   DDD (degenerative disc disease), cervical 04/26/2019   DDD (degenerative disc disease), lumbar 04/26/2019   Decreased hearing of both ears 02/12/2013   Deviated septum 05/31/2018   Diabetes mellitus without complication (HCC)    Difficulty urinating    DM (diabetes mellitus) type II uncontrolled, periph vascular disorder 01/02/2016   Dry skin 05/09/2019    Dysuria 11/29/2010   Essential (primary) hypertension 01/26/2015   Excessive daytime sleepiness 02/11/2018   FATIGUE 12/09/2007   Qualifier: Diagnosis of  By: Charlsie OBIE Angeline MATSU    Fibromyalgia 03/02/2017   Food intolerance 05/09/2019   GERD 12/09/2007   Qualifier: Diagnosis of  By: Viviann Raddle MD, Marsha SAUNDERS    GERD (gastroesophageal reflux disease)    GI bleed 02/10/2015   Headache 12/23/2006   Qualifier: Diagnosis of   By: Charlsie OBIE Angeline MATSU     IMO SNOMED Dx Update Oct 2024     Hearing loss    Hemangioma 02/12/2013  Hemorrhoid 05/22/2011   HTN (hypertension) 02/04/2010   Qualifier: Diagnosis of  By: Domenica MD, Stacey     Hyperlipidemia    Hyperlipidemia associated with type 2 diabetes mellitus (HCC) 11/22/2020   Hyperlipidemia LDL goal <70 01/26/2015   Hypersomnia, persistent 11/05/2012   epworth of 18 points, was placed on CPAP in 2008 with very low AHI ( no number  Mentioned) and struggles ever since with PAP. 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   Leg swelling    Localized swelling of both lower legs 03/02/2017   Low back pain 07/20/2007   Qualifier: Diagnosis of  By: Viviann Raddle MD, Marsha SAUNDERS    Lower extremity edema 07/04/2020   Lower GI bleed    Melena 12/16/2021   Mixed conductive and sensorineural hearing loss of right ear with restricted hearing of left ear 10/16/2020   Nasal congestion    Neck pain 03/15/2020   NEPHROLITHIASIS 08/13/2006   Qualifier: Diagnosis of  By: Geralene Service     Nocturia more than twice per night 02/11/2018   OSA on CPAP 10/17/2014   Otalgia of both ears 06/06/2021    Other chronic pain 03/02/2017   Other fatigue 07/04/2020   Pain in finger of right hand 10/06/2012   Pain in joint of right shoulder 02/21/2017   Palpitations 01/26/2015   Paresthesia 07/25/2010   Persistent atrial fibrillation (HCC) 08/29/2020   Persistent headaches 10/17/2014   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   Qualifier: Diagnosis of  By: Viviann Raddle MD, Marsha R    PONV (postoperative nausea and vomiting)    Preoperative clearance 07/04/2020   Preventative health care 11/26/2011   Prolapsed hemorrhoids 10/05/2013   Radiculopathy, lumbar region 03/15/2020   Rash 05/24/2019   Rash and other nonspecific skin eruption 07/23/2022   Raynaud's disease 03/02/2017   Raynaud's phenomenon 03/02/2017   Rectal bleeding 02/12/2015   Rectal pain    Rectal ulcer with bleeding after hemorrhoid banding 02/11/2015   Rhinitis, chronic 12/23/2006   Qualifier: Diagnosis of  By: Charlsie RN, Regina G    Right shoulder pain 02/15/2015   Seasonal and perennial allergic rhinitis 05/09/2019   Severe episode of recurrent major depressive disorder, 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   Sinus infection 06/30/2014   Sinusitis, acute, maxillary 02/02/2014   Sleep apnea    Sleep apnea with use of continuous positive airway pressure (CPAP) 02/11/2018   SOB (shortness of breath) 07/04/2020   Stroke (HCC)    Stroke (HCC)    Subacute confusional state 10/17/2014   SYMPTOM, HYPERSOMNIA NOS 02/19/2007   Qualifier: Diagnosis of  By: Winona Raring     Thrombosed hemorrhoids    Tick bite of back 11/23/2019   Tinnitus of both ears 08/29/2020   Trouble swallowing    Unspecified hemorrhoids 02/10/2015   UTI (urinary tract infection) 05/19/2022   Varicose veins of bilateral lower extremities with other complications 08/10/2012   IMO SNOMED Dx Update Oct 2024     Vasomotor rhinitis 06/28/2021   Vertigo 03/19/2012   There were no vitals taken for this visit.  Opioid Risk Score:   Fall Risk Score:   `1  Depression screen PHQ 2/9     04/08/2024    2:03 PM 02/01/2024    2:18 PM 12/07/2023    2:20 PM 10/19/2023    3:25 PM 09/04/2023    3:46 PM 07/23/2023    2:10 PM 06/25/2023    2:54 PM  Depression screen PHQ 2/9  Decreased Interest 0 1 2  2 1    Down, Depressed, Hopeless 0 1 2  1 1    PHQ - 2 Score 0 2 4  3 2    Altered sleeping  0  2 2  2   Tired, decreased energy  2  2 2  2   Change in appetite  1   1    Feeling bad or failure about yourself   2   2    Trouble concentrating  3   2    Moving slowly or fidgety/restless  2   3    Suicidal thoughts  0   0    PHQ-9 Score  12   15    Difficult doing work/chores  Very difficult   Somewhat difficult        Review of Systems  Constitutional: Negative.   HENT:  Negative for voice change.   Eyes:  Positive for visual disturbance.  Respiratory: Negative.    Cardiovascular: Negative.   Gastrointestinal: Negative.   Endocrine: Negative.   Genitourinary: Negative.   Musculoskeletal:  Positive for back pain, gait problem and neck pain. Negative for neck stiffness.       Pain in both shoulders, Feet, legs, arms, back  Skin: Negative.   Allergic/Immunologic: Negative.   Neurological:  Positive for dizziness, weakness, light-headedness and headaches.       Weakness in both hands  Hematological: Negative.   Psychiatric/Behavioral:  Positive for dysphoric mood.   All other systems reviewed and are negative.      Objective:   Physical Exam  Gen: no distress, normal appearing HEENT: oral mucosa pink and moist, NCAT Chest: normal effort, normal rate of breathing Abd: soft, non-distended Ext: no edema Psych: Frustrated with his language deficits skin: Warm and dry Neuro: Alert and awake, follows commands, cranial nerves II through XII grossly intact Patient has significant aphasia, primarily expressive Sensory exam normal for light touch in all 4 limbs-intact to light touch in both feet throughout   Musculoskeletal:  Kyphotic  posture Tender throughout all 4 limbs and throughout spine  Lumbar spine MRI 10/19/23 IEXAM: MRI LUMBAR SPINE WITHOUT CONTRAST   TECHNIQUE: Multiplanar, multisequence MR imaging of the lumbar spine was performed. No intravenous contrast was administered.   COMPARISON:  MRI of the lumbar spine dated 06/04/2021   FINDINGS: Segmentation: Standard.   Alignment:  Physiologic lumbar alignment is maintained.   Vertebrae: Vertebral bodies demonstrate normal signal intensity. No compression fractures.   Conus medullaris and cauda equina: The conus medullaris terminates at the level of L1-L2. The distal spinal cord signal intensity is normal. Stable intradural lesion at the level of L2 measuring approximately 1 cm.   Paraspinal and other soft tissues: Right renal cysts. The visualized aorta is normal.   Disc levels:   L1-L2: Disc is normal in configuration. No facet arthropathy. No neuroforaminal stenosis. No spinal canal stenosis.   L2-L3: Disc is normal in configuration. Moderate bilateral facet arthropathy. No neuroforaminal stenosis. No spinal canal stenosis.   L3-L4: Disc bulge. Moderate bilateral facet arthropathy. Mild bilateral neuroforaminal stenosis. Mild spinal canal stenosis.   L4-L5: Disc bulge. Moderate bilateral facet arthropathy. Moderate bilateral neuroforaminal  stenosis. Mild spinal canal stenosis.   L5-S1: Disc bulge. Moderate bilateral facet arthropathy. Mild bilateral neuroforaminal stenosis. No spinal canal stenosis.   IMPRESSION: 1. Mild canal stenoses at L3-L4 and L4-L5 secondary to disc bulging and facet arthropathy. 2. Moderate foraminal stenosis bilaterally at L4-L5. 3. 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  Denies SI or HI 5) History of CVA.  Patient continues to have aphasia   Plan 1) Dc Gabapentin  previously, Increase pregabalin  (Lyrica ) to 100mg  three times daily as previously recommended to better manage fibromyalgia symptoms. 2) Previously on liquid hydrocodone , Continue Hycet 7.5/15ml, 10-15ml Q8H PRN.  Ordered. He has a hard time swallowing larger pills.  3) Initially planned to order TENS unit however will hold off due to MRI indicating possible nerve sheath tumor 4)  Increase venlafaxine  (Effexor ) for mood and potential neuropathic pain benefit. Will start by adding a 37.5 mg tablet to the current 75 mg dose, for a total of 112.5 mg daily. If tolerated, plan to increase to 150 mg at the next follow-up. 5) Continue MediQ for Aquatic PT- reports helping 6) Narcan  ordered prior visit 7) continue Flexeril  as needed 8) consider Qutenza if polyneuropathy pain does not improve with oral medications 9) MRI with Lumbar spondylosis multiple levels- consider MBB at later time as he may have component of facet mediated pain 10) continue monitor UDS, PDMP, pill counts 11) Referral to NSGY prior visit- reports no surgery or medications were planned/changed at the visit  12)Will provide a referral to psychology for talk therapy to help with mood.

## 2024-04-13 DIAGNOSIS — M797 Fibromyalgia: Secondary | ICD-10-CM | POA: Diagnosis not present

## 2024-04-13 DIAGNOSIS — M545 Low back pain, unspecified: Secondary | ICD-10-CM | POA: Diagnosis not present

## 2024-04-14 ENCOUNTER — Other Ambulatory Visit: Payer: Self-pay | Admitting: Family Medicine

## 2024-04-14 DIAGNOSIS — K649 Unspecified hemorrhoids: Secondary | ICD-10-CM

## 2024-04-15 DIAGNOSIS — M797 Fibromyalgia: Secondary | ICD-10-CM | POA: Diagnosis not present

## 2024-04-15 DIAGNOSIS — M545 Low back pain, unspecified: Secondary | ICD-10-CM | POA: Diagnosis not present

## 2024-04-16 ENCOUNTER — Other Ambulatory Visit: Payer: Self-pay | Admitting: Cardiology

## 2024-04-16 ENCOUNTER — Other Ambulatory Visit: Payer: Self-pay | Admitting: Family Medicine

## 2024-04-16 DIAGNOSIS — I10 Essential (primary) hypertension: Secondary | ICD-10-CM

## 2024-04-18 ENCOUNTER — Encounter: Payer: Self-pay | Admitting: Radiology

## 2024-05-03 ENCOUNTER — Other Ambulatory Visit: Payer: Self-pay | Admitting: Family Medicine

## 2024-05-11 ENCOUNTER — Telehealth: Payer: Self-pay

## 2024-05-11 ENCOUNTER — Other Ambulatory Visit: Payer: Self-pay | Admitting: Internal Medicine

## 2024-05-11 ENCOUNTER — Other Ambulatory Visit (HOSPITAL_COMMUNITY): Payer: Self-pay

## 2024-05-11 DIAGNOSIS — E1159 Type 2 diabetes mellitus with other circulatory complications: Secondary | ICD-10-CM

## 2024-05-11 NOTE — Telephone Encounter (Signed)
 Pharmacy Patient Advocate Encounter   Received notification from CoverMyMeds that prior authorization for FreeStyle Libre 3 Plus Sensor is required/requested.   Insurance verification completed.   The patient is insured through [facility].   Per test claim: PA required; PA submitted to above mentioned insurance via Latent Key/confirmation #/EOC [id number] Status is pending

## 2024-05-23 ENCOUNTER — Encounter: Admitting: Physical Medicine & Rehabilitation

## 2024-05-23 ENCOUNTER — Telehealth: Payer: Self-pay | Admitting: Physical Medicine & Rehabilitation

## 2024-05-23 NOTE — Telephone Encounter (Signed)
 P rescheduled today due to weather. Needs a refill of hydrocodone  sent to [facility]

## 2024-05-24 MED ORDER — HYDROCODONE-ACETAMINOPHEN 7.5-325 MG/15ML PO SOLN: 10.0000 mL | Freq: Three times a day (TID) | ORAL | 0 refills | Status: DC | PRN

## 2024-05-28 ENCOUNTER — Encounter: Payer: Self-pay | Admitting: Physical Medicine & Rehabilitation

## 2024-05-28 DIAGNOSIS — G894 Chronic pain syndrome: Secondary | ICD-10-CM

## 2024-05-28 DIAGNOSIS — M545 Low back pain, unspecified: Secondary | ICD-10-CM

## 2024-05-29 ENCOUNTER — Other Ambulatory Visit: Payer: Self-pay | Admitting: Internal Medicine

## 2024-05-31 ENCOUNTER — Other Ambulatory Visit: Payer: Self-pay | Admitting: Family Medicine

## 2024-06-06 NOTE — Telephone Encounter (Signed)
 Pharmacy Patient Advocate Encounter  Received notification from [employer] that Prior Authorization for FreeStyle Libre 3 Plus Sensor  has been APPROVED from 05/11/2024 to 05/11/2025   PA #/Case ID/Reference #: [id number]

## 2024-06-07 ENCOUNTER — Other Ambulatory Visit: Payer: Self-pay | Admitting: Family Medicine

## 2024-06-07 ENCOUNTER — Ambulatory Visit: Admitting: Internal Medicine

## 2024-06-07 DIAGNOSIS — E785 Hyperlipidemia, unspecified: Secondary | ICD-10-CM

## 2024-06-13 ENCOUNTER — Ambulatory Visit: Admitting: Internal Medicine

## 2024-06-13 ENCOUNTER — Encounter: Payer: Self-pay | Admitting: Internal Medicine

## 2024-06-13 VITALS — BP 130/70 | HR 91 | Ht 69.5 in | Wt 172.0 lb

## 2024-06-13 DIAGNOSIS — E1165 Type 2 diabetes mellitus with hyperglycemia: Secondary | ICD-10-CM

## 2024-06-13 DIAGNOSIS — Z7985 Long-term (current) use of injectable non-insulin antidiabetic drugs: Secondary | ICD-10-CM

## 2024-06-13 DIAGNOSIS — E1159 Type 2 diabetes mellitus with other circulatory complications: Secondary | ICD-10-CM | POA: Diagnosis not present

## 2024-06-13 DIAGNOSIS — E785 Hyperlipidemia, unspecified: Secondary | ICD-10-CM

## 2024-06-13 DIAGNOSIS — Z7984 Long term (current) use of oral hypoglycemic drugs: Secondary | ICD-10-CM

## 2024-06-13 LAB — POCT GLYCOSYLATED HEMOGLOBIN (HGB A1C): Hemoglobin A1C: 7 % — AB (ref 4.0–5.6)

## 2024-06-13 NOTE — Progress Notes (Signed)
 Patient ID: KELLIE CHISOLM, male   DOB: 1960-11-10, 63 y.o.   MRN: [id number]  HPI: DEBRA CALABRETTA is a 63 y.o.-year-old male, presenting for follow-up for DM2, dx in 2014, non-insulin -dependent, uncontrolled, with complications (PVD, cerebro-vascular ds - s/p ministrokes, PN). Last visit 4 months ago. He had [employer], now [place] only.  Interim history: No blurry vision, increased urination. He continues to have memory loss and difficulty word finding. He also continues to have chronic generalized pain and fibromyalgia.   HbA1c levels reviewed: Lab Results  Component Value Date   HGBA1C 7.0 (A) 02/05/2024   HGBA1C 7.1 (A) 10/08/2023   HGBA1C 7.6 (A) 02/02/2023   HGBA1C 7.8 (A) 09/19/2022   HGBA1C 8.2 (A) 05/20/2022   HGBA1C 6.5 (A) 01/14/2022   HGBA1C 6.7 (A) 09/10/2021   HGBA1C 6.7 (A) 06/03/2021   HGBA1C 6.2 (A) 01/03/2021   HGBA1C 6.7 (A) 09/10/2020   HGBA1C 8.2 (H) 05/30/2020   HGBA1C 8.3 (H) 05/24/2020   HGBA1C 9.7 (H) 04/05/2020   HGBA1C 8.3 (H) 05/24/2019   HGBA1C 7.9 (A) 08/04/2018   HGBA1C 7.5 (A) 12/21/2017   HGBA1C 7.4 09/17/2017   HGBA1C 7.8 06/19/2017   HGBA1C 7.1 (H) 02/19/2017   HGBA1C 7.4 02/21/2016  06/05/2023: HbA1c 8.1%  Pt is on a regimen of:  - Metformin  500 mg 1x a day >> 2x >> 1x a day b/c problems with swallowing >> 1-2 >> 2x a day - Jardiance  25 mg at night >> before breakfast - Ozempic  0.5 >> 1 mg weekly In 11/2018, we tried to change from Januvia  to Ozempic  but this was not covered. Stopped Glipizide  b/c rash. Stopped Invokana  100 mg in am >> only got this for 1 mo He was on Metformin  500 mg po bid - in liquid form, as he could not swallow pills. He gets gi upset. He stopped this in 2014. He was previously on Januvia , then Victoza .  His fingerstick values are not accurate due to Raynaud's syndrome - now checking sugars more than 4 times a day with his CGM:  Previously:     Lowest sugar was 80s >>  >100 >> 70s >> 60s (not  recently, on a recalled sensor).;  he has hypoglycemia awareness in the 70s. Highest sugar was 250 >> 200 >> 250s >> 282.  Glucometer: [employer] Next >> AccuChek guide  In the past, he was occasional lemonade and ginger ale >> advised to stop - still drinking these but rarely.  -+ Mild CKD, last BUN/creatinine:  Lab Results  Component Value Date   BUN 13 11/27/2023   BUN 8 02/17/2023   CREATININE 0.93 11/27/2023   CREATININE 1.06 02/17/2023   Lab Results  Component Value Date   MICRALBCREAT 10 11/27/2023   MICRALBCREAT 9 06/05/2023   -+ HL.  last set of lipids: Lab Results  Component Value Date   CHOL 122 11/27/2023   HDL 35 (L) 11/27/2023   LDLCALC 67 11/27/2023   LDLDIRECT 59.0 12/22/2022   TRIG 112 11/27/2023   CHOLHDL 3.5 11/27/2023  On pravastatin  40.  - last eye exam was in 2025: No DR reportedly.  Dr. Madelyn.   - He has numbness and tingling in his feet, also pain.  Foot exam performed here in clinic 02/05/2024.    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 Has varicose veins >> wears compresion hoses.He also has a h/o kidney stones.  On allopurinol  for gout. He has anxiety, PTSD from childhood.  Early in 2022, he was diagnosed with an intradural extramedullary spinal tumor.  It did not change in appearance on a recent MRI. He had prostatitis and UTIs. He is on Tamsulozin (changed from terazosin) and finasteride .  He was on nitrofurantoin.  ROS: + See HPI.  Past Medical History:  Diagnosis Date   Abnormality of gait 11/10/2012   Acute GI bleeding    Acute renal insufficiency 02/10/2015   Acute upper respiratory infection 08/10/2012   Allergic drug reaction 05/24/2020   Allergic rhinitis 10/16/2020   Allergy    ANXIETY DEPRESSION 12/09/2007   Qualifier: Diagnosis of  By: Charlsie RN, Angeline MATSU    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 10/17/2014   Arthralgia 07/25/2010   Arthritis    Benign  neoplasm of brain (HCC) 05/24/2019   Bigeminy ? 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) 03/12/2018   Cerebrovascular accident, old 11/16/2014   Chest discomfort 06/08/2019   Chronic back pain 11/28/2020   Chronic cough 08/27/2014   CXR 08/2014:  Mild hyperinflation, no acute process Spiro 09/2014:  Difficulty with procedure, but essentially normal +CVA's in past, with aspiration risk by history Speech evaluation 2016:      Chronic left shoulder pain 08/06/2020   Chronic pain syndrome 11/23/2019   Chronic pansinusitis 06/30/2018   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   Constipation    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   DDD (degenerative disc disease), cervical 04/26/2019   DDD (degenerative disc disease), lumbar 04/26/2019   Decreased hearing of both ears 02/12/2013   Deviated septum 05/31/2018   Diabetes mellitus without complication (HCC)    Difficulty urinating    DM (diabetes mellitus) type II uncontrolled, periph vascular disorder 01/02/2016   Dry skin 05/09/2019   Dysuria 11/29/2010   Essential (primary) hypertension 01/26/2015   Excessive daytime sleepiness 02/11/2018   FATIGUE 12/09/2007   Qualifier: Diagnosis of  By: Charlsie RN, Angeline MATSU    Fibromyalgia 03/02/2017   Food intolerance 05/09/2019   GERD 12/09/2007   Qualifier: Diagnosis of  By: Viviann Raddle MD, Marsha SAUNDERS    GERD (gastroesophageal reflux disease)    GI bleed 02/10/2015   Headache 12/23/2006   Qualifier: Diagnosis of   By: Charlsie OBIE Angeline MATSU     IMO SNOMED Dx Update Oct 2024     Hearing loss    Hemangioma 02/12/2013   Hemorrhoid 05/22/2011   HTN (hypertension) 02/04/2010   Qualifier: Diagnosis of  By:  Domenica MD, Stacey     Hyperlipidemia    Hyperlipidemia associated with type 2 diabetes mellitus (HCC) 11/22/2020   Hyperlipidemia LDL goal <70 01/26/2015   Hypersomnia, persistent 11/05/2012   epworth of 18 points, was placed on CPAP in 2008 with very low AHI ( no number  Mentioned) and struggles ever since with PAP. 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   Increasing residual urine 03/10/2022   Insect bite 03/16/2018   Insomnia 08/05/2011   Internal and  external hemorrhoids without complication 10/04/2012   Internal hemorrhoids 10/22/2014   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   Leg swelling    Localized swelling of both lower legs 03/02/2017   Low back pain 07/20/2007   Qualifier: Diagnosis of  By: Viviann Raddle MD, Marsha SAUNDERS    Lower extremity edema 07/04/2020   Lower GI bleed    Melena 12/16/2021   Mixed conductive and sensorineural hearing loss of right ear with restricted hearing of left ear 10/16/2020   Nasal congestion    Neck pain 03/15/2020   NEPHROLITHIASIS 08/13/2006   Qualifier: Diagnosis of  By: Geralene Service     Nocturia more than twice per night 02/11/2018   OSA on CPAP 10/17/2014   Otalgia of both ears 06/06/2021   Other chronic pain 03/02/2017   Other fatigue 07/04/2020   Pain in finger of right hand 10/06/2012   Pain in joint of right shoulder 02/21/2017   Palpitations 01/26/2015   Paresthesia 07/25/2010   Persistent atrial fibrillation (HCC) 08/29/2020   Persistent headaches 10/17/2014   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   Qualifier: Diagnosis of  By: Viviann Raddle MD, Marsha R    PONV (postoperative nausea and vomiting)    Preoperative clearance 07/04/2020   Preventative health care 11/26/2011   Prolapsed hemorrhoids 10/05/2013   Radiculopathy, lumbar region 03/15/2020   Rash 05/24/2019   Rash and other  nonspecific skin eruption 07/23/2022   Raynaud's disease 03/02/2017   Raynaud's phenomenon 03/02/2017   Rectal bleeding 02/12/2015   Rectal pain    Rectal ulcer with bleeding after hemorrhoid banding 02/11/2015   Rhinitis, chronic 12/23/2006   Qualifier: Diagnosis of  By: Charlsie RN, Regina G    Right shoulder pain 02/15/2015   Seasonal and perennial allergic rhinitis 05/09/2019   Severe episode of recurrent major depressive disorder, 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   Sinus infection 06/30/2014   Sinusitis, acute, maxillary 02/02/2014   Sleep apnea    Sleep apnea with use of continuous positive airway pressure (CPAP) 02/11/2018   SOB (shortness of breath) 07/04/2020   Stroke (HCC)    Stroke (HCC)    Subacute confusional state 10/17/2014   SYMPTOM, HYPERSOMNIA NOS 02/19/2007   Qualifier: Diagnosis of  By: Winona Raring     Thrombosed hemorrhoids    Tick bite of back 11/23/2019   Tinnitus of both ears 08/29/2020   Trouble swallowing    Unspecified hemorrhoids 02/10/2015   UTI (urinary tract infection) 05/19/2022   Varicose veins of bilateral lower extremities with other complications 08/10/2012   IMO SNOMED Dx Update Oct 2024     Vasomotor rhinitis 06/28/2021   Vertigo 03/19/2012   Past Surgical History:  Procedure Laterality Date   CATARACT EXTRACTION     x 3   EYE SURGERY  1968, 1985, 1987   cataracts   FLEXIBLE SIGMOIDOSCOPY N/A 02/11/2015   Procedure: FLEXIBLE SIGMOIDOSCOPY;  Surgeon: Lupita FORBES Commander, MD;  Location: [facility];  Service: Endoscopy;  Laterality: N/A;   INNER EAR SURGERY     rt ear   INNER EAR SURGERY  1992   surgery - right arm     1983   Social History   Socioeconomic History   Marital status: Single    Spouse name: Not on file   Number of children: 0   Years of education: Not on file   Highest education level: 12th grade  Occupational History   Not on file  Tobacco Use   Smoking status: Never  Smokeless tobacco: Never  Vaping  Use   Vaping status: Never Used  Substance and Sexual Activity   Alcohol  use: No    Alcohol /week: 0.0 standard drinks of alcohol     Comment: none   Drug use: No   Sexual activity: Never  Other Topics Concern   Not on file  Social History Narrative   No caffeine intake except for chocolate.  Exercised-less walking    Social Drivers of Health   Tobacco Use: Low Risk (04/12/2024)   Patient History    Smoking Tobacco Use: Never    Smokeless Tobacco Use: Never    Passive Exposure: Not on file  Financial Resource Strain: Patient Declined (11/25/2023)   Overall Financial Resource Strain (CARDIA)    Difficulty of Paying Living Expenses: Patient declined  Food Insecurity: Food Insecurity Present (04/08/2024)   Epic    Worried About Programme Researcher, Broadcasting/film/video in the Last Year: Sometimes true    [employer] of Food in the Last Year: Sometimes true  Transportation Needs: Unmet Transportation Needs (02/07/2024)   Epic    Lack of Transportation (Medical): Yes    Lack of Transportation (Non-Medical): Not on file  Physical Activity: Unknown (02/07/2024)   Exercise Vital Sign    Days of Exercise per Week: 1 day    Minutes of Exercise per Session: Not on file  Stress: Stress Concern Present (02/07/2024)   [employer] of Occupational Health - Occupational Stress Questionnaire    Feeling of Stress: To some extent  Social Connections: Unknown (02/07/2024)   Social Connection and Isolation Panel    Frequency of Communication with Friends and Family: Not on file    Frequency of Social Gatherings with Friends and Family: Not on file    Attends Religious Services: Not on file    Active Member of Clubs or Organizations: No    Attends Banker Meetings: Not on file    Marital Status: Not on file  Intimate Partner Violence: Not on file  Depression (PHQ2-9): Low Risk (04/12/2024)   Depression (PHQ2-9)    PHQ-2 Score: 2  Recent Concern: Depression (PHQ2-9) - High Risk (02/01/2024)   Depression  (PHQ2-9)    PHQ-2 Score: 12  Alcohol  Screen: Not on file  Housing: Unknown (02/07/2024)   Epic    Unable to Pay for Housing in the Last Year: Patient declined    Number of Times Moved in the Last Year: 0    Homeless in the Last Year: No  Utilities: Not on file  Health Literacy: Not on file   Current Outpatient Medications on File Prior to Visit  Medication Sig Dispense Refill   Accu-Chek Softclix Lancets lancets Use as instructed 1x a day 100 each PRN   allopurinol  (ZYLOPRIM ) 100 MG tablet TAKE 1 TABLET BY MOUTH TWICE A DAY 60 tablet 2   amLODipine  (NORVASC ) 2.5 MG tablet Take 1 tablet (2.5 mg total) by mouth daily. 90 tablet 1   Azelastine  HCl (ASTEPRO ) 0.15 % SOLN Place 1 spray into the nose in the morning and at bedtime. 1 mL 3   Azelastine  HCl 137 MCG/SPRAY SOLN PLACE 1 SPRAY INTO BOTH NOSTRILS 2 (TWO) TIMES DAILY. USE IN EACH NOSTRIL AS DIRECTED 30 mL 6   BD PEN NEEDLE NANO 2ND GEN 32G X 4 MM MISC USE ONCE DAILY AS DIRECTED 100 each 3   clotrimazole -betamethasone  (LOTRISONE ) cream Apply 1 application  topically as needed for rash. (Patient not taking: Reported on 02/08/2024)     clotrimazole -betamethasone  (LOTRISONE )  cream Apply 1 Application topically daily. 30 g 0   Continuous Glucose Sensor (FREESTYLE LIBRE 3 PLUS SENSOR) MISC INJECT 1 DEVICE INTO THE SKIN CONTINUOUS. CHANGE EVERY 15 DAYS 2 each 11   cyclobenzaprine  (FLEXERIL ) 10 MG tablet TAKE 1 TABLET BY MOUTH TWICE A DAY AS NEEDED FOR MUSCLE SPASM 42 tablet 2   empagliflozin  (JARDIANCE ) 25 MG TABS tablet Take 1 tablet (25 mg total) by mouth daily before breakfast.     EPINEPHrine  0.3 mg/0.3 mL IJ SOAJ injection Inject 0.3 mg into the muscle as needed for anaphylaxis. 2 each 0   famotidine  (PEPCID ) 40 MG/5ML suspension Take 2.5 mLs (20 mg total) by mouth daily. Disregard after 30 days 100 mL 2   FIBER SELECT GUMMIES PO Take 1 tablet by mouth daily.     finasteride  (PROSCAR ) 5 MG tablet Take 5 mg by mouth daily.     glucose blood  (ACCU-CHEK GUIDE) test strip Use as instructed 1x a day - 100 each PRN   HYDROcodone -acetaminophen  (HYCET) 7.5-325 mg/15 ml solution Take 10-15 mLs by mouth every 8 (eight) hours as needed for moderate pain (pain score 4-6). 1200 mL 0   hydrocortisone  (ANUSOL -HC) 2.5 % rectal cream PLACE 1 APPLICATION RECTALLY 2 (TWO) TIMES DAILY. 30 g 2   hydrOXYzine  (ATARAX ) 25 MG tablet Take 1 tablet (25 mg total) by mouth every 8 (eight) hours as needed for anxiety or itching. 90 tablet 1   loratadine  (CLARITIN ) 10 MG tablet Take 1 tablet (10 mg total) by mouth daily. 30 tablet 0   losartan  (COZAAR ) 100 MG tablet Take 1 tablet (100 mg total) by mouth daily. 90 tablet 0   metFORMIN  (GLUCOPHAGE ) 500 MG tablet TAKE 1 TABLET BY MOUTH 2 TIMES DAILY WITH A MEAL. 180 tablet 1   metoprolol  tartrate (LOPRESSOR ) 50 MG tablet Take 1 tablet (50 mg total) by mouth daily. 90 tablet 0   naloxone  (NARCAN ) nasal spray 4 mg/0.1 mL In case of opioid overdose 2 each 1   polyethylene glycol powder (GAVILAX) 17 GM/SCOOP powder Take 17 g by mouth daily as needed for moderate constipation. 238 g 5   pravastatin  (PRAVACHOL ) 20 MG tablet Take 2 tablets (40 mg total) by mouth daily. 180 tablet 0   pregabalin  (LYRICA ) 100 MG capsule Take 1 capsule (100 mg total) by mouth 3 (three) times daily. 90 capsule 4   promethazine -dextromethorphan (PROMETHAZINE -DM) 6.25-15 MG/5ML syrup Take 5 mLs by mouth 4 (four) times daily as needed. 118 mL 0   Semaglutide , 1 MG/DOSE, (OZEMPIC , 1 MG/DOSE,) 4 MG/3ML SOPN INJECT 1MG  INTO THE SKIN ONCE A WEEK 3 mL 11   tamsulosin  (FLOMAX ) 0.4 MG CAPS capsule Take 0.4 mg by mouth daily.     triamcinolone  (NASACORT ) 55 MCG/ACT AERO nasal inhaler Place 2 sprays into the nose daily. 1 each 12   venlafaxine  XR (EFFEXOR  XR) 37.5 MG 24 hr capsule Take 1 capsule (37.5 mg total) by mouth daily with breakfast. 90 capsule 2   venlafaxine  XR (EFFEXOR -XR) 75 MG 24 hr capsule Take 1 capsule (75 mg total) by mouth daily with  breakfast. 90 capsule 1   No current facility-administered medications on file prior to visit.   Allergies  Allergen Reactions   Amoxicillin Hives, Itching and Other (See Comments)    White tongue; ? hives   Sulfa  Antibiotics Hives and Rash   Terfenadine     ? reaction   Family History  Problem Relation Age of Onset   Breast cancer Mother 40  breast   Huntington's disease Mother    Stroke Father    Heart disease Father    Hypertension Father    Heart attack Father    Hypertension Brother    Hyperlipidemia Neg Hx    Diabetes Neg Hx    PE: BP 130/70   Pulse 91   Ht 5' 9.5 (1.765 m)   Wt 172 lb (78 kg)   SpO2 97%   BMI 25.04 kg/m  Wt Readings from Last 10 Encounters:  06/13/24 172 lb (78 kg)  04/12/24 168 lb 6.4 oz (76.4 kg)  02/08/24 169 lb 6.4 oz (76.8 kg)  02/05/24 171 lb 12.8 oz (77.9 kg)  02/01/24 172 lb 3.2 oz (78.1 kg)  12/07/23 170 lb 6.4 oz (77.3 kg)  11/27/23 170 lb 6.4 oz (77.3 kg)  10/19/23 174 lb 6.4 oz (79.1 kg)  10/13/23 171 lb 1.9 oz (77.6 kg)  10/08/23 169 lb 12.8 oz (77 kg)   Constitutional: Slightly overweight, in NAD Eyes: EOMI, no exophthalmos ENT: no thyromegaly, no cervical lymphadenopathy Cardiovascular: RRR, No MRG, + mild swelling and pain on palpation in B LE  -wears compression hoses Respiratory: CTA B Musculoskeletal: no deformities Skin: No rashes Neurological: no tremor with outstretched hands  ASSESSMENT: 1. DM2, non-insulin -dependent, uncontrolled, with complications - PVD - cerebro-vascular ds - s/p ministrokes - PN  2. HL  PLAN:  1. Patient with history of uncontrolled type 2 diabetes, on metformin , SGLT2 inhibitor and weekly GLP-1 receptor agonist, with fairly good control whenever he has access to the above regimen.  We had many problems in the past with insurance is not covering his medicines and his blood sugar control fluctuated.  HbA1c at last visit was 7.0%, decreased from 7.1%.  Sugars were worse in the  months prior to our last visit and upon questioning, he had a sinus infection and he was having more snacks, also.  We discussed about which snacks to cut out and why.  I gave him examples of healthier snacks and I also referred him to nutrition as he was interested in this - he had this appointment 2 months ago.  I did not change his regimen at that time.  GM interpretation: -At today's visit, we reviewed his CGM downloads: It appears that 81% of values are in target range (goal >70%), while 98% are higher than 180 (goal <25%), and 0% are lower than 70 (goal <4%).  The calculated average blood sugar is 151.  The projected HbA1c for the next 3 months (GMI) is 6.9%. -Reviewing the CGM trends, sugars appear to be fluctuating within the target range, improved from previously, despite the holidays.  He did have some higher blood sugars after Christmas and then some of the other days now during the holidays, but with overall blood sugars at goal.  Therefore, at today's visit, we will not change his regimen. - I suggested to:  Patient Instructions  Please continue: - Metformin  500 mg 1-2x a day - Jardiance  25 mg before b'fast - Ozempic  1 mg weekly  Please ask your eye Dr. to send me your annual eye exams at: [phone].  Please return in 4 months.  - we checked his HbA1c: 7.0% (stable) - advised to check sugars at different times of the day - 4x a day, rotating check times - advised for yearly eye exams >> he is UTD - return to clinic in 4 months  2. HL - Latest lipid panel was reviewed from 11/2023: LDL above our target  of less than 55, HDL slightly low: Lab Results  Component Value Date   CHOL 122 11/27/2023   HDL 35 (L) 11/27/2023   LDLCALC 67 11/27/2023   LDLDIRECT 59.0 12/22/2022   TRIG 112 11/27/2023   CHOLHDL 3.5 11/27/2023  -He continues on pravastatin  40 mg daily without side effects  Lela Fendt, MD PhD [facility]

## 2024-06-13 NOTE — Patient Instructions (Addendum)
 Please continue: - Metformin  500 mg 1-2x a day - Jardiance  25 mg before b'fast - Ozempic  1 mg weekly  Please ask your eye Dr. to send me your annual eye exams at: [phone].  Please return in 4 months.

## 2024-06-14 ENCOUNTER — Other Ambulatory Visit: Payer: Self-pay | Admitting: Family Medicine

## 2024-06-14 MED ORDER — HYDROCODONE-ACETAMINOPHEN 7.5-325 MG/15ML PO SOLN: 10.0000 mL | Freq: Three times a day (TID) | ORAL | 0 refills | Status: DC | PRN

## 2024-06-18 MED ORDER — HYDROCODONE-ACETAMINOPHEN 7.5-325 MG PO TABS: 1.0000 | ORAL_TABLET | Freq: Three times a day (TID) | ORAL | 0 refills | Status: AC | PRN

## 2024-07-11 ENCOUNTER — Encounter: Admitting: Physical Medicine & Rehabilitation

## 2024-07-19 ENCOUNTER — Encounter: Payer: Self-pay | Admitting: Physical Medicine & Rehabilitation

## 2024-07-19 ENCOUNTER — Encounter: Admitting: Physical Medicine & Rehabilitation

## 2024-10-10 ENCOUNTER — Ambulatory Visit: Admitting: Internal Medicine
